# Patient Record
Sex: Male | Born: 1955 | Race: Black or African American | Hispanic: No | Smoking: Current every day smoker
Health system: Southern US, Community
[De-identification: ages and names within clinical notes are randomized; demographics above are authoritative.]

## PROBLEM LIST (undated history)

## (undated) DIAGNOSIS — L02413 Cutaneous abscess of right upper limb: Secondary | ICD-10-CM

## (undated) DIAGNOSIS — H3552 Pigmentary retinal dystrophy: Secondary | ICD-10-CM

## (undated) DIAGNOSIS — R0602 Shortness of breath: Secondary | ICD-10-CM

## (undated) DIAGNOSIS — C8598 Non-Hodgkin lymphoma, unspecified, lymph nodes of multiple sites: Secondary | ICD-10-CM

## (undated) HISTORY — DX: Cutaneous abscess of right upper limb: L02.413

## (undated) HISTORY — DX: Non-Hodgkin lymphoma, unspecified, lymph nodes of multiple sites: C85.98

## (undated) HISTORY — PX: OTHER SURGICAL HISTORY: SHX169

## (undated) HISTORY — DX: Shortness of breath: R06.02

## (undated) HISTORY — PX: TONSILLECTOMY: SUR1361

---

## 2015-10-24 ENCOUNTER — Encounter (HOSPITAL_COMMUNITY): Payer: Self-pay | Admitting: Family Medicine

## 2015-10-24 ENCOUNTER — Emergency Department (HOSPITAL_COMMUNITY): Payer: Medicare Other

## 2015-10-24 ENCOUNTER — Observation Stay (HOSPITAL_COMMUNITY)
Admission: EM | Admit: 2015-10-24 | Discharge: 2015-10-25 | Disposition: A | Discharge status: Home / Self Care | Payer: Medicare Other | Attending: Internal Medicine | Admitting: Internal Medicine

## 2015-10-24 DIAGNOSIS — R591 Generalized enlarged lymph nodes: Secondary | ICD-10-CM | POA: Diagnosis not present

## 2015-10-24 DIAGNOSIS — E8809 Other disorders of plasma-protein metabolism, not elsewhere classified: Secondary | ICD-10-CM | POA: Diagnosis not present

## 2015-10-24 DIAGNOSIS — R918 Other nonspecific abnormal finding of lung field: Secondary | ICD-10-CM | POA: Diagnosis not present

## 2015-10-24 DIAGNOSIS — F17200 Nicotine dependence, unspecified, uncomplicated: Secondary | ICD-10-CM | POA: Diagnosis not present

## 2015-10-24 DIAGNOSIS — H3552 Pigmentary retinal dystrophy: Secondary | ICD-10-CM | POA: Diagnosis not present

## 2015-10-24 DIAGNOSIS — Z7982 Long term (current) use of aspirin: Secondary | ICD-10-CM | POA: Insufficient documentation

## 2015-10-24 DIAGNOSIS — R748 Abnormal levels of other serum enzymes: Secondary | ICD-10-CM | POA: Diagnosis present

## 2015-10-24 DIAGNOSIS — N401 Enlarged prostate with lower urinary tract symptoms: Secondary | ICD-10-CM | POA: Diagnosis not present

## 2015-10-24 DIAGNOSIS — C8598 Non-Hodgkin lymphoma, unspecified, lymph nodes of multiple sites: Secondary | ICD-10-CM

## 2015-10-24 DIAGNOSIS — H548 Legal blindness, as defined in USA: Secondary | ICD-10-CM | POA: Diagnosis not present

## 2015-10-24 DIAGNOSIS — R109 Unspecified abdominal pain: Secondary | ICD-10-CM | POA: Diagnosis not present

## 2015-10-24 DIAGNOSIS — C801 Malignant (primary) neoplasm, unspecified: Secondary | ICD-10-CM

## 2015-10-24 DIAGNOSIS — R21 Rash and other nonspecific skin eruption: Secondary | ICD-10-CM | POA: Diagnosis not present

## 2015-10-24 DIAGNOSIS — R6881 Early satiety: Secondary | ICD-10-CM | POA: Insufficient documentation

## 2015-10-24 DIAGNOSIS — K4020 Bilateral inguinal hernia, without obstruction or gangrene, not specified as recurrent: Secondary | ICD-10-CM | POA: Diagnosis not present

## 2015-10-24 DIAGNOSIS — R339 Retention of urine, unspecified: Secondary | ICD-10-CM | POA: Diagnosis present

## 2015-10-24 DIAGNOSIS — R338 Other retention of urine: Secondary | ICD-10-CM | POA: Insufficient documentation

## 2015-10-24 DIAGNOSIS — R1010 Upper abdominal pain, unspecified: Secondary | ICD-10-CM | POA: Diagnosis not present

## 2015-10-24 DIAGNOSIS — R739 Hyperglycemia, unspecified: Secondary | ICD-10-CM | POA: Diagnosis not present

## 2015-10-24 DIAGNOSIS — K4090 Unilateral inguinal hernia, without obstruction or gangrene, not specified as recurrent: Secondary | ICD-10-CM

## 2015-10-24 HISTORY — DX: Pigmentary retinal dystrophy: H35.52

## 2015-10-24 LAB — COMPREHENSIVE METABOLIC PANEL
ALK PHOS: 84 U/L (ref 38–126)
ALT: 16 U/L — AB (ref 17–63)
AST: 24 U/L (ref 15–41)
Albumin: 2.9 g/dL — ABNORMAL LOW (ref 3.5–5.0)
Anion gap: 11 (ref 5–15)
BILIRUBIN TOTAL: 0.4 mg/dL (ref 0.3–1.2)
BUN: 11 mg/dL (ref 6–20)
CALCIUM: 9 mg/dL (ref 8.9–10.3)
CO2: 25 mmol/L (ref 22–32)
CREATININE: 1.1 mg/dL (ref 0.61–1.24)
Chloride: 101 mmol/L (ref 101–111)
GFR calc Af Amer: >60 mL/min (ref >60)
Glucose, Bld: 178 mg/dL — ABNORMAL HIGH (ref 65–99)
Potassium: 4.2 mmol/L (ref 3.5–5.1)
Sodium: 137 mmol/L (ref 135–145)
TOTAL PROTEIN: 6.2 g/dL — AB (ref 6.5–8.1)

## 2015-10-24 LAB — CBC
HCT: 40.9 % (ref 39.0–52.0)
Hemoglobin: 13.1 g/dL (ref 13.0–17.0)
MCH: 27.2 pg (ref 26.0–34.0)
MCHC: 32 g/dL (ref 30.0–36.0)
MCV: 84.9 fL (ref 78.0–100.0)
PLATELETS: 222 10*3/uL (ref 150–400)
RBC: 4.82 MIL/uL (ref 4.22–5.81)
RDW: 15.5 % (ref 11.5–15.5)
WBC: 9.1 10*3/uL (ref 4.0–10.5)

## 2015-10-24 LAB — URINALYSIS, ROUTINE W REFLEX MICROSCOPIC
BILIRUBIN URINE: NEGATIVE
Glucose, UA: NEGATIVE mg/dL
Hgb urine dipstick: NEGATIVE
KETONES UR: NEGATIVE mg/dL
LEUKOCYTES UA: NEGATIVE
NITRITE: NEGATIVE
PROTEIN: 30 mg/dL — AB
Specific Gravity, Urine: 1.024 (ref 1.005–1.030)
pH: 5 (ref 5.0–8.0)

## 2015-10-24 LAB — URINE MICROSCOPIC-ADD ON

## 2015-10-24 LAB — LIPASE, BLOOD: Lipase: 88 U/L — ABNORMAL HIGH (ref 11–51)

## 2015-10-24 MED ORDER — ONDANSETRON HCL 4 MG PO TABS: 4.0000 mg | ORAL_TABLET | Freq: Four times a day (QID) | ORAL | Status: DC | PRN

## 2015-10-24 MED ORDER — IOHEXOL 300 MG/ML  SOLN
100.0000 mL | Freq: Once | INTRAMUSCULAR | Status: AC | PRN
  Administered 2015-10-24: 100 mL via INTRAVENOUS

## 2015-10-24 MED ORDER — SODIUM CHLORIDE 0.9 % IV SOLN: INTRAVENOUS | Status: DC

## 2015-10-24 MED ORDER — MORPHINE SULFATE (PF) 2 MG/ML IV SOLN: 1.0000 mg | INTRAVENOUS | Status: DC | PRN

## 2015-10-24 MED ORDER — INSULIN ASPART 100 UNIT/ML ~~LOC~~ SOLN: 0.0000 [IU] | SUBCUTANEOUS | Status: DC

## 2015-10-24 MED ORDER — HEPARIN SODIUM (PORCINE) 5000 UNIT/ML IJ SOLN: 5000.0000 [IU] | Freq: Three times a day (TID) | INTRAMUSCULAR | Status: DC

## 2015-10-24 MED ORDER — IOHEXOL 300 MG/ML  SOLN
75.0000 mL | Freq: Once | INTRAMUSCULAR | Status: AC | PRN
  Administered 2015-10-24: 75 mL via INTRAVENOUS

## 2015-10-24 MED ORDER — ACETAMINOPHEN 650 MG RE SUPP
650.0000 mg | Freq: Four times a day (QID) | RECTAL | Status: DC | PRN
Start: 2015-10-24 — End: 2015-10-25

## 2015-10-24 MED ORDER — ONDANSETRON HCL 4 MG/2ML IJ SOLN: 4.0000 mg | Freq: Four times a day (QID) | INTRAMUSCULAR | Status: DC | PRN

## 2015-10-24 MED ORDER — ACETAMINOPHEN 325 MG PO TABS: 650.0000 mg | ORAL_TABLET | Freq: Four times a day (QID) | ORAL | Status: DC | PRN

## 2015-10-24 MED ORDER — SODIUM CHLORIDE 0.9 % IV BOLUS (SEPSIS)
500.0000 mL | Freq: Once | INTRAVENOUS | Status: AC
  Administered 2015-10-25: 500 mL via INTRAVENOUS

## 2015-10-24 NOTE — ED Notes (Signed)
Patient transported to CT 

## 2015-10-24 NOTE — Progress Notes (Addendum)
[facility] consulted to assist with pcp issue.  Patient listed as having Medicare insurance without a pcp.  [facility] faxed list of pcps to [facility] ED at [phone], who accept Medicare insurance within a 5 mile radius of patient's zip code with confirmation of receipt.  Also sent email to cancer center requesting appointment ASAP.  No further EDCM needs at this time.

## 2015-10-24 NOTE — H&P (Addendum)
[facility] History and Physical  AGNEW ZINCK [id number] DOB: [birth year] DOA: 10/24/2015  Referring physician: ED PCP: No PCP Per Patient   Chief Complaint: Abdominal pain  HPI:  Mr. Kristopher Johnson is a 60 year old male with no significant past medical history as he has not seen a doctor in years; presents with the complaints of abdominal pain, difficulty urinating, and rash. Symptoms started sometime around Thanksgiving. Notes having early satiety and discomfort in the upper abdomen. Along the same time that the symptoms occurred he noted having a rash showed up along his right arm and back. Notes trying to clean the area in efforts to get it to heal, but it has not done so. Associated symptoms include swelling in the right side of scrotum area and difficulty with urination. He complains that he has to strain in order to initiate urination. Also reports never feeling like he is completely emptying his bladder but declines having a urinary cath being placed. Intermittently has a sharp pain that runs down the right inguinal area. Complains the right inguinal intermittently gets hard and he has to push it back in. Denies any fevers, chills, chest pain, shortness of breath, or diarrhea. Over the last few months he has lost significant amount of weight. He reports weighing around 210 pounds but currently is sitting somewhere around 185. Patient denies any IV drug, alcohol, or tobacco use.   Upon admission into the emergency department, initial labwork was unremarkable. CT scan of the abdomen revealed multiple areas of enlarge lymph nodes suggestive of lymphoma. ED physician consulted oncology who recommended admission as the lymphoma could be aggressive in nature and would like to have biopsy done in the a.m. if possible.  Review of Systems  Constitutional: Positive for weight loss and malaise/fatigue.  HENT: Negative for hearing loss and tinnitus.   Eyes: Negative for double vision and  photophobia.  Respiratory: Negative for hemoptysis and shortness of breath.   Cardiovascular: Negative for chest pain and palpitations.  Gastrointestinal: Positive for abdominal pain.  Genitourinary: Negative for urgency and hematuria.       Difficulty starting a stream  Musculoskeletal: Negative for joint pain and falls.  Skin: Positive for rash.  Neurological: Negative for speech change and focal weakness.  Endo/Heme/Allergies: Negative for environmental allergies. Does not bruise/bleed easily.      History reviewed. No pertinent past medical history.   Past Surgical History  Procedure Laterality Date  . Tonsillectomy        Social History:  reports that he has been smoking.  He does not have any smokeless tobacco history on file. He reports that he does not drink alcohol. His drug history is not on file.   No Known Allergies  History reviewed. No pertinent family history.     Prior to Admission medications   Medication Sig Start Date End Date Taking? Authorizing Provider  aspirin EC 325 MG tablet Take 325 mg by mouth every 6 (six) hours as needed for mild pain.   Yes Historical Provider, MD     Physical Exam: Filed Vitals:   10/24/15 1655 10/24/15 2214 10/24/15 2215 10/24/15 2230  BP: 111/66 110/87 122/68 110/69  Pulse: 106 102 107 110  Temp:      TempSrc:      Resp: 18     Height:      Weight:      SpO2: 97% 95% 95% 97%     Constitutional: Vital signs reviewed. Patient is a male who is ill appearing, but  in no acute distress and cooperative with exam. Alert and oriented x3.  Head: Normocephalic and atraumatic  Ear: TM normal bilaterally  Mouth: no erythema or exudates, MMM  Eyes: PERRL, EOMI, conjunctivae normal, No scleral icterus.  Neck: Supple, Trachea midline normal ROM, No JVD, mass, thyromegaly, or carotid bruit present.  Cardiovascular: Tachycardic S1 normal, S2 normal, no MRG, pulses symmetric and intact bilaterally  Pulmonary/Chest: CTAB, no  wheezes, rales, or rhonchi  Abdominal: Soft. Positive tenderness of the epigastric and right upper quadrant region GU: Positive for right inguinal hernia larger than left inguinal hernia.  Musculoskeletal: No joint deformities, erythema, or stiffness, ROM full and no nontender Ext: no edema and no cyanosis, pulses palpable bilaterally (DP and PT)  Hematology: Positive inguinal and supraclavicular adenopathy  Neurological: A&O x3, Strenght is normal and symmetric bilaterally, cranial nerve II-XII are grossly intact, no focal motor deficit, sensory intact to light touch bilaterally.  Skin: Positive papular rash with crusted over lesions of the right arm and back . No cyanosis or clubbing.  Psychiatric: Normal mood and affect. speech and behavior is normal. Judgment and thought content normal. Cognition and memory are normal.      Data Review   Micro Results No results found for this or any previous visit (from the past 240 hour(s)).  Radiology Reports Ct Soft Tissue Neck W Contrast  10/24/2015  CLINICAL DATA:  Concerning for lymphoma. Abdominal pain, urinary retention. Imaging today demonstrates possible lymphoma. EXAM: CT NECK AND CHEST WITHOUT CONTRAST COMPARISON:  None. FINDINGS: CT NECK FINDINGS (limited assessment without contrast) Pharynx and larynx: Normal. Salivary glands: Normal. Thyroid: Probable lymphadenopathy versus thyromegaly, difficult to discern without contrast. Lymph nodes: Lymphadenopathy along the cervical changing, including 14 mm short access RIGHT level IIa lymph node, multiple presumed lymph nodes level 3 and 4. Probable LEFT supraclavicular lymphadenopathy though, difficult to differentiate from vascular structures. Vascular: Limited assessment. Limited intracranial: Normal. Visualized orbits: Abnormal soft tissue within the retrobulbar fat at the optic nerve insertion, LEFT greater than RIGHT. The ocular globes are intact, status post bilateral ocular lens implants.  Extraocular muscles are unremarkable. Preservation of orbital fat. Mastoids and visualized paranasal sinuses: Well aerated. Skeleton: No destructive bony lesions. Mild to moderate degenerative change of the cervical spine. CT CHEST FINDINGS Mediastinum/Lymph Nodes: Mediastinal lymphadenopathy, including LEFT peritracheal at least 22 mm nodal conglomeration. Bilateral presumed hilar lymphadenopathy. Internal mammary chain lymphadenopathy on the RIGHT measuring up to 14 mm. Bulky bilateral axillary lymphadenopathy measuring up to 28 mm short access. 19 mm short access cardio phrenic angle lymph node. Heart size normal. No pericardial effusions. Lungs/Pleura: Innumerable, diffuse subcentimeter centrilobular nodules and tree-in-bud infiltrates. Small bilateral pleural effusions with compressive atelectasis. Tracheobronchial tree is patent and midline. No pneumothorax. Musculoskeletal: No chest wall mass or suspicious bone lesions identified. IMPRESSION: CT NECK: Limited non contrast assessment. Extensive lymphadenopathy compatible with lymphoma, difficult to differentiate from underlying structures due to lack of contrast. Patent airway. Bilateral orbital retrobulbar masses most consistent with lymphoma. This would be better characterized on MRI of the orbits with contrast on a nonemergent basis. CT CHEST: Limited noncontrast assessment. Extensive lymphadenopathy consistent with lymphoma. Diffuse centrilobular nodules and tree-in-bud infiltrates most consistent with lymphangitic spread of lymphoma though, and infection could have a similar appearance. Small bilateral pleural effusions are likely malignant. Electronically Signed   By: Elon Alas M.D.   On: 10/24/2015 22:17   Ct Chest W Contrast  10/24/2015  CLINICAL DATA:  Concerning for lymphoma. Abdominal pain, urinary retention. Imaging  today demonstrates possible lymphoma. EXAM: CT NECK AND CHEST WITHOUT CONTRAST COMPARISON:  None. FINDINGS: CT NECK  FINDINGS (limited assessment without contrast) Pharynx and larynx: Normal. Salivary glands: Normal. Thyroid: Probable lymphadenopathy versus thyromegaly, difficult to discern without contrast. Lymph nodes: Lymphadenopathy along the cervical changing, including 14 mm short access RIGHT level IIa lymph node, multiple presumed lymph nodes level 3 and 4. Probable LEFT supraclavicular lymphadenopathy though, difficult to differentiate from vascular structures. Vascular: Limited assessment. Limited intracranial: Normal. Visualized orbits: Abnormal soft tissue within the retrobulbar fat at the optic nerve insertion, LEFT greater than RIGHT. The ocular globes are intact, status post bilateral ocular lens implants. Extraocular muscles are unremarkable. Preservation of orbital fat. Mastoids and visualized paranasal sinuses: Well aerated. Skeleton: No destructive bony lesions. Mild to moderate degenerative change of the cervical spine. CT CHEST FINDINGS Mediastinum/Lymph Nodes: Mediastinal lymphadenopathy, including LEFT peritracheal at least 22 mm nodal conglomeration. Bilateral presumed hilar lymphadenopathy. Internal mammary chain lymphadenopathy on the RIGHT measuring up to 14 mm. Bulky bilateral axillary lymphadenopathy measuring up to 28 mm short access. 19 mm short access cardio phrenic angle lymph node. Heart size normal. No pericardial effusions. Lungs/Pleura: Innumerable, diffuse subcentimeter centrilobular nodules and tree-in-bud infiltrates. Small bilateral pleural effusions with compressive atelectasis. Tracheobronchial tree is patent and midline. No pneumothorax. Musculoskeletal: No chest wall mass or suspicious bone lesions identified. IMPRESSION: CT NECK: Limited non contrast assessment. Extensive lymphadenopathy compatible with lymphoma, difficult to differentiate from underlying structures due to lack of contrast. Patent airway. Bilateral orbital retrobulbar masses most consistent with lymphoma. This would be  better characterized on MRI of the orbits with contrast on a nonemergent basis. CT CHEST: Limited noncontrast assessment. Extensive lymphadenopathy consistent with lymphoma. Diffuse centrilobular nodules and tree-in-bud infiltrates most consistent with lymphangitic spread of lymphoma though, and infection could have a similar appearance. Small bilateral pleural effusions are likely malignant. Electronically Signed   By: Elon Alas M.D.   On: 10/24/2015 22:17   Ct Abdomen Pelvis W Contrast  10/24/2015  CLINICAL DATA:  Upper abdominal pain and bloating for 2 months. EXAM: CT ABDOMEN AND PELVIS WITH CONTRAST TECHNIQUE: Multidetector CT imaging of the abdomen and pelvis was performed using the standard protocol following bolus administration of intravenous contrast. CONTRAST:  130mL OMNIPAQUE IOHEXOL 300 MG/ML  SOLN COMPARISON:  None. FINDINGS: Lower chest: Nodularity along the bronchovascular bundles in both lower lobes could be lymphoma in his involvement of the lungs. There are bilateral pleural effusions and overlying atelectasis. There is also a 17 mm epicardial lymph node on image 9. No pericardial effusion. Small pleural nodules are also noted. Retrocrural adenopathy is also noted. Hepatobiliary: Simple appearing hepatic cyst in the left lobe. No worrisome hepatic lesions or intrahepatic biliary dilatation. The gallbladder is unremarkable. No common bile duct dilatation. Pancreas: No mass, inflammation or ductal dilatation. Spleen: Normal size.  No focal lesions. Adrenals/Urinary Tract: The adrenal glands are grossly normal. There is a thick rim soft tissue thickening along the posterior aspect of the left kidney which could be lymphomatous involvement. A left renal calculus is also noted. There is also a a thick rim of soft tissue density along the left renal pelvis extending down along the inferior margin of the kidney which is likely lymphoma. This measures a maximum of 3.5 cm on image number 41.  Stomach/Bowel: The stomach is markedly thickened and somewhat nodular and irregular. Findings suspicious for gastric lymphoma. The duodenum, small bowel and colon are grossly normal. No obvious inflammatory changes or mass lesions. No  obstructive findings. The terminal ileum is normal. The appendix is normal. Vascular/Lymphatic: Bulky mesenteric and retroperitoneal lymphadenopathy. Large upper abdominal mesenteric mass measures 12 x 7.5 cm on image number 22. There is also bulky retroperitoneal adenopathy with an index node between the aorta and left kidney measuring 30 x 24 mm on image number 32. There is also bilateral pelvic and inguinal lymphadenopathy. An index right external iliac lymph node on image number 69 measures a maximum of 21 mm short axis. Right inguinal lymph node measures 27 x 16 mm on image number 87. The aorta is normal in caliber. The branch vessels are patent. The major venous structures are patent. Mild compression of the IVC by the bulky adenopathy. Other: The bladder is normal. The prostate gland is mildly enlarged with median lobe hypertrophy impressing on the base of the bladder. The seminal vesicles are normal. No free pelvic fluid collections. Bilateral inguinal hernias containing small bowel loops. Musculoskeletal: No obvious changes of osseous lymphoma. IMPRESSION: 1. Constellation of findings most consistent with lymphoma. 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 2. Suspect lymphomatous involvement of the lungs. There also bilateral pleural effusions and overlying atelectasis. No obvious involvement of the liver or spleen. Bilateral inguinal hernias containing small bowel loops. Electronically Signed   By: Marijo Sanes M.D.   On: 10/24/2015 19:15     CBC  Recent Labs Lab 10/24/15 1426  WBC 9.1  HGB 13.1  HCT 40.9  PLT  222  MCV 84.9  MCH 27.2  MCHC 32.0  RDW 15.5    Chemistries   Recent Labs Lab 10/24/15 1426  NA 137  K 4.2  CL 101  CO2 25  GLUCOSE 178*  BUN 11  CREATININE 1.10  CALCIUM 9.0  AST 24  ALT 16*  ALKPHOS 84  BILITOT 0.4   ------------------------------------------------------------------------------------------------------------------ estimated creatinine clearance is 70.5 mL/min (by C-G formula based on Cr of 1.1). ------------------------------------------------------------------------------------------------------------------ No results for input(s): HGBA1C in the last 72 hours. ------------------------------------------------------------------------------------------------------------------ No results for input(s): CHOL, HDL, LDLCALC, TRIG, CHOLHDL, LDLDIRECT in the last 72 hours. ------------------------------------------------------------------------------------------------------------------ No results for input(s): TSH, T4TOTAL, T3FREE, THYROIDAB in the last 72 hours.  Invalid input(s): FREET3 ------------------------------------------------------------------------------------------------------------------ No results for input(s): VITAMINB12, FOLATE, FERRITIN, TIBC, IRON, RETICCTPCT in the last 72 hours.  Coagulation profile No results for input(s): INR, PROTIME in the last 168 hours.  No results for input(s): DDIMER in the last 72 hours.  Cardiac Enzymes No results for input(s): CKMB, TROPONINI, MYOGLOBIN in the last 168 hours.  Invalid input(s): CK ------------------------------------------------------------------------------------------------------------------ Invalid input(s): POCBNP   CBG: No results for input(s): GLUCAP in the last 168 hours.     EKG:   Assessment/Plan Principal Problem:     Lymphadenopathy: Acute. Suspected lymphoma. Patient reports generalized feelings of early satiety with abdominal pain. Physical exam patient has  lymphadenopathy and  tenderness palpation of the epigastric region. CT scan was suggestive of lymphoma. Oncology consulted and recommended observation inpatient admission for need of biopsy. - Admit to a MedSurg bed - checking LDH and uric acid - NPO after midnight for possible biopsy in am - IV fluids normal saline at 125 ml/hr  - Follow-up oncology recommendations in a.m.  Hypoalbuminemia: Albumin noted to be decreased at 2.8 on admission. - prealbumin    Elevated lipase: Initial lipase 88 on admission.   - IVF fluids as seen above   Inguinal hernia: CT scan showed bilateral inguinal  hernias most notably on the right - consider surgery evaluation as outpatient   Urinary retention/BPH: Acute. - Check bladder scan if patient retaining more than 300 mL of urine okay to place foley - Would start Flomax at some point   Code Status:   full Family Communication: bedside Disposition Plan: admit   Total time spent 55 minutes.Greater than 50% of this time was spent in counseling, explanation of diagnosis, planning of further management, and coordination of care  [facility] Pager [phone]  If 7PM-7AM, please contact night-coverage [email or web address] Password [email or web address] 10/24/2015, 10:40 PM

## 2015-10-24 NOTE — ED Provider Notes (Signed)
CSN: [id number]     Arrival date & time 10/24/15  1249 History   First MD Initiated Contact with Patient 10/24/15 1555     Chief Complaint  Patient presents with  . Abdominal Pain  . Urinary Retention  . Rash     (Consider location/radiation/quality/duration/timing/severity/associated sxs/prior Treatment) HPI Comments: 60 year old male with no significant past medical history who has not seen a physician in extended period of time presents for abdominal pain, difficulty initiating urination, rash for the last 2 months. Patient reports that he has had no fullness and discomfort in his upper abdomen since after Thanksgiving. He says it feels like he would feel after you ate a big Thanksgiving meal but the symptoms have persisted and have not gotten any better. He denies any nausea or vomiting. He does report some decreased appetite. He denies any history of any abdominal surgeries. He said he decided to come in today because his symptoms just did not go away.  He also reports that when he urinates he feels like he has to force himself. He says that it is very difficult to initiate but he does feel like he can empty his bladder. Denies fever or chills. He also has noticed a rash on his right arm. He says he been trying to treat it with water and cleaning the arm but is not gone away. He said it's just these little scabs up and down his arm. Denies drug use, alcohol abuse, tobacco abuse.  Patient is a 60 y.o. male presenting with abdominal pain and rash.  Abdominal Pain Associated symptoms: no chest pain, no constipation, no cough, no diarrhea, no dysuria, no fatigue, no fever, no hematuria, no nausea, no shortness of breath and no vomiting   Rash Associated symptoms: abdominal pain   Associated symptoms: no diarrhea, no fatigue, no fever, no headaches, no myalgias, no nausea, no shortness of breath and not vomiting     History reviewed. No pertinent past medical history. Past Surgical History   Procedure Laterality Date  . Tonsillectomy     History reviewed. No pertinent family history. Social History  Substance Use Topics  . Smoking status: Current Every Day Smoker  . Smokeless tobacco: None  . Alcohol Use: No    Review of Systems  Constitutional: Positive for appetite change. Negative for fever and fatigue.  HENT: Negative for congestion, postnasal drip and rhinorrhea.   Eyes: Negative for pain and redness.  Respiratory: Negative for cough, chest tightness and shortness of breath.   Cardiovascular: Negative for chest pain and palpitations.  Gastrointestinal: Positive for abdominal pain. Negative for nausea, vomiting, diarrhea and constipation.  Genitourinary: Negative for dysuria, urgency, frequency and hematuria.  Musculoskeletal: Negative for myalgias and back pain.  Skin: Positive for rash.  Neurological: Negative for dizziness, seizures, weakness, numbness and headaches.  Hematological: Does not bruise/bleed easily.      Allergies  Review of patient's allergies indicates no known allergies.  Home Medications   Prior to Admission medications   Medication Sig Start Date End Date Taking? Authorizing Provider  aspirin EC 325 MG tablet Take 325 mg by mouth every 6 (six) hours as needed for mild pain.   Yes Historical Provider, MD   BP 120/61 mmHg  Pulse 106  Temp(Src) 98.2 F (36.8 C) (Oral)  Resp 12  Ht 5\' 9"  (1.753 m)  Wt 185 lb (83.915 kg)  BMI 27.31 kg/m2  SpO2 95% Physical Exam  Constitutional: He is oriented to person, place, and time. He appears  well-developed and well-nourished. No distress.  HENT:  Head: Normocephalic and atraumatic.  Right Ear: External ear normal.  Left Ear: External ear normal.  Mouth/Throat: Oropharynx is clear and moist. No oropharyngeal exudate.  Eyes: EOM are normal. Pupils are equal, round, and reactive to light.  Neck: Normal range of motion. Neck supple.  Cardiovascular: Regular rhythm, normal heart sounds and  intact distal pulses.  Tachycardia present.   No murmur heard. Pulmonary/Chest: Effort normal. No respiratory distress. He has no wheezes. He has no rales.  Abdominal: Soft. He exhibits no distension. There is tenderness (mild) in the right upper quadrant and epigastric area. There is no rigidity and no guarding.  Musculoskeletal: He exhibits no edema.  Neurological: He is alert and oriented to person, place, and time.  Skin: Skin is warm and dry. No rash noted. He is not diaphoretic.  Vitals reviewed.   ED Course  Procedures (including critical care time) Labs Review Labs Reviewed  LIPASE, BLOOD - Abnormal; Notable for the following:    Lipase 88 (*)    All other components within normal limits  COMPREHENSIVE METABOLIC PANEL - Abnormal; Notable for the following:    Glucose, Bld 178 (*)    Total Protein 6.2 (*)    Albumin 2.9 (*)    ALT 16 (*)    All other components within normal limits  URINALYSIS, ROUTINE W REFLEX MICROSCOPIC (NOT AT [facility]) - Abnormal; Notable for the following:    APPearance CLOUDY (*)    Protein, ur 30 (*)    All other components within normal limits  URINE MICROSCOPIC-ADD ON - Abnormal; Notable for the following:    Squamous Epithelial / LPF 0-5 (*)    Bacteria, UA RARE (*)    Casts HYALINE CASTS (*)    All other components within normal limits  CBC WITH DIFFERENTIAL/PLATELET - Abnormal; Notable for the following:    Hemoglobin 12.2 (*)    HCT 38.3 (*)    RDW 15.6 (*)    Monocytes Absolute 1.4 (*)    Basophils Absolute 0.5 (*)    All other components within normal limits  CBC  GLUCOSE, CAPILLARY  LACTATE DEHYDROGENASE  URIC ACID  HEMOGLOBIN A1C  APTT  PROTIME-INR  CBC    Imaging Review Ct Soft Tissue Neck W Contrast  10/24/2015  CLINICAL DATA:  Concerning for lymphoma. Abdominal pain, urinary retention. Imaging today demonstrates possible lymphoma. EXAM: CT NECK AND CHEST WITHOUT CONTRAST COMPARISON:  None. FINDINGS: CT NECK FINDINGS (limited  assessment without contrast) Pharynx and larynx: Normal. Salivary glands: Normal. Thyroid: Probable lymphadenopathy versus thyromegaly, difficult to discern without contrast. Lymph nodes: Lymphadenopathy along the cervical changing, including 14 mm short access RIGHT level IIa lymph node, multiple presumed lymph nodes level 3 and 4. Probable LEFT supraclavicular lymphadenopathy though, difficult to differentiate from vascular structures. Vascular: Limited assessment. Limited intracranial: Normal. Visualized orbits: Abnormal soft tissue within the retrobulbar fat at the optic nerve insertion, LEFT greater than RIGHT. The ocular globes are intact, status post bilateral ocular lens implants. Extraocular muscles are unremarkable. Preservation of orbital fat. Mastoids and visualized paranasal sinuses: Well aerated. Skeleton: No destructive bony lesions. Mild to moderate degenerative change of the cervical spine. CT CHEST FINDINGS Mediastinum/Lymph Nodes: Mediastinal lymphadenopathy, including LEFT peritracheal at least 22 mm nodal conglomeration. Bilateral presumed hilar lymphadenopathy. Internal mammary chain lymphadenopathy on the RIGHT measuring up to 14 mm. Bulky bilateral axillary lymphadenopathy measuring up to 28 mm short access. 19 mm short access cardio phrenic angle lymph node.  Heart size normal. No pericardial effusions. Lungs/Pleura: Innumerable, diffuse subcentimeter centrilobular nodules and tree-in-bud infiltrates. Small bilateral pleural effusions with compressive atelectasis. Tracheobronchial tree is patent and midline. No pneumothorax. Musculoskeletal: No chest wall mass or suspicious bone lesions identified. IMPRESSION: CT NECK: Limited non contrast assessment. Extensive lymphadenopathy compatible with lymphoma, difficult to differentiate from underlying structures due to lack of contrast. Patent airway. Bilateral orbital retrobulbar masses most consistent with lymphoma. This would be better  characterized on MRI of the orbits with contrast on a nonemergent basis. CT CHEST: Limited noncontrast assessment. Extensive lymphadenopathy consistent with lymphoma. Diffuse centrilobular nodules and tree-in-bud infiltrates most consistent with lymphangitic spread of lymphoma though, and infection could have a similar appearance. Small bilateral pleural effusions are likely malignant. Electronically Signed   By: Elon Alas M.D.   On: 10/24/2015 22:17   Ct Chest W Contrast  10/24/2015  CLINICAL DATA:  Concerning for lymphoma. Abdominal pain, urinary retention. Imaging today demonstrates possible lymphoma. EXAM: CT NECK AND CHEST WITHOUT CONTRAST COMPARISON:  None. FINDINGS: CT NECK FINDINGS (limited assessment without contrast) Pharynx and larynx: Normal. Salivary glands: Normal. Thyroid: Probable lymphadenopathy versus thyromegaly, difficult to discern without contrast. Lymph nodes: Lymphadenopathy along the cervical changing, including 14 mm short access RIGHT level IIa lymph node, multiple presumed lymph nodes level 3 and 4. Probable LEFT supraclavicular lymphadenopathy though, difficult to differentiate from vascular structures. Vascular: Limited assessment. Limited intracranial: Normal. Visualized orbits: Abnormal soft tissue within the retrobulbar fat at the optic nerve insertion, LEFT greater than RIGHT. The ocular globes are intact, status post bilateral ocular lens implants. Extraocular muscles are unremarkable. Preservation of orbital fat. Mastoids and visualized paranasal sinuses: Well aerated. Skeleton: No destructive bony lesions. Mild to moderate degenerative change of the cervical spine. CT CHEST FINDINGS Mediastinum/Lymph Nodes: Mediastinal lymphadenopathy, including LEFT peritracheal at least 22 mm nodal conglomeration. Bilateral presumed hilar lymphadenopathy. Internal mammary chain lymphadenopathy on the RIGHT measuring up to 14 mm. Bulky bilateral axillary lymphadenopathy measuring up  to 28 mm short access. 19 mm short access cardio phrenic angle lymph node. Heart size normal. No pericardial effusions. Lungs/Pleura: Innumerable, diffuse subcentimeter centrilobular nodules and tree-in-bud infiltrates. Small bilateral pleural effusions with compressive atelectasis. Tracheobronchial tree is patent and midline. No pneumothorax. Musculoskeletal: No chest wall mass or suspicious bone lesions identified. IMPRESSION: CT NECK: Limited non contrast assessment. Extensive lymphadenopathy compatible with lymphoma, difficult to differentiate from underlying structures due to lack of contrast. Patent airway. Bilateral orbital retrobulbar masses most consistent with lymphoma. This would be better characterized on MRI of the orbits with contrast on a nonemergent basis. CT CHEST: Limited noncontrast assessment. Extensive lymphadenopathy consistent with lymphoma. Diffuse centrilobular nodules and tree-in-bud infiltrates most consistent with lymphangitic spread of lymphoma though, and infection could have a similar appearance. Small bilateral pleural effusions are likely malignant. Electronically Signed   By: Elon Alas M.D.   On: 10/24/2015 22:17   Ct Abdomen Pelvis W Contrast  10/24/2015  CLINICAL DATA:  Upper abdominal pain and bloating for 2 months. EXAM: CT ABDOMEN AND PELVIS WITH CONTRAST TECHNIQUE: Multidetector CT imaging of the abdomen and pelvis was performed using the standard protocol following bolus administration of intravenous contrast. CONTRAST:  177mL OMNIPAQUE IOHEXOL 300 MG/ML  SOLN COMPARISON:  None. FINDINGS: Lower chest: Nodularity along the bronchovascular bundles in both lower lobes could be lymphoma in his involvement of the lungs. There are bilateral pleural effusions and overlying atelectasis. There is also a 17 mm epicardial lymph node on image 9. No pericardial effusion.  Small pleural nodules are also noted. Retrocrural adenopathy is also noted. Hepatobiliary: Simple appearing  hepatic cyst in the left lobe. No worrisome hepatic lesions or intrahepatic biliary dilatation. The gallbladder is unremarkable. No common bile duct dilatation. Pancreas: No mass, inflammation or ductal dilatation. Spleen: Normal size.  No focal lesions. Adrenals/Urinary Tract: The adrenal glands are grossly normal. There is a thick rim soft tissue thickening along the posterior aspect of the left kidney which could be lymphomatous involvement. A left renal calculus is also noted. There is also a a thick rim of soft tissue density along the left renal pelvis extending down along the inferior margin of the kidney which is likely lymphoma. This measures a maximum of 3.5 cm on image number 41. Stomach/Bowel: The stomach is markedly thickened and somewhat nodular and irregular. Findings suspicious for gastric lymphoma. The duodenum, small bowel and colon are grossly normal. No obvious inflammatory changes or mass lesions. No obstructive findings. The terminal ileum is normal. The appendix is normal. Vascular/Lymphatic: Bulky mesenteric and retroperitoneal lymphadenopathy. Large upper abdominal mesenteric mass measures 12 x 7.5 cm on image number 22. There is also bulky retroperitoneal adenopathy with an index node between the aorta and left kidney measuring 30 x 24 mm on image number 32. There is also bilateral pelvic and inguinal lymphadenopathy. An index right external iliac lymph node on image number 69 measures a maximum of 21 mm short axis. Right inguinal lymph node measures 27 x 16 mm on image number 87. The aorta is normal in caliber. The branch vessels are patent. The major venous structures are patent. Mild compression of the IVC by the bulky adenopathy. Other: The bladder is normal. The prostate gland is mildly enlarged with median lobe hypertrophy impressing on the base of the bladder. The seminal vesicles are normal. No free pelvic fluid collections. Bilateral inguinal hernias containing small bowel loops.  Musculoskeletal: No obvious changes of osseous lymphoma. IMPRESSION: 1. Constellation of findings most consistent with lymphoma. 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 2. Suspect lymphomatous involvement of the lungs. There also bilateral pleural effusions and overlying atelectasis. No obvious involvement of the liver or spleen. Bilateral inguinal hernias containing small bowel loops. Electronically Signed   By: Marijo Sanes M.D.   On: 10/24/2015 19:15   I have personally reviewed and evaluated these images and lab results as part of my medical decision-making.   EKG Interpretation None      MDM  Patient was seen and evaluated in stable condition. Labs with hyperglycemia but otherwise unremarkable. CT abdomen and pelvis consistent with likely lymphoma. Discussed case with oncologist on-call. He said with the length of time patient has had symptoms and how extensive the findings were on CT he recommended that the patient be brought inpatient for quicker workup and treatment. He also recommended checking a uric acid as well as an LDH. Case was discussed with Dr. Tamala Julian from [facility] who agreed with admission. Patient was admitted under his care with oncology to see the patient first thing in the morning. Possible plan for surgery consult inpatient for lymph node biopsy. Patient was updated on all results and plan of care. Final diagnoses:  Lymphadenopathy  Cancer ([place])    1. Lymphadenopathy, likely lymphoma       Harvel Quale, MD 10/25/15 [phone]

## 2015-10-24 NOTE — ED Notes (Signed)
MD at bedside at this time.

## 2015-10-24 NOTE — ED Notes (Signed)
Pt voids 100 cc dark yellow urine to urinal.

## 2015-10-24 NOTE — ED Notes (Signed)
Pt here for upper abd pain, rash and urinary retention since after thanksgiving.

## 2015-10-24 NOTE — ED Notes (Signed)
Patient undressed, in gown, continuous pulse oximetry and blood pressure cuff

## 2015-10-25 ENCOUNTER — Other Ambulatory Visit: Payer: Medicare Other | Admitting: Hematology

## 2015-10-25 ENCOUNTER — Observation Stay (HOSPITAL_COMMUNITY): Payer: Medicare Other

## 2015-10-25 ENCOUNTER — Encounter (HOSPITAL_COMMUNITY): Payer: Self-pay | Admitting: Hematology

## 2015-10-25 ENCOUNTER — Telehealth: Payer: Self-pay | Admitting: Hematology and Oncology

## 2015-10-25 DIAGNOSIS — R591 Generalized enlarged lymph nodes: Secondary | ICD-10-CM

## 2015-10-25 DIAGNOSIS — H3552 Pigmentary retinal dystrophy: Secondary | ICD-10-CM | POA: Diagnosis not present

## 2015-10-25 DIAGNOSIS — R140 Abdominal distension (gaseous): Secondary | ICD-10-CM

## 2015-10-25 DIAGNOSIS — R21 Rash and other nonspecific skin eruption: Secondary | ICD-10-CM

## 2015-10-25 DIAGNOSIS — R109 Unspecified abdominal pain: Secondary | ICD-10-CM | POA: Insufficient documentation

## 2015-10-25 DIAGNOSIS — R339 Retention of urine, unspecified: Secondary | ICD-10-CM | POA: Diagnosis present

## 2015-10-25 DIAGNOSIS — E8809 Other disorders of plasma-protein metabolism, not elsewhere classified: Secondary | ICD-10-CM | POA: Diagnosis present

## 2015-10-25 DIAGNOSIS — R748 Abnormal levels of other serum enzymes: Secondary | ICD-10-CM | POA: Diagnosis present

## 2015-10-25 DIAGNOSIS — R590 Localized enlarged lymph nodes: Secondary | ICD-10-CM | POA: Diagnosis not present

## 2015-10-25 DIAGNOSIS — L298 Other pruritus: Secondary | ICD-10-CM

## 2015-10-25 DIAGNOSIS — Z720 Tobacco use: Secondary | ICD-10-CM

## 2015-10-25 DIAGNOSIS — K4090 Unilateral inguinal hernia, without obstruction or gangrene, not specified as recurrent: Secondary | ICD-10-CM

## 2015-10-25 DIAGNOSIS — C8515 Unspecified B-cell lymphoma, lymph nodes of inguinal region and lower limb: Secondary | ICD-10-CM | POA: Diagnosis not present

## 2015-10-25 LAB — APTT: APTT: 33 s (ref 24–37)

## 2015-10-25 LAB — PREALBUMIN: PREALBUMIN: 11.9 mg/dL — AB (ref 18–38)

## 2015-10-25 LAB — GLUCOSE, CAPILLARY
GLUCOSE-CAPILLARY: 79 mg/dL (ref 65–99)
Glucose-Capillary: 81 mg/dL (ref 65–99)
Glucose-Capillary: 79 mg/dL (ref 65–99)
Glucose-Capillary: 70 mg/dL (ref 65–99)

## 2015-10-25 LAB — CBC WITH DIFFERENTIAL/PLATELET
BASOS PCT: 5 %
Basophils Absolute: 0.5 10*3/uL — ABNORMAL HIGH (ref 0.0–0.1)
Eosinophils Absolute: 0.5 10*3/uL (ref 0.0–0.7)
Eosinophils Relative: 5 %
HEMATOCRIT: 38.3 % — AB (ref 39.0–52.0)
Hemoglobin: 12.2 g/dL — ABNORMAL LOW (ref 13.0–17.0)
Lymphocytes Relative: 15 %
Lymphs Abs: 1.5 10*3/uL (ref 0.7–4.0)
MCH: 26.9 pg (ref 26.0–34.0)
MCHC: 31.9 g/dL (ref 30.0–36.0)
MCV: 84.5 fL (ref 78.0–100.0)
MONO ABS: 1.4 10*3/uL — AB (ref 0.1–1.0)
MONOS PCT: 13 %
NEUTROS ABS: 6.4 10*3/uL (ref 1.7–7.7)
Neutrophils Relative %: 63 %
Platelets: 232 10*3/uL (ref 150–400)
RBC: 4.53 MIL/uL (ref 4.22–5.81)
RDW: 15.6 % — AB (ref 11.5–15.5)
WBC: 10.2 10*3/uL (ref 4.0–10.5)

## 2015-10-25 LAB — CBC
HCT: 37.4 % — ABNORMAL LOW (ref 39.0–52.0)
Hemoglobin: 11.8 g/dL — ABNORMAL LOW (ref 13.0–17.0)
MCH: 26.7 pg (ref 26.0–34.0)
MCHC: 31.6 g/dL (ref 30.0–36.0)
MCV: 84.6 fL (ref 78.0–100.0)
PLATELETS: 219 10*3/uL (ref 150–400)
RBC: 4.42 MIL/uL (ref 4.22–5.81)
RDW: 15.7 % — AB (ref 11.5–15.5)
WBC: 9.1 10*3/uL (ref 4.0–10.5)

## 2015-10-25 LAB — LACTATE DEHYDROGENASE: LDH: 324 U/L — AB (ref 98–192)

## 2015-10-25 LAB — PROTIME-INR
INR: 1.15 (ref 0.00–1.49)
PROTHROMBIN TIME: 14.8 s (ref 11.6–15.2)

## 2015-10-25 LAB — URIC ACID: Uric Acid, Serum: 7.1 mg/dL (ref 4.4–7.6)

## 2015-10-25 MED ORDER — FENTANYL CITRATE (PF) 100 MCG/2ML IJ SOLN
INTRAMUSCULAR | Status: AC
  Filled 2015-10-25: qty 2

## 2015-10-25 MED ORDER — FENTANYL CITRATE (PF) 100 MCG/2ML IJ SOLN
INTRAMUSCULAR | Status: AC | PRN
  Administered 2015-10-25: 50 ug via INTRAVENOUS

## 2015-10-25 MED ORDER — ACETAMINOPHEN 325 MG PO TABS: 650.0000 mg | ORAL_TABLET | Freq: Four times a day (QID) | ORAL | Status: DC | PRN

## 2015-10-25 MED ORDER — HEPARIN SODIUM (PORCINE) 5000 UNIT/ML IJ SOLN: 5000.0000 [IU] | Freq: Three times a day (TID) | INTRAMUSCULAR | Status: DC

## 2015-10-25 MED ORDER — CEFUROXIME AXETIL 500 MG PO TABS: 500.0000 mg | ORAL_TABLET | Freq: Two times a day (BID) | ORAL | Status: DC

## 2015-10-25 MED ORDER — LIDOCAINE HCL (PF) 1 % IJ SOLN
INTRAMUSCULAR | Status: AC
  Filled 2015-10-25: qty 10

## 2015-10-25 NOTE — Discharge Summary (Addendum)
Physician Discharge Summary  Kristopher Johnson [id number] DOB: 1956/03/21 DOA: 10/24/2015  PCP: No PCP Per Patient  Admit date: 10/24/2015 Discharge date: 10/25/2015  Time spent: Less than 30 minutes  Recommendations for Outpatient Follow-up:  1. Dr. Sullivan Lone, Oncology: MDs office will arrange outpatient follow-up.  Discharge Diagnoses:  Principal Problem:   Lymphadenopathy Active Problems:   Hypoalbuminemia   Elevated lipase   Inguinal hernia   Urinary retention   Discharge Condition: Improved & Stable  Diet recommendation: Regular diet  Filed Weights   10/24/15 1413  Weight: 83.915 kg (185 lb)    History of present illness & Hospital course:  60 year old male patient with no known significant past medical history, has not seen a doctor in years, presented to [facility] ED on 10/24/15 with complaints of abdominal pain, difficulty urinating and rash. He stated that his symptoms started sometime around Thanksgiving of last year. He noted early satiety and upper abdominal discomfort. About the same time, he noted a rash that showed up along his right arm and back. He complained of swelling on right side of scrotum/inguinal region and difficulty with urination. The right inguinal swelling was intermittently painful. Significant weight loss ~25 pounds. No reported substance abuse. Evaluation in ED revealed glucose 178, albumin 2.9, hemoglobin 12.2, CT soft tissue of neck, chest, abdomen and pelvis with contrast showed generalized lymphadenopathy suspicious for lymphoma. Oncology was consulted and recommended inpatient observation for need for biopsy. Interventional radiology was consulted this morning for guided biopsy. When patient was seen this morning, he was threatening to leave AMA. Discussed at length with him and advised him that we were doing the best we can to complete his in-hospital workup so that he may be discharged home. Oncology saw him post right inguinal lymph node biopsy and  have cleared him for discharge and will arrange close outpatient follow-up with pathology results and PET/CT scan. Case management/social worker were consulted regarding outpatient assistance i.e. 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 Patient declines home health services. Physical exam reveals bilateral submandibular and inguinal lymphadenopathy. Patient has currently uncomplicated bilateral inguinal hernia, right >left. Patient has been advised to seek immediate medical attention if he has abdominal pain/groin pain suggesting complicating inguinal hernia. Otherwise he should seek surgical consultation for a elective herniorrhaphy when able. Patient states that he is able to void freely today. Bladder scan after voiding did not reveal urinary retention. He apparently declined placement of Foley catheter in the ED and do not see a need at this time. His urinary symptoms may be related to lymphadenopathy and/or inguinal hernia's. Patient has a crusted papular rash of his right arm and back of unclear etiology-? Related to lymphoma. Outpatient follow-up. Elevated lipase of unclear significance. Patient will need outpatient PCP to follow up regarding general medical needs, surgical referral etc. This can be arranged through the cancer center. CBGs in the hospital are well controlled. A1c is pending and can be followed as outpatient. Prealbumin is low at 11.9. Uric acid normal at 7.1. LDH elevated at 324. Mild anemia-outpatient follow-up with oncology.  Consultations:  Medical oncology  Interventional Radiology  Procedures:  Ultrasound guided core biopsy right superficial inguinal lymph nodes 1/17.    Discharge Exam:  Complaints:  Patient denied complaints. Anxious to be discharged or threatening to leave AMA. Denied pain or difficulty urinating at this  time.  Filed Vitals:   10/25/15 1324  10/25/15 1357 10/25/15 1430 10/25/15 1536  BP: 121/72 131/72 128/70 127/62  Pulse: 100 96 92 91  Temp: 98.2 F (36.8 C)     TempSrc:      Resp: 18 16  20   Height:      Weight:      SpO2: 93%   95%    General exam: Pleasant middle-aged male lying comfortably supine in bed. Bilateral submandibular and inguinal lymphadenopathy. Respiratory system: Clear. No increased work of breathing. Cardiovascular system: S1 & S2 heard, RRR. No JVD, murmurs, gallops, clicks or pedal edema. Gastrointestinal system: Abdomen is mildly distended, soft and nontender. Normal bowel sounds heard. Uncomplicated, moderate sized right inguinal hernia and small left inguinal hernia. Central nervous system: Alert and oriented. No focal neurological deficits. Extremities: Symmetric 5 x 5 power.  Discharge Instructions      Discharge Instructions    Activity as tolerated - No restrictions    Complete by:  As directed      Call MD for:  difficulty breathing, headache or visual disturbances    Complete by:  As directed      Call MD for:  extreme fatigue    Complete by:  As directed      Call MD for:  persistant dizziness or light-headedness    Complete by:  As directed      Call MD for:  persistant nausea and vomiting    Complete by:  As directed      Call MD for:  redness, tenderness, or signs of infection (pain, swelling, redness, odor or green/yellow discharge around incision site)    Complete by:  As directed      Call MD for:  severe uncontrolled pain    Complete by:  As directed      Call MD for:  temperature >100.4    Complete by:  As directed      Diet general    Complete by:  As directed             Medication List    STOP taking these medications        aspirin EC 325 MG tablet      TAKE these medications        acetaminophen 325 MG tablet  Commonly known as:  TYLENOL  Take 2 tablets (650 mg total) by mouth every 6 (six) hours as needed for  mild pain, moderate pain, fever or headache (or Fever >/= 101).       Follow-up Information    Follow up with Sullivan Lone, MD.   Specialties:  Hematology, Oncology   Why:  MDs office will call with appointment to follow up.   Contact information:   [place] [address] [id number]        The results of significant diagnostics from this hospitalization (including imaging, microbiology, ancillary and laboratory) are listed below for reference.    Significant Diagnostic Studies: Ct Soft Tissue Neck W Contrast  10/24/2015  CLINICAL DATA:  Concerning for lymphoma. Abdominal pain, urinary retention. Imaging today demonstrates possible lymphoma. EXAM: CT NECK AND CHEST WITHOUT CONTRAST COMPARISON:  None. FINDINGS: CT NECK FINDINGS (limited assessment without contrast) Pharynx and larynx: Normal. Salivary glands: Normal. Thyroid: Probable lymphadenopathy versus thyromegaly, difficult to discern without contrast. Lymph nodes: Lymphadenopathy along the cervical changing, including 14 mm short access RIGHT level IIa lymph node, multiple presumed lymph nodes level 3 and 4. Probable LEFT supraclavicular lymphadenopathy though, difficult to differentiate from vascular structures. Vascular:  Limited assessment. Limited intracranial: Normal. Visualized orbits: Abnormal soft tissue within the retrobulbar fat at the optic nerve insertion, LEFT greater than RIGHT. The ocular globes are intact, status post bilateral ocular lens implants. Extraocular muscles are unremarkable. Preservation of orbital fat. Mastoids and visualized paranasal sinuses: Well aerated. Skeleton: No destructive bony lesions. Mild to moderate degenerative change of the cervical spine. CT CHEST FINDINGS Mediastinum/Lymph Nodes: Mediastinal lymphadenopathy, including LEFT peritracheal at least 22 mm nodal conglomeration. Bilateral presumed hilar lymphadenopathy. Internal mammary chain lymphadenopathy on the RIGHT measuring up to 14  mm. Bulky bilateral axillary lymphadenopathy measuring up to 28 mm short access. 19 mm short access cardio phrenic angle lymph node. Heart size normal. No pericardial effusions. Lungs/Pleura: Innumerable, diffuse subcentimeter centrilobular nodules and tree-in-bud infiltrates. Small bilateral pleural effusions with compressive atelectasis. Tracheobronchial tree is patent and midline. No pneumothorax. Musculoskeletal: No chest wall mass or suspicious bone lesions identified. IMPRESSION: CT NECK: Limited non contrast assessment. Extensive lymphadenopathy compatible with lymphoma, difficult to differentiate from underlying structures due to lack of contrast. Patent airway. Bilateral orbital retrobulbar masses most consistent with lymphoma. This would be better characterized on MRI of the orbits with contrast on a nonemergent basis. CT CHEST: Limited noncontrast assessment. Extensive lymphadenopathy consistent with lymphoma. Diffuse centrilobular nodules and tree-in-bud infiltrates most consistent with lymphangitic spread of lymphoma though, and infection could have a similar appearance. Small bilateral pleural effusions are likely malignant. Electronically Signed   By: Elon Alas M.D.   On: 10/24/2015 22:17   Ct Chest W Contrast  10/24/2015  CLINICAL DATA:  Concerning for lymphoma. Abdominal pain, urinary retention. Imaging today demonstrates possible lymphoma. EXAM: CT NECK AND CHEST WITHOUT CONTRAST COMPARISON:  None. FINDINGS: CT NECK FINDINGS (limited assessment without contrast) Pharynx and larynx: Normal. Salivary glands: Normal. Thyroid: Probable lymphadenopathy versus thyromegaly, difficult to discern without contrast. Lymph nodes: Lymphadenopathy along the cervical changing, including 14 mm short access RIGHT level IIa lymph node, multiple presumed lymph nodes level 3 and 4. Probable LEFT supraclavicular lymphadenopathy though, difficult to differentiate from vascular structures. Vascular: Limited  assessment. Limited intracranial: Normal. Visualized orbits: Abnormal soft tissue within the retrobulbar fat at the optic nerve insertion, LEFT greater than RIGHT. The ocular globes are intact, status post bilateral ocular lens implants. Extraocular muscles are unremarkable. Preservation of orbital fat. Mastoids and visualized paranasal sinuses: Well aerated. Skeleton: No destructive bony lesions. Mild to moderate degenerative change of the cervical spine. CT CHEST FINDINGS Mediastinum/Lymph Nodes: Mediastinal lymphadenopathy, including LEFT peritracheal at least 22 mm nodal conglomeration. Bilateral presumed hilar lymphadenopathy. Internal mammary chain lymphadenopathy on the RIGHT measuring up to 14 mm. Bulky bilateral axillary lymphadenopathy measuring up to 28 mm short access. 19 mm short access cardio phrenic angle lymph node. Heart size normal. No pericardial effusions. Lungs/Pleura: Innumerable, diffuse subcentimeter centrilobular nodules and tree-in-bud infiltrates. Small bilateral pleural effusions with compressive atelectasis. Tracheobronchial tree is patent and midline. No pneumothorax. Musculoskeletal: No chest wall mass or suspicious bone lesions identified. IMPRESSION: CT NECK: Limited non contrast assessment. Extensive lymphadenopathy compatible with lymphoma, difficult to differentiate from underlying structures due to lack of contrast. Patent airway. Bilateral orbital retrobulbar masses most consistent with lymphoma. This would be better characterized on MRI of the orbits with contrast on a nonemergent basis. CT CHEST: Limited noncontrast assessment. Extensive lymphadenopathy consistent with lymphoma. Diffuse centrilobular nodules and tree-in-bud infiltrates most consistent with lymphangitic spread of lymphoma though, and infection could have a similar appearance. Small bilateral pleural effusions are likely malignant. Electronically Signed  By: Elon Alas M.D.   On: 10/24/2015 22:17   Ct  Abdomen Pelvis W Contrast  10/24/2015  CLINICAL DATA:  Upper abdominal pain and bloating for 2 months. EXAM: CT ABDOMEN AND PELVIS WITH CONTRAST TECHNIQUE: Multidetector CT imaging of the abdomen and pelvis was performed using the standard protocol following bolus administration of intravenous contrast. CONTRAST:  120mL OMNIPAQUE IOHEXOL 300 MG/ML  SOLN COMPARISON:  None. FINDINGS: Lower chest: Nodularity along the bronchovascular bundles in both lower lobes could be lymphoma in his involvement of the lungs. There are bilateral pleural effusions and overlying atelectasis. There is also a 17 mm epicardial lymph node on image 9. No pericardial effusion. Small pleural nodules are also noted. Retrocrural adenopathy is also noted. Hepatobiliary: Simple appearing hepatic cyst in the left lobe. No worrisome hepatic lesions or intrahepatic biliary dilatation. The gallbladder is unremarkable. No common bile duct dilatation. Pancreas: No mass, inflammation or ductal dilatation. Spleen: Normal size.  No focal lesions. Adrenals/Urinary Tract: The adrenal glands are grossly normal. There is a thick rim soft tissue thickening along the posterior aspect of the left kidney which could be lymphomatous involvement. A left renal calculus is also noted. There is also a a thick rim of soft tissue density along the left renal pelvis extending down along the inferior margin of the kidney which is likely lymphoma. This measures a maximum of 3.5 cm on image number 41. Stomach/Bowel: The stomach is markedly thickened and somewhat nodular and irregular. Findings suspicious for gastric lymphoma. The duodenum, small bowel and colon are grossly normal. No obvious inflammatory changes or mass lesions. No obstructive findings. The terminal ileum is normal. The appendix is normal. Vascular/Lymphatic: Bulky mesenteric and retroperitoneal lymphadenopathy. Large upper abdominal mesenteric mass measures 12 x 7.5 cm on image number 22. There is also  bulky retroperitoneal adenopathy with an index node between the aorta and left kidney measuring 30 x 24 mm on image number 32. There is also bilateral pelvic and inguinal lymphadenopathy. An index right external iliac lymph node on image number 69 measures a maximum of 21 mm short axis. Right inguinal lymph node measures 27 x 16 mm on image number 87. The aorta is normal in caliber. The branch vessels are patent. The major venous structures are patent. Mild compression of the IVC by the bulky adenopathy. Other: The bladder is normal. The prostate gland is mildly enlarged with median lobe hypertrophy impressing on the base of the bladder. The seminal vesicles are normal. No free pelvic fluid collections. Bilateral inguinal hernias containing small bowel loops. Musculoskeletal: No obvious changes of osseous lymphoma. IMPRESSION: 1. Constellation of findings most consistent with lymphoma. 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 2. Suspect lymphomatous involvement of the lungs. There also bilateral pleural effusions and overlying atelectasis. No obvious involvement of the liver or spleen. Bilateral inguinal hernias containing small bowel loops. Electronically Signed   By: Marijo Sanes M.D.   On: 10/24/2015 19:15   US Biopsy  10/25/2015  CLINICAL DATA:  60 year old male with new diagnosis of profound adenopathy concerning for advanced lymphoma. He presents for ultrasound-guided core biopsy to obtain tissue diagnosis. EXAM: ULTRASOUND BIOPSY LYMPH NODE Date: 10/25/2015 PROCEDURE: 1. Ultrasound-guided core biopsy of right superficial inguinal lymph nodes Interventional Radiologist:  Criselda Peaches, MD ANESTHESIA/SEDATION: 50 mcg fentanyl administered intravenously. MEDICATIONS: None additional TECHNIQUE: Informed consent was obtained from the patient following  explanation of the procedure,  risks, benefits and alternatives. The patient understands, agrees and consents for the procedure. All questions were addressed. A time out was performed. The right groin was interrogated with ultrasound. Multiple enlarged superficial inguinal lymph nodes are identified. The nodes are superficial to the common femoral artery and vein. An appropriate skin entry site was selected and marked. The region was sterilely prepped and draped in standard fashion using chlorhexidine skin prep. Under real-time sonographic guidance, multiple 16 gauge core biopsies were then coaxially obtained. Needle placement was confirmed on all biopsy passes with real-time sonography. Biopsy specimens were placed in saline and delivered to pathology for further analysis. Post biopsy ultrasound imaging demonstrates no active bleeding or hematoma. The patient tolerated the procedure well. COMPLICATIONS: None. Estimated blood loss: 0 IMPRESSION: Technically successful ultrasound-guided core biopsy of superficial right inguinal adenopathy. Signed, Criselda Peaches, MD [facility] Electronically Signed   By: Jacqulynn Cadet M.D.   On: 10/25/2015 14:25    Microbiology: No results found for this or any previous visit (from the past 240 hour(s)).   Labs: Basic Metabolic Panel:  Recent Labs Lab 10/24/15 1426  NA 137  K 4.2  CL 101  CO2 25  GLUCOSE 178*  BUN 11  CREATININE 1.10  CALCIUM 9.0   Liver Function Tests:  Recent Labs Lab 10/24/15 1426  AST 24  ALT 16*  ALKPHOS 84  BILITOT 0.4  PROT 6.2*  ALBUMIN 2.9*    Recent Labs Lab 10/24/15 1426  LIPASE 88*   No results for input(s): AMMONIA in the last 168 hours. CBC:  Recent Labs Lab 10/24/15 1426 10/25/15 0022 10/25/15 0609  WBC 9.1 10.2 9.1  NEUTROABS  --  6.4  --   HGB 13.1 12.2* 11.8*  HCT 40.9 38.3* 37.4*  MCV 84.9 84.5 84.6  PLT 222 232 219   Cardiac  Enzymes: No results for input(s): CKTOTAL, CKMB, CKMBINDEX, TROPONINI in the last 168 hours. BNP: BNP (last 3 results) No results for input(s): BNP in the last 8760 hours.  ProBNP (last 3 results) No results for input(s): PROBNP in the last 8760 hours.  CBG:  Recent Labs Lab 10/25/15 0039 10/25/15 0454 10/25/15 0857 10/25/15 1255  GLUCAP 79 81 79 70       Signed:  Vernell Leep, MD, FACP, FHM. [facility] Pager [phone]  If 7PM-7AM, please contact night-coverage [email or web address] Password [facility] 10/25/2015, 3:53 PM

## 2015-10-25 NOTE — Progress Notes (Signed)
Noted pt to be back in his room at this time, unaware he had returned to room. Pt sitting up on side of bed, pt refused to lay down in bed even after explaining orders for bedrest for 1 hour after biopsy to decrease risk of bleeding from biopsy site. Pt still refused, "I'm all right, I not gonna bleed". Site assessed, noted blood on bandaid only, none around site, area soft to touch. Again encouraged pt to lay down for the time ordered, again declined "I'll be all right".  VSS

## 2015-10-25 NOTE — Procedures (Signed)
Interventional Radiology Procedure Note  Procedure: US guided core biopsy right superficial inguinal LNs.  16G cores obtained.   Complications: None   Estimated Blood Loss: 0  Recommendations: - Bedrest x 1 hr - Path pending  Signed,  Criselda Peaches, MD

## 2015-10-25 NOTE — Progress Notes (Signed)
Patient bladder scanned per MD's order. IVF NS@125ml /hr. Patient has voided a total of 274ml of urine since arrival onto unit at 2300. Bladder scan done showed only ~22ml in bladder. Patient denies feeling full or uncomfortable. Will continue to monitor urine output. If bladder scan >323ml will insert foley catheter per MD's order.

## 2015-10-25 NOTE — Discharge Instructions (Signed)

## 2015-10-25 NOTE — Consult Note (Signed)
Chief Complaint: Patient was seen in consultation today for Right inguinal vs Right axillary lymph node biopsy Chief Complaint  Patient presents with  . Abdominal Pain  . Urinary Retention  . Rash   at the request of Dr Algis Liming  Referring Physician(s): [facility]  History of Present Illness: Kristopher Johnson is a 60 y.o. male   Pt developed abdominal pain and early satiety x 2 months Worsening New onset inability to urinate; difficult to start stream and feels cannot empty fully Noted wt loss Work up reveals: IMPRESSION: CT Abd/Pelvis 1. Constellation of findings most consistent with lymphoma. 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 2. Suspect lymphomatous involvement of the lungs. There also bilateral pleural effusions and overlying atelectasis. No obvious involvement of the liver or spleen. Bilateral inguinal hernias containing small bowel loops.  CT CHEST: Limited noncontrast assessment. Extensive lymphadenopathy consistent with lymphoma.  Diffuse centrilobular nodules and tree-in-bud infiltrates most consistent with lymphangitic spread of lymphoma though, and infection could have a similar appearance. Small bilateral pleural effusions are likely malignant.  Note reflects ED MD consulted with Oncology and requested biopsy while inpatient Dr Laurence Ferrari has seen and reviewed imaging and approves procedure Plan for Right inguinal vs Right axillary lymph node biopsy I have seen and examine pt  History reviewed. No pertinent past medical history.  Past Surgical History  Procedure Laterality Date  . Tonsillectomy      Allergies: Review of patient's allergies indicates no known allergies.  Medications: Prior to Admission medications   Medication Sig Start Date End Date Taking? Authorizing Provider  aspirin EC  325 MG tablet Take 325 mg by mouth every 6 (six) hours as needed for mild pain.   Yes Historical Provider, MD     History reviewed. No pertinent family history.  Social History   Social History  . Marital Status: Widowed    Spouse Name: N/A  . Number of Children: N/A  . Years of Education: N/A   Social History Main Topics  . Smoking status: Current Every Day Smoker  . Smokeless tobacco: None  . Alcohol Use: No  . Drug Use: None  . Sexual Activity: Not Asked   Other Topics Concern  . None   Social History Narrative  . None     Review of Systems: A 12 point ROS discussed and pertinent positives are indicated in the HPI above.  All other systems are negative.  Review of Systems  Constitutional: Positive for appetite change, fatigue and unexpected weight change. Negative for fever and activity change.  Eyes: Positive for visual disturbance.  Respiratory: Negative for cough, chest tightness and shortness of breath.   Gastrointestinal: Positive for abdominal pain, constipation and abdominal distention.  Genitourinary: Positive for dysuria, frequency and difficulty urinating.  Musculoskeletal: Negative for back pain.  Neurological: Negative for weakness.  Psychiatric/Behavioral: Negative for behavioral problems and confusion.    Vital Signs: BP 106/56 mmHg  Pulse 96  Temp(Src) 98 F (36.7 C) (Oral)  Resp 18  Ht 5\' 9"  (1.753 m)  Wt 185 lb (83.915 kg)  BMI 27.31 kg/m2  SpO2 94%  Physical Exam  Constitutional: He is oriented to person, place, and time. He appears well-nourished.  Cardiovascular: Normal rate, regular rhythm and normal heart sounds.   Pulmonary/Chest: Effort normal and breath sounds normal. He has no wheezes.  Abdominal: Soft. Bowel sounds are normal. He exhibits distension.  Musculoskeletal: Normal  range of motion.  Neurological: He is alert and oriented to person, place, and time.  Skin: Skin is warm and dry.  Psychiatric: He has a normal mood and  affect. His behavior is normal. Judgment and thought content normal.  Nursing note and vitals reviewed.   Mallampati Score:  MD Evaluation Airway: WNL Heart: WNL Abdomen: WNL Chest/ Lungs: WNL ASA  Classification: 3 Mallampati/Airway Score: One  Imaging: Ct Soft Tissue Neck W Contrast  10/24/2015  CLINICAL DATA:  Concerning for lymphoma. Abdominal pain, urinary retention. Imaging today demonstrates possible lymphoma. EXAM: CT NECK AND CHEST WITHOUT CONTRAST COMPARISON:  None. FINDINGS: CT NECK FINDINGS (limited assessment without contrast) Pharynx and larynx: Normal. Salivary glands: Normal. Thyroid: Probable lymphadenopathy versus thyromegaly, difficult to discern without contrast. Lymph nodes: Lymphadenopathy along the cervical changing, including 14 mm short access RIGHT level IIa lymph node, multiple presumed lymph nodes level 3 and 4. Probable LEFT supraclavicular lymphadenopathy though, difficult to differentiate from vascular structures. Vascular: Limited assessment. Limited intracranial: Normal. Visualized orbits: Abnormal soft tissue within the retrobulbar fat at the optic nerve insertion, LEFT greater than RIGHT. The ocular globes are intact, status post bilateral ocular lens implants. Extraocular muscles are unremarkable. Preservation of orbital fat. Mastoids and visualized paranasal sinuses: Well aerated. Skeleton: No destructive bony lesions. Mild to moderate degenerative change of the cervical spine. CT CHEST FINDINGS Mediastinum/Lymph Nodes: Mediastinal lymphadenopathy, including LEFT peritracheal at least 22 mm nodal conglomeration. Bilateral presumed hilar lymphadenopathy. Internal mammary chain lymphadenopathy on the RIGHT measuring up to 14 mm. Bulky bilateral axillary lymphadenopathy measuring up to 28 mm short access. 19 mm short access cardio phrenic angle lymph node. Heart size normal. No pericardial effusions. Lungs/Pleura: Innumerable, diffuse subcentimeter centrilobular  nodules and tree-in-bud infiltrates. Small bilateral pleural effusions with compressive atelectasis. Tracheobronchial tree is patent and midline. No pneumothorax. Musculoskeletal: No chest wall mass or suspicious bone lesions identified. IMPRESSION: CT NECK: Limited non contrast assessment. Extensive lymphadenopathy compatible with lymphoma, difficult to differentiate from underlying structures due to lack of contrast. Patent airway. Bilateral orbital retrobulbar masses most consistent with lymphoma. This would be better characterized on MRI of the orbits with contrast on a nonemergent basis. CT CHEST: Limited noncontrast assessment. Extensive lymphadenopathy consistent with lymphoma. Diffuse centrilobular nodules and tree-in-bud infiltrates most consistent with lymphangitic spread of lymphoma though, and infection could have a similar appearance. Small bilateral pleural effusions are likely malignant. Electronically Signed   By: Elon Alas M.D.   On: 10/24/2015 22:17   Ct Chest W Contrast  10/24/2015  CLINICAL DATA:  Concerning for lymphoma. Abdominal pain, urinary retention. Imaging today demonstrates possible lymphoma. EXAM: CT NECK AND CHEST WITHOUT CONTRAST COMPARISON:  None. FINDINGS: CT NECK FINDINGS (limited assessment without contrast) Pharynx and larynx: Normal. Salivary glands: Normal. Thyroid: Probable lymphadenopathy versus thyromegaly, difficult to discern without contrast. Lymph nodes: Lymphadenopathy along the cervical changing, including 14 mm short access RIGHT level IIa lymph node, multiple presumed lymph nodes level 3 and 4. Probable LEFT supraclavicular lymphadenopathy though, difficult to differentiate from vascular structures. Vascular: Limited assessment. Limited intracranial: Normal. Visualized orbits: Abnormal soft tissue within the retrobulbar fat at the optic nerve insertion, LEFT greater than RIGHT. The ocular globes are intact, status post bilateral ocular lens implants.  Extraocular muscles are unremarkable. Preservation of orbital fat. Mastoids and visualized paranasal sinuses: Well aerated. Skeleton: No destructive bony lesions. Mild to moderate degenerative change of the cervical spine. CT CHEST FINDINGS Mediastinum/Lymph Nodes: Mediastinal lymphadenopathy, including LEFT peritracheal at least 22 mm nodal  conglomeration. Bilateral presumed hilar lymphadenopathy. Internal mammary chain lymphadenopathy on the RIGHT measuring up to 14 mm. Bulky bilateral axillary lymphadenopathy measuring up to 28 mm short access. 19 mm short access cardio phrenic angle lymph node. Heart size normal. No pericardial effusions. Lungs/Pleura: Innumerable, diffuse subcentimeter centrilobular nodules and tree-in-bud infiltrates. Small bilateral pleural effusions with compressive atelectasis. Tracheobronchial tree is patent and midline. No pneumothorax. Musculoskeletal: No chest wall mass or suspicious bone lesions identified. IMPRESSION: CT NECK: Limited non contrast assessment. Extensive lymphadenopathy compatible with lymphoma, difficult to differentiate from underlying structures due to lack of contrast. Patent airway. Bilateral orbital retrobulbar masses most consistent with lymphoma. This would be better characterized on MRI of the orbits with contrast on a nonemergent basis. CT CHEST: Limited noncontrast assessment. Extensive lymphadenopathy consistent with lymphoma. Diffuse centrilobular nodules and tree-in-bud infiltrates most consistent with lymphangitic spread of lymphoma though, and infection could have a similar appearance. Small bilateral pleural effusions are likely malignant. Electronically Signed   By: Elon Alas M.D.   On: 10/24/2015 22:17   Ct Abdomen Pelvis W Contrast  10/24/2015  CLINICAL DATA:  Upper abdominal pain and bloating for 2 months. EXAM: CT ABDOMEN AND PELVIS WITH CONTRAST TECHNIQUE: Multidetector CT imaging of the abdomen and pelvis was performed using the  standard protocol following bolus administration of intravenous contrast. CONTRAST:  164mL OMNIPAQUE IOHEXOL 300 MG/ML  SOLN COMPARISON:  None. FINDINGS: Lower chest: Nodularity along the bronchovascular bundles in both lower lobes could be lymphoma in his involvement of the lungs. There are bilateral pleural effusions and overlying atelectasis. There is also a 17 mm epicardial lymph node on image 9. No pericardial effusion. Small pleural nodules are also noted. Retrocrural adenopathy is also noted. Hepatobiliary: Simple appearing hepatic cyst in the left lobe. No worrisome hepatic lesions or intrahepatic biliary dilatation. The gallbladder is unremarkable. No common bile duct dilatation. Pancreas: No mass, inflammation or ductal dilatation. Spleen: Normal size.  No focal lesions. Adrenals/Urinary Tract: The adrenal glands are grossly normal. There is a thick rim soft tissue thickening along the posterior aspect of the left kidney which could be lymphomatous involvement. A left renal calculus is also noted. There is also a a thick rim of soft tissue density along the left renal pelvis extending down along the inferior margin of the kidney which is likely lymphoma. This measures a maximum of 3.5 cm on image number 41. Stomach/Bowel: The stomach is markedly thickened and somewhat nodular and irregular. Findings suspicious for gastric lymphoma. The duodenum, small bowel and colon are grossly normal. No obvious inflammatory changes or mass lesions. No obstructive findings. The terminal ileum is normal. The appendix is normal. Vascular/Lymphatic: Bulky mesenteric and retroperitoneal lymphadenopathy. Large upper abdominal mesenteric mass measures 12 x 7.5 cm on image number 22. There is also bulky retroperitoneal adenopathy with an index node between the aorta and left kidney measuring 30 x 24 mm on image number 32. There is also bilateral pelvic and inguinal lymphadenopathy. An index right external iliac lymph node on  image number 69 measures a maximum of 21 mm short axis. Right inguinal lymph node measures 27 x 16 mm on image number 87. The aorta is normal in caliber. The branch vessels are patent. The major venous structures are patent. Mild compression of the IVC by the bulky adenopathy. Other: The bladder is normal. The prostate gland is mildly enlarged with median lobe hypertrophy impressing on the base of the bladder. The seminal vesicles are normal. No free pelvic fluid collections. Bilateral  inguinal hernias containing small bowel loops. Musculoskeletal: No obvious changes of osseous lymphoma. IMPRESSION: 1. Constellation of findings most consistent with lymphoma. 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 2. Suspect lymphomatous involvement of the lungs. There also bilateral pleural effusions and overlying atelectasis. No obvious involvement of the liver or spleen. Bilateral inguinal hernias containing small bowel loops. Electronically Signed   By: Marijo Sanes M.D.   On: 10/24/2015 19:15    Labs:  CBC:  Recent Labs  10/24/15 1426 10/25/15 0022 10/25/15 0609  WBC 9.1 10.2 9.1  HGB 13.1 12.2* 11.8*  HCT 40.9 38.3* 37.4*  PLT 222 232 219    COAGS:  Recent Labs  10/25/15 0609  INR 1.15  APTT 33    BMP:  Recent Labs  10/24/15 1426  NA 137  K 4.2  CL 101  CO2 25  GLUCOSE 178*  BUN 11  CALCIUM 9.0  CREATININE 1.10  GFRNONAA >60  GFRAA >60    LIVER FUNCTION TESTS:  Recent Labs  10/24/15 1426  BILITOT 0.4  AST 24  ALT 16*  ALKPHOS 84  PROT 6.2*  ALBUMIN 2.9*    TUMOR MARKERS: No results for input(s): AFPTM, CEA, CA199, CHROMGRNA in the last 8760 hours.  Assessment and Plan:  Abnormal CT Chest /abdomen/pelvis Diffuse Lymphadenopathy Request for biopsy Scheduled for Rt axillary vs Rt inguinal LAN bx Risks and  Benefits discussed with the patient including, but not limited to bleeding, infection, damage to adjacent structures or low yield requiring additional tests. All of the patient's questions were answered, patient is agreeable to proceed. Consent signed and in chart.   Thank you for this interesting consult.  I greatly enjoyed meeting LAMARIO MU and look forward to participating in their care.  A copy of this report was sent to the requesting provider on this date.  Electronically Signed: Lannie Yusuf A 10/25/2015, 11:03 AM   I spent a total of 40 Minutes    in face to face in clinical consultation, greater than 50% of which was counseling/coordinating care for LAN bx

## 2015-10-25 NOTE — Care Management Note (Signed)
Case Management Note  Patient Details  Name: TYMELL GLADSTONE MRN: [id number] Date of Birth: March 23, 1956  Subjective/Objective:      Admitted with lymphadenopathy              Action/Plan: Spoke with patient about transportation to clinic appointments. Patient does not drive, states he walks everywhere or has a friend drive him, has not used [facility] for several years. Consulted with CSW, [employer] helps with transport to MD visits. Contacted them, got information about scheduling transport ation and gave phone #, website and scheduling info to patient. They required 4 business days to schedule. Patient stated that he would contact them for appontments. Informed patient that clinic also has a Education officer, museum who can assist with transportation once he is a patient there. Gave patient number for [facility]. Patient stated that he thinks he can arrange ride for Thursday appointment or he will call to reschedule and schedule ride with [employer].         Expected Discharge Date:                  Expected Discharge Plan:  Home/Self Care  In-House Referral:  Clinical Social Work  Discharge planning Services  CM Consult  Post Acute Care Choice:    Choice offered to:     DME Arranged:    DME Agency:     HH Arranged:    [place] Agency:     Status of Service:  Completed, signed off  Medicare Important Message Given:    Date Medicare IM Given:    Medicare IM give by:    Date Additional Medicare IM Given:    Additional Medicare Important Message give by:     If discussed at [place] of Stay Meetings, dates discussed:    Additional Comments:  Nila Nephew, RN 10/25/2015, 5:38 PM

## 2015-10-25 NOTE — Consult Note (Addendum)
[name] Kitchen    HEMATOLOGY/ONCOLOGY CONSULTATION NOTE  Date of Service: 10/25/2015  Patient Care Team: No Pcp Per Patient as PCP - General (General Practice)  Attending: [name], MD   CHIEF COMPLAINTS/PURPOSE OF CONSULTATION:  New generalized lymphadenopathy, weight loss, night sweats -concern for lymphoma  HISTORY OF PRESENTING ILLNESS:   Kristopher Johnson is a wonderful 60 y.o. male who has been referred to [place] by Dr Algis Liming MD MS for evaluation of possible lymphoma.  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 He was following up with retinal specialist and was last evaluated for this in 2005 as per his report. Patient notes no other chronic medical issues. Has not been in the care of her primary care physician. He notes that his poor vision significantly limits his transportation options. He has still been living alone and has been able to maintain his independence.  Patient notes that since Thanksgiving he has developed progressive fatigue, drenching night sweats and weight loss of about 15-20 pounds. He has also noted some increasing shortness of breath, abdominal distention, early satiety, decreased appetite and difficulty passing urine. He notes that he has had a right-sided inguinal hernia for several years but has been increasing in size over the last 6 months to year.  He also notes that he has developed significant new  generalized pruritus and skin ulcers on his upper extremities and back. Notes some enlargement of lymph glands in his neck. No significant difficulty swallowing.  Patient decided to come to the emergency room after he had significant difficulty with passing urine and had to force it out. In the emergency room the patient had a CT of the abdomen that showed large upper abdominal mass, mesenteric lymphadenopathy, retroperitoneal adenopathy. His LDH level was  elevated to 324. He had a CT of the chest and neck which also showed significant lymphadenopathy. He was admitted for an expedited workup given his poor medical set up and visual issues as well as the urgency with diagnosis given extensive disease. Patient has had several core needle biopsies of his enlarged right inguinal lymph node today. He is keen to be discharged home.  We discussed the findings of his imaging studies and the potential differential diagnosis and concerns. He notes that he is agreeable following up with  me at the [facility].  MEDICAL HISTORY:  Past Medical History  Diagnosis Date  . Retinitis pigmentosa     Patient notes that he has been declared legally blind since 1983 and is on Social Security disability    SURGICAL HISTORY: Past Surgical History  Procedure Laterality Date  . Tonsillectomy    . Cataract surgery      Patient notes she has had bilateral Surgery in 1998 and 99    SOCIAL HISTORY: Social History   Social History  . Marital Status: Widowed    Spouse Name: N/A  . Number of Children: N/A  . Years of Education: N/A   Occupational History  . Not on file.   Social History Main Topics  . Smoking status: Current Every Day Smoker  . Smokeless tobacco: Not on file  . Alcohol Use: No  . Drug Use: Not on file  . Sexual Activity: Not on file   Other Topics Concern  . Not on file   Social History Narrative   Patient notes that he did experiment with drugs the 1980s and  1990s but nothing since then.   FAMILY HISTORY: History reviewed. No pertinent family history.  ALLERGIES:  has No Known Allergies.  MEDICATIONS:  Current Facility-Administered Medications  Medication Dose Route Frequency Provider Last Rate Last Dose  . 0.9 %  sodium chloride infusion   Intravenous Continuous [name], MD 125 mL/hr at 10/24/15 2330    . acetaminophen (TYLENOL) tablet 650 mg  650 mg Oral Q6H PRN [name], MD       Or  .  acetaminophen (TYLENOL) suppository 650 mg  650 mg Rectal Q6H PRN [name], MD      . fentaNYL (SUBLIMAZE) 100 MCG/2ML injection           . [START ON 10/26/2015] heparin injection 5,000 Units  5,000 Units Subcutaneous 3 times per day Monia Sabal, PA-C      . insulin aspart (novoLOG) injection 0-9 Units  0-9 Units Subcutaneous 6 times per day [name], MD   0 Units at 10/25/15 0000  . lidocaine (PF) (XYLOCAINE) 1 % injection           . morphine 2 MG/ML injection 1 mg  1 mg Intravenous Q2H PRN [name], MD      . ondansetron (ZOFRAN) tablet 4 mg  4 mg Oral Q6H PRN [name], MD       Or  . ondansetron (ZOFRAN) injection 4 mg  4 mg Intravenous Q6H PRN [name], MD        REVIEW OF SYSTEMS:    10 Point review of Systems was done is negative except as noted above.  PHYSICAL EXAMINATION: ECOG PERFORMANCE STATUS: 1 - Symptomatic but completely ambulatory  . Filed Vitals:   10/25/15 1324 10/25/15 1357  BP: 121/72 131/72  Pulse: 100 96  Temp: 98.2 F (36.8 C)   Resp: 18 16   Filed Weights   10/24/15 1413  Weight: 185 lb (83.915 kg)   .Body mass index is 27.31 kg/(m^2).  GENERAL:alert, in no acute distress and comfortable SKIN: skin color, texture, turgor are normal, no rashes or significant lesions EYES: normal, conjunctiva are pink and non-injected, sclera clear OROPHARYNX:no exudate, no erythema and lips, buccal mucosa, and tongue normal  NECK: supple, no JVD, thyroid normal size, non-tender, without nodularity LYMPH:  no palpable lymphadenopathy in the cervical, axillary or inguinal LUNGS: clear to auscultation with normal respiratory effort HEART: regular rate & rhythm,  no murmurs and no lower extremity edema ABDOMEN: abdomen soft, non-tender, normoactive bowel sounds  Musculoskeletal: no cyanosis of digits and no clubbing  PSYCH: alert & oriented x 3 with fluent speech NEURO: no focal motor/sensory deficits  LABORATORY DATA:  I have  reviewed the data as listed  . CBC Latest Ref Rng 10/25/2015 10/25/2015 10/24/2015  WBC 4.0 - 10.5 K/uL 9.1 10.2 9.1  Hemoglobin 13.0 - 17.0 g/dL 11.8(L) 12.2(L) 13.1  Hematocrit 39.0 - 52.0 % 37.4(L) 38.3(L) 40.9  Platelets 150 - 400 K/uL 219 232 222    . CMP Latest Ref Rng 10/24/2015  Glucose 65 - 99 mg/dL 178(H)  BUN 6 - 20 mg/dL 11  Creatinine 0.61 - 1.24 mg/dL 1.10  Sodium 135 - 145 mmol/L 137  Potassium 3.5 - 5.1 mmol/L 4.2  Chloride 101 - 111 mmol/L 101  CO2 22 - 32 mmol/L 25  Calcium 8.9 - 10.3 mg/dL 9.0  Total Protein 6.5 - 8.1 g/dL 6.2(L)  Total Bilirubin 0.3 - 1.2 mg/dL 0.4  Alkaline Phos 38 -  126 U/L 84  AST 15 - 41 U/L 24  ALT 17 - 63 U/L 16(L)     RADIOGRAPHIC STUDIES: I have personally reviewed the radiological images as listed and agreed with the findings in the report. Ct Soft Tissue Neck W Contrast  10/24/2015  CLINICAL DATA:  Concerning for lymphoma. Abdominal pain, urinary retention. Imaging today demonstrates possible lymphoma. EXAM: CT NECK AND CHEST WITHOUT CONTRAST COMPARISON:  None. FINDINGS: CT NECK FINDINGS (limited assessment without contrast) Pharynx and larynx: Normal. Salivary glands: Normal. Thyroid: Probable lymphadenopathy versus thyromegaly, difficult to discern without contrast. Lymph nodes: Lymphadenopathy along the cervical changing, including 14 mm short access RIGHT level IIa lymph node, multiple presumed lymph nodes level 3 and 4. Probable LEFT supraclavicular lymphadenopathy though, difficult to differentiate from vascular structures. Vascular: Limited assessment. Limited intracranial: Normal. Visualized orbits: Abnormal soft tissue within the retrobulbar fat at the optic nerve insertion, LEFT greater than RIGHT. The ocular globes are intact, status post bilateral ocular lens implants. Extraocular muscles are unremarkable. Preservation of orbital fat. Mastoids and visualized paranasal sinuses: Well aerated. Skeleton: No destructive bony lesions.  Mild to moderate degenerative change of the cervical spine. CT CHEST FINDINGS Mediastinum/Lymph Nodes: Mediastinal lymphadenopathy, including LEFT peritracheal at least 22 mm nodal conglomeration. Bilateral presumed hilar lymphadenopathy. Internal mammary chain lymphadenopathy on the RIGHT measuring up to 14 mm. Bulky bilateral axillary lymphadenopathy measuring up to 28 mm short access. 19 mm short access cardio phrenic angle lymph node. Heart size normal. No pericardial effusions. Lungs/Pleura: Innumerable, diffuse subcentimeter centrilobular nodules and tree-in-bud infiltrates. Small bilateral pleural effusions with compressive atelectasis. Tracheobronchial tree is patent and midline. No pneumothorax. Musculoskeletal: No chest wall mass or suspicious bone lesions identified. IMPRESSION: CT NECK: Limited non contrast assessment. Extensive lymphadenopathy compatible with lymphoma, difficult to differentiate from underlying structures due to lack of contrast. Patent airway. Bilateral orbital retrobulbar masses most consistent with lymphoma. This would be better characterized on MRI of the orbits with contrast on a nonemergent basis. CT CHEST: Limited noncontrast assessment. Extensive lymphadenopathy consistent with lymphoma. Diffuse centrilobular nodules and tree-in-bud infiltrates most consistent with lymphangitic spread of lymphoma though, and infection could have a similar appearance. Small bilateral pleural effusions are likely malignant. Electronically Signed   By: Elon Alas M.D.   On: 10/24/2015 22:17   Ct Chest W Contrast  10/24/2015  CLINICAL DATA:  Concerning for lymphoma. Abdominal pain, urinary retention. Imaging today demonstrates possible lymphoma. EXAM: CT NECK AND CHEST WITHOUT CONTRAST COMPARISON:  None. FINDINGS: CT NECK FINDINGS (limited assessment without contrast) Pharynx and larynx: Normal. Salivary glands: Normal. Thyroid: Probable lymphadenopathy versus thyromegaly, difficult to  discern without contrast. Lymph nodes: Lymphadenopathy along the cervical changing, including 14 mm short access RIGHT level IIa lymph node, multiple presumed lymph nodes level 3 and 4. Probable LEFT supraclavicular lymphadenopathy though, difficult to differentiate from vascular structures. Vascular: Limited assessment. Limited intracranial: Normal. Visualized orbits: Abnormal soft tissue within the retrobulbar fat at the optic nerve insertion, LEFT greater than RIGHT. The ocular globes are intact, status post bilateral ocular lens implants. Extraocular muscles are unremarkable. Preservation of orbital fat. Mastoids and visualized paranasal sinuses: Well aerated. Skeleton: No destructive bony lesions. Mild to moderate degenerative change of the cervical spine. CT CHEST FINDINGS Mediastinum/Lymph Nodes: Mediastinal lymphadenopathy, including LEFT peritracheal at least 22 mm nodal conglomeration. Bilateral presumed hilar lymphadenopathy. Internal mammary chain lymphadenopathy on the RIGHT measuring up to 14 mm. Bulky bilateral axillary lymphadenopathy measuring up to 28 mm short access. 19 mm short access  cardio phrenic angle lymph node. Heart size normal. No pericardial effusions. Lungs/Pleura: Innumerable, diffuse subcentimeter centrilobular nodules and tree-in-bud infiltrates. Small bilateral pleural effusions with compressive atelectasis. Tracheobronchial tree is patent and midline. No pneumothorax. Musculoskeletal: No chest wall mass or suspicious bone lesions identified. IMPRESSION: CT NECK: Limited non contrast assessment. Extensive lymphadenopathy compatible with lymphoma, difficult to differentiate from underlying structures due to lack of contrast. Patent airway. Bilateral orbital retrobulbar masses most consistent with lymphoma. This would be better characterized on MRI of the orbits with contrast on a nonemergent basis. CT CHEST: Limited noncontrast assessment. Extensive lymphadenopathy consistent with  lymphoma. Diffuse centrilobular nodules and tree-in-bud infiltrates most consistent with lymphangitic spread of lymphoma though, and infection could have a similar appearance. Small bilateral pleural effusions are likely malignant. Electronically Signed   By: Elon Alas M.D.   On: 10/24/2015 22:17   Ct Abdomen Pelvis W Contrast  10/24/2015  CLINICAL DATA:  Upper abdominal pain and bloating for 2 months. EXAM: CT ABDOMEN AND PELVIS WITH CONTRAST TECHNIQUE: Multidetector CT imaging of the abdomen and pelvis was performed using the standard protocol following bolus administration of intravenous contrast. CONTRAST:  148mL OMNIPAQUE IOHEXOL 300 MG/ML  SOLN COMPARISON:  None. FINDINGS: Lower chest: Nodularity along the bronchovascular bundles in both lower lobes could be lymphoma in his involvement of the lungs. There are bilateral pleural effusions and overlying atelectasis. There is also a 17 mm epicardial lymph node on image 9. No pericardial effusion. Small pleural nodules are also noted. Retrocrural adenopathy is also noted. Hepatobiliary: Simple appearing hepatic cyst in the left lobe. No worrisome hepatic lesions or intrahepatic biliary dilatation. The gallbladder is unremarkable. No common bile duct dilatation. Pancreas: No mass, inflammation or ductal dilatation. Spleen: Normal size.  No focal lesions. Adrenals/Urinary Tract: The adrenal glands are grossly normal. There is a thick rim soft tissue thickening along the posterior aspect of the left kidney which could be lymphomatous involvement. A left renal calculus is also noted. There is also a a thick rim of soft tissue density along the left renal pelvis extending down along the inferior margin of the kidney which is likely lymphoma. This measures a maximum of 3.5 cm on image number 41. Stomach/Bowel: The stomach is markedly thickened and somewhat nodular and irregular. Findings suspicious for gastric lymphoma. The duodenum, small bowel and colon are  grossly normal. No obvious inflammatory changes or mass lesions. No obstructive findings. The terminal ileum is normal. The appendix is normal. Vascular/Lymphatic: Bulky mesenteric and retroperitoneal lymphadenopathy. Large upper abdominal mesenteric mass measures 12 x 7.5 cm on image number 22. There is also bulky retroperitoneal adenopathy with an index node between the aorta and left kidney measuring 30 x 24 mm on image number 32. There is also bilateral pelvic and inguinal lymphadenopathy. An index right external iliac lymph node on image number 69 measures a maximum of 21 mm short axis. Right inguinal lymph node measures 27 x 16 mm on image number 87. The aorta is normal in caliber. The branch vessels are patent. The major venous structures are patent. Mild compression of the IVC by the bulky adenopathy. Other: The bladder is normal. The prostate gland is mildly enlarged with median lobe hypertrophy impressing on the base of the bladder. The seminal vesicles are normal. No free pelvic fluid collections. Bilateral inguinal hernias containing small bowel loops. Musculoskeletal: No obvious changes of osseous lymphoma. IMPRESSION: 1. Constellation of findings most consistent with lymphoma. 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 2. Suspect lymphomatous involvement of the lungs. There also bilateral pleural effusions and overlying atelectasis. No obvious involvement of the liver or spleen. Bilateral inguinal hernias containing small bowel loops. Electronically Signed   By: Marijo Sanes M.D.   On: 10/24/2015 19:15   US Biopsy  10/25/2015  CLINICAL DATA:  60 year old male with new diagnosis of profound adenopathy concerning for advanced lymphoma. He presents for ultrasound-guided core biopsy to obtain tissue diagnosis. EXAM: ULTRASOUND BIOPSY LYMPH NODE Date:  10/25/2015 PROCEDURE: 1. Ultrasound-guided core biopsy of right superficial inguinal lymph nodes Interventional Radiologist:  Criselda Peaches, MD ANESTHESIA/SEDATION: 50 mcg fentanyl administered intravenously. MEDICATIONS: None additional TECHNIQUE: Informed consent was obtained from the patient following explanation of the procedure, risks, benefits and alternatives. The patient understands, agrees and consents for the procedure. All questions were addressed. A time out was performed. The right groin was interrogated with ultrasound. Multiple enlarged superficial inguinal lymph nodes are identified. The nodes are superficial to the common femoral artery and vein. An appropriate skin entry site was selected and marked. The region was sterilely prepped and draped in standard fashion using chlorhexidine skin prep. Under real-time sonographic guidance, multiple 16 gauge core biopsies were then coaxially obtained. Needle placement was confirmed on all biopsy passes with real-time sonography. Biopsy specimens were placed in saline and delivered to pathology for further analysis. Post biopsy ultrasound imaging demonstrates no active bleeding or hematoma. The patient tolerated the procedure well. COMPLICATIONS: None. Estimated blood loss: 0 IMPRESSION: Technically successful ultrasound-guided core biopsy of superficial right inguinal adenopathy. Signed, Criselda Peaches, MD [facility] Electronically Signed   By: Jacqulynn Cadet M.D.   On: 10/25/2015 14:25    ASSESSMENT & PLAN:   60 year old male with history of retinitis pigmentosa causing legal blindness who is currently on Social Security disability as a result. Otherwise in good physical condition presents with   #1 Generalized lymphadenopathy in his neck, chest and abdomen concerning for likely lymphoma. Also with a thickened nodular-appearing stomach concerning for extensive involvement by  lymphoma. Given his GI involvement this could certainly be marginal zone lymphoma, mantle cell lymphoma or some other form of non-Hodgkin's lymphoma. LDH level is somewhat elevated at 324. Patient has clearly defined constitutional symptoms including chills, weight loss and drenching night sweats.  #2 generalized pruritus and skin rash likely related to his underlying disease causing generalized lymphadenopathy.  Plan -Patient has had several core biopsies of his right inguinal lymph node by interventional radiology today. Sample has also been sent for lymphoma workup. -We'll follow up on his biopsy results in clinic on 10/31/2015. 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 -Referral has been sent to our scheduler to schedule him in clinic on 10/31/2015 with labs and a PET/CT scan. -PET/CT scan ordered to be done as outpatient complete accurate staging of his lymphoma - The pathology result is nondiagnostic patient might need an excisional lymph node biopsy and possible EGD to evaluate his stomach. -Would recommend against using steroids pending definitive diagnosis. -Social worker evaluation to determine the need for home care services and home safety evaluation and to help with setting up medical transportation help to facilitate clinic follow-up .patient notes he was using [employer] [facility] services in the past . -Further management based on PET/CT and biopsy results. -Patient  might eventually need a port and ECHO depending on diagnosis and treatment plan.  Return to care with Dr. Irene Limbo in clinic on 10/31/2015 with labs and PET/CT scan.  All of the patients questions were answered  to his apparent satisfaction. The patient knows to call the clinic with any problems, questions or concerns.  I spent 70 minutes counseling the patient face to face. The total time spent in the appointment  was 80 minutes and more than 50% was on counseling and direct patient cares.    Kristopher Lone MD [place] AAHIVMS [facility] Hematology/Oncology Physician [facility]  (Office):       [phone] (Work cell):  [phone] (Fax):           [phone]  10/25/2015 2:39 PM

## 2015-10-25 NOTE — Sedation Documentation (Signed)
Patient denies pain and is resting comfortably.  

## 2015-10-25 NOTE — Progress Notes (Signed)
D/C instructions reviewed with pt and family/friend. Copy of instructions given to pt. Family/friend taking pt home. Pt d/c'd with belongings, via wheelchair, escorted by unit NT.

## 2015-10-25 NOTE — Telephone Encounter (Signed)
Contacted pt's nurse in ref to np appt on 10/27/15@11 :00

## 2015-10-25 NOTE — Progress Notes (Signed)
Oncology short note  Patient was seen and examined in detail. Has bulky generalized lymphadenopathy with constitutional symptoms highly concerning for lymphoma. He has had a right inguinal lymph node core needle biopsy for lymphoma workup. he has a history of retinitis pigmentosa and is legally blind and has significant limitations with regards to transportation.  Plan -I have sent a request to our schedulers so that the patient can follow up with me in clinic with a PET/CT scan. -Further management will be defined in clinic as per his biopsy results and PET/CT scan. -Social worker input as inpatient to help him set up clinic transportation services to allow for more reliable follow-up will be appreciated. -Patient might potentially benefit from home safety evaluation and home care services given his significant vision impairment and declined physical status and questionable ability to take care of himself and follow-up for clinical cares. -full consultation will be dictated shortly. -Appreciated excellent care by the hospitalist team. -The patient is stable with no issues with bleeding post inguinal lymph node biopsy might be discharged to home from oncology standpoint with home safety evaluation/home care services/clinic transportation input by SW. Patient notes that he has used [employer] parat[facility]it services in the past.  Sullivan Lone MD [place] AAHIVMS [facility] Hematology/Oncology Physician [place]  (Office):       [phone] (Work cell):  [phone] (Fax):           [phone]

## 2015-10-26 LAB — HIV ANTIBODY (ROUTINE TESTING W REFLEX): HIV SCREEN 4TH GENERATION: NONREACTIVE

## 2015-10-26 LAB — HEMOGLOBIN A1C
Hgb A1c MFr Bld: 6.2 % — ABNORMAL HIGH (ref 4.8–5.6)
Mean Plasma Glucose: 131 mg/dL

## 2015-10-26 LAB — HEPATITIS B SURFACE ANTIGEN: Hepatitis B Surface Ag: NEGATIVE

## 2015-10-26 LAB — HEPATITIS B SURFACE ANTIBODY,QUALITATIVE: Hep B S Ab: NONREACTIVE

## 2015-10-26 LAB — HEPATITIS C ANTIBODY (REFLEX): HCV Ab: 0.2 s/co ratio (ref 0.0–0.9)

## 2015-10-26 LAB — HCV COMMENT:

## 2015-10-27 ENCOUNTER — Telehealth: Payer: Self-pay | Admitting: Hematology and Oncology

## 2015-10-27 ENCOUNTER — Telehealth: Payer: Self-pay | Admitting: Hematology

## 2015-10-27 ENCOUNTER — Other Ambulatory Visit: Payer: Self-pay | Admitting: Hematology

## 2015-10-27 ENCOUNTER — Encounter: Payer: Self-pay | Admitting: Hematology and Oncology

## 2015-10-27 ENCOUNTER — Ambulatory Visit (HOSPITAL_BASED_OUTPATIENT_CLINIC_OR_DEPARTMENT_OTHER): Payer: Medicare Other | Admitting: Hematology and Oncology

## 2015-10-27 VITALS — BP 133/62 | HR 105 | Temp 97.4°F | Resp 18 | Ht 69.0 in | Wt 184.7 lb

## 2015-10-27 DIAGNOSIS — L989 Disorder of the skin and subcutaneous tissue, unspecified: Secondary | ICD-10-CM | POA: Diagnosis not present

## 2015-10-27 DIAGNOSIS — R599 Enlarged lymph nodes, unspecified: Secondary | ICD-10-CM

## 2015-10-27 DIAGNOSIS — Z720 Tobacco use: Secondary | ICD-10-CM | POA: Diagnosis not present

## 2015-10-27 DIAGNOSIS — R591 Generalized enlarged lymph nodes: Secondary | ICD-10-CM

## 2015-10-27 DIAGNOSIS — C8598 Non-Hodgkin lymphoma, unspecified, lymph nodes of multiple sites: Secondary | ICD-10-CM

## 2015-10-27 DIAGNOSIS — K4020 Bilateral inguinal hernia, without obstruction or gangrene, not specified as recurrent: Secondary | ICD-10-CM | POA: Diagnosis not present

## 2015-10-27 DIAGNOSIS — L02413 Cutaneous abscess of right upper limb: Secondary | ICD-10-CM | POA: Diagnosis not present

## 2015-10-27 DIAGNOSIS — IMO0001 Reserved for inherently not codable concepts without codable children: Secondary | ICD-10-CM | POA: Insufficient documentation

## 2015-10-27 DIAGNOSIS — R0602 Shortness of breath: Secondary | ICD-10-CM

## 2015-10-27 HISTORY — DX: Non-Hodgkin lymphoma, unspecified, lymph nodes of multiple sites: C85.98

## 2015-10-27 HISTORY — DX: Shortness of breath: R06.02

## 2015-10-27 HISTORY — DX: Cutaneous abscess of right upper limb: L02.413

## 2015-10-27 NOTE — Telephone Encounter (Signed)
perp of to sch pt appt-per pof to sch ECHO-sent to [facility] for pre-cert-will call pt after reply

## 2015-10-27 NOTE — Telephone Encounter (Signed)
Regarding 1/17 dr Irene Limbo order,he was going to be a new patient for him but then it was noticed that he had seen dr Alvy Bimler in the past and she is seeing him today,pof closed

## 2015-10-27 NOTE — Assessment & Plan Note (Signed)
I have discussed the pathology report with the hemato-pathologist who confirmed that the biopsy is suspicious for lymphoma. Additional test has been ordered, final result will not be available until at least tomorrow. I will proceed to order a PET/CT scan for staging. He would also benefit from echocardiogram. 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

## 2015-10-27 NOTE — Assessment & Plan Note (Signed)
He has large abscess on the right arm that would need incision and drainage. I will refer him to general surgery as soon as possible.

## 2015-10-27 NOTE — Progress Notes (Signed)
I spoke with Dr. Irene Limbo He will take over the care of this patient. I apologize that I was not aware that the patient has already seen Dr. Irene Limbo in the hospital

## 2015-10-27 NOTE — Progress Notes (Signed)
[facility] [place] CONSULT NOTE  Patient Care Team: No Pcp Per Patient as PCP - General (General Practice)  CHIEF COMPLAINTS/PURPOSE OF CONSULTATION:  Lymphoma  HISTORY OF PRESENTING ILLNESS:  Please see detailed consultation from Dr. Irene Limbo dated 10/25/2015 for further details. This patient presented with abdominal pain, diffuse lymphadenopathy, anorexia and 30 pound weight loss since November 2016. He was admitted to the hospital briefly for expedited workup and underwent core needle biopsy of the right inguinal region. Biopsies pending suspicious for lymphoma He also developed multiple sores and drainage from both arms. He denies significant pain requiring pain medicine He has large bilateral inguinal hernia and complained of urinary retention.  MEDICAL HISTORY:  Past Medical History  Diagnosis Date  . Retinitis pigmentosa     Patient notes that he has been declared legally blind since 1983 and is on Social Security disability  . Abscess of arm, right 10/27/2015  . 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 Shortness of breath 10/27/2015    SURGICAL HISTORY: Past Surgical History  Procedure Laterality Date  . Tonsillectomy    . Cataract surgery      Patient notes she has had bilateral Surgery in 1998 and 99    SOCIAL HISTORY: Social History   Social History  . Marital Status: Widowed    Spouse Name: N/A  . Number of Children: N/A  . Years of Education: N/A   Occupational History  . Not on file.   Social History Main Topics  . Smoking status: Current Every Day Smoker -- 1.00 packs/day for 33 years  . Smokeless tobacco: Never Used  . Alcohol Use: No  . Drug Use: No  . Sexual Activity: Not on file     Comment: Disabled, retinis pigmentosa legally blind, 4children MPOA Lynnn(cousin)   Other Topics Concern  . Not on file   Social History Narrative    FAMILY HISTORY: History reviewed. No pertinent family history.  ALLERGIES:  has No  Known Allergies.  MEDICATIONS:  No current outpatient prescriptions on file.   No current facility-administered medications for this visit.    REVIEW OF SYSTEMS:   Constitutional: Denies fevers, chills or abnormal night sweats Eyes: Denies blurriness of vision, double vision or watery eyes Ears, nose, mouth, throat, and face: Denies mucositis or sore throat Respiratory: Denies cough, dyspnea or wheezes Cardiovascular: Denies palpitation, chest discomfort or lower extremity swelling Gastrointestinal:  Denies nausea, heartburn or change in bowel habits Neurological:Denies numbness, tingling or new weaknesses Behavioral/Psych: Mood is stable, no new changes  All other systems were reviewed with the patient and are negative.  PHYSICAL EXAMINATION: ECOG PERFORMANCE STATUS: 1 - Symptomatic but completely ambulatory  Filed Vitals:   10/27/15 1114  BP: 133/62  Pulse: 105  Temp: 97.4 F (36.3 C)  Resp: 18   Filed Weights   10/27/15 1114  Weight: 184 lb 11.2 oz (83.779 kg)    GENERAL:alert, no distress and comfortable SKIN: he has multiple open sores on both arms, one of them is quite large measuring at least 6 cm in size and is draining serosanguineous fluid EYES: normal, conjunctiva are pink and non-injected, sclera clear OROPHARYNX:no exudate, no erythema and lips, buccal mucosa, and tongue normal . He has poor dental hygiene NECK: supple, thyroid normal size, non-tender, without nodularity LYMPH:  He has diffuse lymphadenopathy LUNGS: clear to auscultation and percussion with normal breathing effort HEART: regular rate & rhythm and no murmurs and no lower extremity edema ABDOMEN:he has palpable  epigastric mass Musculoskeletal:no cyanosis of digits and no clubbing  PSYCH: alert & oriented x 3 with fluent speech NEURO: no focal motor/sensory deficits  LABORATORY DATA:  I have reviewed the data as listed Lab Results  Component Value Date   WBC 9.1 10/25/2015   HGB 11.8*  10/25/2015   HCT 37.4* 10/25/2015   MCV 84.6 10/25/2015   PLT 219 10/25/2015    Recent Labs  10/24/15 1426  NA 137  K 4.2  CL 101  CO2 25  GLUCOSE 178*  BUN 11  CREATININE 1.10  CALCIUM 9.0  GFRNONAA >60  GFRAA >60  PROT 6.2*  ALBUMIN 2.9*  AST 24  ALT 16*  ALKPHOS 84  BILITOT 0.4    RADIOGRAPHIC STUDIES: I have personally reviewed the radiological images as listed and agreed with the findings in the report. Ct Soft Tissue Neck W Contrast  10/24/2015  CLINICAL DATA:  Concerning for lymphoma. Abdominal pain, urinary retention. Imaging today demonstrates possible lymphoma. EXAM: CT NECK AND CHEST WITHOUT CONTRAST COMPARISON:  None. FINDINGS: CT NECK FINDINGS (limited assessment without contrast) Pharynx and larynx: Normal. Salivary glands: Normal. Thyroid: Probable lymphadenopathy versus thyromegaly, difficult to discern without contrast. Lymph nodes: Lymphadenopathy along the cervical changing, including 14 mm short access RIGHT level IIa lymph node, multiple presumed lymph nodes level 3 and 4. Probable LEFT supraclavicular lymphadenopathy though, difficult to differentiate from vascular structures. Vascular: Limited assessment. Limited intracranial: Normal. Visualized orbits: Abnormal soft tissue within the retrobulbar fat at the optic nerve insertion, LEFT greater than RIGHT. The ocular globes are intact, status post bilateral ocular lens implants. Extraocular muscles are unremarkable. Preservation of orbital fat. Mastoids and visualized paranasal sinuses: Well aerated. Skeleton: No destructive bony lesions. Mild to moderate degenerative change of the cervical spine. CT CHEST FINDINGS Mediastinum/Lymph Nodes: Mediastinal lymphadenopathy, including LEFT peritracheal at least 22 mm nodal conglomeration. Bilateral presumed hilar lymphadenopathy. Internal mammary chain lymphadenopathy on the RIGHT measuring up to 14 mm. Bulky bilateral axillary lymphadenopathy measuring up to 28 mm short  access. 19 mm short access cardio phrenic angle lymph node. Heart size normal. No pericardial effusions. Lungs/Pleura: Innumerable, diffuse subcentimeter centrilobular nodules and tree-in-bud infiltrates. Small bilateral pleural effusions with compressive atelectasis. Tracheobronchial tree is patent and midline. No pneumothorax. Musculoskeletal: No chest wall mass or suspicious bone lesions identified. IMPRESSION: CT NECK: Limited non contrast assessment. Extensive lymphadenopathy compatible with lymphoma, difficult to differentiate from underlying structures due to lack of contrast. Patent airway. Bilateral orbital retrobulbar masses most consistent with lymphoma. This would be better characterized on MRI of the orbits with contrast on a nonemergent basis. CT CHEST: Limited noncontrast assessment. Extensive lymphadenopathy consistent with lymphoma. Diffuse centrilobular nodules and tree-in-bud infiltrates most consistent with lymphangitic spread of lymphoma though, and infection could have a similar appearance. Small bilateral pleural effusions are likely malignant. Electronically Signed   By: Elon Alas M.D.   On: 10/24/2015 22:17   Ct Chest W Contrast  10/24/2015  CLINICAL DATA:  Concerning for lymphoma. Abdominal pain, urinary retention. Imaging today demonstrates possible lymphoma. EXAM: CT NECK AND CHEST WITHOUT CONTRAST COMPARISON:  None. FINDINGS: CT NECK FINDINGS (limited assessment without contrast) Pharynx and larynx: Normal. Salivary glands: Normal. Thyroid: Probable lymphadenopathy versus thyromegaly, difficult to discern without contrast. Lymph nodes: Lymphadenopathy along the cervical changing, including 14 mm short access RIGHT level IIa lymph node, multiple presumed lymph nodes level 3 and 4. Probable LEFT supraclavicular lymphadenopathy though, difficult to differentiate from vascular structures. Vascular: Limited assessment. Limited intracranial: Normal. Visualized  orbits: Abnormal soft  tissue within the retrobulbar fat at the optic nerve insertion, LEFT greater than RIGHT. The ocular globes are intact, status post bilateral ocular lens implants. Extraocular muscles are unremarkable. Preservation of orbital fat. Mastoids and visualized paranasal sinuses: Well aerated. Skeleton: No destructive bony lesions. Mild to moderate degenerative change of the cervical spine. CT CHEST FINDINGS Mediastinum/Lymph Nodes: Mediastinal lymphadenopathy, including LEFT peritracheal at least 22 mm nodal conglomeration. Bilateral presumed hilar lymphadenopathy. Internal mammary chain lymphadenopathy on the RIGHT measuring up to 14 mm. Bulky bilateral axillary lymphadenopathy measuring up to 28 mm short access. 19 mm short access cardio phrenic angle lymph node. Heart size normal. No pericardial effusions. Lungs/Pleura: Innumerable, diffuse subcentimeter centrilobular nodules and tree-in-bud infiltrates. Small bilateral pleural effusions with compressive atelectasis. Tracheobronchial tree is patent and midline. No pneumothorax. Musculoskeletal: No chest wall mass or suspicious bone lesions identified. IMPRESSION: CT NECK: Limited non contrast assessment. Extensive lymphadenopathy compatible with lymphoma, difficult to differentiate from underlying structures due to lack of contrast. Patent airway. Bilateral orbital retrobulbar masses most consistent with lymphoma. This would be better characterized on MRI of the orbits with contrast on a nonemergent basis. CT CHEST: Limited noncontrast assessment. Extensive lymphadenopathy consistent with lymphoma. Diffuse centrilobular nodules and tree-in-bud infiltrates most consistent with lymphangitic spread of lymphoma though, and infection could have a similar appearance. Small bilateral pleural effusions are likely malignant. Electronically Signed   By: Elon Alas M.D.   On: 10/24/2015 22:17   Ct Abdomen Pelvis W Contrast  10/24/2015  CLINICAL DATA:  Upper abdominal  pain and bloating for 2 months. EXAM: CT ABDOMEN AND PELVIS WITH CONTRAST TECHNIQUE: Multidetector CT imaging of the abdomen and pelvis was performed using the standard protocol following bolus administration of intravenous contrast. CONTRAST:  154mL OMNIPAQUE IOHEXOL 300 MG/ML  SOLN COMPARISON:  None. FINDINGS: Lower chest: Nodularity along the bronchovascular bundles in both lower lobes could be lymphoma in his involvement of the lungs. There are bilateral pleural effusions and overlying atelectasis. There is also a 17 mm epicardial lymph node on image 9. No pericardial effusion. Small pleural nodules are also noted. Retrocrural adenopathy is also noted. Hepatobiliary: Simple appearing hepatic cyst in the left lobe. No worrisome hepatic lesions or intrahepatic biliary dilatation. The gallbladder is unremarkable. No common bile duct dilatation. Pancreas: No mass, inflammation or ductal dilatation. Spleen: Normal size.  No focal lesions. Adrenals/Urinary Tract: The adrenal glands are grossly normal. There is a thick rim soft tissue thickening along the posterior aspect of the left kidney which could be lymphomatous involvement. A left renal calculus is also noted. There is also a a thick rim of soft tissue density along the left renal pelvis extending down along the inferior margin of the kidney which is likely lymphoma. This measures a maximum of 3.5 cm on image number 41. Stomach/Bowel: The stomach is markedly thickened and somewhat nodular and irregular. Findings suspicious for gastric lymphoma. The duodenum, small bowel and colon are grossly normal. No obvious inflammatory changes or mass lesions. No obstructive findings. The terminal ileum is normal. The appendix is normal. Vascular/Lymphatic: Bulky mesenteric and retroperitoneal lymphadenopathy. Large upper abdominal mesenteric mass measures 12 x 7.5 cm on image number 22. There is also bulky retroperitoneal adenopathy with an index node between the aorta and  left kidney measuring 30 x 24 mm on image number 32. There is also bilateral pelvic and inguinal lymphadenopathy. An index right external iliac lymph node on image number 69 measures a maximum of 21 mm short  axis. Right inguinal lymph node measures 27 x 16 mm on image number 87. The aorta is normal in caliber. The branch vessels are patent. The major venous structures are patent. Mild compression of the IVC by the bulky adenopathy. Other: The bladder is normal. The prostate gland is mildly enlarged with median lobe hypertrophy impressing on the base of the bladder. The seminal vesicles are normal. No free pelvic fluid collections. Bilateral inguinal hernias containing small bowel loops. Musculoskeletal: No obvious changes of osseous lymphoma. IMPRESSION: 1. Constellation of findings most consistent with lymphoma. 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 2. Suspect lymphomatous involvement of the lungs. There also bilateral pleural effusions and overlying atelectasis. No obvious involvement of the liver or spleen. Bilateral inguinal hernias containing small bowel loops. Electronically Signed   By: Marijo Sanes M.D.   On: 10/24/2015 19:15   US Biopsy  10/25/2015  CLINICAL DATA:  60 year old male with new diagnosis of profound adenopathy concerning for advanced lymphoma. He presents for ultrasound-guided core biopsy to obtain tissue diagnosis. EXAM: ULTRASOUND BIOPSY LYMPH NODE Date: 10/25/2015 PROCEDURE: 1. Ultrasound-guided core biopsy of right superficial inguinal lymph nodes Interventional Radiologist:  Criselda Peaches, MD ANESTHESIA/SEDATION: 50 mcg fentanyl administered intravenously. MEDICATIONS: None additional TECHNIQUE: Informed consent was obtained from the patient following explanation of the procedure, risks, benefits and alternatives. The  patient understands, agrees and consents for the procedure. All questions were addressed. A time out was performed. The right groin was interrogated with ultrasound. Multiple enlarged superficial inguinal lymph nodes are identified. The nodes are superficial to the common femoral artery and vein. An appropriate skin entry site was selected and marked. The region was sterilely prepped and draped in standard fashion using chlorhexidine skin prep. Under real-time sonographic guidance, multiple 16 gauge core biopsies were then coaxially obtained. Needle placement was confirmed on all biopsy passes with real-time sonography. Biopsy specimens were placed in saline and delivered to pathology for further analysis. Post biopsy ultrasound imaging demonstrates no active bleeding or hematoma. The patient tolerated the procedure well. COMPLICATIONS: None. Estimated blood loss: 0 IMPRESSION: Technically successful ultrasound-guided core biopsy of superficial right inguinal adenopathy. Signed, Criselda Peaches, MD [facility] Electronically Signed   By: Jacqulynn Cadet M.D.   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 Additional test has been ordered, final result will not be available until at least tomorrow. I will proceed to order a PET/CT scan for staging. He would also benefit from echocardiogram. 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   Abscess of arm, right He has large abscess on the right arm that would need incision and drainage. I will refer him to general surgery as soon as possible.   Orders Placed This Encounter  Procedures  . NM PET Image Initial (PI) Skull Base To Thigh    Standing Status: Future      Number of Occurrences:      Standing Expiration Date: 12/26/2016    Order Specific Question:  Reason for Exam (SYMPTOM  OR DIAGNOSIS REQUIRED)    Answer:  staging lymphoma    Order Specific Question:  Preferred imaging location?    Answer:  [facility]  . Ambulatory referral to General Surgery    Referral Priority:  Urgent    Referral Type:  Surgical    Referral Reason:  Specialty Services Required    Requested Specialty:  General Surgery    Number of Visits Requested:  1  . ECHOCARDIOGRAM COMPLETE    Standing Status: Future     Number of Occurrences:      Standing Expiration Date: 01/25/2017    Order Specific Question:  Where should this test be performed    Answer:  [facility]    Order Specific Question:  Complete or Limited study?    Answer:  Complete    Order Specific Question:  With Image Enhancing Agent or without Image Enhancing Agent?    Answer:  Without Image Enhancing Agent    Order Specific Question:  Contraindication for Image Enhancing Agent?    Answer:  Per Appointment Conversion    Order Specific Question:  Reason for exam-Echo    Answer:  Chemo  V67.2 / Z09     All questions were answered. The patient knows to call the clinic with any problems, questions or concerns. I spent 40 minutes counseling the patient face to face. The total time spent in the appointment was 55 minutes and more than 50% was on counseling.     [place], [place], MD 10/27/2015 11:54 AM

## 2015-10-27 NOTE — Telephone Encounter (Signed)
cld pt and left a message of time/date /location of ECHO for 1/24 and appt on same day @ 10:15 @ [facility]

## 2015-10-28 ENCOUNTER — Telehealth: Payer: Self-pay | Admitting: *Deleted

## 2015-10-28 NOTE — Telephone Encounter (Signed)
Pt states he saw MD yesterday after Dr Alvy Bimler. They started him on antibiotics and told him he needs to go to a dermatologist. They did NOT refer him to anyone...  Also needs a letter faxed to [employer] for transportation assistance

## 2015-10-28 NOTE — Telephone Encounter (Signed)
PET scan is ordered I would hold off derm consult Dr. Irene Limbo is his primary from now on

## 2015-10-28 NOTE — Telephone Encounter (Signed)
Pt notified that Dr Irene Limbo will be his MD. Has appt on 11/01/15. Dr Alvy Bimler said to hold off on dermatologist until he sees Dr Irene Limbo

## 2015-10-31 ENCOUNTER — Other Ambulatory Visit: Payer: Self-pay | Admitting: Hematology

## 2015-11-01 ENCOUNTER — Other Ambulatory Visit (HOSPITAL_BASED_OUTPATIENT_CLINIC_OR_DEPARTMENT_OTHER): Payer: Medicare Other

## 2015-11-01 ENCOUNTER — Ambulatory Visit (HOSPITAL_BASED_OUTPATIENT_CLINIC_OR_DEPARTMENT_OTHER): Payer: Medicare Other | Admitting: Hematology

## 2015-11-01 ENCOUNTER — Ambulatory Visit (HOSPITAL_COMMUNITY)
Admission: RE | Admit: 2015-11-01 | Discharge: 2015-11-01 | Disposition: A | Discharge status: Home / Self Care | Payer: Medicare Other | Source: Ambulatory Visit | Attending: Hematology and Oncology | Admitting: Hematology and Oncology

## 2015-11-01 ENCOUNTER — Telehealth: Payer: Self-pay | Admitting: *Deleted

## 2015-11-01 ENCOUNTER — Telehealth: Payer: Self-pay | Admitting: Hematology

## 2015-11-01 VITALS — BP 145/68 | HR 102 | Temp 98.1°F | Resp 18 | Ht 69.0 in | Wt 181.6 lb

## 2015-11-01 DIAGNOSIS — Z720 Tobacco use: Secondary | ICD-10-CM | POA: Diagnosis not present

## 2015-11-01 DIAGNOSIS — C8318 Mantle cell lymphoma, lymph nodes of multiple sites: Secondary | ICD-10-CM

## 2015-11-01 DIAGNOSIS — C8598 Non-Hodgkin lymphoma, unspecified, lymph nodes of multiple sites: Secondary | ICD-10-CM

## 2015-11-01 DIAGNOSIS — R339 Retention of urine, unspecified: Secondary | ICD-10-CM

## 2015-11-01 DIAGNOSIS — E46 Unspecified protein-calorie malnutrition: Secondary | ICD-10-CM | POA: Diagnosis not present

## 2015-11-01 DIAGNOSIS — IMO0001 Reserved for inherently not codable concepts without codable children: Secondary | ICD-10-CM

## 2015-11-01 DIAGNOSIS — H3552 Pigmentary retinal dystrophy: Secondary | ICD-10-CM | POA: Diagnosis not present

## 2015-11-01 DIAGNOSIS — C8310 Mantle cell lymphoma, unspecified site: Secondary | ICD-10-CM

## 2015-11-01 DIAGNOSIS — L989 Disorder of the skin and subcutaneous tissue, unspecified: Secondary | ICD-10-CM | POA: Diagnosis not present

## 2015-11-01 DIAGNOSIS — R634 Abnormal weight loss: Secondary | ICD-10-CM | POA: Diagnosis not present

## 2015-11-01 DIAGNOSIS — R0602 Shortness of breath: Secondary | ICD-10-CM | POA: Diagnosis not present

## 2015-11-01 DIAGNOSIS — R591 Generalized enlarged lymph nodes: Secondary | ICD-10-CM | POA: Diagnosis present

## 2015-11-01 LAB — CBC & DIFF AND RETIC
BASO%: 4.4 % — ABNORMAL HIGH (ref 0.0–2.0)
BASOS ABS: 0.5 10*3/uL — AB (ref 0.0–0.1)
EOS ABS: 0.5 10*3/uL (ref 0.0–0.5)
EOS%: 4 % (ref 0.0–7.0)
HCT: 40.2 % (ref 38.4–49.9)
HEMOGLOBIN: 12.9 g/dL — AB (ref 13.0–17.1)
Immature Retic Fract: 7.4 % (ref 3.00–10.60)
LYMPH#: 1.1 10*3/uL (ref 0.9–3.3)
LYMPH%: 9.6 % — ABNORMAL LOW (ref 14.0–49.0)
MCH: 26.6 pg — AB (ref 27.2–33.4)
MCHC: 32.1 g/dL (ref 32.0–36.0)
MCV: 82.9 fL (ref 79.3–98.0)
MONO#: 1.5 10*3/uL — AB (ref 0.1–0.9)
MONO%: 13.3 % (ref 0.0–14.0)
NEUT%: 68.7 % (ref 39.0–75.0)
NEUTROS ABS: 7.7 10*3/uL — AB (ref 1.5–6.5)
Platelets: 240 10*3/uL (ref 140–400)
RBC: 4.85 10*6/uL (ref 4.20–5.82)
RDW: 15.4 % — AB (ref 11.0–14.6)
RETIC %: 1.47 % (ref 0.80–1.80)
RETIC CT ABS: 71.3 10*3/uL (ref 34.80–93.90)
WBC: 11.2 10*3/uL — AB (ref 4.0–10.3)

## 2015-11-01 LAB — COMPREHENSIVE METABOLIC PANEL
ALBUMIN: 2.9 g/dL — AB (ref 3.5–5.0)
ALT: 12 U/L (ref 0–55)
AST: 19 U/L (ref 5–34)
Alkaline Phosphatase: 91 U/L (ref 40–150)
Anion Gap: 8 mEq/L (ref 3–11)
BUN: 15.2 mg/dL (ref 7.0–26.0)
CHLORIDE: 101 meq/L (ref 98–109)
CO2: 25 mEq/L (ref 22–29)
CREATININE: 1.2 mg/dL (ref 0.7–1.3)
Calcium: 9.2 mg/dL (ref 8.4–10.4)
EGFR: 79 mL/min/{1.73_m2} — ABNORMAL LOW (ref >90)
GLUCOSE: 113 mg/dL (ref 70–140)
POTASSIUM: 4.9 meq/L (ref 3.5–5.1)
SODIUM: 134 meq/L — AB (ref 136–145)
Total Bilirubin: 0.35 mg/dL (ref 0.20–1.20)
Total Protein: 6.8 g/dL (ref 6.4–8.3)

## 2015-11-01 LAB — LACTATE DEHYDROGENASE: LDH: 441 U/L — ABNORMAL HIGH (ref 125–245)

## 2015-11-01 MED ORDER — PROCHLORPERAZINE MALEATE 10 MG PO TABS: 10.0000 mg | ORAL_TABLET | Freq: Four times a day (QID) | ORAL | Status: DC | PRN

## 2015-11-01 MED ORDER — DEXAMETHASONE 4 MG PO TABS: 4.0000 mg | ORAL_TABLET | Freq: Two times a day (BID) | ORAL | Status: DC

## 2015-11-01 MED ORDER — ONDANSETRON HCL 8 MG PO TABS: 8.0000 mg | ORAL_TABLET | Freq: Two times a day (BID) | ORAL | Status: DC | PRN

## 2015-11-01 MED ORDER — PREDNISONE 20 MG PO TABS: 40.0000 mg/m2 | ORAL_TABLET | Freq: Every day | ORAL | Status: DC

## 2015-11-01 MED ORDER — LORAZEPAM 0.5 MG PO TABS: 0.5000 mg | ORAL_TABLET | Freq: Four times a day (QID) | ORAL | Status: DC | PRN

## 2015-11-01 MED ORDER — ALLOPURINOL 300 MG PO TABS: 300.0000 mg | ORAL_TABLET | Freq: Every day | ORAL | Status: DC

## 2015-11-01 NOTE — Telephone Encounter (Signed)
per reply no care plan-sent Dr Irene Limbo email to enter trmt plan so pt can be sch [facility] Sharyn Lull

## 2015-11-01 NOTE — Telephone Encounter (Signed)
Per staff message and POF I have tried  scheduled appts, but no care plan available. Advised scheduler of appts. JMW

## 2015-11-01 NOTE — Telephone Encounter (Signed)
per pof to sch pt appt-sent MW email to sch pt trmt-pt to get updated copy of avs on 1/27

## 2015-11-01 NOTE — Progress Notes (Signed)
  Echocardiogram 2D Echocardiogram has been performed.  Darlina Sicilian M 11/01/2015, 10:02 AM

## 2015-11-02 ENCOUNTER — Telehealth: Payer: Self-pay | Admitting: *Deleted

## 2015-11-02 NOTE — Telephone Encounter (Signed)
Per staff message and POF I have scheduled appts. Advised scheduler of appts and that the MD visit on 1/30 is to alte to start treamtent. JMW

## 2015-11-02 NOTE — Telephone Encounter (Signed)
Per staff message and POF I have scheduled appts. Advised scheduler of appts. JMW  

## 2015-11-03 ENCOUNTER — Encounter: Payer: Self-pay | Admitting: Hematology

## 2015-11-03 NOTE — Progress Notes (Signed)
[name] Kitchen    HEMATOLOGY/ONCOLOGY CONSULTATION NOTE  Date of Service: .11/01/2015  Patient Care Team: No Pcp Per Patient as PCP - General (General Practice)  CHIEF COMPLAINTS/PURPOSE OF CONSULTATION:  Post hospitalization followup for New Diagnosed Mantle Cell lymphoma  HISTORY OF PRESENTING ILLNESS:   Kristopher Johnson is a wonderful 60 y.o. male who here for a post hospitalization followup for newly diagnosed mantle cell lymphoma.   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 He was following up with retinal specialist and was last evaluated for this in 2005 as per his report. Patient notes no other chronic medical issues. Has not been in the care of her primary care physician. He notes that his poor vision significantly limits his transportation options. He has still been living alone and has been able to maintain his independence.  Patient notes that since Thanksgiving he has developed progressive fatigue, drenching night sweats and weight loss of about 15-20 pounds. He has also noted some increasing shortness of breath, abdominal distention, early satiety, decreased appetite and difficulty passing urine. He notes that he has had a right-sided inguinal hernia for several years but has been increasing in size over the last 6 months to year.  He also notes that he has developed significant new generalized pruritus and skin ulcers on his upper extremities and back. Notes some enlargement of lymph glands in his neck. No significant difficulty swallowing.  Patient decided to come to the emergency room after he had significant difficulty with passing urine and had to force it out. In the emergency room the patient had a CT of the abdomen that showed large upper abdominal mass, mesenteric lymphadenopathy, retroperitoneal adenopathy. His LDH level was elevated to 324. He had a CT of the chest and neck  which also showed significant lymphadenopathy. He was admitted for an expedited workup given his poor medical set up and visual issues as well as the urgency with diagnosis given extensive disease. Patient had several core needle biopsies of his enlarged right inguinal lymph node which was consistent with mantle cell lymphoma.  Patient notes that he still has significant abdominal fullness and cannot each much and still losing weight.  No chest pain or overt shortness of breath.  No headaches.  No blood in the stools.  No intractable nausea vomiting.  Patient had an echocardiogram this morning that showed a normal ejection fraction.  He is here to discuss further treatment options.   MEDICAL HISTORY:  Past Medical History  Diagnosis Date  . Retinitis pigmentosa     Patient notes that he has been declared legally blind since 1983 and is on Social Security disability  . Abscess of arm, right 10/27/2015  . Lymphoma of lymph nodes of multiple sites ([place]) 10/27/2015  . Shortness of breath 10/27/2015    SURGICAL HISTORY: Past Surgical History  Procedure Laterality Date  . Tonsillectomy    . Cataract surgery      Patient notes she has had bilateral Surgery in 1998 and 99    SOCIAL HISTORY: Social History   Social History  . Marital Status: Widowed    Spouse Name: N/A  . Number of Children: N/A  . Years of Education: N/A   Occupational History  . Not on file.   Social History Main Topics  . Smoking status: Current Every Day Smoker -- 1.00 packs/day for 33 years  . Smokeless tobacco: Never Used  .  Alcohol Use: No  . Drug Use: No  . Sexual Activity: Not on file     Comment: Disabled, retinis pigmentosa legally blind, 4children MPOA Lynnn(cousin)   Other Topics Concern  . Not on file   Social History Narrative    FAMILY HISTORY: No family history on file.  ALLERGIES:  has No Known Allergies.  MEDICATIONS:  Current Outpatient Prescriptions  Medication Sig Dispense Refill    . allopurinol (ZYLOPRIM) 300 MG tablet Take 1 tablet (300 mg total) by mouth daily. 30 tablet 0  . dexamethasone (DECADRON) 4 MG tablet Take 1 tablet (4 mg total) by mouth 2 (two) times daily with breakfast and lunch. 60 tablet 0  . LORazepam (ATIVAN) 0.5 MG tablet Take 1 tablet (0.5 mg total) by mouth every 6 (six) hours as needed (Nausea or vomiting). 30 tablet 0  . ondansetron (ZOFRAN) 8 MG tablet Take 1 tablet (8 mg total) by mouth 2 (two) times daily as needed for refractory nausea / vomiting. 30 tablet 1  . predniSONE (DELTASONE) 20 MG tablet Take 4 tablets (80 mg total) by mouth daily. Take on days 1-5 of chemotherapy. 60 tablet 1  . prochlorperazine (COMPAZINE) 10 MG tablet Take 1 tablet (10 mg total) by mouth every 6 (six) hours as needed (Nausea or vomiting). 30 tablet 6   No current facility-administered medications for this visit.    REVIEW OF SYSTEMS:    10 Point review of Systems was done is negative except as noted above.  PHYSICAL EXAMINATION: ECOG PERFORMANCE STATUS: 2 - Symptomatic, <50% confined to bed  . Filed Vitals:   11/01/15 1029  BP: 145/68  Pulse: 102  Temp: 98.1 F (36.7 C)  Resp: 18   Filed Weights   11/01/15 1029  Weight: 181 lb 9.6 oz (82.373 kg)   .Body mass index is 26.81 kg/(m^2).  GENERAL:alert, in no acute distress and comfortable SKIN: skin color, texture, turgor are normal, no rashes or significant lesions EYES: normal, conjunctiva are pink and non-injected, sclera clear. Poor visual acuity - some directional light perception but difficulty with finger counting. OROPHARYNX:no exudate, no erythema and lips, buccal mucosa, and tongue normal  NECK: supple, no JVD, thyroid normal size, non-tender, without nodularity LYMPH:  no palpable lymphadenopathy in the cervical, axillary or inguinal LUNGS: clear to auscultation with normal respiratory effort HEART: regular rate & rhythm,  no murmurs and no lower extremity edema ABDOMEN: abdomen soft,  non-tender, normoactive bowel sounds  Musculoskeletal: no cyanosis of digits and no clubbing  PSYCH: alert & oriented x 3 with fluent speech NEURO: no focal motor/sensory deficits  LABORATORY DATA:  I have reviewed the data as listed  . CBC Latest Ref Rng 11/01/2015 10/25/2015 10/25/2015  WBC 4.0 - 10.3 10e3/uL 11.2(H) 9.1 10.2  Hemoglobin 13.0 - 17.1 g/dL 12.9(L) 11.8(L) 12.2(L)  Hematocrit 38.4 - 49.9 % 40.2 37.4(L) 38.3(L)  Platelets 140 - 400 10e3/uL 240 219 232    . CMP Latest Ref Rng 11/01/2015 10/24/2015  Glucose 70 - 140 mg/dl 113 178(H)  BUN 7.0 - 26.0 mg/dL 15.2 11  Creatinine 0.7 - 1.3 mg/dL 1.2 1.10  Sodium 136 - 145 mEq/L 134(L) 137  Potassium 3.5 - 5.1 mEq/L 4.9 4.2  Chloride 101 - 111 mmol/L - 101  CO2 22 - 29 mEq/L 25 25  Calcium 8.4 - 10.4 mg/dL 9.2 9.0  Total Protein 6.4 - 8.3 g/dL 6.8 6.2(L)  Total Bilirubin 0.20 - 1.20 mg/dL 0.35 0.4  Alkaline Phos 40 - 150 U/L  91 84  AST 5 - 34 U/L 19 24  ALT 0 - 55 U/L 12 16(L)   Component     Latest Ref Rng 10/25/2015 11/01/2015  HIV     Non Reactive Non Reactive   HCV Ab     0.0 - 0.9 s/co ratio 0.2   Hepatitis B Surface Ag     Negative Negative   Hep B S Ab      Non Reactive   Comment      Comment   LDH     125 - 245 U/L  441 (H)       RADIOGRAPHIC STUDIES: I have personally reviewed the radiological images as listed and agreed with the findings in the report. Ct Soft Tissue Neck W Contrast  10/24/2015  CLINICAL DATA:  Concerning for lymphoma. Abdominal pain, urinary retention. Imaging today demonstrates possible lymphoma. EXAM: CT NECK AND CHEST WITHOUT CONTRAST COMPARISON:  None. FINDINGS: CT NECK FINDINGS (limited assessment without contrast) Pharynx and larynx: Normal. Salivary glands: Normal. Thyroid: Probable lymphadenopathy versus thyromegaly, difficult to discern without contrast. Lymph nodes: Lymphadenopathy along the cervical changing, including 14 mm short access RIGHT level IIa lymph node, multiple  presumed lymph nodes level 3 and 4. Probable LEFT supraclavicular lymphadenopathy though, difficult to differentiate from vascular structures. Vascular: Limited assessment. Limited intracranial: Normal. Visualized orbits: Abnormal soft tissue within the retrobulbar fat at the optic nerve insertion, LEFT greater than RIGHT. The ocular globes are intact, status post bilateral ocular lens implants. Extraocular muscles are unremarkable. Preservation of orbital fat. Mastoids and visualized paranasal sinuses: Well aerated. Skeleton: No destructive bony lesions. Mild to moderate degenerative change of the cervical spine. CT CHEST FINDINGS Mediastinum/Lymph Nodes: Mediastinal lymphadenopathy, including LEFT peritracheal at least 22 mm nodal conglomeration. Bilateral presumed hilar lymphadenopathy. Internal mammary chain lymphadenopathy on the RIGHT measuring up to 14 mm. Bulky bilateral axillary lymphadenopathy measuring up to 28 mm short access. 19 mm short access cardio phrenic angle lymph node. Heart size normal. No pericardial effusions. Lungs/Pleura: Innumerable, diffuse subcentimeter centrilobular nodules and tree-in-bud infiltrates. Small bilateral pleural effusions with compressive atelectasis. Tracheobronchial tree is patent and midline. No pneumothorax. Musculoskeletal: No chest wall mass or suspicious bone lesions identified. IMPRESSION: CT NECK: Limited non contrast assessment. Extensive lymphadenopathy compatible with lymphoma, difficult to differentiate from underlying structures due to lack of contrast. Patent airway. Bilateral orbital retrobulbar masses most consistent with lymphoma. This would be better characterized on MRI of the orbits with contrast on a nonemergent basis. CT CHEST: Limited noncontrast assessment. Extensive lymphadenopathy consistent with lymphoma. Diffuse centrilobular nodules and tree-in-bud infiltrates most consistent with lymphangitic spread of lymphoma though, and infection could  have a similar appearance. Small bilateral pleural effusions are likely malignant. Electronically Signed   By: Elon Alas M.D.   On: 10/24/2015 22:17   Ct Chest W Contrast  10/24/2015  CLINICAL DATA:  Concerning for lymphoma. Abdominal pain, urinary retention. Imaging today demonstrates possible lymphoma. EXAM: CT NECK AND CHEST WITHOUT CONTRAST COMPARISON:  None. FINDINGS: CT NECK FINDINGS (limited assessment without contrast) Pharynx and larynx: Normal. Salivary glands: Normal. Thyroid: Probable lymphadenopathy versus thyromegaly, difficult to discern without contrast. Lymph nodes: Lymphadenopathy along the cervical changing, including 14 mm short access RIGHT level IIa lymph node, multiple presumed lymph nodes level 3 and 4. Probable LEFT supraclavicular lymphadenopathy though, difficult to differentiate from vascular structures. Vascular: Limited assessment. Limited intracranial: Normal. Visualized orbits: Abnormal soft tissue within the retrobulbar fat at the optic nerve insertion, LEFT greater than RIGHT.  The ocular globes are intact, status post bilateral ocular lens implants. Extraocular muscles are unremarkable. Preservation of orbital fat. Mastoids and visualized paranasal sinuses: Well aerated. Skeleton: No destructive bony lesions. Mild to moderate degenerative change of the cervical spine. CT CHEST FINDINGS Mediastinum/Lymph Nodes: Mediastinal lymphadenopathy, including LEFT peritracheal at least 22 mm nodal conglomeration. Bilateral presumed hilar lymphadenopathy. Internal mammary chain lymphadenopathy on the RIGHT measuring up to 14 mm. Bulky bilateral axillary lymphadenopathy measuring up to 28 mm short access. 19 mm short access cardio phrenic angle lymph node. Heart size normal. No pericardial effusions. Lungs/Pleura: Innumerable, diffuse subcentimeter centrilobular nodules and tree-in-bud infiltrates. Small bilateral pleural effusions with compressive atelectasis. Tracheobronchial tree  is patent and midline. No pneumothorax. Musculoskeletal: No chest wall mass or suspicious bone lesions identified. IMPRESSION: CT NECK: Limited non contrast assessment. Extensive lymphadenopathy compatible with lymphoma, difficult to differentiate from underlying structures due to lack of contrast. Patent airway. Bilateral orbital retrobulbar masses most consistent with lymphoma. This would be better characterized on MRI of the orbits with contrast on a nonemergent basis. CT CHEST: Limited noncontrast assessment. Extensive lymphadenopathy consistent with lymphoma. Diffuse centrilobular nodules and tree-in-bud infiltrates most consistent with lymphangitic spread of lymphoma though, and infection could have a similar appearance. Small bilateral pleural effusions are likely malignant. Electronically Signed   By: Elon Alas M.D.   On: 10/24/2015 22:17   Ct Abdomen Pelvis W Contrast  10/24/2015  CLINICAL DATA:  Upper abdominal pain and bloating for 2 months. EXAM: CT ABDOMEN AND PELVIS WITH CONTRAST TECHNIQUE: Multidetector CT imaging of the abdomen and pelvis was performed using the standard protocol following bolus administration of intravenous contrast. CONTRAST:  14mL OMNIPAQUE IOHEXOL 300 MG/ML  SOLN COMPARISON:  None. FINDINGS: Lower chest: Nodularity along the bronchovascular bundles in both lower lobes could be lymphoma in his involvement of the lungs. There are bilateral pleural effusions and overlying atelectasis. There is also a 17 mm epicardial lymph node on image 9. No pericardial effusion. Small pleural nodules are also noted. Retrocrural adenopathy is also noted. Hepatobiliary: Simple appearing hepatic cyst in the left lobe. No worrisome hepatic lesions or intrahepatic biliary dilatation. The gallbladder is unremarkable. No common bile duct dilatation. Pancreas: No mass, inflammation or ductal dilatation. Spleen: Normal size.  No focal lesions. Adrenals/Urinary Tract: The adrenal glands are  grossly normal. There is a thick rim soft tissue thickening along the posterior aspect of the left kidney which could be lymphomatous involvement. A left renal calculus is also noted. There is also a a thick rim of soft tissue density along the left renal pelvis extending down along the inferior margin of the kidney which is likely lymphoma. This measures a maximum of 3.5 cm on image number 41. Stomach/Bowel: The stomach is markedly thickened and somewhat nodular and irregular. Findings suspicious for gastric lymphoma. The duodenum, small bowel and colon are grossly normal. No obvious inflammatory changes or mass lesions. No obstructive findings. The terminal ileum is normal. The appendix is normal. Vascular/Lymphatic: Bulky mesenteric and retroperitoneal lymphadenopathy. Large upper abdominal mesenteric mass measures 12 x 7.5 cm on image number 22. There is also bulky retroperitoneal adenopathy with an index node between the aorta and left kidney measuring 30 x 24 mm on image number 32. There is also bilateral pelvic and inguinal lymphadenopathy. An index right external iliac lymph node on image number 69 measures a maximum of 21 mm short axis. Right inguinal lymph node measures 27 x 16 mm on image number 87. The aorta is normal  in caliber. The branch vessels are patent. The major venous structures are patent. Mild compression of the IVC by the bulky adenopathy. Other: The bladder is normal. The prostate gland is mildly enlarged with median lobe hypertrophy impressing on the base of the bladder. The seminal vesicles are normal. No free pelvic fluid collections. Bilateral inguinal hernias containing small bowel loops. Musculoskeletal: No obvious changes of osseous lymphoma. IMPRESSION: 1. Constellation of findings most consistent with lymphoma. 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 2. Suspect lymphomatous involvement of the lungs. There also bilateral pleural effusions and overlying atelectasis. No obvious involvement of the liver or spleen. Bilateral inguinal hernias containing small bowel loops. Electronically Signed   By: Marijo Sanes M.D.   On: 10/24/2015 19:15   US Biopsy  10/25/2015  CLINICAL DATA:  60 year old male with new diagnosis of profound adenopathy concerning for advanced lymphoma. He presents for ultrasound-guided core biopsy to obtain tissue diagnosis. EXAM: ULTRASOUND BIOPSY LYMPH NODE Date: 10/25/2015 PROCEDURE: 1. Ultrasound-guided core biopsy of right superficial inguinal lymph nodes Interventional Radiologist:  Criselda Peaches, MD ANESTHESIA/SEDATION: 50 mcg fentanyl administered intravenously. MEDICATIONS: None additional TECHNIQUE: Informed consent was obtained from the patient following explanation of the procedure, risks, benefits and alternatives. The patient understands, agrees and consents for the procedure. All questions were addressed. A time out was performed. The right groin was interrogated with ultrasound. Multiple enlarged superficial inguinal lymph nodes are identified. The nodes are superficial to the common femoral artery and vein. An appropriate skin entry site was selected and marked. The region was sterilely prepped and draped in standard fashion using chlorhexidine skin prep. Under real-time sonographic guidance, multiple 16 gauge core biopsies were then coaxially obtained. Needle placement was confirmed on all biopsy passes with real-time sonography. Biopsy specimens were placed in saline and delivered to pathology for further analysis. Post biopsy ultrasound imaging demonstrates no active bleeding or hematoma. The patient tolerated the procedure well. COMPLICATIONS: None. Estimated blood loss: 0 IMPRESSION: Technically successful ultrasound-guided core biopsy of superficial right inguinal adenopathy. Signed, Criselda Peaches, MD [facility] Electronically Signed   By: Jacqulynn Cadet M.D.   On: 10/25/2015 14:25    ECHO: 11/01/2015: Study Conclusions  - Left ventricle: The cavity size was normal. There was mild concentric hypertrophy. Systolic function was normal. The estimated ejection fraction was in the range of 50% to 55%. Wall motion was normal; there were no regional wall motion abnormalities. Doppler parameters are consistent with abnormal left ventricular relaxation (grade 1 diastolic dysfunction). - Aortic valve: Transvalvular velocity was within the normal range. There was no stenosis. There was no regurgitation. - Mitral valve: There was no regurgitation. - Right ventricle: The cavity size was normal. Wall thickness was normal. Systolic function was normal. - Tricuspid valve: There was no regurgitation. - Inferior vena cava: The vessel was normal in size. The respirophasic diameter changes were in the normal range (>= 50%), consistent with normal central venous pressure.Study Conclusions  - Left ventricle: The cavity size was normal. There was mild concentric hypertrophy. Systolic function was normal. The estimated ejection fraction was in the range of 50% to 55%. Wall motion was normal; there were no regional wall motion abnormalities. Doppler parameters are consistent with abnormal left ventricular relaxation (grade 1 diastolic dysfunction). - Aortic valve: Transvalvular velocity was within the normal range. There was no stenosis. There was no regurgitation. -  Mitral valve: There was no regurgitation. - Right ventricle: The cavity size was normal. Wall thickness was normal. Systolic function was normal. - Tricuspid valve: There was no regurgitation. - Inferior vena cava: The vessel was normal in size. The respirophasic diameter changes were in the normal range (>= 50%), consistent  with normal central venous pressure.    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 ECHO nl EF ECOG PS 1-2 but activities significantly limited due to being legally blind HIV/Hep C/Hep B neg Baseline LDH 441 Patient lives by himself and is using [employer] for transportation. Has some friend that help out and notes that his daughter shall be visiting but most of family out of state. 2) Retinitis pigmentosa causing legal blindness 3) Weight loss and protein calorie malnutrition. 4) multiple scab like skin lesions ? Due to pruritus and scratching vs skin involvement by lymphoma Plan -we discussed the diagnosis, natural history, prognosis, treatment options in significant details. -given patient preference, social circumstances and significantly limited mobility due to vision we will not pursue consideration for primary consolidation with Auto HSCT at this time. -PET/CT -port placement -R-CHOP x 6-8 cycles followed by Rituxan maintenance if CR achieved. (considered BR but shall leave this option for 2nd line setting, RV-CAP considered - higher risk of neutropenias and possibly neuropathy. -started on dexamethasone 4mg  po BID awaiting start of chemotherapy ASAP. -SW consultation -Home health services referral placed. -chemocounseling referral -allopurinol for TLS proprophylaxis  All of the patients questions were answered to his  apparent satisfaction. The patient knows to call the clinic with any problems, questions or concerns.  I spent 50 minutes counseling the patient face to face. The total time spent in the appointment was 60 minutes and more than 50% was on counseling and direct patient cares.    Sullivan Lone MD [place] AAHIVMS [facility] Hematology/Oncology Physician [facility]  (Office):       [phone] (Work cell):  [phone] (Fax):            [phone]

## 2015-11-04 ENCOUNTER — Ambulatory Visit: Payer: Medicare Other | Admitting: Nutrition

## 2015-11-04 ENCOUNTER — Encounter: Payer: Self-pay | Admitting: *Deleted

## 2015-11-04 ENCOUNTER — Other Ambulatory Visit: Payer: Medicare Other

## 2015-11-04 DIAGNOSIS — H548 Legal blindness, as defined in USA: Secondary | ICD-10-CM | POA: Insufficient documentation

## 2015-11-04 NOTE — Progress Notes (Signed)
Brief nutrition visit with patient who has been newly diagnosed with mantle cell lymphoma.  He is a 60 year old male in is a patient of Dr.Kale.  Past medical history includes retinitis pigmentosa causing legal blindness and shortness of breath.  Medications include Decadron, Ativan, Zofran, Compazine, and prednisone.  Height: 69 inches. Weight: 181.6 pounds. BMI: 26.1.  Patient has apparently lost 15-20 pounds. He has heard about oral nutrition supplements but he has never tried them. Patient was referred by social worker today for nutrition assistance.  Nutrition diagnosis: Inadequate oral intake related to 15-20 pound weight loss per patient as evidenced by patient tolerance of liquids only and stated weight loss.  Intervention: Brief discussion had with patient regarding importance of increasing calories and protein throughout the day. Recommended patient try to drink 3-6 oral nutrition supplements daily depending on ability to consume foods at mealtimes. Provided fact sheets on increasing calories and protein, samples of oral nutrition supplements, and coupons.  Patient stated appreciation.  Monitoring, evaluation, goals: Patient will tolerate full liquids, soft foods and oral nutrition supplements to minimize weight loss.  Next visit: Monday, February 20, during infusion.  **Disclaimer: This note was dictated with voice recognition software. Similar sounding words can inadvertently be transcribed and this note may contain transcription errors which may not have been corrected upon publication of note.**

## 2015-11-04 NOTE — Progress Notes (Signed)
[place] Work  Clinical Social Work was referred by patient for assessment of psychosocial needs due to new stage iv lymphoma diagnosis.  Clinical Social Worker had not received actual referral for pt to date other than need to submit [employer] application. Pt reports he was approved for [employer] and got ride here today with them. CSW provided [employer] pass today. Pt had many questions and concerns, largest being that he is legally blind and this is a huge barrier for him. Pt reports he is mostly homebound and has been for years due to his vision. He reports he has a friend that takes him shopping, but otherwise he has been barely managing. Pt concerned as he "has never been sick before" and knows his impaired vision complicates his care.  Pt very open to assistance and CSW discussed resources and options for assistance. Based on pt's income he will meet income criteria for [employer] and medicaid. Pt provided with [employer] application today and could benefit greatly from [facility] due to vision concerns. Pt reports family coming this weekend, daughter from [place] and son from [place]. He has not informed them of his diagnosis and wanted to do so in person. Pt alone today and needed assistance navigating his way to chemo class. Family will come with pt on Monday and will go to chemo class while pt in chemo. CSW phoned Dr Grier Mitts RN and requested chart be updated to state pt is legally blind to further address this barrier. This appears to be updated.   Pt open to additional supports and CSW referred to [place], dietician as well. CSW team to follow up in first chemo to meet family and further address needs.  CSW team to follow closely due to dx and complex needs.   Clinical Social Work interventions: Resource education and referral Supportive listening  Loren Racer, [place]  [place] Phone: [phone] Fax: [phone]

## 2015-11-07 ENCOUNTER — Encounter: Payer: Self-pay | Admitting: Hematology

## 2015-11-07 ENCOUNTER — Other Ambulatory Visit: Payer: Medicare Other

## 2015-11-07 ENCOUNTER — Encounter: Payer: Self-pay | Admitting: *Deleted

## 2015-11-07 ENCOUNTER — Ambulatory Visit: Payer: Medicare Other | Admitting: Hematology

## 2015-11-07 ENCOUNTER — Ambulatory Visit (HOSPITAL_BASED_OUTPATIENT_CLINIC_OR_DEPARTMENT_OTHER): Payer: Medicare Other | Admitting: Hematology

## 2015-11-07 ENCOUNTER — Other Ambulatory Visit: Payer: Self-pay | Admitting: *Deleted

## 2015-11-07 ENCOUNTER — Ambulatory Visit (HOSPITAL_BASED_OUTPATIENT_CLINIC_OR_DEPARTMENT_OTHER): Payer: Medicare Other

## 2015-11-07 ENCOUNTER — Other Ambulatory Visit (HOSPITAL_BASED_OUTPATIENT_CLINIC_OR_DEPARTMENT_OTHER): Payer: Medicare Other

## 2015-11-07 VITALS — BP 124/69 | HR 90 | Temp 97.6°F | Resp 18 | Ht 69.0 in | Wt 174.9 lb

## 2015-11-07 VITALS — BP 124/60 | HR 96 | Temp 98.1°F | Resp 18

## 2015-11-07 DIAGNOSIS — C8315 Mantle cell lymphoma, lymph nodes of inguinal region and lower limb: Secondary | ICD-10-CM

## 2015-11-07 DIAGNOSIS — L989 Disorder of the skin and subcutaneous tissue, unspecified: Secondary | ICD-10-CM

## 2015-11-07 DIAGNOSIS — C8318 Mantle cell lymphoma, lymph nodes of multiple sites: Secondary | ICD-10-CM

## 2015-11-07 DIAGNOSIS — Z720 Tobacco use: Secondary | ICD-10-CM

## 2015-11-07 DIAGNOSIS — Z5112 Encounter for antineoplastic immunotherapy: Secondary | ICD-10-CM | POA: Diagnosis present

## 2015-11-07 DIAGNOSIS — R634 Abnormal weight loss: Secondary | ICD-10-CM

## 2015-11-07 DIAGNOSIS — Z5111 Encounter for antineoplastic chemotherapy: Secondary | ICD-10-CM | POA: Diagnosis not present

## 2015-11-07 DIAGNOSIS — E46 Unspecified protein-calorie malnutrition: Secondary | ICD-10-CM

## 2015-11-07 DIAGNOSIS — H3552 Pigmentary retinal dystrophy: Secondary | ICD-10-CM

## 2015-11-07 LAB — CBC & DIFF AND RETIC
BASO%: 1 % (ref 0.0–2.0)
BASOS ABS: 0.8 10*3/uL — AB (ref 0.0–0.1)
EOS ABS: 0.2 10*3/uL (ref 0.0–0.5)
EOS%: 1.7 % (ref 0.0–7.0)
HEMATOCRIT: 40.1 % (ref 38.4–49.9)
HEMOGLOBIN: 12.8 g/dL — AB (ref 13.0–17.1)
IMMATURE RETIC FRACT: 5.5 % (ref 3.00–10.60)
LYMPH%: 18.1 % (ref 14.0–49.0)
MCH: 26.7 pg — AB (ref 27.2–33.4)
MCHC: 31.9 g/dL — ABNORMAL LOW (ref 32.0–36.0)
MCV: 83.7 fL (ref 79.3–98.0)
MONO#: 1.9 10*3/uL — AB (ref 0.1–0.9)
MONO%: 13.3 % (ref 0.0–14.0)
NEUT%: 65.9 % (ref 39.0–75.0)
NEUTROS ABS: 8.6 10*3/uL — AB (ref 1.5–6.5)
PLATELETS: 256 10*3/uL (ref 140–400)
RBC: 4.79 10*6/uL (ref 4.20–5.82)
RDW: 15.5 % — ABNORMAL HIGH (ref 11.0–14.6)
Retic %: 1.53 % (ref 0.80–1.80)
Retic Ct Abs: 73.29 10*3/uL (ref 34.80–93.90)
WBC: 14.1 10*3/uL — AB (ref 4.0–10.3)
lymph#: 2.6 10*3/uL (ref 0.9–3.3)

## 2015-11-07 LAB — COMPREHENSIVE METABOLIC PANEL
ALBUMIN: 3.1 g/dL — AB (ref 3.5–5.0)
ALK PHOS: 83 U/L (ref 40–150)
ALT: 17 U/L (ref 0–55)
AST: 16 U/L (ref 5–34)
Anion Gap: 11 mEq/L (ref 3–11)
BUN: 19.5 mg/dL (ref 7.0–26.0)
CALCIUM: 9.7 mg/dL (ref 8.4–10.4)
CO2: 26 mEq/L (ref 22–29)
Chloride: 102 mEq/L (ref 98–109)
Creatinine: 1 mg/dL (ref 0.7–1.3)
GLUCOSE: 109 mg/dL (ref 70–140)
POTASSIUM: 4.1 meq/L (ref 3.5–5.1)
SODIUM: 139 meq/L (ref 136–145)
TOTAL PROTEIN: 7.1 g/dL (ref 6.4–8.3)

## 2015-11-07 LAB — LACTATE DEHYDROGENASE: LDH: 450 U/L — ABNORMAL HIGH (ref 125–245)

## 2015-11-07 LAB — URIC ACID: URIC ACID, SERUM: 7.1 mg/dL (ref 2.6–7.4)

## 2015-11-07 MED ORDER — ACETAMINOPHEN 325 MG PO TABS
ORAL_TABLET | ORAL | Status: AC
  Filled 2015-11-07: qty 2

## 2015-11-07 MED ORDER — SODIUM CHLORIDE 0.9 % IV SOLN
Freq: Once | INTRAVENOUS | Status: AC
  Administered 2015-11-07: 10:00:00 via INTRAVENOUS

## 2015-11-07 MED ORDER — SODIUM CHLORIDE 0.9 % IV SOLN
375.0000 mg/m2 | Freq: Once | INTRAVENOUS | Status: AC
  Administered 2015-11-07: 800 mg via INTRAVENOUS
  Filled 2015-11-07: qty 80

## 2015-11-07 MED ORDER — ACETAMINOPHEN 325 MG PO TABS
650.0000 mg | ORAL_TABLET | Freq: Once | ORAL | Status: AC
Start: 2015-11-07 — End: 2015-11-07
  Administered 2015-11-07: 650 mg via ORAL

## 2015-11-07 MED ORDER — DIPHENHYDRAMINE HCL 25 MG PO CAPS
ORAL_CAPSULE | ORAL | Status: AC
  Filled 2015-11-07: qty 2

## 2015-11-07 MED ORDER — DEXAMETHASONE SODIUM PHOSPHATE 100 MG/10ML IJ SOLN
10.0000 mg | Freq: Once | INTRAMUSCULAR | Status: AC
  Administered 2015-11-07: 10 mg via INTRAVENOUS
  Filled 2015-11-07: qty 1

## 2015-11-07 MED ORDER — DOXORUBICIN HCL CHEMO IV INJECTION 2 MG/ML
50.0000 mg/m2 | Freq: Once | INTRAVENOUS | Status: AC
  Administered 2015-11-07: 100 mg via INTRAVENOUS
  Filled 2015-11-07: qty 50

## 2015-11-07 MED ORDER — ALLOPURINOL 300 MG PO TABS
300.0000 mg | ORAL_TABLET | Freq: Once | ORAL | Status: AC
  Administered 2015-11-07: 300 mg via ORAL
  Filled 2015-11-07: qty 1

## 2015-11-07 MED ORDER — SODIUM CHLORIDE 0.9 % IV SOLN
750.0000 mg/m2 | Freq: Once | INTRAVENOUS | Status: AC
  Administered 2015-11-07: 1500 mg via INTRAVENOUS
  Filled 2015-11-07: qty 75

## 2015-11-07 MED ORDER — ONDANSETRON HCL 8 MG PO TABS: 8.0000 mg | ORAL_TABLET | Freq: Two times a day (BID) | ORAL | Status: DC | PRN

## 2015-11-07 MED ORDER — SODIUM CHLORIDE 0.9 % IV SOLN
INTRAVENOUS | Status: AC
  Administered 2015-11-07: 11:00:00 via INTRAVENOUS

## 2015-11-07 MED ORDER — PALONOSETRON HCL INJECTION 0.25 MG/5ML
0.2500 mg | Freq: Once | INTRAVENOUS | Status: AC
  Administered 2015-11-07: 0.25 mg via INTRAVENOUS

## 2015-11-07 MED ORDER — PALONOSETRON HCL INJECTION 0.25 MG/5ML
INTRAVENOUS | Status: AC
  Filled 2015-11-07: qty 5

## 2015-11-07 MED ORDER — LORAZEPAM 0.5 MG PO TABS: 0.5000 mg | ORAL_TABLET | Freq: Four times a day (QID) | ORAL | Status: DC | PRN

## 2015-11-07 MED ORDER — SODIUM CHLORIDE 0.9 % IV SOLN
2.0000 mg | Freq: Once | INTRAVENOUS | Status: AC
  Administered 2015-11-07: 2 mg via INTRAVENOUS
  Filled 2015-11-07: qty 2

## 2015-11-07 MED ORDER — PREDNISONE 20 MG PO TABS: 40.0000 mg/m2 | ORAL_TABLET | Freq: Every day | ORAL | Status: DC

## 2015-11-07 MED ORDER — ALLOPURINOL 300 MG PO TABS: 300.0000 mg | ORAL_TABLET | Freq: Every day | ORAL | Status: DC

## 2015-11-07 MED ORDER — PROCHLORPERAZINE MALEATE 10 MG PO TABS: 10.0000 mg | ORAL_TABLET | Freq: Four times a day (QID) | ORAL | Status: DC | PRN

## 2015-11-07 MED ORDER — DIPHENHYDRAMINE HCL 25 MG PO CAPS
50.0000 mg | ORAL_CAPSULE | Freq: Once | ORAL | Status: AC
  Administered 2015-11-07: 50 mg via ORAL

## 2015-11-07 MED FILL — PROCHLORPERAZINE 10 MG TAB: 10 | 7 days supply | Qty: 30 | Fill #0

## 2015-11-07 MED FILL — ONDANSETRON HCL 8 MG TABLET: 8 | 15 days supply | Qty: 30 | Fill #0

## 2015-11-07 MED FILL — LORazepam 0.5 MG TABS: 0.5 | 7 days supply | Qty: 30 | Fill #0

## 2015-11-07 MED FILL — ALLOPURINOL 300 MG TABLET: 300 | 30 days supply | Qty: 30 | Fill #0

## 2015-11-07 MED FILL — predniSONE 20 MG TABS: 20 | 15 days supply | Qty: 60 | Fill #0

## 2015-11-07 NOTE — Progress Notes (Signed)
[place] Work  Holiday representative met with patient in the infusion room to follow up after meeting with CSW last week.  Patient informed CSW that his daughter would be relocating this week to be his primary caregiver during treatment. CSW also met with patients daughter to offer support.  Patients daughter plans to assist patient in completing his Medicaid application this week and has contact information for CSW team if she has any additional questions or concerns.  Johnnye Lana, MSW, LCSW, OSW-C Clinical Social Worker [facility] [phone]

## 2015-11-07 NOTE — Patient Instructions (Signed)
[facility] Discharge Instructions for Patients Receiving Chemotherapy  Today you received the following chemotherapy agents:  Adriamycin, Vincristine, Cytoxan and Rituxan  To help prevent nausea and vomiting after your treatment, we encourage you to take your nausea medication as ordered per MD.   If you develop nausea and vomiting that is not controlled by your nausea medication, call the clinic.   BELOW ARE SYMPTOMS THAT SHOULD BE REPORTED IMMEDIATELY:  *FEVER GREATER THAN 100.5 F  *CHILLS WITH OR WITHOUT FEVER  NAUSEA AND VOMITING THAT IS NOT CONTROLLED WITH YOUR NAUSEA MEDICATION  *UNUSUAL SHORTNESS OF BREATH  *UNUSUAL BRUISING OR BLEEDING  TENDERNESS IN MOUTH AND THROAT WITH OR WITHOUT PRESENCE OF ULCERS  *URINARY PROBLEMS  *BOWEL PROBLEMS  UNUSUAL RASH Items with * indicate a potential emergency and should be followed up as soon as possible.  Feel free to call the clinic you have any questions or concerns. The clinic phone number is [phone].  Please show the [facility] at check-in to the Emergency Department and triage nurse.  Doxorubicin injection What is this medicine? DOXORUBICIN (dox oh ROO bi sin) is a chemotherapy drug. It is used to treat many kinds of cancer like Hodgkin's disease, leukemia, non-Hodgkin's lymphoma, neuroblastoma, sarcoma, and Wilms' tumor. It is also used to treat bladder cancer, breast cancer, lung cancer, ovarian cancer, stomach cancer, and thyroid cancer. This medicine may be used for other purposes; ask your health care provider or pharmacist if you have questions. What should I tell my health care provider before I take this medicine? They need to know if you have any of these conditions: -blood disorders -heart disease, recent heart attack -infection (especially a virus infection such as chickenpox, cold sores, or herpes) -irregular heartbeat -liver disease -recent or ongoing radiation therapy -an unusual  or allergic reaction to doxorubicin, other chemotherapy agents, other medicines, foods, dyes, or preservatives -pregnant or trying to get pregnant -breast-feeding How should I use this medicine? This drug is given as an infusion into a vein. It is administered in a hospital or clinic by a specially trained health care professional. If you have pain, swelling, burning or any unusual feeling around the site of your injection, tell your health care professional right away. Talk to your pediatrician regarding the use of this medicine in children. Special care may be needed. Overdosage: If you think you have taken too much of this medicine contact a poison control center or emergency room at once. NOTE: This medicine is only for you. Do not share this medicine with others. What if I miss a dose? It is important not to miss your dose. Call your doctor or health care professional if you are unable to keep an appointment. What may interact with this medicine? Do not take this medicine with any of the following medications: -cisapride -droperidol -halofantrine -pimozide -zidovudine This medicine may also interact with the following medications: -chloroquine -chlorpromazine -clarithromycin -cyclophosphamide -cyclosporine -erythromycin -medicines for depression, anxiety, or psychotic disturbances -medicines for irregular heart beat like amiodarone, bepridil, dofetilide, encainide, flecainide, propafenone, quinidine -medicines for seizures like ethotoin, fosphenytoin, phenytoin -medicines for nausea, vomiting like dolasetron, ondansetron, palonosetron -medicines to increase blood counts like filgrastim, pegfilgrastim, sargramostim -methadone -methotrexate -pentamidine -progesterone -vaccines -verapamil Talk to your doctor or health care professional before taking any of these medicines: -acetaminophen -aspirin -ibuprofen -ketoprofen -naproxen This list may not describe all possible  interactions. Give your health care provider a list of all the medicines, herbs, non-prescription drugs, or dietary  supplements you use. Also tell them if you smoke, drink alcohol, or use illegal drugs. Some items may interact with your medicine. What should I watch for while using this medicine? Your condition will be monitored carefully while you are receiving this medicine. You will need important blood work done while you are taking this medicine. This drug may make you feel generally unwell. This is not uncommon, as chemotherapy can affect healthy cells as well as cancer cells. Report any side effects. Continue your course of treatment even though you feel ill unless your doctor tells you to stop. Your urine may turn red for a few days after your dose. This is not blood. If your urine is dark or brown, call your doctor. In some cases, you may be given additional medicines to help with side effects. Follow all directions for their use. Call your doctor or health care professional for advice if you get a fever, chills or sore throat, or other symptoms of a cold or flu. Do not treat yourself. This drug decreases your body's ability to fight infections. Try to avoid being around people who are sick. This medicine may increase your risk to bruise or bleed. Call your doctor or health care professional if you notice any unusual bleeding. Be careful brushing and flossing your teeth or using a toothpick because you may get an infection or bleed more easily. If you have any dental work done, tell your dentist you are receiving this medicine. Avoid taking products that contain aspirin, acetaminophen, ibuprofen, naproxen, or ketoprofen unless instructed by your doctor. These medicines may hide a fever. Men and women of childbearing age should use effective birth control methods while using taking this medicine. Do not become pregnant while taking this medicine. There is a potential for serious side effects to an  unborn child. Talk to your health care professional or pharmacist for more information. Do not breast-feed an infant while taking this medicine. Do not let others touch your urine or other body fluids for 5 days after each treatment with this medicine. Caregivers should wear latex gloves to avoid touching body fluids during this time. There is a maximum amount of this medicine you should receive throughout your life. The amount depends on the medical condition being treated and your overall health. Your doctor will watch how much of this medicine you receive in your lifetime. Tell your doctor if you have taken this medicine before. What side effects may I notice from receiving this medicine? Side effects that you should report to your doctor or health care professional as soon as possible: -allergic reactions like skin rash, itching or hives, swelling of the face, lips, or tongue -low blood counts - this medicine may decrease the number of white blood cells, red blood cells and platelets. You may be at increased risk for infections and bleeding. -signs of infection - fever or chills, cough, sore throat, pain or difficulty passing urine -signs of decreased platelets or bleeding - bruising, pinpoint red spots on the skin, black, tarry stools, blood in the urine -signs of decreased red blood cells - unusually weak or tired, fainting spells, lightheadedness -breathing problems -chest pain -fast, irregular heartbeat -mouth sores -nausea, vomiting -pain, swelling, redness at site where injected -pain, tingling, numbness in the hands or feet -swelling of ankles, feet, or hands -unusual bleeding or bruising Side effects that usually do not require medical attention (report to your doctor or health care professional if they continue or are bothersome): -diarrhea -facial flushing -  hair loss -loss of appetite -missed menstrual periods -nail discoloration or damage -red or watery eyes -red colored  urine -stomach upset This list may not describe all possible side effects. Call your doctor for medical advice about side effects. You may report side effects to FDA at 1-800-FDA-1088. Where should I keep my medicine? This drug is given in a hospital or clinic and will not be stored at home. NOTE: This sheet is a summary. It may not cover all possible information. If you have questions about this medicine, talk to your doctor, pharmacist, or health care provider.    2016, [employer] Standard. (2013-01-20 09:54:34) Vincristine injection What is this medicine? VINCRISTINE (vin KRIS teen) is a chemotherapy drug. It slows the growth of cancer cells. This medicine is used to treat many types of cancer like Hodgkin's disease, leukemia, non-Hodgkin's lymphoma, neuroblastoma (brain cancer), rhabdomyosarcoma, and Wilms' tumor. This medicine may be used for other purposes; ask your health care provider or pharmacist if you have questions. What should I tell my health care provider before I take this medicine? They need to know if you have any of these conditions: -blood disorders -gout -infection (especially chickenpox, cold sores, or herpes) -kidney disease -liver disease -lung disease -nervous system disease like Charcot-Marie-Tooth (CMT) -recent or ongoing radiation therapy -an unusual or allergic reaction to vincristine, other chemotherapy agents, other medicines, foods, dyes, or preservatives -pregnant or trying to get pregnant -breast-feeding How should I use this medicine? This drug is given as an infusion into a vein. It is administered in a hospital or clinic by a specially trained health care professional. If you have pain, swelling, burning, or any unusual feeling around the site of your injection, tell your health care professional right away. Talk to your pediatrician regarding the use of this medicine in children. While this drug may be prescribed for selected conditions,  precautions do apply. Overdosage: If you think you have taken too much of this medicine contact a poison control center or emergency room at once. NOTE: This medicine is only for you. Do not share this medicine with others. What if I miss a dose? It is important not to miss your dose. Call your doctor or health care professional if you are unable to keep an appointment. What may interact with this medicine? Do not take this medicine with any of the following medications: -itraconazole -mibefradil -voriconazole This medicine may also interact with the following medications: -cyclosporine -erythromycin -fluconazole -ketoconazole -medicines for HIV like delavirdine, efavirenz, nevirapine -medicines for seizures like ethotoin, fosphenotoin, phenytoin -medicines to increase blood counts like filgrastim, pegfilgrastim, sargramostim -other chemotherapy drugs like cisplatin, L-asparaginase, methotrexate, mitomycin, paclitaxel -pegaspargase -vaccines -zalcitabine, ddC Talk to your doctor or health care professional before taking any of these medicines: -acetaminophen -aspirin -ibuprofen -ketoprofen -naproxen This list may not describe all possible interactions. Give your health care provider a list of all the medicines, herbs, non-prescription drugs, or dietary supplements you use. Also tell them if you smoke, drink alcohol, or use illegal drugs. Some items may interact with your medicine. What should I watch for while using this medicine? Your condition will be monitored carefully while you are receiving this medicine. You will need important blood work done while you are taking this medicine. This drug may make you feel generally unwell. This is not uncommon, as chemotherapy can affect healthy cells as well as cancer cells. Report any side effects. Continue your course of treatment even though you feel ill unless your doctor tells you to  stop. In some cases, you may be given additional  medicines to help with side effects. Follow all directions for their use. Call your doctor or health care professional for advice if you get a fever, chills or sore throat, or other symptoms of a cold or flu. Do not treat yourself. Avoid taking products that contain aspirin, acetaminophen, ibuprofen, naproxen, or ketoprofen unless instructed by your doctor. These medicines may hide a fever. Do not become pregnant while taking this medicine. Women should inform their doctor if they wish to become pregnant or think they might be pregnant. There is a potential for serious side effects to an unborn child. Talk to your health care professional or pharmacist for more information. Do not breast-feed an infant while taking this medicine. Men may have a lower sperm count while taking this medicine. Talk to your doctor if you plan to father a child. What side effects may I notice from receiving this medicine? Side effects that you should report to your doctor or health care professional as soon as possible: -allergic reactions like skin rash, itching or hives, swelling of the face, lips, or tongue -breathing problems -confusion or changes in emotions or moods -constipation -cough -mouth sores -muscle weakness -nausea and vomiting -pain, swelling, redness or irritation at the injection site -pain, tingling, numbness in the hands or feet -problems with balance, talking, walking -seizures -stomach pain -trouble passing urine or change in the amount of urine Side effects that usually do not require medical attention (report to your doctor or health care professional if they continue or are bothersome): -diarrhea -hair loss -jaw pain -loss of appetite This list may not describe all possible side effects. Call your doctor for medical advice about side effects. You may report side effects to FDA at 1-800-FDA-1088. Where should I keep my medicine? This drug is given in a hospital or clinic and will not be  stored at home. NOTE: This sheet is a summary. It may not cover all possible information. If you have questions about this medicine, talk to your doctor, pharmacist, or health care provider.    2016, [employer] Standard. (2008-06-21 17:17:13) Cyclophosphamide injection What is this medicine? CYCLOPHOSPHAMIDE (sye kloe FOSS fa mide) is a chemotherapy drug. It slows the growth of cancer cells. This medicine is used to treat many types of cancer like lymphoma, myeloma, leukemia, breast cancer, and ovarian cancer, to name a few. This medicine may be used for other purposes; ask your health care provider or pharmacist if you have questions. What should I tell my health care provider before I take this medicine? They need to know if you have any of these conditions: -blood disorders -history of other chemotherapy -infection -kidney disease -liver disease -recent or ongoing radiation therapy -tumors in the bone marrow -an unusual or allergic reaction to cyclophosphamide, other chemotherapy, other medicines, foods, dyes, or preservatives -pregnant or trying to get pregnant -breast-feeding How should I use this medicine? This drug is usually given as an injection into a vein or muscle or by infusion into a vein. It is administered in a hospital or clinic by a specially trained health care professional. Talk to your pediatrician regarding the use of this medicine in children. Special care may be needed. Overdosage: If you think you have taken too much of this medicine contact a poison control center or emergency room at once. NOTE: This medicine is only for you. Do not share this medicine with others. What if I miss a dose? It is important  not to miss your dose. Call your doctor or health care professional if you are unable to keep an appointment. What may interact with this medicine? This medicine may interact with the following medications: -amiodarone -amphotericin  B -azathioprine -certain antiviral medicines for HIV or AIDS such as protease inhibitors (e.g., indinavir, ritonavir) and zidovudine -certain blood pressure medications such as benazepril, captopril, enalapril, fosinopril, lisinopril, moexipril, monopril, perindopril, quinapril, ramipril, trandolapril -certain cancer medications such as anthracyclines (e.g., daunorubicin, doxorubicin), busulfan, cytarabine, paclitaxel, pentostatin, tamoxifen, trastuzumab -certain diuretics such as chlorothiazide, chlorthalidone, hydrochlorothiazide, indapamide, metolazone -certain medicines that treat or prevent blood clots like warfarin -certain muscle relaxants such as succinylcholine -cyclosporine -etanercept -indomethacin -medicines to increase blood counts like filgrastim, pegfilgrastim, sargramostim -medicines used as general anesthesia -metronidazole -natalizumab This list may not describe all possible interactions. Give your health care provider a list of all the medicines, herbs, non-prescription drugs, or dietary supplements you use. Also tell them if you smoke, drink alcohol, or use illegal drugs. Some items may interact with your medicine. What should I watch for while using this medicine? Visit your doctor for checks on your progress. This drug may make you feel generally unwell. This is not uncommon, as chemotherapy can affect healthy cells as well as cancer cells. Report any side effects. Continue your course of treatment even though you feel ill unless your doctor tells you to stop. Drink water or other fluids as directed. Urinate often, even at night. In some cases, you may be given additional medicines to help with side effects. Follow all directions for their use. Call your doctor or health care professional for advice if you get a fever, chills or sore throat, or other symptoms of a cold or flu. Do not treat yourself. This drug decreases your body's ability to fight infections. Try to avoid  being around people who are sick. This medicine may increase your risk to bruise or bleed. Call your doctor or health care professional if you notice any unusual bleeding. Be careful brushing and flossing your teeth or using a toothpick because you may get an infection or bleed more easily. If you have any dental work done, tell your dentist you are receiving this medicine. You may get drowsy or dizzy. Do not drive, use machinery, or do anything that needs mental alertness until you know how this medicine affects you. Do not become pregnant while taking this medicine or for 1 year after stopping it. Women should inform their doctor if they wish to become pregnant or think they might be pregnant. Men should not father a child while taking this medicine and for 4 months after stopping it. There is a potential for serious side effects to an unborn child. Talk to your health care professional or pharmacist for more information. Do not breast-feed an infant while taking this medicine. This medicine may interfere with the ability to have a child. This medicine has caused ovarian failure in some women. This medicine has caused reduced sperm counts in some men. You should talk with your doctor or health care professional if you are concerned about your fertility. If you are going to have surgery, tell your doctor or health care professional that you have taken this medicine. What side effects may I notice from receiving this medicine? Side effects that you should report to your doctor or health care professional as soon as possible: -allergic reactions like skin rash, itching or hives, swelling of the face, lips, or tongue -low blood counts - this medicine  may decrease the number of white blood cells, red blood cells and platelets. You may be at increased risk for infections and bleeding. -signs of infection - fever or chills, cough, sore throat, pain or difficulty passing urine -signs of decreased platelets or  bleeding - bruising, pinpoint red spots on the skin, black, tarry stools, blood in the urine -signs of decreased red blood cells - unusually weak or tired, fainting spells, lightheadedness -breathing problems -dark urine -dizziness -palpitations -swelling of the ankles, feet, hands -trouble passing urine or change in the amount of urine -weight gain -yellowing of the eyes or skin Side effects that usually do not require medical attention (report to your doctor or health care professional if they continue or are bothersome): -changes in nail or skin color -hair loss -missed menstrual periods -mouth sores -nausea, vomiting This list may not describe all possible side effects. Call your doctor for medical advice about side effects. You may report side effects to FDA at 1-800-FDA-1088. Where should I keep my medicine? This drug is given in a hospital or clinic and will not be stored at home. NOTE: This sheet is a summary. It may not cover all possible information. If you have questions about this medicine, talk to your doctor, pharmacist, or health care provider.    2016, [employer] Standard. (2012-08-08 16:22:58) Rituximab injection What is this medicine? RITUXIMAB (ri TUX i mab) is a monoclonal antibody. It is used commonly to treat non-Hodgkin lymphoma and other conditions. It is also used to treat rheumatoid arthritis (RA). In RA, this medicine slows the inflammatory process and help reduce joint pain and swelling. This medicine is often used with other cancer or arthritis medications. This medicine may be used for other purposes; ask your health care provider or pharmacist if you have questions. What should I tell my health care provider before I take this medicine? They need to know if you have any of these conditions: -blood disorders -heart disease -history of hepatitis B -infection (especially a virus infection such as chickenpox, cold sores, or herpes) -irregular  heartbeat -kidney disease -lung or breathing disease, like asthma -lupus -an unusual or allergic reaction to rituximab, mouse proteins, other medicines, foods, dyes, or preservatives -pregnant or trying to get pregnant -breast-feeding How should I use this medicine? This medicine is for infusion into a vein. It is administered in a hospital or clinic by a specially trained health care professional. A special MedGuide will be given to you by the pharmacist with each prescription and refill. Be sure to read this information carefully each time. Talk to your pediatrician regarding the use of this medicine in children. This medicine is not approved for use in children. Overdosage: If you think you have taken too much of this medicine contact a poison control center or emergency room at once. NOTE: This medicine is only for you. Do not share this medicine with others. What if I miss a dose? It is important not to miss a dose. Call your doctor or health care professional if you are unable to keep an appointment. What may interact with this medicine? -cisplatin -medicines for blood pressure -some other medicines for arthritis -vaccines This list may not describe all possible interactions. Give your health care provider a list of all the medicines, herbs, non-prescription drugs, or dietary supplements you use. Also tell them if you smoke, drink alcohol, or use illegal drugs. Some items may interact with your medicine. What should I watch for while using this medicine? Report any  side effects that you notice during your treatment right away, such as changes in your breathing, fever, chills, dizziness or lightheadedness. These effects are more common with the first dose. Visit your prescriber or health care professional for checks on your progress. You will need to have regular blood work. Report any other side effects. The side effects of this medicine can continue after you finish your treatment.  Continue your course of treatment even though you feel ill unless your doctor tells you to stop. Call your doctor or health care professional for advice if you get a fever, chills or sore throat, or other symptoms of a cold or flu. Do not treat yourself. This drug decreases your body's ability to fight infections. Try to avoid being around people who are sick. This medicine may increase your risk to bruise or bleed. Call your doctor or health care professional if you notice any unusual bleeding. Be careful brushing and flossing your teeth or using a toothpick because you may get an infection or bleed more easily. If you have any dental work done, tell your dentist you are receiving this medicine. Avoid taking products that contain aspirin, acetaminophen, ibuprofen, naproxen, or ketoprofen unless instructed by your doctor. These medicines may hide a fever. Do not become pregnant while taking this medicine. Women should inform their doctor if they wish to become pregnant or think they might be pregnant. There is a potential for serious side effects to an unborn child. Talk to your health care professional or pharmacist for more information. Do not breast-feed an infant while taking this medicine. What side effects may I notice from receiving this medicine? Side effects that you should report to your doctor or health care professional as soon as possible: -allergic reactions like skin rash, itching or hives, swelling of the face, lips, or tongue -low blood counts - this medicine may decrease the number of white blood cells, red blood cells and platelets. You may be at increased risk for infections and bleeding. -signs of infection - fever or chills, cough, sore throat, pain or difficulty passing urine -signs of decreased platelets or bleeding - bruising, pinpoint red spots on the skin, black, tarry stools, blood in the urine -signs of decreased red blood cells - unusually weak or tired, fainting spells,  lightheadedness -breathing problems -confused, not responsive -chest pain -fast, irregular heartbeat -feeling faint or lightheaded, falls -mouth sores -redness, blistering, peeling or loosening of the skin, including inside the mouth -stomach pain -swelling of the ankles, feet, or hands -trouble passing urine or change in the amount of urine Side effects that usually do not require medical attention (report to your doctor or other health care professional if they continue or are bothersome): -anxiety -headache -loss of appetite -muscle aches -nausea -night sweats This list may not describe all possible side effects. Call your doctor for medical advice about side effects. You may report side effects to FDA at 1-800-FDA-1088. Where should I keep my medicine? This drug is given in a hospital or clinic and will not be stored at home. NOTE: This sheet is a summary. It may not cover all possible information. If you have questions about this medicine, talk to your doctor, pharmacist, or health care provider.    2016, Elsevier/Gold Standard. (2014-12-01 22:30:56)

## 2015-11-07 NOTE — Progress Notes (Signed)
Pt is approved for the $400 [facility] grant.  °

## 2015-11-09 ENCOUNTER — Telehealth: Payer: Self-pay | Admitting: Hematology

## 2015-11-09 ENCOUNTER — Other Ambulatory Visit: Payer: Self-pay | Admitting: General Surgery

## 2015-11-09 NOTE — Telephone Encounter (Signed)
Spoke with patient and he is aware of his 2/7 appointment

## 2015-11-10 ENCOUNTER — Ambulatory Visit (HOSPITAL_COMMUNITY)
Admission: RE | Admit: 2015-11-10 | Discharge: 2015-11-10 | Disposition: A | Discharge status: Home / Self Care | Payer: Medicare Other | Source: Ambulatory Visit | Attending: Hematology | Admitting: Hematology

## 2015-11-10 ENCOUNTER — Other Ambulatory Visit: Payer: Self-pay | Admitting: Hematology

## 2015-11-10 ENCOUNTER — Encounter (HOSPITAL_COMMUNITY): Payer: Self-pay

## 2015-11-10 DIAGNOSIS — Z5111 Encounter for antineoplastic chemotherapy: Secondary | ICD-10-CM | POA: Diagnosis not present

## 2015-11-10 DIAGNOSIS — C8318 Mantle cell lymphoma, lymph nodes of multiple sites: Secondary | ICD-10-CM

## 2015-11-10 DIAGNOSIS — C8315 Mantle cell lymphoma, lymph nodes of inguinal region and lower limb: Secondary | ICD-10-CM | POA: Diagnosis not present

## 2015-11-10 DIAGNOSIS — C8310 Mantle cell lymphoma, unspecified site: Secondary | ICD-10-CM | POA: Diagnosis not present

## 2015-11-10 LAB — CBC
HEMATOCRIT: 38.1 % — AB (ref 39.0–52.0)
HEMOGLOBIN: 12.5 g/dL — AB (ref 13.0–17.0)
MCH: 26.7 pg (ref 26.0–34.0)
MCHC: 32.8 g/dL (ref 30.0–36.0)
MCV: 81.4 fL (ref 78.0–100.0)
Platelets: 257 10*3/uL (ref 150–400)
RBC: 4.68 MIL/uL (ref 4.22–5.81)
RDW: 15.3 % (ref 11.5–15.5)
WBC: 10.8 10*3/uL — ABNORMAL HIGH (ref 4.0–10.5)

## 2015-11-10 LAB — PROTIME-INR
INR: 0.98 (ref 0.00–1.49)
Prothrombin Time: 13.2 seconds (ref 11.6–15.2)

## 2015-11-10 MED ORDER — FENTANYL CITRATE (PF) 100 MCG/2ML IJ SOLN
INTRAMUSCULAR | Status: AC | PRN
  Administered 2015-11-10: 25 ug via INTRAVENOUS
  Administered 2015-11-10: 50 ug via INTRAVENOUS

## 2015-11-10 MED ORDER — LIDOCAINE HCL 1 % IJ SOLN
INTRAMUSCULAR | Status: AC
  Filled 2015-11-10: qty 20

## 2015-11-10 MED ORDER — MIDAZOLAM HCL 2 MG/2ML IJ SOLN
INTRAMUSCULAR | Status: AC
  Filled 2015-11-10: qty 4

## 2015-11-10 MED ORDER — HEPARIN SOD (PORK) LOCK FLUSH 100 UNIT/ML IV SOLN
INTRAVENOUS | Status: AC
Start: 2015-11-10 — End: 2015-11-11
  Filled 2015-11-10: qty 5

## 2015-11-10 MED ORDER — HEPARIN SOD (PORK) LOCK FLUSH 100 UNIT/ML IV SOLN
INTRAVENOUS | Status: AC | PRN
  Administered 2015-11-10: 500 [IU]

## 2015-11-10 MED ORDER — SODIUM CHLORIDE 0.9 % IV SOLN
INTRAVENOUS | Status: DC
  Administered 2015-11-10: 10:00:00 via INTRAVENOUS

## 2015-11-10 MED ORDER — FENTANYL CITRATE (PF) 100 MCG/2ML IJ SOLN
INTRAMUSCULAR | Status: AC
  Filled 2015-11-10: qty 2

## 2015-11-10 MED ORDER — CEFAZOLIN SODIUM-DEXTROSE 2-3 GM-% IV SOLR
2.0000 g | INTRAVENOUS | Status: AC
  Administered 2015-11-10: 2 g via INTRAVENOUS

## 2015-11-10 MED ORDER — MIDAZOLAM HCL 2 MG/2ML IJ SOLN
INTRAMUSCULAR | Status: AC | PRN
  Administered 2015-11-10: 1 mg via INTRAVENOUS
  Administered 2015-11-10 (×2): 0.5 mg via INTRAVENOUS

## 2015-11-10 MED ORDER — LIDOCAINE-EPINEPHRINE 2 %-1:100000 IJ SOLN
INTRAMUSCULAR | Status: AC
Start: 2015-11-10 — End: 2015-11-11
  Filled 2015-11-10: qty 1

## 2015-11-10 MED ORDER — CEFAZOLIN SODIUM-DEXTROSE 2-3 GM-% IV SOLR
INTRAVENOUS | Status: AC
  Filled 2015-11-10: qty 50

## 2015-11-10 NOTE — Sedation Documentation (Signed)
Patient denies pain and is resting comfortably.  

## 2015-11-10 NOTE — Procedures (Signed)
Interventional Radiology Procedure Note  Procedure: Placement of a right IJ approach single lumen PowerPort.  Tip is positioned at the superior cavoatrial junction and catheter is ready for immediate use.  Complications: No immediate Recommendations:  - Ok to shower tomorrow - Do not submerge for 7 days - Routine line care   Signed,  Durelle Zepeda K. Shareena Nusz, MD   

## 2015-11-10 NOTE — Discharge Instructions (Signed)
Moderate Conscious Sedation, Adult °Sedation is the use of medicines to promote relaxation and relieve discomfort and anxiety. Moderate conscious sedation is a type of sedation. Under moderate conscious sedation you are less alert than normal but are still able to respond to instructions or stimulation. Moderate conscious sedation is used during short medical and dental procedures. It is milder than deep sedation or general anesthesia and allows you to return to your regular activities sooner. °LET YOUR HEALTH CARE PROVIDER KNOW ABOUT:  °· Any allergies you have. °· All medicines you are taking, including vitamins, herbs, eye drops, creams, and over-the-counter medicines. °· Use of steroids (by mouth or creams). °· Previous problems you or members of your family have had with the use of anesthetics. °· Any blood disorders you have. °· Previous surgeries you have had. °· Medical conditions you have. °· Possibility of pregnancy, if this applies. °· Use of cigarettes, alcohol, or illegal drugs. °RISKS AND COMPLICATIONS °Generally, this is a safe procedure. However, as with any procedure, problems can occur. Possible problems include: °· Oversedation. °· Trouble breathing on your own. You may need to have a breathing tube until you are awake and breathing on your own. °· Allergic reaction to any of the medicines used for the procedure. °BEFORE THE PROCEDURE °· You may have blood tests done. These tests can help show how well your kidneys and liver are working. They can also show how well your blood clots. °· A physical exam will be done.   °· Only take medicines as directed by your health care provider. You may need to stop taking medicines (such as blood thinners, aspirin, or nonsteroidal anti-inflammatory drugs) before the procedure.   °· Do not eat or drink at least 6 hours before the procedure or as directed by your health care provider. °· Arrange for a responsible adult, family member, or friend to take you home  after the procedure. He or she should stay with you for at least 24 hours after the procedure, until the medicine has worn off. °PROCEDURE  °· An intravenous (IV) catheter will be inserted into one of your veins. Medicine will be able to flow directly into your body through this catheter. You may be given medicine through this tube to help prevent pain and help you relax. °· The medical or dental procedure will be done. °AFTER THE PROCEDURE °· You will stay in a recovery area until the medicine has worn off. Your blood pressure and pulse will be checked.   °·  Depending on the procedure you had, you may be allowed to go home when you can tolerate liquids and your pain is under control. °  °This information is not intended to replace advice given to you by your health care provider. Make sure you discuss any questions you have with your health care provider. °  °Document Released: 06/19/2001 Document Revised: 10/15/2014 Document Reviewed: 06/01/2013 °Elsevier Interactive Patient Education ©2016 [employer] Inc. °Implanted Port Insertion, Care After °Refer to this sheet in the next few weeks. These instructions provide you with information on caring for yourself after your procedure. Your health care provider may also give you more specific instructions. Your treatment has been planned according to current medical practices, but problems sometimes occur. Call your health care provider if you have any problems or questions after your procedure. °WHAT TO EXPECT AFTER THE PROCEDURE °After your procedure, it is typical to have the following:  °· Discomfort at the port insertion site. Ice packs to the area will help. °· Bruising   on the skin over the port. This will subside in 3-4 days. °HOME CARE INSTRUCTIONS °· After your port is placed, you will get a manufacturer's information card. The card has information about your port. Keep this card with you at all times.   °· Know what kind of port you have. There are many types of  ports available.   °· Wear a medical alert bracelet in case of an emergency. This can help alert health care workers that you have a port.   °· The port can stay in for as long as your health care provider believes it is necessary.   °· A home health care nurse may give medicines and take care of the port.   °· You or a family member can get special training and directions for giving medicine and taking care of the port at home.   °SEEK MEDICAL CARE IF:  °· Your port does not flush or you are unable to get a blood return.   °· You have a fever or chills. °SEEK IMMEDIATE MEDICAL CARE IF: °· You have new fluid or pus coming from your incision.   °· You notice a bad smell coming from your incision site.   °· You have swelling, pain, or more redness at the incision or port site.   °· You have chest pain or shortness of breath. °  °This information is not intended to replace advice given to you by your health care provider. Make sure you discuss any questions you have with your health care provider. °  °Document Released: 07/15/2013 Document Revised: 09/29/2013 Document Reviewed: 07/15/2013 °Elsevier Interactive Patient Education ©2016 Elsevier Inc. °Implanted Port Home Guide °An implanted port is a type of central line that is placed under the skin. Central lines are used to provide IV access when treatment or nutrition needs to be given through a person's veins. Implanted ports are used for long-term IV access. An implanted port may be placed because:  °· You need IV medicine that would be irritating to the small veins in your hands or arms.   °· You need long-term IV medicines, such as antibiotics.   °· You need IV nutrition for a long period.   °· You need frequent blood draws for lab tests.   °· You need dialysis.   °Implanted ports are usually placed in the chest area, but they can also be placed in the upper arm, the abdomen, or the leg. An implanted port has two main parts:  °· Reservoir. The reservoir is round  and will appear as a small, raised area under your skin. The reservoir is the part where a needle is inserted to give medicines or draw blood.   °· Catheter. The catheter is a thin, flexible tube that extends from the reservoir. The catheter is placed into a large vein. Medicine that is inserted into the reservoir goes into the catheter and then into the vein.   °HOW WILL I CARE FOR MY INCISION SITE? °Do not get the incision site wet. Bathe or shower as directed by your health care provider.  °HOW IS MY PORT ACCESSED? °Special steps must be taken to access the port:  °· Before the port is accessed, a numbing cream can be placed on the skin. This helps numb the skin over the port site.   °· Your health care provider uses a sterile technique to access the port. °· Your health care provider must put on a mask and sterile gloves. °· The skin over your port is cleaned carefully with an antiseptic and allowed to dry. °· The port   is gently pinched between sterile gloves, and a needle is inserted into the port. °· Only "non-coring" port needles should be used to access the port. Once the port is accessed, a blood return should be checked. This helps ensure that the port is in the vein and is not clogged.   °· If your port needs to remain accessed for a constant infusion, a clear (transparent) bandage will be placed over the needle site. The bandage and needle will need to be changed every week, or as directed by your health care provider.   °· Keep the bandage covering the needle clean and dry. Do not get it wet. Follow your health care provider's instructions on how to take a shower or bath while the port is accessed.   °· If your port does not need to stay accessed, no bandage is needed over the port.   °WHAT IS FLUSHING? °Flushing helps keep the port from getting clogged. Follow your health care provider's instructions on how and when to flush the port. Ports are usually flushed with saline solution or a medicine called  heparin. The need for flushing will depend on how the port is used.  °· If the port is used for intermittent medicines or blood draws, the port will need to be flushed:   °· After medicines have been given.   °· After blood has been drawn.   °· As part of routine maintenance.   °· If a constant infusion is running, the port may not need to be flushed.   °HOW LONG WILL MY PORT STAY IMPLANTED? °The port can stay in for as long as your health care provider thinks it is needed. When it is time for the port to come out, surgery will be done to remove it. The procedure is similar to the one performed when the port was put in.  °WHEN SHOULD I SEEK IMMEDIATE MEDICAL CARE? °When you have an implanted port, you should seek immediate medical care if:  °· You notice a bad smell coming from the incision site.   °· You have swelling, redness, or drainage at the incision site.   °· You have more swelling or pain at the port site or the surrounding area.   °· You have a fever that is not controlled with medicine. °  °This information is not intended to replace advice given to you by your health care provider. Make sure you discuss any questions you have with your health care provider. °  °Document Released: 09/24/2005 Document Revised: 07/15/2013 Document Reviewed: 06/01/2013 °Elsevier Interactive Patient Education ©2016 Elsevier Inc. °Moderate Conscious Sedation, Adult, Care After °Refer to this sheet in the next few weeks. These instructions provide you with information on caring for yourself after your procedure. Your health care provider may also give you more specific instructions. Your treatment has been planned according to current medical practices, but problems sometimes occur. Call your health care provider if you have any problems or questions after your procedure. °WHAT TO EXPECT AFTER THE PROCEDURE  °After your procedure: °· You may feel sleepy, clumsy, and have poor balance for several hours. °· Vomiting may occur if  you eat too soon after the procedure. °HOME CARE INSTRUCTIONS °· Do not participate in any activities where you could become injured for at least 24 hours. Do not: °¨ Drive. °¨ Swim. °¨ Ride a bicycle. °¨ Operate heavy machinery. °¨ Cook. °¨ Use power tools. °¨ Climb ladders. °¨ Work from a high place. °· Do not make important decisions or sign legal documents until you are improved. °·   If you vomit, drink water, juice, or soup when you can drink without vomiting. Make sure you have little or no nausea before eating solid foods.  Only take over-the-counter or prescription medicines for pain, discomfort, or fever as directed by your health care provider.  Make sure you and your family fully understand everything about the medicines given to you, including what side effects may occur.  You should not drink alcohol, take sleeping pills, or take medicines that cause drowsiness for at least 24 hours.  If you smoke, do not smoke without supervision.  If you are feeling better, you may resume normal activities 24 hours after you were sedated.  Keep all appointments with your health care provider. SEEK MEDICAL CARE IF:  Your skin is pale or bluish in color.  You continue to feel nauseous or vomit.  Your pain is getting worse and is not helped by medicine.  You have bleeding or swelling.  You are still sleepy or feeling clumsy after 24 hours. SEEK IMMEDIATE MEDICAL CARE IF:  You develop a rash.  You have difficulty breathing.  You develop any type of allergic problem.  You have a fever. MAKE SURE YOU:  Understand these instructions.  Will watch your condition.  Will get help right away if you are not doing well or get worse.   This information is not intended to replace advice given to you by your health care provider. Make sure you discuss any questions you have with your health care provider.   Document Released: 07/15/2013 Document Revised: 10/15/2014 Document Reviewed:  07/15/2013 Elsevier Interactive Patient Education [employer].

## 2015-11-10 NOTE — Progress Notes (Signed)
Patient ID: Kristopher Johnson, male   DOB: Feb 12, 1956, 60 y.o.   MRN: [id number]    Referring Physician(s): Brunetta Genera  Chief Complaint:  "I'm here for a port a cath"  Subjective:  Pt familiar to IR service from recent inguinal LN biopsy on 10/25/15. He has history of newly diagnosed Mantle cell lymphoma and presents again today for port a cath placement for chemotherapy. He denies recent fever, chills, HA,CP, cough, back pain,N/V or bleeding. He does have some occ dyspnea as well as abd discomfort.   Allergies: Review of patient's allergies indicates no known allergies.  Medications: Prior to Admission medications   Medication Sig Start Date End Date Taking? Authorizing Provider  allopurinol (ZYLOPRIM) 300 MG tablet Take 1 tablet (300 mg total) by mouth daily. Patient not taking: Reported on 11/07/2015 11/07/15   Brunetta Genera, MD  dexamethasone (DECADRON) 4 MG tablet Take 1 tablet (4 mg total) by mouth 2 (two) times daily with breakfast and lunch. 11/01/15   Brunetta Genera, MD  LORazepam (ATIVAN) 0.5 MG tablet Take 1 tablet (0.5 mg total) by mouth every 6 (six) hours as needed (Nausea or vomiting). Patient not taking: Reported on 11/07/2015 11/07/15   Brunetta Genera, MD  ondansetron (ZOFRAN) 8 MG tablet Take 1 tablet (8 mg total) by mouth 2 (two) times daily as needed for refractory nausea / vomiting. Patient not taking: Reported on 11/07/2015 11/07/15   Brunetta Genera, MD  predniSONE (DELTASONE) 20 MG tablet Take 4 tablets (80 mg total) by mouth daily. Take on days 1-5 of chemotherapy. Patient not taking: Reported on 11/07/2015 11/07/15   Brunetta Genera, MD  prochlorperazine (COMPAZINE) 10 MG tablet Take 1 tablet (10 mg total) by mouth every 6 (six) hours as needed (Nausea or vomiting). Patient not taking: Reported on 11/07/2015 11/07/15   Brunetta Genera, MD  sulfamethoxazole-trimethoprim (BACTRIM DS,SEPTRA DS) 800-160 MG tablet Take 1 tablet by mouth 2  (two) times daily. 10/27/15   Historical Provider, MD     Vital Signs: BP 121/67 mmHg  Pulse 96  Temp(Src) 98.1 F (36.7 C) (Oral)  Resp 18  Ht 5\' 9"  (1.753 m)  Wt 174 lb 14 oz (79.323 kg)  BMI 25.81 kg/m2  SpO2 95%  Physical Exam legally blind; awake/alert; chest- sl dim BS bases; heart- RRR; abd- soft,+BS,NT; LE- no edema  Imaging: No results found.  Labs:  CBC:  Recent Labs  10/25/15 0609 11/01/15 1015 11/07/15 0836 11/10/15 0935  WBC 9.1 11.2* 14.1* 10.8*  HGB 11.8* 12.9* 12.8* 12.5*  HCT 37.4* 40.2 40.1 38.1*  PLT 219 240 256 257    COAGS:  Recent Labs  10/25/15 0609 11/10/15 1048  INR 1.15 0.98  APTT 33  --     BMP:  Recent Labs  10/24/15 1426 11/01/15 1015 11/07/15 0836  NA 137 134* 139  K 4.2 4.9 4.1  CL 101  --   --   CO2 25 25 26   GLUCOSE 178* 113 109  BUN 11 15.2 19.5  CALCIUM 9.0 9.2 9.7  CREATININE 1.10 1.2 1.0  GFRNONAA >60  --   --   GFRAA >60  --   --     LIVER FUNCTION TESTS:  Recent Labs  10/24/15 1426 11/01/15 1015 11/07/15 0836  BILITOT 0.4 0.35 <0.30  AST 24 19 16   ALT 16* 12 17  ALKPHOS 84 91 83  PROT 6.2* 6.8 7.1  ALBUMIN 2.9* 2.9* 3.1*    Assessment and Plan:  Pt with history of newly diagnosed Mantle cell lymphoma ; plan is  for port a cath placement today for chemotherapy.Risks and Benefits discussed with the patient including, but not limited to bleeding, infection, pneumothorax, or fibrin sheath development and need for additional procedures.All of the patient's questions were answered, patient is agreeable to proceed.Consent signed and in chart.     Electronically Signed: D. Rowe Shawnta 11/10/2015, 11:40 AM   I spent a total of 15 minutes at the the patient's bedside AND on the patient's hospital floor or unit, greater than 50% of which was counseling/coordinating care for port a cath placement

## 2015-11-11 ENCOUNTER — Encounter: Payer: Self-pay | Admitting: Pharmacist

## 2015-11-14 ENCOUNTER — Ambulatory Visit (HOSPITAL_COMMUNITY)
Admission: RE | Admit: 2015-11-14 | Discharge: 2015-11-14 | Disposition: A | Discharge status: Home / Self Care | Payer: Medicare Other | Source: Ambulatory Visit | Attending: Hematology and Oncology | Admitting: Hematology and Oncology

## 2015-11-14 DIAGNOSIS — K4020 Bilateral inguinal hernia, without obstruction or gangrene, not specified as recurrent: Secondary | ICD-10-CM | POA: Diagnosis not present

## 2015-11-14 DIAGNOSIS — C8590 Non-Hodgkin lymphoma, unspecified, unspecified site: Secondary | ICD-10-CM | POA: Diagnosis not present

## 2015-11-14 DIAGNOSIS — E049 Nontoxic goiter, unspecified: Secondary | ICD-10-CM | POA: Insufficient documentation

## 2015-11-14 DIAGNOSIS — J90 Pleural effusion, not elsewhere classified: Secondary | ICD-10-CM | POA: Insufficient documentation

## 2015-11-14 DIAGNOSIS — C8598 Non-Hodgkin lymphoma, unspecified, lymph nodes of multiple sites: Secondary | ICD-10-CM | POA: Diagnosis not present

## 2015-11-14 DIAGNOSIS — Z79899 Other long term (current) drug therapy: Secondary | ICD-10-CM | POA: Insufficient documentation

## 2015-11-14 LAB — GLUCOSE, CAPILLARY: GLUCOSE-CAPILLARY: 99 mg/dL (ref 65–99)

## 2015-11-14 MED ORDER — FLUDEOXYGLUCOSE F - 18 (FDG) INJECTION
8.5400 | Freq: Once | INTRAVENOUS | Status: AC | PRN
  Administered 2015-11-14: 8.54 via INTRAVENOUS

## 2015-11-14 NOTE — Progress Notes (Signed)
[name] Kitchen    HEMATOLOGY/ONCOLOGY CLINIC NOTE  Date of Service: .11/07/2015   Patient Care Team: No Pcp Per Patient as PCP - General (General Practice)  CHIEF COMPLAINTS/PURPOSE OF CONSULTATION:  Post hospitalization followup for New Diagnosed Mantle Cell lymphoma  HISTORY OF PRESENTING ILLNESS: plz see clinic note for details on initial presentation  Interval History  Kristopher Johnson is here for followup alongwith his daughter who has arrived from out of state. We discussed his diagnosis, prognosis, treatment options and made a final decision to proceed with R-CHOP regimen. Patient is not keen to even consider the option of a AutoHSCT at this time. Notes poor po intake , ongoing weight loss. Notes the dexamethasone has improved his appetite and addressed the night sweats. He was seen by surgery for his skin lesions and was started on bactrim for possible cellulitis. He feels ready to get started with treatment. Completed the chemotherapy counseling class.  MEDICAL HISTORY:  Past Medical History  Diagnosis Date  . Retinitis pigmentosa     Patient notes that he has been declared legally blind since 1983 and is on Social Security disability  . Abscess of arm, right 10/27/2015  . Lymphoma of lymph nodes of multiple sites ([place]) 10/27/2015  . Shortness of breath 10/27/2015    SURGICAL HISTORY: Past Surgical History  Procedure Laterality Date  . Cataract surgery      Patient notes she has had bilateral Surgery in 1998 and 99  . Tonsillectomy      about 60years old    SOCIAL HISTORY: Social History   Social History  . Marital Status: Widowed    Spouse Name: N/A  . Number of Children: N/A  . Years of Education: N/A   Occupational History  . Not on file.   Social History Main Topics  . Smoking status: Current Every Day Smoker -- 1.00 packs/day for 33 years  . Smokeless tobacco: Never Used  . Alcohol Use: No  . Drug Use: No  . Sexual Activity: Not on file     Comment: Disabled, retinis  pigmentosa legally blind, 4children MPOA Lynnn(cousin)   Other Topics Concern  . Not on file   Social History Narrative    FAMILY HISTORY: History reviewed. No pertinent family history.  ALLERGIES:  has No Known Allergies.  MEDICATIONS:  Current Outpatient Prescriptions  Medication Sig Dispense Refill  . dexamethasone (DECADRON) 4 MG tablet Take 1 tablet (4 mg total) by mouth 2 (two) times daily with breakfast and lunch. 60 tablet 0  . sulfamethoxazole-trimethoprim (BACTRIM DS,SEPTRA DS) 800-160 MG tablet Take 1 tablet by mouth 2 (two) times daily.  0  . allopurinol (ZYLOPRIM) 300 MG tablet Take 1 tablet (300 mg total) by mouth daily. (Patient not taking: Reported on 11/07/2015) 30 tablet 0  . LORazepam (ATIVAN) 0.5 MG tablet Take 1 tablet (0.5 mg total) by mouth every 6 (six) hours as needed (Nausea or vomiting). (Patient not taking: Reported on 11/07/2015) 30 tablet 0  . ondansetron (ZOFRAN) 8 MG tablet Take 1 tablet (8 mg total) by mouth 2 (two) times daily as needed for refractory nausea / vomiting. (Patient not taking: Reported on 11/07/2015) 30 tablet 1  . predniSONE (DELTASONE) 20 MG tablet Take 4 tablets (80 mg total) by mouth daily. Take on days 1-5 of chemotherapy. (Patient not taking: Reported on 11/07/2015) 60 tablet 1  . prochlorperazine (COMPAZINE) 10 MG tablet Take 1 tablet (10 mg total) by mouth every 6 (six) hours as needed (Nausea or vomiting). (Patient not  taking: Reported on 11/07/2015) 30 tablet 6   No current facility-administered medications for this visit.    REVIEW OF SYSTEMS:    10 Point review of Systems was done is negative except as noted above.  PHYSICAL EXAMINATION: ECOG PERFORMANCE STATUS: 2 - Symptomatic, <50% confined to bed  . Filed Vitals:   11/07/15 0850  BP: 124/69  Pulse: 90  Temp: 97.6 F (36.4 C)  Resp: 18   Filed Weights   11/07/15 0850  Weight: 174 lb 14.4 oz (79.334 kg)   .Body mass index is 25.82 kg/(m^2).  GENERAL:alert, in no  acute distress and comfortable SKIN: skin color, texture, turgor are normal, no rashes or significant lesions EYES: normal, conjunctiva are pink and non-injected, sclera clear. Poor visual acuity - some directional light perception but difficulty with finger counting. OROPHARYNX:no exudate, no erythema and lips, buccal mucosa, and tongue normal  NECK: supple, no JVD, thyroid normal size, non-tender, without nodularity LYMPH:  no palpable lymphadenopathy in the cervical, axillary or inguinal LUNGS: clear to auscultation with normal respiratory effort HEART: regular rate & rhythm,  no murmurs and no lower extremity edema ABDOMEN: abdomen soft, non-tender, normoactive bowel sounds  Musculoskeletal: no cyanosis of digits and no clubbing  PSYCH: alert & oriented x 3 with fluent speech NEURO: no focal motor/sensory deficits  LABORATORY DATA:  I have reviewed the data as listed  . CBC Latest Ref Rng 11/10/2015 11/07/2015 11/01/2015  WBC 4.0 - 10.5 K/uL 10.8(H) 14.1(H) 11.2(H)  Hemoglobin 13.0 - 17.0 g/dL 12.5(L) 12.8(L) 12.9(L)  Hematocrit 39.0 - 52.0 % 38.1(L) 40.1 40.2  Platelets 150 - 400 K/uL 257 256 240    . CMP Latest Ref Rng 11/07/2015 11/01/2015 10/24/2015  Glucose 70 - 140 mg/dl 109 113 178(H)  BUN 7.0 - 26.0 mg/dL 19.5 15.2 11  Creatinine 0.7 - 1.3 mg/dL 1.0 1.2 1.10  Sodium 136 - 145 mEq/L 139 134(L) 137  Potassium 3.5 - 5.1 mEq/L 4.1 4.9 4.2  Chloride 101 - 111 mmol/L - - 101  CO2 22 - 29 mEq/L 26 25 25   Calcium 8.4 - 10.4 mg/dL 9.7 9.2 9.0  Total Protein 6.4 - 8.3 g/dL 7.1 6.8 6.2(L)  Total Bilirubin 0.20 - 1.20 mg/dL <0.30 0.35 0.4  Alkaline Phos 40 - 150 U/L 83 91 84  AST 5 - 34 U/L 16 19 24   ALT 0 - 55 U/L 17 12 16(L)   Component     Latest Ref Rng 10/25/2015 11/01/2015  HIV     Non Reactive Non Reactive   HCV Ab     0.0 - 0.9 s/co ratio 0.2   Hepatitis B Surface Ag     Negative Negative   Hep B S Ab      Non Reactive   Comment      Comment   LDH     125 - 245 U/L   441 (H)   . Lab Results  Component Value Date   LDH 450* 11/07/2015    Uric acid 7.1      RADIOGRAPHIC STUDIES: I have personally reviewed the radiological images as listed and agreed with the findings in the report. Ct Soft Tissue Neck W Contrast  10/24/2015  CLINICAL DATA:  Concerning for lymphoma. Abdominal pain, urinary retention. Imaging today demonstrates possible lymphoma. EXAM: CT NECK AND CHEST WITHOUT CONTRAST COMPARISON:  None. FINDINGS: CT NECK FINDINGS (limited assessment without contrast) Pharynx and larynx: Normal. Salivary glands: Normal. Thyroid: Probable lymphadenopathy versus thyromegaly, difficult to discern without  contrast. Lymph nodes: Lymphadenopathy along the cervical changing, including 14 mm short access RIGHT level IIa lymph node, multiple presumed lymph nodes level 3 and 4. Probable LEFT supraclavicular lymphadenopathy though, difficult to differentiate from vascular structures. Vascular: Limited assessment. Limited intracranial: Normal. Visualized orbits: Abnormal soft tissue within the retrobulbar fat at the optic nerve insertion, LEFT greater than RIGHT. The ocular globes are intact, status post bilateral ocular lens implants. Extraocular muscles are unremarkable. Preservation of orbital fat. Mastoids and visualized paranasal sinuses: Well aerated. Skeleton: No destructive bony lesions. Mild to moderate degenerative change of the cervical spine. CT CHEST FINDINGS Mediastinum/Lymph Nodes: Mediastinal lymphadenopathy, including LEFT peritracheal at least 22 mm nodal conglomeration. Bilateral presumed hilar lymphadenopathy. Internal mammary chain lymphadenopathy on the RIGHT measuring up to 14 mm. Bulky bilateral axillary lymphadenopathy measuring up to 28 mm short access. 19 mm short access cardio phrenic angle lymph node. Heart size normal. No pericardial effusions. Lungs/Pleura: Innumerable, diffuse subcentimeter centrilobular nodules and tree-in-bud infiltrates.  Small bilateral pleural effusions with compressive atelectasis. Tracheobronchial tree is patent and midline. No pneumothorax. Musculoskeletal: No chest wall mass or suspicious bone lesions identified. IMPRESSION: CT NECK: Limited non contrast assessment. Extensive lymphadenopathy compatible with lymphoma, difficult to differentiate from underlying structures due to lack of contrast. Patent airway. Bilateral orbital retrobulbar masses most consistent with lymphoma. This would be better characterized on MRI of the orbits with contrast on a nonemergent basis. CT CHEST: Limited noncontrast assessment. Extensive lymphadenopathy consistent with lymphoma. Diffuse centrilobular nodules and tree-in-bud infiltrates most consistent with lymphangitic spread of lymphoma though, and infection could have a similar appearance. Small bilateral pleural effusions are likely malignant. Electronically Signed   By: Elon Alas M.D.   On: 10/24/2015 22:17   Ct Chest W Contrast  10/24/2015  CLINICAL DATA:  Concerning for lymphoma. Abdominal pain, urinary retention. Imaging today demonstrates possible lymphoma. EXAM: CT NECK AND CHEST WITHOUT CONTRAST COMPARISON:  None. FINDINGS: CT NECK FINDINGS (limited assessment without contrast) Pharynx and larynx: Normal. Salivary glands: Normal. Thyroid: Probable lymphadenopathy versus thyromegaly, difficult to discern without contrast. Lymph nodes: Lymphadenopathy along the cervical changing, including 14 mm short access RIGHT level IIa lymph node, multiple presumed lymph nodes level 3 and 4. Probable LEFT supraclavicular lymphadenopathy though, difficult to differentiate from vascular structures. Vascular: Limited assessment. Limited intracranial: Normal. Visualized orbits: Abnormal soft tissue within the retrobulbar fat at the optic nerve insertion, LEFT greater than RIGHT. The ocular globes are intact, status post bilateral ocular lens implants. Extraocular muscles are unremarkable.  Preservation of orbital fat. Mastoids and visualized paranasal sinuses: Well aerated. Skeleton: No destructive bony lesions. Mild to moderate degenerative change of the cervical spine. CT CHEST FINDINGS Mediastinum/Lymph Nodes: Mediastinal lymphadenopathy, including LEFT peritracheal at least 22 mm nodal conglomeration. Bilateral presumed hilar lymphadenopathy. Internal mammary chain lymphadenopathy on the RIGHT measuring up to 14 mm. Bulky bilateral axillary lymphadenopathy measuring up to 28 mm short access. 19 mm short access cardio phrenic angle lymph node. Heart size normal. No pericardial effusions. Lungs/Pleura: Innumerable, diffuse subcentimeter centrilobular nodules and tree-in-bud infiltrates. Small bilateral pleural effusions with compressive atelectasis. Tracheobronchial tree is patent and midline. No pneumothorax. Musculoskeletal: No chest wall mass or suspicious bone lesions identified. IMPRESSION: CT NECK: Limited non contrast assessment. Extensive lymphadenopathy compatible with lymphoma, difficult to differentiate from underlying structures due to lack of contrast. Patent airway. Bilateral orbital retrobulbar masses most consistent with lymphoma. This would be better characterized on MRI of the orbits with contrast on a nonemergent basis. CT CHEST: Limited noncontrast  assessment. Extensive lymphadenopathy consistent with lymphoma. Diffuse centrilobular nodules and tree-in-bud infiltrates most consistent with lymphangitic spread of lymphoma though, and infection could have a similar appearance. Small bilateral pleural effusions are likely malignant. Electronically Signed   By: Elon Alas M.D.   On: 10/24/2015 22:17   Ct Abdomen Pelvis W Contrast  10/24/2015  CLINICAL DATA:  Upper abdominal pain and bloating for 2 months. EXAM: CT ABDOMEN AND PELVIS WITH CONTRAST TECHNIQUE: Multidetector CT imaging of the abdomen and pelvis was performed using the standard protocol following bolus  administration of intravenous contrast. CONTRAST:  134mL OMNIPAQUE IOHEXOL 300 MG/ML  SOLN COMPARISON:  None. FINDINGS: Lower chest: Nodularity along the bronchovascular bundles in both lower lobes could be lymphoma in his involvement of the lungs. There are bilateral pleural effusions and overlying atelectasis. There is also a 17 mm epicardial lymph node on image 9. No pericardial effusion. Small pleural nodules are also noted. Retrocrural adenopathy is also noted. Hepatobiliary: Simple appearing hepatic cyst in the left lobe. No worrisome hepatic lesions or intrahepatic biliary dilatation. The gallbladder is unremarkable. No common bile duct dilatation. Pancreas: No mass, inflammation or ductal dilatation. Spleen: Normal size.  No focal lesions. Adrenals/Urinary Tract: The adrenal glands are grossly normal. There is a thick rim soft tissue thickening along the posterior aspect of the left kidney which could be lymphomatous involvement. A left renal calculus is also noted. There is also a a thick rim of soft tissue density along the left renal pelvis extending down along the inferior margin of the kidney which is likely lymphoma. This measures a maximum of 3.5 cm on image number 41. Stomach/Bowel: The stomach is markedly thickened and somewhat nodular and irregular. Findings suspicious for gastric lymphoma. The duodenum, small bowel and colon are grossly normal. No obvious inflammatory changes or mass lesions. No obstructive findings. The terminal ileum is normal. The appendix is normal. Vascular/Lymphatic: Bulky mesenteric and retroperitoneal lymphadenopathy. Large upper abdominal mesenteric mass measures 12 x 7.5 cm on image number 22. There is also bulky retroperitoneal adenopathy with an index node between the aorta and left kidney measuring 30 x 24 mm on image number 32. There is also bilateral pelvic and inguinal lymphadenopathy. An index right external iliac lymph node on image number 69 measures a maximum  of 21 mm short axis. Right inguinal lymph node measures 27 x 16 mm on image number 87. The aorta is normal in caliber. The branch vessels are patent. The major venous structures are patent. Mild compression of the IVC by the bulky adenopathy. Other: The bladder is normal. The prostate gland is mildly enlarged with median lobe hypertrophy impressing on the base of the bladder. The seminal vesicles are normal. No free pelvic fluid collections. Bilateral inguinal hernias containing small bowel loops. Musculoskeletal: No obvious changes of osseous lymphoma. IMPRESSION: 1. Constellation of findings most consistent with lymphoma. 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 2. Suspect lymphomatous involvement of the lungs. There also bilateral pleural effusions and overlying atelectasis. No obvious involvement of the liver or spleen. Bilateral inguinal hernias containing small bowel loops. Electronically Signed   By: Marijo Sanes M.D.   On: 10/24/2015 19:15   US Biopsy  10/25/2015  CLINICAL DATA:  60 year old male with new diagnosis of profound adenopathy concerning for advanced lymphoma. He presents for ultrasound-guided core biopsy to obtain tissue diagnosis. EXAM: ULTRASOUND BIOPSY LYMPH NODE Date: 10/25/2015 PROCEDURE: 1. Ultrasound-guided  core biopsy of right superficial inguinal lymph nodes Interventional Radiologist:  Criselda Peaches, MD ANESTHESIA/SEDATION: 50 mcg fentanyl administered intravenously. MEDICATIONS: None additional TECHNIQUE: Informed consent was obtained from the patient following explanation of the procedure, risks, benefits and alternatives. The patient understands, agrees and consents for the procedure. All questions were addressed. A time out was performed. The right groin was interrogated with ultrasound. Multiple enlarged superficial  inguinal lymph nodes are identified. The nodes are superficial to the common femoral artery and vein. An appropriate skin entry site was selected and marked. The region was sterilely prepped and draped in standard fashion using chlorhexidine skin prep. Under real-time sonographic guidance, multiple 16 gauge core biopsies were then coaxially obtained. Needle placement was confirmed on all biopsy passes with real-time sonography. Biopsy specimens were placed in saline and delivered to pathology for further analysis. Post biopsy ultrasound imaging demonstrates no active bleeding or hematoma. The patient tolerated the procedure well. COMPLICATIONS: None. Estimated blood loss: 0 IMPRESSION: Technically successful ultrasound-guided core biopsy of superficial right inguinal adenopathy. Signed, Criselda Peaches, MD [facility] Radiology Electronically Signed   By: Jacqulynn Cadet M.D.   On: 10/25/2015 14:25   Ir Fluoro Guide Cv Line Right  11/10/2015  INDICATION: 60 year old male with a history of mantle cell lymphoma in need of durable central venous access for chemotherapy. EXAM: IMPLANTED PORT A CATH PLACEMENT WITH ULTRASOUND AND FLUOROSCOPIC GUIDANCE MEDICATIONS: 2 g Ancef; The antibiotic was administered within an appropriate time interval prior to skin puncture. ANESTHESIA/SEDATION: Versed 2 mg IV; Fentanyl 75 mcg IV; Moderate Sedation Time:  19 The patient was continuously monitored during the procedure by the interventional radiology nurse under my direct supervision. FLUOROSCOPY TIME:  0 minutes, 18 seconds (2 mGy) COMPLICATIONS: None immediate. Estimated blood loss: None PROCEDURE: The right neck and chest was prepped with chlorhexidine, and draped in the usual sterile fashion using maximum barrier technique (cap and mask, sterile gown, sterile gloves, large sterile sheet, hand hygiene and cutaneous antiseptic). Antibiotic prophylaxis was provided with 2g  Ancef administered IV one hour prior to skin incision. Local anesthesia was attained by infiltration with 1% lidocaine with epinephrine. Ultrasound demonstrated patency of the right internal jugular vein, and this was documented with an image. Under real-time ultrasound guidance, this vein was accessed with a 21 gauge micropuncture needle and image documentation was performed. A small dermatotomy was made at the access site with an 11 scalpel. A 0.018" wire was advanced into the SVC and the access needle exchanged for a 51F micropuncture vascular sheath. The 0.018" wire was then removed and a 0.035" wire advanced into the IVC. An appropriate location for the subcutaneous reservoir was selected below the clavicle and an incision was made through the skin and underlying soft tissues. The subcutaneous tissues were then dissected using a combination of blunt and sharp surgical technique and a pocket was formed. A single lumen power injectable portacatheter was then tunneled through the subcutaneous tissues from the pocket to the dermatotomy and the port reservoir placed within the subcutaneous pocket. The venous access site was then serially dilated and a peel away vascular sheath placed over the wire. The wire was removed and the port catheter advanced into position under fluoroscopic guidance. The catheter tip is positioned in the upper right atrium. This was documented with a spot image. The portacatheter was then tested and found to flush and aspirate well. The port was flushed with saline followed by 100 units/mL heparinized saline. The  pocket was then closed in two layers using first subdermal inverted interrupted absorbable sutures followed by a running subcuticular suture. The epidermis was then sealed with Dermabond. The dermatotomy at the venous access site was also closed with a single inverted subdermal suture and the epidermis sealed with Dermabond. IMPRESSION: Successful placement of a right IJ approach  Power Port with ultrasound and fluoroscopic guidance. The catheter is ready for use. Signed, Criselda Peaches, MD [facility] Radiology Electronically Signed   By: Jacqulynn Cadet M.D.   On: 11/10/2015 13:10   Ir US Guide Vasc Access Right  11/10/2015  INDICATION: 60 year old male with a history of mantle cell lymphoma in need of durable central venous access for chemotherapy. EXAM: IMPLANTED PORT A CATH PLACEMENT WITH ULTRASOUND AND FLUOROSCOPIC GUIDANCE MEDICATIONS: 2 g Ancef; The antibiotic was administered within an appropriate time interval prior to skin puncture. ANESTHESIA/SEDATION: Versed 2 mg IV; Fentanyl 75 mcg IV; Moderate Sedation Time:  19 The patient was continuously monitored during the procedure by the interventional radiology nurse under my direct supervision. FLUOROSCOPY TIME:  0 minutes, 18 seconds (2 mGy) COMPLICATIONS: None immediate. Estimated blood loss: None PROCEDURE: The right neck and chest was prepped with chlorhexidine, and draped in the usual sterile fashion using maximum barrier technique (cap and mask, sterile gown, sterile gloves, large sterile sheet, hand hygiene and cutaneous antiseptic). Antibiotic prophylaxis was provided with 2g Ancef administered IV one hour prior to skin incision. Local anesthesia was attained by infiltration with 1% lidocaine with epinephrine. Ultrasound demonstrated patency of the right internal jugular vein, and this was documented with an image. Under real-time ultrasound guidance, this vein was accessed with a 21 gauge micropuncture needle and image documentation was performed. A small dermatotomy was made at the access site with an 11 scalpel. A 0.018" wire was advanced into the SVC and the access needle exchanged for a 62F micropuncture vascular sheath. The 0.018" wire was then removed and a 0.035" wire advanced into the IVC. An appropriate location for the subcutaneous reservoir was selected below  the clavicle and an incision was made through the skin and underlying soft tissues. The subcutaneous tissues were then dissected using a combination of blunt and sharp surgical technique and a pocket was formed. A single lumen power injectable portacatheter was then tunneled through the subcutaneous tissues from the pocket to the dermatotomy and the port reservoir placed within the subcutaneous pocket. The venous access site was then serially dilated and a peel away vascular sheath placed over the wire. The wire was removed and the port catheter advanced into position under fluoroscopic guidance. The catheter tip is positioned in the upper right atrium. This was documented with a spot image. The portacatheter was then tested and found to flush and aspirate well. The port was flushed with saline followed by 100 units/mL heparinized saline. The pocket was then closed in two layers using first subdermal inverted interrupted absorbable sutures followed by a running subcuticular suture. The epidermis was then sealed with Dermabond. The dermatotomy at the venous access site was also closed with a single inverted subdermal suture and the epidermis sealed with Dermabond. IMPRESSION: Successful placement of a right IJ approach Power Port with ultrasound and fluoroscopic guidance. The catheter is ready for use. Signed, Criselda Peaches, MD [facility] Radiology Electronically Signed   By: Jacqulynn Cadet M.D.   On: 11/10/2015 13:10    ECHO: 11/01/2015: Study Conclusions  - Left ventricle: The  cavity size was normal. There was mild concentric hypertrophy. Systolic function was normal. The estimated ejection fraction was in the range of 50% to 55%. Wall motion was normal; there were no regional wall motion abnormalities. Doppler parameters are consistent with abnormal left ventricular relaxation (grade 1 diastolic dysfunction). - Aortic valve:  Transvalvular velocity was within the normal range. There was no stenosis. There was no regurgitation. - Mitral valve: There was no regurgitation. - Right ventricle: The cavity size was normal. Wall thickness was normal. Systolic function was normal. - Tricuspid valve: There was no regurgitation. - Inferior vena cava: The vessel was normal in size. The respirophasic diameter changes were in the normal range (>= 50%), consistent with normal central venous pressure.Study Conclusions  - Left ventricle: The cavity size was normal. There was mild concentric hypertrophy. Systolic function was normal. The estimated ejection fraction was in the range of 50% to 55%. Wall motion was normal; there were no regional wall motion abnormalities. Doppler parameters are consistent with abnormal left ventricular relaxation (grade 1 diastolic dysfunction). - Aortic valve: Transvalvular velocity was within the normal range. There was no stenosis. There was no regurgitation. - Mitral valve: There was no regurgitation. - Right ventricle: The cavity size was normal. Wall thickness was normal. Systolic function was normal. - Tricuspid valve: There was no regurgitation. - Inferior vena cava: The vessel was normal in size. The respirophasic diameter changes were in the normal range (>= 50%), consistent with normal central venous pressure.    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 ECHO nl EF ECOG PS 1-2 but activities significantly limited due to being legally blind HIV/Hep C/Hep B neg Baseline LDH 450 Patient lives by himself and is using [employer] for transportation. Daughter has recently come to stay with him from out of [place] and has been helping him 2) Retinitis pigmentosa causing legal blindness 3) Weight loss and protein calorie malnutrition. 4) multiple scab like skin  lesions ? Due to pruritus and scratching vs skin involvement by lymphoma. On bactrim for treatment of cellulitis. Improved skin lesions with steroids. Plan -labs stable patient okay with proceed with R-CHOP chemotherapy -PET/CT ordered but conuld not be scheduled till 11/14/2015 -port placement -R-CHOP x 6-8 cycles followed by Rituxan maintenance if CR achieved. (considered BR but shall leave this option for 2nd line setting, RV-CAP considered - higher risk of neutropenias and possibly neuropathy. -will hold off on neulasta cycle 1 and shall add this if significant neutropenia noted with this  -started on dexamethasone 4mg  po BID awaiting start of chemotherapy ASAP. -SW consultation -Home health services referral placed. -allopurinol for TLS proprophylaxis -counseled on the need for aggressive hydration even at home   All of the patients and his daughters questions were answered to his  apparent satisfaction. The patient knows to call the clinic with any problems, questions or concerns.  I spent 20 minutes counseling the patient face to face. The total time spent in the appointment was 25 minutes and more than 50% was on counseling and direct patient cares.    Sullivan Lone MD [place] AAHIVMS [facility] Hematology/Oncology Physician [facility]  (Office):       [phone] (Work cell):  [phone] (Fax):           [phone]

## 2015-11-15 ENCOUNTER — Other Ambulatory Visit: Payer: Self-pay | Admitting: *Deleted

## 2015-11-15 ENCOUNTER — Ambulatory Visit (HOSPITAL_BASED_OUTPATIENT_CLINIC_OR_DEPARTMENT_OTHER): Payer: Medicare Other | Admitting: Hematology

## 2015-11-15 ENCOUNTER — Other Ambulatory Visit (HOSPITAL_BASED_OUTPATIENT_CLINIC_OR_DEPARTMENT_OTHER): Payer: Medicare Other

## 2015-11-15 ENCOUNTER — Encounter: Payer: Self-pay | Admitting: Hematology

## 2015-11-15 VITALS — BP 121/63 | HR 68 | Temp 97.4°F | Resp 18 | Ht 69.0 in | Wt 169.8 lb

## 2015-11-15 DIAGNOSIS — L989 Disorder of the skin and subcutaneous tissue, unspecified: Secondary | ICD-10-CM

## 2015-11-15 DIAGNOSIS — H3552 Pigmentary retinal dystrophy: Secondary | ICD-10-CM

## 2015-11-15 DIAGNOSIS — C8315 Mantle cell lymphoma, lymph nodes of inguinal region and lower limb: Secondary | ICD-10-CM | POA: Diagnosis not present

## 2015-11-15 DIAGNOSIS — C8590 Non-Hodgkin lymphoma, unspecified, unspecified site: Secondary | ICD-10-CM

## 2015-11-15 DIAGNOSIS — R634 Abnormal weight loss: Secondary | ICD-10-CM | POA: Diagnosis not present

## 2015-11-15 DIAGNOSIS — E46 Unspecified protein-calorie malnutrition: Secondary | ICD-10-CM | POA: Diagnosis not present

## 2015-11-15 DIAGNOSIS — Z720 Tobacco use: Secondary | ICD-10-CM

## 2015-11-15 DIAGNOSIS — C8318 Mantle cell lymphoma, lymph nodes of multiple sites: Secondary | ICD-10-CM

## 2015-11-15 LAB — COMPREHENSIVE METABOLIC PANEL
ALT: 10 U/L (ref 0–55)
ANION GAP: 10 meq/L (ref 3–11)
AST: 9 U/L (ref 5–34)
Albumin: 3.1 g/dL — ABNORMAL LOW (ref 3.5–5.0)
Alkaline Phosphatase: 87 U/L (ref 40–150)
BUN: 13.9 mg/dL (ref 7.0–26.0)
CHLORIDE: 103 meq/L (ref 98–109)
CO2: 25 meq/L (ref 22–29)
Calcium: 8.9 mg/dL (ref 8.4–10.4)
Creatinine: 0.9 mg/dL (ref 0.7–1.3)
GLUCOSE: 210 mg/dL — AB (ref 70–140)
Potassium: 3.8 mEq/L (ref 3.5–5.1)
SODIUM: 138 meq/L (ref 136–145)
Total Bilirubin: 0.32 mg/dL (ref 0.20–1.20)
Total Protein: 6.5 g/dL (ref 6.4–8.3)

## 2015-11-15 LAB — CBC & DIFF AND RETIC
BASO%: 0.3 % (ref 0.0–2.0)
Basophils Absolute: 0 10*3/uL (ref 0.0–0.1)
EOS%: 1.4 % (ref 0.0–7.0)
Eosinophils Absolute: 0.1 10*3/uL (ref 0.0–0.5)
HCT: 38 % — ABNORMAL LOW (ref 38.4–49.9)
HGB: 11.9 g/dL — ABNORMAL LOW (ref 13.0–17.1)
IMMATURE RETIC FRACT: 2 % — AB (ref 3.00–10.60)
LYMPH#: 0.4 10*3/uL — AB (ref 0.9–3.3)
LYMPH%: 12.1 % — ABNORMAL LOW (ref 14.0–49.0)
MCH: 26.2 pg — ABNORMAL LOW (ref 27.2–33.4)
MCHC: 31.3 g/dL — AB (ref 32.0–36.0)
MCV: 83.7 fL (ref 79.3–98.0)
MONO#: 0.2 10*3/uL (ref 0.1–0.9)
MONO%: 5.5 % (ref 0.0–14.0)
NEUT%: 80.7 % — AB (ref 39.0–75.0)
NEUTROS ABS: 2.8 10*3/uL (ref 1.5–6.5)
PLATELETS: 214 10*3/uL (ref 140–400)
RBC: 4.54 10*6/uL (ref 4.20–5.82)
RDW: 15 % — AB (ref 11.0–14.6)
RETIC CT ABS: 11.35 10*3/uL — AB (ref 34.80–93.90)
Retic %: <0.38 % — ABNORMAL LOW (ref 0.5–1.6)
WBC: 3.5 10*3/uL — AB (ref 4.0–10.3)

## 2015-11-15 MED ORDER — LIDOCAINE-PRILOCAINE 2.5-2.5 % EX CREA: 1.0000 "application " | TOPICAL_CREAM | CUTANEOUS | Status: DC | PRN

## 2015-11-18 ENCOUNTER — Encounter: Payer: Self-pay | Admitting: Hematology

## 2015-11-18 NOTE — Progress Notes (Signed)
Pt is approved with [employer] for $5,000 from 11/09/15 to 11/07/16 to use for ins premiums and/or eligible pharmaceutical copays prescribed in relation to his Dx. Emailed a copy of approval letter and POE to Raquel Sarna in the [employer].

## 2015-11-20 NOTE — Progress Notes (Signed)
Kristopher Johnson    HEMATOLOGY/ONCOLOGY CLINIC NOTE  Date of Service: .11/15/2015  Patient Care Team: No Pcp Per Patient as PCP - General (General Practice)  CHIEF COMPLAINTS/PURPOSE OF CONSULTATION:   followup for New Diagnosed Mantle Cell lymphoma  DIAGNOSIS  1) Stage IV Mantle cell lymphoma  TREATMENT  R-CHOP 6-8 cycles planned followed by Rituxan maintenance. Patient chooses not to pursue AutoHSCT based strategies which is quite reasonable considering his functional challenges with legal blindness and limited social support.   HISTORY OF PRESENTING ILLNESS: plz see clinic note for details on initial presentation  Interval History  Kristopher Johnson is here for followup alongwith his daughter for a toxicity check after first first cycle of R-CHOP. He notes no acute new symptoms. No fevers/chills. Notes that his abdominal pressure has reduced and he is eating better. His skin lesions are responding very well. No other acute new symptoms. He did finally get his PET/CT yesterday that showed high volume extensive lymphoma involvement. We discussed this in details.   MEDICAL HISTORY:  Past Medical History  Diagnosis Date  . Retinitis pigmentosa     Patient notes that he has been declared legally blind since 1983 and is on Social Security disability  . Abscess of arm, right 10/27/2015  . Lymphoma of lymph nodes of multiple sites ([place]) 10/27/2015  . Shortness of breath 10/27/2015    SURGICAL HISTORY: Past Surgical History  Procedure Laterality Date  . Cataract surgery      Patient notes she has had bilateral Surgery in 1998 and 99  . Tonsillectomy      about 60years old    SOCIAL HISTORY: Social History   Social History  . Marital Status: Widowed    Spouse Name: N/A  . Number of Children: N/A  . Years of Education: N/A   Occupational History  . Not on file.   Social History Main Topics  . Smoking status: Current Every Day Smoker -- 1.00 packs/day for 33 years  . Smokeless tobacco:  Never Used  . Alcohol Use: No  . Drug Use: No  . Sexual Activity: Not on file     Comment: Disabled, retinis pigmentosa legally blind, 4children MPOA Lynnn(cousin)   Other Topics Concern  . Not on file   Social History Narrative    FAMILY HISTORY: History reviewed. No pertinent family history.  ALLERGIES:  has No Known Allergies.  MEDICATIONS:  Current Outpatient Prescriptions  Medication Sig Dispense Refill  . allopurinol (ZYLOPRIM) 300 MG tablet Take 1 tablet (300 mg total) by mouth daily. (Patient not taking: Reported on 11/07/2015) 30 tablet 0  . dexamethasone (DECADRON) 4 MG tablet Take 1 tablet (4 mg total) by mouth 2 (two) times daily with breakfast and lunch. 60 tablet 0  . lidocaine-prilocaine (EMLA) cream Apply 1 application topically as needed. 30 g 6  . ondansetron (ZOFRAN) 8 MG tablet Take 1 tablet (8 mg total) by mouth 2 (two) times daily as needed for refractory nausea / vomiting. (Patient not taking: Reported on 11/07/2015) 30 tablet 1  . prochlorperazine (COMPAZINE) 10 MG tablet Take 1 tablet (10 mg total) by mouth every 6 (six) hours as needed (Nausea or vomiting). (Patient not taking: Reported on 11/07/2015) 30 tablet 6   No current facility-administered medications for this visit.    REVIEW OF SYSTEMS:    10 Point review of Systems was done is negative except as noted above.  PHYSICAL EXAMINATION: ECOG PERFORMANCE STATUS: 2 - Symptomatic, <50% confined to bed  . Filed Vitals:  11/15/15 1008  BP: 121/63  Pulse: 68  Temp: 97.4 F (36.3 C)  Resp: 18   Filed Weights   11/15/15 1008  Weight: 169 lb 12.8 oz (77.021 kg)   .Body mass index is 25.06 kg/(m^2).  GENERAL:alert, in no acute distress and comfortable SKIN: skin color, texture, turgor are normal, no rashes or significant lesions EYES: normal, conjunctiva are pink and non-injected, sclera clear. Poor visual acuity - some directional light perception but difficulty with finger  counting. OROPHARYNX:no exudate, no erythema and lips, buccal mucosa, and tongue normal  NECK: supple, no JVD, thyroid normal size, non-tender, without nodularity LYMPH:  no palpable lymphadenopathy in the cervical, axillary or inguinal LUNGS: clear to auscultation with normal respiratory effort HEART: regular rate & rhythm,  no murmurs and no lower extremity edema ABDOMEN: abdomen soft, non-tender, normoactive bowel sounds  Musculoskeletal: no cyanosis of digits and no clubbing  PSYCH: alert & oriented x 3 with fluent speech NEURO: no focal motor/sensory deficits  LABORATORY DATA:  I have reviewed the data as listed  . CBC Latest Ref Rng 11/15/2015 11/10/2015 11/07/2015  WBC 4.0 - 10.3 10e3/uL 3.5(L) 10.8(H) 14.1(H)  Hemoglobin 13.0 - 17.1 g/dL 11.9(L) 12.5(L) 12.8(L)  Hematocrit 38.4 - 49.9 % 38.0(L) 38.1(L) 40.1  Platelets 140 - 400 10e3/uL 214 257 256    . CMP Latest Ref Rng 11/15/2015 11/07/2015 11/01/2015  Glucose 70 - 140 mg/dl 210(H) 109 113  BUN 7.0 - 26.0 mg/dL 13.9 19.5 15.2  Creatinine 0.7 - 1.3 mg/dL 0.9 1.0 1.2  Sodium 136 - 145 mEq/L 138 139 134(L)  Potassium 3.5 - 5.1 mEq/L 3.8 4.1 4.9  Chloride 101 - 111 mmol/L - - -  CO2 22 - 29 mEq/L 25 26 25   Calcium 8.4 - 10.4 mg/dL 8.9 9.7 9.2  Total Protein 6.4 - 8.3 g/dL 6.5 7.1 6.8  Total Bilirubin 0.20 - 1.20 mg/dL 0.32 <0.30 0.35  Alkaline Phos 40 - 150 U/L 87 83 91  AST 5 - 34 U/L 9 16 19   ALT 0 - 55 U/L 10 17 12    Component     Latest Ref Rng 10/25/2015 11/01/2015  HIV     Non Reactive Non Reactive   HCV Ab     0.0 - 0.9 s/co ratio 0.2   Hepatitis B Surface Ag     Negative Negative   Hep B S Ab      Non Reactive   Comment      Comment   LDH     125 - 245 U/L  441 (H)   . Lab Results  Component Value Date   LDH 450* 11/07/2015    Uric acid 7.1      RADIOGRAPHIC STUDIES: I have personally reviewed the radiological images as listed and agreed with the findings in the report. Ct Soft Tissue Neck W  Contrast  10/24/2015  CLINICAL DATA:  Concerning for lymphoma. Abdominal pain, urinary retention. Imaging today demonstrates possible lymphoma. EXAM: CT NECK AND CHEST WITHOUT CONTRAST COMPARISON:  None. FINDINGS: CT NECK FINDINGS (limited assessment without contrast) Pharynx and larynx: Normal. Salivary glands: Normal. Thyroid: Probable lymphadenopathy versus thyromegaly, difficult to discern without contrast. Lymph nodes: Lymphadenopathy along the cervical changing, including 14 mm short access RIGHT level IIa lymph node, multiple presumed lymph nodes level 3 and 4. Probable LEFT supraclavicular lymphadenopathy though, difficult to differentiate from vascular structures. Vascular: Limited assessment. Limited intracranial: Normal. Visualized orbits: Abnormal soft tissue within the retrobulbar fat at the optic nerve insertion,  LEFT greater than RIGHT. The ocular globes are intact, status post bilateral ocular lens implants. Extraocular muscles are unremarkable. Preservation of orbital fat. Mastoids and visualized paranasal sinuses: Well aerated. Skeleton: No destructive bony lesions. Mild to moderate degenerative change of the cervical spine. CT CHEST FINDINGS Mediastinum/Lymph Nodes: Mediastinal lymphadenopathy, including LEFT peritracheal at least 22 mm nodal conglomeration. Bilateral presumed hilar lymphadenopathy. Internal mammary chain lymphadenopathy on the RIGHT measuring up to 14 mm. Bulky bilateral axillary lymphadenopathy measuring up to 28 mm short access. 19 mm short access cardio phrenic angle lymph node. Heart size normal. No pericardial effusions. Lungs/Pleura: Innumerable, diffuse subcentimeter centrilobular nodules and tree-in-bud infiltrates. Small bilateral pleural effusions with compressive atelectasis. Tracheobronchial tree is patent and midline. No pneumothorax. Musculoskeletal: No chest wall mass or suspicious bone lesions identified. IMPRESSION: CT NECK: Limited non contrast assessment.  Extensive lymphadenopathy compatible with lymphoma, difficult to differentiate from underlying structures due to lack of contrast. Patent airway. Bilateral orbital retrobulbar masses most consistent with lymphoma. This would be better characterized on MRI of the orbits with contrast on a nonemergent basis. CT CHEST: Limited noncontrast assessment. Extensive lymphadenopathy consistent with lymphoma. Diffuse centrilobular nodules and tree-in-bud infiltrates most consistent with lymphangitic spread of lymphoma though, and infection could have a similar appearance. Small bilateral pleural effusions are likely malignant. Electronically Signed   By: Elon Alas M.D.   On: 10/24/2015 22:17   Ct Chest W Contrast  10/24/2015  CLINICAL DATA:  Concerning for lymphoma. Abdominal pain, urinary retention. Imaging today demonstrates possible lymphoma. EXAM: CT NECK AND CHEST WITHOUT CONTRAST COMPARISON:  None. FINDINGS: CT NECK FINDINGS (limited assessment without contrast) Pharynx and larynx: Normal. Salivary glands: Normal. Thyroid: Probable lymphadenopathy versus thyromegaly, difficult to discern without contrast. Lymph nodes: Lymphadenopathy along the cervical changing, including 14 mm short access RIGHT level IIa lymph node, multiple presumed lymph nodes level 3 and 4. Probable LEFT supraclavicular lymphadenopathy though, difficult to differentiate from vascular structures. Vascular: Limited assessment. Limited intracranial: Normal. Visualized orbits: Abnormal soft tissue within the retrobulbar fat at the optic nerve insertion, LEFT greater than RIGHT. The ocular globes are intact, status post bilateral ocular lens implants. Extraocular muscles are unremarkable. Preservation of orbital fat. Mastoids and visualized paranasal sinuses: Well aerated. Skeleton: No destructive bony lesions. Mild to moderate degenerative change of the cervical spine. CT CHEST FINDINGS Mediastinum/Lymph Nodes: Mediastinal lymphadenopathy,  including LEFT peritracheal at least 22 mm nodal conglomeration. Bilateral presumed hilar lymphadenopathy. Internal mammary chain lymphadenopathy on the RIGHT measuring up to 14 mm. Bulky bilateral axillary lymphadenopathy measuring up to 28 mm short access. 19 mm short access cardio phrenic angle lymph node. Heart size normal. No pericardial effusions. Lungs/Pleura: Innumerable, diffuse subcentimeter centrilobular nodules and tree-in-bud infiltrates. Small bilateral pleural effusions with compressive atelectasis. Tracheobronchial tree is patent and midline. No pneumothorax. Musculoskeletal: No chest wall mass or suspicious bone lesions identified. IMPRESSION: CT NECK: Limited non contrast assessment. Extensive lymphadenopathy compatible with lymphoma, difficult to differentiate from underlying structures due to lack of contrast. Patent airway. Bilateral orbital retrobulbar masses most consistent with lymphoma. This would be better characterized on MRI of the orbits with contrast on a nonemergent basis. CT CHEST: Limited noncontrast assessment. Extensive lymphadenopathy consistent with lymphoma. Diffuse centrilobular nodules and tree-in-bud infiltrates most consistent with lymphangitic spread of lymphoma though, and infection could have a similar appearance. Small bilateral pleural effusions are likely malignant. Electronically Signed   By: Elon Alas M.D.   On: 10/24/2015 22:17   Ct Abdomen Pelvis W Contrast  10/24/2015  CLINICAL DATA:  Upper abdominal pain and bloating for 2 months. EXAM: CT ABDOMEN AND PELVIS WITH CONTRAST TECHNIQUE: Multidetector CT imaging of the abdomen and pelvis was performed using the standard protocol following bolus administration of intravenous contrast. CONTRAST:  178mL OMNIPAQUE IOHEXOL 300 MG/ML  SOLN COMPARISON:  None. FINDINGS: Lower chest: Nodularity along the bronchovascular bundles in both lower lobes could be lymphoma in his involvement of the lungs. There are  bilateral pleural effusions and overlying atelectasis. There is also a 17 mm epicardial lymph node on image 9. No pericardial effusion. Small pleural nodules are also noted. Retrocrural adenopathy is also noted. Hepatobiliary: Simple appearing hepatic cyst in the left lobe. No worrisome hepatic lesions or intrahepatic biliary dilatation. The gallbladder is unremarkable. No common bile duct dilatation. Pancreas: No mass, inflammation or ductal dilatation. Spleen: Normal size.  No focal lesions. Adrenals/Urinary Tract: The adrenal glands are grossly normal. There is a thick rim soft tissue thickening along the posterior aspect of the left kidney which could be lymphomatous involvement. A left renal calculus is also noted. There is also a a thick rim of soft tissue density along the left renal pelvis extending down along the inferior margin of the kidney which is likely lymphoma. This measures a maximum of 3.5 cm on image number 41. Stomach/Bowel: The stomach is markedly thickened and somewhat nodular and irregular. Findings suspicious for gastric lymphoma. The duodenum, small bowel and colon are grossly normal. No obvious inflammatory changes or mass lesions. No obstructive findings. The terminal ileum is normal. The appendix is normal. Vascular/Lymphatic: Bulky mesenteric and retroperitoneal lymphadenopathy. Large upper abdominal mesenteric mass measures 12 x 7.5 cm on image number 22. There is also bulky retroperitoneal adenopathy with an index node between the aorta and left kidney measuring 30 x 24 mm on image number 32. There is also bilateral pelvic and inguinal lymphadenopathy. An index right external iliac lymph node on image number 69 measures a maximum of 21 mm short axis. Right inguinal lymph node measures 27 x 16 mm on image number 87. The aorta is normal in caliber. The branch vessels are patent. The major venous structures are patent. Mild compression of the IVC by the bulky adenopathy. Other: The  bladder is normal. The prostate gland is mildly enlarged with median lobe hypertrophy impressing on the base of the bladder. The seminal vesicles are normal. No free pelvic fluid collections. Bilateral inguinal hernias containing small bowel loops. Musculoskeletal: No obvious changes of osseous lymphoma. IMPRESSION: 1. Constellation of findings most consistent with lymphoma. 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 2. Suspect lymphomatous involvement of the lungs. There also bilateral pleural effusions and overlying atelectasis. No obvious involvement of the liver or spleen. Bilateral inguinal hernias containing small bowel loops. Electronically Signed   By: Marijo Sanes M.D.   On: 10/24/2015 19:15   Nm Pet Image Initial (pi) Skull Base To Thigh  11/14/2015  CLINICAL DATA:  Initial treatment strategy for recently diagnosed non-Hodgkin's lymphoma (favor mantle cell lymphoma). EXAM: NUCLEAR MEDICINE PET SKULL BASE TO THIGH TECHNIQUE: 8.5 mCi F-18 FDG was injected intravenously. Full-ring PET imaging was performed from the skull base to thigh after the radiotracer. CT data was obtained and used for attenuation correction and anatomic localization. FASTING BLOOD GLUCOSE:  Value: 99 mg/dl COMPARISON:  10/24/2015 CT neck, chest, abdomen and pelvis. FINDINGS: NECK There is hypermetabolic right level 2 cervical lymphadenopathy, with a representative  1.0 cm mildly enlarged right level 2 node with max SUV 6.5 (series 4/ image 31). There is bilateral hypermetabolic level 4 cervical lymphadenopathy, with a representative 1.2 cm right level 4 hypermetabolic mildly enlarged node (series 4/ image 49) with max SUV 12.1. There is diffuse mild enlargement and uniform hypermetabolism of the thyroid gland, max SUV 8.2, without discrete thyroid nodule. CHEST Right internal  jugular MediPort terminates in the lower third of the superior vena cava. Small layering bilateral pleural effusions, left greater than right. There is hypermetabolic bilateral axillary lymphadenopathy, with a representative 1.1 cm mildly enlarged hypermetabolic left axillary node (series 4/ image 57) with max SUV 13.4. There is hypermetabolic prevascular, anterior mediastinal, bilateral paratracheal and subcarinal mediastinal lymphadenopathy, with a representative mildly enlarged 1.0 cm hypermetabolic prevascular node (series 4/ image 63) with max SUV 10.2. There is hypermetabolic bilateral internal mammary lymphadenopathy, with a representative mildly enlarged 1.1 cm right internal mammary hypermetabolic node (series 4/ image 76) with max SUV 7.2. There is diffuse peribronchovascular nodularity throughout both lungs, decreased since 10/24/2015, below PET resolution, favor lymphangitic spread of lymphoma. There are hypermetabolic right paraspinal masses in the lower thoracic spine, with a representative 2.9 x 2.2 cm right paraspinal hypermetabolic mass at [id number] (series 4/image 117) with max SUV 19.9. ABDOMEN/PELVIS There is extensive hypermetabolic confluent bulky upper retroperitoneal lymphadenopathy in the gastrohepatic ligament, para celiac, peripancreatic, porta hepatis and portacaval regions measuring up to 4.0 cm short axis (series 4/image 120) with max SUV 20.1. There is hypermetabolic aortocaval and left para-aortic lymphadenopathy, with a representative hypermetabolic 2.3 cm left para-aortic node (series 4/image 130) with max SUV 13.5. There is hypermetabolic central mesenteric lymphadenopathy, with a representative mildly enlarged hypermetabolic left mesenteric 1.2 cm node (series 4/image 150) with max SUV 20.1. There is irregular hypermetabolic gastric wall thickening throughout the proximal stomach, most prominent along the greater curvature, with max SUV 9.3. There is bilateral iliac and bilateral  inguinal lymphadenopathy, with a representative mildly enlarged hypermetabolic 1.0 cm right external iliac node (series 4/image 179) with max SUV 9.9 and mildly enlarged hypermetabolic 1.2 cm left inguinal node (series 4/image 204) with max SUV 6.2. No abnormal hypermetabolic activity within the liver, pancreas, adrenal glands, or spleen. Simple 2.9 cm lateral segment left liver lobe cyst. Bilateral moderate inguinal hernias, both containing small bowel loops, with no small bowel dilatation, wall thickening or pneumatosis. SKELETON Hypermetabolic right rib osseous foci, with a representative right lateral rib hypermetabolic focus with max SUV 6.2. IMPRESSION: 1. Extensive hypermetabolic lymphadenopathy in the bilateral neck, bilateral axilla, mediastinum, retroperitoneum, mesentery and bilateral pelvis, in keeping with widespread lymphoma. 2. Hypermetabolic proximal gastric lymphoma. 3. Hypermetabolic right paraspinal lymphoma in the lower thorax. Hypermetabolic lymphoma in the right ribs. 4. Diffuse peribronchovascular nodularity throughout both lungs, below PET resolution, favor lymphangitic spread of lymphoma. Small bilateral pleural effusions. 5. Diffuse mild enlargement and uniform hypermetabolism in the thyroid gland, which is nonspecific and could represent lymphomatous involvement of the thyroid gland versus hyperthyroidism due to other causes. Recommend correlation with serum thyroid function tests. 6. Moderate bilateral inguinal hernias containing small bowel loops with no evidence of small-bowel obstruction or strangulation. Electronically Signed   By: Ilona Sorrel M.D.   On: 11/14/2015 14:27   US Biopsy  10/25/2015  CLINICAL DATA:  60 year old male with new diagnosis of profound adenopathy concerning for advanced lymphoma. He presents for ultrasound-guided core biopsy to obtain tissue diagnosis. EXAM: ULTRASOUND BIOPSY LYMPH NODE Date: 10/25/2015 PROCEDURE: 1. Ultrasound-guided core biopsy of right  superficial inguinal lymph nodes Interventional Radiologist:  Criselda Peaches, MD ANESTHESIA/SEDATION: 50 mcg fentanyl administered intravenously. MEDICATIONS: None additional TECHNIQUE: Informed consent was obtained from the patient following explanation of the procedure, risks, benefits and alternatives. The patient understands, agrees and consents for the procedure. All questions were addressed. A time out was performed. The right groin was interrogated with ultrasound. Multiple enlarged superficial inguinal lymph nodes are identified. The nodes are superficial to the common femoral artery and vein. An appropriate skin entry site was selected and marked. The region was sterilely prepped and draped in standard fashion using chlorhexidine skin prep. Under real-time sonographic guidance, multiple 16 gauge core biopsies were then coaxially obtained. Needle placement was confirmed on all biopsy passes with real-time sonography. Biopsy specimens were placed in saline and delivered to pathology for further analysis. Post biopsy ultrasound imaging demonstrates no active bleeding or hematoma. The patient tolerated the procedure well. COMPLICATIONS: None. Estimated blood loss: 0 IMPRESSION: Technically successful ultrasound-guided core biopsy of superficial right inguinal adenopathy. Signed, Criselda Peaches, MD [facility] Radiology Electronically Signed   By: Jacqulynn Cadet M.D.   On: 10/25/2015 14:25   Ir Fluoro Guide Cv Line Right  11/10/2015  INDICATION: 60 year old male with a history of mantle cell lymphoma in need of durable central venous access for chemotherapy. EXAM: IMPLANTED PORT A CATH PLACEMENT WITH ULTRASOUND AND FLUOROSCOPIC GUIDANCE MEDICATIONS: 2 g Ancef; The antibiotic was administered within an appropriate time interval prior to skin puncture. ANESTHESIA/SEDATION: Versed 2 mg IV; Fentanyl 75 mcg IV; Moderate Sedation Time:  19 The patient was  continuously monitored during the procedure by the interventional radiology nurse under my direct supervision. FLUOROSCOPY TIME:  0 minutes, 18 seconds (2 mGy) COMPLICATIONS: None immediate. Estimated blood loss: None PROCEDURE: The right neck and chest was prepped with chlorhexidine, and draped in the usual sterile fashion using maximum barrier technique (cap and mask, sterile gown, sterile gloves, large sterile sheet, hand hygiene and cutaneous antiseptic). Antibiotic prophylaxis was provided with 2g Ancef administered IV one hour prior to skin incision. Local anesthesia was attained by infiltration with 1% lidocaine with epinephrine. Ultrasound demonstrated patency of the right internal jugular vein, and this was documented with an image. Under real-time ultrasound guidance, this vein was accessed with a 21 gauge micropuncture needle and image documentation was performed. A small dermatotomy was made at the access site with an 11 scalpel. A 0.018" wire was advanced into the SVC and the access needle exchanged for a 26F micropuncture vascular sheath. The 0.018" wire was then removed and a 0.035" wire advanced into the IVC. An appropriate location for the subcutaneous reservoir was selected below the clavicle and an incision was made through the skin and underlying soft tissues. The subcutaneous tissues were then dissected using a combination of blunt and sharp surgical technique and a pocket was formed. A single lumen power injectable portacatheter was then tunneled through the subcutaneous tissues from the pocket to the dermatotomy and the port reservoir placed within the subcutaneous pocket. The venous access site was then serially dilated and a peel away vascular sheath placed over the wire. The wire was removed and the port catheter advanced into position under fluoroscopic guidance. The catheter tip is positioned in the upper right atrium. This was documented with a spot image. The portacatheter was then  tested and found to flush and aspirate well. The port was flushed with saline followed by 100 units/mL heparinized saline. The pocket was then closed  in two layers using first subdermal inverted interrupted absorbable sutures followed by a running subcuticular suture. The epidermis was then sealed with Dermabond. The dermatotomy at the venous access site was also closed with a single inverted subdermal suture and the epidermis sealed with Dermabond. IMPRESSION: Successful placement of a right IJ approach Power Port with ultrasound and fluoroscopic guidance. The catheter is ready for use. Signed, Criselda Peaches, MD [facility] Radiology Electronically Signed   By: Jacqulynn Cadet M.D.   On: 11/10/2015 13:10   Ir US Guide Vasc Access Right  11/10/2015  INDICATION: 60 year old male with a history of mantle cell lymphoma in need of durable central venous access for chemotherapy. EXAM: IMPLANTED PORT A CATH PLACEMENT WITH ULTRASOUND AND FLUOROSCOPIC GUIDANCE MEDICATIONS: 2 g Ancef; The antibiotic was administered within an appropriate time interval prior to skin puncture. ANESTHESIA/SEDATION: Versed 2 mg IV; Fentanyl 75 mcg IV; Moderate Sedation Time:  19 The patient was continuously monitored during the procedure by the interventional radiology nurse under my direct supervision. FLUOROSCOPY TIME:  0 minutes, 18 seconds (2 mGy) COMPLICATIONS: None immediate. Estimated blood loss: None PROCEDURE: The right neck and chest was prepped with chlorhexidine, and draped in the usual sterile fashion using maximum barrier technique (cap and mask, sterile gown, sterile gloves, large sterile sheet, hand hygiene and cutaneous antiseptic). Antibiotic prophylaxis was provided with 2g Ancef administered IV one hour prior to skin incision. Local anesthesia was attained by infiltration with 1% lidocaine with epinephrine. Ultrasound demonstrated patency of the right internal jugular  vein, and this was documented with an image. Under real-time ultrasound guidance, this vein was accessed with a 21 gauge micropuncture needle and image documentation was performed. A small dermatotomy was made at the access site with an 11 scalpel. A 0.018" wire was advanced into the SVC and the access needle exchanged for a 56F micropuncture vascular sheath. The 0.018" wire was then removed and a 0.035" wire advanced into the IVC. An appropriate location for the subcutaneous reservoir was selected below the clavicle and an incision was made through the skin and underlying soft tissues. The subcutaneous tissues were then dissected using a combination of blunt and sharp surgical technique and a pocket was formed. A single lumen power injectable portacatheter was then tunneled through the subcutaneous tissues from the pocket to the dermatotomy and the port reservoir placed within the subcutaneous pocket. The venous access site was then serially dilated and a peel away vascular sheath placed over the wire. The wire was removed and the port catheter advanced into position under fluoroscopic guidance. The catheter tip is positioned in the upper right atrium. This was documented with a spot image. The portacatheter was then tested and found to flush and aspirate well. The port was flushed with saline followed by 100 units/mL heparinized saline. The pocket was then closed in two layers using first subdermal inverted interrupted absorbable sutures followed by a running subcuticular suture. The epidermis was then sealed with Dermabond. The dermatotomy at the venous access site was also closed with a single inverted subdermal suture and the epidermis sealed with Dermabond. IMPRESSION: Successful placement of a right IJ approach Power Port with ultrasound and fluoroscopic guidance. The catheter is ready for use. Signed, Criselda Peaches, MD [facility] Electronically  Signed   By: Jacqulynn Cadet M.D.   On: 11/10/2015 13:10    ECHO: 11/01/2015: Study Conclusions  - Left ventricle: The cavity size was normal.  There was mild concentric hypertrophy. Systolic function was normal. The estimated ejection fraction was in the range of 50% to 55%. Wall motion was normal; there were no regional wall motion abnormalities. Doppler parameters are consistent with abnormal left ventricular relaxation (grade 1 diastolic dysfunction). - Aortic valve: Transvalvular velocity was within the normal range. There was no stenosis. There was no regurgitation. - Mitral valve: There was no regurgitation. - Right ventricle: The cavity size was normal. Wall thickness was normal. Systolic function was normal. - Tricuspid valve: There was no regurgitation. - Inferior vena cava: The vessel was normal in size. The respirophasic diameter changes were in the normal range (>= 50%), consistent with normal central venous pressure.Study Conclusions  - Left ventricle: The cavity size was normal. There was mild concentric hypertrophy. Systolic function was normal. The estimated ejection fraction was in the range of 50% to 55%. Wall motion was normal; there were no regional wall motion abnormalities. Doppler parameters are consistent with abnormal left ventricular relaxation (grade 1 diastolic dysfunction). - Aortic valve: Transvalvular velocity was within the normal range. There was no stenosis. There was no regurgitation. - Mitral valve: There was no regurgitation. - Right ventricle: The cavity size was normal. Wall thickness was normal. Systolic function was normal. - Tricuspid valve: There was no regurgitation. - Inferior vena cava: The vessel was normal in size. The respirophasic diameter changes were in the normal range (>= 50%), consistent with normal central venous pressure.    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 Patient lives by himself and is using [employer] for transportation. Daughter has recently come to stay with him from out of state and has been helping him 2) Retinitis pigmentosa causing legal blindness 3) Weight loss and protein calorie malnutrition. 4) multiple scab like skin lesions ? Due to pruritus and scratching with likely skin involvement by lymphoma. Skin lesions are resolving rapidly with just his first cycle of R-CHOP Plan -no prohibitive toxicities. Patient appears to have tolerated the first cycle of R-CHOP well thus far. -blood counts stable. Not neutropenic at this time. We are watching for this to determine if he might need G-CSF with future cycles. -planning for R-CHOP x 6-8 cycles followed by Rituxan maintenance if CR achieved.  -will hold off on neulasta cycle 1 and shall add this if significant neutropenia noted with this or as bone marrow reserves wane with future cycles of treatment. -patient notes that he could not tolerate the prednisone and wants to use the dexamethasone instead - which is okl -SW consultation -Home health services referral placed. -continue allopurinol for TLS proprophylaxis atleast for cycle 1 -recounselled on need for good po food intake and aggressive hydration. -monitor for concerns of UGU bleeding with gastric involvement. -Patient needs to establish a PCP  All of the patients and his daughters questions were answered to his  apparent satisfaction. The patient knows to call the clinic with any problems, questions or concerns.  I spent 20 minutes counseling the patient face to face. The total time spent in the appointment was 25 minutes and more than 50% was on counseling  and direct patient cares.    Sullivan Lone MD MS AAHIVMS [facility] Hematology/Oncology Physician [facility]  (Office):       [phone] (Work cell):  [phone] (Fax):           [phone]

## 2015-11-28 ENCOUNTER — Other Ambulatory Visit: Payer: Self-pay | Admitting: Hematology

## 2015-11-28 ENCOUNTER — Other Ambulatory Visit: Payer: Self-pay | Admitting: *Deleted

## 2015-11-28 ENCOUNTER — Encounter: Payer: Self-pay | Admitting: Nurse Practitioner

## 2015-11-28 ENCOUNTER — Other Ambulatory Visit (HOSPITAL_BASED_OUTPATIENT_CLINIC_OR_DEPARTMENT_OTHER): Payer: Medicare Other

## 2015-11-28 ENCOUNTER — Ambulatory Visit (HOSPITAL_BASED_OUTPATIENT_CLINIC_OR_DEPARTMENT_OTHER): Payer: Medicare Other

## 2015-11-28 ENCOUNTER — Ambulatory Visit: Payer: Medicare Other | Admitting: Nutrition

## 2015-11-28 ENCOUNTER — Telehealth: Payer: Self-pay | Admitting: *Deleted

## 2015-11-28 ENCOUNTER — Ambulatory Visit (HOSPITAL_BASED_OUTPATIENT_CLINIC_OR_DEPARTMENT_OTHER): Payer: Medicare Other | Admitting: Nurse Practitioner

## 2015-11-28 VITALS — BP 103/59 | HR 89 | Temp 98.5°F | Resp 18

## 2015-11-28 DIAGNOSIS — Z5111 Encounter for antineoplastic chemotherapy: Secondary | ICD-10-CM | POA: Diagnosis not present

## 2015-11-28 DIAGNOSIS — Z5112 Encounter for antineoplastic immunotherapy: Secondary | ICD-10-CM | POA: Diagnosis present

## 2015-11-28 DIAGNOSIS — C8318 Mantle cell lymphoma, lymph nodes of multiple sites: Secondary | ICD-10-CM

## 2015-11-28 DIAGNOSIS — R21 Rash and other nonspecific skin eruption: Secondary | ICD-10-CM

## 2015-11-28 DIAGNOSIS — C8598 Non-Hodgkin lymphoma, unspecified, lymph nodes of multiple sites: Secondary | ICD-10-CM

## 2015-11-28 LAB — COMPREHENSIVE METABOLIC PANEL
ALBUMIN: 3.1 g/dL — AB (ref 3.5–5.0)
ALK PHOS: 77 U/L (ref 40–150)
ALT: 15 U/L (ref 0–55)
AST: 12 U/L (ref 5–34)
Anion Gap: 7 mEq/L (ref 3–11)
BUN: 12.1 mg/dL (ref 7.0–26.0)
CO2: 27 meq/L (ref 22–29)
CREATININE: 1 mg/dL (ref 0.7–1.3)
Calcium: 9.3 mg/dL (ref 8.4–10.4)
Chloride: 103 mEq/L (ref 98–109)
EGFR: >90 mL/min/{1.73_m2} (ref >90)
GLUCOSE: 180 mg/dL — AB (ref 70–140)
Potassium: 4.6 mEq/L (ref 3.5–5.1)
SODIUM: 138 meq/L (ref 136–145)
TOTAL PROTEIN: 6.6 g/dL (ref 6.4–8.3)

## 2015-11-28 LAB — CBC & DIFF AND RETIC
BASO%: 0.3 % (ref 0.0–2.0)
Basophils Absolute: 0 10*3/uL (ref 0.0–0.1)
EOS ABS: 0.2 10*3/uL (ref 0.0–0.5)
EOS%: 2.1 % (ref 0.0–7.0)
HCT: 39.6 % (ref 38.4–49.9)
HEMOGLOBIN: 12.2 g/dL — AB (ref 13.0–17.1)
IMMATURE RETIC FRACT: 9.7 % (ref 3.00–10.60)
LYMPH#: 1.6 10*3/uL (ref 0.9–3.3)
LYMPH%: 13.7 % — AB (ref 14.0–49.0)
MCH: 26.4 pg — ABNORMAL LOW (ref 27.2–33.4)
MCHC: 30.8 g/dL — ABNORMAL LOW (ref 32.0–36.0)
MCV: 85.7 fL (ref 79.3–98.0)
MONO#: 0.4 10*3/uL (ref 0.1–0.9)
MONO%: 3.2 % (ref 0.0–14.0)
NEUT%: 80.7 % — ABNORMAL HIGH (ref 39.0–75.0)
NEUTROS ABS: 9.2 10*3/uL — AB (ref 1.5–6.5)
Platelets: 195 10*3/uL (ref 140–400)
RBC: 4.62 10*6/uL (ref 4.20–5.82)
RDW: 16.7 % — ABNORMAL HIGH (ref 11.0–14.6)
RETIC CT ABS: 36.96 10*3/uL (ref 34.80–93.90)
Retic %: 0.8 % (ref 0.80–1.80)
WBC: 11.4 10*3/uL — AB (ref 4.0–10.3)

## 2015-11-28 MED ORDER — SODIUM CHLORIDE 0.9% FLUSH
10.0000 mL | INTRAVENOUS | Status: DC | PRN
  Administered 2015-11-28: 10 mL
  Filled 2015-11-28: qty 10

## 2015-11-28 MED ORDER — SODIUM CHLORIDE 0.9 % IV SOLN: Freq: Once | INTRAVENOUS | Status: DC

## 2015-11-28 MED ORDER — CYCLOPHOSPHAMIDE CHEMO INJECTION 1 GM
750.0000 mg/m2 | Freq: Once | INTRAMUSCULAR | Status: AC
  Administered 2015-11-28: 1500 mg via INTRAVENOUS
  Filled 2015-11-28: qty 75

## 2015-11-28 MED ORDER — SODIUM CHLORIDE 0.9 % IV SOLN
Freq: Once | INTRAVENOUS | Status: AC
  Administered 2015-11-28: 11:00:00 via INTRAVENOUS

## 2015-11-28 MED ORDER — HEPARIN SOD (PORK) LOCK FLUSH 100 UNIT/ML IV SOLN
500.0000 [IU] | Freq: Once | INTRAVENOUS | Status: AC | PRN
  Administered 2015-11-28: 500 [IU]
  Filled 2015-11-28: qty 5

## 2015-11-28 MED ORDER — VINCRISTINE SULFATE CHEMO INJECTION 1 MG/ML
2.0000 mg | Freq: Once | INTRAVENOUS | Status: AC
  Administered 2015-11-28: 2 mg via INTRAVENOUS
  Filled 2015-11-28: qty 2

## 2015-11-28 MED ORDER — ACETAMINOPHEN 325 MG PO TABS
ORAL_TABLET | ORAL | Status: AC
  Filled 2015-11-28: qty 2

## 2015-11-28 MED ORDER — DEXAMETHASONE SODIUM PHOSPHATE 100 MG/10ML IJ SOLN
10.0000 mg | Freq: Once | INTRAMUSCULAR | Status: AC
  Administered 2015-11-28: 10 mg via INTRAVENOUS
  Filled 2015-11-28: qty 1

## 2015-11-28 MED ORDER — PALONOSETRON HCL INJECTION 0.25 MG/5ML
0.2500 mg | Freq: Once | INTRAVENOUS | Status: AC
  Administered 2015-11-28: 0.25 mg via INTRAVENOUS

## 2015-11-28 MED ORDER — DOXORUBICIN HCL CHEMO IV INJECTION 2 MG/ML
50.0000 mg/m2 | Freq: Once | INTRAVENOUS | Status: AC
  Administered 2015-11-28: 100 mg via INTRAVENOUS
  Filled 2015-11-28: qty 50

## 2015-11-28 MED ORDER — DIPHENHYDRAMINE HCL 25 MG PO CAPS
ORAL_CAPSULE | ORAL | Status: AC
  Filled 2015-11-28: qty 2

## 2015-11-28 MED ORDER — PALONOSETRON HCL INJECTION 0.25 MG/5ML
INTRAVENOUS | Status: AC
  Filled 2015-11-28: qty 5

## 2015-11-28 MED ORDER — SODIUM CHLORIDE 0.9 % IV SOLN
INTRAVENOUS | Status: AC
  Administered 2015-11-28: 12:00:00 via INTRAVENOUS

## 2015-11-28 MED ORDER — RITUXIMAB CHEMO INJECTION 500 MG/50ML
375.0000 mg/m2 | Freq: Once | INTRAVENOUS | Status: AC
  Administered 2015-11-28: 800 mg via INTRAVENOUS
  Filled 2015-11-28: qty 70

## 2015-11-28 MED ORDER — ACETAMINOPHEN 325 MG PO TABS
650.0000 mg | ORAL_TABLET | Freq: Once | ORAL | Status: AC
  Administered 2015-11-28: 650 mg via ORAL

## 2015-11-28 MED ORDER — DIPHENHYDRAMINE HCL 25 MG PO CAPS
50.0000 mg | ORAL_CAPSULE | Freq: Once | ORAL | Status: AC
  Administered 2015-11-28: 50 mg via ORAL

## 2015-11-28 NOTE — Progress Notes (Signed)
Nutrition follow-up completed with patient diagnosed with mantle cell lymphoma. Patient reports he has not been weighed since February 7 at which time he weighed 169.8 pounds. Patient states he has been eating well and states he is hungry all of the time. Patient reports no need for oral nutrition supplements. Requesting to be weighed today.  Nutrition diagnosis: Inadequate oral intake resolved.  Educated patient to continue strategies for adequate calories and protein, and use oral nutrition supplements if oral intake decreases or weight loss occurs. Teach back method was used. Patient will contact me with questions or concerns.  **Disclaimer: This note was dictated with voice recognition software. Similar sounding words can inadvertently be transcribed and this note may contain transcription errors which may not have been corrected upon publication of note.**

## 2015-11-28 NOTE — Assessment & Plan Note (Signed)
Patient has had a generalized scabbing rash 2.  Various areas of his body since prior to his recent lymphoma diagnosis. He states that these lesions are pruritic; and he admits to scratching at them.  Patient did have one of these lesions to his right upper arm that had become abscessed; but patient completed Bactrim antibiotics and this lesion has completely resolved.  All of the lesions appear to have slightly improved since his first cycle of chemotherapy.   Patient presented to the [facility] today to receive his second cycle of chemotherapy.  He continues to be concerned regarding his skin rash or eruptions.    Exam today reveals multiple, scattered lesions -with some of these lesions newly formed and oozing clear serous fluid , and some of these lesions scabbed. Patient has lesions to his bilateral upper extremities , his anterior chest, his back and neck , and to his face.    Dr.Kale in to examine patient is well; and continues to feel that these lesions are most likely secondary to patient's lymphoma.   Patient was advised that he may try Zyrtec during the day or Benadryl at night to help prevent itching.  He may also try calamine lotion or Sarna lotion over-the-counter.   Hopefully, patient's rash, lesions.  Will continue to improve with each cycle of chemotherapy.

## 2015-11-28 NOTE — Assessment & Plan Note (Signed)
Patient presents to the [facility] today to receive cycle 2 of his R-CHOP chemotherapy regimen. Patient states he is feeling much better now; and his chronic abdominal fullness has greatly improved.  He has regained his appetite; and is eating well. He denies any recent fevers or chills.  Blood counts obtained today were essentially normal.   patient has no follow-up scheduled at this time.  Will review with Dr.Kale regarding patient's next follow-up appointment.

## 2015-11-28 NOTE — Progress Notes (Signed)
SYMPTOM MANAGEMENT CLINIC   HPI: Kristopher Johnson 60 y.o. male diagnosed with lymphoma.  Currently undergoing R-CHOP chemotherapy regimen.    Patient has had a generalized scabbing rash 2.  Various areas of his body since prior to his recent lymphoma diagnosis. He states that these lesions are pruritic; and he admits to scratching at them.  Patient did have one of these lesions to his right upper arm that had become abscessed; but patient completed Bactrim antibiotics and this lesion has completely resolved.  All of the lesions appear to have slightly improved since his first cycle of chemotherapy.   Patient presented to the [facility] today to receive his second cycle of chemotherapy.  He continues to be concerned regarding his skin rash or eruptions.    Exam today reveals multiple, scattered lesions -with some of these lesions newly formed and oozing clear serous fluid , and some of these lesions scabbed. Patient has lesions to his bilateral upper extremities , his anterior chest, his back and neck , and to his face.    Dr.Kale in to examine patient is well; and continues to feel that these lesions are most likely secondary to patient's lymphoma.   Patient was advised that he may try Zyrtec during the day or Benadryl at night to help prevent itching.  He may also try calamine lotion or Sarna lotion over-the-counter.   Hopefully, patient's rash, lesions.  Will continue to improve with each cycle of chemotherapy.  HPI  ROS  Past Medical History  Diagnosis Date  . Retinitis pigmentosa     Patient notes that he has been declared legally blind since 1983 and is on Social Security disability  . Abscess of arm, right 10/27/2015  . Lymphoma of lymph nodes of multiple sites ([place]) 10/27/2015  . Shortness of breath 10/27/2015    Past Surgical History  Procedure Laterality Date  . Cataract surgery      Patient notes she has had bilateral Surgery in 1998 and 99  . Tonsillectomy      about  60years old    has Lymphadenopathy; Hypoalbuminemia; Elevated lipase; Inguinal hernia; Urinary retention; Abdominal pain; Abscess of arm, right; Lymphoma of lymph nodes of multiple sites ([place]); Shortness of breath; Shortness of breath dyspnea; Mantle cell lymphoma of lymph nodes of multiple regions ([place]); Protein-calorie malnutrition ([place]); Legally blind; and Rash on his problem list.    has No Known Allergies.    Medication List       This list is accurate as of: 11/28/15  3:15 PM.  Always use your most recent med list.               allopurinol 300 MG tablet  Commonly known as:  ZYLOPRIM  Take 1 tablet (300 mg total) by mouth daily.     dexamethasone 4 MG tablet  Commonly known as:  DECADRON  Take 1 tablet (4 mg total) by mouth 2 (two) times daily with breakfast and lunch.     lidocaine-prilocaine cream  Commonly known as:  EMLA  Apply 1 application topically as needed.     ondansetron 8 MG tablet  Commonly known as:  ZOFRAN  Take 1 tablet (8 mg total) by mouth 2 (two) times daily as needed for refractory nausea / vomiting.     prochlorperazine 10 MG tablet  Commonly known as:  COMPAZINE  Take 1 tablet (10 mg total) by mouth every 6 (six) hours as needed (Nausea or vomiting).  PHYSICAL EXAMINATION  Oncology Vitals 11/28/2015 11/28/2015  Height - -  Weight - -  Weight (lbs) - -  BMI (kg/m2) - -  Temp 98.5 98  Pulse 89 87  Resp 18 18  SpO2 97 97  BSA (m2) - -   BP Readings from Last 2 Encounters:  11/28/15 103/59  11/15/15 121/63    Physical Exam  Constitutional: He is oriented to person, place, and time. Vital signs are normal. He appears malnourished. He appears unhealthy. He appears cachectic.  HENT:  Head: Normocephalic and atraumatic.  Eyes: Conjunctivae and EOM are normal. Pupils are equal, round, and reactive to light. Right eye exhibits no discharge. Left eye exhibits no discharge. No scleral icterus.  Neck: Normal range of motion.    Pulmonary/Chest: Effort normal. No respiratory distress.  Musculoskeletal: Normal range of motion.  Neurological: He is alert and oriented to person, place, and time.  Skin: Skin is warm and dry. There is pallor.   Exam today reveals multiple, scattered lesions -with some of these lesions newly formed and oozing clear serous fluid , and some of these lesions scabbed. Patient has lesions to his bilateral upper extremities , his anterior chest, his back and neck , and to his face.     Psychiatric: Affect normal.  Nursing note and vitals reviewed.   LABORATORY DATA:. Appointment on 11/28/2015  Component Date Value Ref Range Status  . WBC 11/28/2015 11.4* 4.0 - 10.3 10e3/uL Final  . NEUT# 11/28/2015 9.2* 1.5 - 6.5 10e3/uL Final  . HGB 11/28/2015 12.2* 13.0 - 17.1 g/dL Final  . HCT 11/28/2015 39.6  38.4 - 49.9 % Final  . Platelets 11/28/2015 195  140 - 400 10e3/uL Final  . MCV 11/28/2015 85.7  79.3 - 98.0 fL Final  . MCH 11/28/2015 26.4* 27.2 - 33.4 pg Final  . MCHC 11/28/2015 30.8* 32.0 - 36.0 g/dL Final  . RBC 11/28/2015 4.62  4.20 - 5.82 10e6/uL Final  . RDW 11/28/2015 16.7* 11.0 - 14.6 % Final  . lymph# 11/28/2015 1.6  0.9 - 3.3 10e3/uL Final  . MONO# 11/28/2015 0.4  0.1 - 0.9 10e3/uL Final  . Eosinophils Absolute 11/28/2015 0.2  0.0 - 0.5 10e3/uL Final  . Basophils Absolute 11/28/2015 0.0  0.0 - 0.1 10e3/uL Final  . NEUT% 11/28/2015 80.7* 39.0 - 75.0 % Final  . LYMPH% 11/28/2015 13.7* 14.0 - 49.0 % Final  . MONO% 11/28/2015 3.2  0.0 - 14.0 % Final  . EOS% 11/28/2015 2.1  0.0 - 7.0 % Final  . BASO% 11/28/2015 0.3  0.0 - 2.0 % Final  . Retic % 11/28/2015 0.80  0.80 - 1.80 % Final  . Retic Ct Abs 11/28/2015 36.96  34.80 - 93.90 10e3/uL Final  . Immature Retic Fract 11/28/2015 9.70  3.00 - 10.60 % Final  . Sodium 11/28/2015 138  136 - 145 mEq/L Final  . Potassium 11/28/2015 4.6  3.5 - 5.1 mEq/L Final  . Chloride 11/28/2015 103  98 - 109 mEq/L Final  . CO2 11/28/2015 27  22 - 29  mEq/L Final  . Glucose 11/28/2015 180* 70 - 140 mg/dl Final   Glucose reference range is for nonfasting patients. Fasting glucose reference range is 70- 100.  [name] Kitchen BUN 11/28/2015 12.1  7.0 - 26.0 mg/dL Final  . Creatinine 11/28/2015 1.0  0.7 - 1.3 mg/dL Final  . Total Bilirubin 11/28/2015 <0.30  0.20 - 1.20 mg/dL Final  . Alkaline Phosphatase 11/28/2015 77  40 - 150 U/L Final  .  AST 11/28/2015 12  5 - 34 U/L Final  . ALT 11/28/2015 15  0 - 55 U/L Final  . Total Protein 11/28/2015 6.6  6.4 - 8.3 g/dL Final  . Albumin 11/28/2015 3.1* 3.5 - 5.0 g/dL Final  . Calcium 11/28/2015 9.3  8.4 - 10.4 mg/dL Final  . Anion Gap 11/28/2015 7  3 - 11 mEq/L Final  . EGFR 11/28/2015 >90  >90 ml/min/1.73 m2 Final   eGFR is calculated using the CKD-EPI Creatinine Equation (2009)         RADIOGRAPHIC STUDIES: No results found.  ASSESSMENT/PLAN:    Rash  Patient has had a generalized scabbing rash 2.  Various areas of his body since prior to his recent lymphoma diagnosis. He states that these lesions are pruritic; and he admits to scratching at them.  Patient did have one of these lesions to his right upper arm that had become abscessed; but patient completed Bactrim antibiotics and this lesion has completely resolved.  All of the lesions appear to have slightly improved since his first cycle of chemotherapy.   Patient presented to the [facility] today to receive his second cycle of chemotherapy.  He continues to be concerned regarding his skin rash or eruptions.    Exam today reveals multiple, scattered lesions -with some of these lesions newly formed and oozing clear serous fluid , and some of these lesions scabbed. Patient has lesions to his bilateral upper extremities , his anterior chest, his back and neck , and to his face.    Dr.Kale in to examine patient is well; and continues to feel that these lesions are most likely secondary to patient's lymphoma.   Patient was advised that he may try Zyrtec  during the day or Benadryl at night to help prevent itching.  He may also try calamine lotion or Sarna lotion over-the-counter.   Hopefully, patient's rash, lesions.  Will continue to improve with each cycle of chemotherapy.    Mantle cell lymphoma of lymph nodes of multiple regions [facility])  Patient presents to the [facility] today to receive cycle 2 of his R-CHOP chemotherapy regimen. Patient states he is feeling much better now; and his chronic abdominal fullness has greatly improved.  He has regained his appetite; and is eating well. He denies any recent fevers or chills.  Blood counts obtained today were essentially normal.   patient has no follow-up scheduled at this time.  Will review with Dr.Kale regarding patient's next follow-up appointment.  Patient stated understanding of all instructions; and was in agreement with this plan of care. The patient knows to call the clinic with any problems, questions or concerns.   This was a shared visit with Dr. Irene Limbo today.   Total time spent with patient was 25 minutes;  with greater than 75 percent of that time spent in face to face counseling regarding patient's symptoms,  and coordination of care and follow up.  Disclaimer:This dictation was prepared with Dragon/digital dictation along with [employer]. Any transcriptional errors that result from this process are unintentional.  Drue Second, NP 11/28/2015   Addendum Patient was seen with Drue Second NP. About documentation reflects our combined assessment and plan. Patient was presently interviewed and examined, relevant results were reviewed and appropriate recommendations were provided.  Sullivan Lone MD MS Hematology/Oncology Physician [facility]

## 2015-11-28 NOTE — Telephone Encounter (Signed)
Per staff message and POF I have scheduled appts. Advised scheduler of appts. JMW  

## 2015-11-28 NOTE — Progress Notes (Signed)
Patient complains of opened, reddened itching areas on his upper right back, right arm, and upper left chest. Patient states the areas are also noticed in his hair. Patient request to see Selena Lesser, NP. Selena Lesser, NP notified. Selena Lesser, NP chairside. Dr. Irene Limbo chairside. Patient instructed to use calamine lotion, reduce shower time and take lukewarm showers and to avoid scratching. Patient verbalized understanding to the above mentioned plan.   Adriamycin administered through a free dripping normal saline line, positive blood return noted before, every 5 mL's during and after adriamycin administration. Patient tolerated well.

## 2015-11-28 NOTE — Patient Instructions (Signed)
[place] Discharge Instructions for Patients Receiving Chemotherapy  Today you received the following chemotherapy agents Rituxan/Adriamycin/Vincristine/Cytoxan To help prevent nausea and vomiting after your treatment, we encourage you to take your nausea medication as prescribed.   If you develop nausea and vomiting that is not controlled by your nausea medication, call the clinic.   BELOW ARE SYMPTOMS THAT SHOULD BE REPORTED IMMEDIATELY:  *FEVER GREATER THAN 100.5 F  *CHILLS WITH OR WITHOUT FEVER  NAUSEA AND VOMITING THAT IS NOT CONTROLLED WITH YOUR NAUSEA MEDICATION  *UNUSUAL SHORTNESS OF BREATH  *UNUSUAL BRUISING OR BLEEDING  TENDERNESS IN MOUTH AND THROAT WITH OR WITHOUT PRESENCE OF ULCERS  *URINARY PROBLEMS  *BOWEL PROBLEMS  UNUSUAL RASH Items with * indicate a potential emergency and should be followed up as soon as possible.  Feel free to call the clinic you have any questions or concerns. The clinic phone number is [phone].  Please show the [place] at check-in to the Emergency Department and triage nurse.  Use calamine lotion or Sarna for open skin areas Avoid long hot showers, shower with lukewarm water Avoid Scratching

## 2015-12-19 ENCOUNTER — Telehealth: Payer: Self-pay | Admitting: *Deleted

## 2015-12-19 ENCOUNTER — Ambulatory Visit (HOSPITAL_BASED_OUTPATIENT_CLINIC_OR_DEPARTMENT_OTHER): Payer: Medicare Other

## 2015-12-19 ENCOUNTER — Encounter: Payer: Self-pay | Admitting: Hematology

## 2015-12-19 ENCOUNTER — Other Ambulatory Visit: Payer: Self-pay | Admitting: *Deleted

## 2015-12-19 ENCOUNTER — Ambulatory Visit (HOSPITAL_BASED_OUTPATIENT_CLINIC_OR_DEPARTMENT_OTHER): Payer: Medicare Other | Admitting: Hematology

## 2015-12-19 VITALS — BP 114/59 | HR 80 | Temp 98.0°F | Resp 18 | Ht 69.0 in | Wt 179.8 lb

## 2015-12-19 VITALS — BP 116/77 | HR 79 | Temp 98.1°F | Resp 16

## 2015-12-19 DIAGNOSIS — C8318 Mantle cell lymphoma, lymph nodes of multiple sites: Secondary | ICD-10-CM

## 2015-12-19 DIAGNOSIS — C8598 Non-Hodgkin lymphoma, unspecified, lymph nodes of multiple sites: Secondary | ICD-10-CM

## 2015-12-19 DIAGNOSIS — L989 Disorder of the skin and subcutaneous tissue, unspecified: Secondary | ICD-10-CM | POA: Diagnosis not present

## 2015-12-19 DIAGNOSIS — H3552 Pigmentary retinal dystrophy: Secondary | ICD-10-CM

## 2015-12-19 DIAGNOSIS — Z720 Tobacco use: Secondary | ICD-10-CM | POA: Diagnosis not present

## 2015-12-19 DIAGNOSIS — Z5111 Encounter for antineoplastic chemotherapy: Secondary | ICD-10-CM

## 2015-12-19 DIAGNOSIS — Z5112 Encounter for antineoplastic immunotherapy: Secondary | ICD-10-CM

## 2015-12-19 DIAGNOSIS — B029 Zoster without complications: Secondary | ICD-10-CM | POA: Insufficient documentation

## 2015-12-19 LAB — COMPREHENSIVE METABOLIC PANEL
ALT: 11 U/L (ref 0–55)
ANION GAP: 7 meq/L (ref 3–11)
AST: 12 U/L (ref 5–34)
Albumin: 3.3 g/dL — ABNORMAL LOW (ref 3.5–5.0)
Alkaline Phosphatase: 78 U/L (ref 40–150)
BUN: 9.9 mg/dL (ref 7.0–26.0)
CHLORIDE: 104 meq/L (ref 98–109)
CO2: 27 meq/L (ref 22–29)
Calcium: 9.2 mg/dL (ref 8.4–10.4)
Creatinine: 0.9 mg/dL (ref 0.7–1.3)
GLUCOSE: 105 mg/dL (ref 70–140)
Potassium: 4 mEq/L (ref 3.5–5.1)
SODIUM: 139 meq/L (ref 136–145)
TOTAL PROTEIN: 6.5 g/dL (ref 6.4–8.3)

## 2015-12-19 LAB — CBC & DIFF AND RETIC
BASO%: 0.6 % (ref 0.0–2.0)
BASOS ABS: 0.1 10*3/uL (ref 0.0–0.1)
EOS ABS: 0.2 10*3/uL (ref 0.0–0.5)
EOS%: 1.8 % (ref 0.0–7.0)
HCT: 40 % (ref 38.4–49.9)
HGB: 12.4 g/dL — ABNORMAL LOW (ref 13.0–17.1)
IMMATURE RETIC FRACT: 11.1 % — AB (ref 3.00–10.60)
LYMPH%: 20.4 % (ref 14.0–49.0)
MCH: 26.9 pg — ABNORMAL LOW (ref 27.2–33.4)
MCHC: 31 g/dL — AB (ref 32.0–36.0)
MCV: 86.8 fL (ref 79.3–98.0)
MONO#: 0.5 10*3/uL (ref 0.1–0.9)
MONO%: 6.2 % (ref 0.0–14.0)
NEUT%: 71 % (ref 39.0–75.0)
NEUTROS ABS: 6.2 10*3/uL (ref 1.5–6.5)
Platelets: 237 10*3/uL (ref 140–400)
RBC: 4.61 10*6/uL (ref 4.20–5.82)
RDW: 17.9 % — AB (ref 11.0–14.6)
RETIC CT ABS: 79.75 10*3/uL (ref 34.80–93.90)
Retic %: 1.73 % (ref 0.80–1.80)
WBC: 8.7 10*3/uL (ref 4.0–10.3)
lymph#: 1.8 10*3/uL (ref 0.9–3.3)

## 2015-12-19 LAB — LACTATE DEHYDROGENASE: LDH: 159 U/L (ref 125–245)

## 2015-12-19 LAB — TECHNOLOGIST REVIEW

## 2015-12-19 MED ORDER — SODIUM CHLORIDE 0.9 % IV SOLN
750.0000 mg/m2 | Freq: Once | INTRAVENOUS | Status: AC
  Administered 2015-12-19: 1500 mg via INTRAVENOUS
  Filled 2015-12-19: qty 75

## 2015-12-19 MED ORDER — ACETAMINOPHEN 325 MG PO TABS
650.0000 mg | ORAL_TABLET | Freq: Once | ORAL | Status: AC
  Administered 2015-12-19: 650 mg via ORAL

## 2015-12-19 MED ORDER — DOXORUBICIN HCL CHEMO IV INJECTION 2 MG/ML
50.0000 mg/m2 | Freq: Once | INTRAVENOUS | Status: AC
  Administered 2015-12-19: 100 mg via INTRAVENOUS
  Filled 2015-12-19: qty 50

## 2015-12-19 MED ORDER — DIPHENHYDRAMINE HCL 25 MG PO CAPS
50.0000 mg | ORAL_CAPSULE | Freq: Once | ORAL | Status: AC
  Administered 2015-12-19: 50 mg via ORAL

## 2015-12-19 MED ORDER — SODIUM CHLORIDE 0.9% FLUSH
10.0000 mL | INTRAVENOUS | Status: DC | PRN
  Administered 2015-12-19: 10 mL
  Filled 2015-12-19: qty 10

## 2015-12-19 MED ORDER — HEPARIN SOD (PORK) LOCK FLUSH 100 UNIT/ML IV SOLN
500.0000 [IU] | Freq: Once | INTRAVENOUS | Status: AC | PRN
  Administered 2015-12-19: 500 [IU]
  Filled 2015-12-19: qty 5

## 2015-12-19 MED ORDER — SODIUM CHLORIDE 0.9 % IV SOLN
Freq: Once | INTRAVENOUS | Status: AC
  Administered 2015-12-19: 10:00:00 via INTRAVENOUS

## 2015-12-19 MED ORDER — DIPHENHYDRAMINE HCL 25 MG PO CAPS
ORAL_CAPSULE | ORAL | Status: AC
  Filled 2015-12-19: qty 2

## 2015-12-19 MED ORDER — ACETAMINOPHEN 325 MG PO TABS
ORAL_TABLET | ORAL | Status: AC
  Filled 2015-12-19: qty 2

## 2015-12-19 MED ORDER — CHLORHEXIDINE GLUCONATE 0.12 % MT SOLN
15.0000 mL | Freq: Four times a day (QID) | OROMUCOSAL | Status: DC
Start: 2015-12-19 — End: 2016-11-06

## 2015-12-19 MED ORDER — DOXYCYCLINE HYCLATE 100 MG PO TABS: 100.0000 mg | ORAL_TABLET | Freq: Two times a day (BID) | ORAL | Status: DC

## 2015-12-19 MED ORDER — ACYCLOVIR 400 MG PO TABS: 400.0000 mg | ORAL_TABLET | Freq: Two times a day (BID) | ORAL | Status: DC

## 2015-12-19 MED ORDER — SODIUM CHLORIDE 0.9 % IV SOLN
375.0000 mg/m2 | Freq: Once | INTRAVENOUS | Status: AC
  Administered 2015-12-19: 800 mg via INTRAVENOUS
  Filled 2015-12-19: qty 70

## 2015-12-19 MED ORDER — SODIUM CHLORIDE 0.9 % IV SOLN
10.0000 mg | Freq: Once | INTRAVENOUS | Status: AC
  Administered 2015-12-19: 10 mg via INTRAVENOUS
  Filled 2015-12-19: qty 1

## 2015-12-19 MED ORDER — SODIUM CHLORIDE 0.9 % IV SOLN
2.0000 mg | Freq: Once | INTRAVENOUS | Status: AC
  Administered 2015-12-19: 2 mg via INTRAVENOUS
  Filled 2015-12-19: qty 2

## 2015-12-19 MED ORDER — PALONOSETRON HCL INJECTION 0.25 MG/5ML
INTRAVENOUS | Status: AC
  Filled 2015-12-19: qty 5

## 2015-12-19 MED ORDER — PALONOSETRON HCL INJECTION 0.25 MG/5ML
0.2500 mg | Freq: Once | INTRAVENOUS | Status: AC
  Administered 2015-12-19: 0.25 mg via INTRAVENOUS

## 2015-12-19 MED ORDER — SODIUM CHLORIDE 0.9 % IV SOLN
Freq: Once | INTRAVENOUS | Status: AC
  Administered 2015-12-19: 12:00:00 via INTRAVENOUS

## 2015-12-19 NOTE — Telephone Encounter (Signed)
Per staff message and POF I have scheduled appts. Advised scheduler of appts. JMW  

## 2015-12-19 NOTE — Patient Instructions (Addendum)
[facility] Discharge Instructions for Patients Receiving Chemotherapy  Today you received the following chemotherapy agents: Adriamycin, Vincristine, Rituxan, and Cytoxan.  To help prevent nausea and vomiting after your treatment, we encourage you to take your nausea medication   If you develop nausea and vomiting that is not controlled by your nausea medication, call the clinic.   BELOW ARE SYMPTOMS THAT SHOULD BE REPORTED IMMEDIATELY:  *FEVER GREATER THAN 100.5 F  *CHILLS WITH OR WITHOUT FEVER  NAUSEA AND VOMITING THAT IS NOT CONTROLLED WITH YOUR NAUSEA MEDICATION  *UNUSUAL SHORTNESS OF BREATH  *UNUSUAL BRUISING OR BLEEDING  TENDERNESS IN MOUTH AND THROAT WITH OR WITHOUT PRESENCE OF ULCERS  *URINARY PROBLEMS  *BOWEL PROBLEMS  UNUSUAL RASH Items with * indicate a potential emergency and should be followed up as soon as possible.  Feel free to call the clinic you have any questions or concerns. The clinic phone number is [phone].  Please show the [place] at check-in to the Emergency Department and triage nurse.

## 2015-12-20 ENCOUNTER — Encounter: Payer: Self-pay | Admitting: Hematology

## 2015-12-20 NOTE — Progress Notes (Signed)
Pt presented me w/ an approval notice from Medicaid however, after speaking with his case worker Rojelio Brenner [phone], pt was not approved for full Medicaid because he exceeded the income requirement and has to meet a $5,370 deductible.  His Medicaid only pays for his Medicare B premium.  She said she relayed this info to him already so I will call and reiterate this info again.

## 2015-12-22 LAB — MRSA CULTURE: MRSA SCREEN: NEGATIVE

## 2015-12-22 NOTE — Progress Notes (Signed)
Kristopher Johnson    HEMATOLOGY/ONCOLOGY CLINIC NOTE  Date of Service: .12/19/2015  Patient Care Team: No Pcp Per Patient as PCP - General (General Practice)  CHIEF COMPLAINTS/PURPOSE OF CONSULTATION:   followup for New Diagnosed Mantle Cell lymphoma  DIAGNOSIS 1) Stage IV Mantle cell lymphoma  TREATMENT  R-CHOP 6 cycles planned followed by Rituxan maintenance. Patient chooses not to pursue AutoHSCT based strategies which is quite reasonable considering his functional challenges with legal blindness and limited social support.   HISTORY OF PRESENTING ILLNESS: plz see clinic note for details on initial presentation  Interval History  Kristopher Johnson is here for followup alongwith his daughter for his scheduled followup prior to his surgery cycle of R CHOP chemotherapy.  He notes some mild fatigue but has been doing well overall.  Notes no fevers or chills.  Notes that his abdomen has become much softer and that he has developed a significant appetite and feels like eating all the time. He notes some pruritus and continues to scratch his skin and notes recurrent nodular lesions.  Most of the lesions have improved.  He has noted developed some pruritic somewhat painful crusted lesions on his right scapular area which are concerning for healing shingles rash.  There is also concern that he might be an MRSA carrier given that he is developing recurrent skin infections.  He was given a prescription for doxycycline and acyclovir. He is okay with seeing a dermatologist and was given a referral for this to consider biopsy of one of the skin lesions and determine if this represents lymphoma or an alternative etiology. Patient also notes some gum discomfort.  Noted to have very poor dentition with significant periodontal disease.  He was given a dental referral and Peridex mouthwash.   He notes that he did get a referral for the retina specialist and has an upcoming appointment.  MEDICAL HISTORY:  Past Medical  History  Diagnosis Date  . Retinitis pigmentosa     Patient notes that he has been declared legally blind since 1983 and is on Social Security disability  . Abscess of arm, right 10/27/2015  . Lymphoma of lymph nodes of multiple sites ([place]) 10/27/2015  . Shortness of breath 10/27/2015    SURGICAL HISTORY: Past Surgical History  Procedure Laterality Date  . Cataract surgery      Patient notes she has had bilateral Surgery in 1998 and 99  . Tonsillectomy      about 60years old    SOCIAL HISTORY: Social History   Social History  . Marital Status: Widowed    Spouse Name: N/A  . Number of Children: N/A  . Years of Education: N/A   Occupational History  . Not on file.   Social History Main Topics  . Smoking status: Current Every Day Smoker -- 1.00 packs/day for 33 years  . Smokeless tobacco: Never Used  . Alcohol Use: No  . Drug Use: No  . Sexual Activity: Not on file     Comment: Disabled, retinis pigmentosa legally blind, 4children MPOA Lynnn(cousin)   Other Topics Concern  . Not on file   Social History Narrative    FAMILY HISTORY: History reviewed. No pertinent family history.  ALLERGIES:  has No Known Allergies.  MEDICATIONS:  Current Outpatient Prescriptions  Medication Sig Dispense Refill  . acyclovir (ZOVIRAX) 400 MG tablet Take 1 tablet (400 mg total) by mouth 2 (two) times daily. 60 tablet 3  . allopurinol (ZYLOPRIM) 300 MG tablet Take 1 tablet (300 mg total)  by mouth daily. (Patient not taking: Reported on 11/07/2015) 30 tablet 0  . chlorhexidine (PERIDEX) 0.12 % solution Use as directed 15 mLs in the mouth or throat 4 (four) times daily. 240 mL 2  . dexamethasone (DECADRON) 4 MG tablet Take 1 tablet (4 mg total) by mouth 2 (two) times daily with breakfast and lunch. 60 tablet 0  . doxycycline (VIBRA-TABS) 100 MG tablet Take 1 tablet (100 mg total) by mouth 2 (two) times daily. 20 tablet 0  . lidocaine-prilocaine (EMLA) cream Apply 1 application topically as  needed. 30 g 6  . ondansetron (ZOFRAN) 8 MG tablet Take 1 tablet (8 mg total) by mouth 2 (two) times daily as needed for refractory nausea / vomiting. (Patient not taking: Reported on 11/07/2015) 30 tablet 1  . prochlorperazine (COMPAZINE) 10 MG tablet Take 1 tablet (10 mg total) by mouth every 6 (six) hours as needed (Nausea or vomiting). (Patient not taking: Reported on 11/07/2015) 30 tablet 6   No current facility-administered medications for this visit.    REVIEW OF SYSTEMS:    10 Point review of Systems was done is negative except as noted above.  PHYSICAL EXAMINATION: ECOG PERFORMANCE STATUS: 2 - Symptomatic, <50% confined to bed  . Filed Vitals:   12/19/15 0905  BP: 114/59  Pulse: 80  Temp: 98 F (36.7 C)  Resp: 18   Filed Weights   12/19/15 0905  Weight: 179 lb 12.8 oz (81.557 kg)   .Body mass index is 26.54 kg/(m^2).  GENERAL:alert, in no acute distress and comfortable SKIN: some nodular skin lesions over extremities and back, beginning crusted lesions over his right scapular area. EYES: normal, conjunctiva are pink and non-injected, sclera clear. Poor visual acuity - some directional light perception but difficulty with finger counting. OROPHARYNX:no exudate, no erythema and lips, multiple carious teeth with significant periodontal disease. NECK: supple, no JVD, thyroid normal size, non-tender, without nodularity LYMPH:  no palpable lymphadenopathy in the cervical, axillary or inguinal LUNGS: clear to auscultation with normal respiratory effort HEART: regular rate & rhythm,  no murmurs and no lower extremity edema ABDOMEN: abdomen soft, non-tender, normoactive bowel sounds  Musculoskeletal: no cyanosis of digits and no clubbing  PSYCH: alert & oriented x 3 with fluent speech NEURO: no focal motor/sensory deficits  LABORATORY DATA:  I have reviewed the data as listed  . CBC Latest Ref Rng 12/19/2015 11/28/2015 11/15/2015  WBC 4.0 - 10.3 10e3/uL 8.7 11.4(H) 3.5(L)    Hemoglobin 13.0 - 17.1 g/dL 12.4(L) 12.2(L) 11.9(L)  Hematocrit 38.4 - 49.9 % 40.0 39.6 38.0(L)  Platelets 140 - 400 10e3/uL 237 195 214    . CMP Latest Ref Rng 12/19/2015 11/28/2015 11/15/2015  Glucose 70 - 140 mg/dl 105 180(H) 210(H)  BUN 7.0 - 26.0 mg/dL 9.9 12.1 13.9  Creatinine 0.7 - 1.3 mg/dL 0.9 1.0 0.9  Sodium 136 - 145 mEq/L 139 138 138  Potassium 3.5 - 5.1 mEq/L 4.0 4.6 3.8  CO2 22 - 29 mEq/L 27 27 25   Calcium 8.4 - 10.4 mg/dL 9.2 9.3 8.9  Total Protein 6.4 - 8.3 g/dL 6.5 6.6 6.5  Total Bilirubin 0.20 - 1.20 mg/dL <0.30 <0.30 0.32  Alkaline Phos 40 - 150 U/L 78 77 87  AST 5 - 34 U/L 12 12 9   ALT 0 - 55 U/L 11 15 10    Component     Latest Ref Rng 10/25/2015 11/01/2015  HIV     Non Reactive Non Reactive   HCV Ab  0.0 - 0.9 s/co ratio 0.2   Hepatitis B Surface Ag     Negative Negative   Hep B S Ab      Non Reactive   Comment      Comment   LDH     125 - 245 U/L  441 (H)   . Lab Results  Component Value Date   LDH 159 12/19/2015    Uric acid 7.1   RADIOGRAPHIC STUDIES: I have personally reviewed the radiological images as listed and agreed with the findings in the report. No results found.  ECHO: 11/01/2015: Study Conclusions  - Left ventricle: The cavity size was normal. There was mild concentric hypertrophy. Systolic function was normal. The estimated ejection fraction was in the range of 50% to 55%. Wall motion was normal; there were no regional wall motion abnormalities. Doppler parameters are consistent with abnormal left ventricular relaxation (grade 1 diastolic dysfunction). - Aortic valve: Transvalvular velocity was within the normal range. There was no stenosis. There was no regurgitation. - Mitral valve: There was no regurgitation. - Right ventricle: The cavity size was normal. Wall thickness was normal. Systolic function was normal. - Tricuspid valve: There was no regurgitation. - Inferior vena cava: The vessel was normal in  size. The respirophasic diameter changes were in the normal range (>= 50%), consistent with normal central venous pressure.Study Conclusions  - Left ventricle: The cavity size was normal. There was mild concentric hypertrophy. Systolic function was normal. The estimated ejection fraction was in the range of 50% to 55%. Wall motion was normal; there were no regional wall motion abnormalities. Doppler parameters are consistent with abnormal left ventricular relaxation (grade 1 diastolic dysfunction). - Aortic valve: Transvalvular velocity was within the normal range. There was no stenosis. There was no regurgitation. - Mitral valve: There was no regurgitation. - Right ventricle: The cavity size was normal. Wall thickness was normal. Systolic function was normal. - Tricuspid valve: There was no regurgitation. - Inferior vena cava: The vessel was normal in size. The respirophasic diameter changes were in the normal range (>= 50%), consistent with normal central venous pressure.    ASSESSMENT & PLAN:    60 yo man with   1) Stage IVB E Mantle Cell lymphoma with likely gastric involvement, extensive LNadenopathy. ECHO nl EF ECOG PS 1-2 but activities significantly limited due to being legally blind HIV/Hep C/Hep B neg Baseline LDH 450 PET/CT as noted above shows extensive lymphoma involvement  Patient is status post 2 cycles of R CHOP which he has tolerated well without any acute concerns. Abdominal distention has significantly improved and he is eating much better. LDH levels have completely normalized.  Kristopher Kitchen2) Retinitis pigmentosa causing legal blindness - Has been given a referral to a retina specialist and has an upcoming appointment.  3) Weight loss and protein calorie malnutrition. He is eating well and has gained 10 pounds since his last appointment . Wt Readings from Last 3 Encounters:  12/19/15 179 lb 12.8 oz (81.557 kg)  11/15/15 169 lb 12.8 oz  (77.021 kg)  11/10/15 174 lb 14 oz (79.323 kg)    4) multiple scab like/nodular skin lesions ? Due to pruritus and scratching with likely skin involvement by lymphoma. Skin lesions were resolving rapidly with just his first cycle of R-CHOP. He appears to keep getting them and there is concern that he is causing infections due to scratching. Plan -MRSA nasal swab ordered. -We'll treat with doxycycline for skin/soft tissue infection. -Acyclovir prophylaxis. -no prohibitive toxicities  labs stable. Patient appropriate to proceed with third cycle of R CHOP. -blood counts stable. Not neutropenic at this time. We are watching for this to determine if he might need G-CSF with future cycles. -planning for R-CHOP x 6 followed by Rituxan maintenance if CR achieved.  -we'll repeat a restaging PET/CT scan prior to his next cycle of  chemotherapy -recounselled on need for good po food intake and aggressive hydration. -monitor for concerns of UGI bleeding with gastric involvement. -Patient needs to establish a PCP - reinforced this again. -Given a referral to dermatology for evaluation for skin lesions and consideration of skin biopsy. -Peridex mouthwash  for periodontal disease.dental referral given. -He has an upcoming appointment with the retina specialist for continued management of his retinitis pigmentosa.  All of the patients and his daughters questions were answered to his  apparent satisfaction. The patient knows to call the clinic with any problems, questions or concerns.  I spent 25 minutes counseling the patient face to face. The total time spent in the appointment was 25 minutes and more than 50% was on counseling and direct patient cares.    Sullivan Lone MD [place] AAHIVMS [facility] Hematology/Oncology Physician [facility]  (Office):       [phone] (Work cell):  [phone] (Fax):           [phone]

## 2015-12-30 ENCOUNTER — Ambulatory Visit (HOSPITAL_COMMUNITY)
Admission: RE | Admit: 2015-12-30 | Discharge: 2015-12-30 | Disposition: A | Discharge status: Home / Self Care | Payer: Medicare Other | Source: Ambulatory Visit | Attending: Hematology | Admitting: Hematology

## 2015-12-30 DIAGNOSIS — R918 Other nonspecific abnormal finding of lung field: Secondary | ICD-10-CM | POA: Diagnosis not present

## 2015-12-30 DIAGNOSIS — M899 Disorder of bone, unspecified: Secondary | ICD-10-CM | POA: Diagnosis not present

## 2015-12-30 DIAGNOSIS — R590 Localized enlarged lymph nodes: Secondary | ICD-10-CM | POA: Insufficient documentation

## 2015-12-30 DIAGNOSIS — C8313 Mantle cell lymphoma, intra-abdominal lymph nodes: Secondary | ICD-10-CM | POA: Diagnosis not present

## 2015-12-30 DIAGNOSIS — C8318 Mantle cell lymphoma, lymph nodes of multiple sites: Secondary | ICD-10-CM | POA: Diagnosis not present

## 2015-12-30 LAB — GLUCOSE, CAPILLARY: Glucose-Capillary: 114 mg/dL — ABNORMAL HIGH (ref 65–99)

## 2015-12-30 MED ORDER — FLUDEOXYGLUCOSE F - 18 (FDG) INJECTION
8.8000 | Freq: Once | INTRAVENOUS | Status: AC | PRN
  Administered 2015-12-30: 8.8 via INTRAVENOUS

## 2016-01-09 ENCOUNTER — Ambulatory Visit: Payer: Medicare Other

## 2016-01-09 ENCOUNTER — Other Ambulatory Visit: Payer: Self-pay | Admitting: Hematology

## 2016-01-09 ENCOUNTER — Other Ambulatory Visit (HOSPITAL_BASED_OUTPATIENT_CLINIC_OR_DEPARTMENT_OTHER): Payer: Medicare Other

## 2016-01-09 ENCOUNTER — Encounter: Payer: Self-pay | Admitting: Hematology

## 2016-01-09 ENCOUNTER — Ambulatory Visit (HOSPITAL_BASED_OUTPATIENT_CLINIC_OR_DEPARTMENT_OTHER): Payer: Medicare Other

## 2016-01-09 ENCOUNTER — Ambulatory Visit (HOSPITAL_BASED_OUTPATIENT_CLINIC_OR_DEPARTMENT_OTHER): Payer: Medicare Other | Admitting: Hematology

## 2016-01-09 VITALS — BP 110/55 | HR 74 | Temp 98.3°F | Resp 16

## 2016-01-09 VITALS — BP 119/55 | HR 75 | Temp 97.7°F | Resp 18 | Ht 69.0 in | Wt 183.3 lb

## 2016-01-09 DIAGNOSIS — R634 Abnormal weight loss: Secondary | ICD-10-CM

## 2016-01-09 DIAGNOSIS — H3552 Pigmentary retinal dystrophy: Secondary | ICD-10-CM | POA: Diagnosis not present

## 2016-01-09 DIAGNOSIS — C8318 Mantle cell lymphoma, lymph nodes of multiple sites: Secondary | ICD-10-CM

## 2016-01-09 DIAGNOSIS — Z5111 Encounter for antineoplastic chemotherapy: Secondary | ICD-10-CM | POA: Diagnosis not present

## 2016-01-09 DIAGNOSIS — E46 Unspecified protein-calorie malnutrition: Secondary | ICD-10-CM | POA: Diagnosis not present

## 2016-01-09 DIAGNOSIS — Z95828 Presence of other vascular implants and grafts: Secondary | ICD-10-CM

## 2016-01-09 DIAGNOSIS — L989 Disorder of the skin and subcutaneous tissue, unspecified: Secondary | ICD-10-CM | POA: Diagnosis not present

## 2016-01-09 DIAGNOSIS — Z5112 Encounter for antineoplastic immunotherapy: Secondary | ICD-10-CM | POA: Diagnosis present

## 2016-01-09 LAB — CBC & DIFF AND RETIC
BASO%: 1 % (ref 0.0–2.0)
BASOS ABS: 0.1 10*3/uL (ref 0.0–0.1)
EOS%: 1.1 % (ref 0.0–7.0)
Eosinophils Absolute: 0.1 10*3/uL (ref 0.0–0.5)
HEMATOCRIT: 39.8 % (ref 38.4–49.9)
HGB: 12.3 g/dL — ABNORMAL LOW (ref 13.0–17.1)
Immature Retic Fract: 16.5 % — ABNORMAL HIGH (ref 3.00–10.60)
LYMPH%: 11 % — ABNORMAL LOW (ref 14.0–49.0)
MCH: 27.1 pg — AB (ref 27.2–33.4)
MCHC: 30.9 g/dL — AB (ref 32.0–36.0)
MCV: 87.7 fL (ref 79.3–98.0)
MONO#: 1.6 10*3/uL — ABNORMAL HIGH (ref 0.1–0.9)
MONO%: 18.1 % — AB (ref 0.0–14.0)
NEUT#: 6.2 10*3/uL (ref 1.5–6.5)
NEUT%: 68.8 % (ref 39.0–75.0)
PLATELETS: 243 10*3/uL (ref 140–400)
RBC: 4.54 10*6/uL (ref 4.20–5.82)
RDW: 18.3 % — AB (ref 11.0–14.6)
RETIC %: 1.92 % — AB (ref 0.80–1.80)
Retic Ct Abs: 87.17 10*3/uL (ref 34.80–93.90)
WBC: 9 10*3/uL (ref 4.0–10.3)
lymph#: 1 10*3/uL (ref 0.9–3.3)

## 2016-01-09 LAB — COMPREHENSIVE METABOLIC PANEL
ALT: 14 U/L (ref 0–55)
ANION GAP: 8 meq/L (ref 3–11)
AST: 14 U/L (ref 5–34)
Albumin: 3.4 g/dL — ABNORMAL LOW (ref 3.5–5.0)
Alkaline Phosphatase: 61 U/L (ref 40–150)
BUN: 11 mg/dL (ref 7.0–26.0)
CALCIUM: 9.4 mg/dL (ref 8.4–10.4)
CHLORIDE: 106 meq/L (ref 98–109)
CO2: 28 mEq/L (ref 22–29)
CREATININE: 0.9 mg/dL (ref 0.7–1.3)
Glucose: 118 mg/dl (ref 70–140)
POTASSIUM: 3.8 meq/L (ref 3.5–5.1)
Sodium: 142 mEq/L (ref 136–145)
Total Bilirubin: <0.3 mg/dL (ref 0.20–1.20)
Total Protein: 6.6 g/dL (ref 6.4–8.3)

## 2016-01-09 LAB — LACTATE DEHYDROGENASE: LDH: 150 U/L (ref 125–245)

## 2016-01-09 MED ORDER — SODIUM CHLORIDE 0.9% FLUSH
10.0000 mL | INTRAVENOUS | Status: DC | PRN
  Administered 2016-01-09: 10 mL
  Filled 2016-01-09: qty 10

## 2016-01-09 MED ORDER — HEPARIN SOD (PORK) LOCK FLUSH 100 UNIT/ML IV SOLN
500.0000 [IU] | Freq: Once | INTRAVENOUS | Status: AC | PRN
  Administered 2016-01-09: 500 [IU]
  Filled 2016-01-09: qty 5

## 2016-01-09 MED ORDER — DOXORUBICIN HCL CHEMO IV INJECTION 2 MG/ML
50.0000 mg/m2 | Freq: Once | INTRAVENOUS | Status: AC
  Administered 2016-01-09: 100 mg via INTRAVENOUS
  Filled 2016-01-09: qty 50

## 2016-01-09 MED ORDER — SODIUM CHLORIDE 0.9% FLUSH
10.0000 mL | INTRAVENOUS | Status: DC | PRN
  Administered 2016-01-09: 10 mL via INTRAVENOUS
  Filled 2016-01-09: qty 10

## 2016-01-09 MED ORDER — SODIUM CHLORIDE 0.9 % IV SOLN
10.0000 mg | Freq: Once | INTRAVENOUS | Status: AC
  Administered 2016-01-09: 10 mg via INTRAVENOUS
  Filled 2016-01-09: qty 1

## 2016-01-09 MED ORDER — DIPHENHYDRAMINE HCL 25 MG PO CAPS
50.0000 mg | ORAL_CAPSULE | Freq: Once | ORAL | Status: AC
  Administered 2016-01-09: 50 mg via ORAL

## 2016-01-09 MED ORDER — SODIUM CHLORIDE 0.9 % IV SOLN
750.0000 mg/m2 | Freq: Once | INTRAVENOUS | Status: AC
  Administered 2016-01-09: 1500 mg via INTRAVENOUS
  Filled 2016-01-09: qty 75

## 2016-01-09 MED ORDER — SODIUM CHLORIDE 0.9 % IV SOLN
2.0000 mg | Freq: Once | INTRAVENOUS | Status: AC
  Administered 2016-01-09: 2 mg via INTRAVENOUS
  Filled 2016-01-09: qty 2

## 2016-01-09 MED ORDER — SODIUM CHLORIDE 0.9 % IV SOLN
Freq: Once | INTRAVENOUS | Status: AC
  Administered 2016-01-09: 10:00:00 via INTRAVENOUS

## 2016-01-09 MED ORDER — ACETAMINOPHEN 325 MG PO TABS
650.0000 mg | ORAL_TABLET | Freq: Once | ORAL | Status: AC
Start: 2016-01-09 — End: 2016-01-09
  Administered 2016-01-09: 650 mg via ORAL

## 2016-01-09 MED ORDER — PALONOSETRON HCL INJECTION 0.25 MG/5ML
INTRAVENOUS | Status: AC
  Filled 2016-01-09: qty 5

## 2016-01-09 MED ORDER — SODIUM CHLORIDE 0.9 % IV SOLN
375.0000 mg/m2 | Freq: Once | INTRAVENOUS | Status: AC
  Administered 2016-01-09: 800 mg via INTRAVENOUS
  Filled 2016-01-09: qty 70

## 2016-01-09 MED ORDER — PALONOSETRON HCL INJECTION 0.25 MG/5ML
0.2500 mg | Freq: Once | INTRAVENOUS | Status: AC
  Administered 2016-01-09: 0.25 mg via INTRAVENOUS

## 2016-01-09 MED ORDER — SODIUM CHLORIDE 0.9 % IV SOLN
INTRAVENOUS | Status: AC
  Administered 2016-01-09: 10:00:00 via INTRAVENOUS

## 2016-01-09 MED ORDER — DIPHENHYDRAMINE HCL 25 MG PO CAPS
ORAL_CAPSULE | ORAL | Status: AC
  Filled 2016-01-09: qty 1

## 2016-01-09 MED ORDER — ACETAMINOPHEN 325 MG PO TABS
ORAL_TABLET | ORAL | Status: AC
  Filled 2016-01-09: qty 2

## 2016-01-09 NOTE — Patient Instructions (Signed)
[facility] Discharge Instructions for Patients Receiving Chemotherapy  Today you received the following chemotherapy agents RCHOP  To help prevent nausea and vomiting after your treatment, we encourage you to take your nausea medication as needed   If you develop nausea and vomiting that is not controlled by your nausea medication, call the clinic.   BELOW ARE SYMPTOMS THAT SHOULD BE REPORTED IMMEDIATELY:  *FEVER GREATER THAN 100.5 F  *CHILLS WITH OR WITHOUT FEVER  NAUSEA AND VOMITING THAT IS NOT CONTROLLED WITH YOUR NAUSEA MEDICATION  *UNUSUAL SHORTNESS OF BREATH  *UNUSUAL BRUISING OR BLEEDING  TENDERNESS IN MOUTH AND THROAT WITH OR WITHOUT PRESENCE OF ULCERS  *URINARY PROBLEMS  *BOWEL PROBLEMS  UNUSUAL RASH Items with * indicate a potential emergency and should be followed up as soon as possible.  Feel free to call the clinic you have any questions or concerns. The clinic phone number is [phone].  Please show the [place] at check-in to the Emergency Department and triage nurse.

## 2016-01-09 NOTE — Patient Instructions (Signed)

## 2016-01-09 NOTE — Progress Notes (Signed)
Kristopher Johnson    HEMATOLOGY/ONCOLOGY CLINIC NOTE  Date of Service: .01/09/2016   Patient Care Team: No Pcp Per Patient as PCP - General (General Practice)  CHIEF COMPLAINTS/PURPOSE OF CONSULTATION:   followup for New Diagnosed Mantle Cell lymphoma  DIAGNOSIS 1) Stage IV Mantle cell lymphoma  Current TREATMENT  Status post R CHOP 3 cycles  PLANNED RX  R-CHOP 6 cycles planned followed by Rituxan maintenance. Patient chooses not to pursue AutoHSCT based strategies which is quite reasonable considering his functional challenges with legal blindness and limited social support.   HISTORY OF PRESENTING ILLNESS: plz see clinic note for details on initial presentation  Interval History  [name] is here for followup alongwith his daughter for his scheduled followup prior to his fourth cycle of R CHOP area he had a PET/CT scan that shows good overall response. Some persistence of high SUV uptake gastric lesion and some questionable new bone lesions. He notes that he feels great and his skin has been clearing up. No fevers. No chills. Minimal night sweats. Notes that he is "eating like a horse". He has gained 4 pounds since his last clinic visit. Has a  Retina specialist appointment coming up at the end of the month.    MEDICAL HISTORY:  Past Medical History  Diagnosis Date  . Retinitis pigmentosa     Patient notes that he has been declared legally blind since 1983 and is on Social Security disability  . Abscess of arm, right 10/27/2015  . Lymphoma of lymph nodes of multiple sites ([place]) 10/27/2015  . Shortness of breath 10/27/2015    SURGICAL HISTORY: Past Surgical History  Procedure Laterality Date  . Cataract surgery      Patient notes she has had bilateral Surgery in 1998 and 99  . Tonsillectomy      about 60years old    SOCIAL HISTORY: Social History   Social History  . Marital Status: Widowed    Spouse Name: N/A  . Number of Children: N/A  . Years of Education: N/A    Occupational History  . Not on file.   Social History Main Topics  . Smoking status: Current Every Day Smoker -- 1.00 packs/day for 33 years  . Smokeless tobacco: Never Used  . Alcohol Use: No  . Drug Use: No  . Sexual Activity: Not on file     Comment: Disabled, retinis pigmentosa legally blind, 4children MPOA Lynnn(cousin)   Other Topics Concern  . Not on file   Social History Narrative    FAMILY HISTORY: History reviewed. No pertinent family history.  ALLERGIES:  has No Known Allergies.  MEDICATIONS:  Current Outpatient Prescriptions  Medication Sig Dispense Refill  . acyclovir (ZOVIRAX) 400 MG tablet Take 1 tablet (400 mg total) by mouth 2 (two) times daily. 60 tablet 3  . allopurinol (ZYLOPRIM) 300 MG tablet Take 1 tablet (300 mg total) by mouth daily. (Patient not taking: Reported on 11/07/2015) 30 tablet 0  . chlorhexidine (PERIDEX) 0.12 % solution Use as directed 15 mLs in the mouth or throat 4 (four) times daily. 240 mL 2  . dexamethasone (DECADRON) 4 MG tablet Take 1 tablet (4 mg total) by mouth 2 (two) times daily with breakfast and lunch. 60 tablet 0  . doxycycline (VIBRA-TABS) 100 MG tablet Take 1 tablet (100 mg total) by mouth 2 (two) times daily. 20 tablet 0  . lidocaine-prilocaine (EMLA) cream Apply 1 application topically as needed. 30 g 6  . ondansetron (ZOFRAN) 8 MG tablet Take  1 tablet (8 mg total) by mouth 2 (two) times daily as needed for refractory nausea / vomiting. (Patient not taking: Reported on 11/07/2015) 30 tablet 1  . prochlorperazine (COMPAZINE) 10 MG tablet Take 1 tablet (10 mg total) by mouth every 6 (six) hours as needed (Nausea or vomiting). (Patient not taking: Reported on 11/07/2015) 30 tablet 6   No current facility-administered medications for this visit.    REVIEW OF SYSTEMS:    10 Point review of Systems was done is negative except as noted above.  PHYSICAL EXAMINATION: ECOG PERFORMANCE STATUS: 2 - Symptomatic, <50% confined to  bed  . Filed Vitals:   01/09/16 0847  BP: 119/55  Pulse: 75  Temp: 97.7 F (36.5 C)  Resp: 18   Filed Weights   01/09/16 0847  Weight: 183 lb 4.8 oz (83.144 kg)   .Body mass index is 27.06 kg/(m^2).  GENERAL:alert, in no acute distress and comfortable SKIN: some nodular skin lesions over extremities and back, beginning crusted lesions over his right scapular area. EYES: normal, conjunctiva are pink and non-injected, sclera clear. Poor visual acuity - some directional light perception but difficulty with finger counting. OROPHARYNX:no exudate, no erythema and lips, multiple carious teeth with significant periodontal disease. NECK: supple, no JVD, thyroid normal size, non-tender, without nodularity LYMPH:  no palpable lymphadenopathy in the cervical, axillary or inguinal LUNGS: clear to auscultation with normal respiratory effort HEART: regular rate & rhythm,  no murmurs and no lower extremity edema ABDOMEN: abdomen soft, non-tender, normoactive bowel sounds  Musculoskeletal: no cyanosis of digits and no clubbing  PSYCH: alert & oriented x 3 with fluent speech NEURO: no focal motor/sensory deficits  LABORATORY DATA:  I have reviewed the data as listed  . CBC Latest Ref Rng 01/09/2016 12/19/2015 11/28/2015  WBC 4.0 - 10.3 10e3/uL 9.0 8.7 11.4(H)  Hemoglobin 13.0 - 17.1 g/dL 12.3(L) 12.4(L) 12.2(L)  Hematocrit 38.4 - 49.9 % 39.8 40.0 39.6  Platelets 140 - 400 10e3/uL 243 237 195    . CMP Latest Ref Rng 01/09/2016 12/19/2015 11/28/2015  Glucose 70 - 140 mg/dl 118 105 180(H)  BUN 7.0 - 26.0 mg/dL 11.0 9.9 12.1  Creatinine 0.7 - 1.3 mg/dL 0.9 0.9 1.0  Sodium 136 - 145 mEq/L 142 139 138  Potassium 3.5 - 5.1 mEq/L 3.8 4.0 4.6  CO2 22 - 29 mEq/L 28 27 27   Calcium 8.4 - 10.4 mg/dL 9.4 9.2 9.3  Total Protein 6.4 - 8.3 g/dL 6.6 6.5 6.6  Total Bilirubin 0.20 - 1.20 mg/dL <0.30 <0.30 <0.30  Alkaline Phos 40 - 150 U/L 61 78 77  AST 5 - 34 U/L 14 12 12   ALT 0 - 55 U/L 14 11 15     Component     Latest Ref Rng 10/25/2015 11/01/2015  HIV     Non Reactive Non Reactive   HCV Ab     0.0 - 0.9 s/co ratio 0.2   Hepatitis B Surface Ag     Negative Negative   Hep B S Ab      Non Reactive   Comment      Comment   LDH     125 - 245 U/L  441 (H)   . Lab Results  Component Value Date   LDH 159 12/19/2015    Uric acid 7.1   RADIOGRAPHIC STUDIES: I have personally reviewed the radiological images as listed and agreed with the findings in the report. Nm Pet Image Restag (ps) Skull Base To Thigh  12/30/2015  CLINICAL DATA:  Subsequent treatment strategy for mantle cell lymphoma. EXAM: NUCLEAR MEDICINE PET SKULL BASE TO THIGH TECHNIQUE: 8.8 mCi F-18 FDG was injected intravenously. Full-ring PET imaging was performed from the skull base to thigh after the radiotracer. CT data was obtained and used for attenuation correction and anatomic localization. FASTING BLOOD GLUCOSE:  Value: 114 mg/dl COMPARISON:  PET-CT 11/14/2015 FINDINGS: NECK Persistent mildly and diffusely hypermetabolic glandular tissue including the parotid and submandibular glands and lacrimal glands. The thyroid gland is diffusely hypermetabolic and unchanged. Interval decrease in size of the bilateral parathyroid nodules. The left-sided lymph node had an SUV max of 9.8. This is now 3.5. The right-sided node had an SUV max 12.2 and is now 2.8. CHEST Interval decrease in size and decreased metabolic activity and bilateral axillary adenopathy. The largest left axillary node has an SUV max of 4.1. This was previously 13.4. Internal mammary lymphadenopathy is also improved. The right internal mammary lymph node which was hypermetabolic on the prior study is no longer identified. Interval improved mediastinal lymphadenopathy. Decreased size and metabolic activity when compared to the prior study. SUV max with decreased from 10.2 to 4.3 Extrapleural/ paraspinal disease on the right side has decreased in size since the prior  study. It previously measured 28.5 x 21.5 mm and now measures 20 x 17 mm. SUV max was 19.9 and is now 3.4. The bilateral pleural effusions have resolved. The peribronchovascular nodularity is still present but appears slightly improved. This is likely lymphoma involvement of the lungs. ABDOMEN/PELVIS Persistent marked wall thickening and hypermetabolism involving the stomach. SUV max is 20.7 and was previously 9.3. The adenopathy in the region of the lesser sac has decreased in size since the prior study with maximum diameter of 19 mm versus 40 mm. Metabolic activity is again demonstrated with SUV max of 10.9. This was previously 20. Extensive adenopathy in the mesenteric a is again demonstrated but is much improved with decrease in size and metabolic activity. The bilateral inguinal lymph nodes are unchanged. SKELETON New hypermetabolic focus in the sternum has an SUV max of 5.8 and likely reflects osseous involvement. The right rib lesion is no longer hypermetabolic. A new left ninth rib lesion is noted. SUV max is 5.0. IMPRESSION: 1. Persistent diffuse disease but improved since the prior examination as specifically described above. The adenopathy is smaller and mostly less hypermetabolic in the neck, chest, abdomen and pelvis. 2. New sternal lesion and left ninth rib lesion. 3. Resolution of pleural effusions. Persistent but improved lung involvement. Electronically Signed   By: Marijo Sanes M.D.   On: 12/30/2015 12:12    ECHO: 11/01/2015: Study Conclusions  - Left ventricle: The cavity size was normal. There was mild concentric hypertrophy. Systolic function was normal. The estimated ejection fraction was in the range of 50% to 55%. Wall motion was normal; there were no regional wall motion abnormalities. Doppler parameters are consistent with abnormal left ventricular relaxation (grade 1 diastolic dysfunction). - Aortic valve: Transvalvular velocity was within the normal range. There  was no stenosis. There was no regurgitation. - Mitral valve: There was no regurgitation. - Right ventricle: The cavity size was normal. Wall thickness was normal. Systolic function was normal. - Tricuspid valve: There was no regurgitation. - Inferior vena cava: The vessel was normal in size. The respirophasic diameter changes were in the normal range (>= 50%), consistent with normal central venous pressure.Study Conclusions  - Left ventricle: The cavity size was normal. There was mild concentric hypertrophy. Systolic function  was normal. The estimated ejection fraction was in the range of 50% to 55%. Wall motion was normal; there were no regional wall motion abnormalities. Doppler parameters are consistent with abnormal left ventricular relaxation (grade 1 diastolic dysfunction). - Aortic valve: Transvalvular velocity was within the normal range. There was no stenosis. There was no regurgitation. - Mitral valve: There was no regurgitation. - Right ventricle: The cavity size was normal. Wall thickness was normal. Systolic function was normal. - Tricuspid valve: There was no regurgitation. - Inferior vena cava: The vessel was normal in size. The respirophasic diameter changes were in the normal range (>= 50%), consistent with normal central venous pressure.    ASSESSMENT & PLAN:    60 yo man with   1) Stage IVB E Mantle Cell lymphoma with likely gastric involvement, extensive LNadenopathy. 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  Patient is status post 3 cycles of R CHOP which he has tolerated well without any acute concerns. Abdominal distention has significantly improved and he is eating much better. He has gained 4 pounds since his last clinic visit . LDH levels have completely normalized on last check.pending  levels today   PET/CT scan after 3 cycles of R CHOP show good overall response. Stomach lesion still with very FDG uptake ? Residual lymphoma versus inflammation.   Kristopher Kitchen2) Retinitis pigmentosa causing legal blindness - Has been given a referral to a retina specialist and has an upcoming appointment.  3) Weight loss and protein calorie malnutrition. He is eating well and has gained 4 pounds since his last appointment . Wt Readings from Last 3 Encounters:  01/09/16 183 lb 4.8 oz (83.144 kg)  12/19/15 179 lb 12.8 oz (81.557 kg)  11/15/15 169 lb 12.8 oz (77.021 kg)    4) multiple scab like/nodular skin lesions ? Due to pruritus and scratching with likely skin involvement by lymphoma. He appears to keep getting them and there is concern that he is causing infections due to scratching. They seem to have cleared up significantly with his last cycle of treatment and doxycycline .he still hasn't gotten appointment with the dermatologist .MRSA swab was negative.  Plan  -Acyclovir prophylaxis. -no prohibitive toxicities labs stable. Patient appropriate to proceed with 4th cycle of R CHOP. -blood counts stable. Not neutropenic at this time. We are watching for this to determine if he might need G-CSF with future cycles. -planning for R-CHOP x 6 followed by Rituxan maintenance if CR achieved.  -If evidence of further progression might need to switch to be BR. -recounselled on need for good po food intake and aggressive hydration. -monitor for concerns of UGI bleeding with gastric involvement. -Patient needs to establish a PCP - reinforced this again. -Given a referral to dermatology for evaluation for skin lesions and consideration of skin biopsy - still awaiting appointment  -Peridex mouthwash  for periodontal disease.dental referral given. -He has an upcoming appointment with the retina specialist for continued management of his retinitis pigmentosa at the end of April.  Return to care with Dr. Irene Limbo  in 3 weeks on the day of his next planned chemotherapy.  All of the patients and his daughters questions were answered to his  apparent satisfaction. The patient knows to call the clinic with any problems, questions or concerns.  I spent 25 minutes counseling the patient face to face. The total time spent  in the appointment was 25 minutes and more than 50% was on counseling and direct patient cares.    Sullivan Lone MD [place] AAHIVMS [facility] Hematology/Oncology Physician [facility]  (Office):       [phone] (Work cell):  [phone] (Fax):           [phone]

## 2016-01-10 ENCOUNTER — Other Ambulatory Visit: Payer: Self-pay | Admitting: *Deleted

## 2016-01-10 ENCOUNTER — Telehealth: Payer: Self-pay | Admitting: Hematology

## 2016-01-10 ENCOUNTER — Telehealth: Payer: Self-pay | Admitting: *Deleted

## 2016-01-10 NOTE — Telephone Encounter (Signed)
spokw with patient to confirm 4/24 appt date/time

## 2016-01-10 NOTE — Telephone Encounter (Signed)
POF sent for lab/MD visit/infusion

## 2016-01-10 NOTE — Telephone Encounter (Signed)
Patient and male called reporting he left yesterday without receiving his next treatment appointment.  Called [facility] scheduler.  Received voicemail.  Message left on ext [phone] with this request.

## 2016-01-10 NOTE — Telephone Encounter (Signed)
Received message from patient/family and the triage RN regarding this patient's appts. No POF in computer to schedule from. Left message with desk RN.  JMW

## 2016-01-11 ENCOUNTER — Other Ambulatory Visit: Payer: Self-pay | Admitting: Hematology

## 2016-01-30 ENCOUNTER — Other Ambulatory Visit (HOSPITAL_BASED_OUTPATIENT_CLINIC_OR_DEPARTMENT_OTHER): Payer: Medicare Other

## 2016-01-30 ENCOUNTER — Ambulatory Visit (HOSPITAL_BASED_OUTPATIENT_CLINIC_OR_DEPARTMENT_OTHER): Payer: Medicare Other

## 2016-01-30 ENCOUNTER — Encounter: Payer: Self-pay | Admitting: Hematology

## 2016-01-30 ENCOUNTER — Ambulatory Visit (HOSPITAL_BASED_OUTPATIENT_CLINIC_OR_DEPARTMENT_OTHER): Payer: Medicare Other | Admitting: Hematology

## 2016-01-30 ENCOUNTER — Ambulatory Visit: Payer: Medicare Other

## 2016-01-30 VITALS — BP 109/68 | HR 74 | Temp 97.9°F | Resp 18 | Ht 69.0 in | Wt 183.9 lb

## 2016-01-30 VITALS — BP 113/60 | HR 81 | Temp 98.1°F | Resp 18

## 2016-01-30 DIAGNOSIS — R21 Rash and other nonspecific skin eruption: Secondary | ICD-10-CM

## 2016-01-30 DIAGNOSIS — C8318 Mantle cell lymphoma, lymph nodes of multiple sites: Secondary | ICD-10-CM

## 2016-01-30 DIAGNOSIS — H3552 Pigmentary retinal dystrophy: Secondary | ICD-10-CM | POA: Diagnosis not present

## 2016-01-30 DIAGNOSIS — L989 Disorder of the skin and subcutaneous tissue, unspecified: Secondary | ICD-10-CM

## 2016-01-30 DIAGNOSIS — Z5112 Encounter for antineoplastic immunotherapy: Secondary | ICD-10-CM | POA: Diagnosis present

## 2016-01-30 DIAGNOSIS — Z95828 Presence of other vascular implants and grafts: Secondary | ICD-10-CM

## 2016-01-30 DIAGNOSIS — Z5111 Encounter for antineoplastic chemotherapy: Secondary | ICD-10-CM | POA: Diagnosis not present

## 2016-01-30 LAB — COMPREHENSIVE METABOLIC PANEL
ALBUMIN: 3.6 g/dL (ref 3.5–5.0)
ALK PHOS: 56 U/L (ref 40–150)
ALT: 12 U/L (ref 0–55)
ANION GAP: 7 meq/L (ref 3–11)
AST: 15 U/L (ref 5–34)
BUN: 14 mg/dL (ref 7.0–26.0)
CO2: 28 mEq/L (ref 22–29)
Calcium: 9.6 mg/dL (ref 8.4–10.4)
Chloride: 106 mEq/L (ref 98–109)
Creatinine: 0.9 mg/dL (ref 0.7–1.3)
Glucose: 143 mg/dl — ABNORMAL HIGH (ref 70–140)
POTASSIUM: 3.8 meq/L (ref 3.5–5.1)
SODIUM: 141 meq/L (ref 136–145)
TOTAL PROTEIN: 6.5 g/dL (ref 6.4–8.3)

## 2016-01-30 LAB — CBC & DIFF AND RETIC
BASO%: 1.5 % (ref 0.0–2.0)
Basophils Absolute: 0.1 10*3/uL (ref 0.0–0.1)
EOS%: 3.6 % (ref 0.0–7.0)
Eosinophils Absolute: 0.2 10*3/uL (ref 0.0–0.5)
HCT: 39.7 % (ref 38.4–49.9)
HEMOGLOBIN: 12.5 g/dL — AB (ref 13.0–17.1)
IMMATURE RETIC FRACT: 11 % — AB (ref 3.00–10.60)
LYMPH%: 23.7 % (ref 14.0–49.0)
MCH: 27.9 pg (ref 27.2–33.4)
MCHC: 31.5 g/dL — ABNORMAL LOW (ref 32.0–36.0)
MCV: 88.6 fL (ref 79.3–98.0)
MONO#: 0.6 10*3/uL (ref 0.1–0.9)
MONO%: 9.5 % (ref 0.0–14.0)
NEUT%: 61.7 % (ref 39.0–75.0)
NEUTROS ABS: 3.7 10*3/uL (ref 1.5–6.5)
PLATELETS: 195 10*3/uL (ref 140–400)
RBC: 4.48 10*6/uL (ref 4.20–5.82)
RDW: 19 % — ABNORMAL HIGH (ref 11.0–14.6)
Retic %: 1.47 % (ref 0.80–1.80)
Retic Ct Abs: 65.86 10*3/uL (ref 34.80–93.90)
WBC: 5.9 10*3/uL (ref 4.0–10.3)
lymph#: 1.4 10*3/uL (ref 0.9–3.3)

## 2016-01-30 LAB — LACTATE DEHYDROGENASE: LDH: 133 U/L (ref 125–245)

## 2016-01-30 MED ORDER — SODIUM CHLORIDE 0.9% FLUSH
10.0000 mL | INTRAVENOUS | Status: DC | PRN
  Administered 2016-01-30: 10 mL
  Filled 2016-01-30: qty 10

## 2016-01-30 MED ORDER — DOXORUBICIN HCL CHEMO IV INJECTION 2 MG/ML
50.0000 mg/m2 | Freq: Once | INTRAVENOUS | Status: AC
  Administered 2016-01-30: 100 mg via INTRAVENOUS
  Filled 2016-01-30: qty 50

## 2016-01-30 MED ORDER — HEPARIN SOD (PORK) LOCK FLUSH 100 UNIT/ML IV SOLN
500.0000 [IU] | Freq: Once | INTRAVENOUS | Status: AC | PRN
  Administered 2016-01-30: 500 [IU]
  Filled 2016-01-30: qty 5

## 2016-01-30 MED ORDER — ACETAMINOPHEN 325 MG PO TABS
650.0000 mg | ORAL_TABLET | Freq: Once | ORAL | Status: AC
  Administered 2016-01-30: 650 mg via ORAL

## 2016-01-30 MED ORDER — VINCRISTINE SULFATE CHEMO INJECTION 1 MG/ML
2.0000 mg | Freq: Once | INTRAVENOUS | Status: AC
  Administered 2016-01-30: 2 mg via INTRAVENOUS
  Filled 2016-01-30: qty 2

## 2016-01-30 MED ORDER — SODIUM CHLORIDE 0.9 % IJ SOLN
10.0000 mL | INTRAMUSCULAR | Status: DC | PRN
  Administered 2016-01-30: 10 mL via INTRAVENOUS
  Filled 2016-01-30: qty 10

## 2016-01-30 MED ORDER — DIPHENHYDRAMINE HCL 25 MG PO CAPS
50.0000 mg | ORAL_CAPSULE | Freq: Once | ORAL | Status: AC
  Administered 2016-01-30: 50 mg via ORAL

## 2016-01-30 MED ORDER — SODIUM CHLORIDE 0.9 % IV SOLN: INTRAVENOUS | Status: AC

## 2016-01-30 MED ORDER — CYCLOPHOSPHAMIDE CHEMO INJECTION 1 GM
750.0000 mg/m2 | Freq: Once | INTRAMUSCULAR | Status: AC
  Administered 2016-01-30: 1500 mg via INTRAVENOUS
  Filled 2016-01-30: qty 75

## 2016-01-30 MED ORDER — SODIUM CHLORIDE 0.9 % IV SOLN: Freq: Once | INTRAVENOUS | Status: DC

## 2016-01-30 MED ORDER — SODIUM CHLORIDE 0.9 % IV SOLN
375.0000 mg/m2 | Freq: Once | INTRAVENOUS | Status: AC
  Administered 2016-01-30: 800 mg via INTRAVENOUS
  Filled 2016-01-30: qty 70

## 2016-01-30 MED ORDER — PALONOSETRON HCL INJECTION 0.25 MG/5ML
0.2500 mg | Freq: Once | INTRAVENOUS | Status: AC
Start: 2016-01-30 — End: 2016-01-30
  Administered 2016-01-30: 0.25 mg via INTRAVENOUS

## 2016-01-30 MED ORDER — SODIUM CHLORIDE 0.9 % IV SOLN
10.0000 mg | Freq: Once | INTRAVENOUS | Status: AC
  Administered 2016-01-30: 10 mg via INTRAVENOUS
  Filled 2016-01-30: qty 1

## 2016-01-30 MED ORDER — DIPHENHYDRAMINE HCL 25 MG PO CAPS
ORAL_CAPSULE | ORAL | Status: AC
  Filled 2016-01-30: qty 2

## 2016-01-30 MED ORDER — PALONOSETRON HCL INJECTION 0.25 MG/5ML
INTRAVENOUS | Status: AC
  Filled 2016-01-30: qty 5

## 2016-01-30 MED ORDER — SODIUM CHLORIDE 0.9 % IV SOLN
Freq: Once | INTRAVENOUS | Status: AC
  Administered 2016-01-30: 10:00:00 via INTRAVENOUS

## 2016-01-30 MED ORDER — ACETAMINOPHEN 325 MG PO TABS
ORAL_TABLET | ORAL | Status: AC
  Filled 2016-01-30: qty 2

## 2016-01-30 NOTE — Progress Notes (Signed)
[name] Kitchen    HEMATOLOGY/ONCOLOGY CLINIC NOTE  Date of Service: .01/30/2016   Patient Care Team: No Pcp Per Patient as PCP - General (General Practice)  CHIEF COMPLAINTS/PURPOSE OF CONSULTATION:   followup for New Diagnosed Mantle Cell lymphoma  DIAGNOSIS 1) Stage IV Mantle cell lymphoma  Current TREATMENT  Status post R CHOP 4 cycles  PLANNED RX  R-CHOP 6 cycles planned followed by Rituxan maintenance. Patient chooses not to pursue AutoHSCT based strategies which is quite reasonable considering his functional challenges with legal blindness and limited social support.   HISTORY OF PRESENTING ILLNESS: plz see clinic note for details on initial presentation  Interval History  Kristopher Johnson is here for follow-up prior to his fifth cycle of R CHOP chemotherapy. He notes no acute new concerns other than some mild grade 1 fatigue. Has been quite physically active. Has been eating well. His weight has improved from 169 to 183.9 pounds since starting treatment. No fevers chills night sweats -these have all resolved. His skin lesions have nearly resolved as well    MEDICAL HISTORY:  Past Medical History  Diagnosis Date  . Retinitis pigmentosa     Patient notes that he has been declared legally blind since 1983 and is on Social Security disability  . Abscess of arm, right 10/27/2015  . Lymphoma of lymph nodes of multiple sites ([place]) 10/27/2015  . Shortness of breath 10/27/2015    SURGICAL HISTORY: Past Surgical History  Procedure Laterality Date  . Cataract surgery      Patient notes she has had bilateral Surgery in 1998 and 99  . Tonsillectomy      about 60years old    SOCIAL HISTORY: Social History   Social History  . Marital Status: Widowed    Spouse Name: N/A  . Number of Children: N/A  . Years of Education: N/A   Occupational History  . Not on file.   Social History Main Topics  . Smoking status: Current Every Day Smoker -- 1.00 packs/day for 33 years  . Smokeless  tobacco: Never Used  . Alcohol Use: No  . Drug Use: No  . Sexual Activity: Not on file     Comment: Disabled, retinis pigmentosa legally blind, 4children MPOA Lynnn(cousin)   Other Topics Concern  . Not on file   Social History Narrative    FAMILY HISTORY: History reviewed. No pertinent family history.  ALLERGIES:  has No Known Allergies.  MEDICATIONS:  Current Outpatient Prescriptions  Medication Sig Dispense Refill  . acyclovir (ZOVIRAX) 400 MG tablet Take 1 tablet (400 mg total) by mouth 2 (two) times daily. 60 tablet 3  . chlorhexidine (PERIDEX) 0.12 % solution Use as directed 15 mLs in the mouth or throat 4 (four) times daily. 240 mL 2  . dexamethasone (DECADRON) 4 MG tablet Take 1 tablet (4 mg total) by mouth 2 (two) times daily with breakfast and lunch. 60 tablet 0  . lidocaine-prilocaine (EMLA) cream Apply 1 application topically as needed. 30 g 6  . ondansetron (ZOFRAN) 8 MG tablet Take 1 tablet (8 mg total) by mouth 2 (two) times daily as needed for refractory nausea / vomiting. (Patient not taking: Reported on 11/07/2015) 30 tablet 1  . prochlorperazine (COMPAZINE) 10 MG tablet Take 1 tablet (10 mg total) by mouth every 6 (six) hours as needed (Nausea or vomiting). (Patient not taking: Reported on 11/07/2015) 30 tablet 6   No current facility-administered medications for this visit.    REVIEW OF SYSTEMS:    10  Point review of Systems was done is negative except as noted above.  PHYSICAL EXAMINATION: ECOG PERFORMANCE STATUS: 2 - Symptomatic, <50% confined to bed  . There were no vitals filed for this visit. There were no vitals filed for this visit. .There is no weight on file to calculate BMI.  GENERAL:alert, in no acute distress and comfortable SKIN: some nodular skin lesions over extremities and back, beginning crusted lesions over his right scapular area. EYES: normal, conjunctiva are pink and non-injected, sclera clear. Poor visual acuity - some directional  light perception but difficulty with finger counting. OROPHARYNX:no exudate, no erythema and lips, multiple carious teeth with significant periodontal disease. NECK: supple, no JVD, thyroid normal size, non-tender, without nodularity LYMPH:  no palpable lymphadenopathy in the cervical, axillary or inguinal LUNGS: clear to auscultation with normal respiratory effort HEART: regular rate & rhythm,  no murmurs and no lower extremity edema ABDOMEN: abdomen soft, non-tender, normoactive bowel sounds  Musculoskeletal: no cyanosis of digits and no clubbing  PSYCH: alert & oriented x 3 with fluent speech NEURO: no focal motor/sensory deficits  LABORATORY DATA:  I have reviewed the data as listed  . CBC Latest Ref Rng 01/30/2016 01/09/2016 12/19/2015  WBC 4.0 - 10.3 10e3/uL 5.9 9.0 8.7  Hemoglobin 13.0 - 17.1 g/dL 12.5(L) 12.3(L) 12.4(L)  Hematocrit 38.4 - 49.9 % 39.7 39.8 40.0  Platelets 140 - 400 10e3/uL 195 243 237    . CMP Latest Ref Rng 01/09/2016 12/19/2015 11/28/2015  Glucose 70 - 140 mg/dl 118 105 180(H)  BUN 7.0 - 26.0 mg/dL 11.0 9.9 12.1  Creatinine 0.7 - 1.3 mg/dL 0.9 0.9 1.0  Sodium 136 - 145 mEq/L 142 139 138  Potassium 3.5 - 5.1 mEq/L 3.8 4.0 4.6  CO2 22 - 29 mEq/L 28 27 27   Calcium 8.4 - 10.4 mg/dL 9.4 9.2 9.3  Total Protein 6.4 - 8.3 g/dL 6.6 6.5 6.6  Total Bilirubin 0.20 - 1.20 mg/dL <0.30 <0.30 <0.30  Alkaline Phos 40 - 150 U/L 61 78 77  AST 5 - 34 U/L 14 12 12   ALT 0 - 55 U/L 14 11 15    Component     Latest Ref Rng 10/25/2015 11/01/2015  HIV     Non Reactive Non Reactive   HCV Ab     0.0 - 0.9 s/co ratio 0.2   Hepatitis B Surface Ag     Negative Negative   Hep B S Ab      Non Reactive   Comment      Comment   LDH     125 - 245 U/L  441 (H)   . Lab Results  Component Value Date   LDH 150 01/09/2016    Uric acid 7.1   RADIOGRAPHIC STUDIES: I have personally reviewed the radiological images as listed and agreed with the findings in the report. No results  found.  ECHO: 11/01/2015: Study Conclusions  - Left ventricle: The cavity size was normal. There was mild concentric hypertrophy. Systolic function was normal. The estimated ejection fraction was in the range of 50% to 55%. Wall motion was normal; there were no regional wall motion abnormalities. Doppler parameters are consistent with abnormal left ventricular relaxation (grade 1 diastolic dysfunction). - Aortic valve: Transvalvular velocity was within the normal range. There was no stenosis. There was no regurgitation. - Mitral valve: There was no regurgitation. - Right ventricle: The cavity size was normal. Wall thickness was normal. Systolic function was normal. - Tricuspid valve: There was no regurgitation. -  Inferior vena cava: The vessel was normal in size. The respirophasic diameter changes were in the normal range (>= 50%), consistent with normal central venous pressure.Study Conclusions  - Left ventricle: The cavity size was normal. There was mild concentric hypertrophy. Systolic function was normal. The estimated ejection fraction was in the range of 50% to 55%. Wall motion was normal; there were no regional wall motion abnormalities. Doppler parameters are consistent with abnormal left ventricular relaxation (grade 1 diastolic dysfunction). - Aortic valve: Transvalvular velocity was within the normal range. There was no stenosis. There was no regurgitation. - Mitral valve: There was no regurgitation. - Right ventricle: The cavity size was normal. Wall thickness was normal. Systolic function was normal. - Tricuspid valve: There was no regurgitation. - Inferior vena cava: The vessel was normal in size. The respirophasic diameter changes were in the normal range (>= 50%), consistent with normal central venous pressure.    ASSESSMENT & PLAN:    60 yo man with   1) Stage IVB E Mantle Cell lymphoma with likely gastric involvement,  extensive LNadenopathy. 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  Patient is status post 4 cycles of R CHOP which he has tolerated well without any acute concerns. Abdominal distention has significantly improved and he is eating much better. He has gained 4 pounds since his last clinic visit . LDH levels have completely normalized on last check.pending levels today   PET/CT scan after 3 cycles of R CHOP show good overall response. Stomach lesion still with very FDG uptake ? Residual lymphoma versus inflammation.   [name] Kitchen2) Retinitis pigmentosa causing legal blindness - Has been given a referral to a retina specialist and has an upcoming appointment this Friday.  3) Weight loss and protein calorie malnutrition. He is eating well and has gained 4 pounds since his last appointment . Wt Readings from Last 3 Encounters:  01/30/16 183 lb 14.4 oz (83.416 kg)  01/09/16 183 lb 4.8 oz (83.144 kg)  12/19/15 179 lb 12.8 oz (81.557 kg)    4) multiple scab like/nodular skin lesions ? Due to pruritus and scratching with likely skin involvement by lymphoma. He appears to keep getting them and there is concern that he is causing infections due to scratching. They seem to have cleared up significantly with his last cycle of treatment and doxycycline .he still hasn't gotten appointment with the dermatologist .MRSA swab was negative.  Skin lesions on visit today have nearly resolved. Plan  -Acyclovir prophylaxis. -no prohibitive toxicities labs stable. No clinical evidence of disease progression at this time. Patient appropriate to proceed with 5th cycle of R CHOP. -blood counts stable. Not neutropenic at this time. We are watching for this to determine if he might need G-CSF with future cycles. -Would plan to repeat PET/CT scan after his sixth cycle. -planning for  R-CHOP x 6 followed by Rituxan maintenance if CR achieved.  -If evidence of Residual disease or further progression would consider BR. -Continue optimal oral intake and aggressive hydration. -monitor for concerns of UGI bleeding with gastric involvement. -Patient needs to establish a PCP  -Peridex mouthwash  for periodontal disease.dental referral given. -He has an upcoming appointment with the retina specialist for continued management of his retinitis pigmentosa at the end of April.  Return to care with Dr. Irene Limbo in 3 weeks on the day of his next planned sixth  cycle of chemotherapy.  All of the patients and his daughters questions were answered to his  apparent satisfaction. The patient knows to call the clinic with any problems, questions or concerns.  I spent 25 minutes counseling the patient face to face. The total time spent in the appointment was 25 minutes and more than 50% was on counseling and direct patient cares.    Sullivan Lone MD [place] AAHIVMS [facility] Hematology/Oncology Physician Kristopher Johnson  (Office):       [phone] (Work cell):  [phone] (Fax):           [phone]

## 2016-01-30 NOTE — Patient Instructions (Signed)

## 2016-01-30 NOTE — Patient Instructions (Signed)
[facility] Discharge Instructions for Patients Receiving Chemotherapy  Today you received the following chemotherapy agents: Adriamycin, Vincristine, Rituxan, and Cytoxan.  To help prevent nausea and vomiting after your treatment, we encourage you to take your nausea medication   If you develop nausea and vomiting that is not controlled by your nausea medication, call the clinic.   BELOW ARE SYMPTOMS THAT SHOULD BE REPORTED IMMEDIATELY:  *FEVER GREATER THAN 100.5 F  *CHILLS WITH OR WITHOUT FEVER  NAUSEA AND VOMITING THAT IS NOT CONTROLLED WITH YOUR NAUSEA MEDICATION  *UNUSUAL SHORTNESS OF BREATH  *UNUSUAL BRUISING OR BLEEDING  TENDERNESS IN MOUTH AND THROAT WITH OR WITHOUT PRESENCE OF ULCERS  *URINARY PROBLEMS  *BOWEL PROBLEMS  UNUSUAL RASH Items with * indicate a potential emergency and should be followed up as soon as possible.  Feel free to call the clinic you have any questions or concerns. The clinic phone number is [phone].  Please show the [place] at check-in to the Emergency Department and triage nurse.

## 2016-02-03 DIAGNOSIS — H35342 Macular cyst, hole, or pseudohole, left eye: Secondary | ICD-10-CM | POA: Diagnosis not present

## 2016-02-03 DIAGNOSIS — H43813 Vitreous degeneration, bilateral: Secondary | ICD-10-CM | POA: Diagnosis not present

## 2016-02-03 DIAGNOSIS — H3552 Pigmentary retinal dystrophy: Secondary | ICD-10-CM | POA: Diagnosis not present

## 2016-02-20 ENCOUNTER — Telehealth: Payer: Self-pay | Admitting: Hematology

## 2016-02-20 ENCOUNTER — Other Ambulatory Visit (HOSPITAL_BASED_OUTPATIENT_CLINIC_OR_DEPARTMENT_OTHER): Payer: Medicare Other

## 2016-02-20 ENCOUNTER — Other Ambulatory Visit: Payer: Self-pay | Admitting: *Deleted

## 2016-02-20 ENCOUNTER — Ambulatory Visit (HOSPITAL_BASED_OUTPATIENT_CLINIC_OR_DEPARTMENT_OTHER): Payer: Medicare Other | Admitting: Hematology

## 2016-02-20 ENCOUNTER — Encounter: Payer: Self-pay | Admitting: Hematology

## 2016-02-20 ENCOUNTER — Ambulatory Visit: Payer: Medicare Other

## 2016-02-20 ENCOUNTER — Ambulatory Visit (HOSPITAL_BASED_OUTPATIENT_CLINIC_OR_DEPARTMENT_OTHER): Payer: Medicare Other

## 2016-02-20 VITALS — BP 116/73 | HR 69 | Temp 98.2°F | Resp 16

## 2016-02-20 VITALS — BP 133/77 | HR 66 | Temp 97.8°F | Resp 16

## 2016-02-20 DIAGNOSIS — C8318 Mantle cell lymphoma, lymph nodes of multiple sites: Secondary | ICD-10-CM | POA: Diagnosis not present

## 2016-02-20 DIAGNOSIS — H3552 Pigmentary retinal dystrophy: Secondary | ICD-10-CM

## 2016-02-20 DIAGNOSIS — L989 Disorder of the skin and subcutaneous tissue, unspecified: Secondary | ICD-10-CM | POA: Diagnosis not present

## 2016-02-20 DIAGNOSIS — Z5112 Encounter for antineoplastic immunotherapy: Secondary | ICD-10-CM

## 2016-02-20 DIAGNOSIS — Z95828 Presence of other vascular implants and grafts: Secondary | ICD-10-CM

## 2016-02-20 DIAGNOSIS — Z5111 Encounter for antineoplastic chemotherapy: Secondary | ICD-10-CM

## 2016-02-20 LAB — CBC & DIFF AND RETIC
BASO%: 0.8 % (ref 0.0–2.0)
Basophils Absolute: 0.1 10*3/uL (ref 0.0–0.1)
EOS%: 2.8 % (ref 0.0–7.0)
Eosinophils Absolute: 0.2 10*3/uL (ref 0.0–0.5)
HCT: 39.7 % (ref 38.4–49.9)
HGB: 12.6 g/dL — ABNORMAL LOW (ref 13.0–17.1)
IMMATURE RETIC FRACT: 11.7 % — AB (ref 3.00–10.60)
LYMPH%: 23.2 % (ref 14.0–49.0)
MCH: 28.4 pg (ref 27.2–33.4)
MCHC: 31.7 g/dL — ABNORMAL LOW (ref 32.0–36.0)
MCV: 89.4 fL (ref 79.3–98.0)
MONO#: 0.6 10*3/uL (ref 0.1–0.9)
MONO%: 9.4 % (ref 0.0–14.0)
NEUT%: 63.8 % (ref 39.0–75.0)
NEUTROS ABS: 4.1 10*3/uL (ref 1.5–6.5)
Platelets: 214 10*3/uL (ref 140–400)
RBC: 4.44 10*6/uL (ref 4.20–5.82)
RDW: 18.1 % — AB (ref 11.0–14.6)
RETIC %: 1.47 % (ref 0.80–1.80)
RETIC CT ABS: 65.27 10*3/uL (ref 34.80–93.90)
WBC: 6.4 10*3/uL (ref 4.0–10.3)
lymph#: 1.5 10*3/uL (ref 0.9–3.3)

## 2016-02-20 LAB — COMPREHENSIVE METABOLIC PANEL
ALT: 14 U/L (ref 0–55)
AST: 13 U/L (ref 5–34)
Albumin: 3.6 g/dL (ref 3.5–5.0)
Alkaline Phosphatase: 55 U/L (ref 40–150)
Anion Gap: 7 mEq/L (ref 3–11)
BUN: 15.1 mg/dL (ref 7.0–26.0)
CO2: 29 meq/L (ref 22–29)
CREATININE: 0.9 mg/dL (ref 0.7–1.3)
Calcium: 9.3 mg/dL (ref 8.4–10.4)
Chloride: 107 mEq/L (ref 98–109)
GLUCOSE: 103 mg/dL (ref 70–140)
Potassium: 3.9 mEq/L (ref 3.5–5.1)
SODIUM: 142 meq/L (ref 136–145)
TOTAL PROTEIN: 6.4 g/dL (ref 6.4–8.3)

## 2016-02-20 LAB — LACTATE DEHYDROGENASE: LDH: 135 U/L (ref 125–245)

## 2016-02-20 MED ORDER — SODIUM CHLORIDE 0.9 % IV SOLN
10.0000 mg | Freq: Once | INTRAVENOUS | Status: AC
  Administered 2016-02-20: 10 mg via INTRAVENOUS
  Filled 2016-02-20: qty 1

## 2016-02-20 MED ORDER — HEPARIN SOD (PORK) LOCK FLUSH 100 UNIT/ML IV SOLN
500.0000 [IU] | Freq: Once | INTRAVENOUS | Status: AC | PRN
  Administered 2016-02-20: 500 [IU]
  Filled 2016-02-20: qty 5

## 2016-02-20 MED ORDER — PALONOSETRON HCL INJECTION 0.25 MG/5ML
INTRAVENOUS | Status: AC
  Filled 2016-02-20: qty 5

## 2016-02-20 MED ORDER — SODIUM CHLORIDE 0.9 % IJ SOLN
10.0000 mL | INTRAMUSCULAR | Status: DC | PRN
  Administered 2016-02-20: 10 mL via INTRAVENOUS
  Filled 2016-02-20: qty 10

## 2016-02-20 MED ORDER — DOXORUBICIN HCL CHEMO IV INJECTION 2 MG/ML
50.0000 mg/m2 | Freq: Once | INTRAVENOUS | Status: AC
  Administered 2016-02-20: 100 mg via INTRAVENOUS
  Filled 2016-02-20: qty 50

## 2016-02-20 MED ORDER — PALONOSETRON HCL INJECTION 0.25 MG/5ML
0.2500 mg | Freq: Once | INTRAVENOUS | Status: AC
  Administered 2016-02-20: 0.25 mg via INTRAVENOUS

## 2016-02-20 MED ORDER — SODIUM CHLORIDE 0.9 % IV SOLN: Freq: Once | INTRAVENOUS | Status: DC

## 2016-02-20 MED ORDER — ACETAMINOPHEN 325 MG PO TABS
ORAL_TABLET | ORAL | Status: AC
  Filled 2016-02-20: qty 2

## 2016-02-20 MED ORDER — SODIUM CHLORIDE 0.9 % IV SOLN
375.0000 mg/m2 | Freq: Once | INTRAVENOUS | Status: AC
  Administered 2016-02-20: 800 mg via INTRAVENOUS
  Filled 2016-02-20: qty 70

## 2016-02-20 MED ORDER — SODIUM CHLORIDE 0.9% FLUSH
10.0000 mL | INTRAVENOUS | Status: DC | PRN
  Administered 2016-02-20: 10 mL
  Filled 2016-02-20: qty 10

## 2016-02-20 MED ORDER — DIPHENHYDRAMINE HCL 25 MG PO CAPS
ORAL_CAPSULE | ORAL | Status: AC
  Filled 2016-02-20: qty 2

## 2016-02-20 MED ORDER — DIPHENHYDRAMINE HCL 25 MG PO CAPS
50.0000 mg | ORAL_CAPSULE | Freq: Once | ORAL | Status: AC
  Administered 2016-02-20: 50 mg via ORAL

## 2016-02-20 MED ORDER — ACETAMINOPHEN 325 MG PO TABS
650.0000 mg | ORAL_TABLET | Freq: Once | ORAL | Status: AC
  Administered 2016-02-20: 650 mg via ORAL

## 2016-02-20 MED ORDER — SODIUM CHLORIDE 0.9 % IV SOLN
INTRAVENOUS | Status: AC
  Administered 2016-02-20: 11:00:00 via INTRAVENOUS

## 2016-02-20 MED ORDER — SODIUM CHLORIDE 0.9 % IV SOLN
750.0000 mg/m2 | Freq: Once | INTRAVENOUS | Status: AC
  Administered 2016-02-20: 1500 mg via INTRAVENOUS
  Filled 2016-02-20: qty 75

## 2016-02-20 MED ORDER — VINCRISTINE SULFATE CHEMO INJECTION 1 MG/ML
2.0000 mg | Freq: Once | INTRAVENOUS | Status: AC
  Administered 2016-02-20: 2 mg via INTRAVENOUS
  Filled 2016-02-20: qty 2

## 2016-02-20 NOTE — Progress Notes (Signed)
Kristopher Kitchen    HEMATOLOGY/ONCOLOGY CLINIC NOTE  Date of Service: 02/20/2016    Patient Care Team: No Pcp Per Patient as PCP - General (General Practice)  CHIEF COMPLAINTS/PURPOSE OF CONSULTATION:   followup for New Diagnosed Mantle Cell lymphoma  DIAGNOSIS 1) Stage IV Mantle cell lymphoma  Current TREATMENT  Status post R CHOP 5 cycles  PLANNED RX  R-CHOP 6 cycles planned followed by Rituxan maintenance. Patient chooses not to pursue AutoHSCT based strategies which is quite reasonable considering his functional challenges with legal blindness and limited social support.   HISTORY OF PRESENTING ILLNESS: plz see clinic note for details on initial presentation  Interval History  Kristopher Johnson is here for follow-up prior to his sixth cycle of R CHOP chemotherapy. He notes no acute new concerns other than some mild grade 1 fatigue. He notes that his weight remains stable. Eating well. Has been more physically active. No abdominal pain or distention. No lymphadenopathy. Skin rashes resolved. No pedal edema. No tingling or numbness in his hands or feet. No shortness of breath.   MEDICAL HISTORY:  Past Medical History  Diagnosis Date  . Retinitis pigmentosa     Patient notes that he has been declared legally blind since 1983 and is on Social Security disability  . Abscess of arm, right 10/27/2015  . Lymphoma of lymph nodes of multiple sites ([place]) 10/27/2015  . Shortness of breath 10/27/2015    SURGICAL HISTORY: Past Surgical History  Procedure Laterality Date  . Cataract surgery      Patient notes she has had bilateral Surgery in 1998 and 99  . Tonsillectomy      about 60years old    SOCIAL HISTORY: Social History   Social History  . Marital Status: Widowed    Spouse Name: N/A  . Number of Children: N/A  . Years of Education: N/A   Occupational History  . Not on file.   Social History Main Topics  . Smoking status: Current Every Day Smoker -- 1.00 packs/day for 33 years    . Smokeless tobacco: Never Used  . Alcohol Use: No  . Drug Use: No  . Sexual Activity: Not on file     Comment: Disabled, retinis pigmentosa legally blind, 4children MPOA Lynnn(cousin)   Other Topics Concern  . Not on file   Social History Narrative    FAMILY HISTORY: History reviewed. No pertinent family history.  ALLERGIES:  has No Known Allergies.  MEDICATIONS:  Current Outpatient Prescriptions  Medication Sig Dispense Refill  . acyclovir (ZOVIRAX) 400 MG tablet Take 1 tablet (400 mg total) by mouth 2 (two) times daily. 60 tablet 3  . chlorhexidine (PERIDEX) 0.12 % solution Use as directed 15 mLs in the mouth or throat 4 (four) times daily. 240 mL 2  . dexamethasone (DECADRON) 4 MG tablet Take 1 tablet (4 mg total) by mouth 2 (two) times daily with breakfast and lunch. 60 tablet 0  . lidocaine-prilocaine (EMLA) cream Apply 1 application topically as needed. 30 g 6  . ondansetron (ZOFRAN) 8 MG tablet Take 1 tablet (8 mg total) by mouth 2 (two) times daily as needed for refractory nausea / vomiting. 30 tablet 1  . prochlorperazine (COMPAZINE) 10 MG tablet Take 1 tablet (10 mg total) by mouth every 6 (six) hours as needed (Nausea or vomiting). 30 tablet 6   No current facility-administered medications for this visit.   Facility-Administered Medications Ordered in Other Visits  Medication Dose Route Frequency Provider Last Rate Last Dose  .  sodium chloride 0.9 % injection 10 mL  10 mL Intravenous PRN Brunetta Genera, MD   10 mL at 02/20/16 0955    REVIEW OF SYSTEMS:    10 Point review of Systems was done is negative except as noted above.  PHYSICAL EXAMINATION: ECOG PERFORMANCE STATUS: 2 - Symptomatic, <50% confined to bed  Vital signs reviewed in Epic- stable GENERAL:alert, in no acute distress and comfortable SKIN: some nodular skin lesions over extremities and back, beginning crusted lesions over his right scapular area. EYES: normal, conjunctiva are pink and  non-injected, sclera clear. Poor visual acuity - some directional light perception but difficulty with finger counting. OROPHARYNX:no exudate, no erythema and lips, multiple carious teeth with significant periodontal disease. NECK: supple, no JVD, thyroid normal size, non-tender, without nodularity LYMPH:  no palpable lymphadenopathy in the cervical, axillary or inguinal LUNGS: clear to auscultation with normal respiratory effort HEART: regular rate & rhythm,  no murmurs and no lower extremity edema ABDOMEN: abdomen soft, non-tender, normoactive bowel sounds  Musculoskeletal: no cyanosis of digits and no clubbing  PSYCH: alert & oriented x 3 with fluent speech NEURO: no focal motor/sensory deficits  LABORATORY DATA:  I have reviewed the data as listed   CBC Latest Ref Rng 02/20/2016 01/30/2016 01/09/2016  WBC 4.0 - 10.3 10e3/uL 6.4 5.9 9.0  Hemoglobin 13.0 - 17.1 g/dL 12.6(L) 12.5(L) 12.3(L)  Hematocrit 38.4 - 49.9 % 39.7 39.7 39.8  Platelets 140 - 400 10e3/uL 214 195 243    . CMP Latest Ref Rng 01/30/2016 01/09/2016 12/19/2015  Glucose 70 - 140 mg/dl 143(H) 118 105  BUN 7.0 - 26.0 mg/dL 14.0 11.0 9.9  Creatinine 0.7 - 1.3 mg/dL 0.9 0.9 0.9  Sodium 136 - 145 mEq/L 141 142 139  Potassium 3.5 - 5.1 mEq/L 3.8 3.8 4.0  CO2 22 - 29 mEq/L 28 28 27   Calcium 8.4 - 10.4 mg/dL 9.6 9.4 9.2  Total Protein 6.4 - 8.3 g/dL 6.5 6.6 6.5  Total Bilirubin 0.20 - 1.20 mg/dL <0.30 <0.30 <0.30  Alkaline Phos 40 - 150 U/L 56 61 78  AST 5 - 34 U/L 15 14 12   ALT 0 - 55 U/L 12 14 11    Component     Latest Ref Rng 10/25/2015 11/01/2015  HIV     Non Reactive Non Reactive   HCV Ab     0.0 - 0.9 s/co ratio 0.2   Hepatitis B Surface Ag     Negative Negative   Hep B S Ab      Non Reactive   Comment      Comment   LDH     125 - 245 U/L  441 (H)   . Lab Results  Component Value Date   LDH 133 01/30/2016    Uric acid 7.1   RADIOGRAPHIC STUDIES: I have personally reviewed the radiological images as  listed and agreed with the findings in the report. No results found.  ECHO: 11/01/2015: Study Conclusions  - Left ventricle: The cavity size was normal. There was mild concentric hypertrophy. Systolic function was normal. The estimated ejection fraction was in the range of 50% to 55%. Wall motion was normal; there were no regional wall motion abnormalities. Doppler parameters are consistent with abnormal left ventricular relaxation (grade 1 diastolic dysfunction). - Aortic valve: Transvalvular velocity was within the normal range. There was no stenosis. There was no regurgitation. - Mitral valve: There was no regurgitation. - Right ventricle: The cavity size was normal. Wall thickness was  normal. Systolic function was normal. - Tricuspid valve: There was no regurgitation. - Inferior vena cava: The vessel was normal in size. The respirophasic diameter changes were in the normal range (>= 50%), consistent with normal central venous pressure.Study Conclusions  - Left ventricle: The cavity size was normal. There was mild concentric hypertrophy. Systolic function was normal. The estimated ejection fraction was in the range of 50% to 55%. Wall motion was normal; there were no regional wall motion abnormalities. Doppler parameters are consistent with abnormal left ventricular relaxation (grade 1 diastolic dysfunction). - Aortic valve: Transvalvular velocity was within the normal range. There was no stenosis. There was no regurgitation. - Mitral valve: There was no regurgitation. - Right ventricle: The cavity size was normal. Wall thickness was normal. Systolic function was normal. - Tricuspid valve: There was no regurgitation. - Inferior vena cava: The vessel was normal in size. The respirophasic diameter changes were in the normal range (>= 50%), consistent with normal central venous pressure.    ASSESSMENT & PLAN:    60 yo man with   1) Stage  IVB E Mantle Cell lymphoma with likely gastric involvement, extensive LNadenopathy. ECHO nl EF ECOG PS 1-2 but activities significantly limited due to being legally blind HIV/Hep C/Hep B neg Baseline LDH 450--->133   PET/CT scan after 3 cycles of R CHOP show good overall response. Stomach lesion still with very FDG uptake ? Residual lymphoma versus inflammation.   Patient is status post 5 cycles of R CHOP which he has tolerated well without any acute concerns. His lymphadenopathy and abdominal distention have resolved. Skin rashes resolved. No constitutional symptoms at this time. No other acute new concerns at this time. Is here for follow-up prior to his sixth cycle of chemotherapy.  Kristopher Kitchen2) Retinitis pigmentosa causing legal blindness - has been set up and is seeing a retina specialist in town. No acute interventions at this time.  3) Weight loss and protein calorie malnutrition. Resolving. Weight has improved significantly since his diagnosis. He is eating well. . Wt Readings from Last 3 Encounters:  01/30/16 183 lb 14.4 oz (83.416 kg)  01/09/16 183 lb 4.8 oz (83.144 kg)  12/19/15 179 lb 12.8 oz (81.557 kg)    4) multiple scab like/nodular skin lesions ? Due to pruritus and scratching with likely skin involvement by lymphoma. He appears to keep getting them and there is concern that he is causing infections due to scratching. They seem to have cleared up significantly with his last cycle of treatment and doxycycline .he still hasn't gotten appointment with the dermatologist .MRSA swab was negative.  Skin lesions on visit today have nearly resolved. Plan  -Acyclovir prophylaxis. -no prohibitive toxicities labs stable. No clinical evidence of disease progression at this time. Patient appropriate to proceed with 6th cycle of R CHOP. -blood counts stable. Not neutropenic at this time.  -Repeat labs and PET/CT scan in 4 weeks to evaluate status of remission/response. -If patient is in CR  will proceed to maintenance Rituxan -Continue optimal oral intake and aggressive hydration. -Patient needs to establish a PCP -this was reinforced again. -Continue Peridex mouth washes and dental follow-up.   Return to care with Dr. Irene Limbo in 4 weeks with labs and PET/CT scan  I spent 20 minutes counseling the patient face to face. The total time spent in the appointment was 25 minutes and more than 50% was on counseling and direct patient cares.    Kristopher Lone MD [place] AAHIVMS [facility] Hematology/Oncology Physician [employer]  [place]  (Office):       [phone] (Work cell):  [phone] (Fax):           [phone]

## 2016-02-20 NOTE — Patient Instructions (Signed)
[facility] Discharge Instructions for Patients Receiving Chemotherapy  Today you received the following chemotherapy agents: Adriamycin, Vincristine, Rituxan, and Cytoxan.  To help prevent nausea and vomiting after your treatment, we encourage you to take your nausea medication   If you develop nausea and vomiting that is not controlled by your nausea medication, call the clinic.   BELOW ARE SYMPTOMS THAT SHOULD BE REPORTED IMMEDIATELY:  *FEVER GREATER THAN 100.5 F  *CHILLS WITH OR WITHOUT FEVER  NAUSEA AND VOMITING THAT IS NOT CONTROLLED WITH YOUR NAUSEA MEDICATION  *UNUSUAL SHORTNESS OF BREATH  *UNUSUAL BRUISING OR BLEEDING  TENDERNESS IN MOUTH AND THROAT WITH OR WITHOUT PRESENCE OF ULCERS  *URINARY PROBLEMS  *BOWEL PROBLEMS  UNUSUAL RASH Items with * indicate a potential emergency and should be followed up as soon as possible.  Feel free to call the clinic you have any questions or concerns. The clinic phone number is [phone].  Please show the [place] at check-in to the Emergency Department and triage nurse.

## 2016-02-20 NOTE — Telephone Encounter (Signed)
Pt will p/u apt in tx room

## 2016-02-20 NOTE — Patient Instructions (Signed)

## 2016-03-08 DIAGNOSIS — H3552 Pigmentary retinal dystrophy: Secondary | ICD-10-CM | POA: Diagnosis not present

## 2016-03-12 ENCOUNTER — Ambulatory Visit: Payer: Medicare Other

## 2016-03-12 ENCOUNTER — Other Ambulatory Visit: Payer: Medicare Other

## 2016-03-12 DIAGNOSIS — L308 Other specified dermatitis: Secondary | ICD-10-CM | POA: Diagnosis not present

## 2016-03-14 IMAGING — US US BIOPSY
1 series · 13 of 16 positions shown · non-contrast
Comparison: none

CLINICAL DATA: 59-year-old male with new diagnosis of profound
adenopathy concerning for advanced lymphoma. He presents for
ultrasound-guided core biopsy to obtain tissue diagnosis.

EXAM:
ULTRASOUND BIOPSY LYMPH NODE
Date: 10/25/2015
PROCEDURE:
1. Ultrasound-guided core biopsy of right superficial inguinal lymph
nodes
ANESTHESIA/SEDATION:
50 mcg fentanyl administered intravenously.
MEDICATIONS:
None additional
TECHNIQUE: Informed consent was obtained from the patient following explanation
of the procedure, risks, benefits and alternatives. The patient
understands, agrees and consents for the procedure. All questions
were addressed. A time out was performed.

[Series 1: us biopsy · 0.07mm/px · 16 acquisitions, 13 frames shown]
[im 1/16]
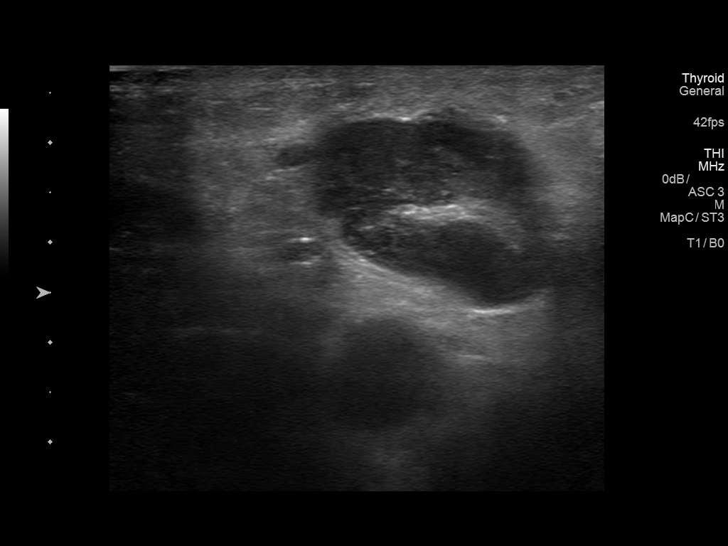
[im 2/16]
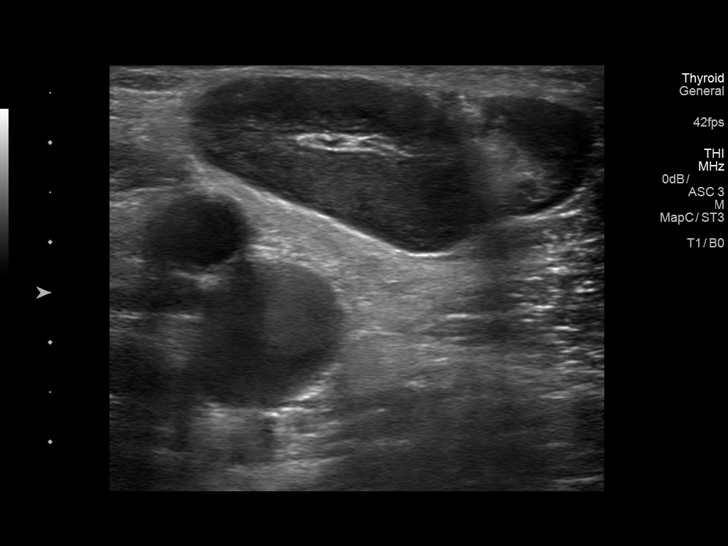
[im 4/16]
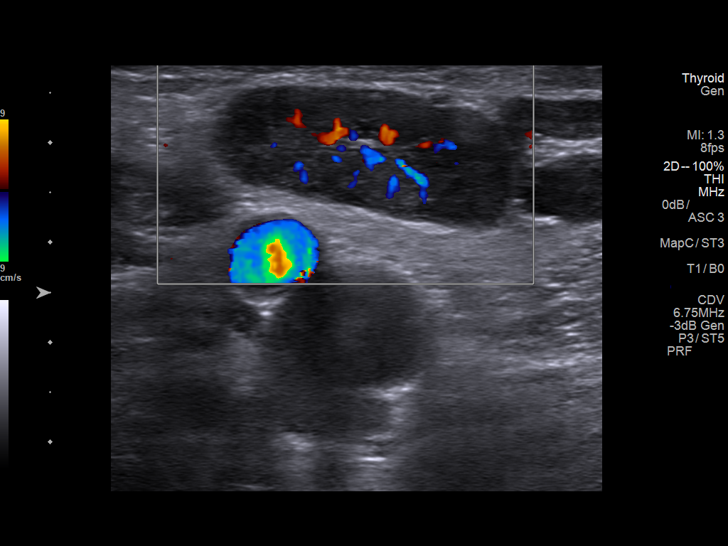
[im 5/16]
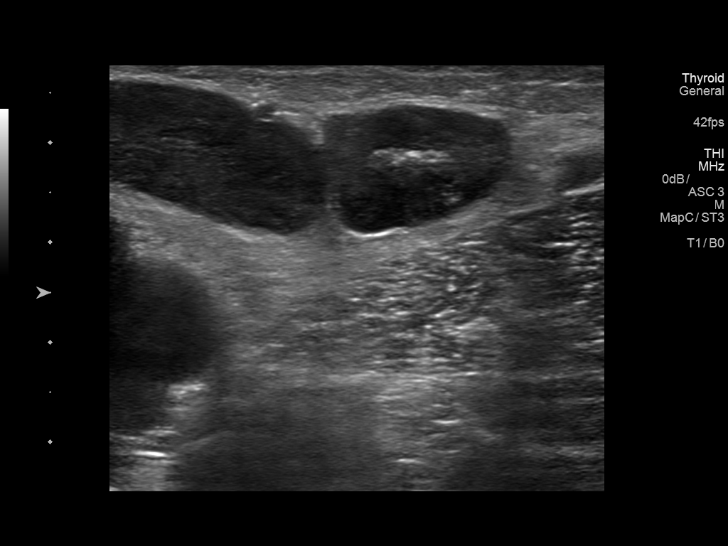
[im 6/16]
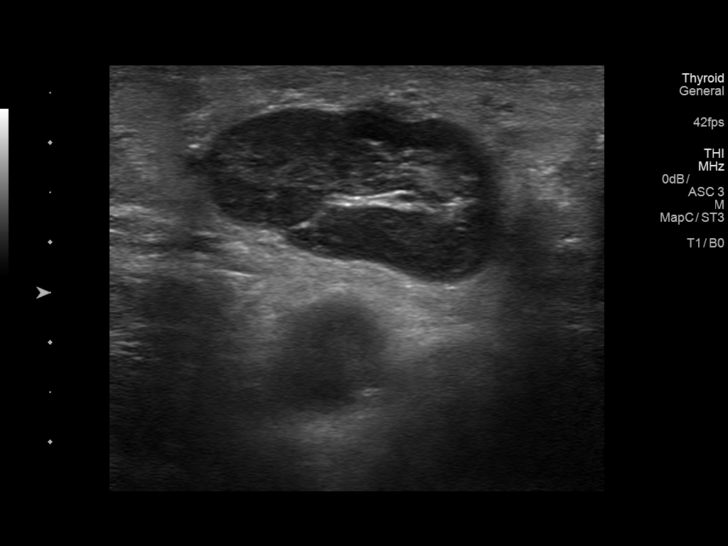
[im 7/16]
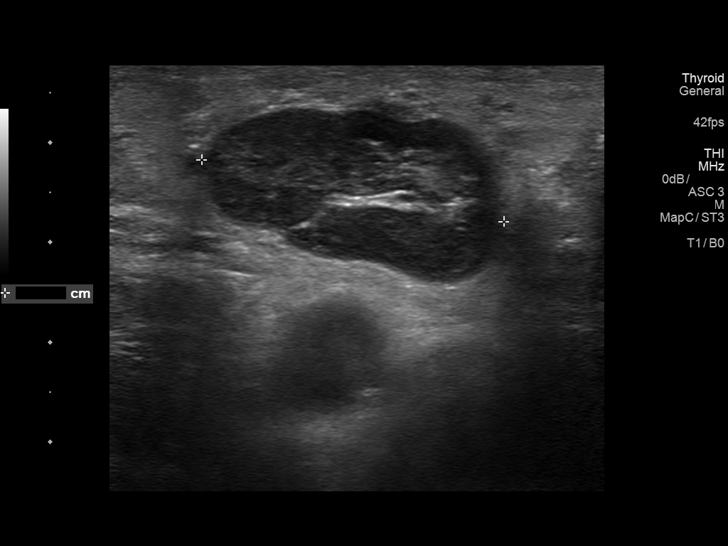
[im 9/16]
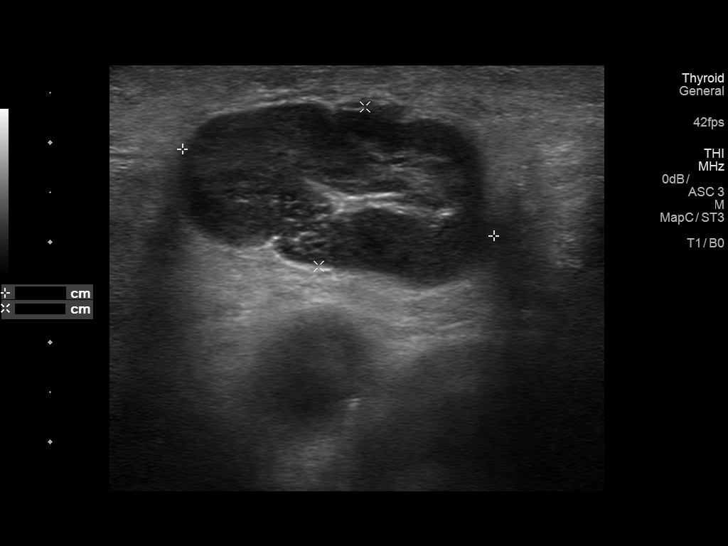
[im 10/16]
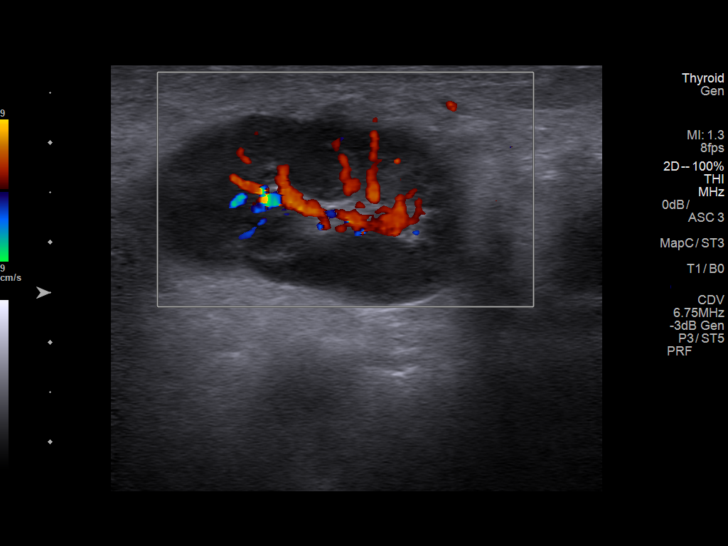
[im 11/16]
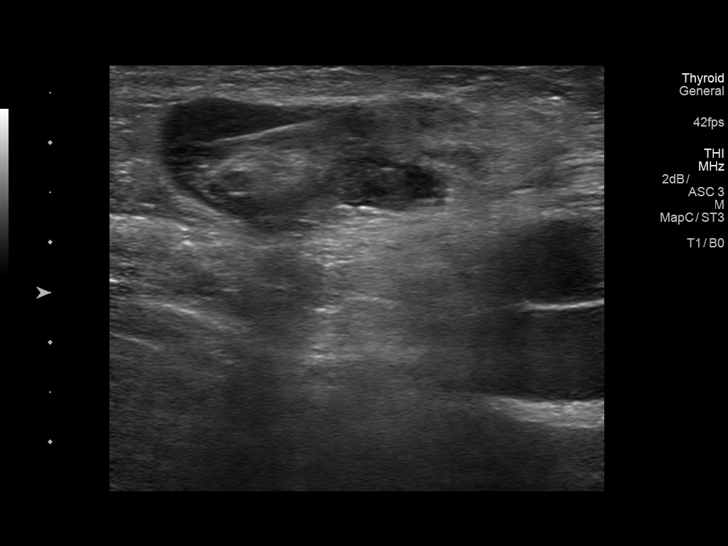
[im 12/16]
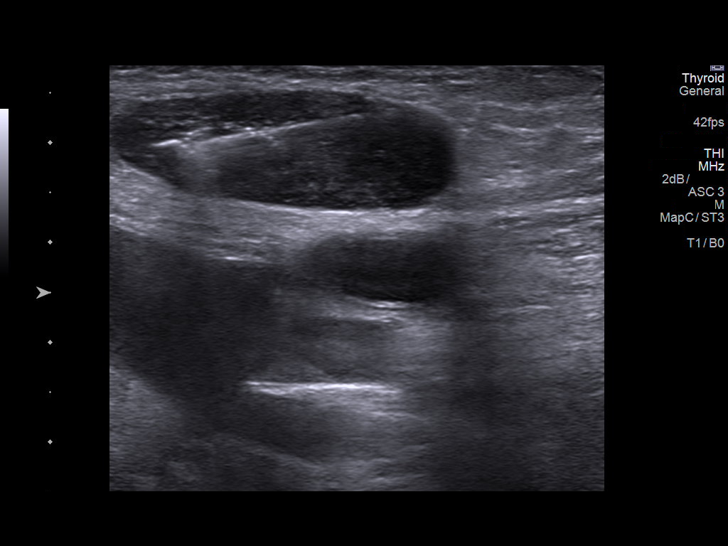
[im 13/16]
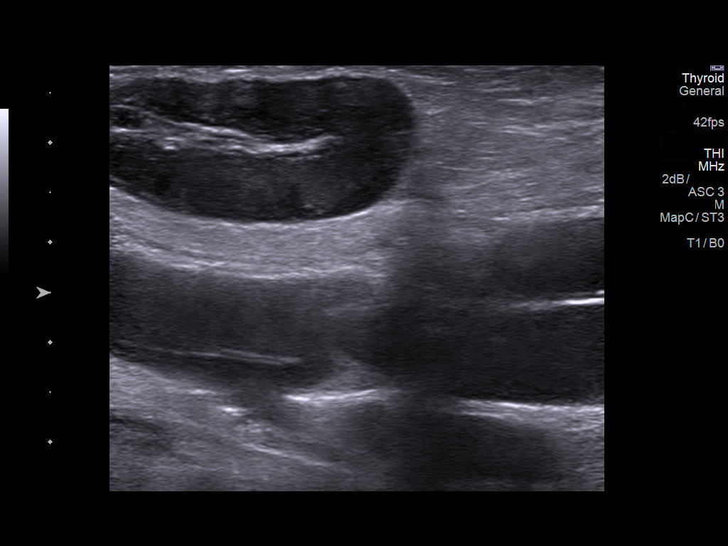
[im 15/16]
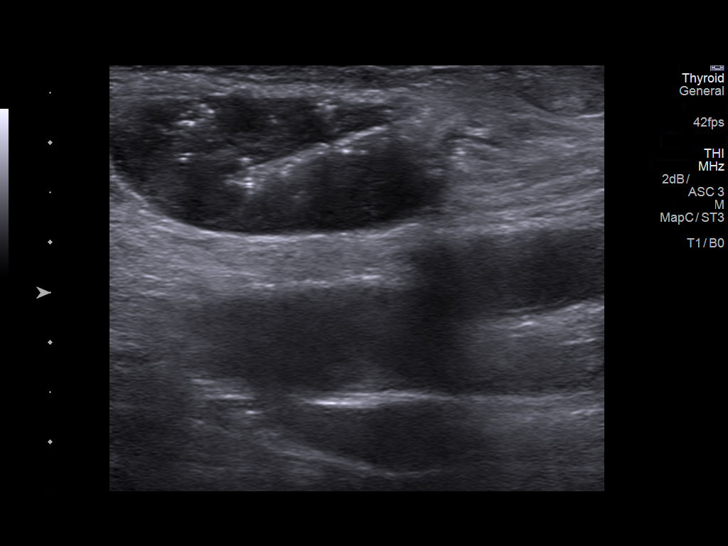
[im 16/16]
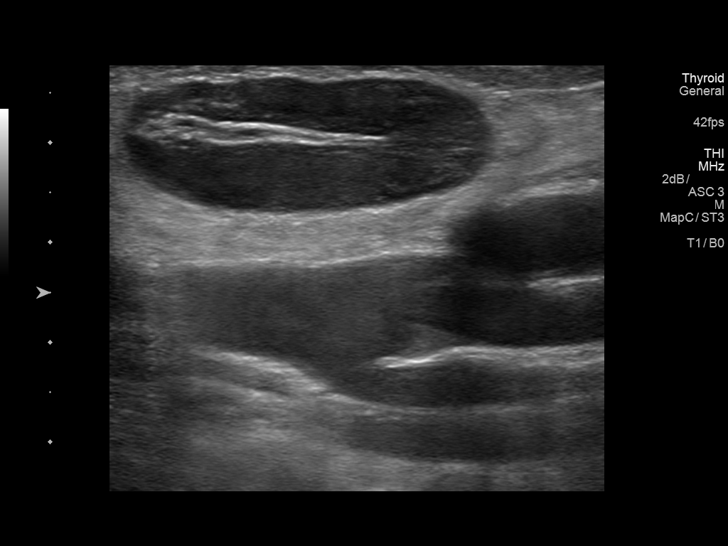

[13 of 16 positions shown; findings below may reference images not displayed]

The right groin was interrogated with ultrasound. Multiple enlarged
superficial inguinal lymph nodes are identified. The nodes are
superficial to the common femoral artery and vein. An appropriate
skin entry site was selected and marked. The region was sterilely
prepped and draped in standard fashion using chlorhexidine skin
prep.

Under real-time sonographic guidance, multiple 16 gauge core
biopsies were then coaxially obtained. Needle placement was
confirmed on all biopsy passes with real-time sonography. Biopsy
specimens were placed in saline and delivered to pathology for
further analysis.

Post biopsy ultrasound imaging demonstrates no active bleeding or
hematoma. The patient tolerated the procedure well.

COMPLICATIONS:
None.

Estimated blood loss: 0
IMPRESSION: Technically successful ultrasound-guided core biopsy of superficial
right inguinal adenopathy.

## 2016-03-16 ENCOUNTER — Ambulatory Visit (HOSPITAL_COMMUNITY)
Admission: RE | Admit: 2016-03-16 | Discharge: 2016-03-16 | Disposition: A | Discharge status: Home / Self Care | Payer: Medicare Other | Source: Ambulatory Visit | Attending: Hematology | Admitting: Hematology

## 2016-03-16 DIAGNOSIS — C8310 Mantle cell lymphoma, unspecified site: Secondary | ICD-10-CM | POA: Diagnosis not present

## 2016-03-16 DIAGNOSIS — R935 Abnormal findings on diagnostic imaging of other abdominal regions, including retroperitoneum: Secondary | ICD-10-CM | POA: Insufficient documentation

## 2016-03-16 DIAGNOSIS — C8318 Mantle cell lymphoma, lymph nodes of multiple sites: Secondary | ICD-10-CM | POA: Insufficient documentation

## 2016-03-16 LAB — GLUCOSE, CAPILLARY: GLUCOSE-CAPILLARY: 102 mg/dL — AB (ref 65–99)

## 2016-03-16 MED ORDER — FLUDEOXYGLUCOSE F - 18 (FDG) INJECTION
9.0000 | Freq: Once | INTRAVENOUS | Status: AC | PRN
  Administered 2016-03-16: 9 via INTRAVENOUS

## 2016-03-19 ENCOUNTER — Other Ambulatory Visit: Payer: Medicare Other

## 2016-03-19 ENCOUNTER — Telehealth: Payer: Self-pay | Admitting: Hematology

## 2016-03-19 ENCOUNTER — Ambulatory Visit (HOSPITAL_BASED_OUTPATIENT_CLINIC_OR_DEPARTMENT_OTHER): Payer: Medicare Other | Admitting: Hematology

## 2016-03-19 VITALS — BP 114/65 | HR 72 | Temp 97.7°F | Resp 18 | Ht 69.0 in | Wt 183.6 lb

## 2016-03-19 DIAGNOSIS — H109 Unspecified conjunctivitis: Secondary | ICD-10-CM | POA: Diagnosis not present

## 2016-03-19 DIAGNOSIS — C8318 Mantle cell lymphoma, lymph nodes of multiple sites: Secondary | ICD-10-CM

## 2016-03-19 DIAGNOSIS — H3552 Pigmentary retinal dystrophy: Secondary | ICD-10-CM

## 2016-03-19 NOTE — Telephone Encounter (Signed)
per pof tos ch pt appt-sent MW email to sch trmt-will call pt after reply °

## 2016-03-19 NOTE — Progress Notes (Signed)
Kristopher Johnson    HEMATOLOGY/ONCOLOGY CLINIC NOTE  Date of Service: 03/19/2016   Patient Care Team: No Pcp Per Patient as PCP - General (General Practice)  CHIEF COMPLAINTS/PURPOSE OF CONSULTATION:   followup for New Diagnosed Mantle Cell lymphoma  DIAGNOSIS 1) Stage IV Mantle cell lymphoma  Current TREATMENT   planned Rituxan maintenance  PREVIOUS TREATMENT  Status post R CHOP 6 cycles . Patient chooses not to pursue AutoHSCT based strategies which is quite reasonable considering his functional challenges with legal blindness and limited social support.   HISTORY OF PRESENTING ILLNESS: plz see clinic note for details on initial presentation  Interval History  Mr. Kristopher Johnson is here for follow-up of his mantle cell lymphoma after completing 6 cycles of R CHOP chemotherapy. He has had a therapy PET/CT scan on 03/16/2016 that shows resolution of his prior hypermetabolic lymphadenopathy. Gastric wall thickening has improved and gastric activity appears to be reactive physiologic levels. Patient reports that about 2-4 days after having received chemotherapy the last time he had a peri-orbital rash and conjunctival injection. He was seen by dermatologist and given a topical steroid ointment which has led to the resolution of his rash. He was told that they couldn't be sure think that it might be related to Rituxan. We discussed that it could possibly be the Rituxan or other chemotherapy meds but other factors might also be possible.  He has not had issues with the Rituxan previously. We discussed the findings of the PET/CT scan in details and further treatment plans. We discussed the pros and cons of using maintenance Rituxan every 2 months for 2 years and he wants to take this approach. We specifically discussed the ongoing use of Rituxan in the setting of his periorbital and conjunctival symptoms and he notes that he would be okay with taking the risk with the possibility of a rpt reaction given the  uncertainty that it was the rituxan. We discussed the patient's abdomen adequate premedications.    MEDICAL HISTORY:  Past Medical History  Diagnosis Date  . Retinitis pigmentosa     Patient notes that he has been declared legally blind since 1983 and is on Social Security disability  . Abscess of arm, right 10/27/2015  . Lymphoma of lymph nodes of multiple sites ([place]) 10/27/2015  . Shortness of breath 10/27/2015    SURGICAL HISTORY: Past Surgical History  Procedure Laterality Date  . Cataract surgery      Patient notes she has had bilateral Surgery in 1998 and 99  . Tonsillectomy      about 60years old    SOCIAL HISTORY: Social History   Social History  . Marital Status: Widowed    Spouse Name: N/A  . Number of Children: N/A  . Years of Education: N/A   Occupational History  . Not on file.   Social History Main Topics  . Smoking status: Current Every Day Smoker -- 1.00 packs/day for 33 years  . Smokeless tobacco: Never Used  . Alcohol Use: No  . Drug Use: No  . Sexual Activity: Not on file     Comment: Disabled, retinis pigmentosa legally blind, 4children MPOA Lynnn(cousin)   Other Topics Concern  . Not on file   Social History Narrative    FAMILY HISTORY: No family history on file.  ALLERGIES:  has No Known Allergies.  MEDICATIONS:  Current Outpatient Prescriptions  Medication Sig Dispense Refill  . acyclovir (ZOVIRAX) 400 MG tablet Take 1 tablet (400 mg total) by mouth 2 (two) times  daily. 60 tablet 3  . chlorhexidine (PERIDEX) 0.12 % solution Use as directed 15 mLs in the mouth or throat 4 (four) times daily. 240 mL 2  . desonide (DESOWEN) 0.05 % cream APPLY TO THE AFFECTED AREA(S) ON FACE UP TO TWICE DAILY  0  . dexamethasone (DECADRON) 4 MG tablet Take 1 tablet (4 mg total) by mouth 2 (two) times daily with breakfast and lunch. 60 tablet 0  . lidocaine-prilocaine (EMLA) cream Apply 1 application topically as needed. 30 g 6  . ondansetron (ZOFRAN) 8 MG  tablet Take 1 tablet (8 mg total) by mouth 2 (two) times daily as needed for refractory nausea / vomiting. 30 tablet 1  . prochlorperazine (COMPAZINE) 10 MG tablet Take 1 tablet (10 mg total) by mouth every 6 (six) hours as needed (Nausea or vomiting). 30 tablet 6   No current facility-administered medications for this visit.    REVIEW OF SYSTEMS:    10 Point review of Systems was done is negative except as noted above.  PHYSICAL EXAMINATION: ECOG PERFORMANCE STATUS: 2 - Symptomatic, <50% confined to bed  Vital signs reviewed in Epic- stable GENERAL:alert, in no acute distress and comfortable SKIN: some nodular skin lesions over extremities and back, beginning crusted lesions over his right scapular area. EYES: normal, conjunctiva are pink and non-injected, sclera clear. Poor visual acuity - some directional light perception but difficulty with finger counting. OROPHARYNX:no exudate, no erythema and lips, multiple carious teeth with significant periodontal disease. NECK: supple, no JVD, thyroid normal size, non-tender, without nodularity LYMPH:  no palpable lymphadenopathy in the cervical, axillary or inguinal LUNGS: clear to auscultation with normal respiratory effort HEART: regular rate & rhythm,  no murmurs and no lower extremity edema ABDOMEN: abdomen soft, non-tender, normoactive bowel sounds  Musculoskeletal: no cyanosis of digits and no clubbing  PSYCH: alert & oriented x 3 with fluent speech NEURO: no focal motor/sensory deficits  LABORATORY DATA:  I have reviewed the data as listed   CBC Latest Ref Rng 02/20/2016 01/30/2016 01/09/2016  WBC 4.0 - 10.3 10e3/uL 6.4 5.9 9.0  Hemoglobin 13.0 - 17.1 g/dL 12.6(L) 12.5(L) 12.3(L)  Hematocrit 38.4 - 49.9 % 39.7 39.7 39.8  Platelets 140 - 400 10e3/uL 214 195 243    . CMP Latest Ref Rng 02/20/2016 01/30/2016 01/09/2016  Glucose 70 - 140 mg/dl 103 143(H) 118  BUN 7.0 - 26.0 mg/dL 15.1 14.0 11.0  Creatinine 0.7 - 1.3 mg/dL 0.9 0.9 0.9    Sodium 136 - 145 mEq/L 142 141 142  Potassium 3.5 - 5.1 mEq/L 3.9 3.8 3.8  CO2 22 - 29 mEq/L 29 28 28   Calcium 8.4 - 10.4 mg/dL 9.3 9.6 9.4  Total Protein 6.4 - 8.3 g/dL 6.4 6.5 6.6  Total Bilirubin 0.20 - 1.20 mg/dL <0.30 <0.30 <0.30  Alkaline Phos 40 - 150 U/L 55 56 61  AST 5 - 34 U/L 13 15 14   ALT 0 - 55 U/L 14 12 14    Component     Latest Ref Rng 10/25/2015 11/01/2015  HIV     Non Reactive Non Reactive   HCV Ab     0.0 - 0.9 s/co ratio 0.2   Hepatitis B Surface Ag     Negative Negative   Hep B S Ab      Non Reactive   Comment      Comment   LDH     125 - 245 U/L  441 (H)   . Lab Results  Component Value  Date   LDH 135 02/20/2016    Uric acid 7.1   RADIOGRAPHIC STUDIES: I have personally reviewed the radiological images as listed and agreed with the findings in the report. Nm Pet Image Restag (ps) Skull Base To Thigh  03/16/2016  CLINICAL DATA:  Subsequent treatment strategy for mantle cell lymphoma. EXAM: NUCLEAR MEDICINE PET SKULL BASE TO THIGH TECHNIQUE: 9.0 mCi F-18 FDG was injected intravenously. Full-ring PET imaging was performed from the skull base to thigh after the radiotracer. CT data was obtained and used for attenuation correction and anatomic localization. FASTING BLOOD GLUCOSE:  Value: 102 mg/dl COMPARISON:  Multiple exams, including 12/30/2015 FINDINGS: NECK No hypermetabolic lymph nodes in the neck. CHEST No hypermetabolic mediastinal or hilar nodes. No suspicious pulmonary nodules on the CT scan. Background mediastinal SUV 3.2. ABDOMEN/PELVIS Indistinct residual gastrohepatic ligament adenopathy, individual indistinct node 0.8 cm. This conglomerate apparent adenopathy has a maximum SUV of about 3.3, similar to background vascular activity, previously about 18.0. Faintly high left adrenal activity, maximum standard uptake value 4.6, difficult to characterize previously due to the adjacent bulky adenopathy. Prior hypermetabolic retroperitoneal, mesenteric, and  pelvic adenopathy is no longer hypermetabolic. An index left periaortic node measuring 1.1 cm in short axis on image 124 series 4 (previously 2.0 cm) currently has maximum standard uptake value of 2.5, previously 6.3. Background hepatic maximum SUV 4.4. Bilateral groin hernias contain loops of bowel without evidence of strangulation or obstruction. Fluid density lesion in segment 2 of the liver. Gastric activity markedly improved, maximum SUV currently 8.6, previously 20.7. Much if not all of this gastric activity could possibly simply be physiologic at this point. The gastric wall thickening has improved. SKELETON No focal hypermetabolic activity to suggest skeletal metastasis. Arthropathy of both hips, left greater than right. Bridging spurring of both sacroiliac joints. IMPRESSION: 1. The prior hypermetabolic adenopathy has resolved, and most of the previous enlarged lymph nodes are resolved or of normal size, with the exception of a left periaortic lymph node which remains mildly enlarged at 1.1 cm but which is not overtly hypermetabolic. The overall appearance is compatible with dramatic improvement. 2. The gastric wall thickening has improved, and gastric activity is markedly reduced. The remaining gastric activity could possibly simply be physiologic at this point, but may merit surveillance. Electronically Signed   By: Van Clines M.D.   On: 03/16/2016 12:34    ECHO: 11/01/2015: Study Conclusions  - Left ventricle: The cavity size was normal. There was mild concentric hypertrophy. Systolic function was normal. The estimated ejection fraction was in the range of 50% to 55%. Wall motion was normal; there were no regional wall motion abnormalities. Doppler parameters are consistent with abnormal left ventricular relaxation (grade 1 diastolic dysfunction). - Aortic valve: Transvalvular velocity was within the normal range. There was no stenosis. There was no regurgitation. - Mitral  valve: There was no regurgitation. - Right ventricle: The cavity size was normal. Wall thickness was normal. Systolic function was normal. - Tricuspid valve: There was no regurgitation. - Inferior vena cava: The vessel was normal in size. The respirophasic diameter changes were in the normal range (>= 50%), consistent with normal central venous pressure.Study Conclusions  - Left ventricle: The cavity size was normal. There was mild concentric hypertrophy. Systolic function was normal. The estimated ejection fraction was in the range of 50% to 55%. Wall motion was normal; there were no regional wall motion abnormalities. Doppler parameters are consistent with abnormal left ventricular relaxation (grade 1 diastolic dysfunction). - Aortic  valve: Transvalvular velocity was within the normal range. There was no stenosis. There was no regurgitation. - Mitral valve: There was no regurgitation. - Right ventricle: The cavity size was normal. Wall thickness was normal. Systolic function was normal. - Tricuspid valve: There was no regurgitation. - Inferior vena cava: The vessel was normal in size. The respirophasic diameter changes were in the normal range (>= 50%), consistent with normal central venous pressure.    ASSESSMENT & PLAN:    60 yo man with   1) Stage IVB E Mantle Cell lymphoma with likely gastric involvement, extensive LNadenopathy. ECHO nl EF ECOG PS 1-2 but activities significantly limited due to being legally blind HIV/Hep C/Hep B neg Baseline LDH 450--->135   PET/CT scan after 3 cycles of R CHOP show good overall response. Stomach lesion still with very FDG uptake ? Residual lymphoma versus inflammation.   Patient is status post 6 cycles of R CHOP. PET/CT after 6 cycles shows resolution of hypermetabolic lymphadenopathy and gastric wall activity . Patient appears to be in CR at this time. LDH levels are normalized . Patient has no clinical  symptoms of disease progression and feels very well .  Kristopher Kitchen2) Retinitis pigmentosa causing legal blindness - has been set up and is seeing a retina specialist in town. No acute interventions at this time.  3) Weight loss and protein calorie malnutrition. Resolving. Weight has improved significantly since his diagnosis. He is eating well. . Wt Readings from Last 3 Encounters:  03/19/16 183 lb 9.6 oz (83.28 kg)  01/30/16 183 lb 14.4 oz (83.416 kg)  01/09/16 183 lb 4.8 oz (83.144 kg)    4) multiple scab like/nodular skin lesions ? Due to pruritus and scratching with likely skin involvement by lymphoma. These have resolved .  5) Peri-orbital dermatitis /Conjuncitivitis - patient was seen by the dermatologist and given topical desonide ointment which led to the resolution of his dermatitis. There were not sure about the etiology but noted then to could not rule out Rituxan is a possibility. Currently his dermatitis has resolved and his conjunctivitis has resolved. He did not have rash anywhere else at the time. No oral sores or other toxicities. This happened about 4 days after Rituxan another chemotherapy. PLAN  -Patient currently appears to be in complete remission from his mantle cell lymphoma status post 6 cycles of R CHOP chemotherapy. -After discussing the pros and cons including his potential delay his peri-orbital skin reaction the patient provides informed consent to proceed with maintenance Rituxan therapy. -We will do standard protocol Rituxan and not the rapid protocol given uncertainty about reaction. We shall also premedicate him with dexamethasone in addition to H1 and H2 blockade. -I will probably have him take oral prednisone for about 5-7 days post dose. -The patient has any repeat reaction to the Rituxan would need to discontinue this. -Patient needs a primary care physician for other ongoing follow-ups. -Continue follow-up with dermatology as per the recommendations   Return to  care with Dr. Irene Limbo in 8 weeks with labs and appointment for maintenance Rituxan after clinic visit.  I spent 25 minutes counseling the patient face to face. The total time spent in the appointment was 25 minutes and more than 50% was on counseling and direct patient cares. Patient was accompanied by his son and daughter for this visit .    Sullivan Lone MD [place] AAHIVMS [facility] Hematology/Oncology Physician [place]  (Office):       [phone] (Work cell):  [phone] (Fax):           [phone]

## 2016-03-20 ENCOUNTER — Encounter: Payer: Self-pay | Admitting: Hematology

## 2016-03-20 NOTE — Progress Notes (Signed)
Pt bought in letter from [facility] stating that at this time, the $5,000 award amount has been reached & his file has been closed.  He cannot reapply until 11/08/16.  I sent a copy of letter to [facility] in billing.

## 2016-03-22 ENCOUNTER — Telehealth: Payer: Self-pay | Admitting: *Deleted

## 2016-03-22 NOTE — Telephone Encounter (Signed)
Per staff message and POF I have scheduled appts. Advised scheduler of appts. JMW  

## 2016-03-23 ENCOUNTER — Telehealth: Payer: Self-pay | Admitting: Hematology

## 2016-03-23 NOTE — Telephone Encounter (Signed)
cld pt and left message of appt time & date for 8/14@9 :30

## 2016-03-28 ENCOUNTER — Encounter: Payer: Self-pay | Admitting: *Deleted

## 2016-03-29 ENCOUNTER — Encounter: Payer: Self-pay | Admitting: *Deleted

## 2016-03-29 NOTE — Progress Notes (Signed)
Son called and left a message about whether or not patient could eat fish.  Called x 3 to try and reach.  Did call patient's phone and left a message for them to call office back with questions.

## 2016-04-02 ENCOUNTER — Ambulatory Visit: Payer: Medicare Other

## 2016-04-02 ENCOUNTER — Other Ambulatory Visit: Payer: Medicare Other

## 2016-04-03 ENCOUNTER — Encounter: Payer: Self-pay | Admitting: *Deleted

## 2016-05-02 ENCOUNTER — Telehealth: Payer: Self-pay | Admitting: Hematology

## 2016-05-02 NOTE — Telephone Encounter (Signed)
Appointment confirmed with patient. Kristopher Johnson °

## 2016-05-21 ENCOUNTER — Ambulatory Visit: Payer: Medicare Other | Admitting: Hematology

## 2016-05-21 ENCOUNTER — Ambulatory Visit: Payer: Medicare Other

## 2016-05-21 ENCOUNTER — Other Ambulatory Visit: Payer: Medicare Other

## 2016-05-30 ENCOUNTER — Ambulatory Visit: Payer: Medicare Other

## 2016-05-30 ENCOUNTER — Encounter: Payer: Self-pay | Admitting: Nurse Practitioner

## 2016-05-30 ENCOUNTER — Other Ambulatory Visit (HOSPITAL_BASED_OUTPATIENT_CLINIC_OR_DEPARTMENT_OTHER): Payer: Medicare Other

## 2016-05-30 ENCOUNTER — Ambulatory Visit (HOSPITAL_BASED_OUTPATIENT_CLINIC_OR_DEPARTMENT_OTHER): Payer: Medicare Other | Admitting: Nurse Practitioner

## 2016-05-30 ENCOUNTER — Other Ambulatory Visit: Payer: Self-pay | Admitting: Hematology

## 2016-05-30 VITALS — BP 113/66 | HR 64 | Temp 97.9°F | Resp 16 | Ht 69.0 in | Wt 193.3 lb

## 2016-05-30 DIAGNOSIS — C8318 Mantle cell lymphoma, lymph nodes of multiple sites: Secondary | ICD-10-CM

## 2016-05-30 DIAGNOSIS — C8598 Non-Hodgkin lymphoma, unspecified, lymph nodes of multiple sites: Secondary | ICD-10-CM

## 2016-05-30 DIAGNOSIS — Z95828 Presence of other vascular implants and grafts: Secondary | ICD-10-CM

## 2016-05-30 LAB — COMPREHENSIVE METABOLIC PANEL
ALT: 16 U/L (ref 0–55)
ANION GAP: 7 meq/L (ref 3–11)
AST: 14 U/L (ref 5–34)
Albumin: 3.6 g/dL (ref 3.5–5.0)
Alkaline Phosphatase: 76 U/L (ref 40–150)
BILIRUBIN TOTAL: 0.4 mg/dL (ref 0.20–1.20)
BUN: 13.6 mg/dL (ref 7.0–26.0)
CHLORIDE: 105 meq/L (ref 98–109)
CO2: 29 meq/L (ref 22–29)
CREATININE: 0.9 mg/dL (ref 0.7–1.3)
Calcium: 9.4 mg/dL (ref 8.4–10.4)
EGFR: >90 mL/min/{1.73_m2} (ref >90)
Glucose: 126 mg/dl (ref 70–140)
Potassium: 3.9 mEq/L (ref 3.5–5.1)
Sodium: 141 mEq/L (ref 136–145)
TOTAL PROTEIN: 6.7 g/dL (ref 6.4–8.3)

## 2016-05-30 LAB — CBC & DIFF AND RETIC
BASO%: 0.7 % (ref 0.0–2.0)
BASOS ABS: 0.1 10*3/uL (ref 0.0–0.1)
EOS%: 5.8 % (ref 0.0–7.0)
Eosinophils Absolute: 0.4 10*3/uL (ref 0.0–0.5)
HEMATOCRIT: 44.2 % (ref 38.4–49.9)
HGB: 14.1 g/dL (ref 13.0–17.1)
IMMATURE RETIC FRACT: 9.2 % (ref 3.00–10.60)
LYMPH#: 0.7 10*3/uL — AB (ref 0.9–3.3)
LYMPH%: 10.3 % — ABNORMAL LOW (ref 14.0–49.0)
MCH: 29.4 pg (ref 27.2–33.4)
MCHC: 31.9 g/dL — AB (ref 32.0–36.0)
MCV: 92.1 fL (ref 79.3–98.0)
MONO#: 0.9 10*3/uL (ref 0.1–0.9)
MONO%: 12.8 % (ref 0.0–14.0)
NEUT#: 4.8 10*3/uL (ref 1.5–6.5)
NEUT%: 70.4 % (ref 39.0–75.0)
PLATELETS: 156 10*3/uL (ref 140–400)
RBC: 4.8 10*6/uL (ref 4.20–5.82)
RDW: 14.8 % — AB (ref 11.0–14.6)
RETIC %: 1.16 % (ref 0.80–1.80)
RETIC CT ABS: 55.68 10*3/uL (ref 34.80–93.90)
WBC: 6.8 10*3/uL (ref 4.0–10.3)

## 2016-05-30 LAB — LACTATE DEHYDROGENASE: LDH: 163 U/L (ref 125–245)

## 2016-05-30 MED ORDER — SODIUM CHLORIDE 0.9% FLUSH
10.0000 mL | Freq: Once | INTRAVENOUS | Status: AC
  Administered 2016-05-30: 10 mL via INTRAVENOUS
  Filled 2016-05-30: qty 10

## 2016-05-30 MED ORDER — DEXAMETHASONE 4 MG PO TABS: 4.0000 mg | ORAL_TABLET | Freq: Two times a day (BID) | ORAL | 0 refills | Status: AC

## 2016-05-30 MED ORDER — DIPHENHYDRAMINE HCL 25 MG PO CAPS
ORAL_CAPSULE | ORAL | Status: AC
  Filled 2016-05-30: qty 2

## 2016-05-30 MED ORDER — SODIUM CHLORIDE 0.9 % IJ SOLN
10.0000 mL | INTRAMUSCULAR | Status: DC | PRN
  Administered 2016-05-30: 10 mL via INTRAVENOUS
  Filled 2016-05-30: qty 10

## 2016-05-30 MED ORDER — HEPARIN SOD (PORK) LOCK FLUSH 100 UNIT/ML IV SOLN
500.0000 [IU] | Freq: Once | INTRAVENOUS | Status: AC
  Administered 2016-05-30: 500 [IU] via INTRAVENOUS
  Filled 2016-05-30: qty 5

## 2016-05-30 MED ORDER — ACETAMINOPHEN 325 MG PO TABS
ORAL_TABLET | ORAL | Status: AC
  Filled 2016-05-30: qty 2

## 2016-05-30 MED ORDER — FAMOTIDINE IN NACL 20-0.9 MG/50ML-% IV SOLN
INTRAVENOUS | Status: AC
  Filled 2016-05-30: qty 50

## 2016-05-30 NOTE — Progress Notes (Signed)
Established patient visit  HPI:  Kristopher Johnson 60 y.o. male diagnosed with mantle cell lymphoma.  Currently undergoing maintenance Rituxan infusions.  Patient presented to the [facility] today to receive his next maintenance Rituxan infusion.  He has no new complaints whatsoever.    No history exists.    Review of Systems  All other systems reviewed and are negative.   Past Medical History:  Diagnosis Date  . Abscess of arm, right 10/27/2015  . Lymphoma of lymph nodes of multiple sites ([place]) 10/27/2015  . 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 Shortness of breath 10/27/2015    Past Surgical History:  Procedure Laterality Date  . Cataract surgery     Patient notes she has had bilateral Surgery in 1998 and 99  . TONSILLECTOMY     about 60years old    has Lymphadenopathy; Hypoalbuminemia; Elevated lipase; Inguinal hernia; Urinary retention; Abdominal pain; Abscess of arm, right; Lymphoma of lymph nodes of multiple sites ([place]); Shortness of breath; Shortness of breath dyspnea; Mantle cell lymphoma of lymph nodes of multiple regions ([place]); Protein-calorie malnutrition ([place]); Legally blind; Rash; Skin lesion of chest wall; Shingles; and Portacath in place on his problem list.    has No Known Allergies.    Medication List       Accurate as of 05/30/16 12:43 PM. Always use your most recent med list.          acyclovir 400 MG tablet Commonly known as:  ZOVIRAX Take 1 tablet (400 mg total) by mouth 2 (two) times daily.   chlorhexidine 0.12 % solution Commonly known as:  PERIDEX Use as directed 15 mLs in the mouth or throat 4 (four) times daily.   desonide 0.05 % cream Commonly known as:  DESOWEN APPLY TO THE AFFECTED AREA(S) ON FACE UP TO TWICE DAILY   dexamethasone 4 MG tablet Commonly known as:  DECADRON Take 1 tablet (4 mg total) by mouth 2 (two) times daily with breakfast  and lunch. For 7 days after every dose of Rituxan   lidocaine-prilocaine cream Commonly known as:  EMLA Apply 1 application topically as needed.   ondansetron 8 MG tablet Commonly known as:  ZOFRAN Take 1 tablet (8 mg total) by mouth 2 (two) times daily as needed for refractory nausea / vomiting.   prochlorperazine 10 MG tablet Commonly known as:  COMPAZINE Take 1 tablet (10 mg total) by mouth every 6 (six) hours as needed (Nausea or vomiting).        PHYSICAL EXAMINATION  Oncology Vitals 05/30/2016 03/19/2016  Height 175 cm 175 cm  Weight 87.68 kg 83.28 kg  Weight (lbs) 193 lbs 5 oz 183 lbs 10 oz  BMI (kg/m2) 28.55 kg/m2 27.11 kg/m2  Temp 97.9 97.7  Pulse 64 72  Resp 16 18  SpO2 100 98  BSA (m2) 2.07 m2 2.01 m2   BP Readings from Last 2 Encounters:  05/30/16 113/66  03/19/16 114/65    Physical Exam  Constitutional: He is oriented to person, place, and time and well-developed, well-nourished, and in no distress.  HENT:  Head: Normocephalic and atraumatic.  Eyes:  Patient is considered legally blind.  Neck: Normal range of motion.  Pulmonary/Chest: Effort normal. No respiratory distress.  Musculoskeletal: Normal range of motion.  Neurological: He is alert and oriented to person, place, and time. Gait normal.  Psychiatric: Affect normal.  Nursing note and  vitals reviewed.   LABORATORY DATA:. Appointment on 05/30/2016  Component Date Value Ref Range Status  . WBC 05/30/2016 6.8  4.0 - 10.3 10e3/uL Final  . NEUT# 05/30/2016 4.8  1.5 - 6.5 10e3/uL Final  . HGB 05/30/2016 14.1  13.0 - 17.1 g/dL Final  . HCT 05/30/2016 44.2  38.4 - 49.9 % Final  . Platelets 05/30/2016 156  140 - 400 10e3/uL Final  . MCV 05/30/2016 92.1  79.3 - 98.0 fL Final  . MCH 05/30/2016 29.4  27.2 - 33.4 pg Final  . MCHC 05/30/2016 31.9* 32.0 - 36.0 g/dL Final  . RBC 05/30/2016 4.80  4.20 - 5.82 10e6/uL Final  . RDW 05/30/2016 14.8* 11.0 - 14.6 % Final  . lymph# 05/30/2016 0.7* 0.9 - 3.3  10e3/uL Final  . MONO# 05/30/2016 0.9  0.1 - 0.9 10e3/uL Final  . Eosinophils Absolute 05/30/2016 0.4  0.0 - 0.5 10e3/uL Final  . Basophils Absolute 05/30/2016 0.1  0.0 - 0.1 10e3/uL Final  . NEUT% 05/30/2016 70.4  39.0 - 75.0 % Final  . LYMPH% 05/30/2016 10.3* 14.0 - 49.0 % Final  . MONO% 05/30/2016 12.8  0.0 - 14.0 % Final  . EOS% 05/30/2016 5.8  0.0 - 7.0 % Final  . BASO% 05/30/2016 0.7  0.0 - 2.0 % Final  . Retic % 05/30/2016 1.16  0.80 - 1.80 % Final  . Retic Ct Abs 05/30/2016 55.68  34.80 - 93.90 10e3/uL Final  . Immature Retic Fract 05/30/2016 9.20  3.00 - 10.60 % Final  . Sodium 05/30/2016 141  136 - 145 mEq/L Final  . Potassium 05/30/2016 3.9  3.5 - 5.1 mEq/L Final  . Chloride 05/30/2016 105  98 - 109 mEq/L Final  . CO2 05/30/2016 29  22 - 29 mEq/L Final  . Glucose 05/30/2016 126  70 - 140 mg/dl Final  . BUN 05/30/2016 13.6  7.0 - 26.0 mg/dL Final  . Creatinine 05/30/2016 0.9  0.7 - 1.3 mg/dL Final  . Total Bilirubin 05/30/2016 0.40  0.20 - 1.20 mg/dL Final  . Alkaline Phosphatase 05/30/2016 76  40 - 150 U/L Final  . AST 05/30/2016 14  5 - 34 U/L Final  . ALT 05/30/2016 16  0 - 55 U/L Final  . Total Protein 05/30/2016 6.7  6.4 - 8.3 g/dL Final  . Albumin 05/30/2016 3.6  3.5 - 5.0 g/dL Final  . Calcium 05/30/2016 9.4  8.4 - 10.4 mg/dL Final  . Anion Gap 05/30/2016 7  3 - 11 mEq/L Final  . EGFR 05/30/2016 >90  >90 ml/min/1.73 m2 Final  . LDH 05/30/2016 163  125 - 245 U/L Final    RADIOGRAPHIC STUDIES: No results found.  ASSESSMENT/PLAN:    Mantle cell lymphoma of lymph nodes of multiple regions [facility]) Patient received his last chemotherapy on 02/20/2016.  He presented to the [facility] today to receive his maintenance Rituxan infusion.  Patient states he's been doing fairly well.  He has no new complaints.  He states that all of his rash to his periorbital region and his eyes have completely resolved after he visited the dermatologist and use some steroid cream to the  site.  Vital signs obtained today were all within normal limits.  Labs obtained today were essentially normal; with the LDH slightly increased from 135-163.  Both patient and his family nurse  had multiple questions regarding patient's planned maintenance Rituxan infusion today.  Patient stated that he was completely unaware that he was scheduled for an infusion today;  since he has been told that he is in remission.  Also, patient's family member stated that he needed no further infusions-since "his DNA is strong: and can fight to keep patient in remission".  This provider called Dr. Irene Limbo; and advised him on both patient and his family's concerns.  This provider then relayed conversation to the patient and his family.  Advised patient that the Rituxan maintenance infusion was to hopefully keep the patient in remission longer.  Advised patient that he may not stay in remission as long if he does not receive the Rituxan maintenance infusions.  However, both patient and his family prefer to hold on receiving the Rituxan infusion today.  They will be rescheduled to meet with Dr. Irene Limbo in one to 2 weeks to further discuss treatment options and future plan of care.      Patient stated understanding of all instructions; and was in agreement with this plan of care. The patient knows to call the clinic with any problems, questions or concerns.   Total time spent with patient was 25 minutes;  with greater than 75 percent of that time spent in face to face counseling regarding patient's symptoms,  and coordination of care and follow up.  Disclaimer:This dictation was prepared with Dragon/digital dictation along with [employer]. Any transcriptional errors that result from this process are unintentional.  Drue Second, NP 05/30/2016

## 2016-05-30 NOTE — Assessment & Plan Note (Signed)
Patient received his last chemotherapy on 02/20/2016.  He presented to the [facility] today to receive his maintenance Rituxan infusion.  Patient states he's been doing fairly well.  He has no new complaints.  He states that all of his rash to his periorbital region and his eyes have completely resolved after he visited the dermatologist and use some steroid cream to the site.  Vital signs obtained today were all within normal limits.  Labs obtained today were essentially normal; with the LDH slightly increased from 135-163.  Both patient and his family nurse  had multiple questions regarding patient's planned maintenance Rituxan infusion today.  Patient stated that he was completely unaware that he was scheduled for an infusion today; since he has been told that he is in remission.  Also, patient's family member stated that he needed no further infusions-since "his DNA is strong: and can fight to keep patient in remission".  This provider called Dr. Irene Limbo; and advised him on both patient and his family's concerns.  This provider then relayed conversation to the patient and his family.  Advised patient that the Rituxan maintenance infusion was to hopefully keep the patient in remission longer.  Advised patient that he may not stay in remission as long if he does not receive the Rituxan maintenance infusions.  However, both patient and his family prefer to hold on receiving the Rituxan infusion today.  They will be rescheduled to meet with Dr. Irene Limbo in one to 2 weeks to further discuss treatment options and future plan of care.

## 2016-06-01 ENCOUNTER — Telehealth: Payer: Self-pay | Admitting: Hematology

## 2016-06-01 NOTE — Telephone Encounter (Signed)
lvm to inform pt of 8/29 appt per LOS

## 2016-06-04 ENCOUNTER — Other Ambulatory Visit: Payer: Self-pay | Admitting: *Deleted

## 2016-06-04 DIAGNOSIS — C8598 Non-Hodgkin lymphoma, unspecified, lymph nodes of multiple sites: Secondary | ICD-10-CM

## 2016-06-04 DIAGNOSIS — C8318 Mantle cell lymphoma, lymph nodes of multiple sites: Secondary | ICD-10-CM

## 2016-06-05 ENCOUNTER — Telehealth: Payer: Self-pay | Admitting: *Deleted

## 2016-06-05 ENCOUNTER — Ambulatory Visit: Payer: Medicare Other

## 2016-06-05 ENCOUNTER — Ambulatory Visit: Payer: Medicare Other | Admitting: Hematology

## 2016-06-05 ENCOUNTER — Other Ambulatory Visit: Payer: Medicare Other

## 2016-06-05 NOTE — Telephone Encounter (Signed)
Pt missed lab/MD visit today.  LVM with patient and also with daughter Altha Harm to reschedule apt.

## 2016-06-06 ENCOUNTER — Telehealth (HOSPITAL_COMMUNITY): Payer: Self-pay | Admitting: *Deleted

## 2016-06-06 NOTE — Telephone Encounter (Signed)
No call back from patient regarding message left with him yesterday.  Called pt and LVM again today to follow up/inform him that he needs to reschedule apt with Dr. Irene Limbo to discuss maintenance treatment.

## 2016-06-07 ENCOUNTER — Telehealth: Payer: Self-pay | Admitting: *Deleted

## 2016-06-07 ENCOUNTER — Encounter (HOSPITAL_COMMUNITY): Payer: Self-pay | Admitting: *Deleted

## 2016-06-07 ENCOUNTER — Encounter: Payer: Self-pay | Admitting: *Deleted

## 2016-06-07 NOTE — Telephone Encounter (Signed)
Per Dr. Irene Limbo letter mailed to patient to reschedule apt for discussion of maintenance rituxan therapy.  See letters for details.

## 2016-06-10 ENCOUNTER — Telehealth: Payer: Self-pay | Admitting: Hematology

## 2016-06-10 NOTE — Telephone Encounter (Signed)
[facility] COVERING AP- MOVED 9/6 APPOINTMENTS TO 9/11. LEFT MESSAGE FOR PATIENT AND MAILED SCHEDULE.

## 2016-06-13 ENCOUNTER — Ambulatory Visit: Payer: Medicare Other | Admitting: Hematology

## 2016-06-18 ENCOUNTER — Encounter: Payer: Self-pay | Admitting: Hematology

## 2016-06-18 ENCOUNTER — Ambulatory Visit (HOSPITAL_BASED_OUTPATIENT_CLINIC_OR_DEPARTMENT_OTHER): Payer: Medicare Other | Admitting: Hematology

## 2016-06-18 VITALS — BP 130/77 | HR 66 | Temp 98.0°F | Resp 18 | Wt 199.4 lb

## 2016-06-18 DIAGNOSIS — C8318 Mantle cell lymphoma, lymph nodes of multiple sites: Secondary | ICD-10-CM

## 2016-06-18 DIAGNOSIS — Z720 Tobacco use: Secondary | ICD-10-CM | POA: Diagnosis not present

## 2016-06-18 DIAGNOSIS — H3552 Pigmentary retinal dystrophy: Secondary | ICD-10-CM | POA: Diagnosis not present

## 2016-06-18 MED ORDER — DEXAMETHASONE 4 MG PO TABS: 4.0000 mg | ORAL_TABLET | Freq: Two times a day (BID) | ORAL | 3 refills | Status: DC

## 2016-06-18 NOTE — Patient Instructions (Signed)
Dexamethasone 4mg  po twice daily for 5 days after Rituxan infusion (Prescription sent to [facility])

## 2016-06-21 MED ORDER — ONDANSETRON HCL 4 MG/2ML IJ SOLN
INTRAMUSCULAR | Status: AC
  Filled 2016-06-21: qty 2

## 2016-06-21 MED ORDER — PROPOFOL 10 MG/ML IV BOLUS
INTRAVENOUS | Status: AC
  Filled 2016-06-21: qty 20

## 2016-06-21 MED ORDER — ALBUTEROL SULFATE HFA 108 (90 BASE) MCG/ACT IN AERS
INHALATION_SPRAY | RESPIRATORY_TRACT | Status: AC
Start: 2016-06-21 — End: 2016-06-21
  Filled 2016-06-21: qty 6.7

## 2016-06-21 MED ORDER — FENTANYL CITRATE (PF) 100 MCG/2ML IJ SOLN
INTRAMUSCULAR | Status: AC
  Filled 2016-06-21: qty 2

## 2016-06-21 MED ORDER — LIDOCAINE 2% (20 MG/ML) 5 ML SYRINGE
INTRAMUSCULAR | Status: AC
  Filled 2016-06-21: qty 5

## 2016-06-21 MED ORDER — MIDAZOLAM HCL 2 MG/2ML IJ SOLN
INTRAMUSCULAR | Status: AC
  Filled 2016-06-21: qty 2

## 2016-06-23 ENCOUNTER — Telehealth: Payer: Self-pay | Admitting: Hematology

## 2016-06-23 NOTE — Telephone Encounter (Signed)
Left message for patient re next appointment for 9/19. Patient to get new schedule 9/19.

## 2016-06-26 ENCOUNTER — Ambulatory Visit (HOSPITAL_BASED_OUTPATIENT_CLINIC_OR_DEPARTMENT_OTHER): Payer: Medicare Other

## 2016-06-26 ENCOUNTER — Telehealth: Payer: Self-pay

## 2016-06-26 VITALS — BP 120/63 | HR 68 | Temp 97.7°F | Resp 16

## 2016-06-26 DIAGNOSIS — Z5112 Encounter for antineoplastic immunotherapy: Secondary | ICD-10-CM

## 2016-06-26 DIAGNOSIS — Z5111 Encounter for antineoplastic chemotherapy: Secondary | ICD-10-CM | POA: Diagnosis not present

## 2016-06-26 DIAGNOSIS — C8318 Mantle cell lymphoma, lymph nodes of multiple sites: Secondary | ICD-10-CM

## 2016-06-26 MED ORDER — ACETAMINOPHEN 325 MG PO TABS
ORAL_TABLET | ORAL | Status: AC
  Filled 2016-06-26: qty 2

## 2016-06-26 MED ORDER — SODIUM CHLORIDE 0.9 % IV SOLN
Freq: Once | INTRAVENOUS | Status: AC
  Administered 2016-06-26: 09:00:00 via INTRAVENOUS

## 2016-06-26 MED ORDER — DIPHENHYDRAMINE HCL 25 MG PO CAPS
ORAL_CAPSULE | ORAL | Status: AC
  Filled 2016-06-26: qty 2

## 2016-06-26 MED ORDER — FAMOTIDINE IN NACL 20-0.9 MG/50ML-% IV SOLN
INTRAVENOUS | Status: AC
  Filled 2016-06-26: qty 50

## 2016-06-26 MED ORDER — SODIUM CHLORIDE 0.9 % IV SOLN
10.0000 mg | Freq: Once | INTRAVENOUS | Status: AC
  Administered 2016-06-26: 10 mg via INTRAVENOUS
  Filled 2016-06-26: qty 1

## 2016-06-26 MED ORDER — SODIUM CHLORIDE 0.9 % IV SOLN
375.0000 mg/m2 | Freq: Once | INTRAVENOUS | Status: AC
  Administered 2016-06-26: 800 mg via INTRAVENOUS
  Filled 2016-06-26: qty 50

## 2016-06-26 MED ORDER — ACETAMINOPHEN 325 MG PO TABS
650.0000 mg | ORAL_TABLET | Freq: Once | ORAL | Status: AC
Start: 2016-06-26 — End: 2016-06-26
  Administered 2016-06-26: 650 mg via ORAL

## 2016-06-26 MED ORDER — DIPHENHYDRAMINE HCL 25 MG PO CAPS
50.0000 mg | ORAL_CAPSULE | Freq: Once | ORAL | Status: AC
  Administered 2016-06-26: 50 mg via ORAL

## 2016-06-26 MED ORDER — FAMOTIDINE IN NACL 20-0.9 MG/50ML-% IV SOLN
20.0000 mg | Freq: Once | INTRAVENOUS | Status: AC
  Administered 2016-06-26: 20 mg via INTRAVENOUS

## 2016-06-26 MED ORDER — SODIUM CHLORIDE 0.9% FLUSH
10.0000 mL | INTRAVENOUS | Status: DC | PRN
  Administered 2016-06-26: 10 mL
  Filled 2016-06-26: qty 10

## 2016-06-26 MED ORDER — HEPARIN SOD (PORK) LOCK FLUSH 100 UNIT/ML IV SOLN
500.0000 [IU] | Freq: Once | INTRAVENOUS | Status: AC | PRN
  Administered 2016-06-26: 500 [IU]
  Filled 2016-06-26: qty 5

## 2016-06-26 NOTE — Progress Notes (Signed)
Kristopher Johnson    HEMATOLOGY/ONCOLOGY CLINIC NOTE  Date of Service: .06/18/2016   Patient Care Team: No Pcp Per Patient as PCP - General (General Practice)  CHIEF COMPLAINTS/PURPOSE OF CONSULTATION:   followup for New Diagnosed Mantle Cell lymphoma  DIAGNOSIS Stage IV Mantle cell lymphoma diagnosed in 10/25/2015  Current TREATMENT   planned Rituxan maintenance  PREVIOUS TREATMENT  Status post R CHOP 6 cycles . Patient chooses not to pursue AutoHSCT based strategies which is quite reasonable considering his functional challenges with legal blindness and limited social support.   HISTORY OF PRESENTING ILLNESS: plz see clinic note for details on initial presentation  Interval History  Kristopher Johnson is here for follow-up of his mantle cell lymphoma after completing 6 cycles of R CHOP chemotherapy. He was seen by our nurse practitioner Retta Mac on follow-up prior to starting maintenance Rituxan but wanted to discuss this further prior to agreeing even though he had been counseled about this previously. He is here for follow-up. Notes no new enlarged lymph nodes. No night sweats sedation is reduced at seeing the patient is noted quick needed for something that   MEDICAL HISTORY:  Past Medical History:  Diagnosis Date  . Abscess of arm, right 10/27/2015  . Lymphoma of lymph nodes of multiple sites ([place]) 10/27/2015  . 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 Shortness of breath 10/27/2015    SURGICAL HISTORY: Past Surgical History:  Procedure Laterality Date  . Cataract surgery     Patient notes she has had bilateral Surgery in 1998 and 99  . TONSILLECTOMY     about 60years old    SOCIAL HISTORY: Social History   Social History  . Marital status: Widowed    Spouse name: N/A  . Number of children: N/A  . Years of education: N/A   Occupational History  . Not on file.   Social History Main  Topics  . Smoking status: Current Every Day Smoker    Packs/day: 1.00    Years: 33.00  . Smokeless tobacco: Never Used  . Alcohol use No  . Drug use: No  . Sexual activity: Not on file     Comment: Disabled, retinis pigmentosa legally blind, 4children MPOA Lynnn(cousin)   Other Topics Concern  . Not on file   Social History Narrative  . No narrative on file    FAMILY HISTORY: History reviewed. No pertinent family history.  ALLERGIES:  has No Known Allergies.  MEDICATIONS:  Current Outpatient Prescriptions  Medication Sig Dispense Refill  . chlorhexidine (PERIDEX) 0.12 % solution Use as directed 15 mLs in the mouth or throat 4 (four) times daily. (Patient not taking: Reported on 05/30/2016) 240 mL 2  . desonide (DESOWEN) 0.05 % cream APPLY TO THE AFFECTED AREA(S) ON FACE UP TO TWICE DAILY  0  . dexamethasone (DECADRON) 4 MG tablet Take 1 tablet (4 mg total) by mouth 2 (two) times daily. For 5 days after Rituxan dose 10 tablet 3  . lidocaine-prilocaine (EMLA) cream Apply 1 application topically as needed. (Patient not taking: Reported on 06/18/2016) 30 g 6  . ondansetron (ZOFRAN) 8 MG tablet Take 1 tablet (8 mg total) by mouth 2 (two) times daily as needed for refractory nausea / vomiting. (Patient not taking: Reported on 05/30/2016) 30 tablet 1  . prochlorperazine (COMPAZINE) 10 MG tablet Take 1 tablet (10 mg total) by mouth every 6 (six) hours as  needed (Nausea or vomiting). (Patient not taking: Reported on 05/30/2016) 30 tablet 6   No current facility-administered medications for this visit.     REVIEW OF SYSTEMS:    10 Point review of Systems was done is negative except as noted above.  PHYSICAL EXAMINATION: ECOG PERFORMANCE STATUS: 2 - Symptomatic, <50% confined to bed  Vital signs reviewed in Epic- stable GENERAL:alert, in no acute distress and comfortable SKIN: some nodular skin lesions over extremities and back, beginning crusted lesions over his right scapular  area. EYES: normal, conjunctiva are pink and non-injected, sclera clear. Poor visual acuity - some directional light perception but difficulty with finger counting. OROPHARYNX:no exudate, no erythema and lips, multiple carious teeth with significant periodontal disease. NECK: supple, no JVD, thyroid normal size, non-tender, without nodularity LYMPH:  no palpable lymphadenopathy in the cervical, axillary or inguinal LUNGS: clear to auscultation with normal respiratory effort HEART: regular rate & rhythm,  no murmurs and no lower extremity edema ABDOMEN: abdomen soft, non-tender, normoactive bowel sounds  Musculoskeletal: no cyanosis of digits and no clubbing  PSYCH: alert & oriented x 3 with fluent speech NEURO: no focal motor/sensory deficits  LABORATORY DATA:  I have reviewed the data as listed   CBC Latest Ref Rng & Units 05/30/2016 02/20/2016 01/30/2016  WBC 4.0 - 10.3 10e3/uL 6.8 6.4 5.9  Hemoglobin 13.0 - 17.1 g/dL 14.1 12.6(L) 12.5(L)  Hematocrit 38.4 - 49.9 % 44.2 39.7 39.7  Platelets 140 - 400 10e3/uL 156 214 195    . CMP Latest Ref Rng & Units 05/30/2016 02/20/2016 01/30/2016  Glucose 70 - 140 mg/dl 126 103 143(H)  BUN 7.0 - 26.0 mg/dL 13.6 15.1 14.0  Creatinine 0.7 - 1.3 mg/dL 0.9 0.9 0.9  Sodium 136 - 145 mEq/L 141 142 141  Potassium 3.5 - 5.1 mEq/L 3.9 3.9 3.8  Chloride 101 - 111 mmol/L - - -  CO2 22 - 29 mEq/L 29 29 28   Calcium 8.4 - 10.4 mg/dL 9.4 9.3 9.6  Total Protein 6.4 - 8.3 g/dL 6.7 6.4 6.5  Total Bilirubin 0.20 - 1.20 mg/dL 0.40 <0.30 <0.30  Alkaline Phos 40 - 150 U/L 76 55 56  AST 5 - 34 U/L 14 13 15   ALT 0 - 55 U/L 16 14 12     Lab Results  Component Value Date   LDH 163 05/30/2016    Uric acid 7.1   RADIOGRAPHIC STUDIES: I have personally reviewed the radiological images as listed and agreed with the findings in the report. No results found.  ECHO: 11/01/2015: Study Conclusions  - Left ventricle: The cavity size was normal. There was  mild concentric hypertrophy. Systolic function was normal. The estimated ejection fraction was in the range of 50% to 55%. Wall motion was normal; there were no regional wall motion abnormalities. Doppler parameters are consistent with abnormal left ventricular relaxation (grade 1 diastolic dysfunction). - Aortic valve: Transvalvular velocity was within the normal range. There was no stenosis. There was no regurgitation. - Mitral valve: There was no regurgitation. - Right ventricle: The cavity size was normal. Wall thickness was normal. Systolic function was normal. - Tricuspid valve: There was no regurgitation. - Inferior vena cava: The vessel was normal in size. The respirophasic diameter changes were in the normal range (>= 50%), consistent with normal central venous pressure.Study Conclusions  - Left ventricle: The cavity size was normal. There was mild concentric hypertrophy. Systolic function was normal. The estimated ejection fraction was in the range of 50% to 55%. Wall  motion was normal; there were no regional wall motion abnormalities. Doppler parameters are consistent with abnormal left ventricular relaxation (grade 1 diastolic dysfunction). - Aortic valve: Transvalvular velocity was within the normal range. There was no stenosis. There was no regurgitation. - Mitral valve: There was no regurgitation. - Right ventricle: The cavity size was normal. Wall thickness was normal. Systolic function was normal. - Tricuspid valve: There was no regurgitation. - Inferior vena cava: The vessel was normal in size. The respirophasic diameter changes were in the normal range (>= 50%), consistent with normal central venous pressure.    ASSESSMENT & PLAN:    60 yo man with   1) Stage IVB E Mantle Cell lymphoma with likely gastric involvement, extensive LNadenopathy. ECHO nl EF ECOG PS 1-2 but activities significantly limited due to being legally  blind HIV/Hep C/Hep B neg Baseline LDH 450--->135-->163  PET/CT scan after 3 cycles of R CHOP show good overall response. Stomach lesion still with very FDG uptake ? Residual lymphoma versus inflammation.   Patient is status post 6 cycles of R CHOP. PET/CT after 6 cycles shows resolution of hypermetabolic lymphadenopathy and gastric wall activity . Patient appears to be in CR at this time. LDH levels are normalized . Patient has no clinical symptoms of disease progression and feels very well .  2) Retinitis pigmentosa causing legal blindness - has been set up and is seeing a retina specialist in town. No acute interventions at this time.  3) Weight loss and protein calorie malnutrition. Resolving. Weight has improved significantly since his diagnosis and back to his baseline weight. He is eating well. . Wt Readings from Last 3 Encounters:  06/18/16 199 lb 6.4 oz (90.4 kg)  05/30/16 193 lb 4.8 oz (87.7 kg)  03/19/16 183 lb 9.6 oz (83.3 kg)    4) multiple scab like/nodular skin lesions ? Due to pruritus and scratching with likely skin involvement by lymphoma. These have resolved .  5) Peri-orbital dermatitis /Conjuncitivitis - patient was seen by the dermatologist and given topical desonide ointment which led to the resolution of his dermatitis. There were not sure about the etiology but noted then to could not rule out Rituxan is a possibility. Currently his dermatitis has resolved and his conjunctivitis has resolved. He did not have rash anywhere else at the time. No oral sores or other toxicities. This happened about 4 days after Rituxan another chemotherapy. This has completely resolved and patient has not had an further issues with this.  PLAN -patient has no clinical evidence of lymphoma recurrence at this time. He feels well. LDH remains WNL. -After discussing the pros and cons including his potential delay his peri-orbital skin reaction the patient provides informed consent to  proceed with maintenance Rituxan therapy. -we shall do optimal premedications prior to Rituxan and she'll have been on dexamethasone 4 mg twice a day for 5 days after his dose of Rituxan to try to mitigate any allergic reactions. -He was also counseled to take over-the-counter cetirizine 10mg  daily along with famotidine 40 mg daily -We will do standard protocol Rituxan and not the rapid protocol given uncertainty about reaction.  -The patient has any repeat reaction to the Rituxan would need to discontinue this. -continue f/u with PCP for other continued medical issues.  Return to care with Dr. Irene Limbo in 8 weeks with labs and appointment for next cycle of maintenance Rituxan. Earlier if any new concerns.  I spent 25 minutes counseling the patient face to face. The total time  spent in the appointment was 25 minutes and more than 50% was on counseling and direct patient cares. Patient was accompanied by his son and daughter for this visit .    Sullivan Lone MD [place] AAHIVMS [facility] Hematology/Oncology Physician [facility]  (Office):       [phone] (Work cell):  [phone] (Fax):           [phone]

## 2016-06-26 NOTE — Patient Instructions (Signed)
[facility] Discharge Instructions for Patients Receiving Chemotherapy  Today you received the following chemotherapy agents: Rituxan   To help prevent nausea and vomiting after your treatment, we encourage you to take your nausea medication as directed.    If you develop nausea and vomiting that is not controlled by your nausea medication, call the clinic.   BELOW ARE SYMPTOMS THAT SHOULD BE REPORTED IMMEDIATELY:  *FEVER GREATER THAN 100.5 F  *CHILLS WITH OR WITHOUT FEVER  NAUSEA AND VOMITING THAT IS NOT CONTROLLED WITH YOUR NAUSEA MEDICATION  *UNUSUAL SHORTNESS OF BREATH  *UNUSUAL BRUISING OR BLEEDING  TENDERNESS IN MOUTH AND THROAT WITH OR WITHOUT PRESENCE OF ULCERS  *URINARY PROBLEMS  *BOWEL PROBLEMS  UNUSUAL RASH Items with * indicate a potential emergency and should be followed up as soon as possible.  Feel free to call the clinic you have any questions or concerns. The clinic phone number is [phone].  Please show the CHEMO ALERT CARD at check-in to the Emergency Department and triage nurse.   

## 2016-06-26 NOTE — Telephone Encounter (Signed)
Spoke with Mr. Kristopher Johnson via telephone regarding prescription for dexamethasone.  Informed Mr. Madeja he would need to pick-up script and take as prescribed.  Patient states he didn't pick-up the prescription.  States, "I've never taken it, and I'm not going to take it now."  [employer] notified.  Same will notify Dr. Irene Limbo.

## 2016-06-26 NOTE — Progress Notes (Signed)
Per Ann Lions, RN, Dr. Irene Limbo ok'd using labs from 05/30/2016.

## 2016-09-06 ENCOUNTER — Ambulatory Visit (HOSPITAL_BASED_OUTPATIENT_CLINIC_OR_DEPARTMENT_OTHER): Payer: Medicare Other

## 2016-09-06 ENCOUNTER — Ambulatory Visit (HOSPITAL_BASED_OUTPATIENT_CLINIC_OR_DEPARTMENT_OTHER): Payer: Medicare Other | Admitting: Hematology

## 2016-09-06 ENCOUNTER — Other Ambulatory Visit (HOSPITAL_BASED_OUTPATIENT_CLINIC_OR_DEPARTMENT_OTHER): Payer: Medicare Other

## 2016-09-06 ENCOUNTER — Encounter: Payer: Self-pay | Admitting: Hematology

## 2016-09-06 VITALS — BP 151/81 | HR 72 | Temp 98.2°F | Resp 16 | Wt 207.0 lb

## 2016-09-06 VITALS — BP 129/70 | HR 79 | Temp 98.8°F | Resp 18

## 2016-09-06 DIAGNOSIS — H3552 Pigmentary retinal dystrophy: Secondary | ICD-10-CM

## 2016-09-06 DIAGNOSIS — C8318 Mantle cell lymphoma, lymph nodes of multiple sites: Secondary | ICD-10-CM

## 2016-09-06 DIAGNOSIS — Z5112 Encounter for antineoplastic immunotherapy: Secondary | ICD-10-CM | POA: Diagnosis present

## 2016-09-06 DIAGNOSIS — Z720 Tobacco use: Secondary | ICD-10-CM

## 2016-09-06 LAB — COMPREHENSIVE METABOLIC PANEL
ALBUMIN: 3.6 g/dL (ref 3.5–5.0)
ALK PHOS: 85 U/L (ref 40–150)
ALT: 26 U/L (ref 0–55)
AST: 15 U/L (ref 5–34)
Anion Gap: 9 mEq/L (ref 3–11)
BILIRUBIN TOTAL: 0.44 mg/dL (ref 0.20–1.20)
BUN: 12.1 mg/dL (ref 7.0–26.0)
CO2: 26 mEq/L (ref 22–29)
CREATININE: 0.9 mg/dL (ref 0.7–1.3)
Calcium: 9.3 mg/dL (ref 8.4–10.4)
Chloride: 106 mEq/L (ref 98–109)
EGFR: >90 mL/min/{1.73_m2} (ref >90)
GLUCOSE: 149 mg/dL — AB (ref 70–140)
Potassium: 3.9 mEq/L (ref 3.5–5.1)
SODIUM: 141 meq/L (ref 136–145)
TOTAL PROTEIN: 6.7 g/dL (ref 6.4–8.3)

## 2016-09-06 LAB — CBC & DIFF AND RETIC
BASO%: 0.6 % (ref 0.0–2.0)
BASOS ABS: 0.1 10*3/uL (ref 0.0–0.1)
EOS ABS: 0.3 10*3/uL (ref 0.0–0.5)
EOS%: 3.6 % (ref 0.0–7.0)
HEMATOCRIT: 45.3 % (ref 38.4–49.9)
HEMOGLOBIN: 14.5 g/dL (ref 13.0–17.1)
IMMATURE RETIC FRACT: 4.2 % (ref 3.00–10.60)
LYMPH%: 9.9 % — ABNORMAL LOW (ref 14.0–49.0)
MCH: 29.4 pg (ref 27.2–33.4)
MCHC: 32 g/dL (ref 32.0–36.0)
MCV: 91.7 fL (ref 79.3–98.0)
MONO#: 0.8 10*3/uL (ref 0.1–0.9)
MONO%: 10.3 % (ref 0.0–14.0)
NEUT%: 75.6 % — ABNORMAL HIGH (ref 39.0–75.0)
NEUTROS ABS: 5.8 10*3/uL (ref 1.5–6.5)
Platelets: 151 10*3/uL (ref 140–400)
RBC: 4.94 10*6/uL (ref 4.20–5.82)
RDW: 14.9 % — AB (ref 11.0–14.6)
RETIC %: 1.06 % (ref 0.80–1.80)
Retic Ct Abs: 52.36 10*3/uL (ref 34.80–93.90)
WBC: 7.7 10*3/uL (ref 4.0–10.3)
lymph#: 0.8 10*3/uL — ABNORMAL LOW (ref 0.9–3.3)

## 2016-09-06 LAB — LACTATE DEHYDROGENASE: LDH: 127 U/L (ref 125–245)

## 2016-09-06 MED ORDER — SODIUM CHLORIDE 0.9 % IV SOLN
375.0000 mg/m2 | Freq: Once | INTRAVENOUS | Status: AC
  Administered 2016-09-06: 800 mg via INTRAVENOUS
  Filled 2016-09-06: qty 50

## 2016-09-06 MED ORDER — SODIUM CHLORIDE 0.9% FLUSH
10.0000 mL | INTRAVENOUS | Status: DC | PRN
  Administered 2016-09-06: 10 mL
  Filled 2016-09-06: qty 10

## 2016-09-06 MED ORDER — FAMOTIDINE IN NACL 20-0.9 MG/50ML-% IV SOLN
INTRAVENOUS | Status: AC
  Filled 2016-09-06: qty 50

## 2016-09-06 MED ORDER — DEXAMETHASONE SODIUM PHOSPHATE 10 MG/ML IJ SOLN
INTRAMUSCULAR | Status: AC
  Filled 2016-09-06: qty 1

## 2016-09-06 MED ORDER — HEPARIN SOD (PORK) LOCK FLUSH 100 UNIT/ML IV SOLN
500.0000 [IU] | Freq: Once | INTRAVENOUS | Status: AC | PRN
  Administered 2016-09-06: 500 [IU]
  Filled 2016-09-06: qty 5

## 2016-09-06 MED ORDER — SODIUM CHLORIDE 0.9 % IV SOLN
Freq: Once | INTRAVENOUS | Status: AC
  Administered 2016-09-06: 10:00:00 via INTRAVENOUS

## 2016-09-06 MED ORDER — ACETAMINOPHEN 325 MG PO TABS
ORAL_TABLET | ORAL | Status: AC
  Filled 2016-09-06: qty 2

## 2016-09-06 MED ORDER — ACETAMINOPHEN 325 MG PO TABS
650.0000 mg | ORAL_TABLET | Freq: Once | ORAL | Status: AC
  Administered 2016-09-06: 650 mg via ORAL

## 2016-09-06 MED ORDER — DEXAMETHASONE SODIUM PHOSPHATE 10 MG/ML IJ SOLN
10.0000 mg | Freq: Once | INTRAMUSCULAR | Status: AC
  Administered 2016-09-06: 10 mg via INTRAVENOUS

## 2016-09-06 MED ORDER — DIPHENHYDRAMINE HCL 25 MG PO CAPS
50.0000 mg | ORAL_CAPSULE | Freq: Once | ORAL | Status: AC
  Administered 2016-09-06: 50 mg via ORAL

## 2016-09-06 MED ORDER — FAMOTIDINE IN NACL 20-0.9 MG/50ML-% IV SOLN
20.0000 mg | Freq: Two times a day (BID) | INTRAVENOUS | Status: DC
  Administered 2016-09-06: 20 mg via INTRAVENOUS

## 2016-09-06 MED ORDER — DIPHENHYDRAMINE HCL 25 MG PO CAPS
ORAL_CAPSULE | ORAL | Status: AC
  Filled 2016-09-06: qty 2

## 2016-09-06 MED ORDER — SODIUM CHLORIDE 0.9 % IV SOLN
10.0000 mg | Freq: Once | INTRAVENOUS | Status: DC
  Filled 2016-09-06: qty 1

## 2016-09-06 NOTE — Progress Notes (Signed)
Kristopher Johnson Kitchen    HEMATOLOGY/ONCOLOGY CLINIC NOTE  Date of Service: .09/06/2016   Patient Care Team: No Pcp Per Patient as PCP - General (General Practice)  CHIEF COMPLAINTS/PURPOSE OF CONSULTATION:   followup for New Diagnosed Mantle Cell lymphoma  DIAGNOSIS Stage IV Mantle cell lymphoma diagnosed in 10/25/2015  Current TREATMENT   Planned Rituxan maintenance q 71months  PREVIOUS TREATMENT  Status post R CHOP 6 cycles . Patient chooses not to pursue AutoHSCT based strategies which is quite reasonable considering his functional challenges with legal blindness and limited social support.   HISTORY OF PRESENTING ILLNESS: plz see clinic note for details on initial presentation  Interval History  Kristopher Johnson is here for follow-up of his mantle cell lymphoma after completing 6 cycles of R CHOP chemotherapy.  Patient notes that he is feeling good and has gained another 7-8 pounds of weight. Eating well. No fevers or chills. No peripheral lymphadenopathy. Tolerated his first cycle of maintenance Rituxan without any other reactions. He was prescribed dexamethasone for 5 days after his Rituxan since he had some Peri-orbital dermatitis /Conjuncitivitis with his last Rituxan dose with chemotherapy which was uncertain if it was related to Rituxan. Patient chose not to take the dexamethasone and did not have any reaction to the Rituxan maintenance. He notes that he would not take it even if we ordered that today. No other acute new symptoms at this time. He is okay with proceeding with the second dose of maintenance Rituxan. He notes progressively worsening vision with his retinitis pigmentosa that he reports is significantly limiting his activities. He feels somewhat down about this and we spent some time talking about his feelings. He does not note overt depression or hopelessness and has been eating well and sleeping well but wonders how he can keep himself busy.   MEDICAL HISTORY:  Past Medical  History:  Diagnosis Date  . Abscess of arm, right 10/27/2015  . Lymphoma of lymph nodes of multiple sites ([place]) 10/27/2015  . 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 Shortness of breath 10/27/2015    SURGICAL HISTORY: Past Surgical History:  Procedure Laterality Date  . Cataract surgery     Patient notes she has had bilateral Surgery in 1998 and 99  . TONSILLECTOMY     about 60years old    SOCIAL HISTORY: Social History   Social History  . Marital status: Widowed    Spouse name: N/A  . Number of children: N/A  . Years of education: N/A   Occupational History  . Not on file.   Social History Main Topics  . Smoking status: Current Every Day Smoker    Packs/day: 1.00    Years: 33.00  . Smokeless tobacco: Never Used  . Alcohol use No  . Drug use: No  . Sexual activity: Not on file     Comment: Disabled, retinis pigmentosa legally blind, 4children MPOA Lynnn(cousin)   Other Topics Concern  . Not on file   Social History Narrative  . No narrative on file    FAMILY HISTORY: History reviewed. No pertinent family history.  ALLERGIES:  has No Known Allergies.  MEDICATIONS:  Current Outpatient Prescriptions  Medication Sig Dispense Refill  . chlorhexidine (PERIDEX) 0.12 % solution Use as directed 15 mLs in the mouth or throat 4 (four) times daily. 240 mL 2  . desonide (DESOWEN) 0.05 % cream APPLY TO THE AFFECTED AREA(S) ON  FACE UP TO TWICE DAILY  0  . lidocaine-prilocaine (EMLA) cream Apply 1 application topically as needed. 30 g 6  . ondansetron (ZOFRAN) 8 MG tablet Take 1 tablet (8 mg total) by mouth 2 (two) times daily as needed for refractory nausea / vomiting. 30 tablet 1  . prochlorperazine (COMPAZINE) 10 MG tablet Take 1 tablet (10 mg total) by mouth every 6 (six) hours as needed (Nausea or vomiting). 30 tablet 6   No current facility-administered medications for this visit.      REVIEW OF SYSTEMS:    10 Point review of Systems was done is negative except as noted above.  PHYSICAL EXAMINATION: ECOG PERFORMANCE STATUS: 2 - Symptomatic, <50% confined to bed .BP (!) 151/81 (BP Location: Right Arm, Patient Position: Sitting)   Pulse 72   Temp 98.2 F (36.8 C) (Oral)   Resp 16   Wt 207 lb (93.9 kg)   SpO2 98%   BMI 30.57 kg/m   GENERAL:alert, in no acute distress and comfortable SKIN: some nodular skin lesions over extremities and back, beginning crusted lesions over his right scapular area. EYES: normal, conjunctiva are pink and non-injected, sclera clear. Poor visual acuity - some directional light perception but difficulty with finger counting. OROPHARYNX:no exudate, no erythema and lips, multiple carious teeth with significant periodontal disease. NECK: supple, no JVD, thyroid normal size, non-tender, without nodularity LYMPH:  no palpable lymphadenopathy in the cervical, axillary or inguinal LUNGS: clear to auscultation with normal respiratory effort HEART: regular rate & rhythm,  no murmurs and no lower extremity edema ABDOMEN: abdomen soft, non-tender, normoactive bowel sounds  Musculoskeletal: no cyanosis of digits and no clubbing  PSYCH: alert & oriented x 3 with fluent speech NEURO: no focal motor/sensory deficits  LABORATORY DATA:  I have reviewed the data as listed   CBC Latest Ref Rng & Units 09/06/2016 05/30/2016 02/20/2016  WBC 4.0 - 10.3 10e3/uL 7.7 6.8 6.4  Hemoglobin 13.0 - 17.1 g/dL 14.5 14.1 12.6(L)  Hematocrit 38.4 - 49.9 % 45.3 44.2 39.7  Platelets 140 - 400 10e3/uL 151 156 214    . CMP Latest Ref Rng & Units 09/06/2016 05/30/2016 02/20/2016  Glucose 70 - 140 mg/dl 149(H) 126 103  BUN 7.0 - 26.0 mg/dL 12.1 13.6 15.1  Creatinine 0.7 - 1.3 mg/dL 0.9 0.9 0.9  Sodium 136 - 145 mEq/L 141 141 142  Potassium 3.5 - 5.1 mEq/L 3.9 3.9 3.9  Chloride 101 - 111 mmol/L - - -  CO2 22 - 29 mEq/L 26 29 29   Calcium 8.4 - 10.4 mg/dL 9.3 9.4  9.3  Total Protein 6.4 - 8.3 g/dL 6.7 6.7 6.4  Total Bilirubin 0.20 - 1.20 mg/dL 0.44 0.40 <0.30  Alkaline Phos 40 - 150 U/L 85 76 55  AST 5 - 34 U/L 15 14 13   ALT 0 - 55 U/L 26 16 14     Lab Results  Component Value Date   LDH 163 05/30/2016    Uric acid 7.1   RADIOGRAPHIC STUDIES: I have personally reviewed the radiological images as listed and agreed with the findings in the report. No results found.  ECHO: 11/01/2015: Study Conclusions  - Left ventricle: The cavity size was normal. There was mild concentric hypertrophy. Systolic function was normal. The estimated ejection fraction was in the range of 50% to 55%. Wall motion was normal; there were no regional wall motion abnormalities. Doppler parameters are consistent with abnormal left ventricular relaxation (grade 1 diastolic dysfunction). - Aortic valve: Transvalvular velocity was  within the normal range. There was no stenosis. There was no regurgitation. - Mitral valve: There was no regurgitation. - Right ventricle: The cavity size was normal. Wall thickness was normal. Systolic function was normal. - Tricuspid valve: There was no regurgitation. - Inferior vena cava: The vessel was normal in size. The respirophasic diameter changes were in the normal range (>= 50%), consistent with normal central venous pressure.Study Conclusions  - Left ventricle: The cavity size was normal. There was mild concentric hypertrophy. Systolic function was normal. The estimated ejection fraction was in the range of 50% to 55%. Wall motion was normal; there were no regional wall motion abnormalities. Doppler parameters are consistent with abnormal left ventricular relaxation (grade 1 diastolic dysfunction). - Aortic valve: Transvalvular velocity was within the normal range. There was no stenosis. There was no regurgitation. - Mitral valve: There was no regurgitation. - Right ventricle: The cavity size was  normal. Wall thickness was normal. Systolic function was normal. - Tricuspid valve: There was no regurgitation. - Inferior vena cava: The vessel was normal in size. The respirophasic diameter changes were in the normal range (>= 50%), consistent with normal central venous pressure.    ASSESSMENT & PLAN:    60 yo man with   1) Stage IVB E Mantle Cell lymphoma with likely gastric involvement, extensive LNadenopathy. ECHO nl EF ECOG PS 1-2 but activities significantly limited due to being legally blind HIV/Hep C/Hep B neg Baseline LDH 450--->135-->163  PET/CT scan after 3 cycles of R CHOP show good overall response. Stomach lesion still with very FDG uptake ? Residual lymphoma versus inflammation.   Patient is status post 6 cycles of R CHOP. PET/CT after 6 cycles shows resolution of hypermetabolic lymphadenopathy and gastric wall activity . Patient appears to be in CR at this time. LDH levels are normalized . Patient has no clinical symptoms of disease progression and feels very well .  2) Retinitis pigmentosa causing legal blindness - has been set up and is seeing a retina specialist in town. No acute interventions at this time.  3) Weight loss and protein calorie malnutrition. Resolving. Weight has improved significantly since his diagnosis and back to his baseline weight. He is eating well. . Wt Readings from Last 3 Encounters:  09/06/16 207 lb (93.9 kg)  06/18/16 199 lb 6.4 oz (90.4 kg)  05/30/16 193 lb 4.8 oz (87.7 kg)    4) multiple scab like/nodular skin lesions ? Due to pruritus and scratching with likely skin involvement by lymphoma. These have resolved .  5) Peri-orbital dermatitis /Conjuncitivitis - patient was seen by the dermatologist and given topical desonide ointment which led to the resolution of his dermatitis. There were not sure about the etiology but noted then to could not rule out Rituxan is a possibility. Currently his dermatitis has resolved and  his conjunctivitis has resolved. He did not have rash anywhere else at the time. No oral sores or other toxicities. This happened about 4 days after Rituxan another chemotherapy. This has completely resolved and patient has not had an further issues with this.  Patient tolerated his first dose of maintenance Rituxan after this. We had planned to give him dexamethasone for 5 days after his Rituxan with the patient refused to take this and still did not have a reaction. It seems to be less likely related to Rituxan at this time.   PLAN -patient has no clinical evidence of lymphoma recurrence at this time. He feels well.  -We'll proceed with his second  cycle of maintenance Rituxan today. -Since he did not have any periorbital skin reaction despite refusing to take his dexamethasone with his previous cycle we'll continue without any posttreatment dexamethasone with this cycle.  -Patient notes that he would not take posttreatment dexamethasone even if ordered. -He was counseled to take over-the-counter cetirizine 10mg  daily along with famotidine 40 mg daily -We will do standard protocol Rituxan and not the rapid protocol  -continue f/u with PCP for other continued medical issues.  6) retinitis pigmentosa with progressive vision loss. Counseled to continue follow-up with his ophthalmologist Fall precautions Counseled to try to maintain physical activity level within his limitations to try to help with his sense of purpose.  Return to care with Dr. Irene Limbo in 8 weeks with labs and appointment for next cycle of maintenance Rituxan. Earlier if any new concerns.  I spent 25 minutes counseling the patient face to face. The total time spent in the appointment was 25 minutes and more than 50% was on counseling and direct patient cares. Patient was accompanied by his son and daughter for this visit .    Sullivan Lone MD [place] AAHIVMS [facility] Hematology/Oncology Physician [facility]  (Office):        [phone] (Work cell):  [phone] (Fax):           [phone]

## 2016-09-06 NOTE — Patient Instructions (Signed)
[facility] Discharge Instructions for Patients Receiving Chemotherapy  Today you received the following chemotherapy agents Rituxan  To help prevent nausea and vomiting after your treatment, we encourage you to take your nausea medication as needed   If you develop nausea and vomiting that is not controlled by your nausea medication, call the clinic.   BELOW ARE SYMPTOMS THAT SHOULD BE REPORTED IMMEDIATELY:  *FEVER GREATER THAN 100.5 F  *CHILLS WITH OR WITHOUT FEVER  NAUSEA AND VOMITING THAT IS NOT CONTROLLED WITH YOUR NAUSEA MEDICATION  *UNUSUAL SHORTNESS OF BREATH  *UNUSUAL BRUISING OR BLEEDING  TENDERNESS IN MOUTH AND THROAT WITH OR WITHOUT PRESENCE OF ULCERS  *URINARY PROBLEMS  *BOWEL PROBLEMS  UNUSUAL RASH Items with * indicate a potential emergency and should be followed up as soon as possible.  Feel free to call the clinic you have any questions or concerns. The clinic phone number is [phone].  Please show the CHEMO ALERT CARD at check-in to the Emergency Department and triage nurse.   

## 2016-09-17 ENCOUNTER — Telehealth: Payer: Self-pay | Admitting: General Practice

## 2016-09-17 NOTE — Telephone Encounter (Signed)
Left msg regarding Oct 27, 2016 appts.

## 2016-11-06 ENCOUNTER — Other Ambulatory Visit (HOSPITAL_BASED_OUTPATIENT_CLINIC_OR_DEPARTMENT_OTHER): Payer: Medicare Other

## 2016-11-06 ENCOUNTER — Ambulatory Visit (HOSPITAL_BASED_OUTPATIENT_CLINIC_OR_DEPARTMENT_OTHER): Payer: Medicare Other | Admitting: Hematology

## 2016-11-06 ENCOUNTER — Ambulatory Visit (HOSPITAL_BASED_OUTPATIENT_CLINIC_OR_DEPARTMENT_OTHER): Payer: Medicare Other

## 2016-11-06 ENCOUNTER — Encounter: Payer: Self-pay | Admitting: Hematology

## 2016-11-06 VITALS — BP 122/67 | HR 71 | Temp 98.1°F | Resp 16

## 2016-11-06 VITALS — BP 143/78 | HR 73 | Temp 97.8°F | Resp 17 | Ht 69.0 in | Wt 209.0 lb

## 2016-11-06 DIAGNOSIS — Z5112 Encounter for antineoplastic immunotherapy: Secondary | ICD-10-CM | POA: Diagnosis not present

## 2016-11-06 DIAGNOSIS — H3552 Pigmentary retinal dystrophy: Secondary | ICD-10-CM

## 2016-11-06 DIAGNOSIS — C8318 Mantle cell lymphoma, lymph nodes of multiple sites: Secondary | ICD-10-CM

## 2016-11-06 DIAGNOSIS — Z95828 Presence of other vascular implants and grafts: Secondary | ICD-10-CM

## 2016-11-06 LAB — COMPREHENSIVE METABOLIC PANEL
ALT: 22 U/L (ref 0–55)
ANION GAP: 8 meq/L (ref 3–11)
AST: 13 U/L (ref 5–34)
Albumin: 3.8 g/dL (ref 3.5–5.0)
Alkaline Phosphatase: 93 U/L (ref 40–150)
BUN: 16.3 mg/dL (ref 7.0–26.0)
CALCIUM: 9.5 mg/dL (ref 8.4–10.4)
CHLORIDE: 103 meq/L (ref 98–109)
CO2: 29 mEq/L (ref 22–29)
Creatinine: 1.1 mg/dL (ref 0.7–1.3)
EGFR: 82 mL/min/{1.73_m2} — AB (ref >90)
Glucose: 197 mg/dl — ABNORMAL HIGH (ref 70–140)
POTASSIUM: 4.3 meq/L (ref 3.5–5.1)
Sodium: 140 mEq/L (ref 136–145)
Total Bilirubin: 0.42 mg/dL (ref 0.20–1.20)
Total Protein: 6.9 g/dL (ref 6.4–8.3)

## 2016-11-06 LAB — CBC & DIFF AND RETIC
BASO%: 0.7 % (ref 0.0–2.0)
Basophils Absolute: 0.1 10*3/uL (ref 0.0–0.1)
EOS%: 3.8 % (ref 0.0–7.0)
Eosinophils Absolute: 0.3 10*3/uL (ref 0.0–0.5)
HEMATOCRIT: 46.5 % (ref 38.4–49.9)
HGB: 14.9 g/dL (ref 13.0–17.1)
Immature Retic Fract: 7.3 % (ref 3.00–10.60)
LYMPH#: 0.7 10*3/uL — AB (ref 0.9–3.3)
LYMPH%: 8.3 % — ABNORMAL LOW (ref 14.0–49.0)
MCH: 29.7 pg (ref 27.2–33.4)
MCHC: 32 g/dL (ref 32.0–36.0)
MCV: 92.8 fL (ref 79.3–98.0)
MONO#: 1.1 10*3/uL — ABNORMAL HIGH (ref 0.1–0.9)
MONO%: 12.8 % (ref 0.0–14.0)
NEUT#: 6.1 10*3/uL (ref 1.5–6.5)
NEUT%: 74.4 % (ref 39.0–75.0)
Platelets: 192 10*3/uL (ref 140–400)
RBC: 5.01 10*6/uL (ref 4.20–5.82)
RDW: 14 % (ref 11.0–14.6)
RETIC %: 1.22 % (ref 0.80–1.80)
Retic Ct Abs: 61.12 10*3/uL (ref 34.80–93.90)
WBC: 8.2 10*3/uL (ref 4.0–10.3)
nRBC: 0 % (ref 0–0)

## 2016-11-06 LAB — LACTATE DEHYDROGENASE: LDH: 164 U/L (ref 125–245)

## 2016-11-06 MED ORDER — ACETAMINOPHEN 325 MG PO TABS
ORAL_TABLET | ORAL | Status: AC
  Filled 2016-11-06: qty 2

## 2016-11-06 MED ORDER — DEXAMETHASONE SODIUM PHOSPHATE 10 MG/ML IJ SOLN
10.0000 mg | Freq: Once | INTRAMUSCULAR | Status: AC
  Administered 2016-11-06: 10 mg via INTRAVENOUS

## 2016-11-06 MED ORDER — FAMOTIDINE IN NACL 20-0.9 MG/50ML-% IV SOLN
20.0000 mg | Freq: Two times a day (BID) | INTRAVENOUS | Status: DC
  Administered 2016-11-06: 20 mg via INTRAVENOUS

## 2016-11-06 MED ORDER — SODIUM CHLORIDE 0.9 % IV SOLN: 10.0000 mg | Freq: Once | INTRAVENOUS | Status: DC

## 2016-11-06 MED ORDER — SODIUM CHLORIDE 0.9 % IV SOLN
Freq: Once | INTRAVENOUS | Status: AC
  Administered 2016-11-06: 10:00:00 via INTRAVENOUS

## 2016-11-06 MED ORDER — DIPHENHYDRAMINE HCL 25 MG PO CAPS
ORAL_CAPSULE | ORAL | Status: AC
  Filled 2016-11-06: qty 2

## 2016-11-06 MED ORDER — FAMOTIDINE IN NACL 20-0.9 MG/50ML-% IV SOLN
INTRAVENOUS | Status: AC
  Filled 2016-11-06: qty 50

## 2016-11-06 MED ORDER — DIPHENHYDRAMINE HCL 25 MG PO CAPS
50.0000 mg | ORAL_CAPSULE | Freq: Once | ORAL | Status: AC
  Administered 2016-11-06: 50 mg via ORAL

## 2016-11-06 MED ORDER — RITUXIMAB CHEMO INJECTION 500 MG/50ML
375.0000 mg/m2 | Freq: Once | INTRAVENOUS | Status: AC
  Administered 2016-11-06: 800 mg via INTRAVENOUS
  Filled 2016-11-06: qty 50

## 2016-11-06 MED ORDER — SODIUM CHLORIDE 0.9% FLUSH
10.0000 mL | INTRAVENOUS | Status: DC | PRN
  Administered 2016-11-06: 10 mL
  Filled 2016-11-06: qty 10

## 2016-11-06 MED ORDER — ACETAMINOPHEN 325 MG PO TABS
650.0000 mg | ORAL_TABLET | Freq: Once | ORAL | Status: AC
  Administered 2016-11-06: 650 mg via ORAL

## 2016-11-06 MED ORDER — DEXAMETHASONE SODIUM PHOSPHATE 10 MG/ML IJ SOLN
INTRAMUSCULAR | Status: AC
  Filled 2016-11-06: qty 1

## 2016-11-06 MED ORDER — HEPARIN SOD (PORK) LOCK FLUSH 100 UNIT/ML IV SOLN
500.0000 [IU] | Freq: Once | INTRAVENOUS | Status: AC | PRN
  Administered 2016-11-06: 500 [IU]
  Filled 2016-11-06: qty 5

## 2016-11-06 NOTE — Patient Instructions (Signed)
[facility] Discharge Instructions for Patients Receiving Chemotherapy  Today you received the following chemotherapy agents Rituxan  To help prevent nausea and vomiting after your treatment, we encourage you to take your nausea medication as needed   If you develop nausea and vomiting that is not controlled by your nausea medication, call the clinic.   BELOW ARE SYMPTOMS THAT SHOULD BE REPORTED IMMEDIATELY:  *FEVER GREATER THAN 100.5 F  *CHILLS WITH OR WITHOUT FEVER  NAUSEA AND VOMITING THAT IS NOT CONTROLLED WITH YOUR NAUSEA MEDICATION  *UNUSUAL SHORTNESS OF BREATH  *UNUSUAL BRUISING OR BLEEDING  TENDERNESS IN MOUTH AND THROAT WITH OR WITHOUT PRESENCE OF ULCERS  *URINARY PROBLEMS  *BOWEL PROBLEMS  UNUSUAL RASH Items with * indicate a potential emergency and should be followed up as soon as possible.  Feel free to call the clinic you have any questions or concerns. The clinic phone number is [phone].  Please show the CHEMO ALERT CARD at check-in to the Emergency Department and triage nurse.   

## 2016-11-11 NOTE — Progress Notes (Signed)
Kristopher Johnson Kitchen    HEMATOLOGY/ONCOLOGY CLINIC NOTE  Date of Service: .11/06/2016   Patient Care Team: No Pcp Per Patient as PCP - General (General Practice)  CHIEF COMPLAINTS/PURPOSE OF CONSULTATION:   followup for New Diagnosed Mantle Cell lymphoma  DIAGNOSIS Stage IV Mantle cell lymphoma diagnosed in 10/25/2015  Current TREATMENT   Planned Rituxan maintenance q 75months  PREVIOUS TREATMENT  Status post R CHOP 6 cycles . Patient chooses not to pursue AutoHSCT based strategies which is quite reasonable considering his functional challenges with legal blindness and limited social support.   HISTORY OF PRESENTING ILLNESS: plz see clinic note for details on initial presentation  Interval History  Kristopher Johnson is here for follow-up of his mantle cell lymphoma and his next cycle of maintenance Rituxan. He notes no concerning new symptoms. He had a good holiday season. Has been eating well and notes that his weight has been stable. No new LNadenopathy. No fevers/chills/ nightsweats. No other concerning focal symptoms.  MEDICAL HISTORY:  Past Medical History:  Diagnosis Date  . Abscess of arm, right 10/27/2015  . Lymphoma of lymph nodes of multiple sites ([place]) 10/27/2015  . 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 Shortness of breath 10/27/2015    SURGICAL HISTORY: Past Surgical History:  Procedure Laterality Date  . Cataract surgery     Patient notes she has had bilateral Surgery in 1998 and 99  . TONSILLECTOMY     about 61years old    SOCIAL HISTORY: Social History   Social History  . Marital status: Widowed    Spouse name: N/A  . Number of children: N/A  . Years of education: N/A   Occupational History  . Not on file.   Social History Main Topics  . Smoking status: Current Every Day Smoker    Packs/day: 1.00    Years: 33.00  . Smokeless tobacco: Never Used  . Alcohol use No  . Drug  use: No  . Sexual activity: Not on file     Comment: Disabled, retinis pigmentosa legally blind, 4children MPOA Lynnn(cousin)   Other Topics Concern  . Not on file   Social History Narrative  . No narrative on file    FAMILY HISTORY: History reviewed. No pertinent family history.  ALLERGIES:  has No Known Allergies.  MEDICATIONS:  Current Outpatient Prescriptions  Medication Sig Dispense Refill  . lidocaine-prilocaine (EMLA) cream Apply 1 application topically as needed. (Patient not taking: Reported on 11/06/2016) 30 g 6  . ondansetron (ZOFRAN) 8 MG tablet Take 1 tablet (8 mg total) by mouth 2 (two) times daily as needed for refractory nausea / vomiting. (Patient not taking: Reported on 11/06/2016) 30 tablet 1  . prochlorperazine (COMPAZINE) 10 MG tablet Take 1 tablet (10 mg total) by mouth every 6 (six) hours as needed (Nausea or vomiting). (Patient not taking: Reported on 11/06/2016) 30 tablet 6   No current facility-administered medications for this visit.     REVIEW OF SYSTEMS:    10 Point review of Systems was done is negative except as noted above.  PHYSICAL EXAMINATION: ECOG PERFORMANCE STATUS: 2 - Symptomatic, <50% confined to bed .BP (!) 143/78 (BP Location: Left Arm, Patient Position: Sitting)   Pulse 73   Temp 97.8 F (36.6 C) (Oral)   Resp 17   Ht 5\' 9"  (1.753 m)   Wt 209 lb (94.8 kg)   SpO2 98%   BMI  30.86 kg/m   GENERAL:alert, in no acute distress and comfortable SKIN: some nodular skin lesions over extremities and back, beginning crusted lesions over his right scapular area. EYES: normal, conjunctiva are pink and non-injected, sclera clear. Poor visual acuity - some directional light perception but difficulty with finger counting. OROPHARYNX:no exudate, no erythema and lips, multiple carious teeth with significant periodontal disease. NECK: supple, no JVD, thyroid normal size, non-tender, without nodularity LYMPH:  no palpable lymphadenopathy in the  cervical, axillary or inguinal LUNGS: clear to auscultation with normal respiratory effort HEART: regular rate & rhythm,  no murmurs and no lower extremity edema ABDOMEN: abdomen soft, non-tender, normoactive bowel sounds  Musculoskeletal: no cyanosis of digits and no clubbing  PSYCH: alert & oriented x 3 with fluent speech NEURO: no focal motor/sensory deficits  LABORATORY DATA:  I have reviewed the data as listed   CBC Latest Ref Rng & Units 11/06/2016 09/06/2016 05/30/2016  WBC 4.0 - 10.3 10e3/uL 8.2 7.7 6.8  Hemoglobin 13.0 - 17.1 g/dL 14.9 14.5 14.1  Hematocrit 38.4 - 49.9 % 46.5 45.3 44.2  Platelets 140 - 400 10e3/uL 192 151 156    . CMP Latest Ref Rng & Units 11/06/2016 09/06/2016 05/30/2016  Glucose 70 - 140 mg/dl 197(H) 149(H) 126  BUN 7.0 - 26.0 mg/dL 16.3 12.1 13.6  Creatinine 0.7 - 1.3 mg/dL 1.1 0.9 0.9  Sodium 136 - 145 mEq/L 140 141 141  Potassium 3.5 - 5.1 mEq/L 4.3 3.9 3.9  Chloride 101 - 111 mmol/L - - -  CO2 22 - 29 mEq/L 29 26 29   Calcium 8.4 - 10.4 mg/dL 9.5 9.3 9.4  Total Protein 6.4 - 8.3 g/dL 6.9 6.7 6.7  Total Bilirubin 0.20 - 1.20 mg/dL 0.42 0.44 0.40  Alkaline Phos 40 - 150 U/L 93 85 76  AST 5 - 34 U/L 13 15 14   ALT 0 - 55 U/L 22 26 16     Lab Results  Component Value Date   LDH 164 11/06/2016    Uric acid 7.1   RADIOGRAPHIC STUDIES: I have personally reviewed the radiological images as listed and agreed with the findings in the report. No results found.  ECHO: 11/01/2015: Study Conclusions  - Left ventricle: The cavity size was normal. There was mild concentric hypertrophy. Systolic function was normal. The estimated ejection fraction was in the range of 50% to 55%. Wall motion was normal; there were no regional wall motion abnormalities. Doppler parameters are consistent with abnormal left ventricular relaxation (grade 1 diastolic dysfunction). - Aortic valve: Transvalvular velocity was within the normal range. There was no  stenosis. There was no regurgitation. - Mitral valve: There was no regurgitation. - Right ventricle: The cavity size was normal. Wall thickness was normal. Systolic function was normal. - Tricuspid valve: There was no regurgitation. - Inferior vena cava: The vessel was normal in size. The respirophasic diameter changes were in the normal range (>= 50%), consistent with normal central venous pressure.Study Conclusions  - Left ventricle: The cavity size was normal. There was mild concentric hypertrophy. Systolic function was normal. The estimated ejection fraction was in the range of 50% to 55%. Wall motion was normal; there were no regional wall motion abnormalities. Doppler parameters are consistent with abnormal left ventricular relaxation (grade 1 diastolic dysfunction). - Aortic valve: Transvalvular velocity was within the normal range. There was no stenosis. There was no regurgitation. - Mitral valve: There was no regurgitation. - Right ventricle: The cavity size was normal. Wall thickness was normal. Systolic  function was normal. - Tricuspid valve: There was no regurgitation. - Inferior vena cava: The vessel was normal in size. The respirophasic diameter changes were in the normal range (>= 50%), consistent with normal central venous pressure.    ASSESSMENT & PLAN:    61 yo man with   1) Stage IVB E Mantle Cell lymphoma with likely gastric involvement, extensive LNadenopathy. ECHO nl EF ECOG PS 1-2 but activities significantly limited due to being legally blind HIV/Hep C/Hep B neg Baseline LDH 450--->135-->163  PET/CT scan after 3 cycles of R CHOP show good overall response. Stomach lesion still with very FDG uptake ? Residual lymphoma versus inflammation.  Patient is status post 6 cycles of R CHOP. PET/CT after 6 cycles shows resolution of hypermetabolic lymphadenopathy and gastric wall activity . Patient appears to be in CR at this time. LDH  levels are normal . Patient has no clinical symptoms of disease progression and feels very well .  2) Retinitis pigmentosa causing legal blindness -  is seeing a retina specialist in town. No acute interventions at this time.   3) Weight loss and protein calorie malnutrition. Resolved. Weight has improved significantly since his diagnosis and back to his baseline weight. He is eating well. . Wt Readings from Last 3 Encounters:  11/06/16 209 lb (94.8 kg)  09/06/16 207 lb (93.9 kg)  06/18/16 199 lb 6.4 oz (90.4 kg)   4) Peri-orbital dermatitis /Conjuncitivitis - patient was seen by the dermatologist and given topical desonide ointment which led to the resolution of his dermatitis. There were not sure about the etiology but noted then to could not rule out Rituxan is a possibility. Currently his dermatitis has resolved and his conjunctivitis has resolved. He did not have rash anywhere else at the time. No oral sores or other toxicities. This happened about 4 days after Rituxan another chemotherapy. This has completely resolved and patient has not had an further issues with this with several additional doses of Rituxan.  PLAN -patient has no clinical evidence of lymphoma recurrence at this time. He feels well.  -We'll proceed with his 3rd cycle of maintenance Rituxan today. --He was counseled to take over-the-counter cetirizine 10mg  daily along with famotidine 40 mg daily -We will do standard protocol Rituxan and not the rapid protocol  -continue f/u with PCP for other continued medical issues.  Return to care with Dr. Irene Limbo in 8 weeks with labs and appointment for next cycle of maintenance Rituxan. Earlier if any new concerns.  I spent 20 minutes counseling the patient face to face. The total time spent in the appointment was 25 minutes and more than 50% was on counseling and direct patient cares. Patient was accompanied by his son and daughter for this visit .    Sullivan Lone MD [place] AAHIVMS  [facility] Hematology/Oncology Physician [facility]  (Office):       [phone] (Work cell):  [phone] (Fax):           [phone]

## 2016-11-14 ENCOUNTER — Telehealth: Payer: Self-pay | Admitting: Hematology

## 2016-11-14 NOTE — Telephone Encounter (Signed)
I called the patient and left a VM about the appointments on 3/27

## 2017-01-01 ENCOUNTER — Ambulatory Visit (HOSPITAL_BASED_OUTPATIENT_CLINIC_OR_DEPARTMENT_OTHER): Payer: Medicare Other | Admitting: Hematology

## 2017-01-01 ENCOUNTER — Other Ambulatory Visit (HOSPITAL_BASED_OUTPATIENT_CLINIC_OR_DEPARTMENT_OTHER): Payer: Medicare Other

## 2017-01-01 ENCOUNTER — Encounter: Payer: Self-pay | Admitting: Hematology

## 2017-01-01 ENCOUNTER — Ambulatory Visit (HOSPITAL_BASED_OUTPATIENT_CLINIC_OR_DEPARTMENT_OTHER): Payer: Medicare Other

## 2017-01-01 ENCOUNTER — Ambulatory Visit: Payer: Medicare Other

## 2017-01-01 VITALS — BP 114/66 | HR 81 | Temp 98.2°F | Resp 18

## 2017-01-01 VITALS — BP 126/68 | HR 76 | Temp 97.8°F | Resp 18 | Ht 69.0 in | Wt 209.9 lb

## 2017-01-01 DIAGNOSIS — Z5112 Encounter for antineoplastic immunotherapy: Secondary | ICD-10-CM

## 2017-01-01 DIAGNOSIS — C8318 Mantle cell lymphoma, lymph nodes of multiple sites: Secondary | ICD-10-CM

## 2017-01-01 DIAGNOSIS — H5352 Acquired color vision deficiency: Secondary | ICD-10-CM

## 2017-01-01 DIAGNOSIS — Z95828 Presence of other vascular implants and grafts: Secondary | ICD-10-CM

## 2017-01-01 LAB — COMPREHENSIVE METABOLIC PANEL
ALT: 16 U/L (ref 0–55)
AST: 10 U/L (ref 5–34)
Albumin: 3.6 g/dL (ref 3.5–5.0)
Alkaline Phosphatase: 98 U/L (ref 40–150)
Anion Gap: 9 mEq/L (ref 3–11)
BUN: 14.5 mg/dL (ref 7.0–26.0)
CO2: 26 meq/L (ref 22–29)
CREATININE: 1 mg/dL (ref 0.7–1.3)
Calcium: 9.4 mg/dL (ref 8.4–10.4)
Chloride: 105 mEq/L (ref 98–109)
EGFR: 89 mL/min/{1.73_m2} — ABNORMAL LOW (ref >90)
Glucose: 190 mg/dl — ABNORMAL HIGH (ref 70–140)
Potassium: 4.1 mEq/L (ref 3.5–5.1)
SODIUM: 140 meq/L (ref 136–145)
Total Bilirubin: 0.35 mg/dL (ref 0.20–1.20)
Total Protein: 6.4 g/dL (ref 6.4–8.3)

## 2017-01-01 LAB — CBC & DIFF AND RETIC
BASO%: 0.6 % (ref 0.0–2.0)
BASOS ABS: 0.1 10*3/uL (ref 0.0–0.1)
EOS%: 5.7 % (ref 0.0–7.0)
Eosinophils Absolute: 0.5 10*3/uL (ref 0.0–0.5)
HEMATOCRIT: 44.1 % (ref 38.4–49.9)
HGB: 13.8 g/dL (ref 13.0–17.1)
Immature Retic Fract: 8.2 % (ref 3.00–10.60)
LYMPH#: 1 10*3/uL (ref 0.9–3.3)
LYMPH%: 10.9 % — ABNORMAL LOW (ref 14.0–49.0)
MCH: 28.8 pg (ref 27.2–33.4)
MCHC: 31.3 g/dL — AB (ref 32.0–36.0)
MCV: 91.9 fL (ref 79.3–98.0)
MONO#: 0.9 10*3/uL (ref 0.1–0.9)
MONO%: 10.2 % (ref 0.0–14.0)
NEUT%: 72.6 % (ref 39.0–75.0)
NEUTROS ABS: 6.4 10*3/uL (ref 1.5–6.5)
Platelets: 193 10*3/uL (ref 140–400)
RBC: 4.8 10*6/uL (ref 4.20–5.82)
RDW: 13.6 % (ref 11.0–14.6)
RETIC %: 1.42 % (ref 0.80–1.80)
RETIC CT ABS: 68.16 10*3/uL (ref 34.80–93.90)
WBC: 8.8 10*3/uL (ref 4.0–10.3)

## 2017-01-01 LAB — LACTATE DEHYDROGENASE: LDH: 165 U/L (ref 125–245)

## 2017-01-01 MED ORDER — SODIUM CHLORIDE 0.9 % IV SOLN
375.0000 mg/m2 | Freq: Once | INTRAVENOUS | Status: AC
  Administered 2017-01-01: 800 mg via INTRAVENOUS
  Filled 2017-01-01: qty 30

## 2017-01-01 MED ORDER — SODIUM CHLORIDE 0.9 % IJ SOLN
10.0000 mL | INTRAMUSCULAR | Status: DC | PRN
  Administered 2017-01-01: 10 mL via INTRAVENOUS
  Filled 2017-01-01: qty 10

## 2017-01-01 MED ORDER — ACETAMINOPHEN 325 MG PO TABS
650.0000 mg | ORAL_TABLET | Freq: Once | ORAL | Status: AC
  Administered 2017-01-01: 650 mg via ORAL

## 2017-01-01 MED ORDER — SODIUM CHLORIDE 0.9% FLUSH
10.0000 mL | INTRAVENOUS | Status: DC | PRN
  Administered 2017-01-01: 10 mL
  Filled 2017-01-01: qty 10

## 2017-01-01 MED ORDER — SODIUM CHLORIDE 0.9 % IV SOLN: 10.0000 mg | Freq: Once | INTRAVENOUS | Status: DC

## 2017-01-01 MED ORDER — DEXAMETHASONE SODIUM PHOSPHATE 10 MG/ML IJ SOLN
10.0000 mg | Freq: Once | INTRAMUSCULAR | Status: AC
  Administered 2017-01-01: 10 mg via INTRAVENOUS

## 2017-01-01 MED ORDER — DIPHENHYDRAMINE HCL 25 MG PO CAPS
50.0000 mg | ORAL_CAPSULE | Freq: Once | ORAL | Status: AC
  Administered 2017-01-01: 50 mg via ORAL

## 2017-01-01 MED ORDER — HEPARIN SOD (PORK) LOCK FLUSH 100 UNIT/ML IV SOLN
500.0000 [IU] | Freq: Once | INTRAVENOUS | Status: AC | PRN
Start: 2017-01-01 — End: 2017-01-01
  Administered 2017-01-01: 500 [IU]
  Filled 2017-01-01: qty 5

## 2017-01-01 MED ORDER — ACETAMINOPHEN 325 MG PO TABS
ORAL_TABLET | ORAL | Status: AC
  Filled 2017-01-01: qty 2

## 2017-01-01 MED ORDER — FAMOTIDINE IN NACL 20-0.9 MG/50ML-% IV SOLN
INTRAVENOUS | Status: AC
  Filled 2017-01-01: qty 50

## 2017-01-01 MED ORDER — FAMOTIDINE IN NACL 20-0.9 MG/50ML-% IV SOLN
20.0000 mg | Freq: Two times a day (BID) | INTRAVENOUS | Status: DC
  Administered 2017-01-01: 20 mg via INTRAVENOUS

## 2017-01-01 MED ORDER — DIPHENHYDRAMINE HCL 25 MG PO CAPS
ORAL_CAPSULE | ORAL | Status: AC
  Filled 2017-01-01: qty 2

## 2017-01-01 MED ORDER — DEXAMETHASONE SODIUM PHOSPHATE 10 MG/ML IJ SOLN
INTRAMUSCULAR | Status: AC
  Filled 2017-01-01: qty 1

## 2017-01-01 MED ORDER — SODIUM CHLORIDE 0.9 % IV SOLN
Freq: Once | INTRAVENOUS | Status: AC
  Administered 2017-01-01: 11:00:00 via INTRAVENOUS

## 2017-01-01 NOTE — Progress Notes (Signed)
[name] Kitchen    HEMATOLOGY/ONCOLOGY CLINIC NOTE  Date of Service: .01/01/2017   Patient Care Team: No Pcp Per Patient as PCP - General (General Practice)  CHIEF COMPLAINTS/PURPOSE OF CONSULTATION:   followup for New Diagnosed Mantle Cell lymphoma  DIAGNOSIS Stage IV Mantle cell lymphoma diagnosed in 10/25/2015  Current TREATMENT   Planned Rituxan maintenance q 41months  PREVIOUS TREATMENT  Status post R CHOP 6 cycles . Patient chooses not to pursue AutoHSCT based strategies which is quite reasonable considering his functional challenges with legal blindness and limited social support.   HISTORY OF PRESENTING ILLNESS: plz see clinic note for details on initial presentation  Interval History  Kristopher Johnson is here for follow-up of his mantle cell lymphoma and his next cycle of maintenance Rituxan. He notes no concerning new symptoms. He has been eating well and notes that his weight has been stable. No new LNadenopathy. No fevers/chills/ nightsweats.No other concerning focal symptoms. His granddaughter and grandchild are living with him and helping take care of him. He has 3 dogs at home. He appears to be having some difficulties coming to grips with the limitations from his worsening vision from his retinitis pigmentosa.  MEDICAL HISTORY:  Past Medical History:  Diagnosis Date  . Abscess of arm, right 10/27/2015  . Lymphoma of lymph nodes of multiple sites ([place]) 10/27/2015  . 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 Shortness of breath 10/27/2015    SURGICAL HISTORY: Past Surgical History:  Procedure Laterality Date  . Cataract surgery     Patient notes she has had bilateral Surgery in 1998 and 99  . TONSILLECTOMY     about 61years old    SOCIAL HISTORY: Social History   Social History  . Marital status: Widowed    Spouse name: N/A  . Number of children: N/A  . Years of education: N/A    Occupational History  . Not on file.   Social History Main Topics  . Smoking status: Current Every Day Smoker    Packs/day: 1.00    Years: 33.00  . Smokeless tobacco: Never Used  . Alcohol use No  . Drug use: No  . Sexual activity: Not on file     Comment: Disabled, retinis pigmentosa legally blind, 4children MPOA Lynnn(cousin)   Other Topics Concern  . Not on file   Social History Narrative  . No narrative on file    FAMILY HISTORY: History reviewed. No pertinent family history.  ALLERGIES:  has No Known Allergies.  MEDICATIONS:  Current Outpatient Prescriptions  Medication Sig Dispense Refill  . lidocaine-prilocaine (EMLA) cream Apply 1 application topically as needed. (Patient not taking: Reported on 11/06/2016) 30 g 6  . ondansetron (ZOFRAN) 8 MG tablet Take 1 tablet (8 mg total) by mouth 2 (two) times daily as needed for refractory nausea / vomiting. (Patient not taking: Reported on 11/06/2016) 30 tablet 1  . prochlorperazine (COMPAZINE) 10 MG tablet Take 1 tablet (10 mg total) by mouth every 6 (six) hours as needed (Nausea or vomiting). (Patient not taking: Reported on 11/06/2016) 30 tablet 6   No current facility-administered medications for this visit.     REVIEW OF SYSTEMS:    10 Point review of Systems was done is negative except as noted above.  PHYSICAL EXAMINATION: ECOG PERFORMANCE STATUS: 2 - Symptomatic, <50% confined to bed .BP 126/68 (BP Location: Left Arm, Patient Position: Sitting)   Pulse  76   Temp 97.8 F (36.6 C) (Oral)   Resp 18   Ht 5\' 9"  (1.753 m)   Wt 209 lb 14.4 oz (95.2 kg)   SpO2 97%   BMI 31.00 kg/m   GENERAL:alert, in no acute distress and comfortable SKIN: some nodular skin lesions over extremities and back, beginning crusted lesions over his right scapular area. EYES: normal, conjunctiva are pink and non-injected, sclera clear. Poor visual acuity - some directional light perception but difficulty with finger  counting. OROPHARYNX:no exudate, no erythema and lips, multiple carious teeth with significant periodontal disease. NECK: supple, no JVD, thyroid normal size, non-tender, without nodularity LYMPH:  no palpable lymphadenopathy in the cervical, axillary or inguinal LUNGS: clear to auscultation with normal respiratory effort HEART: regular rate & rhythm,  no murmurs and no lower extremity edema ABDOMEN: abdomen soft, non-tender, normoactive bowel sounds  Musculoskeletal: no cyanosis of digits and no clubbing  PSYCH: alert & oriented x 3 with fluent speech NEURO: no focal motor/sensory deficits  LABORATORY DATA:  I have reviewed the data as listed   CBC Latest Ref Rng & Units 01/01/2017 11/06/2016 09/06/2016  WBC 4.0 - 10.3 10e3/uL 8.8 8.2 7.7  Hemoglobin 13.0 - 17.1 g/dL 13.8 14.9 14.5  Hematocrit 38.4 - 49.9 % 44.1 46.5 45.3  Platelets 140 - 400 10e3/uL 193 192 151    . CMP Latest Ref Rng & Units 01/01/2017 11/06/2016 09/06/2016  Glucose 70 - 140 mg/dl 190(H) 197(H) 149(H)  BUN 7.0 - 26.0 mg/dL 14.5 16.3 12.1  Creatinine 0.7 - 1.3 mg/dL 1.0 1.1 0.9  Sodium 136 - 145 mEq/L 140 140 141  Potassium 3.5 - 5.1 mEq/L 4.1 4.3 3.9  Chloride 101 - 111 mmol/L - - -  CO2 22 - 29 mEq/L 26 29 26   Calcium 8.4 - 10.4 mg/dL 9.4 9.5 9.3  Total Protein 6.4 - 8.3 g/dL 6.4 6.9 6.7  Total Bilirubin 0.20 - 1.20 mg/dL 0.35 0.42 0.44  Alkaline Phos 40 - 150 U/L 98 93 85  AST 5 - 34 U/L 10 13 15   ALT 0 - 55 U/L 16 22 26     Lab Results  Component Value Date   LDH 164 11/06/2016    RADIOGRAPHIC STUDIES: I have personally reviewed the radiological images as listed and agreed with the findings in the report. No results found.  ECHO: 11/01/2015: Study Conclusions  - Left ventricle: The cavity size was normal. There was mild concentric hypertrophy. Systolic function was normal. The estimated ejection fraction was in the range of 50% to 55%. Wall motion was normal; there were no regional wall  motion abnormalities. Doppler parameters are consistent with abnormal left ventricular relaxation (grade 1 diastolic dysfunction). - Aortic valve: Transvalvular velocity was within the normal range. There was no stenosis. There was no regurgitation. - Mitral valve: There was no regurgitation. - Right ventricle: The cavity size was normal. Wall thickness was normal. Systolic function was normal. - Tricuspid valve: There was no regurgitation. - Inferior vena cava: The vessel was normal in size. The respirophasic diameter changes were in the normal range (>= 50%), consistent with normal central venous pressure.Study Conclusions  - Left ventricle: The cavity size was normal. There was mild concentric hypertrophy. Systolic function was normal. The estimated ejection fraction was in the range of 50% to 55%. Wall motion was normal; there were no regional wall motion abnormalities. Doppler parameters are consistent with abnormal left ventricular relaxation (grade 1 diastolic dysfunction). - Aortic valve: Transvalvular velocity was within  the normal range. There was no stenosis. There was no regurgitation. - Mitral valve: There was no regurgitation. - Right ventricle: The cavity size was normal. Wall thickness was normal. Systolic function was normal. - Tricuspid valve: There was no regurgitation. - Inferior vena cava: The vessel was normal in size. The respirophasic diameter changes were in the normal range (>= 50%), consistent with normal central venous pressure.    ASSESSMENT & PLAN:    61 yo man with   1) Stage IVB E Mantle Cell lymphoma with likely gastric involvement, extensive LNadenopathy. ECHO nl EF ECOG PS 1-2 but activities significantly limited due to being legally blind HIV/Hep C/Hep B neg Baseline LDH 450--->135-->163  PET/CT scan after 3 cycles of R CHOP show good overall response. Stomach lesion still with very FDG uptake ? Residual  lymphoma versus inflammation.  Patient is status post 6 cycles of R CHOP. PET/CT after 6 cycles shows resolution of hypermetabolic lymphadenopathy and gastric wall activity . Patient appears to be in CR at this time. LDH levels are normal . Patient has no lab evidence or clinical symptoms of disease progression and feels well .  2) Retinitis pigmentosa causing legal blindness -  is seeing a retina specialist in town. No acute interventions at this time. Notes that his vision is progressively getting worse. His daughter and granddaughter are helping him out at home. He was encouraged to use some of the resources for the blind or through the retinitis pigmentosa [employer] to try   3) Weight loss and protein calorie malnutrition. Resolved. Weight has improved significantly since his diagnosis and back to his baseline weight. No significant wt change since last visit He is eating well. . Wt Readings from Last 3 Encounters:  01/01/17 209 lb 14.4 oz (95.2 kg)  11/06/16 209 lb (94.8 kg)  09/06/16 207 lb (93.9 kg)   4) Peri-orbital dermatitis /Conjuncitivitis - patient was seen by the dermatologist and given topical desonide ointment which led to the resolution of his dermatitis. There were not sure about the etiology but noted then to could not rule out Rituxan is a possibility. Currently his dermatitis has resolved and his conjunctivitis has resolved. He did not have rash anywhere else at the time. No oral sores or other toxicities. This happened about 4 days after Rituxan another chemotherapy. This has completely resolved and patient has not had an further issues with this with several additional doses of Rituxan.  PLAN -patient has no clinical evidence of lymphoma recurrence at this time. He feels well.  -We'll proceed with his 4th cycle of maintenance Rituxan today. -f/u LDH level drawn today- result pending -We will do standard protocol Rituxan and not the rapid protocol   -continue f/u with  PCP for other continued medical issues.  Return to care with Dr. Irene Limbo in 8 weeks with labs and appointment for next cycle of maintenance Rituxan. Earlier if any new concerns.  I spent 20 minutes counseling the patient face to face. The total time spent in the appointment was 20 minutes and more than 50% was on counseling and direct patient cares. Patient was accompanied by his son and daughter for this visit .    Sullivan Lone MD [place] [facility] Hematology/Oncology Physician [facility]  (Office):       [phone] (Work cell):  [phone] (Fax):           [phone]

## 2017-01-01 NOTE — Patient Instructions (Signed)
[facility] Discharge Instructions for Patients Receiving Chemotherapy  Today you received the following chemotherapy agents: Rituxan   To help prevent nausea and vomiting after your treatment, we encourage you to take your nausea medication as directed.    If you develop nausea and vomiting that is not controlled by your nausea medication, call the clinic.   BELOW ARE SYMPTOMS THAT SHOULD BE REPORTED IMMEDIATELY:  *FEVER GREATER THAN 100.5 F  *CHILLS WITH OR WITHOUT FEVER  NAUSEA AND VOMITING THAT IS NOT CONTROLLED WITH YOUR NAUSEA MEDICATION  *UNUSUAL SHORTNESS OF BREATH  *UNUSUAL BRUISING OR BLEEDING  TENDERNESS IN MOUTH AND THROAT WITH OR WITHOUT PRESENCE OF ULCERS  *URINARY PROBLEMS  *BOWEL PROBLEMS  UNUSUAL RASH Items with * indicate a potential emergency and should be followed up as soon as possible.  Feel free to call the clinic you have any questions or concerns. The clinic phone number is [phone].  Please show the CHEMO ALERT CARD at check-in to the Emergency Department and triage nurse.   

## 2017-01-01 NOTE — Patient Instructions (Signed)
Implanted Port Home Guide An implanted port is a type of central line that is placed under the skin. Central lines are used to provide IV access when treatment or nutrition needs to be given through a person's veins. Implanted ports are used for long-term IV access. An implanted port may be placed because:  You need IV medicine that would be irritating to the small veins in your hands or arms.  You need long-term IV medicines, such as antibiotics.  You need IV nutrition for a long period.  You need frequent blood draws for lab tests.  You need dialysis.  Implanted ports are usually placed in the chest area, but they can also be placed in the upper arm, the abdomen, or the leg. An implanted port has two main parts:  Reservoir. The reservoir is round and will appear as a small, raised area under your skin. The reservoir is the part where a needle is inserted to give medicines or draw blood.  Catheter. The catheter is a thin, flexible tube that extends from the reservoir. The catheter is placed into a large vein. Medicine that is inserted into the reservoir goes into the catheter and then into the vein.  How will I care for my incision site? Do not get the incision site wet. Bathe or shower as directed by your health care provider. How is my port accessed? Special steps must be taken to access the port:  Before the port is accessed, a numbing cream can be placed on the skin. This helps numb the skin over the port site.  Your health care provider uses a sterile technique to access the port. ? Your health care provider must put on a mask and sterile gloves. ? The skin over your port is cleaned carefully with an antiseptic and allowed to dry. ? The port is gently pinched between sterile gloves, and a needle is inserted into the port.  Only "non-coring" port needles should be used to access the port. Once the port is accessed, a blood return should be checked. This helps ensure that the port  is in the vein and is not clogged.  If your port needs to remain accessed for a constant infusion, a clear (transparent) bandage will be placed over the needle site. The bandage and needle will need to be changed every week, or as directed by your health care provider.  Keep the bandage covering the needle clean and dry. Do not get it wet. Follow your health care provider's instructions on how to take a shower or bath while the port is accessed.  If your port does not need to stay accessed, no bandage is needed over the port.  What is flushing? Flushing helps keep the port from getting clogged. Follow your health care provider's instructions on how and when to flush the port. Ports are usually flushed with saline solution or a medicine called heparin. The need for flushing will depend on how the port is used.  If the port is used for intermittent medicines or blood draws, the port will need to be flushed: ? After medicines have been given. ? After blood has been drawn. ? As part of routine maintenance.  If a constant infusion is running, the port may not need to be flushed.  How long will my port stay implanted? The port can stay in for as long as your health care provider thinks it is needed. When it is time for the port to come out, surgery will be   done to remove it. The procedure is similar to the one performed when the port was put in. When should I seek immediate medical care? When you have an implanted port, you should seek immediate medical care if:  You notice a bad smell coming from the incision site.  You have swelling, redness, or drainage at the incision site.  You have more swelling or pain at the port site or the surrounding area.  You have a fever that is not controlled with medicine.  This information is not intended to replace advice given to you by your health care provider. Make sure you discuss any questions you have with your health care provider. Document  Released: 09/24/2005 Document Revised: 03/01/2016 Document Reviewed: 06/01/2013 Elsevier Interactive Patient Education  2017 [employer] Inc.  

## 2017-01-04 ENCOUNTER — Telehealth: Payer: Self-pay | Admitting: Hematology

## 2017-01-04 IMAGING — CT CT ABD-PELV W/ CM
2 of 5 series · 15 of 46 positions shown, 17 images · IV contrast (omnipaque)
Comparison: None.

CLINICAL DATA: Upper abdominal pain and bloating for 2 months.

EXAM:
CT ABDOMEN AND PELVIS WITH CONTRAST
TECHNIQUE: Multidetector CT imaging of the abdomen and pelvis was performed
using the standard protocol following bolus administration of
intravenous contrast.
CONTRAST:  100mL OMNIPAQUE IOHEXOL 300 MG/ML  SOLN

[Series 2: abd/ pelvis 5.0 i30f 1 · axial · 0.85mm/px · z∈[+3,+388]mm · 12 of 87 slices shown, 14 images]
[im 5/87  soft-tissue]
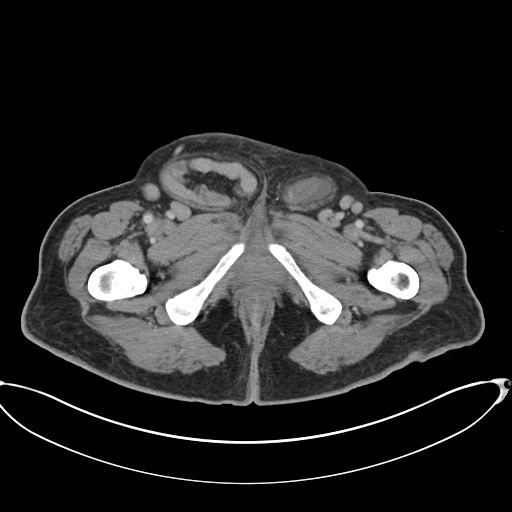
[im 5/87  bone]
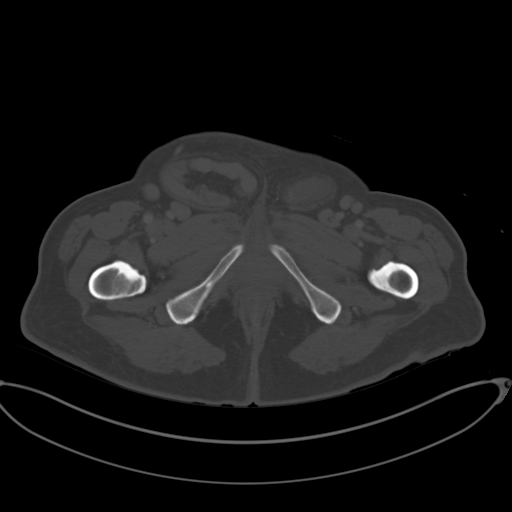
[im 14/87  soft-tissue]
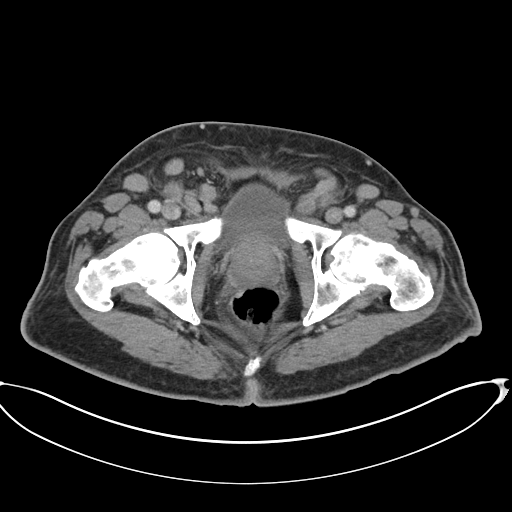
[im 19/87  soft-tissue]
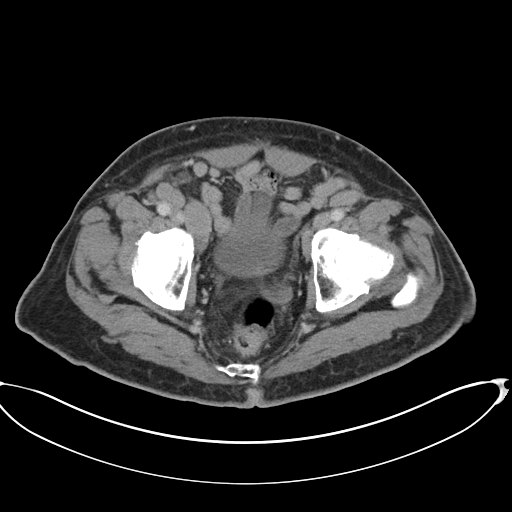
[im 28/87  soft-tissue]
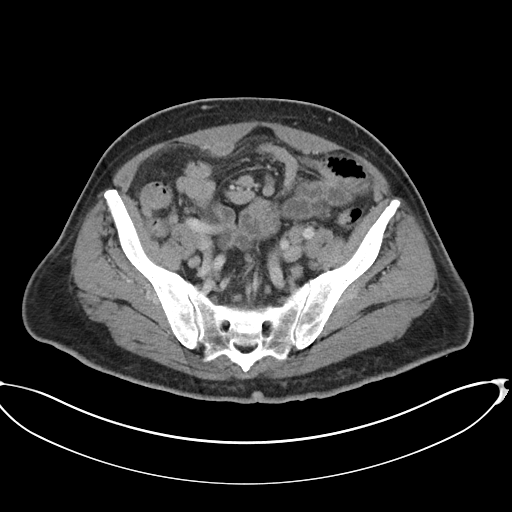
[im 32/87  soft-tissue]
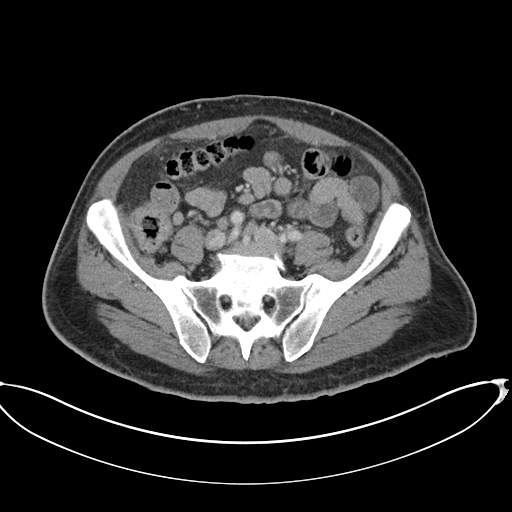
[im 41/87  soft-tissue]
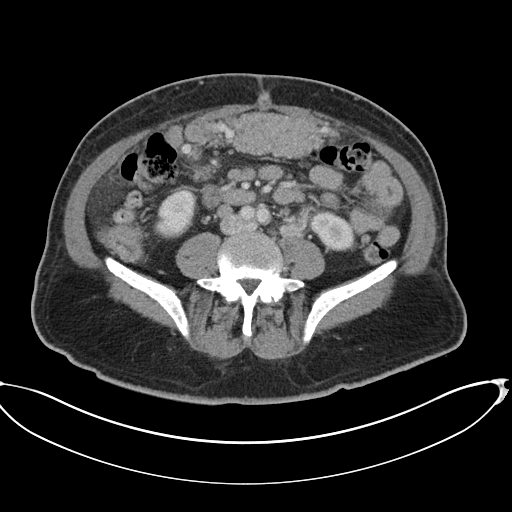
[im 46/87  soft-tissue]
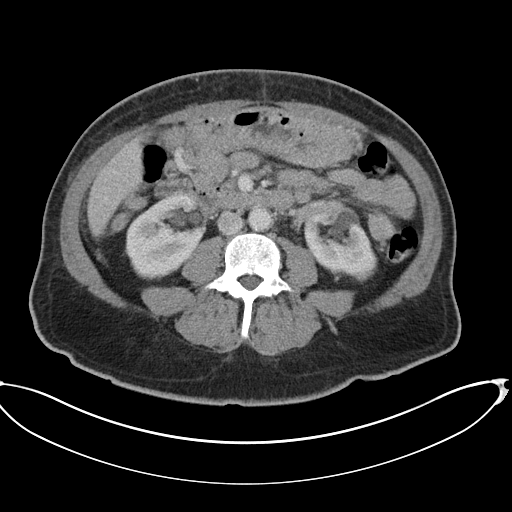
[im 55/87  soft-tissue]
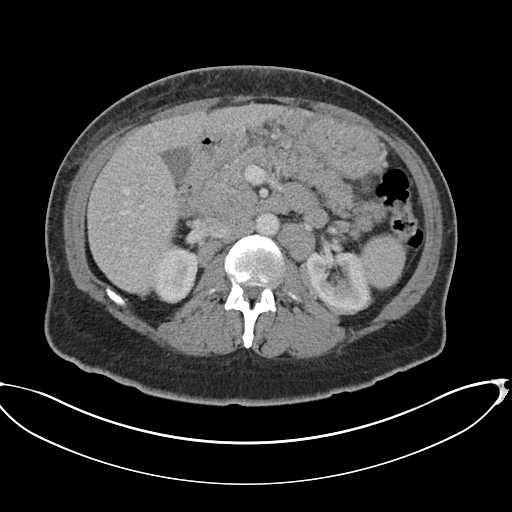
[im 59/87  soft-tissue]
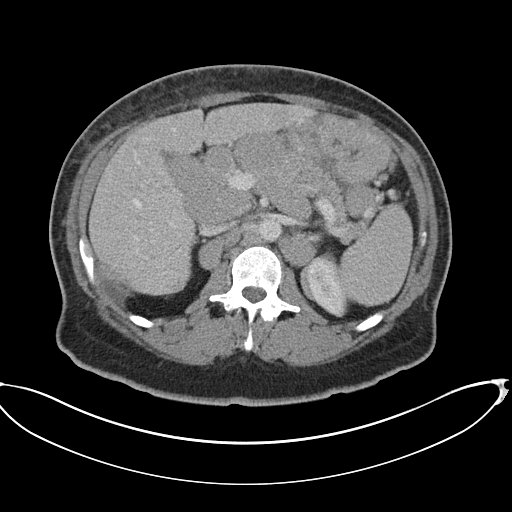
[im 59/87  bone]
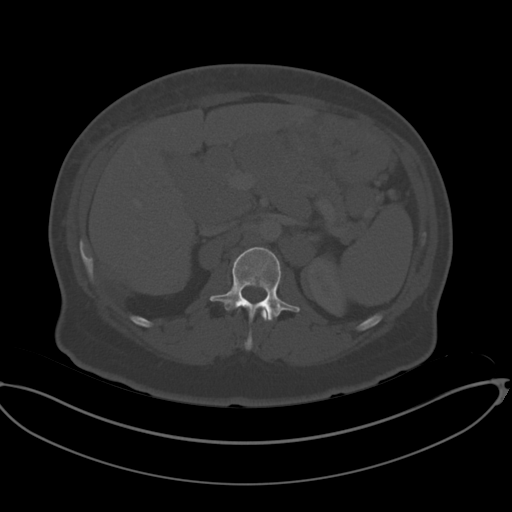
[im 68/87  soft-tissue]
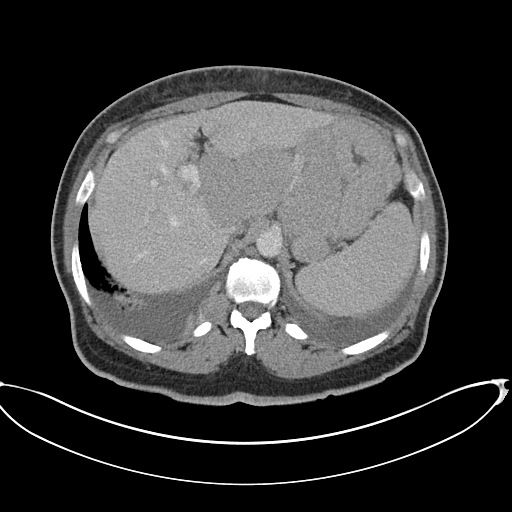
[im 73/87  soft-tissue]
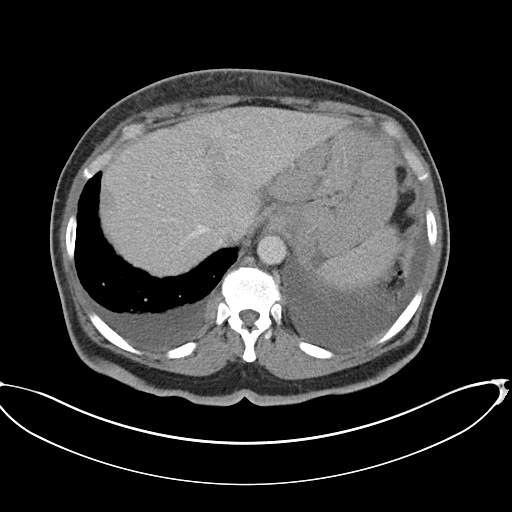
[im 82/87  soft-tissue]
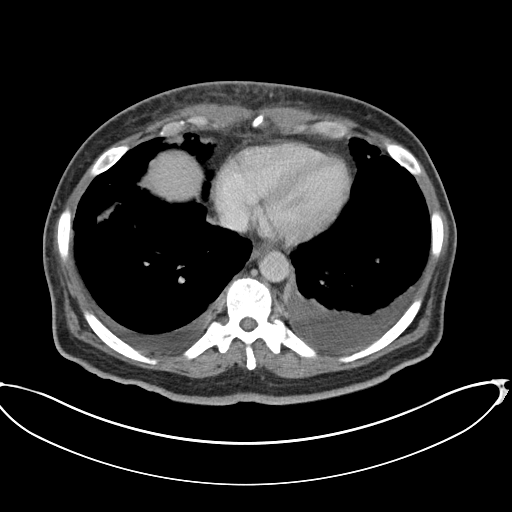

[Series 5: coronal soft tissue · coronal · 0.70mm/px · 3 of 87 slices shown]
[im 29/87  soft-tissue]
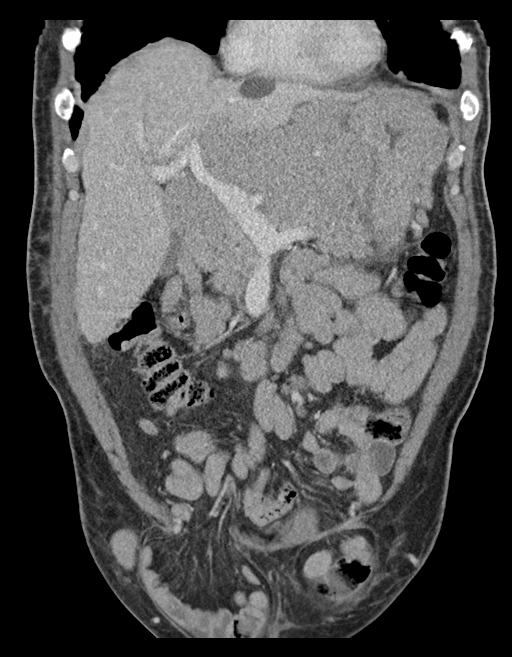
[im 39/87  soft-tissue]
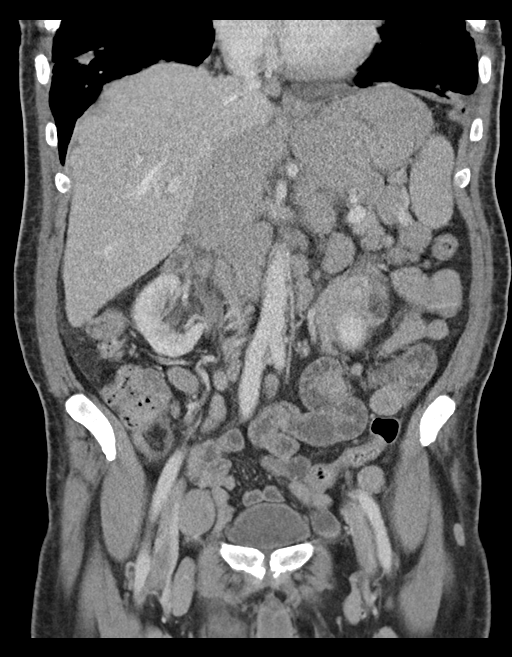
[im 48/87  soft-tissue]
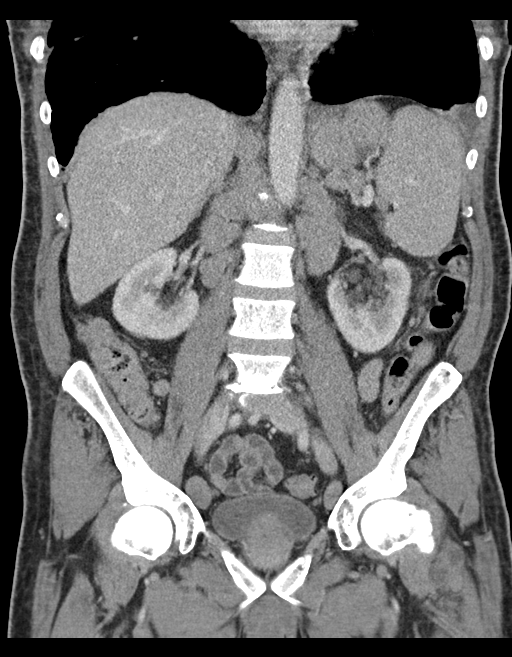

[15 of 46 positions shown; findings below may reference images not displayed]

FINDINGS: Lower chest: Nodularity along the bronchovascular bundles in both
lower lobes could be lymphoma in his involvement of the lungs. There
are bilateral pleural effusions and overlying atelectasis. There is
also a 17 mm epicardial lymph node on image 9. No pericardial
effusion. Small pleural nodules are also noted. Retrocrural
adenopathy is also noted.

Hepatobiliary: Simple appearing hepatic cyst in the left lobe. No
worrisome hepatic lesions or intrahepatic biliary dilatation. The
gallbladder is unremarkable. No common bile duct dilatation.

Pancreas: No mass, inflammation or ductal dilatation.

Spleen: Normal size.  No focal lesions.

Adrenals/Urinary Tract: The adrenal glands are grossly normal. There
is a thick rim soft tissue thickening along the posterior aspect of
the left kidney which could be lymphomatous involvement. A left
renal calculus is also noted. There is also a a thick rim of soft
tissue density along the left renal pelvis extending down along the
inferior margin of the kidney which is likely lymphoma. This
measures a maximum of 3.5 cm on image number 41.

Stomach/Bowel: The stomach is markedly thickened and somewhat
nodular and irregular. Findings suspicious for gastric lymphoma. The
duodenum, small bowel and colon are grossly normal. No obvious
inflammatory changes or mass lesions. No obstructive findings. The
terminal ileum is normal. The appendix is normal.

Vascular/Lymphatic: Bulky mesenteric and retroperitoneal
lymphadenopathy. Large upper abdominal mesenteric mass measures 12 x
7.5 cm on image number 22. There is also bulky retroperitoneal
adenopathy with an index node between the aorta and left kidney
measuring 30 x 24 mm on image number 32. There is also bilateral
pelvic and inguinal lymphadenopathy. An index right external iliac
lymph node on image number 69 measures a maximum of 21 mm short
axis. Right inguinal lymph node measures 27 x 16 mm on image number
87.

The aorta is normal in caliber. The branch vessels are patent. The
major venous structures are patent. Mild compression of the IVC by
the bulky adenopathy.

Other: The bladder is normal. The prostate gland is mildly enlarged
with median lobe hypertrophy impressing on the base of the bladder.
The seminal vesicles are normal. No free pelvic fluid collections.

Bilateral inguinal hernias containing small bowel loops.

Musculoskeletal: No obvious changes of osseous lymphoma.
IMPRESSION: 1. Constellation of findings most consistent with lymphoma. There is
bulky upper abdominal mesenteric lymphadenopathy, retroperitoneal
lymphadenopathy, pelvic adenopathy, inguinal adenopathy, probable
gastric lymphoma and lymphoma involving the left kidney.
2. Suspect lymphomatous involvement of the lungs. There also
bilateral pleural effusions and overlying atelectasis. No obvious
involvement of the liver or spleen.
Bilateral inguinal hernias containing small bowel loops.

## 2017-01-04 NOTE — Telephone Encounter (Signed)
Unable to reach patient. Scheduled appt per 01/01/2017 los. Sent appt letter out.

## 2017-02-26 ENCOUNTER — Telehealth: Payer: Self-pay | Admitting: Hematology

## 2017-02-26 NOTE — Telephone Encounter (Signed)
Left a vm for patient about the appointment change.  Dr appointment ONLY on 5/29 has been moved to 5/31. Due to Dr Irene Limbo being out of the office. Will still keep the lab and infusion on 5/29

## 2017-02-27 ENCOUNTER — Telehealth: Payer: Self-pay | Admitting: Hematology

## 2017-02-27 NOTE — Telephone Encounter (Signed)
[place] [facility] - moved 5/29 appointments to 5/31. Per desk nurse patient cannot be treated without seeing [place] - all appointments lab/fu/GK are to be moved to 5/31. Spoke with patient he is aware.

## 2017-02-28 ENCOUNTER — Encounter (HOSPITAL_COMMUNITY): Payer: Self-pay | Admitting: *Deleted

## 2017-02-28 ENCOUNTER — Emergency Department (HOSPITAL_COMMUNITY): Payer: Medicare Other

## 2017-02-28 ENCOUNTER — Inpatient Hospital Stay (HOSPITAL_COMMUNITY)
Admission: EM | Admit: 2017-02-28 | Discharge: 2017-03-02 | DRG: 375 | Disposition: A | Discharge status: Home / Self Care | Payer: Medicare Other | Attending: Internal Medicine | Admitting: Internal Medicine

## 2017-02-28 DIAGNOSIS — H548 Legal blindness, as defined in USA: Secondary | ICD-10-CM | POA: Diagnosis not present

## 2017-02-28 DIAGNOSIS — Z79899 Other long term (current) drug therapy: Secondary | ICD-10-CM

## 2017-02-28 DIAGNOSIS — K6389 Other specified diseases of intestine: Secondary | ICD-10-CM

## 2017-02-28 DIAGNOSIS — C189 Malignant neoplasm of colon, unspecified: Principal | ICD-10-CM

## 2017-02-28 DIAGNOSIS — C787 Secondary malignant neoplasm of liver and intrahepatic bile duct: Secondary | ICD-10-CM | POA: Diagnosis present

## 2017-02-28 DIAGNOSIS — K4090 Unilateral inguinal hernia, without obstruction or gangrene, not specified as recurrent: Secondary | ICD-10-CM | POA: Diagnosis not present

## 2017-02-28 DIAGNOSIS — K6289 Other specified diseases of anus and rectum: Secondary | ICD-10-CM

## 2017-02-28 DIAGNOSIS — H3552 Pigmentary retinal dystrophy: Secondary | ICD-10-CM | POA: Diagnosis present

## 2017-02-28 DIAGNOSIS — C8310 Mantle cell lymphoma, unspecified site: Secondary | ICD-10-CM | POA: Diagnosis present

## 2017-02-28 DIAGNOSIS — Z7982 Long term (current) use of aspirin: Secondary | ICD-10-CM

## 2017-02-28 DIAGNOSIS — Z87891 Personal history of nicotine dependence: Secondary | ICD-10-CM | POA: Diagnosis not present

## 2017-02-28 DIAGNOSIS — K625 Hemorrhage of anus and rectum: Secondary | ICD-10-CM | POA: Diagnosis present

## 2017-02-28 DIAGNOSIS — C8318 Mantle cell lymphoma, lymph nodes of multiple sites: Secondary | ICD-10-CM | POA: Diagnosis present

## 2017-02-28 LAB — COMPREHENSIVE METABOLIC PANEL
ALBUMIN: 3.7 g/dL (ref 3.5–5.0)
ALT: 15 U/L — AB (ref 17–63)
AST: 13 U/L — AB (ref 15–41)
Alkaline Phosphatase: 77 U/L (ref 38–126)
Anion gap: 8 (ref 5–15)
BILIRUBIN TOTAL: 0.4 mg/dL (ref 0.3–1.2)
BUN: 11 mg/dL (ref 6–20)
CHLORIDE: 105 mmol/L (ref 101–111)
CO2: 27 mmol/L (ref 22–32)
Calcium: 8.9 mg/dL (ref 8.9–10.3)
Creatinine, Ser: 0.85 mg/dL (ref 0.61–1.24)
GFR calc Af Amer: >60 mL/min (ref >60)
GFR calc non Af Amer: >60 mL/min (ref >60)
GLUCOSE: 119 mg/dL — AB (ref 65–99)
POTASSIUM: 3.5 mmol/L (ref 3.5–5.1)
Sodium: 140 mmol/L (ref 135–145)
TOTAL PROTEIN: 6.8 g/dL (ref 6.5–8.1)

## 2017-02-28 LAB — CBC WITH DIFFERENTIAL/PLATELET
Basophils Absolute: 0.1 10*3/uL (ref 0.0–0.1)
Basophils Relative: 0 %
EOS PCT: 4 %
Eosinophils Absolute: 0.6 10*3/uL (ref 0.0–0.7)
HEMATOCRIT: 42.6 % (ref 39.0–52.0)
Hemoglobin: 13.2 g/dL (ref 13.0–17.0)
LYMPHS ABS: 1.5 10*3/uL (ref 0.7–4.0)
LYMPHS PCT: 11 %
MCH: 27.8 pg (ref 26.0–34.0)
MCHC: 31 g/dL (ref 30.0–36.0)
MCV: 89.9 fL (ref 78.0–100.0)
MONO ABS: 0.3 10*3/uL (ref 0.1–1.0)
Monocytes Relative: 2 %
Neutro Abs: 11.4 10*3/uL — ABNORMAL HIGH (ref 1.7–7.7)
Neutrophils Relative %: 83 %
Platelets: 238 10*3/uL (ref 150–400)
RBC: 4.74 MIL/uL (ref 4.22–5.81)
RDW: 14.1 % (ref 11.5–15.5)
WBC: 13.8 10*3/uL — ABNORMAL HIGH (ref 4.0–10.5)

## 2017-02-28 LAB — ABO/RH: ABO/RH(D): O POS

## 2017-02-28 LAB — POC OCCULT BLOOD, ED: Fecal Occult Bld: POSITIVE — AB

## 2017-02-28 LAB — TYPE AND SCREEN
ABO/RH(D): O POS
Antibody Screen: NEGATIVE

## 2017-02-28 MED ORDER — IOPAMIDOL (ISOVUE-300) INJECTION 61%
100.0000 mL | Freq: Once | INTRAVENOUS | Status: AC | PRN
  Administered 2017-02-28: 100 mL via INTRAVENOUS

## 2017-02-28 MED ORDER — IOPAMIDOL (ISOVUE-300) INJECTION 61%
INTRAVENOUS | Status: AC
  Filled 2017-02-28: qty 100

## 2017-02-28 NOTE — ED Triage Notes (Signed)
For the past 7 days, family has noticed that pt has bright red blood in the bowel movements.  Pt is legally blind so he did not notice it.  They have noticed blood on pt's toilet paper.  Pt reports bumps around his anal area.  Pt reports he is having difficulty having a BM.  He is straining. Pt a/o x 4 and ambulatory.

## 2017-02-28 NOTE — ED Provider Notes (Signed)
[facility] DEPT Provider Note   CSN: [id number] Arrival date & time: 02/28/17  1914     History   Chief Complaint No chief complaint on file.   HPI Kristopher Johnson is a 61 y.o. male who presents with hematochezia. Past medical history significant for lymphoma currently on chemotherapy. Patient is legally blind. He states that he's had constipation and decreased appetite for several months. He has not tried any stool softeners. Today he was told that his family members saw bright red blood on toilet paper after he had a bowel movement. This been going on for about a week every just was told by his daughter today. He has been having to strain. He has never had a colonoscopy. No fever, lightheadedness, chest pain, shortness of breath, abdominal pain, nausea, vomiting, rectal pain. He also noticed several hard bumps in the area couple days ago.  HPI  Past Medical History:  Diagnosis Date  . Abscess of arm, right 10/27/2015  . Lymphoma of lymph nodes of multiple sites ([place]) 10/27/2015  . 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 Shortness of breath 10/27/2015    Patient Active Problem List   Diagnosis Date Noted  . Portacath in place 01/30/2016  . Skin lesion of chest wall 12/19/2015  . Shingles 12/19/2015  . Rash 11/28/2015  . 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 Protein-calorie malnutrition ([place]) 11/01/2015  . Abscess of arm, right 10/27/2015  . Lymphoma of lymph nodes of multiple sites ([place]) 10/27/2015  . Shortness of breath 10/27/2015  . Shortness of breath dyspnea 10/27/2015  . Hypoalbuminemia 10/25/2015  . Elevated lipase 10/25/2015  . Inguinal hernia 10/25/2015  . Urinary retention 10/25/2015  . Abdominal pain   . Lymphadenopathy 10/24/2015    Past Surgical History:  Procedure Laterality Date  . Cataract surgery      Patient notes she has had bilateral Surgery in 1998 and 99  . TONSILLECTOMY     about 61years old       Home Medications    Prior to Admission medications   Medication Sig Start Date End Date Taking? Authorizing Provider  aspirin EC 325 MG tablet Take 325 mg by mouth every 6 (six) hours as needed (swelling).   Yes [provider]  lidocaine-prilocaine (EMLA) cream Apply 1 application topically as needed. Patient not taking: Reported on 11/06/2016 11/15/15   Brunetta Genera, MD  ondansetron (ZOFRAN) 8 MG tablet Take 1 tablet (8 mg total) by mouth 2 (two) times daily as needed for refractory nausea / vomiting. Patient not taking: Reported on 11/06/2016 11/07/15   Brunetta Genera, MD  prochlorperazine (COMPAZINE) 10 MG tablet Take 1 tablet (10 mg total) by mouth every 6 (six) hours as needed (Nausea or vomiting). Patient not taking: Reported on 11/06/2016 11/07/15   Brunetta Genera, MD    Family History No family history on file.  Social History Social History  Substance Use Topics  . Smoking status: Current Every Day Smoker    Packs/day: 1.00    Years: 33.00  . Smokeless tobacco: Never Used  . Alcohol use No     Allergies   Patient has no known allergies.   Review of Systems Review of Systems  Constitutional: Negative for chills and fever.  Respiratory: Negative for shortness of breath.   Cardiovascular: Negative for chest  pain.  Gastrointestinal: Positive for blood in stool and constipation. Negative for abdominal pain, anal bleeding, diarrhea, nausea, rectal pain and vomiting.  Allergic/Immunologic: Positive for immunocompromised state (on chemo).  Neurological: Negative for syncope and light-headedness.  All other systems reviewed and are negative.    Physical Exam Updated Vital Signs BP 130/85   Pulse 92   Temp 99.1 F (37.3 C) (Oral)   Resp 20   Wt 93.5 kg (206 lb 3.2 oz)   SpO2 96%   BMI 30.45 kg/m   Physical Exam    Constitutional: He is oriented to person, place, and time. He appears well-developed and well-nourished. No distress.  HENT:  Head: Normocephalic and atraumatic.  Eyes: Conjunctivae are normal. Pupils are equal, round, and reactive to light. Right eye exhibits no discharge. Left eye exhibits no discharge. No scleral icterus.  Neck: Normal range of motion.  Cardiovascular: Normal rate and regular rhythm.  Exam reveals no gallop and no friction rub.   No murmur heard. Pulmonary/Chest: Effort normal and breath sounds normal. No respiratory distress. He has no wheezes. He has no rales. He exhibits no tenderness.  Abdominal: Soft. Bowel sounds are normal. He exhibits no distension and no mass. There is no tenderness. There is no rebound and no guarding. A hernia (Right sided inguinal hernia) is present.  Genitourinary:  Genitourinary Comments: Rectal: There is a hard nodule in the perineum which is non-tender. No inguinal lymph nodes are palpable. No gross blood, hemorrhoids, fissures, redness, area of fluctuance, lesions, or tenderness. Chaperone present during exam.   Neurological: He is alert and oriented to person, place, and time.  Skin: Skin is warm and dry.  Psychiatric: He has a normal mood and affect. His behavior is normal.  Nursing note and vitals reviewed.    ED Treatments / Results  Labs (all labs ordered are listed, but only abnormal results are displayed) Labs Reviewed  COMPREHENSIVE METABOLIC PANEL - Abnormal; Notable for the following:       Result Value   Glucose, Bld 119 (*)    AST 13 (*)    ALT 15 (*)    All other components within normal limits  CBC WITH DIFFERENTIAL/PLATELET - Abnormal; Notable for the following:    WBC 13.8 (*)    Neutro Abs 11.4 (*)    All other components within normal limits  CBC - Abnormal; Notable for the following:    WBC 14.2 (*)    Hemoglobin 12.7 (*)    All other components within normal limits  CBC - Abnormal; Notable for the  following:    WBC 14.1 (*)    Hemoglobin 12.8 (*)    All other components within normal limits  CBC - Abnormal; Notable for the following:    WBC 13.9 (*)    All other components within normal limits  BASIC METABOLIC PANEL - Abnormal; Notable for the following:    Potassium 3.4 (*)    Glucose, Bld 162 (*)    Calcium 8.7 (*)    All other components within normal limits  GLUCOSE, CAPILLARY - Abnormal; Notable for the following:    Glucose-Capillary 175 (*)    All other components within normal limits  GLUCOSE, CAPILLARY - Abnormal; Notable for the following:    Glucose-Capillary 221 (*)    All other components within normal limits  GLUCOSE, CAPILLARY - Abnormal; Notable for the following:    Glucose-Capillary 131 (*)    All other components within normal limits  POC OCCULT BLOOD,  ED - Abnormal; Notable for the following:    Fecal Occult Bld POSITIVE (*)    All other components within normal limits  HIV ANTIBODY (ROUTINE TESTING)  TYPE AND SCREEN  ABO/RH  SURGICAL PATHOLOGY    EKG  EKG Interpretation None       Radiology Ct Abdomen Pelvis W Contrast  Result Date: 02/28/2017 CLINICAL DATA:  Blood in bowel movements EXAM: CT ABDOMEN AND PELVIS WITH CONTRAST TECHNIQUE: Multidetector CT imaging of the abdomen and pelvis was performed using the standard protocol following bolus administration of intravenous contrast. CONTRAST:  188mL ISOVUE-300 IOPAMIDOL (ISOVUE-300) INJECTION 61% COMPARISON:  10/24/2015, PET CT 6 9,017 FINDINGS: Lower chest: Lung bases demonstrate no pleural effusion. Interim development of multiple pulmonary nodules, for example right posterior lower lobe pulmonary nodule measuring 9 mm, series 4, image number 53. Right lateral subpleural pulmonary nodule measuring 13 mm, series 4, image number 52. Multiple pleural-based masses along the lingula and left lower lobe. Normal heart size. Hepatobiliary: Stable left hepatic lobe cyst. No calcified gallstones or biliary  dilatation. Multiple nodules studding the diaphragmatic surface, for example series 2, image number 21 Pancreas: Unremarkable. No pancreatic ductal dilatation or surrounding inflammatory changes. Spleen: Normal in size without focal abnormality. Adrenals/Urinary Tract: Diffuse nodularity of the left adrenal gland which is unchanged. Right adrenal gland normal. No hydronephrosis. Small hypodensities at the left renal pelvis do not appear changed compared to prior PET-CT and may reflect small parapelvic cysts. Bladder normal. Stomach/Bowel: The stomach is nonenlarged. There is no evidence for a bowel obstruction. Interim finding of large intraluminal mass within the rectosigmoid colon, measuring approximately 9.1 x 4.4 cm. Appendix is normal. Vascular/Lymphatic: Non aneurysmal aorta. Diffuse adenopathy has developed. 2.8 x 1.9 cm mass with adjacent nodules in the right posterior cardiophrenic space. Mesenteric nodule, anterior to the duodenum measuring 15 mm, series 2, image number 44. Soft tissue mass posterior to the distal descending thoracic aorta measuring 2.7 cm. Multiple pelvic nodules. A left pelvic sidewall lymph node measures 1.6 cm, series 2, image number 69. 2.7 cm perirectal mass, series 2, image number 63. Reproductive: Prostate is unremarkable. Other: Bilateral bowel containing inguinal hernias. Development of multiple soft tissue nodules within the subcutaneous fat of the abdominopelvic wall, for example right parasagittal anterior nodule measuring 9 mm, series 2, image number 31, right lower 9 mm soft tissue nodule, series 2, image number 38, and left posterior gluteal region nodule measuring 13 mm, series 2, image number 58. Musculoskeletal: No acute or suspicious bone lesion is visualized. IMPRESSION: 1. Large intraluminal mass within the rectosigmoid colon, new since prior CT. Further evaluation with colonoscopy is recommended. Development of multiple pulmonary pulmonary nodules and pleural based  masses suspicious for metastatic disease. Development of diaphragmatic, cardio phrenic, retroperitoneal, mesenteric, and pelvic masses also suspicious for metastatic disease. Development of multiple subcutaneous soft tissue nodules within the abdomen and pelvis. Uncertain if the constellation of findings represents recurrence of patient's lymphoma or new primary in the colon with metastatic disease. 2. Large bowel containing inguinal hernias with no evidence for obstruction. Electronically Signed   By: Donavan Foil M.D.   On: 02/28/2017 22:50    Procedures Procedures (including critical care time)  Medications Ordered in ED Medications  iopamidol (ISOVUE-300) 61 % injection (not administered)  acetaminophen (TYLENOL) tablet 650 mg ( Oral MAR Unhold 03/01/17 1344)    Or  acetaminophen (TYLENOL) suppository 650 mg ( Rectal MAR Unhold 03/01/17 1344)  ondansetron (ZOFRAN) tablet 4 mg ( Oral MAR Unhold  03/01/17 1344)    Or  ondansetron (ZOFRAN) injection 4 mg ( Intravenous MAR Unhold 03/01/17 1344)  dextrose 5 %-0.9 % sodium chloride infusion ( Intravenous New Bag/Given 03/01/17 1549)  sodium chloride flush (NS) 0.9 % injection 10-40 mL (10 mLs Intracatheter Given by Other 03/01/17 2148)  sodium chloride flush (NS) 0.9 % injection 10-40 mL (10 mLs Intracatheter Given 03/02/17 0940)  polyethylene glycol (MIRALAX / GLYCOLAX) packet 17 g (17 g Oral Not Given 03/02/17 0845)  iopamidol (ISOVUE-300) 61 % injection 100 mL (100 mLs Intravenous Contrast Given 02/28/17 2207)  heparin lock flush 100 unit/mL (500 Units Intracatheter Given 03/02/17 0940)     Initial Impression / Assessment and Plan / ED Course  I have reviewed the triage vital signs and the nursing notes.  Pertinent labs & imaging results that were available during my care of the patient were reviewed by me and considered in my medical decision making (see chart for details).  61 year old male presents with painless rectal bleeding. Vitals are  normal. CBC remarkable for leukocytosis of 13.8. CMP unremarkable. Hemoccult was positive. Shared visit with Dr. Lita Mains. Will order CT scan due to history.  CT remarkable for colon mass with evidence of widespread mets which can be better evaluated with colonoscopy. Will admit for further evaluation.  Final Clinical Impressions(s) / ED Diagnoses   Final diagnoses:  Rectal bleeding  Colonic mass    New Prescriptions New Prescriptions   No medications on file     Iris Pert 03/02/17 1058    Julianne Rice, MD 03/08/17 [phone]

## 2017-03-01 ENCOUNTER — Encounter (HOSPITAL_COMMUNITY)
Admission: EM | Disposition: A | Discharge status: Home / Self Care | Payer: Self-pay | Source: Home / Self Care | Attending: Internal Medicine

## 2017-03-01 ENCOUNTER — Encounter (HOSPITAL_COMMUNITY): Payer: Self-pay | Admitting: Internal Medicine

## 2017-03-01 DIAGNOSIS — K921 Melena: Secondary | ICD-10-CM | POA: Diagnosis not present

## 2017-03-01 DIAGNOSIS — Z7982 Long term (current) use of aspirin: Secondary | ICD-10-CM | POA: Diagnosis not present

## 2017-03-01 DIAGNOSIS — D72829 Elevated white blood cell count, unspecified: Secondary | ICD-10-CM

## 2017-03-01 DIAGNOSIS — K629 Disease of anus and rectum, unspecified: Secondary | ICD-10-CM | POA: Diagnosis not present

## 2017-03-01 DIAGNOSIS — R911 Solitary pulmonary nodule: Secondary | ICD-10-CM

## 2017-03-01 DIAGNOSIS — C8318 Mantle cell lymphoma, lymph nodes of multiple sites: Secondary | ICD-10-CM | POA: Diagnosis not present

## 2017-03-01 DIAGNOSIS — C8310 Mantle cell lymphoma, unspecified site: Secondary | ICD-10-CM | POA: Diagnosis present

## 2017-03-01 DIAGNOSIS — H548 Legal blindness, as defined in USA: Secondary | ICD-10-CM | POA: Diagnosis present

## 2017-03-01 DIAGNOSIS — R599 Enlarged lymph nodes, unspecified: Secondary | ICD-10-CM | POA: Diagnosis not present

## 2017-03-01 DIAGNOSIS — Z87891 Personal history of nicotine dependence: Secondary | ICD-10-CM | POA: Diagnosis not present

## 2017-03-01 DIAGNOSIS — K625 Hemorrhage of anus and rectum: Secondary | ICD-10-CM | POA: Diagnosis not present

## 2017-03-01 DIAGNOSIS — C189 Malignant neoplasm of colon, unspecified: Secondary | ICD-10-CM | POA: Diagnosis not present

## 2017-03-01 DIAGNOSIS — C787 Secondary malignant neoplasm of liver and intrahepatic bile duct: Secondary | ICD-10-CM | POA: Diagnosis not present

## 2017-03-01 DIAGNOSIS — C8319 Mantle cell lymphoma, extranodal and solid organ sites: Secondary | ICD-10-CM | POA: Diagnosis not present

## 2017-03-01 DIAGNOSIS — Z79899 Other long term (current) drug therapy: Secondary | ICD-10-CM | POA: Diagnosis not present

## 2017-03-01 DIAGNOSIS — H3552 Pigmentary retinal dystrophy: Secondary | ICD-10-CM | POA: Diagnosis present

## 2017-03-01 HISTORY — PX: FLEXIBLE SIGMOIDOSCOPY: SHX5431

## 2017-03-01 LAB — CBC
HCT: 39.9 % (ref 39.0–52.0)
HCT: 40.5 % (ref 39.0–52.0)
HCT: 41.6 % (ref 39.0–52.0)
HEMOGLOBIN: 13.1 g/dL (ref 13.0–17.0)
Hemoglobin: 12.7 g/dL — ABNORMAL LOW (ref 13.0–17.0)
Hemoglobin: 12.8 g/dL — ABNORMAL LOW (ref 13.0–17.0)
MCH: 28.5 pg (ref 26.0–34.0)
MCH: 28.5 pg (ref 26.0–34.0)
MCH: 28.3 pg (ref 26.0–34.0)
MCHC: 31.6 g/dL (ref 30.0–36.0)
MCHC: 31.8 g/dL (ref 30.0–36.0)
MCHC: 31.5 g/dL (ref 30.0–36.0)
MCV: 89.7 fL (ref 78.0–100.0)
MCV: 90.2 fL (ref 78.0–100.0)
MCV: 89.8 fL (ref 78.0–100.0)
Platelets: 220 10*3/uL (ref 150–400)
Platelets: 220 10*3/uL (ref 150–400)
Platelets: 222 10*3/uL (ref 150–400)
RBC: 4.45 MIL/uL (ref 4.22–5.81)
RBC: 4.49 MIL/uL (ref 4.22–5.81)
RBC: 4.63 MIL/uL (ref 4.22–5.81)
RDW: 14.4 % (ref 11.5–15.5)
RDW: 14.4 % (ref 11.5–15.5)
RDW: 14.1 % (ref 11.5–15.5)
WBC: 14.1 10*3/uL — AB (ref 4.0–10.5)
WBC: 14.2 10*3/uL — ABNORMAL HIGH (ref 4.0–10.5)
WBC: 13.9 10*3/uL — ABNORMAL HIGH (ref 4.0–10.5)

## 2017-03-01 LAB — BASIC METABOLIC PANEL
Anion gap: 7 (ref 5–15)
BUN: 10 mg/dL (ref 6–20)
CALCIUM: 8.7 mg/dL — AB (ref 8.9–10.3)
CHLORIDE: 103 mmol/L (ref 101–111)
CO2: 29 mmol/L (ref 22–32)
CREATININE: 0.83 mg/dL (ref 0.61–1.24)
GFR calc Af Amer: >60 mL/min (ref >60)
GFR calc non Af Amer: >60 mL/min (ref >60)
Glucose, Bld: 162 mg/dL — ABNORMAL HIGH (ref 65–99)
Potassium: 3.4 mmol/L — ABNORMAL LOW (ref 3.5–5.1)
Sodium: 139 mmol/L (ref 135–145)

## 2017-03-01 LAB — HIV ANTIBODY (ROUTINE TESTING W REFLEX): HIV SCREEN 4TH GENERATION: NONREACTIVE

## 2017-03-01 LAB — GLUCOSE, CAPILLARY: Glucose-Capillary: 175 mg/dL — ABNORMAL HIGH (ref 65–99)

## 2017-03-01 SURGERY — SIGMOIDOSCOPY, FLEXIBLE
Anesthesia: Moderate Sedation

## 2017-03-01 MED ORDER — ONDANSETRON HCL 4 MG/2ML IJ SOLN: 4.0000 mg | Freq: Four times a day (QID) | INTRAMUSCULAR | Status: DC | PRN

## 2017-03-01 MED ORDER — SODIUM CHLORIDE 0.9% FLUSH
10.0000 mL | INTRAVENOUS | Status: DC | PRN
  Administered 2017-03-02: 10 mL
  Filled 2017-03-01: qty 40

## 2017-03-01 MED ORDER — ONDANSETRON HCL 4 MG PO TABS: 4.0000 mg | ORAL_TABLET | Freq: Four times a day (QID) | ORAL | Status: DC | PRN

## 2017-03-01 MED ORDER — DEXTROSE-NACL 5-0.9 % IV SOLN
INTRAVENOUS | Status: AC
  Administered 2017-03-01 (×2): via INTRAVENOUS

## 2017-03-01 MED ORDER — ACETAMINOPHEN 650 MG RE SUPP: 650.0000 mg | Freq: Four times a day (QID) | RECTAL | Status: DC | PRN

## 2017-03-01 MED ORDER — ACETAMINOPHEN 325 MG PO TABS: 650.0000 mg | ORAL_TABLET | Freq: Four times a day (QID) | ORAL | Status: DC | PRN

## 2017-03-01 MED ORDER — POLYETHYLENE GLYCOL 3350 17 G PO PACK
17.0000 g | PACK | Freq: Two times a day (BID) | ORAL | Status: DC
  Administered 2017-03-01 (×2): 17 g via ORAL
  Filled 2017-03-01 (×2): qty 1

## 2017-03-01 MED ORDER — SODIUM CHLORIDE 0.9% FLUSH
10.0000 mL | Freq: Two times a day (BID) | INTRAVENOUS | Status: DC
  Administered 2017-03-01: 10 mL

## 2017-03-01 NOTE — Progress Notes (Signed)
Kristopher Johnson   DOB:1955-12-14   KD#:[id number]   LZJ#:[id number]  Oncology follow up note   Subjective: The patient is seen in the hospital for persistent hematochezia over the last couple months that happens when he moves his bowels.   He last saw Dr. Irene Limbo in March who he says he told about his BMs. He denies fever, but has night sweats and has lost about 8 pounds in 2 months. He has a loss of appetite and experiences fatigue.  He says he feels fine and is ready to go home.    Objective:  Vitals:   03/01/17 1335 03/01/17 1340  BP: (!) 146/76 138/74  Pulse: 80 80  Resp: 20 18  Temp:      Body mass index is 28.91 kg/m.  Intake/Output Summary (Last 24 hours) at 03/01/17 1704 Last data filed at 03/01/17 0600  Gross per 24 hour  Intake           276.25 ml  Output                0 ml  Net           276.25 ml     Sclerae unicteric  Oropharynx clear  No peripheral adenopathy  Lungs clear -- no rales or rhonchi  Heart regular rate and rhythm  Abdomen benign  MSK no focal spinal tenderness, no peripheral edema  Neuro nonfocal   CBG (last 3)   Recent Labs  03/01/17 0759  GLUCAP 175*     Labs:  Lab Results  Component Value Date   WBC 13.9 (H) 03/01/2017   HGB 13.1 03/01/2017   HCT 41.6 03/01/2017   MCV 89.8 03/01/2017   PLT 222 03/01/2017   NEUTROABS 11.4 (H) 02/28/2017   CMP Latest Ref Rng & Units 03/01/2017 02/28/2017 01/01/2017  Glucose 65 - 99 mg/dL 162(H) 119(H) 190(H)  BUN 6 - 20 mg/dL 10 11 14.5  Creatinine 0.61 - 1.24 mg/dL 0.83 0.85 1.0  Sodium 135 - 145 mmol/L 139 140 140  Potassium 3.5 - 5.1 mmol/L 3.4(L) 3.5 4.1  Chloride 101 - 111 mmol/L 103 105 -  CO2 22 - 32 mmol/L 29 27 26   Calcium 8.9 - 10.3 mg/dL 8.7(L) 8.9 9.4  Total Protein 6.5 - 8.1 g/dL - 6.8 6.4  Total Bilirubin 0.3 - 1.2 mg/dL - 0.4 0.35  Alkaline Phos 38 - 126 U/L - 77 98  AST 15 - 41 U/L - 13(L) 10  ALT 17 - 63 U/L - 15(L) 16     Urine Studies No results for input(s): UHGB, CRYS in  the last 72 hours.  Invalid input(s): UACOL, UAPR, USPG, UPH, UTP, UGL, UKET, UBIL, UNIT, UROB, ULEU, UEPI, UWBC, URBC, UBAC, CAST, UCOM, BILUA  Basic Metabolic Panel:  Recent Labs Lab 02/28/17 2011 03/01/17 0505  NA 140 139  K 3.5 3.4*  CL 105 103  CO2 27 29  GLUCOSE 119* 162*  BUN 11 10  CREATININE 0.85 0.83  CALCIUM 8.9 8.7*   GFR Estimated Creatinine Clearance: 103.6 mL/min (by C-G formula based on SCr of 0.83 mg/dL). Liver Function Tests:  Recent Labs Lab 02/28/17 2011  AST 13*  ALT 15*  ALKPHOS 77  BILITOT 0.4  PROT 6.8  ALBUMIN 3.7   No results for input(s): LIPASE, AMYLASE in the last 168 hours. No results for input(s): AMMONIA in the last 168 hours. Coagulation profile No results for input(s): INR, PROTIME in the last 168 hours.  CBC:  Recent  Labs Lab 02/28/17 2011 03/01/17 0505 03/01/17 0911  WBC 13.8* 14.1*  14.2* 13.9*  NEUTROABS 11.4*  --   --   HGB 13.2 12.8*  12.7* 13.1  HCT 42.6 40.5  39.9 41.6  MCV 89.9 90.2  89.7 89.8  PLT 238 220  220 222   Cardiac Enzymes: No results for input(s): CKTOTAL, CKMB, CKMBINDEX, TROPONINI in the last 168 hours. BNP: Invalid input(s): POCBNP CBG:  Recent Labs Lab 03/01/17 0759  GLUCAP 175*   D-Dimer No results for input(s): DDIMER in the last 72 hours. Hgb A1c No results for input(s): HGBA1C in the last 72 hours. Lipid Profile No results for input(s): CHOL, HDL, LDLCALC, TRIG, CHOLHDL, LDLDIRECT in the last 72 hours. Thyroid function studies No results for input(s): TSH, T4TOTAL, T3FREE, THYROIDAB in the last 72 hours.  Invalid input(s): FREET3 Anemia work up No results for input(s): VITAMINB12, FOLATE, FERRITIN, TIBC, IRON, RETICCTPCT in the last 72 hours. Microbiology No results found for this or any previous visit (from the past 240 hour(s)).    Studies:  Ct Abdomen Pelvis W Contrast  Result Date: 02/28/2017 CLINICAL DATA:  Blood in bowel movements EXAM: CT ABDOMEN AND PELVIS WITH  CONTRAST TECHNIQUE: Multidetector CT imaging of the abdomen and pelvis was performed using the standard protocol following bolus administration of intravenous contrast. CONTRAST:  158mL ISOVUE-300 IOPAMIDOL (ISOVUE-300) INJECTION 61% COMPARISON:  10/24/2015, PET CT 6 9,017 FINDINGS: Lower chest: Lung bases demonstrate no pleural effusion. Interim development of multiple pulmonary nodules, for example right posterior lower lobe pulmonary nodule measuring 9 mm, series 4, image number 53. Right lateral subpleural pulmonary nodule measuring 13 mm, series 4, image number 52. Multiple pleural-based masses along the lingula and left lower lobe. Normal heart size. Hepatobiliary: Stable left hepatic lobe cyst. No calcified gallstones or biliary dilatation. Multiple nodules studding the diaphragmatic surface, for example series 2, image number 21 Pancreas: Unremarkable. No pancreatic ductal dilatation or surrounding inflammatory changes. Spleen: Normal in size without focal abnormality. Adrenals/Urinary Tract: Diffuse nodularity of the left adrenal gland which is unchanged. Right adrenal gland normal. No hydronephrosis. Small hypodensities at the left renal pelvis do not appear changed compared to prior PET-CT and may reflect small parapelvic cysts. Bladder normal. Stomach/Bowel: The stomach is nonenlarged. There is no evidence for a bowel obstruction. Interim finding of large intraluminal mass within the rectosigmoid colon, measuring approximately 9.1 x 4.4 cm. Appendix is normal. Vascular/Lymphatic: Non aneurysmal aorta. Diffuse adenopathy has developed. 2.8 x 1.9 cm mass with adjacent nodules in the right posterior cardiophrenic space. Mesenteric nodule, anterior to the duodenum measuring 15 mm, series 2, image number 44. Soft tissue mass posterior to the distal descending thoracic aorta measuring 2.7 cm. Multiple pelvic nodules. A left pelvic sidewall lymph node measures 1.6 cm, series 2, image number 69. 2.7 cm  perirectal mass, series 2, image number 63. Reproductive: Prostate is unremarkable. Other: Bilateral bowel containing inguinal hernias. Development of multiple soft tissue nodules within the subcutaneous fat of the abdominopelvic wall, for example right parasagittal anterior nodule measuring 9 mm, series 2, image number 31, right lower 9 mm soft tissue nodule, series 2, image number 38, and left posterior gluteal region nodule measuring 13 mm, series 2, image number 58. Musculoskeletal: No acute or suspicious bone lesion is visualized. IMPRESSION: 1. Large intraluminal mass within the rectosigmoid colon, new since prior CT. Further evaluation with colonoscopy is recommended. Development of multiple pulmonary pulmonary nodules and pleural based masses suspicious for metastatic disease. Development of diaphragmatic,  cardio phrenic, retroperitoneal, mesenteric, and pelvic masses also suspicious for metastatic disease. Development of multiple subcutaneous soft tissue nodules within the abdomen and pelvis. Uncertain if the constellation of findings represents recurrence of patient's lymphoma or new primary in the colon with metastatic disease. 2. Large bowel containing inguinal hernias with no evidence for obstruction. Electronically Signed   By: Donavan Foil M.D.   On: 02/28/2017 22:50    Assessment: 61 y.o.   1. Hematochezia secondary to rectal mass 2. Rectal mass, likely malignant, primary rectal cancer versus mantle cell lymphoma 3. 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 Leukocytosis  Recommendations:  -Dr. Havery Moros did flexible sigmoidoscopy today, which revealed 2 masses in the proximal rectum and rectosigmoid colon, biopsied, results still pending. These are highly suspicious for malignancy. -I reviewed his CT scan findings, which showed multiple pulmonary nodules, new abdominal soft tissue metastasis versus adenopathy, highly suspicious for  metastatic malignancy (from rectal cancer) vs recurrent mantle cell lymphoma -I recommend outpatient PET scan for further evaluation -If rectal mass biopsy showed adenocarcinoma, he may require biopsy of the abdominal soft tissue mass/node, to distinguish mantle cell lymphoma recurrence versus metastatic rectal cancer. -He has appointment with Dr. Irene Limbo next Thursday, he will make it -OK to discharge home from oncology standpoint    This document serves as a record of services personally performed by Truitt Merle, MD. It was created on her behalf by Brandt Loosen, a trained medical scribe. The creation of this record is based on the scribe's personal observations and the provider's statements to them. This document has been checked and approved by the attending provider.  Truitt Merle, MD 03/01/2017  5:04 PM

## 2017-03-01 NOTE — Progress Notes (Signed)
[facility] PROGRESS NOTE    Progress Note  Kristopher Johnson  RCB:[id number] DOB: 29-Dec-1955 DOA: 02/28/2017 PCP: Patient, No Pcp Per     Brief Narrative:   Kristopher Johnson is an 61 y.o. 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 CT scan of the abdomen and pelvis is concerning for colorectal mass with multiple metastatic lesions.  Assessment/Plan:   Rectal bleeding: Continue the patient nothing by mouth we have consulted  GI for possible colonoscopy and biopsy. His hemoglobin on admission was 13.2 repeated hemoglobin this morning was 12.7, continue to current hemoglobin.   Leukocytosis: Has remained afebrile CT scan of the abdomen pelvis showed no mass with diffuse metastases and lung and liver likely cause of his leukocytosis.  Mantle cell lymphoma of lymph nodes of multiple regions [facility]) [facility] Dr. Irene Limbo to consultants.  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:   DVT prophylaxis: SCD's Family Communication:none Disposition Plan/Barrier to D/C: unable to detemrine Code Status:     Code Status Orders        Start     Ordered   03/01/17 0123  Full code  Continuous     03/01/17 0123    Code Status History    Date Active Date Inactive Code Status Order ID Comments User Context   10/24/2015 11:17 PM 10/25/2015  8:51 PM Full Code [id number]  [name], MD Inpatient        IV Access:    Peripheral IV   Procedures and diagnostic studies:   Ct Abdomen Pelvis W Contrast  Result Date: 02/28/2017 CLINICAL DATA:  Blood in bowel movements EXAM: CT ABDOMEN AND PELVIS WITH CONTRAST TECHNIQUE: Multidetector CT imaging of the abdomen and pelvis was performed using the standard protocol following bolus administration of intravenous contrast. CONTRAST:  183mL ISOVUE-300 IOPAMIDOL (ISOVUE-300) INJECTION 61% COMPARISON:  10/24/2015, PET  CT 6 9,017 FINDINGS: Lower chest: Lung bases demonstrate no pleural effusion. Interim development of multiple pulmonary nodules, for example right posterior lower lobe pulmonary nodule measuring 9 mm, series 4, image number 53. Right lateral subpleural pulmonary nodule measuring 13 mm, series 4, image number 52. Multiple pleural-based masses along the lingula and left lower lobe. Normal heart size. Hepatobiliary: Stable left hepatic lobe cyst. No calcified gallstones or biliary dilatation. Multiple nodules studding the diaphragmatic surface, for example series 2, image number 21 Pancreas: Unremarkable. No pancreatic ductal dilatation or surrounding inflammatory changes. Spleen: Normal in size without focal abnormality. Adrenals/Urinary Tract: Diffuse nodularity of the left adrenal gland which is unchanged. Right adrenal gland normal. No hydronephrosis. Small hypodensities at the left renal pelvis do not appear changed compared to prior PET-CT and may reflect small parapelvic cysts. Bladder normal. Stomach/Bowel: The stomach is nonenlarged. There is no evidence for a bowel obstruction. Interim finding of large intraluminal mass within the rectosigmoid colon, measuring approximately 9.1 x 4.4 cm. Appendix is normal. Vascular/Lymphatic: Non aneurysmal aorta. Diffuse adenopathy has developed. 2.8 x 1.9 cm mass with adjacent nodules in the right posterior cardiophrenic space. Mesenteric nodule, anterior to the duodenum measuring 15 mm, series 2, image number 44. Soft tissue mass posterior to the distal descending thoracic aorta measuring 2.7 cm. Multiple pelvic nodules. A left pelvic sidewall lymph node measures 1.6 cm, series 2, image number 69. 2.7 cm perirectal mass, series 2, image number 63. Reproductive: Prostate is unremarkable. Other: Bilateral bowel containing inguinal hernias. Development  of multiple soft tissue nodules within the subcutaneous fat of the abdominopelvic wall, for example right parasagittal  anterior nodule measuring 9 mm, series 2, image number 31, right lower 9 mm soft tissue nodule, series 2, image number 38, and left posterior gluteal region nodule measuring 13 mm, series 2, image number 58. Musculoskeletal: No acute or suspicious bone lesion is visualized. IMPRESSION: 1. Large intraluminal mass within the rectosigmoid colon, new since prior CT. Further evaluation with colonoscopy is recommended. Development of multiple pulmonary pulmonary nodules and pleural based masses suspicious for metastatic disease. Development of diaphragmatic, cardio phrenic, retroperitoneal, mesenteric, and pelvic masses also suspicious for metastatic disease. Development of multiple subcutaneous soft tissue nodules within the abdomen and pelvis. Uncertain if the constellation of findings represents recurrence of patient's lymphoma or new primary in the colon with metastatic disease. 2. Large bowel containing inguinal hernias with no evidence for obstruction. Electronically Signed   By: Donavan Foil M.D.   On: 02/28/2017 22:50     Medical Consultants:    None.  Anti-Infectives:   None  Subjective:    [name] no new complains.  Objective:    Vitals:   02/28/17 1921 02/28/17 2318 03/01/17 0130  BP: 130/85 (!) 151/79 (!) 141/78  Pulse: 92 81 81  Resp: 20 20 18   Temp: 99.1 F (37.3 C) 98 F (36.7 C) 98.4 F (36.9 C)  TempSrc: Oral Oral Oral  SpO2: 96% 95% 99%  Weight: 93.5 kg (206 lb 3.2 oz)  91.4 kg (201 lb 8 oz)  Height:   5\' 10"  (1.778 m)    Intake/Output Summary (Last 24 hours) at 03/01/17 1052 Last data filed at 03/01/17 0600  Gross per 24 hour  Intake           276.25 ml  Output                0 ml  Net           276.25 ml   Filed Weights   02/28/17 1921 03/01/17 0130  Weight: 93.5 kg (206 lb 3.2 oz) 91.4 kg (201 lb 8 oz)    Exam: General exam: In no acute distress Respiratory system: Good air movement and Clear to auscultation. Cardiovascular system: Regular  rate and rhythm with positive S1 and S2 Gastrointestinal system: Positive bowel sounds soft and nontender.  Central nervous system: Awake alert and oriented 3 nonfocal Extremities: No lower extremity edema Skin: No rashes or ulcerations Psychiatry: Judgment and insight appear normal.   Data Reviewed:    Labs: Basic Metabolic Panel:  Recent Labs Lab 02/28/17 2011 03/01/17 0505  NA 140 139  K 3.5 3.4*  CL 105 103  CO2 27 29  GLUCOSE 119* 162*  BUN 11 10  CREATININE 0.85 0.83  CALCIUM 8.9 8.7*   GFR Estimated Creatinine Clearance: 103.6 mL/min (by C-G formula based on SCr of 0.83 mg/dL). Liver Function Tests:  Recent Labs Lab 02/28/17 2011  AST 13*  ALT 15*  ALKPHOS 77  BILITOT 0.4  PROT 6.8  ALBUMIN 3.7   No results for input(s): LIPASE, AMYLASE in the last 168 hours. No results for input(s): AMMONIA in the last 168 hours. Coagulation profile No results for input(s): INR, PROTIME in the last 168 hours.  CBC:  Recent Labs Lab 02/28/17 2011 03/01/17 0505 03/01/17 0911  WBC 13.8* 14.1*  14.2* 13.9*  NEUTROABS 11.4*  --   --   HGB 13.2 12.8*  12.7* 13.1  HCT 42.6 40.5  39.9 41.6  MCV 89.9 90.2  89.7 89.8  PLT 238 220  220 222   Cardiac Enzymes: No results for input(s): CKTOTAL, CKMB, CKMBINDEX, TROPONINI in the last 168 hours. BNP (last 3 results) No results for input(s): PROBNP in the last 8760 hours. CBG:  Recent Labs Lab 03/01/17 0759  GLUCAP 175*   D-Dimer: No results for input(s): DDIMER in the last 72 hours. Hgb A1c: No results for input(s): HGBA1C in the last 72 hours. Lipid Profile: No results for input(s): CHOL, HDL, LDLCALC, TRIG, CHOLHDL, LDLDIRECT in the last 72 hours. Thyroid function studies: No results for input(s): TSH, T4TOTAL, T3FREE, THYROIDAB in the last 72 hours.  Invalid input(s): FREET3 Anemia work up: No results for input(s): VITAMINB12, FOLATE, FERRITIN, TIBC, IRON, RETICCTPCT in the last 72 hours. Sepsis  Labs:  Recent Labs Lab 02/28/17 2011 03/01/17 0505 03/01/17 0911  WBC 13.8* 14.1*  14.2* 13.9*   Microbiology No results found for this or any previous visit (from the past 240 hour(s)).   Medications:   . sodium chloride flush  10-40 mL Intracatheter Q12H   Continuous Infusions: . dextrose 5 % and 0.9% NaCl 75 mL/hr at 03/01/17 0219    Time spent: 25 min   LOS: 0 days   Charlynne Cousins  [facility] Pager [phone]  *Please refer to [email or web address], password [email or web address] to get updated schedule on who will round on this patient, as hospitalists switch teams weekly. If 7PM-7AM, please contact night-coverage at [email or web address], password [email or web address] for any overnight needs.  03/01/2017, 10:52 AM

## 2017-03-01 NOTE — Interval H&P Note (Signed)
History and Physical Interval Note:  03/01/2017 1:21 PM  Kristopher Johnson  has presented today for surgery, with the diagnosis of rectal bleeding, colon mass  The various methods of treatment have been discussed with the patient and family. After consideration of risks, benefits and other options for treatment, the patient has consented to  Procedure(s): FLEXIBLE SIGMOIDOSCOPY (N/A) as a surgical intervention .  The patient's history has been reviewed, patient examined, no change in status, stable for surgery.  I have reviewed the patient's chart and labs.  Questions were answered to the patient's satisfaction.     [name]

## 2017-03-01 NOTE — H&P (View-Only) (Signed)
Consultation  Referring Provider: [facility]/ Primary Care Physician:  Patient, No Pcp Per Primary Gastroenterologist:  None- unassigned  Reason for Consultation:  Colon mass on CT  HPI: Kristopher Johnson is a 61 y.o. 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 Patient is being managed by Dr. Irene Limbo. He completed 6 cycles of CHOP and has been on Rituxan for maintenance therapy every 2 months. Patient says he is due next week. Patient presented to the emergency room early this morning with complaints of rectal bleeding. He says he has been noticing blood intermittently in his stool over the past couple of months. This has increased in frequency, to the point where he is seeing blood with almost every bowel movement. When questioned further he has been noticing some rectal discomfort with bowel movements. He has not had any abdominal pain until today and says he now has had some crampy discomfort in his lower abdomen. Appetite has been decreased over the past few months and his weight is down about 10 pounds. [name] Patient has not had any previous GI evaluation, no previous colonoscopy. 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  Labs done this a.m. WBC 14.2, hemoglobin 12.7 on arrival and on repeat hemoglobin stable at 13.1. LFTs within normal limits. CT of the abdomen and pelvis was done which shows multiple pulmonary nodules with interval development, multiple pleural-based masses bilaterally, he also has multiple hepatic nodules studding the surface of the liver and a large mass in the rectosigmoid colon measuring 9.1 x 4.4 cm and a 2.9 x 1.9 cm mass with adjacent nodules in the right ureter cardia fundic . Also has multiple soft tissue nodules within the subcutaneous fat of the abdominal wall. Last PET scan was done in June 2017-showing dramatic improvement of adenopathy, no evidence for hepatic metastases and no abnormality of colon  noted.   Past Medical History:  Diagnosis Date  . Abscess of arm, right 10/27/2015  . Lymphoma of lymph nodes of multiple sites ([place]) 10/27/2015  . 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 Shortness of breath 10/27/2015    Past Surgical History:  Procedure Laterality Date  . Cataract surgery     Patient notes she has had bilateral Surgery in 1998 and 99  . TONSILLECTOMY     about 61years old    Prior to Admission medications   Medication Sig Start Date End Date Taking? Authorizing Provider  aspirin EC 325 MG tablet Take 325 mg by mouth every 6 (six) hours as needed (swelling).   Yes [provider]  lidocaine-prilocaine (EMLA) cream Apply 1 application topically as needed. Patient not taking: Reported on 11/06/2016 11/15/15   Brunetta Genera, MD  ondansetron (ZOFRAN) 8 MG tablet Take 1 tablet (8 mg total) by mouth 2 (two) times daily as needed for refractory nausea / vomiting. Patient not taking: Reported on 11/06/2016 11/07/15   Brunetta Genera, MD  prochlorperazine (COMPAZINE) 10 MG tablet Take 1 tablet (10 mg total) by mouth every 6 (six) hours as needed (Nausea or vomiting). Patient not taking: Reported on 11/06/2016 11/07/15   Brunetta Genera, MD    Current Facility-Administered Medications  Medication Dose Route Frequency Provider Last Rate Last Dose  . acetaminophen (TYLENOL) tablet 650 mg  650 mg Oral Q6H PRN [name], MD  Or  . acetaminophen (TYLENOL) suppository 650 mg  650 mg Rectal Q6H PRN [name], MD      . dextrose 5 %-0.9 % sodium chloride infusion   Intravenous Continuous [name], MD 75 mL/hr at 03/01/17 0219    . ondansetron (ZOFRAN) tablet 4 mg  4 mg Oral Q6H PRN [name], MD       Or  . ondansetron [facility]) injection 4 mg  4 mg Intravenous Q6H PRN [name], MD      . sodium chloride flush  (NS) 0.9 % injection 10-40 mL  10-40 mL Intracatheter Q12H [name], MD      . sodium chloride flush (NS) 0.9 % injection 10-40 mL  10-40 mL Intracatheter PRN [name], MD        Allergies as of 02/28/2017  . (No Known Allergies)    Family History  Problem Relation Age of Onset  . Colon cancer Neg Hx     Social History   Social History  . Marital status: Widowed    Spouse name: N/A  . Number of children: N/A  . Years of education: N/A   Occupational History  . Not on file.   Social History Main Topics  . Smoking status: Current Every Day Smoker    Packs/day: 1.00    Years: 33.00  . Smokeless tobacco: Never Used  . Alcohol use No  . Drug use: No  . Sexual activity: Not on file     Comment: Disabled, retinis pigmentosa legally blind, 4children MPOA Lynnn(cousin)   Other Topics Concern  . Not on file   Social History Narrative  . No narrative on file    Review of Systems: Pertinent positive and negative review of systems were noted in the above HPI section.  All other review of systems was otherwise negative. Physical Exam: Vital signs in last 24 hours: Temp:  [98 F (36.7 C)-99.1 F (37.3 C)] 98.4 F (36.9 C) (05/25 0130) Pulse Rate:  [81-92] 81 (05/25 0130) Resp:  [18-20] 18 (05/25 0130) BP: (130-151)/(78-85) 141/78 (05/25 0130) SpO2:  [95 %-99 %] 99 % (05/25 0130) Weight:  [201 lb 8 oz (91.4 kg)-206 lb 3.2 oz (93.5 kg)] 201 lb 8 oz (91.4 kg) (05/25 0130) Last BM Date: 03/01/17 General:   Alert,  Well-developed, well-nourished,AA male , pleasant and cooperative in NAD Head:  Normocephalic and atraumatic. Eyes:  Sclera clear, no icterus.   Conjunctiva pink. Ears:  Normal auditory acuity. Nose:  No deformity, discharge,  or lesions. Mouth:  No deformity or lesions.   Neck:  Supple; no masses or thyromegaly. Lungs:  Clear throughout to auscultation.   No wheezes, crackles, or rhonchi. Port-A-Cath in left chest wall  Heart:  Regular  rate and rhythm; no murmurs, clicks, rubs,  or gallops. Abdomen:  Soft, mildly tender across the lower abdomen there is no guarding or rebound no definite palpable mass or hepatosplenomegaly bowel sounds are present, Rectal exam not done-patient just had a bowel movement which is in the commode no obvious blood very small amount of stool  Msk:  Symmetrical without gross deformities. . Pulses:  Normal pulses noted. Extremities:  Without clubbing or edema. Neurologic:  Alert and  oriented x4;  grossly normal neurologically. Skin:  Intact without significant lesions or rashes.. Psych:  Alert and cooperative. Normal mood and affect.  Intake/Output from previous day: 05/24 0701 - 05/25 0700 In: 276.3 [I.V.:276.3] Out: -  Intake/Output this shift: No  intake/output data recorded.  Lab Results:  Recent Labs  02/28/17 2011 03/01/17 0505 03/01/17 0911  WBC 13.8* 14.1*  14.2* 13.9*  HGB 13.2 12.8*  12.7* 13.1  HCT 42.6 40.5  39.9 41.6  PLT 238 220  220 222   BMET  Recent Labs  02/28/17 2011 03/01/17 0505  NA 140 139  K 3.5 3.4*  CL 105 103  CO2 27 29  GLUCOSE 119* 162*  BUN 11 10  CREATININE 0.85 0.83  CALCIUM 8.9 8.7*   LFT  Recent Labs  02/28/17 2011  PROT 6.8  ALBUMIN 3.7  AST 13*  ALT 15*  ALKPHOS 77  BILITOT 0.4   PT/INR No results for input(s): LABPROT, INR in the last 72 hours. Hepatitis Panel No results for input(s): HEPBSAG, HCVAB, HEPAIGM, HEPBIGM in the last 72 hours.     IMPRESSION:  #20 61 year old African-American male with stage IV mantle cell lymphoma diagnosed January 2017, status post 6 cycles of CHOP and now on maintenance therapy with Rituxan to 2 months who presents with intermittent bright red blood with his bowel movements over the past couple of months and increasing in frequency. Appetite has been poor and weight is down about 10 pounds. CT scan of the abdomen and pelvis this morning shows evidence consistent with widely metastatic  disease with multiple pulmonary nodules, multiple pleural-based masses, multiple hepatic nodules studding the surface of the liver, tissue nodules in the abdominal wall and a mass in the posterior cardiophrenic space in addition to a large 9.1 x 4.4 cm mass involving the rectosigmoid colon. It is unclear whether he has a primary colorectal malignancy which has metastasized versus widely metastatic disease secondary to known stage IV mantle cell lymphoma and lymphomatous mass of  Bowel Fortunately hemoglobin is normal and he has not had any significant active GI bleeding to date. #2  retinitis pigmentosa-legally blind  PLAN: Discussed CT findings with the patient. We will proceed with flexible sigmoidoscopy this afternoon with goal to biopsy the large colorectal mass. If this is colon cancer, now widely metastatic a  complete colonoscopy is not going to change management.  Please call Dr.Kale , and ask oncology to see and follow. Thank you for the consult. We will follow with you.    Jeaneane Adamec  03/01/2017, 10:40 AM

## 2017-03-01 NOTE — Consult Note (Signed)
Consultation  Referring Provider: [facility]/ Primary Care Physician:  Patient, No Pcp Per Primary Gastroenterologist:  None- unassigned  Reason for Consultation:  Colon mass on CT  HPI: Kristopher Johnson is a 61 y.o. 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 Patient is being managed by Dr. Irene Limbo. He completed 6 cycles of CHOP and has been on Rituxan for maintenance therapy every 2 months. Patient says he is due next week. Patient presented to the emergency room early this morning with complaints of rectal bleeding. He says he has been noticing blood intermittently in his stool over the past couple of months. This has increased in frequency, to the point where he is seeing blood with almost every bowel movement. When questioned further he has been noticing some rectal discomfort with bowel movements. He has not had any abdominal pain until today and says he now has had some crampy discomfort in his lower abdomen. Appetite has been decreased over the past few months and his weight is down about 10 pounds. [name] Patient has not had any previous GI evaluation, no previous colonoscopy. 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  Labs done this a.m. WBC 14.2, hemoglobin 12.7 on arrival and on repeat hemoglobin stable at 13.1. LFTs within normal limits. CT of the abdomen and pelvis was done which shows multiple pulmonary nodules with interval development, multiple pleural-based masses bilaterally, he also has multiple hepatic nodules studding the surface of the liver and a large mass in the rectosigmoid colon measuring 9.1 x 4.4 cm and a 2.9 x 1.9 cm mass with adjacent nodules in the right ureter cardia fundic . Also has multiple soft tissue nodules within the subcutaneous fat of the abdominal wall. Last PET scan was done in June 2017-showing dramatic improvement of adenopathy, no evidence for hepatic metastases and no abnormality of colon  noted.   Past Medical History:  Diagnosis Date  . Abscess of arm, right 10/27/2015  . Lymphoma of lymph nodes of multiple sites ([place]) 10/27/2015  . 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 Shortness of breath 10/27/2015    Past Surgical History:  Procedure Laterality Date  . Cataract surgery     Patient notes she has had bilateral Surgery in 1998 and 99  . TONSILLECTOMY     about 61years old    Prior to Admission medications   Medication Sig Start Date End Date Taking? Authorizing Provider  aspirin EC 325 MG tablet Take 325 mg by mouth every 6 (six) hours as needed (swelling).   Yes [provider]  lidocaine-prilocaine (EMLA) cream Apply 1 application topically as needed. Patient not taking: Reported on 11/06/2016 11/15/15   Brunetta Genera, MD  ondansetron (ZOFRAN) 8 MG tablet Take 1 tablet (8 mg total) by mouth 2 (two) times daily as needed for refractory nausea / vomiting. Patient not taking: Reported on 11/06/2016 11/07/15   Brunetta Genera, MD  prochlorperazine (COMPAZINE) 10 MG tablet Take 1 tablet (10 mg total) by mouth every 6 (six) hours as needed (Nausea or vomiting). Patient not taking: Reported on 11/06/2016 11/07/15   Brunetta Genera, MD    Current Facility-Administered Medications  Medication Dose Route Frequency Provider Last Rate Last Dose  . acetaminophen (TYLENOL) tablet 650 mg  650 mg Oral Q6H PRN [name], MD  Or  . acetaminophen (TYLENOL) suppository 650 mg  650 mg Rectal Q6H PRN [name], MD      . dextrose 5 %-0.9 % sodium chloride infusion   Intravenous Continuous [name], MD 75 mL/hr at 03/01/17 0219    . ondansetron (ZOFRAN) tablet 4 mg  4 mg Oral Q6H PRN [name], MD       Or  . ondansetron [facility]) injection 4 mg  4 mg Intravenous Q6H PRN [name], MD      . sodium chloride flush  (NS) 0.9 % injection 10-40 mL  10-40 mL Intracatheter Q12H [name], MD      . sodium chloride flush (NS) 0.9 % injection 10-40 mL  10-40 mL Intracatheter PRN [name], MD        Allergies as of 02/28/2017  . (No Known Allergies)    Family History  Problem Relation Age of Onset  . Colon cancer Neg Hx     Social History   Social History  . Marital status: Widowed    Spouse name: N/A  . Number of children: N/A  . Years of education: N/A   Occupational History  . Not on file.   Social History Main Topics  . Smoking status: Current Every Day Smoker    Packs/day: 1.00    Years: 33.00  . Smokeless tobacco: Never Used  . Alcohol use No  . Drug use: No  . Sexual activity: Not on file     Comment: Disabled, retinis pigmentosa legally blind, 4children MPOA Lynnn(cousin)   Other Topics Concern  . Not on file   Social History Narrative  . No narrative on file    Review of Systems: Pertinent positive and negative review of systems were noted in the above HPI section.  All other review of systems was otherwise negative. Physical Exam: Vital signs in last 24 hours: Temp:  [98 F (36.7 C)-99.1 F (37.3 C)] 98.4 F (36.9 C) (05/25 0130) Pulse Rate:  [81-92] 81 (05/25 0130) Resp:  [18-20] 18 (05/25 0130) BP: (130-151)/(78-85) 141/78 (05/25 0130) SpO2:  [95 %-99 %] 99 % (05/25 0130) Weight:  [201 lb 8 oz (91.4 kg)-206 lb 3.2 oz (93.5 kg)] 201 lb 8 oz (91.4 kg) (05/25 0130) Last BM Date: 03/01/17 General:   Alert,  Well-developed, well-nourished,AA male , pleasant and cooperative in NAD Head:  Normocephalic and atraumatic. Eyes:  Sclera clear, no icterus.   Conjunctiva pink. Ears:  Normal auditory acuity. Nose:  No deformity, discharge,  or lesions. Mouth:  No deformity or lesions.   Neck:  Supple; no masses or thyromegaly. Lungs:  Clear throughout to auscultation.   No wheezes, crackles, or rhonchi. Port-A-Cath in left chest wall  Heart:  Regular  rate and rhythm; no murmurs, clicks, rubs,  or gallops. Abdomen:  Soft, mildly tender across the lower abdomen there is no guarding or rebound no definite palpable mass or hepatosplenomegaly bowel sounds are present, Rectal exam not done-patient just had a bowel movement which is in the commode no obvious blood very small amount of stool  Msk:  Symmetrical without gross deformities. . Pulses:  Normal pulses noted. Extremities:  Without clubbing or edema. Neurologic:  Alert and  oriented x4;  grossly normal neurologically. Skin:  Intact without significant lesions or rashes.. Psych:  Alert and cooperative. Normal mood and affect.  Intake/Output from previous day: 05/24 0701 - 05/25 0700 In: 276.3 [I.V.:276.3] Out: -  Intake/Output this shift: No  intake/output data recorded.  Lab Results:  Recent Labs  02/28/17 2011 03/01/17 0505 03/01/17 0911  WBC 13.8* 14.1*  14.2* 13.9*  HGB 13.2 12.8*  12.7* 13.1  HCT 42.6 40.5  39.9 41.6  PLT 238 220  220 222   BMET  Recent Labs  02/28/17 2011 03/01/17 0505  NA 140 139  K 3.5 3.4*  CL 105 103  CO2 27 29  GLUCOSE 119* 162*  BUN 11 10  CREATININE 0.85 0.83  CALCIUM 8.9 8.7*   LFT  Recent Labs  02/28/17 2011  PROT 6.8  ALBUMIN 3.7  AST 13*  ALT 15*  ALKPHOS 77  BILITOT 0.4   PT/INR No results for input(s): LABPROT, INR in the last 72 hours. Hepatitis Panel No results for input(s): HEPBSAG, HCVAB, HEPAIGM, HEPBIGM in the last 72 hours.     IMPRESSION:  #29 61 year old African-American male with stage IV mantle cell lymphoma diagnosed January 2017, status post 6 cycles of CHOP and now on maintenance therapy with Rituxan to 2 months who presents with intermittent bright red blood with his bowel movements over the past couple of months and increasing in frequency. Appetite has been poor and weight is down about 10 pounds. CT scan of the abdomen and pelvis this morning shows evidence consistent with widely metastatic  disease with multiple pulmonary nodules, multiple pleural-based masses, multiple hepatic nodules studding the surface of the liver, tissue nodules in the abdominal wall and a mass in the posterior cardiophrenic space in addition to a large 9.1 x 4.4 cm mass involving the rectosigmoid colon. It is unclear whether he has a primary colorectal malignancy which has metastasized versus widely metastatic disease secondary to known stage IV mantle cell lymphoma and lymphomatous mass of  Bowel Fortunately hemoglobin is normal and he has not had any significant active GI bleeding to date. #2  retinitis pigmentosa-legally blind  PLAN: Discussed CT findings with the patient. We will proceed with flexible sigmoidoscopy this afternoon with goal to biopsy the large colorectal mass. If this is colon cancer, now widely metastatic a  complete colonoscopy is not going to change management.  Please call Dr.Kale , and ask oncology to see and follow. Thank you for the consult. We will follow with you.    Amy Esterwood  03/01/2017, 10:40 AM

## 2017-03-01 NOTE — H&P (Signed)
History and Physical    Kristopher Johnson WNU:[id number] DOB: December 16, 1955 DOA: 02/28/2017  PCP: Patient, No Pcp Per  Patient coming from: Home.  Chief Complaint: Rectal bleeding.  HPI: Kristopher Johnson is a 61 y.o. male with history of mantle cell lymphoma on maintenance therapy presents to the ER because of persistent rectal bleeding over the last couple of months which has been progressively worsening. Patient states he has been having rectal bleeding every time he moves bowels and happens at least 2-3 times a day. Denies any nausea or vomiting.   ED Course: In the ER patient started developing some lower abdominal pain. CT of the abdomen and pelvis is concerning for colorectal mass. CT also shows metastatic lesions. Patient is being admitted for further management. Abdomen appears benign on exam. Patient is hemodynamically stable.  Review of Systems: As per HPI, rest all negative.   Past Medical History:  Diagnosis Date  . Abscess of arm, right 10/27/2015  . Lymphoma of lymph nodes of multiple sites ([place]) 10/27/2015  . 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 Shortness of breath 10/27/2015    Past Surgical History:  Procedure Laterality Date  . Cataract surgery     Patient notes she has had bilateral Surgery in 1998 and 99  . TONSILLECTOMY     about 61years old     reports that he has been smoking.  He has a 33.00 pack-year smoking history. He has never used smokeless tobacco. He reports that he does not drink alcohol or use drugs.  No Known Allergies  Family History  Problem Relation Age of Onset  . Colon cancer Neg Hx     Prior to Admission medications   Medication Sig Start Date End Date Taking? Authorizing Provider  aspirin EC 325 MG tablet Take 325 mg by mouth every 6 (six) hours as needed (swelling).   Yes [provider]  lidocaine-prilocaine (EMLA) cream Apply 1  application topically as needed. Patient not taking: Reported on 11/06/2016 11/15/15   Brunetta Genera, MD  ondansetron (ZOFRAN) 8 MG tablet Take 1 tablet (8 mg total) by mouth 2 (two) times daily as needed for refractory nausea / vomiting. Patient not taking: Reported on 11/06/2016 11/07/15   Brunetta Genera, MD  prochlorperazine (COMPAZINE) 10 MG tablet Take 1 tablet (10 mg total) by mouth every 6 (six) hours as needed (Nausea or vomiting). Patient not taking: Reported on 11/06/2016 11/07/15   Brunetta Genera, MD    Physical Exam: Vitals:   02/28/17 1921 02/28/17 2318  BP: 130/85 (!) 151/79  Pulse: 92 81  Resp: 20 20  Temp: 99.1 F (37.3 C) 98 F (36.7 C)  TempSrc: Oral Oral  SpO2: 96% 95%  Weight: 93.5 kg (206 lb 3.2 oz)       Constitutional: Moderately built and nourished. Vitals:   02/28/17 1921 02/28/17 2318  BP: 130/85 (!) 151/79  Pulse: 92 81  Resp: 20 20  Temp: 99.1 F (37.3 C) 98 F (36.7 C)  TempSrc: Oral Oral  SpO2: 96% 95%  Weight: 93.5 kg (206 lb 3.2 oz)    Eyes: Anicteric no pallor. ENMT: No discharge from the ears eyes nose and mouth. Neck: No mass felt. No neck rigidity. Respiratory: No rhonchi or crepitations. Cardiovascular: S1-S2 heard no murmurs appreciated. Abdomen: Soft nontender bowel sounds present. Musculoskeletal: No edema. No joint effusion. Skin: No rash.  Neurologic: Alert awake oriented to time place and person. Moves all extremities. Psychiatric: Appears normal. Normal affect.   Labs on Admission: I have personally reviewed following labs and imaging studies  CBC:  Recent Labs Lab 02/28/17 2011  WBC 13.8*  NEUTROABS 11.4*  HGB 13.2  HCT 42.6  MCV 89.9  PLT 431   Basic Metabolic Panel:  Recent Labs Lab 02/28/17 2011  NA 140  K 3.5  CL 105  CO2 27  GLUCOSE 119*  BUN 11  CREATININE 0.85  CALCIUM 8.9   GFR: Estimated Creatinine Clearance: 100.4 mL/min (by C-G formula based on SCr of 0.85 mg/dL). Liver  Function Tests:  Recent Labs Lab 02/28/17 2011  AST 13*  ALT 15*  ALKPHOS 77  BILITOT 0.4  PROT 6.8  ALBUMIN 3.7   No results for input(s): LIPASE, AMYLASE in the last 168 hours. No results for input(s): AMMONIA in the last 168 hours. Coagulation Profile: No results for input(s): INR, PROTIME in the last 168 hours. Cardiac Enzymes: No results for input(s): CKTOTAL, CKMB, CKMBINDEX, TROPONINI in the last 168 hours. BNP (last 3 results) No results for input(s): PROBNP in the last 8760 hours. HbA1C: No results for input(s): HGBA1C in the last 72 hours. CBG: No results for input(s): GLUCAP in the last 168 hours. Lipid Profile: No results for input(s): CHOL, HDL, LDLCALC, TRIG, CHOLHDL, LDLDIRECT in the last 72 hours. Thyroid Function Tests: No results for input(s): TSH, T4TOTAL, FREET4, T3FREE, THYROIDAB in the last 72 hours. Anemia Panel: No results for input(s): VITAMINB12, FOLATE, FERRITIN, TIBC, IRON, RETICCTPCT in the last 72 hours. Urine analysis:    Component Value Date/Time   COLORURINE YELLOW 10/24/2015 1705   APPEARANCEUR CLOUDY (A) 10/24/2015 1705   LABSPEC 1.024 10/24/2015 1705   PHURINE 5.0 10/24/2015 1705   GLUCOSEU NEGATIVE 10/24/2015 1705   HGBUR NEGATIVE 10/24/2015 1705   BILIRUBINUR NEGATIVE 10/24/2015 1705   KETONESUR NEGATIVE 10/24/2015 1705   PROTEINUR 30 (A) 10/24/2015 1705   NITRITE NEGATIVE 10/24/2015 1705   LEUKOCYTESUR NEGATIVE 10/24/2015 1705   Sepsis Labs: @LABRCNTIP (procalcitonin:4,lacticidven:4) )No results found for this or any previous visit (from the past 240 hour(s)).   Radiological Exams on Admission: Ct Abdomen Pelvis W Contrast  Result Date: 02/28/2017 CLINICAL DATA:  Blood in bowel movements EXAM: CT ABDOMEN AND PELVIS WITH CONTRAST TECHNIQUE: Multidetector CT imaging of the abdomen and pelvis was performed using the standard protocol following bolus administration of intravenous contrast. CONTRAST:  149mL ISOVUE-300 IOPAMIDOL  (ISOVUE-300) INJECTION 61% COMPARISON:  10/24/2015, PET CT 6 9,017 FINDINGS: Lower chest: Lung bases demonstrate no pleural effusion. Interim development of multiple pulmonary nodules, for example right posterior lower lobe pulmonary nodule measuring 9 mm, series 4, image number 53. Right lateral subpleural pulmonary nodule measuring 13 mm, series 4, image number 52. Multiple pleural-based masses along the lingula and left lower lobe. Normal heart size. Hepatobiliary: Stable left hepatic lobe cyst. No calcified gallstones or biliary dilatation. Multiple nodules studding the diaphragmatic surface, for example series 2, image number 21 Pancreas: Unremarkable. No pancreatic ductal dilatation or surrounding inflammatory changes. Spleen: Normal in size without focal abnormality. Adrenals/Urinary Tract: Diffuse nodularity of the left adrenal gland which is unchanged. Right adrenal gland normal. No hydronephrosis. Small hypodensities at the left renal pelvis do not appear changed compared to prior PET-CT and may reflect small parapelvic cysts. Bladder normal. Stomach/Bowel: The stomach is nonenlarged. There is no evidence for a bowel obstruction. Interim finding of large intraluminal mass within the rectosigmoid colon, measuring  approximately 9.1 x 4.4 cm. Appendix is normal. Vascular/Lymphatic: Non aneurysmal aorta. Diffuse adenopathy has developed. 2.8 x 1.9 cm mass with adjacent nodules in the right posterior cardiophrenic space. Mesenteric nodule, anterior to the duodenum measuring 15 mm, series 2, image number 44. Soft tissue mass posterior to the distal descending thoracic aorta measuring 2.7 cm. Multiple pelvic nodules. A left pelvic sidewall lymph node measures 1.6 cm, series 2, image number 69. 2.7 cm perirectal mass, series 2, image number 63. Reproductive: Prostate is unremarkable. Other: Bilateral bowel containing inguinal hernias. Development of multiple soft tissue nodules within the subcutaneous fat of the  abdominopelvic wall, for example right parasagittal anterior nodule measuring 9 mm, series 2, image number 31, right lower 9 mm soft tissue nodule, series 2, image number 38, and left posterior gluteal region nodule measuring 13 mm, series 2, image number 58. Musculoskeletal: No acute or suspicious bone lesion is visualized. IMPRESSION: 1. Large intraluminal mass within the rectosigmoid colon, new since prior CT. Further evaluation with colonoscopy is recommended. Development of multiple pulmonary pulmonary nodules and pleural based masses suspicious for metastatic disease. Development of diaphragmatic, cardio phrenic, retroperitoneal, mesenteric, and pelvic masses also suspicious for metastatic disease. Development of multiple subcutaneous soft tissue nodules within the abdomen and pelvis. Uncertain if the constellation of findings represents recurrence of patient's lymphoma or new primary in the colon with metastatic disease. 2. Large bowel containing inguinal hernias with no evidence for obstruction. Electronically Signed   By: Donavan Foil M.D.   On: 02/28/2017 22:50     Assessment/Plan Principal Problem:   Rectal bleeding Active Problems:   Mantle cell lymphoma of lymph nodes of multiple regions ([place])   Legally blind    1. Rectal bleeding with rectosigmoid mass with possible metastasis - will keep patient nothing by mouth and follow CBC. Consult gastroenterologist in a.m. for colonoscopy. 2. Mantle cell lymphoma being followed by oncologist Dr. Irene Limbo. 3. Retinitis pigmentosa legally blind.   DVT prophylaxis: SCDs. Code Status: Full code.  Family Communication: Discussed with patient.  Disposition Plan: Home.  Consults called: None.  Admission status: Observation.    [name] MD [facility] Pager [phone].  If 7PM-7AM, please contact night-coverage [email or web address] Password [email or web address]  03/01/2017, 1:24 AM

## 2017-03-01 NOTE — Op Note (Signed)
[facility] Patient Name: Kristopher Johnson Procedure Date: 03/01/2017 MRN: [id number] Attending MD: Carlota Raspberry. Armbruster MD, MD Date of Birth: 1956-05-04 CSN: [id number] Age: 61 Admit Type: Inpatient Procedure:                Flexible Sigmoidoscopy Indications:              Hematochezia, Abnormal CT of the GI tract with                            concern for rectal mass Providers:                Remo Lipps P. Armbruster MD, MD, Carolynn Comment, RN,                            William Dalton, Technician Referring MD:              Medicines:                None Complications:            No immediate complications. Estimated blood loss:                            Minimal. Estimated Blood Loss:     Estimated blood loss was minimal. Procedure:                Pre-Anesthesia Assessment:                           - Prior to the procedure, a History and Physical                            was performed, and patient medications and                            allergies were reviewed. The patient's tolerance of                            previous anesthesia was also reviewed. The risks                            and benefits of the procedure and the sedation                            options and risks were discussed with the patient.                            All questions were answered, and informed consent                            was obtained. Prior Anticoagulants: The patient has                            taken no previous anticoagulant or antiplatelet                            agents. ASA  Grade Assessment: III - A patient with                            severe systemic disease. After reviewing the risks                            and benefits, the patient was deemed in                            satisfactory condition to undergo the procedure.                           After obtaining informed consent, the scope was                            passed under direct vision. The  [id number] ([id number])                            scope was introduced through the anus and advanced                            to the the rectosigmoid junction. The flexible                            sigmoidoscopy was accomplished without difficulty.                            The patient tolerated the procedure well. The                            quality of the bowel preparation was adequate. Scope In: Scope Out: Findings:      The perianal exam findings include nodular area along dentate line.      2 separate large mass lesions were found, in the proximal rectum and in       the recto-sigmoid colon. There was a lobulated proximal rectal mass       lesion, distal edge roughly 10cm from the dentate line, which was       ulcerated and inflamed. There was another larger, partially obstructing       mass lesion in the rectosigmoid colon, which was able to be traversed       and lumen was open. The proximal aspect of this larger mass was not able       to be completely visualized due to retained stool. Multiple biopsies       were taken with a cold forceps for histology.      A localized small area of nodular mucosa was found in the distal rectum,       just proximal to the dentate line. Biopsies were taken with a cold       forceps for histology. Impression:               - Nodular area along dentate line found on perianal                            exam.                           -  Likely malignant mass lesions in the proximal                            rectum and in the recto-sigmoid colon. Biopsied.                           - Nodular mucosa in the distal rectum. Biopsied. Moderate Sedation:      None Recommendation:           - Return patient to hospital ward for ongoing care.                           - Resume previous diet.                           - Continue present medications.                           - Miralax double dose twice daily to keep stools                             loose, prevent obstruction                           - Await pathology results                           - Oncology and Radiation Oncology consultation Procedure Code(s):        --- Professional ---                           [phone], 81, Sigmoidoscopy, flexible; with biopsy,                            single or multiple Diagnosis Code(s):        --- Professional ---                           D49.0, Neoplasm of unspecified behavior of                            digestive system                           K62.89, Other specified diseases of anus and rectum                           K92.1, Melena (includes Hematochezia)                           R93.3, Abnormal findings on diagnostic imaging of                            other parts of digestive tract CPT copyright 2016 [employer]. All rights reserved. The codes documented in this report are preliminary and upon coder review may  be revised to meet current compliance requirements. Remo Lipps P. Armbruster  MD, MD 03/01/2017 1:47:27 PM This report has been signed electronically. Number of Addenda: 0

## 2017-03-02 ENCOUNTER — Encounter (HOSPITAL_COMMUNITY): Payer: Self-pay | Admitting: Gastroenterology

## 2017-03-02 DIAGNOSIS — K6289 Other specified diseases of anus and rectum: Secondary | ICD-10-CM

## 2017-03-02 DIAGNOSIS — C189 Malignant neoplasm of colon, unspecified: Secondary | ICD-10-CM

## 2017-03-02 DIAGNOSIS — C8318 Mantle cell lymphoma, lymph nodes of multiple sites: Secondary | ICD-10-CM

## 2017-03-02 DIAGNOSIS — K629 Disease of anus and rectum, unspecified: Secondary | ICD-10-CM

## 2017-03-02 DIAGNOSIS — C787 Secondary malignant neoplasm of liver and intrahepatic bile duct: Secondary | ICD-10-CM

## 2017-03-02 LAB — GLUCOSE, CAPILLARY
Glucose-Capillary: 131 mg/dL — ABNORMAL HIGH (ref 65–99)
Glucose-Capillary: 221 mg/dL — ABNORMAL HIGH (ref 65–99)

## 2017-03-02 MED ORDER — HEPARIN SOD (PORK) LOCK FLUSH 100 UNIT/ML IV SOLN
500.0000 [IU] | INTRAVENOUS | Status: AC | PRN
  Administered 2017-03-02: 500 [IU]

## 2017-03-02 NOTE — Progress Notes (Signed)
Patient without acute events overnight. Feels well, wants to go home.  He can be discharged from my perspective. He has no obstructive symptoms and the endoscope was able to traverse the mass lesions without difficulty yesterday, but I counseled him it is very important to take miralax twice daily (double dose) to keep stools loose to help prevent obstruction. I should have pathology results back early next week. He is going to have PET scan done as outpatient and has follow up with Dr. Irene Limbo next week to discuss treatment options.   Kristopher Cellar, MD [facility] Pager [phone]

## 2017-03-02 NOTE — Discharge Summary (Addendum)
Physician Discharge Summary  Kristopher Johnson UMP:[id number] DOB: 1956/10/07 DOA: 02/28/2017  PCP: Patient, No Pcp Per  Admit date: 02/28/2017 Discharge date: 03/02/2017  Admitted From: home Disposition:  Home  Recommendations for Outpatient Follow-up:  1. Follow up with Oncology in 1 weeks, follow up on Biopsy results 2. Please obtain CBC in one week, to monitor hbg. 3. He needs to be referred to hospice and palliative care as an outpatient.  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 Oncology was consulted who will follow-up with them as an outpatient.  Leukocytosis: Has remained afebrile likely due to metastatic disease.  Mantle cell lymphoma of lymph nodes: Follow-up with oncology as an outpatient.  Discharge Instructions  Discharge Instructions    Diet - low sodium heart healthy    Complete by:  As directed    Increase activity slowly    Complete by:  As directed      Allergies as of 03/02/2017   No Known Allergies     Medication List    STOP taking these medications   aspirin EC 325 MG tablet     TAKE these medications   lidocaine-prilocaine cream Commonly known as:  EMLA Apply 1 application topically as needed.    ondansetron 8 MG tablet Commonly known as:  ZOFRAN Take 1 tablet (8 mg total) by mouth 2 (two) times daily as needed for refractory nausea / vomiting.   prochlorperazine 10 MG tablet Commonly known as:  COMPAZINE Take 1 tablet (10 mg total) by mouth every 6 (six) hours as needed (Nausea or vomiting).       No Known Allergies  Consultations:  GI   Procedures/Studies: Ct Abdomen Pelvis W Contrast  Result Date: 02/28/2017 CLINICAL DATA:  Blood in bowel movements EXAM: CT ABDOMEN AND PELVIS WITH CONTRAST TECHNIQUE: Multidetector CT imaging of the abdomen and pelvis was performed using the standard protocol following bolus administration of intravenous contrast. CONTRAST:  118mL ISOVUE-300 IOPAMIDOL (ISOVUE-300) INJECTION 61% COMPARISON:  10/24/2015, PET CT 6 9,017 FINDINGS: Lower chest: Lung bases demonstrate no pleural effusion. Interim development of multiple pulmonary nodules, for example right posterior lower lobe pulmonary nodule measuring 9 mm, series 4, image number 53. Right lateral subpleural pulmonary nodule measuring 13 mm, series 4, image number 52. Multiple pleural-based masses along the lingula and left lower lobe. Normal heart size. Hepatobiliary: Stable left hepatic lobe cyst. No calcified gallstones or biliary dilatation. Multiple nodules studding the diaphragmatic surface, for example series 2, image number 21 Pancreas: Unremarkable. No pancreatic ductal dilatation or surrounding inflammatory changes. Spleen: Normal in size without focal abnormality. Adrenals/Urinary Tract: Diffuse nodularity of the left adrenal gland which is unchanged. Right adrenal gland normal. No hydronephrosis. Small hypodensities at the left renal pelvis do not appear changed compared to prior PET-CT and may reflect small parapelvic cysts.  Bladder normal. Stomach/Bowel: The stomach is nonenlarged. There is no evidence for a bowel obstruction. Interim finding of large intraluminal mass within the  rectosigmoid colon, measuring approximately 9.1 x 4.4 cm. Appendix is normal. Vascular/Lymphatic: Non aneurysmal aorta. Diffuse adenopathy has developed. 2.8 x 1.9 cm mass with adjacent nodules in the right posterior cardiophrenic space. Mesenteric nodule, anterior to the duodenum measuring 15 mm, series 2, image number 44. Soft tissue mass posterior to the distal descending thoracic aorta measuring 2.7 cm. Multiple pelvic nodules. A left pelvic sidewall lymph node measures 1.6 cm, series 2, image number 69. 2.7 cm perirectal mass, series 2, image number 63. Reproductive: Prostate is unremarkable. Other: Bilateral bowel containing inguinal hernias. Development of multiple soft tissue nodules within the subcutaneous fat of the abdominopelvic wall, for example right parasagittal anterior nodule measuring 9 mm, series 2, image number 31, right lower 9 mm soft tissue nodule, series 2, image number 38, and left posterior gluteal region nodule measuring 13 mm, series 2, image number 58. Musculoskeletal: No acute or suspicious bone lesion is visualized. IMPRESSION: 1. Large intraluminal mass within the rectosigmoid colon, new since prior CT. Further evaluation with colonoscopy is recommended. Development of multiple pulmonary pulmonary nodules and pleural based masses suspicious for metastatic disease. Development of diaphragmatic, cardio phrenic, retroperitoneal, mesenteric, and pelvic masses also suspicious for metastatic disease. Development of multiple subcutaneous soft tissue nodules within the abdomen and pelvis. Uncertain if the constellation of findings represents recurrence of patient's lymphoma or new primary in the colon with metastatic disease. 2. Large bowel containing inguinal hernias with no evidence for obstruction. Electronically Signed   By: Donavan Foil M.D.   On: 02/28/2017 22:50   Subjective: He relates he feels great no new complains.  Discharge Exam: Vitals:   03/01/17 1340 03/01/17 2011   BP: 138/74 127/72  Pulse: 80 74  Resp: 18 18  Temp:  98.1 F (36.7 C)   Vitals:   03/01/17 1330 03/01/17 1335 03/01/17 1340 03/01/17 2011  BP: 138/67 (!) 146/76 138/74 127/72  Pulse: 80 80 80 74  Resp: 19 20 18 18   Temp:    98.1 F (36.7 C)  TempSrc:    Oral  SpO2: 96% 96% 95% 98%  Weight:      Height:        General: Patient is awake alert and oriented 3 no acute distress Cardiovascular: Regular rate and rhythm with positive S1 and S2 Respiratory: Good air movement clear to auscultation. Abdominal: Positive bowel sounds soft nontender nondistended Extremities: No edema or cyanosis.    The results of significant diagnostics from this hospitalization (including imaging, microbiology, ancillary and laboratory) are listed below for reference.     Microbiology: No results found for this or any previous visit (from the past 240 hour(s)).   Labs: BNP (last 3 results) No results for input(s): BNP in the last 8760 hours. Basic Metabolic Panel:  Recent Labs Lab 02/28/17 2011 03/01/17 0505  NA 140 139  K 3.5 3.4*  CL 105 103  CO2 27 29  GLUCOSE 119* 162*  BUN 11 10  CREATININE 0.85 0.83  CALCIUM 8.9 8.7*   Liver Function Tests:  Recent Labs Lab 02/28/17 2011  AST 13*  ALT 15*  ALKPHOS 77  BILITOT 0.4  PROT 6.8  ALBUMIN 3.7   No results for input(s): LIPASE, AMYLASE in the last 168 hours. No results for input(s): AMMONIA in the last 168 hours. CBC:  Recent Labs Lab 02/28/17 2011 03/01/17 0505 03/01/17  0911  WBC 13.8* 14.1*  14.2* 13.9*  NEUTROABS 11.4*  --   --   HGB 13.2 12.8*  12.7* 13.1  HCT 42.6 40.5  39.9 41.6  MCV 89.9 90.2  89.7 89.8  PLT 238 220  220 222   Cardiac Enzymes: No results for input(s): CKTOTAL, CKMB, CKMBINDEX, TROPONINI in the last 168 hours. BNP: Invalid input(s): POCBNP CBG:  Recent Labs Lab 03/01/17 0759 03/02/17 0037 03/02/17 0751  GLUCAP 175* 221* 131*   D-Dimer No results for input(s): DDIMER in the  last 72 hours. Hgb A1c No results for input(s): HGBA1C in the last 72 hours. Lipid Profile No results for input(s): CHOL, HDL, LDLCALC, TRIG, CHOLHDL, LDLDIRECT in the last 72 hours. Thyroid function studies No results for input(s): TSH, T4TOTAL, T3FREE, THYROIDAB in the last 72 hours.  Invalid input(s): FREET3 Anemia work up No results for input(s): VITAMINB12, FOLATE, FERRITIN, TIBC, IRON, RETICCTPCT in the last 72 hours. Urinalysis    Component Value Date/Time   COLORURINE YELLOW 10/24/2015 1705   APPEARANCEUR CLOUDY (A) 10/24/2015 1705   LABSPEC 1.024 10/24/2015 1705   PHURINE 5.0 10/24/2015 1705   GLUCOSEU NEGATIVE 10/24/2015 1705   HGBUR NEGATIVE 10/24/2015 1705   BILIRUBINUR NEGATIVE 10/24/2015 1705   KETONESUR NEGATIVE 10/24/2015 1705   PROTEINUR 30 (A) 10/24/2015 1705   NITRITE NEGATIVE 10/24/2015 1705   LEUKOCYTESUR NEGATIVE 10/24/2015 1705   Sepsis Labs Invalid input(s): PROCALCITONIN,  WBC,  LACTICIDVEN Microbiology No results found for this or any previous visit (from the past 240 hour(s)).   Time coordinating discharge: Over 30 minutes  SIGNED:   Charlynne Cousins, MD  [facility] 03/02/2017, 8:33 AM Pager   If 7PM-7AM, please contact night-coverage [email or web address] Password [email or web address]

## 2017-03-02 NOTE — Progress Notes (Signed)
Went over discharge paperwork with patient.  All questions answered.  Pt wheeled out in wheelchair.

## 2017-03-05 ENCOUNTER — Ambulatory Visit: Payer: Medicare Other

## 2017-03-05 ENCOUNTER — Other Ambulatory Visit: Payer: Medicare Other

## 2017-03-05 ENCOUNTER — Ambulatory Visit: Payer: Medicare Other | Admitting: Hematology

## 2017-03-07 ENCOUNTER — Ambulatory Visit: Payer: Medicare Other

## 2017-03-07 ENCOUNTER — Ambulatory Visit (HOSPITAL_BASED_OUTPATIENT_CLINIC_OR_DEPARTMENT_OTHER): Payer: Medicare Other | Admitting: Hematology

## 2017-03-07 ENCOUNTER — Encounter: Payer: Self-pay | Admitting: Hematology

## 2017-03-07 ENCOUNTER — Other Ambulatory Visit (HOSPITAL_BASED_OUTPATIENT_CLINIC_OR_DEPARTMENT_OTHER): Payer: Medicare Other

## 2017-03-07 VITALS — BP 124/67 | HR 85 | Temp 97.9°F | Resp 18 | Ht 70.0 in | Wt 203.0 lb

## 2017-03-07 DIAGNOSIS — H3552 Pigmentary retinal dystrophy: Secondary | ICD-10-CM | POA: Diagnosis not present

## 2017-03-07 DIAGNOSIS — C8318 Mantle cell lymphoma, lymph nodes of multiple sites: Secondary | ICD-10-CM

## 2017-03-07 DIAGNOSIS — C8598 Non-Hodgkin lymphoma, unspecified, lymph nodes of multiple sites: Secondary | ICD-10-CM

## 2017-03-07 DIAGNOSIS — K6389 Other specified diseases of intestine: Secondary | ICD-10-CM

## 2017-03-07 DIAGNOSIS — Z95828 Presence of other vascular implants and grafts: Secondary | ICD-10-CM

## 2017-03-07 DIAGNOSIS — Z7189 Other specified counseling: Secondary | ICD-10-CM

## 2017-03-07 DIAGNOSIS — R0602 Shortness of breath: Secondary | ICD-10-CM

## 2017-03-07 DIAGNOSIS — R5383 Other fatigue: Secondary | ICD-10-CM

## 2017-03-07 LAB — LACTATE DEHYDROGENASE: LDH: 200 U/L (ref 125–245)

## 2017-03-07 LAB — CBC WITH DIFFERENTIAL/PLATELET
BASO%: 0.6 % (ref 0.0–2.0)
BASOS ABS: 0.1 10*3/uL (ref 0.0–0.1)
EOS%: 8 % — ABNORMAL HIGH (ref 0.0–7.0)
Eosinophils Absolute: 0.8 10*3/uL — ABNORMAL HIGH (ref 0.0–0.5)
HEMATOCRIT: 43 % (ref 38.4–49.9)
HGB: 13.6 g/dL (ref 13.0–17.1)
LYMPH%: 7.7 % — ABNORMAL LOW (ref 14.0–49.0)
MCH: 28.6 pg (ref 27.2–33.4)
MCHC: 31.6 g/dL — ABNORMAL LOW (ref 32.0–36.0)
MCV: 90.3 fL (ref 79.3–98.0)
MONO#: 0.9 10*3/uL (ref 0.1–0.9)
MONO%: 9 % (ref 0.0–14.0)
NEUT#: 7.8 10*3/uL — ABNORMAL HIGH (ref 1.5–6.5)
NEUT%: 74.7 % (ref 39.0–75.0)
Platelets: 236 10*3/uL (ref 140–400)
RBC: 4.76 10*6/uL (ref 4.20–5.82)
RDW: 14.1 % (ref 11.0–14.6)
WBC: 10.5 10*3/uL — ABNORMAL HIGH (ref 4.0–10.3)
lymph#: 0.8 10*3/uL — ABNORMAL LOW (ref 0.9–3.3)

## 2017-03-07 LAB — COMPREHENSIVE METABOLIC PANEL
ALK PHOS: 86 U/L (ref 40–150)
ALT: 11 U/L (ref 0–55)
AST: 11 U/L (ref 5–34)
Albumin: 3.4 g/dL — ABNORMAL LOW (ref 3.5–5.0)
Anion Gap: 9 mEq/L (ref 3–11)
BUN: 9.1 mg/dL (ref 7.0–26.0)
CALCIUM: 9.5 mg/dL (ref 8.4–10.4)
CHLORIDE: 104 meq/L (ref 98–109)
CO2: 28 mEq/L (ref 22–29)
Creatinine: 1 mg/dL (ref 0.7–1.3)
Glucose: 183 mg/dl — ABNORMAL HIGH (ref 70–140)
POTASSIUM: 3.8 meq/L (ref 3.5–5.1)
SODIUM: 141 meq/L (ref 136–145)
Total Bilirubin: 0.34 mg/dL (ref 0.20–1.20)
Total Protein: 6.5 g/dL (ref 6.4–8.3)

## 2017-03-07 NOTE — Progress Notes (Signed)
Kristopher Johnson    HEMATOLOGY/ONCOLOGY CLINIC NOTE  Date of Service: .03/07/2017   Patient Care Team: Patient, No Pcp Per as PCP - General (General Practice)  CHIEF COMPLAINTS/PURPOSE OF CONSULTATION:   followup for New Diagnosed Mantle Cell lymphoma  DIAGNOSIS Stage IV Mantle cell lymphoma diagnosed in 10/25/2015  Current TREATMENT Starting on Bendamustine/ Rituxan  PREVIOUS TREATMENT  Status post R CHOP 6 cycles followed by maintenance Rituxan . Patient chooses not to pursue AutoHSCT based strategies which is quite reasonable considering his functional challenges with legal blindness and limited social support.   HISTORY OF PRESENTING ILLNESS: plz see clinic note for details on initial presentation  Interval History  Kristopher Johnson is here for follow-up of his mantle cell lymphoma. He recently had worsening constipation and some rectal bleeding for which she went to the emergency room. He had a CT scan of the abdomen and pelvis on 02/28/2017 that showed a large intraluminal mass within the rectosigmoid which is new since his prior CT scan. He was also noted to have multiple pulmonary nodules and pleural-based masses and intra-abdominal lymphadenopathy suspicious for recurrent lymphoma versus metastatic disease. Patient had a colonoscopy and biopsy which showed the rectosigmoid mass was consistent with recurrent mantle cell lymphoma. Patient notes some mild rectal bleeding which has now minimal with stable hemoglobin of 13.6. LDH level remains normal. Patient notes some constipation which is better with laxatives.  MEDICAL HISTORY:  Past Medical History:  Diagnosis Date  . Abscess of arm, right 10/27/2015  . Lymphoma of lymph nodes of multiple sites ([place]) 10/27/2015  . 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 Shortness of breath 10/27/2015    SURGICAL HISTORY: Past Surgical History:  Procedure  Laterality Date  . Cataract surgery     Patient notes she has had bilateral Surgery in 1998 and 99  . FLEXIBLE SIGMOIDOSCOPY N/A 03/01/2017   Procedure: FLEXIBLE SIGMOIDOSCOPY;  Surgeon: Manus Gunning, MD;  Location: [facility];  Service: Gastroenterology;  Laterality: N/A;  . TONSILLECTOMY     about 61years old    SOCIAL HISTORY: Social History   Social History  . Marital status: Widowed    Spouse name: N/A  . Number of children: N/A  . Years of education: N/A   Occupational History  . Not on file.   Social History Main Topics  . Smoking status: Current Every Day Smoker    Packs/day: 1.00    Years: 33.00  . Smokeless tobacco: Never Used  . Alcohol use No  . Drug use: No  . Sexual activity: Not on file     Comment: Disabled, retinis pigmentosa legally blind, 4children MPOA Lynnn(cousin)   Other Topics Concern  . Not on file   Social History Narrative  . No narrative on file    FAMILY HISTORY: Family History  Problem Relation Age of Onset  . Colon cancer Neg Hx     ALLERGIES:  has No Known Allergies.  MEDICATIONS:  Current Outpatient Prescriptions  Medication Sig Dispense Refill  . lidocaine-prilocaine (EMLA) cream Apply 1 application topically as needed. (Patient not taking: Reported on 11/06/2016) 30 g 6  . ondansetron (ZOFRAN) 8 MG tablet Take 1 tablet (8 mg total) by mouth 2 (two) times daily as needed for refractory nausea / vomiting. (Patient not taking: Reported on 11/06/2016) 30 tablet 1  . prochlorperazine (COMPAZINE) 10 MG tablet Take 1 tablet (10 mg total) by  mouth every 6 (six) hours as needed (Nausea or vomiting). (Patient not taking: Reported on 11/06/2016) 30 tablet 6   No current facility-administered medications for this visit.     REVIEW OF SYSTEMS:    10 Point review of Systems was done is negative except as noted above.  PHYSICAL EXAMINATION: ECOG PERFORMANCE STATUS: 2 - Symptomatic, <50% confined to bed .BP 124/67 (BP Location:  Left Arm, Patient Position: Sitting)   Pulse 85   Temp 97.9 F (36.6 C) (Oral)   Resp 18   Ht 5\' 10"  (1.778 m)   Wt 203 lb (92.1 kg)   SpO2 98%   BMI 29.13 kg/m   GENERAL:alert, in no acute distress and comfortable SKIN: some nodular skin lesions over extremities and back, beginning crusted lesions over his right scapular area. EYES: normal, conjunctiva are pink and non-injected, sclera clear. Poor visual acuity - some directional light perception but difficulty with finger counting. OROPHARYNX:no exudate, no erythema and lips, multiple carious teeth with significant periodontal disease. NECK: supple, no JVD, thyroid normal size, non-tender, without nodularity LYMPH:  no palpable lymphadenopathy in the cervical, axillary or inguinal LUNGS: clear to auscultation with normal respiratory effort HEART: regular rate & rhythm,  no murmurs and no lower extremity edema ABDOMEN: abdomen soft, non-tender, normoactive bowel sounds  Musculoskeletal: no cyanosis of digits and no clubbing  PSYCH: alert & oriented x 3 with fluent speech NEURO: no focal motor/sensory deficits  LABORATORY DATA:  I have reviewed the data as listed   CBC Latest Ref Rng & Units 03/07/2017 03/01/2017 03/01/2017  WBC 4.0 - 10.3 10e3/uL 10.5(H) 13.9(H) 14.1(H)  Hemoglobin 13.0 - 17.1 g/dL 13.6 13.1 12.8(L)  Hematocrit 38.4 - 49.9 % 43.0 41.6 40.5  Platelets 140 - 400 10e3/uL 236 222 220    . CMP Latest Ref Rng & Units 03/07/2017 03/01/2017 02/28/2017  Glucose 70 - 140 mg/dl 183(H) 162(H) 119(H)  BUN 7.0 - 26.0 mg/dL 9.1 10 11   Creatinine 0.7 - 1.3 mg/dL 1.0 0.83 0.85  Sodium 136 - 145 mEq/L 141 139 140  Potassium 3.5 - 5.1 mEq/L 3.8 3.4(L) 3.5  Chloride 101 - 111 mmol/L - 103 105  CO2 22 - 29 mEq/L 28 29 27   Calcium 8.4 - 10.4 mg/dL 9.5 8.7(L) 8.9  Total Protein 6.4 - 8.3 g/dL 6.5 - 6.8  Total Bilirubin 0.20 - 1.20 mg/dL 0.34 - 0.4  Alkaline Phos 40 - 150 U/L 86 - 77  AST 5 - 34 U/L 11 - 13(L)  ALT 0 - 55 U/L 11  - 15(L)    Lab Results  Component Value Date   LDH 200 03/07/2017     RADIOGRAPHIC STUDIES: I have personally reviewed the radiological images as listed and agreed with the findings in the report. Ct Abdomen Pelvis W Contrast  Result Date: 02/28/2017 CLINICAL DATA:  Blood in bowel movements EXAM: CT ABDOMEN AND PELVIS WITH CONTRAST TECHNIQUE: Multidetector CT imaging of the abdomen and pelvis was performed using the standard protocol following bolus administration of intravenous contrast. CONTRAST:  171mL ISOVUE-300 IOPAMIDOL (ISOVUE-300) INJECTION 61% COMPARISON:  10/24/2015, PET CT 6 9,017 FINDINGS: Lower chest: Lung bases demonstrate no pleural effusion. Interim development of multiple pulmonary nodules, for example right posterior lower lobe pulmonary nodule measuring 9 mm, series 4, image number 53. Right lateral subpleural pulmonary nodule measuring 13 mm, series 4, image number 52. Multiple pleural-based masses along the lingula and left lower lobe. Normal heart size. Hepatobiliary: Stable left hepatic lobe cyst. No calcified  gallstones or biliary dilatation. Multiple nodules studding the diaphragmatic surface, for example series 2, image number 21 Pancreas: Unremarkable. No pancreatic ductal dilatation or surrounding inflammatory changes. Spleen: Normal in size without focal abnormality. Adrenals/Urinary Tract: Diffuse nodularity of the left adrenal gland which is unchanged. Right adrenal gland normal. No hydronephrosis. Small hypodensities at the left renal pelvis do not appear changed compared to prior PET-CT and may reflect small parapelvic cysts. Bladder normal. Stomach/Bowel: The stomach is nonenlarged. There is no evidence for a bowel obstruction. Interim finding of large intraluminal mass within the rectosigmoid colon, measuring approximately 9.1 x 4.4 cm. Appendix is normal. Vascular/Lymphatic: Non aneurysmal aorta. Diffuse adenopathy has developed. 2.8 x 1.9 cm mass with adjacent  nodules in the right posterior cardiophrenic space. Mesenteric nodule, anterior to the duodenum measuring 15 mm, series 2, image number 44. Soft tissue mass posterior to the distal descending thoracic aorta measuring 2.7 cm. Multiple pelvic nodules. A left pelvic sidewall lymph node measures 1.6 cm, series 2, image number 69. 2.7 cm perirectal mass, series 2, image number 63. Reproductive: Prostate is unremarkable. Other: Bilateral bowel containing inguinal hernias. Development of multiple soft tissue nodules within the subcutaneous fat of the abdominopelvic wall, for example right parasagittal anterior nodule measuring 9 mm, series 2, image number 31, right lower 9 mm soft tissue nodule, series 2, image number 38, and left posterior gluteal region nodule measuring 13 mm, series 2, image number 58. Musculoskeletal: No acute or suspicious bone lesion is visualized. IMPRESSION: 1. Large intraluminal mass within the rectosigmoid colon, new since prior CT. Further evaluation with colonoscopy is recommended. Development of multiple pulmonary pulmonary nodules and pleural based masses suspicious for metastatic disease. Development of diaphragmatic, cardio phrenic, retroperitoneal, mesenteric, and pelvic masses also suspicious for metastatic disease. Development of multiple subcutaneous soft tissue nodules within the abdomen and pelvis. Uncertain if the constellation of findings represents recurrence of patient's lymphoma or new primary in the colon with metastatic disease. 2. Large bowel containing inguinal hernias with no evidence for obstruction. Electronically Signed   By: Donavan Foil M.D.   On: 02/28/2017 22:50    ECHO: 11/01/2015: Study Conclusions  - Left ventricle: The cavity size was normal. There was mild concentric hypertrophy. Systolic function was normal. The estimated ejection fraction was in the range of 50% to 55%. Wall motion was normal; there were no regional wall motion abnormalities.  Doppler parameters are consistent with abnormal left ventricular relaxation (grade 1 diastolic dysfunction). - Aortic valve: Transvalvular velocity was within the normal range. There was no stenosis. There was no regurgitation. - Mitral valve: There was no regurgitation. - Right ventricle: The cavity size was normal. Wall thickness was normal. Systolic function was normal. - Tricuspid valve: There was no regurgitation. - Inferior vena cava: The vessel was normal in size. The respirophasic diameter changes were in the normal range (>= 50%), consistent with normal central venous pressure.Study Conclusions  - Left ventricle: The cavity size was normal. There was mild concentric hypertrophy. Systolic function was normal. The estimated ejection fraction was in the range of 50% to 55%. Wall motion was normal; there were no regional wall motion abnormalities. Doppler parameters are consistent with abnormal left ventricular relaxation (grade 1 diastolic dysfunction). - Aortic valve: Transvalvular velocity was within the normal range. There was no stenosis. There was no regurgitation. - Mitral valve: There was no regurgitation. - Right ventricle: The cavity size was normal. Wall thickness was normal. Systolic function was normal. - Tricuspid valve: There was no  regurgitation. - Inferior vena cava: The vessel was normal in size. The respirophasic diameter changes were in the normal range (>= 50%), consistent with normal central venous pressure.    ASSESSMENT & PLAN:    61 yo man with   1) h/o previous Stage IVB E Mantle Cell lymphoma with likely gastric involvement, extensive LNadenopathy. ECHO nl EF ECOG PS 1-2 but activities significantly limited due to being legally blind HIV/Hep C/Hep B neg Baseline LDH 450--->135-->163  PET/CT scan after 3 cycles of R CHOP show good overall response. Stomach lesion still with very FDG uptake ? Residual lymphoma versus  inflammation.  Patient is status post 6 cycles of R CHOP. PET/CT after 6 cycles shows resolution of hypermetabolic lymphadenopathy and gastric wall activity . Patient was on maintenance Rituxan for 1 year now prior to relapse.  2) Relapsed mantle cell lymphoma Presented with rectal bleeding and constipation with a large rectosigmoid mass and other evidence of colonic involvement. CT chest abdomen pelvis showed multiple areas of lymphadenopathy in the abdomen as well as in the chest suggesting significant recurrence.  PLAN -Patient's case was discussed in the hematology report. Review of pathology is consistent with pleomorphic mantle cell lymphoma which tends to have an aggressive course. This explains why he recurred within about a year of finishing R CHOP. -Given the requirement to get a more rapid response to take care of his abdominal symptoms constipation and rectal bleeding we shall plan to treat him with second line bendamustine plus Rituxan as opposed to Ibrutinib at this time. -We discussed the pros and cons of treatment and the status and rationale of treatment as well as treatment goals. -The patient and his daughter are aware that this treatment would not be curative and that this is an aggressive form of mantle cell lymphoma. -She needs a repeat PET/CT scan for baseline assessment -We'll set him up for bendamustine and Rituxan chemotherapy.  If he  tolerates 90 mg/m of bendamustine might increase to 120 mg meter square possibly with Neulasta support and will consider increasing Rituxan to 500 mg/m2 since he did progress on Rituxan. -After treatment might need to consider maintenance Ibrutinib. - We'll see him back in one week for toxicity check.  2) Retinitis pigmentosa causing legal blindness -  is seeing a retina specialist in town. No acute interventions at this time. Notes that his vision is progressively getting worse. His daughter and granddaughter are helping him out at  home. He was encouraged to use some of the resources for the blind or through the retinitis pigmentosa Foundation to try   3) Weight loss and protein calorie malnutrition. Resolved. Weight has improved significantly since his diagnosis and back to his baseline weight. Lost about 5-6 lbs in the last 2 months. Wt Readings from Last 3 Encounters:  03/07/17 203 lb (92.1 kg)  03/01/17 201 lb 8 oz (91.4 kg)  01/01/17 209 lb 14.4 oz (95.2 kg)   4) Peri-orbital dermatitis /Conjuncitivitis - patient was seen by the dermatologist and given topical desonide ointment which led to the resolution of his dermatitis. There were not sure about the etiology but noted then to could not rule out Rituxan is a possibility. Currently his dermatitis has resolved and his conjunctivitis has resolved. He did not have rash anywhere else at the time. No oral sores or other toxicities. This happened about 4 days after Rituxan another chemotherapy. This has completely resolved and patient has not had an further issues with this with several additional doses  of Rituxan.  -continue f/u with PCP for other continued medical issues.  Holding Rituxan today Will need to scheduled for BR in 1 week PET/CT in 3-5 days Chemo-counseling in 3-5 days for BR RTC with Dr Irene Limbo with Labs 1 week post C1D1 BR for toxicity check   I spent 30 minutes counseling the patient face to face. The total time spent in the appointment was 40 minutes and more than 50% was on counseling and direct patient cares. Patient was accompanied by his son and daughter for this visit .    Sullivan Lone MD [place] AAHIVMS [facility] Hematology/Oncology Physician [facility]  (Office):       [phone] (Work cell):  [phone] (Fax):           [phone]

## 2017-03-07 NOTE — Patient Instructions (Signed)
Thank you for choosing [facility] to provide your oncology and hematology care.  To afford each patient quality time with our providers, please arrive 30 minutes before your scheduled appointment time.  If you arrive late for your appointment, you may be asked to reschedule.  We strive to give you quality time with our providers, and arriving late affects you and other patients whose appointments are after yours.  If you are a no show for multiple scheduled visits, you may be dismissed from the clinic at the providers discretion.   Again, thank you for choosing [facility], our hope is that these requests will decrease the amount of time that you wait before being seen by our physicians.  ______________________________________________________________________ Should you have questions after your visit to the  Cancer Center, please contact our office at [phone] between the hours of 8:30 and 4:30 p.m.    Voicemails left after 4:30p.m will not be returned until the following business day.   For prescription refill requests, please have your pharmacy contact us directly.  Please also try to allow 48 hours for prescription requests.   Please contact the scheduling department for questions regarding scheduling.  For scheduling of procedures such as PET scans, CT scans, MRI, Ultrasound, etc please contact central scheduling at [phone].   Resources For Cancer Patients and Caregivers:  American Cancer Society:  800-227-2345  Can help patients locate various types of support and financial assistance Cancer Care: 1-800-813-HOPE (4673) Provides financial assistance, online support groups, medication/co-pay assistance.   [place] DSS:  336-641-3447 Where to apply for food stamps, Medicaid, and utility assistance Medicare Rights Center: 800-333-4114 Helps people with Medicare understand their rights and benefits, navigate the Medicare system, and secure the  quality healthcare they deserve SCAT: 336-333-6589 [place] [employer]'s shared-ride transportation service for eligible riders who have a disability that prevents them from riding the fixed route bus.   For additional information on assistance programs please contact our social worker:   Grier Hock/Abigail Elmore:  [phone] 

## 2017-03-12 ENCOUNTER — Other Ambulatory Visit: Payer: Self-pay | Admitting: *Deleted

## 2017-03-13 ENCOUNTER — Encounter: Payer: Self-pay | Admitting: Hematology

## 2017-03-13 ENCOUNTER — Telehealth: Payer: Self-pay | Admitting: *Deleted

## 2017-03-13 ENCOUNTER — Encounter: Payer: Self-pay | Admitting: *Deleted

## 2017-03-13 ENCOUNTER — Telehealth: Payer: Self-pay | Admitting: Hematology

## 2017-03-13 DIAGNOSIS — Z7189 Other specified counseling: Secondary | ICD-10-CM | POA: Insufficient documentation

## 2017-03-13 MED ORDER — ALLOPURINOL 100 MG PO TABS
100.0000 mg | ORAL_TABLET | Freq: Two times a day (BID) | ORAL | 0 refills | Status: DC
Start: 2017-03-13 — End: 2017-03-13

## 2017-03-13 MED ORDER — ACYCLOVIR 400 MG PO TABS: 400.0000 mg | ORAL_TABLET | Freq: Every day | ORAL | 3 refills | Status: DC

## 2017-03-13 MED ORDER — ALLOPURINOL 100 MG PO TABS: 100.0000 mg | ORAL_TABLET | Freq: Two times a day (BID) | ORAL | 0 refills | Status: DC

## 2017-03-13 MED ORDER — DEXAMETHASONE 4 MG PO TABS: 8.0000 mg | ORAL_TABLET | Freq: Every day | ORAL | 1 refills | Status: DC

## 2017-03-13 MED ORDER — PREDNISONE 20 MG PO TABS: 60.0000 mg | ORAL_TABLET | Freq: Every day | ORAL | 5 refills | Status: DC

## 2017-03-13 MED ORDER — PROCHLORPERAZINE MALEATE 10 MG PO TABS
10.0000 mg | ORAL_TABLET | Freq: Four times a day (QID) | ORAL | 6 refills | Status: DC | PRN
Start: 2017-03-13 — End: 2017-03-13

## 2017-03-13 MED ORDER — ONDANSETRON HCL 8 MG PO TABS
8.0000 mg | ORAL_TABLET | Freq: Two times a day (BID) | ORAL | 1 refills | Status: DC | PRN
Start: 2017-03-13 — End: 2017-03-13

## 2017-03-13 NOTE — Progress Notes (Signed)
DISCONTINUE OFF PATHWAY REGIMEN - Lymphoma and CLL   OFF02101:R-CHOP q21 days:   A cycle is every 21 days:     Rituximab      Cyclophosphamide      Doxorubicin      Vincristine      Prednisone   **Always confirm dose/schedule in your pharmacy ordering system**    REASON: Other Reason PRIOR TREATMENT: Off Pathway: R-CHOP q21 days TREATMENT RESPONSE: Unable to Evaluate  START OFF PATHWAY REGIMEN - Lymphoma and CLL   [id number]:Bendamustine 120 mg/m2 IV Days 1 and 2, q28 Days:   A cycle is every 28 days:     Bendamustine      Pegfilgrastim   **Always confirm dose/schedule in your pharmacy ordering system**    Patient Characteristics: Mantle Cell Lymphoma, Second Line, Not a Transplant Candidate Disease Type: Not Applicable Disease Type: Mantle Cell Line of therapy: Second Line [place] Stage: IV Patient Characteristics: Not a Transplant Candidate  Intent of Therapy: Non-Curative / Palliative Intent, Discussed with Patient

## 2017-03-13 NOTE — Progress Notes (Signed)
START OFF PATHWAY REGIMEN - Lymphoma and CLL   OFF02101:R-CHOP q21 days:   A cycle is every 21 days:     Rituximab      Cyclophosphamide      Doxorubicin      Vincristine      Prednisone   **Always confirm dose/schedule in your pharmacy ordering system**    Patient Characteristics: Mantle Cell Lymphoma, Second Line, Not a Transplant Candidate Disease Type: Not Applicable Disease Type: Mantle Cell Line of therapy: Second Line [place] Stage: IV Patient Characteristics: Not a Transplant Candidate  Intent of Therapy: Non-Curative / Palliative Intent, Discussed with Patient

## 2017-03-13 NOTE — Telephone Encounter (Signed)
Scheduled appt per 5/31 los - unable to reach patient - left message with appt date and time.

## 2017-03-13 NOTE — Telephone Encounter (Signed)
Spoke with patient.  He has been to a previous chemo class last year for r-chop. After discussing with patient, he is ok not coming to another class.   Will cancel app't for Friday 6/8.

## 2017-03-13 NOTE — Progress Notes (Signed)
Patient on plan of care prior to pathways. 

## 2017-03-15 ENCOUNTER — Other Ambulatory Visit: Payer: Medicare Other

## 2017-03-19 ENCOUNTER — Ambulatory Visit (HOSPITAL_BASED_OUTPATIENT_CLINIC_OR_DEPARTMENT_OTHER): Payer: Medicare Other

## 2017-03-19 ENCOUNTER — Telehealth: Payer: Self-pay | Admitting: Hematology

## 2017-03-19 VITALS — BP 115/64 | HR 85 | Temp 98.0°F | Resp 18

## 2017-03-19 DIAGNOSIS — C8318 Mantle cell lymphoma, lymph nodes of multiple sites: Secondary | ICD-10-CM | POA: Diagnosis not present

## 2017-03-19 DIAGNOSIS — C189 Malignant neoplasm of colon, unspecified: Secondary | ICD-10-CM

## 2017-03-19 DIAGNOSIS — C787 Secondary malignant neoplasm of liver and intrahepatic bile duct: Principal | ICD-10-CM

## 2017-03-19 DIAGNOSIS — Z7189 Other specified counseling: Secondary | ICD-10-CM

## 2017-03-19 DIAGNOSIS — Z5112 Encounter for antineoplastic immunotherapy: Secondary | ICD-10-CM | POA: Diagnosis not present

## 2017-03-19 DIAGNOSIS — Z5111 Encounter for antineoplastic chemotherapy: Secondary | ICD-10-CM | POA: Diagnosis not present

## 2017-03-19 LAB — CBC & DIFF AND RETIC
BASO%: 0.6 % (ref 0.0–2.0)
BASOS ABS: 0.1 10*3/uL (ref 0.0–0.1)
EOS ABS: 0.8 10*3/uL — AB (ref 0.0–0.5)
EOS%: 6.6 % (ref 0.0–7.0)
HEMATOCRIT: 42.2 % (ref 38.4–49.9)
HEMOGLOBIN: 13.2 g/dL (ref 13.0–17.1)
IMMATURE RETIC FRACT: 6.4 % (ref 3.00–10.60)
LYMPH#: 0.7 10*3/uL — AB (ref 0.9–3.3)
LYMPH%: 6.1 % — ABNORMAL LOW (ref 14.0–49.0)
MCH: 28 pg (ref 27.2–33.4)
MCHC: 31.3 g/dL — AB (ref 32.0–36.0)
MCV: 89.6 fL (ref 79.3–98.0)
MONO#: 1.2 10*3/uL — AB (ref 0.1–0.9)
MONO%: 10.4 % (ref 0.0–14.0)
NEUT#: 9.1 10*3/uL — ABNORMAL HIGH (ref 1.5–6.5)
NEUT%: 76.3 % — AB (ref 39.0–75.0)
PLATELETS: 249 10*3/uL (ref 140–400)
RBC: 4.71 10*6/uL (ref 4.20–5.82)
RDW: 14.3 % (ref 11.0–14.6)
RETIC %: 1.03 % (ref 0.80–1.80)
RETIC CT ABS: 48.51 10*3/uL (ref 34.80–93.90)
WBC: 11.9 10*3/uL — ABNORMAL HIGH (ref 4.0–10.3)

## 2017-03-19 LAB — COMPREHENSIVE METABOLIC PANEL
ALBUMIN: 3.4 g/dL — AB (ref 3.5–5.0)
ALK PHOS: 84 U/L (ref 40–150)
ALT: 17 U/L (ref 0–55)
ANION GAP: 11 meq/L (ref 3–11)
AST: 14 U/L (ref 5–34)
BILIRUBIN TOTAL: 0.35 mg/dL (ref 0.20–1.20)
BUN: 11.5 mg/dL (ref 7.0–26.0)
CO2: 27 mEq/L (ref 22–29)
CREATININE: 0.9 mg/dL (ref 0.7–1.3)
Calcium: 9.6 mg/dL (ref 8.4–10.4)
Chloride: 104 mEq/L (ref 98–109)
EGFR: >90 mL/min/{1.73_m2} (ref >90)
Glucose: 132 mg/dl (ref 70–140)
Potassium: 3.9 mEq/L (ref 3.5–5.1)
Sodium: 143 mEq/L (ref 136–145)
TOTAL PROTEIN: 6.6 g/dL (ref 6.4–8.3)

## 2017-03-19 MED ORDER — SODIUM CHLORIDE 0.9 % IV SOLN: 10.0000 mg | Freq: Once | INTRAVENOUS | Status: DC

## 2017-03-19 MED ORDER — ACETAMINOPHEN 325 MG PO TABS
ORAL_TABLET | ORAL | Status: AC
  Filled 2017-03-19: qty 2

## 2017-03-19 MED ORDER — SODIUM CHLORIDE 0.9 % IV SOLN
375.0000 mg/m2 | Freq: Once | INTRAVENOUS | Status: AC
  Administered 2017-03-19: 800 mg via INTRAVENOUS
  Filled 2017-03-19: qty 50

## 2017-03-19 MED ORDER — DEXAMETHASONE SODIUM PHOSPHATE 10 MG/ML IJ SOLN
10.0000 mg | Freq: Once | INTRAMUSCULAR | Status: AC
  Administered 2017-03-19: 10 mg via INTRAVENOUS

## 2017-03-19 MED ORDER — BENDAMUSTINE HCL CHEMO INJECTION 100 MG/4ML
90.0000 mg/m2 | Freq: Once | INTRAVENOUS | Status: AC
  Administered 2017-03-19: 200 mg via INTRAVENOUS
  Filled 2017-03-19: qty 8

## 2017-03-19 MED ORDER — DEXAMETHASONE SODIUM PHOSPHATE 10 MG/ML IJ SOLN
INTRAMUSCULAR | Status: AC
  Filled 2017-03-19: qty 1

## 2017-03-19 MED ORDER — PALONOSETRON HCL INJECTION 0.25 MG/5ML
0.2500 mg | Freq: Once | INTRAVENOUS | Status: AC
  Administered 2017-03-19: 0.25 mg via INTRAVENOUS

## 2017-03-19 MED ORDER — SODIUM CHLORIDE 0.9 % IV SOLN
Freq: Once | INTRAVENOUS | Status: AC
  Administered 2017-03-19: 10:00:00 via INTRAVENOUS

## 2017-03-19 MED ORDER — DIPHENHYDRAMINE HCL 25 MG PO CAPS
ORAL_CAPSULE | ORAL | Status: AC
  Filled 2017-03-19: qty 2

## 2017-03-19 MED ORDER — HEPARIN SOD (PORK) LOCK FLUSH 100 UNIT/ML IV SOLN
500.0000 [IU] | Freq: Once | INTRAVENOUS | Status: AC | PRN
  Administered 2017-03-19: 500 [IU]
  Filled 2017-03-19: qty 5

## 2017-03-19 MED ORDER — DIPHENHYDRAMINE HCL 25 MG PO CAPS
50.0000 mg | ORAL_CAPSULE | Freq: Once | ORAL | Status: AC
  Administered 2017-03-19: 50 mg via ORAL

## 2017-03-19 MED ORDER — PALONOSETRON HCL INJECTION 0.25 MG/5ML
INTRAVENOUS | Status: AC
  Filled 2017-03-19: qty 5

## 2017-03-19 MED ORDER — SODIUM CHLORIDE 0.9% FLUSH
10.0000 mL | INTRAVENOUS | Status: DC | PRN
  Administered 2017-03-19: 10 mL
  Filled 2017-03-19: qty 10

## 2017-03-19 MED ORDER — ACETAMINOPHEN 325 MG PO TABS
650.0000 mg | ORAL_TABLET | Freq: Once | ORAL | Status: AC
  Administered 2017-03-19: 650 mg via ORAL

## 2017-03-19 NOTE — Patient Instructions (Signed)
[facility] Discharge Instructions for Patients Receiving Chemotherapy  Today you received the following chemotherapy agents:  Rituxan and Bendamustine  To help prevent nausea and vomiting after your treatment, we encourage you to take your nausea medication as ordered per MD.   If you develop nausea and vomiting that is not controlled by your nausea medication, call the clinic.   BELOW ARE SYMPTOMS THAT SHOULD BE REPORTED IMMEDIATELY:  *FEVER GREATER THAN 100.5 F  *CHILLS WITH OR WITHOUT FEVER  NAUSEA AND VOMITING THAT IS NOT CONTROLLED WITH YOUR NAUSEA MEDICATION  *UNUSUAL SHORTNESS OF BREATH  *UNUSUAL BRUISING OR BLEEDING  TENDERNESS IN MOUTH AND THROAT WITH OR WITHOUT PRESENCE OF ULCERS  *URINARY PROBLEMS  *BOWEL PROBLEMS  UNUSUAL RASH Items with * indicate a potential emergency and should be followed up as soon as possible.  Feel free to call the clinic you have any questions or concerns. The clinic phone number is [phone].  Please show the [place] at check-in to the Emergency Department and triage nurse.

## 2017-03-19 NOTE — Telephone Encounter (Signed)
Added infusion 6/13 per 6/11 schedule message. Patient will get new printout in infusion today.

## 2017-03-19 NOTE — Progress Notes (Signed)
OK to treat today despite PET scan not complete at this time per L. O'Flaherty RN per Dr. Irene Limbo.

## 2017-03-20 ENCOUNTER — Ambulatory Visit (HOSPITAL_BASED_OUTPATIENT_CLINIC_OR_DEPARTMENT_OTHER): Payer: Medicare Other

## 2017-03-20 VITALS — BP 119/70 | HR 84 | Temp 98.4°F | Resp 18

## 2017-03-20 DIAGNOSIS — C8318 Mantle cell lymphoma, lymph nodes of multiple sites: Secondary | ICD-10-CM

## 2017-03-20 DIAGNOSIS — Z5111 Encounter for antineoplastic chemotherapy: Secondary | ICD-10-CM

## 2017-03-20 DIAGNOSIS — Z7189 Other specified counseling: Secondary | ICD-10-CM

## 2017-03-20 MED ORDER — SODIUM CHLORIDE 0.9 % IV SOLN
Freq: Once | INTRAVENOUS | Status: AC
  Administered 2017-03-20: 14:00:00 via INTRAVENOUS

## 2017-03-20 MED ORDER — SODIUM CHLORIDE 0.9 % IV SOLN
90.0000 mg/m2 | Freq: Once | INTRAVENOUS | Status: AC
  Administered 2017-03-20: 200 mg via INTRAVENOUS
  Filled 2017-03-20: qty 8

## 2017-03-20 MED ORDER — DEXAMETHASONE SODIUM PHOSPHATE 10 MG/ML IJ SOLN
INTRAMUSCULAR | Status: AC
  Filled 2017-03-20: qty 1

## 2017-03-20 MED ORDER — HEPARIN SOD (PORK) LOCK FLUSH 100 UNIT/ML IV SOLN
500.0000 [IU] | Freq: Once | INTRAVENOUS | Status: AC | PRN
  Administered 2017-03-20: 500 [IU]
  Filled 2017-03-20: qty 5

## 2017-03-20 MED ORDER — SODIUM CHLORIDE 0.9% FLUSH
10.0000 mL | INTRAVENOUS | Status: DC | PRN
  Administered 2017-03-20: 10 mL
  Filled 2017-03-20: qty 10

## 2017-03-20 MED ORDER — DEXAMETHASONE SODIUM PHOSPHATE 10 MG/ML IJ SOLN
10.0000 mg | Freq: Once | INTRAMUSCULAR | Status: AC
  Administered 2017-03-20: 10 mg via INTRAVENOUS

## 2017-03-20 NOTE — Patient Instructions (Signed)
[facility] Discharge Instructions for Patients Receiving Chemotherapy  Today you received the following chemotherapy agents bendeka  To help prevent nausea and vomiting after your treatment, we encourage you to take your nausea medication as needed   If you develop nausea and vomiting that is not controlled by your nausea medication, call the clinic.   BELOW ARE SYMPTOMS THAT SHOULD BE REPORTED IMMEDIATELY:  *FEVER GREATER THAN 100.5 F  *CHILLS WITH OR WITHOUT FEVER  NAUSEA AND VOMITING THAT IS NOT CONTROLLED WITH YOUR NAUSEA MEDICATION  *UNUSUAL SHORTNESS OF BREATH  *UNUSUAL BRUISING OR BLEEDING  TENDERNESS IN MOUTH AND THROAT WITH OR WITHOUT PRESENCE OF ULCERS  *URINARY PROBLEMS  *BOWEL PROBLEMS  UNUSUAL RASH Items with * indicate a potential emergency and should be followed up as soon as possible.  Feel free to call the clinic you have any questions or concerns. The clinic phone number is [phone].  Please show the CHEMO ALERT CARD at check-in to the Emergency Department and triage nurse.   

## 2017-03-21 ENCOUNTER — Telehealth: Payer: Self-pay

## 2017-03-21 NOTE — Telephone Encounter (Signed)
Chemo f/u phone call. No pt response.

## 2017-03-25 ENCOUNTER — Encounter (HOSPITAL_COMMUNITY)
Admission: RE | Admit: 2017-03-25 | Discharge: 2017-03-25 | Disposition: A | Discharge status: Home / Self Care | Payer: Medicare Other | Source: Ambulatory Visit | Attending: Hematology | Admitting: Hematology

## 2017-03-25 DIAGNOSIS — C8318 Mantle cell lymphoma, lymph nodes of multiple sites: Secondary | ICD-10-CM | POA: Insufficient documentation

## 2017-03-25 DIAGNOSIS — C7989 Secondary malignant neoplasm of other specified sites: Secondary | ICD-10-CM | POA: Diagnosis not present

## 2017-03-25 DIAGNOSIS — C801 Malignant (primary) neoplasm, unspecified: Secondary | ICD-10-CM | POA: Diagnosis not present

## 2017-03-25 LAB — GLUCOSE, CAPILLARY: GLUCOSE-CAPILLARY: 134 mg/dL — AB (ref 65–99)

## 2017-03-25 MED ORDER — FLUDEOXYGLUCOSE F - 18 (FDG) INJECTION
11.3400 | Freq: Once | INTRAVENOUS | Status: AC | PRN
  Administered 2017-03-25: 11.34 via INTRAVENOUS

## 2017-03-26 ENCOUNTER — Ambulatory Visit (HOSPITAL_BASED_OUTPATIENT_CLINIC_OR_DEPARTMENT_OTHER): Payer: Medicare Other | Admitting: Hematology

## 2017-03-26 ENCOUNTER — Telehealth: Payer: Self-pay | Admitting: Hematology

## 2017-03-26 ENCOUNTER — Other Ambulatory Visit (HOSPITAL_BASED_OUTPATIENT_CLINIC_OR_DEPARTMENT_OTHER): Payer: Medicare Other

## 2017-03-26 ENCOUNTER — Encounter: Payer: Self-pay | Admitting: Hematology

## 2017-03-26 VITALS — BP 128/73 | HR 82 | Temp 97.8°F | Resp 18 | Ht 70.0 in | Wt 198.2 lb

## 2017-03-26 DIAGNOSIS — C8318 Mantle cell lymphoma, lymph nodes of multiple sites: Secondary | ICD-10-CM | POA: Diagnosis not present

## 2017-03-26 DIAGNOSIS — H5352 Acquired color vision deficiency: Secondary | ICD-10-CM | POA: Diagnosis not present

## 2017-03-26 DIAGNOSIS — R197 Diarrhea, unspecified: Secondary | ICD-10-CM

## 2017-03-26 DIAGNOSIS — Z7189 Other specified counseling: Secondary | ICD-10-CM

## 2017-03-26 DIAGNOSIS — K639 Disease of intestine, unspecified: Secondary | ICD-10-CM

## 2017-03-26 DIAGNOSIS — K6389 Other specified diseases of intestine: Secondary | ICD-10-CM

## 2017-03-26 DIAGNOSIS — Z720 Tobacco use: Secondary | ICD-10-CM

## 2017-03-26 LAB — COMPREHENSIVE METABOLIC PANEL
ALBUMIN: 3.2 g/dL — AB (ref 3.5–5.0)
ALT: 19 U/L (ref 0–55)
AST: 10 U/L (ref 5–34)
Alkaline Phosphatase: 84 U/L (ref 40–150)
Anion Gap: 9 mEq/L (ref 3–11)
BUN: 13.2 mg/dL (ref 7.0–26.0)
CALCIUM: 9.3 mg/dL (ref 8.4–10.4)
CHLORIDE: 104 meq/L (ref 98–109)
CO2: 28 mEq/L (ref 22–29)
Creatinine: 0.9 mg/dL (ref 0.7–1.3)
EGFR: >90 mL/min/{1.73_m2} (ref >90)
GLUCOSE: 183 mg/dL — AB (ref 70–140)
Potassium: 3.5 mEq/L (ref 3.5–5.1)
SODIUM: 141 meq/L (ref 136–145)
Total Bilirubin: 0.46 mg/dL (ref 0.20–1.20)
Total Protein: 6.2 g/dL — ABNORMAL LOW (ref 6.4–8.3)

## 2017-03-26 LAB — CBC WITH DIFFERENTIAL/PLATELET
BASO%: 0.5 % (ref 0.0–2.0)
Basophils Absolute: 0 10*3/uL (ref 0.0–0.1)
EOS%: 9.6 % — AB (ref 0.0–7.0)
Eosinophils Absolute: 0.9 10*3/uL — ABNORMAL HIGH (ref 0.0–0.5)
HEMATOCRIT: 42 % (ref 38.4–49.9)
HGB: 13.4 g/dL (ref 13.0–17.1)
LYMPH#: 0.2 10*3/uL — AB (ref 0.9–3.3)
LYMPH%: 1.8 % — ABNORMAL LOW (ref 14.0–49.0)
MCH: 28 pg (ref 27.2–33.4)
MCHC: 32 g/dL (ref 32.0–36.0)
MCV: 87.6 fL (ref 79.3–98.0)
MONO#: 0.9 10*3/uL (ref 0.1–0.9)
MONO%: 9.9 % (ref 0.0–14.0)
NEUT#: 7.4 10*3/uL — ABNORMAL HIGH (ref 1.5–6.5)
NEUT%: 78.2 % — ABNORMAL HIGH (ref 39.0–75.0)
Platelets: 232 10*3/uL (ref 140–400)
RBC: 4.79 10*6/uL (ref 4.20–5.82)
RDW: 14.6 % (ref 11.0–14.6)
WBC: 9.5 10*3/uL (ref 4.0–10.3)

## 2017-03-26 NOTE — Telephone Encounter (Signed)
Scheduled appt per 6/19 los - sent reminder letter in the mail/ left message with appt date and time.

## 2017-03-26 NOTE — Patient Instructions (Signed)
Thank you for choosing [facility] to provide your oncology and hematology care.  To afford each patient quality time with our providers, please arrive 30 minutes before your scheduled appointment time.  If you arrive late for your appointment, you may be asked to reschedule.  We strive to give you quality time with our providers, and arriving late affects you and other patients whose appointments are after yours.  If you are a no show for multiple scheduled visits, you may be dismissed from the clinic at the providers discretion.   Again, thank you for choosing [facility], our hope is that these requests will decrease the amount of time that you wait before being seen by our physicians.  ______________________________________________________________________ Should you have questions after your visit to the [facility], please contact our office at [phone] between the hours of 8:30 and 4:30 p.m.    Voicemails left after 4:30p.m will not be returned until the following business day.   For prescription refill requests, please have your pharmacy contact us directly.  Please also try to allow 48 hours for prescription requests.   Please contact the scheduling department for questions regarding scheduling.  For scheduling of procedures such as PET scans, CT scans, MRI, Ultrasound, etc please contact central scheduling at [phone].   Resources For Cancer Patients and Caregivers:  American Cancer Society:  800-227-2345  Can help patients locate various types of support and financial assistance Cancer Care: 1-800-813-HOPE (4673) Provides financial assistance, online support groups, medication/co-pay assistance.   [place] DSS:  336-641-3447 Where to apply for food stamps, Medicaid, and utility assistance Medicare Rights Center: 800-333-4114 Helps people with Medicare understand their rights and benefits, navigate the Medicare system, and secure the  quality healthcare they deserve SCAT: 336-333-6589 [place] [employer]'s shared-ride transportation service for eligible riders who have a disability that prevents them from riding the fixed route bus.   For additional information on assistance programs please contact our social worker:   Grier Hock/Abigail Elmore:  [phone] 

## 2017-03-26 NOTE — Progress Notes (Signed)
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 Patient chooses not to pursue AutoHSCT based strategies which is quite reasonable considering his functional challenges with legal blindness and limited social support.   HISTORY OF PRESENTING ILLNESS: plz see clinic note for details on initial presentation  Interval History  Kristopher Johnson is here for follow-up of his mantle cell lymphoma. He tolerated his first cycle of bendamustine and Rituxan without any overt prohibitive toxicities. He had a recent PET/CT scan on 03/25/2017 to serve as a new pretreatment baseline.  No fevers no chills no night sweats. Has been having loose stools and was recommended to hold his MiraLAX. Daughter notes that his GI bleeding has resolved and that his rectal discomfort is better.     PAST MEDICAL HISTORY:  Past Medical History:  Diagnosis Date  . Abscess of arm, right 10/27/2015  . Lymphoma of lymph nodes of multiple sites ([place]) 10/27/2015  . 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 Shortness of breath 10/27/2015    SURGICAL HISTORY: Past Surgical History:  Procedure Laterality Date  . Cataract surgery     Patient notes she has had bilateral Surgery in 1998 and 99  . FLEXIBLE SIGMOIDOSCOPY N/A 03/01/2017   Procedure: FLEXIBLE SIGMOIDOSCOPY;  Surgeon: Manus Gunning, MD;  Location: [facility];  Service: Gastroenterology;  Laterality: N/A;  . TONSILLECTOMY      about 61years old    SOCIAL HISTORY: Social History   Social History  . Marital status: Widowed    Spouse name: N/A  . Number of children: N/A  . Years of education: N/A   Occupational History  . Not on file.   Social History Main Topics  . Smoking status: Current Every Day Smoker    Packs/day: 1.00    Years: 33.00  . Smokeless tobacco: Never Used  . Alcohol use No  . Drug use: No  . Sexual activity: Not on file     Comment: Disabled, retinis pigmentosa legally blind, 4children MPOA Lynnn(cousin)   Other Topics Concern  . Not on file   Social History Narrative  . No narrative on file    FAMILY HISTORY: Family History  Problem Relation Age of Onset  . Colon cancer Neg Hx     ALLERGIES:  has No Known Allergies.  MEDICATIONS:  Current Outpatient Prescriptions  Medication Sig Dispense Refill  . acyclovir (ZOVIRAX) 400 MG tablet Take 1 tablet (400 mg total) by mouth daily. 30 tablet 3  . allopurinol (ZYLOPRIM) 100 MG tablet Take 1 tablet (100 mg total) by mouth 2 (two) times daily. 60 tablet 0  . dexamethasone (DECADRON) 4 MG tablet Take 2 tablets (8 mg total) by mouth daily. Start the day after bendamustine chemotherapy for 2 days. Take with food. 30 tablet 1  . lidocaine-prilocaine (EMLA) cream Apply 1 application topically as needed. (Patient not taking: Reported on 11/06/2016) 30 g 6   No current facility-administered medications for this visit.  REVIEW OF SYSTEMS:    10 Point review of Systems was done is negative except as noted above.  PHYSICAL EXAMINATION: ECOG PERFORMANCE STATUS: 2 - Symptomatic, <50% confined to bed .BP 128/73 (BP Location: Left Arm, Patient Position: Sitting)   Pulse 82   Temp 97.8 F (36.6 C) (Oral)   Resp 18   Ht 5\' 10"  (1.778 m)   Wt 198 lb 3.2 oz (89.9 kg)   SpO2 95%   BMI 28.44 kg/m   GENERAL:alert, in no acute distress and comfortable SKIN: some nodular skin lesions over extremities and back, beginning crusted  lesions over his right scapular area. EYES: normal, conjunctiva are pink and non-injected, sclera clear. Poor visual acuity - some directional light perception but difficulty with finger counting. OROPHARYNX:no exudate, no erythema and lips, multiple carious teeth with significant periodontal disease. NECK: supple, no JVD, thyroid normal size, non-tender, without nodularity LYMPH:  no palpable lymphadenopathy in the cervical, axillary or inguinal LUNGS: clear to auscultation with normal respiratory effort HEART: regular rate & rhythm,  no murmurs and no lower extremity edema ABDOMEN: abdomen soft, non-tender, normoactive bowel sounds  Musculoskeletal: no cyanosis of digits and no clubbing  PSYCH: alert & oriented x 3 with fluent speech NEURO: no focal motor/sensory deficits  LABORATORY DATA:  I have reviewed the data as listed   CBC Latest Ref Rng & Units 03/26/2017 03/19/2017 03/07/2017  WBC 4.0 - 10.3 10e3/uL 9.5 11.9(H) 10.5(H)  Hemoglobin 13.0 - 17.1 g/dL 13.4 13.2 13.6  Hematocrit 38.4 - 49.9 % 42.0 42.2 43.0  Platelets 140 - 400 10e3/uL 232 249 236    . CMP Latest Ref Rng & Units 03/26/2017 03/19/2017 03/07/2017  Glucose 70 - 140 mg/dl 183(H) 132 183(H)  BUN 7.0 - 26.0 mg/dL 13.2 11.5 9.1  Creatinine 0.7 - 1.3 mg/dL 0.9 0.9 1.0  Sodium 136 - 145 mEq/L 141 143 141  Potassium 3.5 - 5.1 mEq/L 3.5 3.9 3.8  Chloride 101 - 111 mmol/L - - -  CO2 22 - 29 mEq/L 28 27 28   Calcium 8.4 - 10.4 mg/dL 9.3 9.6 9.5  Total Protein 6.4 - 8.3 g/dL 6.2(L) 6.6 6.5  Total Bilirubin 0.20 - 1.20 mg/dL 0.46 0.35 0.34  Alkaline Phos 40 - 150 U/L 84 84 86  AST 5 - 34 U/L 10 14 11   ALT 0 - 55 U/L 19 17 11     Lab Results  Component Value Date   LDH 200 03/07/2017     RADIOGRAPHIC STUDIES: I have personally reviewed the radiological images as listed and agreed with the findings in the report. Ct Abdomen Pelvis W Contrast  Result Date: 02/28/2017 CLINICAL DATA:  Blood in bowel movements EXAM: CT  ABDOMEN AND PELVIS WITH CONTRAST TECHNIQUE: Multidetector CT imaging of the abdomen and pelvis was performed using the standard protocol following bolus administration of intravenous contrast. CONTRAST:  135mL ISOVUE-300 IOPAMIDOL (ISOVUE-300) INJECTION 61% COMPARISON:  10/24/2015, PET CT 6 9,017 FINDINGS: Lower chest: Lung bases demonstrate no pleural effusion. Interim development of multiple pulmonary nodules, for example right posterior lower lobe pulmonary nodule measuring 9 mm, series 4, image number 53. Right lateral subpleural pulmonary nodule measuring 13 mm, series 4, image number 52. Multiple pleural-based masses along the lingula and left lower lobe. Normal heart size. Hepatobiliary: Stable left hepatic lobe cyst. No calcified gallstones or biliary dilatation. Multiple nodules studding the diaphragmatic surface, for example series 2, image number 21 Pancreas: Unremarkable. No pancreatic ductal dilatation or surrounding inflammatory changes. Spleen: Normal in  size without focal abnormality. Adrenals/Urinary Tract: Diffuse nodularity of the left adrenal gland which is unchanged. Right adrenal gland normal. No hydronephrosis. Small hypodensities at the left renal pelvis do not appear changed compared to prior PET-CT and may reflect small parapelvic cysts. Bladder normal. Stomach/Bowel: The stomach is nonenlarged. There is no evidence for a bowel obstruction. Interim finding of large intraluminal mass within the rectosigmoid colon, measuring approximately 9.1 x 4.4 cm. Appendix is normal. Vascular/Lymphatic: Non aneurysmal aorta. Diffuse adenopathy has developed. 2.8 x 1.9 cm mass with adjacent nodules in the right posterior cardiophrenic space. Mesenteric nodule, anterior to the duodenum measuring 15 mm, series 2, image number 44. Soft tissue mass posterior to the distal descending thoracic aorta measuring 2.7 cm. Multiple pelvic nodules. A left pelvic sidewall lymph node measures 1.6 cm, series 2, image  number 69. 2.7 cm perirectal mass, series 2, image number 63. Reproductive: Prostate is unremarkable. Other: Bilateral bowel containing inguinal hernias. Development of multiple soft tissue nodules within the subcutaneous fat of the abdominopelvic wall, for example right parasagittal anterior nodule measuring 9 mm, series 2, image number 31, right lower 9 mm soft tissue nodule, series 2, image number 38, and left posterior gluteal region nodule measuring 13 mm, series 2, image number 58. Musculoskeletal: No acute or suspicious bone lesion is visualized. IMPRESSION: 1. Large intraluminal mass within the rectosigmoid colon, new since prior CT. Further evaluation with colonoscopy is recommended. Development of multiple pulmonary pulmonary nodules and pleural based masses suspicious for metastatic disease. Development of diaphragmatic, cardio phrenic, retroperitoneal, mesenteric, and pelvic masses also suspicious for metastatic disease. Development of multiple subcutaneous soft tissue nodules within the abdomen and pelvis. Uncertain if the constellation of findings represents recurrence of patient's lymphoma or new primary in the colon with metastatic disease. 2. Large bowel containing inguinal hernias with no evidence for obstruction. Electronically Signed   By: Donavan Foil M.D.   On: 02/28/2017 22:50   Nm Pet Image Restag (ps) Skull Base To Thigh  Result Date: 03/25/2017 CLINICAL DATA:  Subsequent treatment strategy for mantle cell lymphoma. EXAM: NUCLEAR MEDICINE PET SKULL BASE TO THIGH TECHNIQUE: 11.3 mCi F-18 FDG was injected intravenously. Full-ring PET imaging was performed from the skull base to thigh after the radiotracer. CT data was obtained and used for attenuation correction and anatomic localization. FASTING BLOOD GLUCOSE:  Value: 134 mg/dl COMPARISON:  PET-CT 03/16/2016, 11/14/2015 FINDINGS: NECK No hypermetabolic nodes in the neck. CHEST New small hypermetabolic RIGHT axial lymph nodes and sub  pectoralis lymph node. Large LEFT internal mammary lymph node with SUV max equal 10.8. This node measures 20 mm image 57, series 4. enlarged intensely hypermetabolic subpleural nodule in the LEFT upper lobe with SUV max equal 15.8. This nodule measures 3.2 cm (image 69, series 4). Multiple additional nodules in the pleural space on the LEFT. Hypermetabolic pleural thickening within the RIGHT hemithorax to lesser degree. Intensely hypermetabolic pretracheal lymph nodes are new from prior with SUV max equal 16.5. ABDOMEN/PELVIS New focal activity along the RIGHT margin of the liver is favored peritoneal metastasis. Intense metabolic activity associated with nodules in the deep pelvis adjacent the sigmoid colon measuring 3 cm (image 165, series 4) with SUV max equal 19. Intense hypermetabolic thickening within the rectum with SUV max equal 26. Hypermetabolic LEFT external iliac lymph nodes and LEFT inguinal lymph nodes are intense. LEFT inguinal node with SUV max equal 17. Large fat filled inguinal hernias again demonstrated. SKELETON No focal hypermetabolic activity to suggest skeletal metastasis. Soft  tissue metastasis in the muscle adjacent to the proximal LEFT femur with SUV max equal 11.6 IMPRESSION: 1. Unfortunately there is widespread recurrence of high-grade lymphoma. Lesions are new from PET-CT scan of 03/16/2016 and a similar to metastatic pattern of PET-CT 11/14/2015. 2. Nodular thickening within LEFT pleural space with intense metabolic activity. New mediastinal nodal metastasis. Large hypermetabolic LEFT internal mammary node. 3. Several foci of peritoneal metastasis in the abdomen. Large nodule adjacent to the rectosigmoid colon. 4. Intense hypermetabolic wall thickening of the sigmoid colon consistent metastasis. 5. Hypermetabolic LEFT external and inguinal metastatic nodes. 6. Soft tissue metastasis to the musculature anterior the LEFT proximal femur. Electronically Signed   By: Suzy Bouchard M.D.    On: 03/25/2017 17:06    ECHO: 11/01/2015: Study Conclusions  - Left ventricle: The cavity size was normal. There was mild concentric hypertrophy. Systolic function was normal. The estimated ejection fraction was in the range of 50% to 55%. Wall motion was normal; there were no regional wall motion abnormalities. Doppler parameters are consistent with abnormal left ventricular relaxation (grade 1 diastolic dysfunction). - Aortic valve: Transvalvular velocity was within the normal range. There was no stenosis. There was no regurgitation. - Mitral valve: There was no regurgitation. - Right ventricle: The cavity size was normal. Wall thickness was normal. Systolic function was normal. - Tricuspid valve: There was no regurgitation. - Inferior vena cava: The vessel was normal in size. The respirophasic diameter changes were in the normal range (>= 50%), consistent with normal central venous pressure.Study Conclusions  - Left ventricle: The cavity size was normal. There was mild concentric hypertrophy. Systolic function was normal. The estimated ejection fraction was in the range of 50% to 55%. Wall motion was normal; there were no regional wall motion abnormalities. Doppler parameters are consistent with abnormal left ventricular relaxation (grade 1 diastolic dysfunction). - Aortic valve: Transvalvular velocity was within the normal range. There was no stenosis. There was no regurgitation. - Mitral valve: There was no regurgitation. - Right ventricle: The cavity size was normal. Wall thickness was normal. Systolic function was normal. - Tricuspid valve: There was no regurgitation. - Inferior vena cava: The vessel was normal in size. The respirophasic diameter changes were in the normal range (>= 50%), consistent with normal central venous pressure.    ASSESSMENT & PLAN:    61 yo man with   1) h/o previous Stage IVB E Mantle Cell lymphoma with  likely gastric involvement, extensive LNadenopathy. ECHO nl EF ECOG PS 2 but activities significantly limited due to being legally blind HIV/Hep C/Hep B neg Baseline LDH 450--->135-->163  PET/CT scan after 3 cycles of R CHOP show good overall response. Stomach lesion still with very FDG uptake ? Residual lymphoma versus inflammation.  Patient is status post 6 cycles of R CHOP. PET/CT after 6 cycles shows resolution of hypermetabolic lymphadenopathy and gastric wall activity . Patient was on maintenance Rituxan for 1 year now prior to relapse.  2) Relapsed Pleomorphic mantle cell lymphoma Presented with rectal bleeding and constipation with a large rectosigmoid mass and other evidence of colonic involvement. CT chest abdomen pelvis showed multiple areas of lymphadenopathy in the abdomen as well as in the chest suggesting significant recurrence. PET/CT scan shows diffuse involvement with mantle cell lymphoma involving the chest abdomen pelvis and bowels.  PLAN -Patient's case was discussed in the hematology report. Review of pathology is consistent with pleomorphic mantle cell lymphoma which tends to have an aggressive course. This explains why he recurred within  about a year of finishing R CHOP. -He has been started on second line bendamustine and Rituxan chemotherapy and received his first cycle on 03/19/2017 with no prohibitive toxicities at this time. -PET/CT scan results were reviewed in detail with the patient and his daughter and will serve as a baseline for following treatment response. -Patient will follow up for second cycle of chemotherapy as per plan. We will need to be scheduled with NP/Dr. Lebron Conners. For his second cycle of treatment since I will be on PAL. - If he  tolerates 90 mg/m of Bendamustine might increase to 120 mg meter square possibly with Neulasta support and will consider increasing Rituxan to 500 mg/m2 since he did progress on Rituxan. (with 3rd cycle of treatment) -After  treatment might need to consider maintenance Ibrutinib. - I shall see him back prior to his 3rd cycle of treatment -will plan to rpt PET/CT prior to 4th cycle of treatment.  2) Retinitis pigmentosa causing legal blindness -  is seeing a retina specialist in town. No acute interventions at this time. Notes that his vision is progressively getting worse. His daughter and granddaughter are helping him out at home. He was encouraged to use some of the resources for the blind or through the retinitis pigmentosa Foundation to try   3) Weight loss and protein calorie malnutrition. Resolved. Weight has improved significantly since his diagnosis and back to his baseline weight. Lost about 5-6 lbs in the last 2 months. Wt Readings from Last 3 Encounters:  03/26/17 198 lb 3.2 oz (89.9 kg)  03/07/17 203 lb (92.1 kg)  03/01/17 201 lb 8 oz (91.4 kg)   4) h/o Peri-orbital dermatitis /Conjuncitivitis - patient was seen by the dermatologist and given topical desonide ointment which led to the resolution of his dermatitis. There were not sure about the etiology but noted then to could not rule out Rituxan is a possibility. Currently his dermatitis has resolved and his conjunctivitis has resolved. He did not have rash anywhere else at the time. No oral sores or other toxicities. This happened about 4 days after Rituxan another chemotherapy. This has completely resolved and patient has not had an further issues with this with several additional doses of Rituxan.  5) Diarrhea - he was constipated due to his rectosigmoid mass and how has been having some loose stools. This could be from bowel involvement from his mantle cell lymphoma. Plan -Hold laxatives -If diarrhea persists would sent for C. difficile and GI stool panel  6) Patient Active Problem List   Diagnosis Date Noted  . Counseling regarding advanced care planning and goals of care 03/13/2017  . Rectal mass 03/02/2017  . Colon cancer metastasized to  liver and lung([facility]) 03/02/2017  . Rectal bleeding 02/28/2017  . Portacath in place 01/30/2016  . Skin lesion of chest wall 12/19/2015  . Shingles 12/19/2015  . Rash 11/28/2015  . 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 Mantle cell lymphoma of lymph nodes of multiple regions ([place]) 11/01/2015  . Protein-calorie malnutrition ([place]) 11/01/2015  . Abscess of arm, right 10/27/2015  . Lymphoma of lymph nodes of multiple sites ([place]) 10/27/2015  . Shortness of breath 10/27/2015  . Shortness of breath dyspnea 10/27/2015  . Hypoalbuminemia 10/25/2015  . Elevated lipase 10/25/2015  . Inguinal hernia 10/25/2015  . Urinary retention 10/25/2015  . Abdominal pain   . Lymphadenopathy 10/24/2015   -continue f/u with PCP for other continued medical issues.  Lab today fr stool studies Continue chemotherapy as per schedule - plz  schedule out 2 additional cycles RTC with Dr Perlov/NP with next cycle (C2D1) if I am on PAL with labs RTC with Dr Irene Limbo with C3D1 with labs  I spent 30 minutes counseling the patient face to face. The total time spent in the appointment was 40 minutes and more than 50% was on counseling and direct patient cares. Patient was accompanied by his son and daughter for this visit .    Sullivan Lone MD [place] AAHIVMS [facility] Hematology/Oncology Physician [facility]  (Office):       [phone] (Work cell):  [phone] (Fax):           [phone]

## 2017-04-16 ENCOUNTER — Telehealth: Payer: Self-pay | Admitting: Hematology and Oncology

## 2017-04-16 ENCOUNTER — Ambulatory Visit (HOSPITAL_BASED_OUTPATIENT_CLINIC_OR_DEPARTMENT_OTHER): Payer: Medicare Other | Admitting: Hematology and Oncology

## 2017-04-16 ENCOUNTER — Ambulatory Visit (HOSPITAL_BASED_OUTPATIENT_CLINIC_OR_DEPARTMENT_OTHER): Payer: Medicare Other

## 2017-04-16 ENCOUNTER — Other Ambulatory Visit (HOSPITAL_BASED_OUTPATIENT_CLINIC_OR_DEPARTMENT_OTHER): Payer: Medicare Other

## 2017-04-16 ENCOUNTER — Encounter: Payer: Self-pay | Admitting: Hematology and Oncology

## 2017-04-16 VITALS — BP 111/76 | HR 71 | Temp 97.9°F | Resp 18 | Ht 70.0 in | Wt 198.0 lb

## 2017-04-16 VITALS — BP 120/62 | HR 74 | Temp 98.8°F | Resp 17

## 2017-04-16 DIAGNOSIS — Z7189 Other specified counseling: Secondary | ICD-10-CM

## 2017-04-16 DIAGNOSIS — C8318 Mantle cell lymphoma, lymph nodes of multiple sites: Secondary | ICD-10-CM

## 2017-04-16 DIAGNOSIS — Z720 Tobacco use: Secondary | ICD-10-CM

## 2017-04-16 DIAGNOSIS — Z5112 Encounter for antineoplastic immunotherapy: Secondary | ICD-10-CM | POA: Diagnosis present

## 2017-04-16 DIAGNOSIS — Z5111 Encounter for antineoplastic chemotherapy: Secondary | ICD-10-CM

## 2017-04-16 LAB — COMPREHENSIVE METABOLIC PANEL
ALT: 17 U/L (ref 0–55)
AST: 11 U/L (ref 5–34)
Albumin: 3.6 g/dL (ref 3.5–5.0)
Alkaline Phosphatase: 92 U/L (ref 40–150)
Anion Gap: 7 mEq/L (ref 3–11)
BILIRUBIN TOTAL: 0.34 mg/dL (ref 0.20–1.20)
BUN: 11.4 mg/dL (ref 7.0–26.0)
CALCIUM: 9.7 mg/dL (ref 8.4–10.4)
CO2: 30 mEq/L — ABNORMAL HIGH (ref 22–29)
CREATININE: 0.9 mg/dL (ref 0.7–1.3)
Chloride: 104 mEq/L (ref 98–109)
Glucose: 166 mg/dl — ABNORMAL HIGH (ref 70–140)
Potassium: 3.9 mEq/L (ref 3.5–5.1)
Sodium: 140 mEq/L (ref 136–145)
TOTAL PROTEIN: 6.4 g/dL (ref 6.4–8.3)

## 2017-04-16 LAB — CBC WITH DIFFERENTIAL/PLATELET
BASO%: 1 % (ref 0.0–2.0)
Basophils Absolute: 0.1 10*3/uL (ref 0.0–0.1)
EOS ABS: 0.9 10*3/uL — AB (ref 0.0–0.5)
EOS%: 11.2 % — ABNORMAL HIGH (ref 0.0–7.0)
HEMATOCRIT: 44.1 % (ref 38.4–49.9)
HGB: 14.6 g/dL (ref 13.0–17.1)
LYMPH#: 1.1 10*3/uL (ref 0.9–3.3)
LYMPH%: 13.1 % — AB (ref 14.0–49.0)
MCH: 30 pg (ref 27.2–33.4)
MCHC: 33.1 g/dL (ref 32.0–36.0)
MCV: 90.7 fL (ref 79.3–98.0)
MONO#: 0.5 10*3/uL (ref 0.1–0.9)
MONO%: 5.6 % (ref 0.0–14.0)
NEUT%: 69.1 % (ref 39.0–75.0)
NEUTROS ABS: 5.8 10*3/uL (ref 1.5–6.5)
PLATELETS: 142 10*3/uL (ref 140–400)
RBC: 4.86 10*6/uL (ref 4.20–5.82)
RDW: 15.7 % — ABNORMAL HIGH (ref 11.0–14.6)
WBC: 8.4 10*3/uL (ref 4.0–10.3)

## 2017-04-16 MED ORDER — ACETAMINOPHEN 325 MG PO TABS
650.0000 mg | ORAL_TABLET | Freq: Once | ORAL | Status: AC
Start: 2017-04-16 — End: 2017-04-16
  Administered 2017-04-16: 650 mg via ORAL

## 2017-04-16 MED ORDER — DEXAMETHASONE SODIUM PHOSPHATE 10 MG/ML IJ SOLN
10.0000 mg | Freq: Once | INTRAMUSCULAR | Status: AC
  Administered 2017-04-16: 10 mg via INTRAVENOUS

## 2017-04-16 MED ORDER — DIPHENHYDRAMINE HCL 25 MG PO CAPS
50.0000 mg | ORAL_CAPSULE | Freq: Once | ORAL | Status: AC
  Administered 2017-04-16: 50 mg via ORAL

## 2017-04-16 MED ORDER — ACETAMINOPHEN 325 MG PO TABS
ORAL_TABLET | ORAL | Status: AC
  Filled 2017-04-16: qty 2

## 2017-04-16 MED ORDER — DIPHENHYDRAMINE HCL 25 MG PO CAPS
ORAL_CAPSULE | ORAL | Status: AC
  Filled 2017-04-16: qty 1

## 2017-04-16 MED ORDER — DIPHENHYDRAMINE HCL 25 MG PO CAPS
ORAL_CAPSULE | ORAL | Status: AC
  Filled 2017-04-16: qty 2

## 2017-04-16 MED ORDER — PALONOSETRON HCL INJECTION 0.25 MG/5ML
0.2500 mg | Freq: Once | INTRAVENOUS | Status: AC
  Administered 2017-04-16: 0.25 mg via INTRAVENOUS

## 2017-04-16 MED ORDER — SODIUM CHLORIDE 0.9 % IV SOLN
375.0000 mg/m2 | Freq: Once | INTRAVENOUS | Status: AC
  Administered 2017-04-16: 800 mg via INTRAVENOUS
  Filled 2017-04-16: qty 50

## 2017-04-16 MED ORDER — SODIUM CHLORIDE 0.9% FLUSH
10.0000 mL | INTRAVENOUS | Status: DC | PRN
  Administered 2017-04-16: 10 mL
  Filled 2017-04-16: qty 10

## 2017-04-16 MED ORDER — HEPARIN SOD (PORK) LOCK FLUSH 100 UNIT/ML IV SOLN
500.0000 [IU] | Freq: Once | INTRAVENOUS | Status: AC | PRN
  Administered 2017-04-16: 500 [IU]
  Filled 2017-04-16: qty 5

## 2017-04-16 MED ORDER — SODIUM CHLORIDE 0.9 % IV SOLN
Freq: Once | INTRAVENOUS | Status: AC
  Administered 2017-04-16: 12:00:00 via INTRAVENOUS

## 2017-04-16 MED ORDER — PALONOSETRON HCL INJECTION 0.25 MG/5ML
INTRAVENOUS | Status: AC
  Filled 2017-04-16: qty 5

## 2017-04-16 MED ORDER — SODIUM CHLORIDE 0.9 % IV SOLN
90.0000 mg/m2 | Freq: Once | INTRAVENOUS | Status: AC
  Administered 2017-04-16: 200 mg via INTRAVENOUS
  Filled 2017-04-16: qty 8

## 2017-04-16 MED ORDER — DEXAMETHASONE SODIUM PHOSPHATE 10 MG/ML IJ SOLN
INTRAMUSCULAR | Status: AC
  Filled 2017-04-16: qty 1

## 2017-04-16 NOTE — Assessment & Plan Note (Signed)
61 year old male with recurrent stage IV mantle cell lymphoma with recurrence complicated by lower GI bleeding due to lymphoma involvement. Patient was started on [place] to systemic chemoimmunotherapy with rituximab and bendamustine on 03/19/17. Following initial cycle of treatment, patient notices dramatic clinical improvement and resolution of the GI bleeding and abdominal discomfort.  Patient has noncompliant with the cycle year and allopurinol prescriptions. I have reviewed the purposes of each of the medications and management of potential side effects of the lymphoma and his therapy. Patient voiced understanding of my explanation. I'm not sure that he will change his mind about the medications though.  Clinical evaluation and lab work up or Mrs. to proceed with a second cycle of therapy. Based on the patient's tolerance and benefits, I'm not changing the doses and patient will continue bendamustine at 90 mg/m on days 1 and 2 with rituximab 375 mg/m on day 1.  Plan: --Proceed with R-Benda Cycle #2 today --RTC with Dr Irene Limbo in 4 weeks for possible 3rd cycle of chemoimmunotherapy --Restaging imaging with PET/CT or CT of the chest/abdomen/pelvis and discretion of the primary treating oncologist.  Voice recognition software was used and creation of this note. Despite my best effort at editing the text, some misspelling/errors may have occurred.

## 2017-04-16 NOTE — Progress Notes (Signed)
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 Patient was started on [place] to systemic chemoimmunotherapy with rituximab and bendamustine on 03/19/17. Following initial cycle of treatment, patient notices dramatic clinical improvement and resolution of the GI bleeding and abdominal discomfort.  Patient has noncompliant with the cycle year and allopurinol prescriptions. I have reviewed the purposes of each of the medications and management of potential side effects of the lymphoma and his therapy. Patient voiced understanding of my explanation. I'm not sure that he will change his mind about the medications though.  Clinical evaluation and lab work up or Mrs. to proceed with a second cycle of therapy. Based on the patient's tolerance and benefits, I'm not changing the doses and patient will continue bendamustine at 90 mg/m on days 1 and 2 with rituximab 375 mg/m on day 1.  Plan: --Proceed with R-Benda Cycle #2 today --RTC with Dr Irene Limbo in 4 weeks for possible 3rd cycle of chemoimmunotherapy --Restaging imaging with PET/CT or CT of the chest/abdomen/pelvis and discretion of the primary treating oncologist.  Voice recognition software was used and creation of this note. Despite my best effort at editing the text, some misspelling/errors may have occurred.   Orders Placed This Encounter  Procedures  . CBC with Differential    Standing Status:   Future    Standing Expiration Date:   04/16/2018  . Uric acid    Standing Status:   Future    Standing Expiration Date:   04/16/2018  . Magnesium    Standing Status:   Future    Standing Expiration Date:   04/16/2018  . Phosphorus    Standing Status:   Future    Standing Expiration Date:   04/16/2018  . Comprehensive metabolic panel     Standing Status:   Future    Standing Expiration Date:   04/16/2018  . Lactate dehydrogenase (LDH)    Standing Status:   Future    Standing Expiration Date:   04/16/2018    Cancer Staging No matching staging information was found for the patient.  All questions were answered.  . The patient knows to call the clinic with any problems, questions or concerns.  This note was electronically signed.    History of Presenting Illness Kristopher Johnson 61 y.o. presenting to the [facility] for Monitoring while receiving palliative systemic chemoimmunotherapy with rituximab and bendamustine for relapsed mantle cell lymphoma, stage IV. Patient is followed by Dr Irene Limbo. He presents today for possible second cycle of his therapy seen by myself in absence of his primary oncologist.  Patient reports feeling remarkably better since initiation of therapy. He notes interval resolution of rectal bleeding and abdominal discomfort. He denies any interval fevers, chills or night sweats. Denies chest pain, shortness of breath or cough. His appetite has improved back to baseline. Patient denies nausea, early satiety, abdominal pain, diarrhea, or constipation. No dysuria or hematuria. No weakness or sensory deficits.  Of note, patient not been taking his acyclovir or his allopurinol that were prescribed to him for management of potential side effects of his current therapy.  Oncological/hematological History:  No history exists.    Medical History: Past Medical History:  Diagnosis Date  . Abscess of arm, right 10/27/2015  . Lymphoma of lymph nodes of multiple sites ([place]) 10/27/2015  . 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 Shortness of breath 10/27/2015    Surgical History: Past Surgical History:  Procedure Laterality Date  . Cataract surgery     Patient notes she has had bilateral Surgery in 1998 and 99  . FLEXIBLE  SIGMOIDOSCOPY N/A 03/01/2017   Procedure: FLEXIBLE SIGMOIDOSCOPY;  Surgeon: Manus Gunning, MD;  Location: [facility];  Service: Gastroenterology;  Laterality: N/A;  . TONSILLECTOMY     about 61years old    Family History: Family History  Problem Relation Age of Onset  . Colon cancer Neg Hx     Social History: Social History   Social History  . Marital status: Widowed    Spouse name: N/A  . Number of children: N/A  . Years of education: N/A   Occupational History  . Not on file.   Social History Main Topics  . Smoking status: Current Every Day Smoker    Packs/day: 1.00    Years: 33.00  . Smokeless tobacco: Never Used  . Alcohol use No  . Drug use: No  . Sexual activity: Not on file     Comment: Disabled, retinis pigmentosa legally blind, 4children MPOA Lynnn(cousin)   Other Topics Concern  . Not on file   Social History Narrative  . No narrative on file    Allergies: No Known Allergies  Medications:  Current Outpatient Prescriptions  Medication Sig Dispense Refill  . acyclovir (ZOVIRAX) 400 MG tablet Take 1 tablet (400 mg total) by mouth daily. (Patient not taking: Reported on 04/16/2017) 30 tablet 3  . allopurinol (ZYLOPRIM) 100 MG tablet Take 1 tablet (100 mg total) by mouth 2 (two) times daily. (Patient not taking: Reported on 04/16/2017) 60 tablet 0  . dexamethasone (DECADRON) 4 MG tablet Take 2 tablets (8 mg total) by mouth daily. Start the day after bendamustine chemotherapy for 2 days. Take with food. (Patient not taking: Reported on 04/16/2017) 30 tablet 1  . lidocaine-prilocaine (EMLA) cream Apply 1 application topically as needed. (Patient not taking: Reported on 11/06/2016) 30 g 6   No current facility-administered medications for this visit.    Facility-Administered Medications Ordered in Other Visits  Medication Dose Route Frequency Provider Last Rate Last Dose  . bendamustine (BENDEKA) 200 mg in sodium chloride 0.9 % 50 mL (3.4483 mg/mL)  chemo infusion  90 mg/m2 (Treatment Plan Recorded) Intravenous Once Brunetta Genera, MD      . heparin lock flush 100 unit/mL  500 Units Intracatheter Once PRN Brunetta Genera, MD      . sodium chloride flush (NS) 0.9 % injection 10 mL  10 mL Intracatheter PRN Brunetta Genera, MD        Review of Systems: Review of Systems  All other systems reviewed and are negative.    PHYSICAL EXAMINATION Blood pressure 111/76, pulse 71, temperature 97.9 F (36.6 C), temperature source Oral, resp. rate 18, height '5\' 10"'$  (1.778 m), weight 198 lb (89.8 kg), SpO2 98 %.  ECOG PERFORMANCE STATUS: 0 - Asymptomatic  Physical Exam  Constitutional: He is oriented to person, place, and time and well-developed, well-nourished, and in no distress.  HENT:  Head: Normocephalic and atraumatic.  Mouth/Throat: Oropharynx is clear and moist. No oropharyngeal exudate.  Eyes: EOM are normal. Pupils are equal, round, and reactive to light. No scleral icterus.  Cardiovascular: Normal rate, regular rhythm and normal heart sounds.   No murmur heard. Pulmonary/Chest: Effort normal  and breath sounds normal. No respiratory distress. He has no wheezes.  Abdominal: Soft. Bowel sounds are normal. He exhibits no distension and no mass. There is no tenderness.  Genitourinary:  Genitourinary Comments: Patient declined rectal exam  Musculoskeletal: Normal range of motion.  Lymphadenopathy:       Head (right side): No submandibular and no occipital adenopathy present.       Head (left side): No submandibular and no occipital adenopathy present.    He has no cervical adenopathy.    He has no axillary adenopathy.       Right: No inguinal and no supraclavicular adenopathy present.       Left: No inguinal and no supraclavicular adenopathy present.  Neurological: He is alert and oriented to person, place, and time. He has normal reflexes. No cranial nerve deficit.  Skin: Skin is warm and dry. No rash noted. He is not  diaphoretic. No pallor.     LABORATORY DATA: I have personally reviewed the data as listed: Appointment on 04/16/2017  Component Date Value Ref Range Status  . WBC 04/16/2017 8.4  4.0 - 10.3 10e3/uL Final  . NEUT# 04/16/2017 5.8  1.5 - 6.5 10e3/uL Final  . HGB 04/16/2017 14.6  13.0 - 17.1 g/dL Final  . HCT 04/16/2017 44.1  38.4 - 49.9 % Final  . Platelets 04/16/2017 142  140 - 400 10e3/uL Final  . MCV 04/16/2017 90.7  79.3 - 98.0 fL Final  . MCH 04/16/2017 30.0  27.2 - 33.4 pg Final  . MCHC 04/16/2017 33.1  32.0 - 36.0 g/dL Final  . RBC 04/16/2017 4.86  4.20 - 5.82 10e6/uL Final  . RDW 04/16/2017 15.7* 11.0 - 14.6 % Final  . lymph# 04/16/2017 1.1  0.9 - 3.3 10e3/uL Final  . MONO# 04/16/2017 0.5  0.1 - 0.9 10e3/uL Final  . Eosinophils Absolute 04/16/2017 0.9* 0.0 - 0.5 10e3/uL Final  . Basophils Absolute 04/16/2017 0.1  0.0 - 0.1 10e3/uL Final  . NEUT% 04/16/2017 69.1  39.0 - 75.0 % Final  . LYMPH% 04/16/2017 13.1* 14.0 - 49.0 % Final  . MONO% 04/16/2017 5.6  0.0 - 14.0 % Final  . EOS% 04/16/2017 11.2* 0.0 - 7.0 % Final  . BASO% 04/16/2017 1.0  0.0 - 2.0 % Final  . Sodium 04/16/2017 140  136 - 145 mEq/L Final  . Potassium 04/16/2017 3.9  3.5 - 5.1 mEq/L Final  . Chloride 04/16/2017 104  98 - 109 mEq/L Final  . CO2 04/16/2017 30* 22 - 29 mEq/L Final  . Glucose 04/16/2017 166* 70 - 140 mg/dl Final   Glucose reference range is for nonfasting patients. Fasting glucose reference range is 70- 100.  [name] Kitchen BUN 04/16/2017 11.4  7.0 - 26.0 mg/dL Final  . Creatinine 04/16/2017 0.9  0.7 - 1.3 mg/dL Final  . Total Bilirubin 04/16/2017 0.34  0.20 - 1.20 mg/dL Final  . Alkaline Phosphatase 04/16/2017 92  40 - 150 U/L Final  . AST 04/16/2017 11  5 - 34 U/L Final  . ALT 04/16/2017 17  0 - 55 U/L Final  . Total Protein 04/16/2017 6.4  6.4 - 8.3 g/dL Final  . Albumin 04/16/2017 3.6  3.5 - 5.0 g/dL Final  . Calcium 04/16/2017 9.7  8.4 - 10.4 mg/dL Final  . Anion Gap 04/16/2017 7  3 - 11 mEq/L Final   . EGFR 04/16/2017 >90  >90 ml/min/1.73 m2 Final   eGFR is calculated using the CKD-EPI Creatinine Equation (2009)  Ardath Sax, MD

## 2017-04-16 NOTE — Telephone Encounter (Signed)
No additional appts scheduled per 7/10 - appts already scheduled - patient aware of appts.

## 2017-04-16 NOTE — Patient Instructions (Signed)
[facility] Discharge Instructions for Patients Receiving Chemotherapy  Today you received the following chemotherapy agents Rituxan, Bendeka.   To help prevent nausea and vomiting after your treatment, we encourage you to take your nausea medication as prescribed.    If you develop nausea and vomiting that is not controlled by your nausea medication, call the clinic.   BELOW ARE SYMPTOMS THAT SHOULD BE REPORTED IMMEDIATELY:  *FEVER GREATER THAN 100.5 F  *CHILLS WITH OR WITHOUT FEVER  NAUSEA AND VOMITING THAT IS NOT CONTROLLED WITH YOUR NAUSEA MEDICATION  *UNUSUAL SHORTNESS OF BREATH  *UNUSUAL BRUISING OR BLEEDING  TENDERNESS IN MOUTH AND THROAT WITH OR WITHOUT PRESENCE OF ULCERS  *URINARY PROBLEMS  *BOWEL PROBLEMS  UNUSUAL RASH Items with * indicate a potential emergency and should be followed up as soon as possible.  Feel free to call the clinic you have any questions or concerns. The clinic phone number is [phone].  Please show the [facility] at check-in to the Emergency Department and triage nurse.   Rituximab injection What is this medicine? RITUXIMAB (ri TUX i mab) is a monoclonal antibody. It is used to treat certain types of cancer like non-Hodgkin lymphoma and chronic lymphocytic leukemia. It is also used to treat rheumatoid arthritis, granulomatosis with polyangiitis (or Wegener's granulomatosis), and microscopic polyangiitis. This medicine may be used for other purposes; ask your health care provider or pharmacist if you have questions. COMMON BRAND NAME(S): Rituxan What should I tell my health care provider before I take this medicine? They need to know if you have any of these conditions: -heart disease -infection (especially a virus infection such as hepatitis B, chickenpox, cold sores, or herpes) -immune system problems -irregular heartbeat -kidney disease -lung or breathing disease, like asthma -recently received or scheduled  to receive a vaccine -an unusual or allergic reaction to rituximab, mouse proteins, other medicines, foods, dyes, or preservatives -pregnant or trying to get pregnant -breast-feeding How should I use this medicine? This medicine is for infusion into a vein. It is administered in a hospital or clinic by a specially trained health care professional. A special MedGuide will be given to you by the pharmacist with each prescription and refill. Be sure to read this information carefully each time. Talk to your pediatrician regarding the use of this medicine in children. This medicine is not approved for use in children. Overdosage: If you think you have taken too much of this medicine contact a poison control center or emergency room at once. NOTE: This medicine is only for you. Do not share this medicine with others. What if I miss a dose? It is important not to miss a dose. Call your doctor or health care professional if you are unable to keep an appointment. What may interact with this medicine? -cisplatin -other medicines for arthritis like disease modifying antirheumatic drugs or tumor necrosis factor inhibitors -live virus vaccines This list may not describe all possible interactions. Give your health care provider a list of all the medicines, herbs, non-prescription drugs, or dietary supplements you use. Also tell them if you smoke, drink alcohol, or use illegal drugs. Some items may interact with your medicine. What should I watch for while using this medicine? Your condition will be monitored carefully while you are receiving this medicine. You may need blood work done while you are taking this medicine. This medicine can cause serious allergic reactions. To reduce your risk you may need to take medicine before treatment with this medicine.  Take your medicine as directed. In some patients, this medicine may cause a serious brain infection that may cause death. If you have any problems seeing,  thinking, speaking, walking, or standing, tell your doctor right away. If you cannot reach your doctor, urgently seek other source of medical care. Call your doctor or health care professional for advice if you get a fever, chills or sore throat, or other symptoms of a cold or flu. Do not treat yourself. This drug decreases your body's ability to fight infections. Try to avoid being around people who are sick. Do not become pregnant while taking this medicine or for 12 months after stopping it. Women should inform their doctor if they wish to become pregnant or think they might be pregnant. There is a potential for serious side effects to an unborn child. Talk to your health care professional or pharmacist for more information. What side effects may I notice from receiving this medicine? Side effects that you should report to your doctor or health care professional as soon as possible: -breathing problems -chest pain -dizziness or feeling faint -fast, irregular heartbeat -low blood counts - this medicine may decrease the number of white blood cells, red blood cells and platelets. You may be at increased risk for infections and bleeding. -mouth sores -redness, blistering, peeling or loosening of the skin, including inside the mouth (this can be added for any serious or exfoliative rash that could lead to hospitalization) -signs of infection - fever or chills, cough, sore throat, pain or difficulty passing urine -signs and symptoms of kidney injury like trouble passing urine or change in the amount of urine -signs and symptoms of liver injury like dark yellow or brown urine; general ill feeling or flu-like symptoms; light-colored stools; loss of appetite; nausea; right upper belly pain; unusually weak or tired; yellowing of the eyes or skin -stomach pain -vomiting Side effects that usually do not require medical attention (report to your doctor or health care professional if they continue or are  bothersome): -headache -joint pain -muscle cramps or muscle pain This list may not describe all possible side effects. Call your doctor for medical advice about side effects. You may report side effects to FDA at 1-800-FDA-1088. Where should I keep my medicine? This drug is given in a hospital or clinic and will not be stored at home. NOTE: This sheet is a summary. It may not cover all possible information. If you have questions about this medicine, talk to your doctor, pharmacist, or health care provider.  2018 Elsevier/Gold Standard (2016-05-02 15:28:09)  Bendamustine Injection What is this medicine? BENDAMUSTINE (BEN da MUS teen) is a chemotherapy drug. It is used to treat chronic lymphocytic leukemia and non-Hodgkin lymphoma. This medicine may be used for other purposes; ask your health care provider or pharmacist if you have questions. COMMON BRAND NAME(S): BENDEKA, Treanda What should I tell my health care provider before I take this medicine? They need to know if you have any of these conditions: -infection (especially a virus infection such as chickenpox, cold sores, or herpes) -kidney disease -liver disease -an unusual or allergic reaction to bendamustine, mannitol, other medicines, foods, dyes, or preservatives -pregnant or trying to get pregnant -breast-feeding How should I use this medicine? This medicine is for infusion into a vein. It is given by a health care professional in a hospital or clinic setting. Talk to your pediatrician regarding the use of this medicine in children. Special care may be needed. Overdosage: If you think you  have taken too much of this medicine contact a poison control center or emergency room at once. NOTE: This medicine is only for you. Do not share this medicine with others. What if I miss a dose? It is important not to miss your dose. Call your doctor or health care professional if you are unable to keep an appointment. What may interact with  this medicine? Do not take this medicine with any of the following medications: -clozapine This medicine may also interact with the following medications: -atazanavir -cimetidine -ciprofloxacin -enoxacin -fluvoxamine -medicines for seizures like carbamazepine and phenobarbital -mexiletine -rifampin -tacrine -thiabendazole -zileuton This list may not describe all possible interactions. Give your health care provider a list of all the medicines, herbs, non-prescription drugs, or dietary supplements you use. Also tell them if you smoke, drink alcohol, or use illegal drugs. Some items may interact with your medicine. What should I watch for while using this medicine? This drug may make you feel generally unwell. This is not uncommon, as chemotherapy can affect healthy cells as well as cancer cells. Report any side effects. Continue your course of treatment even though you feel ill unless your doctor tells you to stop. You may need blood work done while you are taking this medicine. Call your doctor or health care professional for advice if you get a fever, chills or sore throat, or other symptoms of a cold or flu. Do not treat yourself. This drug decreases your body's ability to fight infections. Try to avoid being around people who are sick. This medicine may increase your risk to bruise or bleed. Call your doctor or health care professional if you notice any unusual bleeding. Talk to your doctor about your risk of cancer. You may be more at risk for certain types of cancers if you take this medicine. Do not become pregnant while taking this medicine or for 3 months after stopping it. Women should inform their doctor if they wish to become pregnant or think they might be pregnant. Men should not father a child while taking this medicine and for 3 months after stopping it.There is a potential for serious side effects to an unborn child. Talk to your health care professional or pharmacist for more  information. Do not breast-feed an infant while taking this medicine. This medicine may interfere with the ability to have a child. You should talk with your doctor or health care professional if you are concerned about your fertility. What side effects may I notice from receiving this medicine? Side effects that you should report to your doctor or health care professional as soon as possible: -allergic reactions like skin rash, itching or hives, swelling of the face, lips, or tongue -low blood counts - this medicine may decrease the number of white blood cells, red blood cells and platelets. You may be at increased risk for infections and bleeding. -redness, blistering, peeling or loosening of the skin, including inside the mouth -signs of infection - fever or chills, cough, sore throat, pain or difficulty passing urine -signs of decreased platelets or bleeding - bruising, pinpoint red spots on the skin, black, tarry stools, blood in the urine -signs of decreased red blood cells - unusually weak or tired, fainting spells, lightheadedness -signs and symptoms of kidney injury like trouble passing urine or change in the amount of urine -signs and symptoms of liver injury like dark yellow or brown urine; general ill feeling or flu-like symptoms; light-colored stools; loss of appetite; nausea; right upper belly pain; unusually  weak or tired; yellowing of the eyes or skin Side effects that usually do not require medical attention (report to your doctor or health care professional if they continue or are bothersome): -constipation -decreased appetite -diarrhea -headache -mouth sores -nausea/vomiting -tiredness This list may not describe all possible side effects. Call your doctor for medical advice about side effects. You may report side effects to FDA at 1-800-FDA-1088. Where should I keep my medicine? This drug is given in a hospital or clinic and will not be stored at home. NOTE: This sheet is a  summary. It may not cover all possible information. If you have questions about this medicine, talk to your doctor, pharmacist, or health care provider.  2018 Elsevier/Gold Standard (2015-07-28 08:45:41)

## 2017-04-17 ENCOUNTER — Ambulatory Visit (HOSPITAL_BASED_OUTPATIENT_CLINIC_OR_DEPARTMENT_OTHER): Payer: Medicare Other

## 2017-04-17 VITALS — BP 142/69 | HR 84 | Temp 98.1°F | Resp 17

## 2017-04-17 DIAGNOSIS — C8318 Mantle cell lymphoma, lymph nodes of multiple sites: Secondary | ICD-10-CM | POA: Diagnosis not present

## 2017-04-17 DIAGNOSIS — Z5111 Encounter for antineoplastic chemotherapy: Secondary | ICD-10-CM | POA: Diagnosis not present

## 2017-04-17 DIAGNOSIS — Z7189 Other specified counseling: Secondary | ICD-10-CM

## 2017-04-17 MED ORDER — DEXAMETHASONE SODIUM PHOSPHATE 10 MG/ML IJ SOLN
INTRAMUSCULAR | Status: AC
  Filled 2017-04-17: qty 1

## 2017-04-17 MED ORDER — HEPARIN SOD (PORK) LOCK FLUSH 100 UNIT/ML IV SOLN
500.0000 [IU] | Freq: Once | INTRAVENOUS | Status: AC | PRN
  Administered 2017-04-17: 500 [IU]
  Filled 2017-04-17: qty 5

## 2017-04-17 MED ORDER — SODIUM CHLORIDE 0.9 % IV SOLN
Freq: Once | INTRAVENOUS | Status: AC
  Administered 2017-04-17: 09:00:00 via INTRAVENOUS

## 2017-04-17 MED ORDER — BENDAMUSTINE HCL CHEMO INJECTION 100 MG/4ML
90.0000 mg/m2 | Freq: Once | INTRAVENOUS | Status: AC
  Administered 2017-04-17: 200 mg via INTRAVENOUS
  Filled 2017-04-17: qty 8

## 2017-04-17 MED ORDER — SODIUM CHLORIDE 0.9% FLUSH
10.0000 mL | INTRAVENOUS | Status: DC | PRN
  Administered 2017-04-17: 10 mL
  Filled 2017-04-17: qty 10

## 2017-04-17 MED ORDER — DEXAMETHASONE SODIUM PHOSPHATE 10 MG/ML IJ SOLN
10.0000 mg | Freq: Once | INTRAMUSCULAR | Status: AC
  Administered 2017-04-17: 10 mg via INTRAVENOUS

## 2017-04-17 NOTE — Patient Instructions (Signed)
[facility] Discharge Instructions for Patients Receiving Chemotherapy  Today you received the following chemotherapy agents Bendeka.   To help prevent nausea and vomiting after your treatment, we encourage you to take your nausea medication as directed.    If you develop nausea and vomiting that is not controlled by your nausea medication, call the clinic.   BELOW ARE SYMPTOMS THAT SHOULD BE REPORTED IMMEDIATELY:  *FEVER GREATER THAN 100.5 F  *CHILLS WITH OR WITHOUT FEVER  NAUSEA AND VOMITING THAT IS NOT CONTROLLED WITH YOUR NAUSEA MEDICATION  *UNUSUAL SHORTNESS OF BREATH  *UNUSUAL BRUISING OR BLEEDING  TENDERNESS IN MOUTH AND THROAT WITH OR WITHOUT PRESENCE OF ULCERS  *URINARY PROBLEMS  *BOWEL PROBLEMS  UNUSUAL RASH Items with * indicate a potential emergency and should be followed up as soon as possible.  Feel free to call the clinic you have any questions or concerns. The clinic phone number is [phone].  Please show the CHEMO ALERT CARD at check-in to the Emergency Department and triage nurse.   

## 2017-05-14 ENCOUNTER — Ambulatory Visit (HOSPITAL_BASED_OUTPATIENT_CLINIC_OR_DEPARTMENT_OTHER): Payer: Medicare Other | Admitting: Hematology

## 2017-05-14 ENCOUNTER — Ambulatory Visit: Payer: Medicare Other

## 2017-05-14 ENCOUNTER — Ambulatory Visit (HOSPITAL_BASED_OUTPATIENT_CLINIC_OR_DEPARTMENT_OTHER): Payer: Medicare Other

## 2017-05-14 ENCOUNTER — Other Ambulatory Visit (HOSPITAL_BASED_OUTPATIENT_CLINIC_OR_DEPARTMENT_OTHER): Payer: Medicare Other

## 2017-05-14 VITALS — BP 122/72 | HR 76 | Temp 97.7°F | Resp 18 | Ht 70.0 in | Wt 200.6 lb

## 2017-05-14 VITALS — BP 133/75 | HR 72 | Temp 98.3°F | Resp 16

## 2017-05-14 DIAGNOSIS — L281 Prurigo nodularis: Secondary | ICD-10-CM | POA: Diagnosis not present

## 2017-05-14 DIAGNOSIS — C8318 Mantle cell lymphoma, lymph nodes of multiple sites: Secondary | ICD-10-CM

## 2017-05-14 DIAGNOSIS — Z5112 Encounter for antineoplastic immunotherapy: Secondary | ICD-10-CM

## 2017-05-14 DIAGNOSIS — Z7189 Other specified counseling: Secondary | ICD-10-CM

## 2017-05-14 DIAGNOSIS — Z5111 Encounter for antineoplastic chemotherapy: Secondary | ICD-10-CM

## 2017-05-14 DIAGNOSIS — Z720 Tobacco use: Secondary | ICD-10-CM

## 2017-05-14 DIAGNOSIS — Z95828 Presence of other vascular implants and grafts: Secondary | ICD-10-CM

## 2017-05-14 LAB — COMPREHENSIVE METABOLIC PANEL
ALT: 14 U/L (ref 0–55)
ANION GAP: 7 meq/L (ref 3–11)
AST: 13 U/L (ref 5–34)
Albumin: 3.6 g/dL (ref 3.5–5.0)
Alkaline Phosphatase: 82 U/L (ref 40–150)
BILIRUBIN TOTAL: 0.36 mg/dL (ref 0.20–1.20)
BUN: 11.1 mg/dL (ref 7.0–26.0)
CALCIUM: 9.4 mg/dL (ref 8.4–10.4)
CHLORIDE: 104 meq/L (ref 98–109)
CO2: 29 meq/L (ref 22–29)
CREATININE: 0.9 mg/dL (ref 0.7–1.3)
Glucose: 163 mg/dl — ABNORMAL HIGH (ref 70–140)
Potassium: 3.6 mEq/L (ref 3.5–5.1)
SODIUM: 141 meq/L (ref 136–145)
TOTAL PROTEIN: 6.1 g/dL — AB (ref 6.4–8.3)

## 2017-05-14 LAB — CBC WITH DIFFERENTIAL/PLATELET
BASO%: 1 % (ref 0.0–2.0)
BASOS ABS: 0.1 10*3/uL (ref 0.0–0.1)
EOS ABS: 0.5 10*3/uL (ref 0.0–0.5)
EOS%: 7.1 % — ABNORMAL HIGH (ref 0.0–7.0)
HCT: 41.1 % (ref 38.4–49.9)
HGB: 13.2 g/dL (ref 13.0–17.1)
LYMPH%: 5.9 % — AB (ref 14.0–49.0)
MCH: 28.8 pg (ref 27.2–33.4)
MCHC: 32.2 g/dL (ref 32.0–36.0)
MCV: 89.5 fL (ref 79.3–98.0)
MONO#: 0.8 10*3/uL (ref 0.1–0.9)
MONO%: 11.9 % (ref 0.0–14.0)
NEUT#: 5.1 10*3/uL (ref 1.5–6.5)
NEUT%: 74.1 % (ref 39.0–75.0)
Platelets: 151 10*3/uL (ref 140–400)
RBC: 4.59 10*6/uL (ref 4.20–5.82)
RDW: 17.6 % — ABNORMAL HIGH (ref 11.0–14.6)
WBC: 6.9 10*3/uL (ref 4.0–10.3)
lymph#: 0.4 10*3/uL — ABNORMAL LOW (ref 0.9–3.3)

## 2017-05-14 LAB — LACTATE DEHYDROGENASE: LDH: 162 U/L (ref 125–245)

## 2017-05-14 LAB — MAGNESIUM: Magnesium: 2.2 mg/dl (ref 1.5–2.5)

## 2017-05-14 LAB — URIC ACID: URIC ACID, SERUM: 3.8 mg/dL (ref 2.6–7.4)

## 2017-05-14 MED ORDER — DIPHENHYDRAMINE HCL 25 MG PO CAPS
50.0000 mg | ORAL_CAPSULE | Freq: Once | ORAL | Status: AC
  Administered 2017-05-14: 50 mg via ORAL

## 2017-05-14 MED ORDER — DEXAMETHASONE SODIUM PHOSPHATE 10 MG/ML IJ SOLN
10.0000 mg | Freq: Once | INTRAMUSCULAR | Status: AC
  Administered 2017-05-14: 10 mg via INTRAVENOUS

## 2017-05-14 MED ORDER — SODIUM CHLORIDE 0.9 % IV SOLN
375.0000 mg/m2 | Freq: Once | INTRAVENOUS | Status: AC
  Administered 2017-05-14: 800 mg via INTRAVENOUS
  Filled 2017-05-14: qty 50

## 2017-05-14 MED ORDER — SODIUM CHLORIDE 0.9 % IV SOLN
90.0000 mg/m2 | Freq: Once | INTRAVENOUS | Status: AC
  Administered 2017-05-14: 200 mg via INTRAVENOUS
  Filled 2017-05-14: qty 8

## 2017-05-14 MED ORDER — SODIUM CHLORIDE 0.9% FLUSH
10.0000 mL | INTRAVENOUS | Status: DC | PRN
  Administered 2017-05-14: 10 mL
  Filled 2017-05-14: qty 10

## 2017-05-14 MED ORDER — SODIUM CHLORIDE 0.9 % IV SOLN
Freq: Once | INTRAVENOUS | Status: AC
  Administered 2017-05-14: 11:00:00 via INTRAVENOUS

## 2017-05-14 MED ORDER — PALONOSETRON HCL INJECTION 0.25 MG/5ML
0.2500 mg | Freq: Once | INTRAVENOUS | Status: AC
  Administered 2017-05-14: 0.25 mg via INTRAVENOUS

## 2017-05-14 MED ORDER — ACETAMINOPHEN 325 MG PO TABS
ORAL_TABLET | ORAL | Status: AC
  Filled 2017-05-14: qty 2

## 2017-05-14 MED ORDER — ACETAMINOPHEN 325 MG PO TABS
650.0000 mg | ORAL_TABLET | Freq: Once | ORAL | Status: AC
  Administered 2017-05-14: 650 mg via ORAL

## 2017-05-14 MED ORDER — HEPARIN SOD (PORK) LOCK FLUSH 100 UNIT/ML IV SOLN
500.0000 [IU] | Freq: Once | INTRAVENOUS | Status: AC | PRN
  Administered 2017-05-14: 500 [IU]
  Filled 2017-05-14: qty 5

## 2017-05-14 MED ORDER — DIPHENHYDRAMINE HCL 25 MG PO CAPS
ORAL_CAPSULE | ORAL | Status: AC
  Filled 2017-05-14: qty 2

## 2017-05-14 MED ORDER — PALONOSETRON HCL INJECTION 0.25 MG/5ML
INTRAVENOUS | Status: AC
  Filled 2017-05-14: qty 5

## 2017-05-14 MED ORDER — SODIUM CHLORIDE 0.9 % IJ SOLN
10.0000 mL | INTRAMUSCULAR | Status: DC | PRN
  Administered 2017-05-14: 10 mL via INTRAVENOUS
  Filled 2017-05-14: qty 10

## 2017-05-14 MED ORDER — DEXAMETHASONE SODIUM PHOSPHATE 10 MG/ML IJ SOLN
INTRAMUSCULAR | Status: AC
  Filled 2017-05-14: qty 1

## 2017-05-14 MED ORDER — HYDROXYZINE HCL 25 MG PO TABS: 25.0000 mg | ORAL_TABLET | Freq: Four times a day (QID) | ORAL | 0 refills | Status: DC | PRN

## 2017-05-14 NOTE — Patient Instructions (Signed)
[facility] Discharge Instructions for Patients Receiving Chemotherapy  Today you received the following chemotherapy agents: Rituxan and Bendeka.   To help prevent nausea and vomiting after your treatment, we encourage you to take your nausea medication as prescribed.    If you develop nausea and vomiting that is not controlled by your nausea medication, call the clinic.   BELOW ARE SYMPTOMS THAT SHOULD BE REPORTED IMMEDIATELY:  *FEVER GREATER THAN 100.5 F  *CHILLS WITH OR WITHOUT FEVER  NAUSEA AND VOMITING THAT IS NOT CONTROLLED WITH YOUR NAUSEA MEDICATION  *UNUSUAL SHORTNESS OF BREATH  *UNUSUAL BRUISING OR BLEEDING  TENDERNESS IN MOUTH AND THROAT WITH OR WITHOUT PRESENCE OF ULCERS  *URINARY PROBLEMS  *BOWEL PROBLEMS  UNUSUAL RASH Items with * indicate a potential emergency and should be followed up as soon as possible.  Feel free to call the clinic you have any questions or concerns. The clinic phone number is [phone].  Please show the CHEMO ALERT CARD at check-in to the Emergency Department and triage nurse.   

## 2017-05-14 NOTE — Progress Notes (Signed)
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 Patient chooses not to pursue AutoHSCT based strategies which is quite reasonable considering his functional challenges with legal blindness and limited social support.   HISTORY OF PRESENTING ILLNESS: plz see clinic note for details on initial presentation  Interval History  [name] is here for follow-up of his mantle cell lymphoma. He has tolerated his first 2 cycles of Bendamustine and Rituxan without any overt prohibitive toxicities.  Notes that his bowel issues have resolved and he has no further diarrhea or rectal bleeding. No rectal discomfort or urgency. No fevers or chills. Some night sweats. Has gained 2-1/2 pounds since his last visit. Notes significant itching with some red bumps likely related to scratching. Was prescribed hydroxyzine and recommended to use over-the-counter Sarna lotion.    PAST MEDICAL HISTORY:  Past Medical History:  Diagnosis Date  . Abscess of arm, right 10/27/2015  . Lymphoma of lymph nodes of multiple sites ([place]) 10/27/2015  . 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 Shortness of breath 10/27/2015    SURGICAL HISTORY: Past Surgical History:  Procedure Laterality Date  . Cataract surgery     Patient notes she has had bilateral Surgery in 1998 and 99  . FLEXIBLE SIGMOIDOSCOPY N/A 03/01/2017   Procedure: FLEXIBLE SIGMOIDOSCOPY;  Surgeon: Manus Gunning, MD;   Location: [facility];  Service: Gastroenterology;  Laterality: N/A;  . TONSILLECTOMY     about 61years old    SOCIAL HISTORY: Social History   Social History  . Marital status: Widowed    Spouse name: N/A  . Number of children: N/A  . Years of education: N/A   Occupational History  . Not on file.   Social History Main Topics  . Smoking status: Current Every Day Smoker    Packs/day: 1.00    Years: 33.00  . Smokeless tobacco: Never Used  . Alcohol use No  . Drug use: No  . Sexual activity: Not on file     Comment: Disabled, retinis pigmentosa legally blind, 4children MPOA Lynnn(cousin)   Other Topics Concern  . Not on file   Social History Narrative  . No narrative on file    FAMILY HISTORY: Family History  Problem Relation Age of Onset  . Colon cancer Neg Hx     ALLERGIES:  has No Known Allergies.  MEDICATIONS:  Current Outpatient Prescriptions  Medication Sig Dispense Refill  . acyclovir (ZOVIRAX) 400 MG tablet Take 1 tablet (400 mg total) by mouth daily. (Patient not taking: Reported on 04/16/2017) 30 tablet 3  . allopurinol (ZYLOPRIM) 100 MG tablet Take 1 tablet (100 mg total) by mouth 2 (two) times daily. (Patient not taking: Reported on 04/16/2017) 60 tablet 0  . dexamethasone (DECADRON) 4 MG tablet Take 2 tablets (8 mg total) by mouth daily. Start the day after bendamustine chemotherapy for 2 days. Take with food. (Patient not taking: Reported on 04/16/2017) 30 tablet 1  . hydrOXYzine (ATARAX/VISTARIL) 25 MG  tablet Take 1 tablet (25 mg total) by mouth every 6 (six) hours as needed for itching. 50 tablet 0  . lidocaine-prilocaine (EMLA) cream Apply 1 application topically as needed. (Patient not taking: Reported on 11/06/2016) 30 g 6   No current facility-administered medications for this visit.     REVIEW OF SYSTEMS:    10 Point review of Systems was done is negative except as noted above.  PHYSICAL EXAMINATION: ECOG PERFORMANCE STATUS: 2 - Symptomatic,  <50% confined to bed .BP 122/72 (BP Location: Left Arm, Patient Position: Sitting)   Pulse 76   Temp 97.7 F (36.5 C) (Oral)   Resp 18   Ht 5\' 10"  (1.778 m)   Wt 200 lb 9.6 oz (91 kg)   SpO2 98%   BMI 28.78 kg/m   GENERAL:alert, in no acute distress and comfortable SKIN: some nodular skin lesions over extremities and back, beginning crusted lesions over his right scapular area. EYES: normal, conjunctiva are pink and non-injected, sclera clear. Poor visual acuity - some directional light perception but difficulty with finger counting. OROPHARYNX:no exudate, no erythema and lips, multiple carious teeth with significant periodontal disease. NECK: supple, no JVD, thyroid normal size, non-tender, without nodularity LYMPH:  no palpable lymphadenopathy in the cervical, axillary or inguinal LUNGS: clear to auscultation with normal respiratory effort HEART: regular rate & rhythm,  no murmurs and no lower extremity edema ABDOMEN: abdomen soft, non-tender, normoactive bowel sounds  Musculoskeletal: no cyanosis of digits and no clubbing  PSYCH: alert & oriented x 3 with fluent speech NEURO: no focal motor/sensory deficits  LABORATORY DATA:  I have reviewed the data as listed   CBC Latest Ref Rng & Units 05/14/2017 04/16/2017 03/26/2017  WBC 4.0 - 10.3 10e3/uL 6.9 8.4 9.5  Hemoglobin 13.0 - 17.1 g/dL 13.2 14.6 13.4  Hematocrit 38.4 - 49.9 % 41.1 44.1 42.0  Platelets 140 - 400 10e3/uL 151 142 232    . CMP Latest Ref Rng & Units 05/14/2017 04/16/2017 03/26/2017  Glucose 70 - 140 mg/dl 163(H) 166(H) 183(H)  BUN 7.0 - 26.0 mg/dL 11.1 11.4 13.2  Creatinine 0.7 - 1.3 mg/dL 0.9 0.9 0.9  Sodium 136 - 145 mEq/L 141 140 141  Potassium 3.5 - 5.1 mEq/L 3.6 3.9 3.5  Chloride 101 - 111 mmol/L - - -  CO2 22 - 29 mEq/L 29 30(H) 28  Calcium 8.4 - 10.4 mg/dL 9.4 9.7 9.3  Total Protein 6.4 - 8.3 g/dL 6.1(L) 6.4 6.2(L)  Total Bilirubin 0.20 - 1.20 mg/dL 0.36 0.34 0.46  Alkaline Phos 40 - 150 U/L 82 92 84    AST 5 - 34 U/L 13 11 10   ALT 0 - 55 U/L 14 17 19     Lab Results  Component Value Date   LDH 200 03/07/2017     RADIOGRAPHIC STUDIES: I have personally reviewed the radiological images as listed and agreed with the findings in the report. No results found.  ECHO: 11/01/2015: Study Conclusions  - Left ventricle: The cavity size was normal. There was mild concentric hypertrophy. Systolic function was normal. The estimated ejection fraction was in the range of 50% to 55%. Wall motion was normal; there were no regional wall motion abnormalities. Doppler parameters are consistent with abnormal left ventricular relaxation (grade 1 diastolic dysfunction). - Aortic valve: Transvalvular velocity was within the normal range. There was no stenosis. There was no regurgitation. - Mitral valve: There was no regurgitation. - Right ventricle: The cavity size was normal. Wall thickness was normal. Systolic  function was normal. - Tricuspid valve: There was no regurgitation. - Inferior vena cava: The vessel was normal in size. The respirophasic diameter changes were in the normal range (>= 50%), consistent with normal central venous pressure.Study Conclusions  - Left ventricle: The cavity size was normal. There was mild concentric hypertrophy. Systolic function was normal. The estimated ejection fraction was in the range of 50% to 55%. Wall motion was normal; there were no regional wall motion abnormalities. Doppler parameters are consistent with abnormal left ventricular relaxation (grade 1 diastolic dysfunction). - Aortic valve: Transvalvular velocity was within the normal range. There was no stenosis. There was no regurgitation. - Mitral valve: There was no regurgitation. - Right ventricle: The cavity size was normal. Wall thickness was normal. Systolic function was normal. - Tricuspid valve: There was no regurgitation. - Inferior vena cava: The vessel was  normal in size. The respirophasic diameter changes were in the normal range (>= 50%), consistent with normal central venous pressure.    ASSESSMENT & PLAN:    61 yo man with   1) h/o previous Stage IVB E Mantle Cell lymphoma with likely gastric involvement, extensive LNadenopathy. ECHO nl EF ECOG PS 2 but activities significantly limited due to being legally blind HIV/Hep C/Hep B neg Baseline LDH 450--->135-->163  PET/CT scan after 3 cycles of R CHOP show good overall response. Stomach lesion still with very FDG uptake ? Residual lymphoma versus inflammation.  Patient is status post 6 cycles of R CHOP. PET/CT after 6 cycles shows resolution of hypermetabolic lymphadenopathy and gastric wall activity . Patient was on maintenance Rituxan for 1 year now prior to relapse.  2) Relapsed Pleomorphic mantle cell lymphoma Presented with rectal bleeding and constipation with a large rectosigmoid mass and other evidence of colonic involvement. CT chest abdomen pelvis showed multiple areas of lymphadenopathy in the abdomen as well as in the chest suggesting significant recurrence. PET/CT scan shows diffuse involvement with mantle cell lymphoma involving the chest abdomen pelvis and bowels.  PLAN -Labs stable. Patient is appropriate to proceed with his third cycle of bendamustine and Rituxan chemotherapy. -We'll get a PET CT scan in 2-3 weeks to reevaluate response to second line bendamustine and Rituxan chemotherapy. -If he is responding well would plan to finish 6 cycles of bendamustine and Rituxan.  2) Retinitis pigmentosa causing legal blindness -  is seeing a retina specialist in town. No acute interventions at this time. Notes that his vision is progressively getting worse. His daughter and granddaughter are helping him out at home. He was encouraged to use some of the resources for the blind or through the retinitis pigment[employer] to try   3) Weight loss and protein calorie  malnutrition. Resolved. Weight has improved significantly since his diagnosis and back to his baseline weight.  Wt Readings from Last 3 Encounters:  05/14/17 200 lb 9.6 oz (91 kg)  04/16/17 198 lb (89.8 kg)  03/26/17 198 lb 3.2 oz (89.9 kg)   4) h/o Peri-orbital dermatitis /Conjuncitivitis - patient was seen by the dermatologist and given topical desonide ointment which led to the resolution of his dermatitis. There were not sure about the etiology but noted then to could not rule out Rituxan is a possibility. Currently his dermatitis has resolved and his conjunctivitis has resolved. He did not have rash anywhere else at the time. No oral sores or other toxicities. This happened about 4 days after Rituxan another chemotherapy. This has completely resolved and patient has not had an further issues  with this with several additional doses of Rituxan.  5) Diarrhea - he was constipated due to his rectosigmoid mass and how has been having some loose stools. This could be from bowel involvement from his mantle cell lymphoma. -His bowel issues have resolved with second line treatment .  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 Cannot r/o cutaneous involvement by mantle cell lymphoma though this is less likely .  Plan - patient knows that he is had this previously and steroids didn't help . -Recommended over-the-counter Sarna ointment -Hydroxyzine 25 mg every 6 hours when necessary prescribed for use as necessary for pruritus . -Recommended using daily moisturizer and avoiding excessive hot showers .  6) Patient Active Problem List   Diagnosis Date Noted  . Counseling regarding advanced care planning and goals of care 03/13/2017  . Rectal mass 03/02/2017  . Colon cancer metastasized to liver and lung([facility]) 03/02/2017  . Rectal bleeding 02/28/2017  . Portacath in place 01/30/2016  . Skin lesion of chest wall 12/19/2015  . Shingles 12/19/2015  . Rash 11/28/2015  .  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 Mantle cell lymphoma of lymph nodes of multiple regions ([place]) 11/01/2015  . Protein-calorie malnutrition ([place]) 11/01/2015  . Abscess of arm, right 10/27/2015  . Lymphoma of lymph nodes of multiple sites ([place]) 10/27/2015  . Shortness of breath 10/27/2015  . Shortness of breath dyspnea 10/27/2015  . Hypoalbuminemia 10/25/2015  . Elevated lipase 10/25/2015  . Inguinal hernia 10/25/2015  . Urinary retention 10/25/2015  . Abdominal pain   . Lymphadenopathy 10/24/2015   -continue f/u with PCP for other continued medical issues.  PET/CT in 2-3 weeks Please schedule next 2 cycles of bendamustine and Rituxan Return to clinic with Dr. Irene Limbo in 4 weeks with cycle 4 day 1 of treatment with labs and PET/CT scan results  I spent 30 minutes counseling the patient face to face. The total time spent in the appointment was 40 minutes and more than 50% was on counseling and direct patient cares. Patient was accompanied by his grand-daughter for this visit .    Sullivan Lone MD [place] AAHIVMS [facility] Hematology/Oncology Physician [facility]  (Office):       [phone] (Work cell):  [phone] (Fax):           [phone]

## 2017-05-14 NOTE — Patient Instructions (Signed)

## 2017-05-15 ENCOUNTER — Ambulatory Visit (HOSPITAL_BASED_OUTPATIENT_CLINIC_OR_DEPARTMENT_OTHER): Payer: Medicare Other

## 2017-05-15 DIAGNOSIS — Z5111 Encounter for antineoplastic chemotherapy: Secondary | ICD-10-CM | POA: Diagnosis not present

## 2017-05-15 DIAGNOSIS — C8318 Mantle cell lymphoma, lymph nodes of multiple sites: Secondary | ICD-10-CM

## 2017-05-15 DIAGNOSIS — Z7189 Other specified counseling: Secondary | ICD-10-CM

## 2017-05-15 LAB — PHOSPHORUS: Phosphorus, Ser: 3.3 mg/dL (ref 2.5–4.5)

## 2017-05-15 MED ORDER — DEXAMETHASONE SODIUM PHOSPHATE 10 MG/ML IJ SOLN
INTRAMUSCULAR | Status: AC
  Filled 2017-05-15: qty 1

## 2017-05-15 MED ORDER — SODIUM CHLORIDE 0.9% FLUSH
10.0000 mL | INTRAVENOUS | Status: DC | PRN
  Administered 2017-05-15: 10 mL
  Filled 2017-05-15: qty 10

## 2017-05-15 MED ORDER — HEPARIN SOD (PORK) LOCK FLUSH 100 UNIT/ML IV SOLN
500.0000 [IU] | Freq: Once | INTRAVENOUS | Status: AC | PRN
  Administered 2017-05-15: 500 [IU]
  Filled 2017-05-15: qty 5

## 2017-05-15 MED ORDER — SODIUM CHLORIDE 0.9 % IV SOLN
Freq: Once | INTRAVENOUS | Status: AC
  Administered 2017-05-15: 10:00:00 via INTRAVENOUS

## 2017-05-15 MED ORDER — SODIUM CHLORIDE 0.9 % IV SOLN
90.0000 mg/m2 | Freq: Once | INTRAVENOUS | Status: AC
  Administered 2017-05-15: 200 mg via INTRAVENOUS
  Filled 2017-05-15: qty 8

## 2017-05-15 MED ORDER — DEXAMETHASONE SODIUM PHOSPHATE 10 MG/ML IJ SOLN
10.0000 mg | Freq: Once | INTRAMUSCULAR | Status: AC
  Administered 2017-05-15: 10 mg via INTRAVENOUS

## 2017-05-15 NOTE — Patient Instructions (Addendum)
[facility] Discharge Instructions for Patients Receiving Chemotherapy  Today you received the following chemotherapy agents :  Bend[place]a ( Bendamustine ).  To help prevent nausea and vomiting after your treatment, we encourage you to take your nausea medication as prescribed.   If you develop nausea and vomiting that is not controlled by your nausea medication, call the clinic.   BELOW ARE SYMPTOMS THAT SHOULD BE REPORTED IMMEDIATELY:  *FEVER GREATER THAN 100.5 F  *CHILLS WITH OR WITHOUT FEVER  NAUSEA AND VOMITING THAT IS NOT CONTROLLED WITH YOUR NAUSEA MEDICATION  *UNUSUAL SHORTNESS OF BREATH  *UNUSUAL BRUISING OR BLEEDING  TENDERNESS IN MOUTH AND THROAT WITH OR WITHOUT PRESENCE OF ULCERS  *URINARY PROBLEMS  *BOWEL PROBLEMS  UNUSUAL RASH Items with * indicate a potential emergency and should be followed up as soon as possible.  Feel free to call the clinic you have any questions or concerns. The clinic phone number is [phone].  Please show the Major at check-in to the Emergency Department and triage nurse.

## 2017-05-23 ENCOUNTER — Telehealth: Payer: Self-pay

## 2017-05-23 NOTE — Telephone Encounter (Signed)
Left voice message with upcoming appointment for 9/4.

## 2017-06-11 ENCOUNTER — Encounter: Payer: Self-pay | Admitting: Hematology

## 2017-06-11 ENCOUNTER — Ambulatory Visit (HOSPITAL_BASED_OUTPATIENT_CLINIC_OR_DEPARTMENT_OTHER): Payer: Medicare Other

## 2017-06-11 ENCOUNTER — Other Ambulatory Visit (HOSPITAL_BASED_OUTPATIENT_CLINIC_OR_DEPARTMENT_OTHER): Payer: Medicare Other

## 2017-06-11 ENCOUNTER — Ambulatory Visit (HOSPITAL_BASED_OUTPATIENT_CLINIC_OR_DEPARTMENT_OTHER): Payer: Medicare Other | Admitting: Hematology

## 2017-06-11 VITALS — BP 130/73 | HR 76 | Temp 98.6°F | Resp 18

## 2017-06-11 VITALS — BP 118/69 | HR 73 | Temp 98.5°F | Resp 18 | Ht 70.0 in | Wt 200.7 lb

## 2017-06-11 DIAGNOSIS — L281 Prurigo nodularis: Secondary | ICD-10-CM | POA: Diagnosis not present

## 2017-06-11 DIAGNOSIS — Z720 Tobacco use: Secondary | ICD-10-CM

## 2017-06-11 DIAGNOSIS — C8318 Mantle cell lymphoma, lymph nodes of multiple sites: Secondary | ICD-10-CM | POA: Diagnosis not present

## 2017-06-11 DIAGNOSIS — Z5112 Encounter for antineoplastic immunotherapy: Secondary | ICD-10-CM

## 2017-06-11 DIAGNOSIS — Z7189 Other specified counseling: Secondary | ICD-10-CM

## 2017-06-11 DIAGNOSIS — Z5111 Encounter for antineoplastic chemotherapy: Secondary | ICD-10-CM | POA: Diagnosis not present

## 2017-06-11 DIAGNOSIS — Z95828 Presence of other vascular implants and grafts: Secondary | ICD-10-CM

## 2017-06-11 LAB — COMPREHENSIVE METABOLIC PANEL
ALT: 18 U/L (ref 0–55)
AST: 13 U/L (ref 5–34)
Albumin: 3.6 g/dL (ref 3.5–5.0)
Alkaline Phosphatase: 79 U/L (ref 40–150)
Anion Gap: 8 mEq/L (ref 3–11)
BILIRUBIN TOTAL: 0.36 mg/dL (ref 0.20–1.20)
BUN: 13.3 mg/dL (ref 7.0–26.0)
CO2: 30 meq/L — AB (ref 22–29)
Calcium: 9.9 mg/dL (ref 8.4–10.4)
Chloride: 102 mEq/L (ref 98–109)
Creatinine: 1 mg/dL (ref 0.7–1.3)
GLUCOSE: 198 mg/dL — AB (ref 70–140)
Potassium: 3.9 mEq/L (ref 3.5–5.1)
SODIUM: 140 meq/L (ref 136–145)
TOTAL PROTEIN: 6.7 g/dL (ref 6.4–8.3)

## 2017-06-11 LAB — CBC & DIFF AND RETIC
BASO%: 0.5 % (ref 0.0–2.0)
BASOS ABS: 0 10*3/uL (ref 0.0–0.1)
EOS%: 6.2 % (ref 0.0–7.0)
Eosinophils Absolute: 0.5 10*3/uL (ref 0.0–0.5)
HEMATOCRIT: 44.7 % (ref 38.4–49.9)
HEMOGLOBIN: 14.3 g/dL (ref 13.0–17.1)
Immature Retic Fract: 5.9 % (ref 3.00–10.60)
LYMPH%: 12.4 % — ABNORMAL LOW (ref 14.0–49.0)
MCH: 29.5 pg (ref 27.2–33.4)
MCHC: 32 g/dL (ref 32.0–36.0)
MCV: 92.4 fL (ref 79.3–98.0)
MONO#: 0.4 10*3/uL (ref 0.1–0.9)
MONO%: 5.6 % (ref 0.0–14.0)
NEUT#: 5.9 10*3/uL (ref 1.5–6.5)
NEUT%: 75.3 % — ABNORMAL HIGH (ref 39.0–75.0)
NRBC: 0 % (ref 0–0)
Platelets: 142 10*3/uL (ref 140–400)
RBC: 4.84 10*6/uL (ref 4.20–5.82)
RDW: 16.2 % — AB (ref 11.0–14.6)
Retic %: 1.24 % (ref 0.80–1.80)
Retic Ct Abs: 60.02 10*3/uL (ref 34.80–93.90)
WBC: 7.9 10*3/uL (ref 4.0–10.3)
lymph#: 1 10*3/uL (ref 0.9–3.3)

## 2017-06-11 LAB — LACTATE DEHYDROGENASE: LDH: 144 U/L (ref 125–245)

## 2017-06-11 MED ORDER — DIPHENHYDRAMINE HCL 25 MG PO CAPS
ORAL_CAPSULE | ORAL | Status: AC
  Filled 2017-06-11: qty 2

## 2017-06-11 MED ORDER — PALONOSETRON HCL INJECTION 0.25 MG/5ML
0.2500 mg | Freq: Once | INTRAVENOUS | Status: AC
  Administered 2017-06-11: 0.25 mg via INTRAVENOUS

## 2017-06-11 MED ORDER — SODIUM CHLORIDE 0.9% FLUSH
10.0000 mL | INTRAVENOUS | Status: DC | PRN
  Administered 2017-06-11: 10 mL
  Filled 2017-06-11: qty 10

## 2017-06-11 MED ORDER — HEPARIN SOD (PORK) LOCK FLUSH 100 UNIT/ML IV SOLN
500.0000 [IU] | Freq: Once | INTRAVENOUS | Status: AC | PRN
  Administered 2017-06-11: 500 [IU]
  Filled 2017-06-11: qty 5

## 2017-06-11 MED ORDER — RITUXIMAB CHEMO INJECTION 500 MG/50ML
375.0000 mg/m2 | Freq: Once | INTRAVENOUS | Status: AC
  Administered 2017-06-11: 800 mg via INTRAVENOUS
  Filled 2017-06-11: qty 50

## 2017-06-11 MED ORDER — SODIUM CHLORIDE 0.9 % IV SOLN
Freq: Once | INTRAVENOUS | Status: AC
  Administered 2017-06-11: 14:00:00 via INTRAVENOUS

## 2017-06-11 MED ORDER — DEXAMETHASONE SODIUM PHOSPHATE 10 MG/ML IJ SOLN
INTRAMUSCULAR | Status: AC
  Filled 2017-06-11: qty 1

## 2017-06-11 MED ORDER — ACETAMINOPHEN 325 MG PO TABS
650.0000 mg | ORAL_TABLET | Freq: Once | ORAL | Status: AC
  Administered 2017-06-11: 650 mg via ORAL

## 2017-06-11 MED ORDER — ACETAMINOPHEN 325 MG PO TABS
ORAL_TABLET | ORAL | Status: AC
  Filled 2017-06-11: qty 2

## 2017-06-11 MED ORDER — SODIUM CHLORIDE 0.9 % IV SOLN
90.0000 mg/m2 | Freq: Once | INTRAVENOUS | Status: AC
  Administered 2017-06-11: 200 mg via INTRAVENOUS
  Filled 2017-06-11: qty 8

## 2017-06-11 MED ORDER — PALONOSETRON HCL INJECTION 0.25 MG/5ML
INTRAVENOUS | Status: AC
  Filled 2017-06-11: qty 5

## 2017-06-11 MED ORDER — DIPHENHYDRAMINE HCL 25 MG PO CAPS
50.0000 mg | ORAL_CAPSULE | Freq: Once | ORAL | Status: AC
  Administered 2017-06-11: 50 mg via ORAL

## 2017-06-11 MED ORDER — DEXAMETHASONE SODIUM PHOSPHATE 10 MG/ML IJ SOLN
10.0000 mg | Freq: Once | INTRAMUSCULAR | Status: AC
  Administered 2017-06-11: 10 mg via INTRAVENOUS

## 2017-06-11 NOTE — Progress Notes (Signed)
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 Patient chooses not to pursue AutoHSCT based strategies which is quite reasonable considering his functional challenges with legal blindness and limited social support.   HISTORY OF PRESENTING ILLNESS: plz see clinic note for details on initial presentation  Interval History  Kristopher Johnson is here for follow-up of his mantle cell lymphoma and prior to his fourth cycle of bendamustine and Rituxan. He has tolerated his first 3 cycles of Bendamustine and Rituxan without any overt prohibitive toxicities. Overall he is doing well. He has noticed some raised nodules which are mildly pruritic to the bilateral lower extremities which are much improved. He also has experienced some intermittent night sweats which are much better.  He denies fever, chills, shortness of breath, bloody stools, diarrhea, constipation, bowel frequency, or any other associated complaints.  He is set to move out of his house towards the end of this month. He is unsure of where he will be moving to.    PAST MEDICAL HISTORY:  Past Medical History:  Diagnosis Date   Abscess of arm, right 10/27/2015   Lymphoma of lymph nodes of multiple sites [facility]) 10/27/2015   Retinitis pigmentosa    Patient notes that he has been declared legally blind since 1983 and is on Social Security disability   Shortness of breath 10/27/2015    SURGICAL HISTORY: Past Surgical History:  Procedure Laterality Date   Cataract surgery     Patient notes she has had  bilateral Surgery in 1998 and [place] N/A 03/01/2017   Procedure: FLEXIBLE SIGMOIDOSCOPY;  Surgeon: Manus Gunning, MD;  Location: [facility];  Service: Gastroenterology;  Laterality: N/A;   TONSILLECTOMY     about 61years old    SOCIAL HISTORY: Social History   Social History   Marital status: Widowed    Spouse name: N/A   Number of children: N/A   Years of education: N/A   Occupational History   Not on file.   Social History Main Topics   Smoking status: Current Every Day Smoker    Packs/day: 1.00    Years: 33.00   Smokeless tobacco: Never Used   Alcohol use No   Drug use: No   Sexual activity: Not on file     Comment: Disabled, retinis pigmentosa legally blind, 4children MPOA Lynnn(cousin)   Other Topics Concern   Not on file   Social History Narrative   No narrative on file    FAMILY HISTORY: Family History  Problem Relation Age of Onset   Colon cancer Neg Hx     ALLERGIES:  has No Known Allergies.  MEDICATIONS:  Current Outpatient Prescriptions  Medication Sig Dispense Refill   acyclovir (ZOVIRAX) 400 MG tablet Take 1 tablet (400 mg total) by mouth daily. (Patient not taking: Reported on 04/16/2017) 30 tablet 3   allopurinol (ZYLOPRIM) 100 MG tablet Take 1 tablet (100 mg total) by mouth 2 (two) times daily. (Patient not taking: Reported on 04/16/2017) 60 tablet 0   dexamethasone (DECADRON) 4 MG tablet Take 2 tablets (8 mg total) by  mouth daily. Start the day after bendamustine chemotherapy for 2 days. Take with food. (Patient not taking: Reported on 04/16/2017) 30 tablet 1   hydrOXYzine (ATARAX/VISTARIL) 25 MG tablet Take 1 tablet (25 mg total) by mouth every 6 (six) hours as needed for itching. 50 tablet 0   lidocaine-prilocaine (EMLA) cream Apply 1 application topically as needed. (Patient not taking: Reported on 11/06/2016) 30 g 6   No current facility-administered medications for this visit.     Facility-Administered Medications Ordered in Other Visits  Medication Dose Route Frequency Provider Last Rate Last Dose   bendamustine (BENDEKA) 200 mg in sodium chloride 0.9 % 50 mL (3.4483 mg/mL) chemo infusion  90 mg/m2 (Treatment Plan Recorded) Intravenous Once Brunetta Genera, MD       heparin lock flush 100 unit/mL  500 Units Intracatheter Once PRN Brunetta Genera, MD       sodium chloride flush (NS) 0.9 % injection 10 mL  10 mL Intracatheter PRN Brunetta Genera, MD        REVIEW OF SYSTEMS:    10 Point review of Systems was done is negative except as noted above.  PHYSICAL EXAMINATION:  ECOG PERFORMANCE STATUS: 2 - Symptomatic, <50% confined to bed .BP 118/69 (BP Location: Left Arm, Patient Position: Sitting)    Pulse 73    Temp 98.5 F (36.9 C) (Oral)    Resp 18    Ht 5\' 10"  (1.778 m)    Wt 200 lb 11.2 oz (91 kg)    SpO2 97%    BMI 28.80 kg/m   GENERAL:alert, in no acute distress and comfortable SKIN: some nodular skin lesions over extremities -much improved EYES: normal, conjunctiva are pink and non-injected, sclera clear. Poor visual acuity - some directional light perception but difficulty with finger counting. OROPHARYNX:no exudate, no erythema and lips, multiple carious teeth with significant periodontal disease. NECK: supple, no JVD, thyroid normal size, non-tender, without nodularity LYMPH:  no palpable lymphadenopathy in the cervical, axillary or inguinal LUNGS: clear to auscultation with normal respiratory effort HEART: regular rate & rhythm,  no murmurs and no lower extremity edema ABDOMEN: abdomen soft, non-tender, normoactive bowel sounds  Musculoskeletal: no cyanosis of digits and no clubbing  PSYCH: alert & oriented x 3 with fluent speech NEURO: no focal motor/sensory deficits  LABORATORY DATA:  I have reviewed the data as listed   CBC Latest Ref Rng & Units 06/11/2017 05/14/2017 04/16/2017  WBC 4.0 - 10.3 10e3/uL 7.9 6.9 8.4  Hemoglobin 13.0  - 17.1 g/dL 14.3 13.2 14.6  Hematocrit 38.4 - 49.9 % 44.7 41.1 44.1  Platelets 140 - 400 10e3/uL 142 151 142    . CMP Latest Ref Rng & Units 06/11/2017 05/14/2017 04/16/2017  Glucose 70 - 140 mg/dl 198(H) 163(H) 166(H)  BUN 7.0 - 26.0 mg/dL 13.3 11.1 11.4  Creatinine 0.7 - 1.3 mg/dL 1.0 0.9 0.9  Sodium 136 - 145 mEq/L 140 141 140  Potassium 3.5 - 5.1 mEq/L 3.9 3.6 3.9  Chloride 101 - 111 mmol/L - - -  CO2 22 - 29 mEq/L 30(H) 29 30(H)  Calcium 8.4 - 10.4 mg/dL 9.9 9.4 9.7  Total Protein 6.4 - 8.3 g/dL 6.7 6.1(L) 6.4  Total Bilirubin 0.20 - 1.20 mg/dL 0.36 0.36 0.34  Alkaline Phos 40 - 150 U/L 79 82 92  AST 5 - 34 U/L 13 13 11   ALT 0 - 55 U/L 18 14 17     Lab Results  Component Value Date   LDH 144  06/11/2017     RADIOGRAPHIC STUDIES: I have personally reviewed the radiological images as listed and agreed with the findings in the report. No results found.  ECHO: 11/01/2015: Study Conclusions  - Left ventricle: The cavity size was normal. There was mild concentric hypertrophy. Systolic function was normal. The estimated ejection fraction was in the range of 50% to 55%. Wall motion was normal; there were no regional wall motion abnormalities. Doppler parameters are consistent with abnormal left ventricular relaxation (grade 1 diastolic dysfunction). - Aortic valve: Transvalvular velocity was within the normal range. There was no stenosis. There was no regurgitation. - Mitral valve: There was no regurgitation. - Right ventricle: The cavity size was normal. Wall thickness was normal. Systolic function was normal. - Tricuspid valve: There was no regurgitation. - Inferior vena cava: The vessel was normal in size. The respirophasic diameter changes were in the normal range (>= 50%), consistent with normal central venous pressure.Study Conclusions  - Left ventricle: The cavity size was normal. There was mild concentric hypertrophy. Systolic function was normal.  The estimated ejection fraction was in the range of 50% to 55%. Wall motion was normal; there were no regional wall motion abnormalities. Doppler parameters are consistent with abnormal left ventricular relaxation (grade 1 diastolic dysfunction). - Aortic valve: Transvalvular velocity was within the normal range. There was no stenosis. There was no regurgitation. - Mitral valve: There was no regurgitation. - Right ventricle: The cavity size was normal. Wall thickness was normal. Systolic function was normal. - Tricuspid valve: There was no regurgitation. - Inferior vena cava: The vessel was normal in size. The respirophasic diameter changes were in the normal range (>= 50%), consistent with normal central venous pressure.  ASSESSMENT & PLAN:    61 yo man with   1) h/o previous Stage IVB E Mantle Cell lymphoma with likely gastric involvement, extensive LNadenopathy. ECHO nl EF ECOG PS 2 but activities significantly limited due to being legally blind HIV/Hep C/Hep B neg Baseline LDH 450--->135-->163-->144  PET/CT scan after 3 cycles of R CHOP show good overall response. Stomach lesion still with very FDG uptake ? Residual lymphoma versus inflammation.  Patient is status post 6 cycles of R CHOP. PET/CT after 6 cycles shows resolution of hypermetabolic lymphadenopathy and gastric wall activity . Patient was on maintenance Rituxan for 1 year now prior to relapse.  2) Relapsed Pleomorphic mantle cell lymphoma Presented with rectal bleeding and constipation with a large rectosigmoid mass and other evidence of colonic involvement. CT chest abdomen pelvis showed multiple areas of lymphadenopathy in the abdomen as well as in the chest suggesting significant recurrence. PET/CT scan shows diffuse involvement with mantle cell lymphoma involving the chest abdomen pelvis and bowels.  -Clinically he is doing well today, 06/11/17. His bowel symptoms seemed to have resolved and he  seems to be responding to treatment well. He seems more comfortable on my examination.  - We will plan for repeat PET/CT on 9/12 to determine the overall effectiveness of treatment. He will f/u with [place] in 4 weeks to discuss these results and with repeat labs.  -Continue with remaining 2 cycles of Bendamustine and Rituxan every 4 weeks.   PLAN -Labs stable. Patient is appropriate to proceed with his fourth cycle of bendamustine and Rituxan chemotherapy.  -We'll get a PET/CT scan as scheduled on 06/19/17 to reevaluate response to second line bendamustine and Rituxan chemotherapy.   2) Retinitis pigmentosa causing legal blindness -  is seeing a retina specialist in town. No acute  interventions at this time. Notes that his vision is progressively getting worse. His daughter and granddaughter are helping him out at home. He was encouraged to use some of the resources for the blind or through the retinitis pigmentosa Foundation   3) Weight loss and protein calorie malnutrition. Resolved. Weight has improved significantly since his diagnosis and back to his baseline weight.  Wt Readings from Last 3 Encounters:  06/11/17 200 lb 11.2 oz (91 kg)  05/14/17 200 lb 9.6 oz (91 kg)  04/16/17 198 lb (89.8 kg)   4) h/o Peri-orbital dermatitis /Conjuncitivitis - patient was seen by the dermatologist and given topical desonide ointment which led to the resolution of his dermatitis. There were not sure about the etiology but noted then to could not rule out Rituxan is a possibility. Currently his dermatitis has resolved and his conjunctivitis has resolved. He did not have rash anywhere else at the time. No oral sores or other toxicities. This happened about 4 days after Rituxan another chemotherapy. This has completely resolved and patient has not had an further issues with this with several additional doses of Rituxan.  5) Diarrhea - he was constipated due to his rectosigmoid mass and how has been having some loose  stools. This could be from bowel involvement from his mantle cell lymphoma. -His bowel issues have resolved with second line treatment .   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 Cannot r/o cutaneous involvement by mantle cell lymphoma though this is less likely .  Plan - patient knows that he is had this previously and steroids didn't help . -Recommended over-the-counter [place] ointment -Hydroxyzine 25 mg every 6 hours when necessary prescribed for use as necessary for pruritus . -Recommended using daily moisturizer and avoiding excessive hot showers.  6) Patient Active Problem List   Diagnosis Date Noted   Counseling regarding advanced care planning and goals of care 03/13/2017   Rectal mass 03/02/2017   Colon cancer metastasized to liver and lung(HCC) 03/02/2017   Rectal bleeding 02/28/2017   Portacath in place 01/30/2016   Skin lesion of chest wall 12/19/2015   Shingles 12/19/2015   Rash 11/28/2015   Legally blind 11/04/2015   Mantle cell lymphoma of lymph nodes of multiple regions ([place]) 11/01/2015   Protein-calorie malnutrition ([place]) 11/01/2015   Abscess of arm, right 10/27/2015   Lymphoma of lymph nodes of multiple sites ([place]) 10/27/2015   Shortness of breath 10/27/2015   Shortness of breath dyspnea 10/27/2015   Hypoalbuminemia 10/25/2015   Elevated lipase 10/25/2015   Inguinal hernia 10/25/2015   Urinary retention 10/25/2015   Abdominal pain    Lymphadenopathy 10/24/2015   -continue f/u with PCP for other continued medical issues.  Schedule remaining 2 cycles of BR every 4 weeks.  Return to clinic with Dr Irene Limbo in 4wks with repeat labs.  -F/u PET/CT as scheduled on 9/12.   I spent 20 minutes counseling the patient face to face. The total time spent in the appointment was 25 minutes and more than 50% was on counseling and direct patient cares. Patient was accompanied by his grand-daughter for this visit .     Kristopher Lone MD [place] AAHIVMS [facility] [place] [facility] Hematology/Oncology Physician [place]  (Office):       [phone] (Work cell):  [phone] (Fax):           [phone]  This document serves as a record of services personally performed by Kristopher Lone, MD. It was created on his  behalf by Reola Mosher, a trained medical scribe. The creation of this record is based on the scribe's personal observations and the provider's statements to them. This document has been checked and approved by the attending provider.

## 2017-06-11 NOTE — Progress Notes (Signed)
Pt not yet scheduled for PET scan that was to be reviewed with doctor today. Called Central Scheduling and PET scan appt made for 9/12 at 1400. Pt aware to arrive at 1330 for appt and not to eat or drink anything from 8am until 2pm that day. Pt chart states that he is diabetic, but no complications and is not currently taking any medications for diabetes.

## 2017-06-12 ENCOUNTER — Ambulatory Visit (HOSPITAL_BASED_OUTPATIENT_CLINIC_OR_DEPARTMENT_OTHER): Payer: Medicare Other

## 2017-06-12 ENCOUNTER — Telehealth: Payer: Self-pay | Admitting: Hematology

## 2017-06-12 DIAGNOSIS — C8318 Mantle cell lymphoma, lymph nodes of multiple sites: Secondary | ICD-10-CM

## 2017-06-12 DIAGNOSIS — Z5111 Encounter for antineoplastic chemotherapy: Secondary | ICD-10-CM

## 2017-06-12 DIAGNOSIS — Z7189 Other specified counseling: Secondary | ICD-10-CM

## 2017-06-12 MED ORDER — SODIUM CHLORIDE 0.9% FLUSH
10.0000 mL | INTRAVENOUS | Status: DC | PRN
  Administered 2017-06-12: 10 mL
  Filled 2017-06-12: qty 10

## 2017-06-12 MED ORDER — SODIUM CHLORIDE 0.9 % IV SOLN
Freq: Once | INTRAVENOUS | Status: AC
  Administered 2017-06-12: 15:00:00 via INTRAVENOUS

## 2017-06-12 MED ORDER — HEPARIN SOD (PORK) LOCK FLUSH 100 UNIT/ML IV SOLN
500.0000 [IU] | Freq: Once | INTRAVENOUS | Status: AC | PRN
  Administered 2017-06-12: 500 [IU]
  Filled 2017-06-12: qty 5

## 2017-06-12 MED ORDER — DEXAMETHASONE SODIUM PHOSPHATE 10 MG/ML IJ SOLN
10.0000 mg | Freq: Once | INTRAMUSCULAR | Status: AC
  Administered 2017-06-12: 10 mg via INTRAVENOUS

## 2017-06-12 MED ORDER — SODIUM CHLORIDE 0.9 % IV SOLN
90.0000 mg/m2 | Freq: Once | INTRAVENOUS | Status: AC
  Administered 2017-06-12: 200 mg via INTRAVENOUS
  Filled 2017-06-12: qty 8

## 2017-06-12 MED ORDER — DEXAMETHASONE SODIUM PHOSPHATE 10 MG/ML IJ SOLN
INTRAMUSCULAR | Status: AC
  Filled 2017-06-12: qty 1

## 2017-06-12 NOTE — Patient Instructions (Signed)
[facility] Discharge Instructions for Patients Receiving Chemotherapy  Today you received the following chemotherapy agents bendeka  To help prevent nausea and vomiting after your treatment, we encourage you to take your nausea medication as needed   If you develop nausea and vomiting that is not controlled by your nausea medication, call the clinic.   BELOW ARE SYMPTOMS THAT SHOULD BE REPORTED IMMEDIATELY:  *FEVER GREATER THAN 100.5 F  *CHILLS WITH OR WITHOUT FEVER  NAUSEA AND VOMITING THAT IS NOT CONTROLLED WITH YOUR NAUSEA MEDICATION  *UNUSUAL SHORTNESS OF BREATH  *UNUSUAL BRUISING OR BLEEDING  TENDERNESS IN MOUTH AND THROAT WITH OR WITHOUT PRESENCE OF ULCERS  *URINARY PROBLEMS  *BOWEL PROBLEMS  UNUSUAL RASH Items with * indicate a potential emergency and should be followed up as soon as possible.  Feel free to call the clinic you have any questions or concerns. The clinic phone number is [phone].  Please show the [place] at check-in to the Emergency Department and triage nurse.

## 2017-06-12 NOTE — Telephone Encounter (Signed)
Left message for patient regarding upcoming October appointments.  °

## 2017-06-19 ENCOUNTER — Ambulatory Visit (HOSPITAL_COMMUNITY)
Admission: RE | Admit: 2017-06-19 | Discharge: 2017-06-19 | Disposition: A | Discharge status: Home / Self Care | Payer: Medicare Other | Source: Ambulatory Visit | Attending: Hematology | Admitting: Hematology

## 2017-06-19 DIAGNOSIS — J439 Emphysema, unspecified: Secondary | ICD-10-CM | POA: Insufficient documentation

## 2017-06-19 DIAGNOSIS — C8313 Mantle cell lymphoma, intra-abdominal lymph nodes: Secondary | ICD-10-CM | POA: Diagnosis not present

## 2017-06-19 DIAGNOSIS — C8318 Mantle cell lymphoma, lymph nodes of multiple sites: Secondary | ICD-10-CM | POA: Insufficient documentation

## 2017-06-19 DIAGNOSIS — I700 Atherosclerosis of aorta: Secondary | ICD-10-CM | POA: Insufficient documentation

## 2017-06-19 DIAGNOSIS — K4020 Bilateral inguinal hernia, without obstruction or gangrene, not specified as recurrent: Secondary | ICD-10-CM | POA: Diagnosis not present

## 2017-06-19 DIAGNOSIS — I2510 Atherosclerotic heart disease of native coronary artery without angina pectoris: Secondary | ICD-10-CM | POA: Insufficient documentation

## 2017-06-19 LAB — GLUCOSE, CAPILLARY: GLUCOSE-CAPILLARY: 182 mg/dL — AB (ref 65–99)

## 2017-06-19 MED ORDER — FLUDEOXYGLUCOSE F - 18 (FDG) INJECTION
10.8200 | Freq: Once | INTRAVENOUS | Status: AC | PRN
  Administered 2017-06-19: 10.82 via INTRAVENOUS

## 2017-07-09 ENCOUNTER — Ambulatory Visit (HOSPITAL_BASED_OUTPATIENT_CLINIC_OR_DEPARTMENT_OTHER): Payer: Medicare Other | Admitting: Hematology

## 2017-07-09 ENCOUNTER — Telehealth: Payer: Self-pay | Admitting: Hematology

## 2017-07-09 ENCOUNTER — Ambulatory Visit (HOSPITAL_BASED_OUTPATIENT_CLINIC_OR_DEPARTMENT_OTHER): Payer: Medicare Other

## 2017-07-09 VITALS — BP 136/77 | HR 75 | Temp 98.5°F | Resp 17

## 2017-07-09 DIAGNOSIS — Z720 Tobacco use: Secondary | ICD-10-CM

## 2017-07-09 DIAGNOSIS — C8318 Mantle cell lymphoma, lymph nodes of multiple sites: Secondary | ICD-10-CM | POA: Diagnosis not present

## 2017-07-09 DIAGNOSIS — R059 Cough, unspecified: Secondary | ICD-10-CM

## 2017-07-09 DIAGNOSIS — Z7189 Other specified counseling: Secondary | ICD-10-CM

## 2017-07-09 DIAGNOSIS — Z5111 Encounter for antineoplastic chemotherapy: Secondary | ICD-10-CM | POA: Diagnosis not present

## 2017-07-09 DIAGNOSIS — R05 Cough: Secondary | ICD-10-CM | POA: Diagnosis not present

## 2017-07-09 DIAGNOSIS — Z5112 Encounter for antineoplastic immunotherapy: Secondary | ICD-10-CM | POA: Diagnosis not present

## 2017-07-09 LAB — COMPREHENSIVE METABOLIC PANEL
ALK PHOS: 79 U/L (ref 40–150)
ALT: 21 U/L (ref 0–55)
AST: 14 U/L (ref 5–34)
Albumin: 3.7 g/dL (ref 3.5–5.0)
Anion Gap: 8 mEq/L (ref 3–11)
BILIRUBIN TOTAL: 0.35 mg/dL (ref 0.20–1.20)
BUN: 11.6 mg/dL (ref 7.0–26.0)
CHLORIDE: 104 meq/L (ref 98–109)
CO2: 28 mEq/L (ref 22–29)
Calcium: 9.7 mg/dL (ref 8.4–10.4)
Creatinine: 0.9 mg/dL (ref 0.7–1.3)
EGFR: >90 mL/min/{1.73_m2} (ref >90)
GLUCOSE: 184 mg/dL — AB (ref 70–140)
POTASSIUM: 4.1 meq/L (ref 3.5–5.1)
SODIUM: 141 meq/L (ref 136–145)
Total Protein: 6.6 g/dL (ref 6.4–8.3)

## 2017-07-09 LAB — CBC WITH DIFFERENTIAL/PLATELET
BASO%: 0.7 % (ref 0.0–2.0)
BASOS ABS: 0.1 10*3/uL (ref 0.0–0.1)
EOS%: 4.5 % (ref 0.0–7.0)
Eosinophils Absolute: 0.4 10*3/uL (ref 0.0–0.5)
HCT: 43.4 % (ref 38.4–49.9)
HEMOGLOBIN: 13.9 g/dL (ref 13.0–17.1)
LYMPH#: 1.2 10*3/uL (ref 0.9–3.3)
LYMPH%: 13.1 % — ABNORMAL LOW (ref 14.0–49.0)
MCH: 30.1 pg (ref 27.2–33.4)
MCHC: 32 g/dL (ref 32.0–36.0)
MCV: 93.9 fL (ref 79.3–98.0)
MONO#: 0.4 10*3/uL (ref 0.1–0.9)
MONO%: 3.9 % (ref 0.0–14.0)
NEUT#: 7 10*3/uL — ABNORMAL HIGH (ref 1.5–6.5)
NEUT%: 77.8 % — ABNORMAL HIGH (ref 39.0–75.0)
Platelets: 126 10*3/uL — ABNORMAL LOW (ref 140–400)
RBC: 4.62 10*6/uL (ref 4.20–5.82)
RDW: 15.6 % — AB (ref 11.0–14.6)
WBC: 8.9 10*3/uL (ref 4.0–10.3)

## 2017-07-09 LAB — TECHNOLOGIST REVIEW: Technologist Review: 1

## 2017-07-09 MED ORDER — DEXAMETHASONE SODIUM PHOSPHATE 10 MG/ML IJ SOLN
10.0000 mg | Freq: Once | INTRAMUSCULAR | Status: AC
  Administered 2017-07-09: 10 mg via INTRAVENOUS

## 2017-07-09 MED ORDER — SODIUM CHLORIDE 0.9% FLUSH
10.0000 mL | INTRAVENOUS | Status: DC | PRN
  Administered 2017-07-09: 10 mL
  Filled 2017-07-09: qty 10

## 2017-07-09 MED ORDER — SODIUM CHLORIDE 0.9 % IV SOLN
90.0000 mg/m2 | Freq: Once | INTRAVENOUS | Status: AC
  Administered 2017-07-09: 200 mg via INTRAVENOUS
  Filled 2017-07-09: qty 8

## 2017-07-09 MED ORDER — SODIUM CHLORIDE 0.9 % IV SOLN
Freq: Once | INTRAVENOUS | Status: AC
  Administered 2017-07-09: 13:00:00 via INTRAVENOUS

## 2017-07-09 MED ORDER — DEXAMETHASONE SODIUM PHOSPHATE 10 MG/ML IJ SOLN
INTRAMUSCULAR | Status: AC
  Filled 2017-07-09: qty 1

## 2017-07-09 MED ORDER — SODIUM CHLORIDE 0.9 % IV SOLN
375.0000 mg/m2 | Freq: Once | INTRAVENOUS | Status: AC
  Administered 2017-07-09: 800 mg via INTRAVENOUS
  Filled 2017-07-09: qty 30

## 2017-07-09 MED ORDER — ACETAMINOPHEN 325 MG PO TABS
ORAL_TABLET | ORAL | Status: AC
  Filled 2017-07-09: qty 2

## 2017-07-09 MED ORDER — HEPARIN SOD (PORK) LOCK FLUSH 100 UNIT/ML IV SOLN
500.0000 [IU] | Freq: Once | INTRAVENOUS | Status: AC | PRN
  Administered 2017-07-09: 500 [IU]
  Filled 2017-07-09: qty 5

## 2017-07-09 MED ORDER — PALONOSETRON HCL INJECTION 0.25 MG/5ML
0.2500 mg | Freq: Once | INTRAVENOUS | Status: AC
  Administered 2017-07-09: 0.25 mg via INTRAVENOUS

## 2017-07-09 MED ORDER — DIPHENHYDRAMINE HCL 25 MG PO CAPS
ORAL_CAPSULE | ORAL | Status: AC
  Filled 2017-07-09: qty 2

## 2017-07-09 MED ORDER — ACETAMINOPHEN 325 MG PO TABS
650.0000 mg | ORAL_TABLET | Freq: Once | ORAL | Status: AC
  Administered 2017-07-09: 650 mg via ORAL

## 2017-07-09 MED ORDER — PALONOSETRON HCL INJECTION 0.25 MG/5ML
INTRAVENOUS | Status: AC
Start: 2017-07-09 — End: ?
  Filled 2017-07-09: qty 5

## 2017-07-09 MED ORDER — DIPHENHYDRAMINE HCL 25 MG PO CAPS
50.0000 mg | ORAL_CAPSULE | Freq: Once | ORAL | Status: AC
  Administered 2017-07-09: 50 mg via ORAL

## 2017-07-09 NOTE — Patient Instructions (Signed)
[facility] Discharge Instructions for Patients Receiving Chemotherapy  Today you received the following chemotherapy agents: Rituxan and Bendeka.   To help prevent nausea and vomiting after your treatment, we encourage you to take your nausea medication as prescribed.    If you develop nausea and vomiting that is not controlled by your nausea medication, call the clinic.   BELOW ARE SYMPTOMS THAT SHOULD BE REPORTED IMMEDIATELY:  *FEVER GREATER THAN 100.5 F  *CHILLS WITH OR WITHOUT FEVER  NAUSEA AND VOMITING THAT IS NOT CONTROLLED WITH YOUR NAUSEA MEDICATION  *UNUSUAL SHORTNESS OF BREATH  *UNUSUAL BRUISING OR BLEEDING  TENDERNESS IN MOUTH AND THROAT WITH OR WITHOUT PRESENCE OF ULCERS  *URINARY PROBLEMS  *BOWEL PROBLEMS  UNUSUAL RASH Items with * indicate a potential emergency and should be followed up as soon as possible.  Feel free to call the clinic you have any questions or concerns. The clinic phone number is [phone].  Please show the [place] at check-in to the Emergency Department and triage nurse.

## 2017-07-09 NOTE — Progress Notes (Signed)
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 Patient chooses not to pursue AutoHSCT based strategies which is quite reasonable considering his functional challenges with legal blindness and limited social support.   HISTORY OF PRESENTING ILLNESS: plz see clinic note for details on initial presentation  Interval History Of note since the patient last visit, he had a PET scan completed on 06/19/2017 with results revealing: IMPRESSION: 1. Significant partial treatment response. Mild residual metabolically active lymphoma in the mediastinum, left pleural space, presacral / left pelvic nodes and rectosigmoid colon, all reduced in size and metabolism. No new metabolically active sites of disease. 2. Stable large bilateral inguinal hernias, right greater than left, both containing pelvic small bowel loops, with no evidence of bowel ischemia or obstruction. 3. Aortic Atherosclerosis (ICD10-I70.0) and Emphysema (ICD10-J43.9). Coronary atherosclerosis.  Kristopher Johnson is here for follow-up of his mantle cell lymphoma and prior to his 5th cycle of bendamustine and Rituxan. He has tolerated his first 4 cycles of Bendamustine and Rituxan without any overt prohibitive toxicities. He is accompanied by his grand-daughter. Overall he is doing well. But reports nodular areas to his bilateral forearms occurring 3 weeks ago. He states that he typically has these pruritic nodular areas appear 1 week and has no prohibitive new toxicities.   He has a chronic cough related to post  nasal drip. [name] Kitchen Pt reports he smokes about 1 PPD of cigarettes. He notes that he is still in the process of moving and that he has had to postpone this due to his chemotherapy treatments.   On review of systems, he denies fever, chills, shortness of breath, bloody stools, diarrhea, constipation, bowel frequency, or any other associated complaints.    PAST MEDICAL HISTORY:  Past Medical History:  Diagnosis Date  . Abscess of arm, right 10/27/2015  . Lymphoma of lymph nodes of multiple sites ([place]) 10/27/2015  . 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 Shortness of breath 10/27/2015    SURGICAL HISTORY: Past Surgical History:  Procedure Laterality Date  . Cataract surgery     Patient notes she has had bilateral Surgery in 1998 and 99  . FLEXIBLE SIGMOIDOSCOPY N/A 03/01/2017   Procedure: FLEXIBLE SIGMOIDOSCOPY;  Surgeon: Manus Gunning, MD;  Location: [facility];  Service: Gastroenterology;  Laterality: N/A;  . TONSILLECTOMY     about 61years old    SOCIAL HISTORY: Social History   Social History  . Marital status: Widowed    Spouse name: N/A  . Number of children: N/A  . Years of education: N/A   Occupational History  . Not on file.   Social History Main Topics  . Smoking status: Current Every Day Smoker    Packs/day: 1.00    Years: 33.00  . Smokeless tobacco: Never Used  . Alcohol use No  . Drug use: No  . Sexual activity: Not on file     Comment: Disabled, retinis pigmentosa legally blind, 4children MPOA Kristopher Johnson(cousin)   Other Topics Concern  . Not  on file   Social History Narrative  . No narrative on file    FAMILY HISTORY: Family History  Problem Relation Age of Onset  . Colon cancer Neg Hx     ALLERGIES:  has No Known Allergies.  MEDICATIONS:  Current Outpatient Prescriptions  Medication Sig Dispense Refill  . acyclovir (ZOVIRAX) 400 MG tablet Take 1 tablet  (400 mg total) by mouth daily. (Patient not taking: Reported on 04/16/2017) 30 tablet 3  . allopurinol (ZYLOPRIM) 100 MG tablet Take 1 tablet (100 mg total) by mouth 2 (two) times daily. (Patient not taking: Reported on 04/16/2017) 60 tablet 0  . dexamethasone (DECADRON) 4 MG tablet Take 2 tablets (8 mg total) by mouth daily. Start the day after bendamustine chemotherapy for 2 days. Take with food. (Patient not taking: Reported on 04/16/2017) 30 tablet 1  . hydrOXYzine (ATARAX/VISTARIL) 25 MG tablet Take 1 tablet (25 mg total) by mouth every 6 (six) hours as needed for itching. 50 tablet 0  . lidocaine-prilocaine (EMLA) cream Apply 1 application topically as needed. (Patient not taking: Reported on 11/06/2016) 30 g 6   No current facility-administered medications for this visit.     REVIEW OF SYSTEMS:    10 Point review of Systems was done is negative except as noted above.  PHYSICAL EXAMINATION:  ECOG PERFORMANCE STATUS: 2 - Symptomatic, <50% confined to bed .VS as noted in [place], in no acute distress and comfortable SKIN: some nodular skin lesions over extremities -much improved OROPHARYNX:no exudate, no erythema and lips, multiple carious teeth with significant periodontal disease. NECK: supple, no JVD, thyroid normal size, non-tender, without nodularity LYMPH:  no palpable lymphadenopathy in the cervical, axillary or inguinal LUNGS: clear to auscultation with normal respiratory effort HEART: regular rate & rhythm,  no murmurs and no lower extremity edema ABDOMEN: abdomen soft, non-tender, normoactive bowel sounds  Musculoskeletal: no cyanosis of digits and no clubbing  PSYCH: alert & oriented x 3 with fluent speech NEURO: no focal motor/sensory deficits  LABORATORY DATA:  I have reviewed the data as listed   CBC Latest Ref Rng & Units 07/09/2017 06/11/2017 05/14/2017  WBC 4.0 - 10.3 10e3/uL 8.9 7.9 6.9  Hemoglobin 13.0 - 17.1 g/dL 13.9 14.3 13.2  Hematocrit 38.4 - 49.9 %  43.4 44.7 41.1  Platelets 140 - 400 10e3/uL 126(L) 142 151    . CMP Latest Ref Rng & Units 07/09/2017 06/11/2017 05/14/2017  Glucose 70 - 140 mg/dl 184(H) 198(H) 163(H)  BUN 7.0 - 26.0 mg/dL 11.6 13.3 11.1  Creatinine 0.7 - 1.3 mg/dL 0.9 1.0 0.9  Sodium 136 - 145 mEq/L 141 140 141  Potassium 3.5 - 5.1 mEq/L 4.1 3.9 3.6  Chloride 101 - 111 mmol/L - - -  CO2 22 - 29 mEq/L 28 30(H) 29  Calcium 8.4 - 10.4 mg/dL 9.7 9.9 9.4  Total Protein 6.4 - 8.3 g/dL 6.6 6.7 6.1(L)  Total Bilirubin 0.20 - 1.20 mg/dL 0.35 0.36 0.36  Alkaline Phos 40 - 150 U/L 79 79 82  AST 5 - 34 U/L 14 13 13   ALT 0 - 55 U/L 21 18 14     Lab Results  Component Value Date   LDH 144 06/11/2017     RADIOGRAPHIC STUDIES: I have personally reviewed the radiological images as listed and agreed with the findings in the report. Nm Pet Image Restag (ps) Skull Base To Thigh  Result Date: 06/20/2017 CLINICAL DATA:  Subsequent treatment strategy for stage IV mantle cell lymphoma diagnosed 10/25/2015 on  active treatment. EXAM: NUCLEAR MEDICINE PET SKULL BASE TO THIGH TECHNIQUE: 10.8 mCi F-18 FDG was injected intravenously. Full-ring PET imaging was performed from the skull base to thigh after the radiotracer. CT data was obtained and used for attenuation correction and anatomic localization. FASTING BLOOD GLUCOSE:  Value: 182 mg/dl COMPARISON:  03/25/2017 PET-CT. FINDINGS: NECK: No hypermetabolic lymph nodes in the neck. CHEST: Mildly hypermetabolic 0.8 cm left paratracheal node posterior to the proximal aortic arch (series 4/image 67) with max SUV 3.4, previously 2.1 cm with max SUV 16.6, significantly decreased in size and metabolism. Otherwise no enlarged or hypermetabolic axillary, mediastinal or hilar lymph nodes. Scattered foci of left pleural hypermetabolism are significantly decreased. For example a 1.1 cm anterior left pleural nodule with max SUV 5.5 (series 4/image 67), previously 3.3 cm with max SUV 15.8. No residual right  pleural hypermetabolism. Mild centrilobular and paraseptal emphysema with diffuse bronchial wall thickening. No acute consolidative airspace disease, lung masses or significant pulmonary nodules. No pleural effusions. No pneumothorax. Right internal jugular MediPort terminates in the lower third of the superior vena cava. Left anterior descending and right coronary atherosclerosis. Stable non hypermetabolic 1.7 cm upper left paratracheal nodule, which may represent an exophytic posterior left thyroid lobe nodule. ABDOMEN/PELVIS: Significantly decreased wall thickening and hypermetabolism at the rectosigmoid junction, with max SUV 10.1, previous max SUV 26.1. Hypermetabolic 1.4 cm presacral node with max SUV 6.1 (series 4/image 164), previously 2.9 cm with max SUV 19.0, significantly decreased in size and metabolism. Mildly enlarged and mildly hypermetabolic 1.2 cm left external iliac lymph node with max SUV 3.8 (series 4/image 170), previously 1.7 cm with max SUV 18.9, significantly decreased in size and metabolism. No residual hypermetabolic peritoneal nodules. No new enlarged or hypermetabolic abdominopelvic nodes. No abnormal hypermetabolic activity within the liver, pancreas, adrenal glands, or spleen. No hypermetabolic lymph nodes in the abdomen or pelvis. Simple 3.3 cm lateral segment left liver lobe cyst. Atherosclerotic nonaneurysmal abdominal aorta. Large bilateral inguinal hernias, right greater than left, both containing pelvic small bowel loops. No small bowel wall thickening or dilatation. SKELETON: No focal hypermetabolic activity to suggest skeletal metastasis. IMPRESSION: 1. Significant partial treatment response. Mild residual metabolically active lymphoma in the mediastinum, left pleural space, presacral / left pelvic nodes and rectosigmoid colon, all reduced in size and metabolism. No new metabolically active sites of disease. 2. Stable large bilateral inguinal hernias, right greater than left,  both containing pelvic small bowel loops, with no evidence of bowel ischemia or obstruction. 3. Aortic Atherosclerosis (ICD10-I70.0) and Emphysema (ICD10-J43.9). Coronary atherosclerosis. Electronically Signed   By: Ilona Sorrel M.D.   On: 06/20/2017 09:11    ECHO: 11/01/2015: Study Conclusions  - Left ventricle: The cavity size was normal. There was mild concentric hypertrophy. Systolic function was normal. The estimated ejection fraction was in the range of 50% to 55%. Wall motion was normal; there were no regional wall motion abnormalities. Doppler parameters are consistent with abnormal left ventricular relaxation (grade 1 diastolic dysfunction). - Aortic valve: Transvalvular velocity was within the normal range. There was no stenosis. There was no regurgitation. - Mitral valve: There was no regurgitation. - Right ventricle: The cavity size was normal. Wall thickness was normal. Systolic function was normal. - Tricuspid valve: There was no regurgitation. - Inferior vena cava: The vessel was normal in size. The respirophasic diameter changes were in the normal range (>= 50%), consistent with normal central venous pressure.Study Conclusions  - Left ventricle: The cavity size was normal. There was mild  concentric hypertrophy. Systolic function was normal. The estimated ejection fraction was in the range of 50% to 55%. Wall motion was normal; there were no regional wall motion abnormalities. Doppler parameters are consistent with abnormal left ventricular relaxation (grade 1 diastolic dysfunction). - Aortic valve: Transvalvular velocity was within the normal range. There was no stenosis. There was no regurgitation. - Mitral valve: There was no regurgitation. - Right ventricle: The cavity size was normal. Wall thickness was normal. Systolic function was normal. - Tricuspid valve: There was no regurgitation. - Inferior vena cava: The vessel was normal in  size. The respirophasic diameter changes were in the normal range (>= 50%), consistent with normal central venous pressure.  ASSESSMENT & PLAN:    61 yo man with   1) h/o previous Stage IVB E Mantle Cell lymphoma with likely gastric involvement, extensive LNadenopathy. ECHO nl EF ECOG PS 2 but activities significantly limited due to being legally blind HIV/Hep C/Hep B neg Baseline LDH 450--->135-->163-->144  PET/CT scan after 3 cycles of R CHOP show good overall response. Stomach lesion still with very FDG uptake ? Residual lymphoma versus inflammation.  Patient is status post 6 cycles of R CHOP. PET/CT after 6 cycles shows resolution of hypermetabolic lymphadenopathy and gastric wall activity . Patient was on maintenance Rituxan for 1 year now prior to relapse.   2) Relapsed Pleomorphic mantle cell lymphoma Presented with rectal bleeding and constipation with a large rectosigmoid mass and other evidence of colonic involvement. CT chest abdomen pelvis showed multiple areas of lymphadenopathy in the abdomen as well as in the chest suggesting significant recurrence. PET/CT scan 03/25/2017 shows diffuse involvement with mantle cell lymphoma involving the chest abdomen pelvis and bowels.  PLAN -Clinically he is doing well today,His bowel symptoms seemed to have resolved and he seems to be responding to treatment well. He seems more comfortable on my examination.  -labs are stable today and he is appropriate to proceed with 5th cycle of BR -PET scan on 06/19/2017 shows good response to treatment -He will f/u with [place] in 4 weeks for his last 6th cycle.  -Patient will have a repeat PET scan completed following completion of his 6th cycle.    3) Retinitis pigmentosa causing legal blindness -  is seeing a retina specialist in town. No acute interventions at this time. Notes that his vision is progressively getting worse. His daughter and granddaughter are helping him out at home. He was  encouraged to use some of the resources for the blind or through the retinitis pigmentosa [employer]   3) Weight loss and protein calorie malnutrition. Resolved. Weight has improved significantly since his diagnosis and back to his baseline weight.  Wt Readings from Last 3 Encounters:  06/11/17 200 lb 11.2 oz (91 kg)  05/14/17 200 lb 9.6 oz (91 kg)  04/16/17 198 lb (89.8 kg)   4) h/o Peri-orbital dermatitis /Conjuncitivitis - patient was seen by the dermatologist and given topical desonide ointment which led to the resolution of his dermatitis. There were not sure about the etiology but noted then to could not rule out Rituxan is a possibility. Currently his dermatitis has resolved and his conjunctivitis has resolved. He did not have rash anywhere else at the time. No oral sores or other toxicities. This happened about 4 days after Rituxan another chemotherapy. This has completely resolved and patient has not had an further issues with this with several additional doses of Rituxan.  5) Patient Active Problem List   Diagnosis Date Noted  .  Counseling regarding advanced care planning and goals of care 03/13/2017  . Rectal mass 03/02/2017  . Colon cancer metastasized to liver and lung([facility]) 03/02/2017  . Rectal bleeding 02/28/2017  . Portacath in place 01/30/2016  . Skin lesion of chest wall 12/19/2015  . Shingles 12/19/2015  . Rash 11/28/2015  . 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 Mantle cell lymphoma of lymph nodes of multiple regions ([place]) 11/01/2015  . Protein-calorie malnutrition ([place]) 11/01/2015  . Abscess of arm, right 10/27/2015  . Lymphoma of lymph nodes of multiple sites ([place]) 10/27/2015  . Shortness of breath 10/27/2015  . Shortness of breath dyspnea 10/27/2015  . Hypoalbuminemia 10/25/2015  . Elevated lipase 10/25/2015  . Inguinal hernia 10/25/2015  . Urinary retention 10/25/2015  . Abdominal pain   . Lymphadenopathy 10/24/2015   -continue f/u with PCP for other continued medical  issues.  -please schedule C6 of Bendamustine Rituxan as per orders in 4 weeks RTC with Dr Irene Limbo in 4 weeks with labs   I spent 20 minutes counseling the patient face to face. The total time spent in the appointment was 25 minutes and more than 50% was on counseling and direct patient cares. Patient was accompanied by his grand-daughter for this visit .    Sullivan Lone MD [place] AAHIVMS [facility] Hematology/Oncology Physician [place]  (Office):       [phone] (Work cell):  [phone] (Fax):           [phone]  This document serves as a record of services personally performed by Sullivan Lone, MD. It was created on his behalf by Alean Rinne, a trained medical scribe. The creation of this record is based on the scribe's personal observations and the provider's statements to them. This document has been checked and approved by the attending provider.

## 2017-07-09 NOTE — Telephone Encounter (Signed)
Appointments complete per 10/2 los. Left message for patient and patient to get updated scheduled at tomorrow's visit.

## 2017-07-10 ENCOUNTER — Other Ambulatory Visit: Payer: Medicare Other

## 2017-07-10 ENCOUNTER — Ambulatory Visit: Payer: Medicare Other | Admitting: Hematology

## 2017-07-10 ENCOUNTER — Ambulatory Visit (HOSPITAL_BASED_OUTPATIENT_CLINIC_OR_DEPARTMENT_OTHER): Payer: Medicare Other

## 2017-07-10 VITALS — BP 126/71 | HR 83 | Temp 98.3°F | Resp 18

## 2017-07-10 DIAGNOSIS — Z5111 Encounter for antineoplastic chemotherapy: Secondary | ICD-10-CM

## 2017-07-10 DIAGNOSIS — C8318 Mantle cell lymphoma, lymph nodes of multiple sites: Secondary | ICD-10-CM | POA: Diagnosis not present

## 2017-07-10 DIAGNOSIS — Z7189 Other specified counseling: Secondary | ICD-10-CM

## 2017-07-10 MED ORDER — SODIUM CHLORIDE 0.9 % IV SOLN
Freq: Once | INTRAVENOUS | Status: AC
  Administered 2017-07-10: 15:00:00 via INTRAVENOUS

## 2017-07-10 MED ORDER — DEXAMETHASONE SODIUM PHOSPHATE 10 MG/ML IJ SOLN
INTRAMUSCULAR | Status: AC
  Filled 2017-07-10: qty 1

## 2017-07-10 MED ORDER — SODIUM CHLORIDE 0.9 % IV SOLN
90.0000 mg/m2 | Freq: Once | INTRAVENOUS | Status: AC
  Administered 2017-07-10: 200 mg via INTRAVENOUS
  Filled 2017-07-10: qty 8

## 2017-07-10 MED ORDER — HEPARIN SOD (PORK) LOCK FLUSH 100 UNIT/ML IV SOLN
500.0000 [IU] | Freq: Once | INTRAVENOUS | Status: AC | PRN
  Administered 2017-07-10: 500 [IU]
  Filled 2017-07-10: qty 5

## 2017-07-10 MED ORDER — DEXAMETHASONE SODIUM PHOSPHATE 10 MG/ML IJ SOLN
10.0000 mg | Freq: Once | INTRAMUSCULAR | Status: AC
  Administered 2017-07-10: 10 mg via INTRAVENOUS

## 2017-07-10 MED ORDER — SODIUM CHLORIDE 0.9% FLUSH
10.0000 mL | INTRAVENOUS | Status: DC | PRN
  Administered 2017-07-10: 10 mL
  Filled 2017-07-10: qty 10

## 2017-07-10 NOTE — Patient Instructions (Signed)
[facility] Discharge Instructions for Patients Receiving Chemotherapy  Today you received the following chemotherapy agents [place]a.  To help prevent nausea and vomiting after your treatment, we encourage you to take your nausea medication as directed.   If you develop nausea and vomiting that is not controlled by your nausea medication, call the clinic.   BELOW ARE SYMPTOMS THAT SHOULD BE REPORTED IMMEDIATELY:  *FEVER GREATER THAN 100.5 F  *CHILLS WITH OR WITHOUT FEVER  NAUSEA AND VOMITING THAT IS NOT CONTROLLED WITH YOUR NAUSEA MEDICATION  *UNUSUAL SHORTNESS OF BREATH  *UNUSUAL BRUISING OR BLEEDING  TENDERNESS IN MOUTH AND THROAT WITH OR WITHOUT PRESENCE OF ULCERS  *URINARY PROBLEMS  *BOWEL PROBLEMS  UNUSUAL RASH Items with * indicate a potential emergency and should be followed up as soon as possible.  Feel free to call the clinic should you have any questions or concerns. The clinic phone number is [phone].  Please show the CHEMO ALERT CARD at check-in to the Emergency Department and triage nurse.   

## 2017-07-18 ENCOUNTER — Encounter: Payer: Self-pay | Admitting: *Deleted

## 2017-08-06 ENCOUNTER — Ambulatory Visit (HOSPITAL_BASED_OUTPATIENT_CLINIC_OR_DEPARTMENT_OTHER): Payer: Medicare Other

## 2017-08-06 ENCOUNTER — Ambulatory Visit (HOSPITAL_BASED_OUTPATIENT_CLINIC_OR_DEPARTMENT_OTHER): Payer: Medicare Other | Admitting: Hematology

## 2017-08-06 ENCOUNTER — Other Ambulatory Visit (HOSPITAL_BASED_OUTPATIENT_CLINIC_OR_DEPARTMENT_OTHER): Payer: Medicare Other

## 2017-08-06 ENCOUNTER — Encounter: Payer: Self-pay | Admitting: Hematology

## 2017-08-06 VITALS — BP 139/75 | HR 82 | Temp 98.2°F | Resp 18

## 2017-08-06 VITALS — BP 125/72 | HR 72 | Temp 97.9°F | Resp 18 | Ht 70.0 in | Wt 203.5 lb

## 2017-08-06 DIAGNOSIS — Z5111 Encounter for antineoplastic chemotherapy: Secondary | ICD-10-CM

## 2017-08-06 DIAGNOSIS — Z5112 Encounter for antineoplastic immunotherapy: Secondary | ICD-10-CM

## 2017-08-06 DIAGNOSIS — Z7189 Other specified counseling: Secondary | ICD-10-CM

## 2017-08-06 DIAGNOSIS — Z720 Tobacco use: Secondary | ICD-10-CM | POA: Diagnosis not present

## 2017-08-06 DIAGNOSIS — C8318 Mantle cell lymphoma, lymph nodes of multiple sites: Secondary | ICD-10-CM

## 2017-08-06 LAB — COMPREHENSIVE METABOLIC PANEL
ALBUMIN: 3.6 g/dL (ref 3.5–5.0)
ALK PHOS: 75 U/L (ref 40–150)
ALT: 18 U/L (ref 0–55)
AST: 13 U/L (ref 5–34)
Anion Gap: 8 mEq/L (ref 3–11)
BUN: 7.5 mg/dL (ref 7.0–26.0)
CO2: 28 mEq/L (ref 22–29)
Calcium: 9.3 mg/dL (ref 8.4–10.4)
Chloride: 104 mEq/L (ref 98–109)
Creatinine: 0.9 mg/dL (ref 0.7–1.3)
GLUCOSE: 226 mg/dL — AB (ref 70–140)
POTASSIUM: 3.6 meq/L (ref 3.5–5.1)
Sodium: 139 mEq/L (ref 136–145)
Total Bilirubin: 0.48 mg/dL (ref 0.20–1.20)
Total Protein: 6.4 g/dL (ref 6.4–8.3)

## 2017-08-06 LAB — CBC & DIFF AND RETIC
BASO%: 0.6 % (ref 0.0–2.0)
Basophils Absolute: 0 10*3/uL (ref 0.0–0.1)
EOS%: 6 % (ref 0.0–7.0)
Eosinophils Absolute: 0.4 10*3/uL (ref 0.0–0.5)
HCT: 44.4 % (ref 38.4–49.9)
HGB: 14.2 g/dL (ref 13.0–17.1)
IMMATURE RETIC FRACT: 4.7 % (ref 3.00–10.60)
LYMPH#: 0.9 10*3/uL (ref 0.9–3.3)
LYMPH%: 13.4 % — AB (ref 14.0–49.0)
MCH: 30.3 pg (ref 27.2–33.4)
MCHC: 32 g/dL (ref 32.0–36.0)
MCV: 94.7 fL (ref 79.3–98.0)
MONO#: 0.3 10*3/uL (ref 0.1–0.9)
MONO%: 4.1 % (ref 0.0–14.0)
NEUT%: 75.9 % — ABNORMAL HIGH (ref 39.0–75.0)
NEUTROS ABS: 5.4 10*3/uL (ref 1.5–6.5)
Platelets: 126 10*3/uL — ABNORMAL LOW (ref 140–400)
RBC: 4.69 10*6/uL (ref 4.20–5.82)
RDW: 14.7 % — AB (ref 11.0–14.6)
Retic %: 1.18 % (ref 0.80–1.80)
Retic Ct Abs: 55.34 10*3/uL (ref 34.80–93.90)
WBC: 7 10*3/uL (ref 4.0–10.3)

## 2017-08-06 MED ORDER — HEPARIN SOD (PORK) LOCK FLUSH 100 UNIT/ML IV SOLN
500.0000 [IU] | Freq: Once | INTRAVENOUS | Status: AC | PRN
  Administered 2017-08-06: 500 [IU]
  Filled 2017-08-06: qty 5

## 2017-08-06 MED ORDER — DIPHENHYDRAMINE HCL 25 MG PO CAPS
ORAL_CAPSULE | ORAL | Status: AC
  Filled 2017-08-06: qty 2

## 2017-08-06 MED ORDER — PALONOSETRON HCL INJECTION 0.25 MG/5ML
0.2500 mg | Freq: Once | INTRAVENOUS | Status: AC
  Administered 2017-08-06: 0.25 mg via INTRAVENOUS

## 2017-08-06 MED ORDER — SODIUM CHLORIDE 0.9% FLUSH
10.0000 mL | INTRAVENOUS | Status: DC | PRN
  Administered 2017-08-06: 10 mL
  Filled 2017-08-06: qty 10

## 2017-08-06 MED ORDER — SODIUM CHLORIDE 0.9 % IV SOLN
375.0000 mg/m2 | Freq: Once | INTRAVENOUS | Status: AC
  Administered 2017-08-06: 800 mg via INTRAVENOUS
  Filled 2017-08-06: qty 50

## 2017-08-06 MED ORDER — SODIUM CHLORIDE 0.9 % IV SOLN
90.0000 mg/m2 | Freq: Once | INTRAVENOUS | Status: AC
  Administered 2017-08-06: 200 mg via INTRAVENOUS
  Filled 2017-08-06: qty 8

## 2017-08-06 MED ORDER — DEXAMETHASONE SODIUM PHOSPHATE 10 MG/ML IJ SOLN
INTRAMUSCULAR | Status: AC
  Filled 2017-08-06: qty 1

## 2017-08-06 MED ORDER — DEXAMETHASONE SODIUM PHOSPHATE 10 MG/ML IJ SOLN
10.0000 mg | Freq: Once | INTRAMUSCULAR | Status: AC
  Administered 2017-08-06: 10 mg via INTRAVENOUS

## 2017-08-06 MED ORDER — SODIUM CHLORIDE 0.9 % IV SOLN
Freq: Once | INTRAVENOUS | Status: AC
  Administered 2017-08-06: 11:00:00 via INTRAVENOUS

## 2017-08-06 MED ORDER — PALONOSETRON HCL INJECTION 0.25 MG/5ML
INTRAVENOUS | Status: AC
  Filled 2017-08-06: qty 5

## 2017-08-06 MED ORDER — DIPHENHYDRAMINE HCL 25 MG PO CAPS
50.0000 mg | ORAL_CAPSULE | Freq: Once | ORAL | Status: AC
  Administered 2017-08-06: 50 mg via ORAL

## 2017-08-06 MED ORDER — ACETAMINOPHEN 325 MG PO TABS
ORAL_TABLET | ORAL | Status: AC
  Filled 2017-08-06: qty 2

## 2017-08-06 MED ORDER — ACETAMINOPHEN 325 MG PO TABS
650.0000 mg | ORAL_TABLET | Freq: Once | ORAL | Status: AC
  Administered 2017-08-06: 650 mg via ORAL

## 2017-08-06 NOTE — Patient Instructions (Signed)
[facility] Discharge Instructions for Patients Receiving Chemotherapy  Today you received the following chemotherapy agents Rituxan and Bendamustine  To help prevent nausea and vomiting after your treatment, we encourage you to take your nausea medication as directed If you develop nausea and vomiting that is not controlled by your nausea medication, call the clinic.   BELOW ARE SYMPTOMS THAT SHOULD BE REPORTED IMMEDIATELY:  *FEVER GREATER THAN 100.5 F  *CHILLS WITH OR WITHOUT FEVER  NAUSEA AND VOMITING THAT IS NOT CONTROLLED WITH YOUR NAUSEA MEDICATION  *UNUSUAL SHORTNESS OF BREATH  *UNUSUAL BRUISING OR BLEEDING  TENDERNESS IN MOUTH AND THROAT WITH OR WITHOUT PRESENCE OF ULCERS  *URINARY PROBLEMS  *BOWEL PROBLEMS  UNUSUAL RASH Items with * indicate a potential emergency and should be followed up as soon as possible.  Feel free to call the clinic should you have any questions or concerns. The clinic phone number is [phone].  Please show the [place] at check-in to the Emergency Department and triage nurse.

## 2017-08-06 NOTE — Progress Notes (Signed)
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 Patient chooses not to pursue AutoHSCT based strategies which is quite reasonable considering his functional challenges with legal blindness and limited social support.   HISTORY OF PRESENTING ILLNESS: plz see clinic note for details on initial presentation  Interval History:  Kristopher Johnson presents today for follow up and his sixth and final cycle of bendamustine and Rituxan. Overall, he reports that he is doing well overall and tolerated his other five cycles without prohibitive toxicities. He tolerated his last cycle well. He did have the appearance of several  nodular areas to his bilateral forearms again with his last cycle, but these resolved over time and have not recurred since. None present currently.  On review of systems, he denies fever, chills, weight loss, shortness of breath, bloody stools, diarrhea, constipation, bowel frequency, issues with his port, mucositis, or any other associated complaints.   PAST MEDICAL HISTORY:  Past Medical History:  Diagnosis Date  . Abscess of arm, right 10/27/2015  . Lymphoma of lymph nodes of multiple sites ([place]) 10/27/2015  . 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 Shortness of breath 10/27/2015    SURGICAL HISTORY: Past Surgical History:  Procedure Laterality Date  . Cataract surgery     Patient notes she has had bilateral Surgery in 1998 and 99  . FLEXIBLE  SIGMOIDOSCOPY N/A 03/01/2017   Procedure: FLEXIBLE SIGMOIDOSCOPY;  Surgeon: Manus Gunning, MD;  Location: [facility];  Service: Gastroenterology;  Laterality: N/A;  . TONSILLECTOMY     about 61years old    SOCIAL HISTORY: Social History   Social History  . Marital status: Widowed    Spouse name: N/A  . Number of children: N/A  . Years of education: N/A   Occupational History  . Not on file.   Social History Main Topics  . Smoking status: Current Every Day Smoker    Packs/day: 1.00    Years: 33.00  . Smokeless tobacco: Never Used  . Alcohol use No  . Drug use: No  . Sexual activity: Not on file     Comment: Disabled, retinis pigmentosa legally blind, 4children MPOA Lynnn(cousin)   Other Topics Concern  . Not on file   Social History Narrative  . No narrative on file    FAMILY HISTORY: Family History  Problem Relation Age of Onset  . Colon cancer Neg Hx     ALLERGIES:  has No Known Allergies.  MEDICATIONS:  Current Outpatient Prescriptions  Medication Sig Dispense Refill  . acyclovir (ZOVIRAX) 400 MG tablet Take 1 tablet (400 mg total) by mouth daily. (Patient not taking: Reported on 04/16/2017) 30 tablet 3  . allopurinol (ZYLOPRIM) 100 MG tablet Take 1 tablet (100 mg total) by mouth 2 (two) times daily. (Patient not taking: Reported on 04/16/2017) 60 tablet 0  . dexamethasone (DECADRON) 4 MG tablet Take 2 tablets (8 mg total) by mouth daily. Start the day after bendamustine chemotherapy for 2 days. Take with food. (Patient  not taking: Reported on 04/16/2017) 30 tablet 1  . hydrOXYzine (ATARAX/VISTARIL) 25 MG tablet Take 1 tablet (25 mg total) by mouth every 6 (six) hours as needed for itching. (Patient not taking: Reported on 08/06/2017) 50 tablet 0  . lidocaine-prilocaine (EMLA) cream Apply 1 application topically as needed. (Patient not taking: Reported on 11/06/2016) 30 g 6   No current facility-administered medications for this visit.     REVIEW OF  SYSTEMS:    A 10+ POINT REVIEW OF SYSTEMS WAS OBTAINED including neurology, dermatology, psychiatry, cardiac, respiratory, lymph, extremities, GI, GU, Musculoskeletal, constitutional, breasts, reproductive, HEENT.  All pertinent positives are noted in the HPI.  All others are negative.  PHYSICAL EXAMINATION:  ECOG PERFORMANCE STATUS: 2 - Symptomatic, <50% confined to bed .VS as noted in [place], in no acute distress and comfortable SKIN: some nodular skin lesions over extremities -much improved OROPHARYNX:no exudate, no erythema and lips, multiple carious teeth with significant periodontal disease. NECK: supple, no JVD, thyroid normal size, non-tender, without nodularity LYMPH:  no palpable lymphadenopathy in the cervical, axillary or inguinal LUNGS: clear to auscultation with normal respiratory effort HEART: regular rate & rhythm,  no murmurs and no lower extremity edema ABDOMEN: abdomen soft, non-tender, normoactive bowel sounds  Musculoskeletal: no cyanosis of digits and no clubbing  PSYCH: alert & oriented x 3 with fluent speech NEURO: no focal motor/sensory deficits  LABORATORY DATA:  I have reviewed the data as listed   CBC Latest Ref Rng & Units 08/06/2017 07/09/2017 06/11/2017  WBC 4.0 - 10.3 10e3/uL 7.0 8.9 7.9  Hemoglobin 13.0 - 17.1 g/dL 14.2 13.9 14.3  Hematocrit 38.4 - 49.9 % 44.4 43.4 44.7  Platelets 140 - 400 10e3/uL 126(L) 126(L) 142    . CMP Latest Ref Rng & Units 08/06/2017 07/09/2017 06/11/2017  Glucose 70 - 140 mg/dl 226(H) 184(H) 198(H)  BUN 7.0 - 26.0 mg/dL 7.5 11.6 13.3  Creatinine 0.7 - 1.3 mg/dL 0.9 0.9 1.0  Sodium 136 - 145 mEq/L 139 141 140  Potassium 3.5 - 5.1 mEq/L 3.6 4.1 3.9  Chloride 101 - 111 mmol/L - - -  CO2 22 - 29 mEq/L 28 28 30(H)  Calcium 8.4 - 10.4 mg/dL 9.3 9.7 9.9  Total Protein 6.4 - 8.3 g/dL 6.4 6.6 6.7  Total Bilirubin 0.20 - 1.20 mg/dL 0.48 0.35 0.36  Alkaline Phos 40 - 150 U/L 75 79 79  AST 5 - 34 U/L 13 14 13   ALT 0 - 55  U/L 18 21 18     Lab Results  Component Value Date   LDH 144 06/11/2017     RADIOGRAPHIC STUDIES: I have personally reviewed the radiological images as listed and agreed with the findings in the report. No results found.  ECHO: 11/01/2015: Study Conclusions  - Left ventricle: The cavity size was normal. There was mild concentric hypertrophy. Systolic function was normal. The estimated ejection fraction was in the range of 50% to 55%. Wall motion was normal; there were no regional wall motion abnormalities. Doppler parameters are consistent with abnormal left ventricular relaxation (grade 1 diastolic dysfunction). - Aortic valve: Transvalvular velocity was within the normal range. There was no stenosis. There was no regurgitation. - Mitral valve: There was no regurgitation. - Right ventricle: The cavity size was normal. Wall thickness was normal. Systolic function was normal. - Tricuspid valve: There was no regurgitation. - Inferior vena cava: The vessel was normal in size. The respirophasic diameter changes were in the normal range (>= 50%), consistent with  normal central venous pressure.Study Conclusions  - Left ventricle: The cavity size was normal. There was mild concentric hypertrophy. Systolic function was normal. The estimated ejection fraction was in the range of 50% to 55%. Wall motion was normal; there were no regional wall motion abnormalities. Doppler parameters are consistent with abnormal left ventricular relaxation (grade 1 diastolic dysfunction). - Aortic valve: Transvalvular velocity was within the normal range. There was no stenosis. There was no regurgitation. - Mitral valve: There was no regurgitation. - Right ventricle: The cavity size was normal. Wall thickness was normal. Systolic function was normal. - Tricuspid valve: There was no regurgitation. - Inferior vena cava: The vessel was normal in size. The respirophasic  diameter changes were in the normal range (>= 50%), consistent with normal central venous pressure.  ASSESSMENT & PLAN:    61 yo man with   1) h/o previous Stage IVB E Mantle Cell lymphoma with likely gastric involvement, extensive LNadenopathy. ECHO nl EF ECOG PS 2 but activities significantly limited due to being legally blind HIV/Hep C/Hep B neg Baseline LDH 450--->135-->163-->144  PET/CT scan after 3 cycles of R CHOP show good overall response. Stomach lesion still with very FDG uptake ? Residual lymphoma versus inflammation.  Patient is status post 6 cycles of R CHOP. PET/CT after 6 cycles shows resolution of hypermetabolic lymphadenopathy and gastric wall activity . Patient was on maintenance Rituxan for 1 year now prior to relapse.   2) Relapsed Pleomorphic mantle cell lymphoma Presented with rectal bleeding and constipation with a large rectosigmoid mass and other evidence of colonic involvement. CT chest abdomen pelvis showed multiple areas of lymphadenopathy in the abdomen as well as in the chest suggesting significant recurrence. PET/CT scan 03/25/2017 shows diffuse involvement with mantle cell lymphoma involving the chest abdomen pelvis and bowels. PET/ct 06/19/2017-- showed significant partial response to BR treatment PLAN -Clinically he is doing well today, he seems to be responding to treatment well. He seems more comfortable on my examination.  -labs are stable today and he is appropriate to proceed with 6th cycle of BR -Patient will have a repeat PET scan completed following completion of this cycle of chemotherapy in 5 weeks -he has dry skin and pruritus but no nodules at this time. Recommended using moisturizer and sarna lotion.  3) Retinitis pigmentosa causing legal blindness -  is seeing a retina specialist in town. No acute interventions at this time. Notes that his vision is progressively getting worse. His daughter and granddaughter are helping him out at  home.   3) Weight loss and protein calorie malnutrition. Resolved. Weight has improved significantly since his diagnosis and back to his baseline weight.  Wt Readings from Last 3 Encounters:  08/06/17 203 lb 8 oz (92.3 kg)  06/11/17 200 lb 11.2 oz (91 kg)  05/14/17 200 lb 9.6 oz (91 kg)   4) h/o Peri-orbital dermatitis /Conjuncitivitis - patient was seen by the dermatologist and given topical desonide ointment which led to the resolution of his dermatitis. There were not sure about the etiology but noted then to could not rule out Rituxan is a possibility. Currently his dermatitis has resolved and his conjunctivitis has resolved. He did not have rash anywhere else at the time. No oral sores or other toxicities. This happened about 4 days after Rituxan another chemotherapy. This has completely resolved and patient has not had an further issues with this with several additional doses of Rituxan.  5) Patient Active Problem List   Diagnosis Date Noted  .  Counseling regarding advanced care planning and goals of care 03/13/2017  . Rectal mass 03/02/2017  . Colon cancer metastasized to liver and lung([facility]) 03/02/2017  . Rectal bleeding 02/28/2017  . Portacath in place 01/30/2016  . Skin lesion of chest wall 12/19/2015  . Shingles 12/19/2015  . Rash 11/28/2015  . 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 Mantle cell lymphoma of lymph nodes of multiple regions ([place]) 11/01/2015  . Protein-calorie malnutrition ([place]) 11/01/2015  . Abscess of arm, right 10/27/2015  . Lymphoma of lymph nodes of multiple sites ([place]) 10/27/2015  . Shortness of breath 10/27/2015  . Shortness of breath dyspnea 10/27/2015  . Hypoalbuminemia 10/25/2015  . Elevated lipase 10/25/2015  . Inguinal hernia 10/25/2015  . Urinary retention 10/25/2015  . Abdominal pain   . Lymphadenopathy 10/24/2015   -continue f/u with PCP for other continued medical issues.  PET/CT in 5 weeks.  RTC with Dr Irene Limbo in 6 weeks with labs  I spent 20 minutes  counseling the patient face to face. The total time spent in the appointment was 25 minutes and more than 50% was on counseling and direct patient cares. Patient was accompanied by his grand-daughter for this visit.   Sullivan Lone MD [place] AAHIVMS [facility] Hematology/Oncology Physician [place]  (Office):       [phone] (Work cell):  [phone] (Fax):           [phone]  This document serves as a record of services personally performed by Sullivan Lone, MD. It was created on his behalf by Reola Mosher, a trained medical scribe. The creation of this record is based on the scribe's personal observations and the provider's statements to them. This document has been checked and approved by the attending provider.

## 2017-08-07 ENCOUNTER — Telehealth: Payer: Self-pay | Admitting: Hematology

## 2017-08-07 ENCOUNTER — Ambulatory Visit (HOSPITAL_BASED_OUTPATIENT_CLINIC_OR_DEPARTMENT_OTHER): Payer: Medicare Other

## 2017-08-07 VITALS — BP 136/89 | HR 80 | Temp 98.2°F | Resp 17

## 2017-08-07 DIAGNOSIS — Z5111 Encounter for antineoplastic chemotherapy: Secondary | ICD-10-CM

## 2017-08-07 DIAGNOSIS — C8318 Mantle cell lymphoma, lymph nodes of multiple sites: Secondary | ICD-10-CM

## 2017-08-07 DIAGNOSIS — Z7189 Other specified counseling: Secondary | ICD-10-CM

## 2017-08-07 MED ORDER — DEXAMETHASONE SODIUM PHOSPHATE 10 MG/ML IJ SOLN
10.0000 mg | Freq: Once | INTRAMUSCULAR | Status: AC
  Administered 2017-08-07: 10 mg via INTRAVENOUS

## 2017-08-07 MED ORDER — DEXAMETHASONE SODIUM PHOSPHATE 10 MG/ML IJ SOLN
INTRAMUSCULAR | Status: AC
Start: 2017-08-07 — End: ?
  Filled 2017-08-07: qty 1

## 2017-08-07 MED ORDER — HEPARIN SOD (PORK) LOCK FLUSH 100 UNIT/ML IV SOLN
500.0000 [IU] | Freq: Once | INTRAVENOUS | Status: AC | PRN
  Administered 2017-08-07: 500 [IU]
  Filled 2017-08-07: qty 5

## 2017-08-07 MED ORDER — SODIUM CHLORIDE 0.9 % IV SOLN
Freq: Once | INTRAVENOUS | Status: AC
  Administered 2017-08-07: 15:00:00 via INTRAVENOUS

## 2017-08-07 MED ORDER — SODIUM CHLORIDE 0.9 % IV SOLN
90.0000 mg/m2 | Freq: Once | INTRAVENOUS | Status: AC
  Administered 2017-08-07: 200 mg via INTRAVENOUS
  Filled 2017-08-07: qty 8

## 2017-08-07 MED ORDER — SODIUM CHLORIDE 0.9% FLUSH
10.0000 mL | INTRAVENOUS | Status: DC | PRN
  Administered 2017-08-07: 10 mL
  Filled 2017-08-07: qty 10

## 2017-08-07 NOTE — Telephone Encounter (Signed)
Scheduled appt per 10/30 los - patient is aware of appts added.

## 2017-09-17 ENCOUNTER — Other Ambulatory Visit (HOSPITAL_BASED_OUTPATIENT_CLINIC_OR_DEPARTMENT_OTHER): Payer: Medicare Other

## 2017-09-17 ENCOUNTER — Encounter: Payer: Self-pay | Admitting: Hematology

## 2017-09-17 ENCOUNTER — Ambulatory Visit: Payer: Medicare Other

## 2017-09-17 ENCOUNTER — Ambulatory Visit (HOSPITAL_BASED_OUTPATIENT_CLINIC_OR_DEPARTMENT_OTHER): Payer: Medicare Other | Admitting: Hematology

## 2017-09-17 VITALS — BP 117/63 | HR 98 | Temp 97.7°F | Resp 20 | Ht 70.0 in | Wt 204.2 lb

## 2017-09-17 DIAGNOSIS — C8318 Mantle cell lymphoma, lymph nodes of multiple sites: Secondary | ICD-10-CM

## 2017-09-17 DIAGNOSIS — Z720 Tobacco use: Secondary | ICD-10-CM

## 2017-09-17 DIAGNOSIS — Z5112 Encounter for antineoplastic immunotherapy: Secondary | ICD-10-CM

## 2017-09-17 DIAGNOSIS — Z95828 Presence of other vascular implants and grafts: Secondary | ICD-10-CM

## 2017-09-17 DIAGNOSIS — K5900 Constipation, unspecified: Secondary | ICD-10-CM

## 2017-09-17 LAB — COMPREHENSIVE METABOLIC PANEL
ALT: 11 U/L (ref 0–55)
ANION GAP: 11 meq/L (ref 3–11)
AST: 10 U/L (ref 5–34)
Albumin: 3.6 g/dL (ref 3.5–5.0)
Alkaline Phosphatase: 87 U/L (ref 40–150)
BUN: 11.2 mg/dL (ref 7.0–26.0)
CHLORIDE: 101 meq/L (ref 98–109)
CO2: 26 meq/L (ref 22–29)
CREATININE: 1.1 mg/dL (ref 0.7–1.3)
Calcium: 9.5 mg/dL (ref 8.4–10.4)
EGFR: >60 mL/min/{1.73_m2} (ref >60)
GLUCOSE: 323 mg/dL — AB (ref 70–140)
Potassium: 3.8 mEq/L (ref 3.5–5.1)
SODIUM: 138 meq/L (ref 136–145)
Total Bilirubin: 0.33 mg/dL (ref 0.20–1.20)
Total Protein: 6.7 g/dL (ref 6.4–8.3)

## 2017-09-17 LAB — CBC & DIFF AND RETIC
BASO%: 0.4 % (ref 0.0–2.0)
BASOS ABS: 0 10*3/uL (ref 0.0–0.1)
EOS ABS: 0.2 10*3/uL (ref 0.0–0.5)
EOS%: 2.6 % (ref 0.0–7.0)
HEMATOCRIT: 43.2 % (ref 38.4–49.9)
HEMOGLOBIN: 13.8 g/dL (ref 13.0–17.1)
Immature Retic Fract: 5 % (ref 3.00–10.60)
LYMPH#: 0.9 10*3/uL (ref 0.9–3.3)
LYMPH%: 9.9 % — ABNORMAL LOW (ref 14.0–49.0)
MCH: 30.8 pg (ref 27.2–33.4)
MCHC: 31.9 g/dL — ABNORMAL LOW (ref 32.0–36.0)
MCV: 96.4 fL (ref 79.3–98.0)
MONO#: 0.3 10*3/uL (ref 0.1–0.9)
MONO%: 3.7 % (ref 0.0–14.0)
NEUT#: 7.6 10*3/uL — ABNORMAL HIGH (ref 1.5–6.5)
NEUT%: 83.4 % — ABNORMAL HIGH (ref 39.0–75.0)
Platelets: 202 10*3/uL (ref 140–400)
RBC: 4.48 10*6/uL (ref 4.20–5.82)
RDW: 14.3 % (ref 11.0–14.6)
RETIC %: 1.17 % (ref 0.80–1.80)
RETIC CT ABS: 52.42 10*3/uL (ref 34.80–93.90)
WBC: 9.2 10*3/uL (ref 4.0–10.3)

## 2017-09-17 LAB — LACTATE DEHYDROGENASE: LDH: 227 U/L (ref 125–245)

## 2017-09-17 MED ORDER — SODIUM CHLORIDE 0.9 % IJ SOLN
10.0000 mL | INTRAMUSCULAR | Status: DC | PRN
  Administered 2017-09-17: 10 mL via INTRAVENOUS
  Filled 2017-09-17: qty 10

## 2017-09-17 MED ORDER — HEPARIN SOD (PORK) LOCK FLUSH 100 UNIT/ML IV SOLN
500.0000 [IU] | Freq: Once | INTRAVENOUS | Status: AC | PRN
  Administered 2017-09-17: 500 [IU] via INTRAVENOUS
  Filled 2017-09-17: qty 5

## 2017-09-17 NOTE — Progress Notes (Signed)
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 Patient chooses not to pursue AutoHSCT based strategies which is quite reasonable considering his functional challenges with legal blindness and limited social support.  S/p 6 cycles of Bendamustine/Rituxan    HISTORY OF PRESENTING ILLNESS: plz see clinic note for details on initial presentation  Interval History:  Kristopher Johnson presents today for follow up for his mantle cell lymphoma. Overall, he reports that he is doing well overall and tolerated his final cycle of BR without prohibitive toxicities. He notes that he does have some inflammation around his rectum which feels similar to how his previous mass presented. He notes some constipation with this as well. He states that he has typically feels like he has to move his bowels 3-4 times a day, but will mainly only pass gas.  He has not been taking any stool softeners. He reports that he has not been straining more. He denies any abdominal pain. He reports some night sweats as well. He denies any issues with appetite or weight changes.  Was not able to get his PET/CT scheduled yet. On review of systems, pt denies fever, chills, rash, mouth sores, weight loss, decreased appetite, urinary complaints. Denies pain. Pt denies abdominal pain, nausea, vomiting. Pertinent positives are listed within the above HPI.   PAST MEDICAL HISTORY:  Past Medical History:  Diagnosis Date  . Abscess of arm, right 10/27/2015  . Lymphoma of lymph nodes of multiple sites ([place])  10/27/2015  . 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 Shortness of breath 10/27/2015    SURGICAL HISTORY: Past Surgical History:  Procedure Laterality Date  . Cataract surgery     Patient notes she has had bilateral Surgery in 1998 and 99  . FLEXIBLE SIGMOIDOSCOPY N/A 03/01/2017   Procedure: FLEXIBLE SIGMOIDOSCOPY;  Surgeon: Manus Gunning, MD;  Location: [facility];  Service: Gastroenterology;  Laterality: N/A;  . TONSILLECTOMY     about 61years old    SOCIAL HISTORY: Social History   Socioeconomic History  . Marital status: Widowed    Spouse name: Not on file  . Number of children: Not on file  . Years of education: Not on file  . Highest education level: Not on file  Social Needs  . Financial resource strain: Not on file  . Food insecurity - worry: Not on file  . Food insecurity - inability: Not on file  . Transportation needs - medical: Not on file  . Transportation needs - non-medical: Not on file  Occupational History  . Not on file  Tobacco Use  . Smoking status: Current Every Day Smoker    Packs/day: 1.00    Years: 33.00    Pack years: 33.00  . Smokeless tobacco: Never Used  Substance and Sexual Activity  . Alcohol use: No  . Drug use: No  . Sexual activity: Not on file    Comment: Disabled, retinis pigmentosa legally blind, 4children MPOA Lynnn(cousin)  Other Topics Concern  . Not on  file  Social History Narrative  . Not on file    FAMILY HISTORY: Family History  Problem Relation Age of Onset  . Colon cancer Neg Hx     ALLERGIES:  has No Known Allergies.  MEDICATIONS:  Current Outpatient Medications  Medication Sig Dispense Refill  . acyclovir (ZOVIRAX) 400 MG tablet Take 1 tablet (400 mg total) by mouth daily. (Patient not taking: Reported on 04/16/2017) 30 tablet 3  . allopurinol (ZYLOPRIM) 100 MG tablet Take 1 tablet (100 mg total) by mouth 2 (two)  times daily. (Patient not taking: Reported on 04/16/2017) 60 tablet 0  . dexamethasone (DECADRON) 4 MG tablet Take 2 tablets (8 mg total) by mouth daily. Start the day after bendamustine chemotherapy for 2 days. Take with food. (Patient not taking: Reported on 04/16/2017) 30 tablet 1  . hydrOXYzine (ATARAX/VISTARIL) 25 MG tablet Take 1 tablet (25 mg total) by mouth every 6 (six) hours as needed for itching. (Patient not taking: Reported on 08/06/2017) 50 tablet 0  . lidocaine-prilocaine (EMLA) cream Apply 1 application topically as needed. (Patient not taking: Reported on 11/06/2016) 30 g 6   No current facility-administered medications for this visit.     REVIEW OF SYSTEMS:    A 10+ POINT REVIEW OF SYSTEMS WAS OBTAINED including neurology, dermatology, psychiatry, cardiac, respiratory, lymph, extremities, GI, GU, Musculoskeletal, constitutional, breasts, reproductive, HEENT.  All pertinent positives are noted in the HPI.  All others are negative.  PHYSICAL EXAMINATION:  ECOG PERFORMANCE STATUS: 2 - Symptomatic, <50% confined to bed .VS as noted in [place], in no acute distress and comfortable SKIN: some nodular skin lesions over extremities -much improved OROPHARYNX:no exudate, no erythema and lips, multiple carious teeth with significant periodontal disease. NECK: supple, no JVD, thyroid normal size, non-tender, without nodularity LYMPH:  no palpable lymphadenopathy in the cervical, axillary or inguinal LUNGS: clear to auscultation with normal respiratory effort HEART: regular rate & rhythm,  no murmurs and no lower extremity edema ABDOMEN: abdomen soft, non-tender, normoactive bowel sounds  Musculoskeletal: no cyanosis of digits and no clubbing  PSYCH: alert & oriented x 3 with fluent speech NEURO: no focal motor/sensory deficits  LABORATORY DATA:  I have reviewed the data as listed   CBC Latest Ref Rng & Units 09/17/2017 08/06/2017 07/09/2017  WBC 4.0 - 10.3 10e3/uL 9.2 7.0  8.9  Hemoglobin 13.0 - 17.1 g/dL 13.8 14.2 13.9  Hematocrit 38.4 - 49.9 % 43.2 44.4 43.4  Platelets 140 - 400 10e3/uL 202 126(L) 126(L)    . CMP Latest Ref Rng & Units 09/17/2017 08/06/2017 07/09/2017  Glucose 70 - 140 mg/dl 323(H) 226(H) 184(H)  BUN 7.0 - 26.0 mg/dL 11.2 7.5 11.6  Creatinine 0.7 - 1.3 mg/dL 1.1 0.9 0.9  Sodium 136 - 145 mEq/L 138 139 141  Potassium 3.5 - 5.1 mEq/L 3.8 3.6 4.1  Chloride 101 - 111 mmol/L - - -  CO2 22 - 29 mEq/L 26 28 28   Calcium 8.4 - 10.4 mg/dL 9.5 9.3 9.7  Total Protein 6.4 - 8.3 g/dL 6.7 6.4 6.6  Total Bilirubin 0.20 - 1.20 mg/dL 0.33 0.48 0.35  Alkaline Phos 40 - 150 U/L 87 75 79  AST 5 - 34 U/L 10 13 14   ALT 0 - 55 U/L 11 18 21     Lab Results  Component Value Date   LDH 227 09/17/2017     RADIOGRAPHIC STUDIES: I have personally reviewed the radiological images as listed and agreed with the findings in the report. No  results found.  ECHO: 11/01/2015: Study Conclusions  - Left ventricle: The cavity size was normal. There was mild concentric hypertrophy. Systolic function was normal. The estimated ejection fraction was in the range of 50% to 55%. Wall motion was normal; there were no regional wall motion abnormalities. Doppler parameters are consistent with abnormal left ventricular relaxation (grade 1 diastolic dysfunction). - Aortic valve: Transvalvular velocity was within the normal range. There was no stenosis. There was no regurgitation. - Mitral valve: There was no regurgitation. - Right ventricle: The cavity size was normal. Wall thickness was normal. Systolic function was normal. - Tricuspid valve: There was no regurgitation. - Inferior vena cava: The vessel was normal in size. The respirophasic diameter changes were in the normal range (>= 50%), consistent with normal central venous pressure.Study Conclusions  - Left ventricle: The cavity size was normal. There was mild concentric hypertrophy. Systolic  function was normal. The estimated ejection fraction was in the range of 50% to 55%. Wall motion was normal; there were no regional wall motion abnormalities. Doppler parameters are consistent with abnormal left ventricular relaxation (grade 1 diastolic dysfunction). - Aortic valve: Transvalvular velocity was within the normal range. There was no stenosis. There was no regurgitation. - Mitral valve: There was no regurgitation. - Right ventricle: The cavity size was normal. Wall thickness was normal. Systolic function was normal. - Tricuspid valve: There was no regurgitation. - Inferior vena cava: The vessel was normal in size. The respirophasic diameter changes were in the normal range (>= 50%), consistent with normal central venous pressure.  ASSESSMENT & PLAN:    61 yo man with   1) h/o previous Stage IVB E Mantle Cell lymphoma with likely gastric involvement, extensive LNadenopathy. ECHO nl EF ECOG PS 2 but activities significantly limited due to being legally blind HIV/Hep C/Hep B neg Baseline LDH 450--->135-->163-->144  PET/CT scan after 3 cycles of R CHOP show good overall response. Stomach lesion still with very FDG uptake ? Residual lymphoma versus inflammation.  Patient is status post 6 cycles of R CHOP. PET/CT after 6 cycles shows resolution of hypermetabolic lymphadenopathy and gastric wall activity . Patient was on maintenance Rituxan for 1 year now prior to relapse.   2) Relapsed Pleomorphic mantle cell lymphoma Presented with rectal bleeding and constipation with a large rectosigmoid mass and other evidence of colonic involvement. CT chest abdomen pelvis showed multiple areas of lymphadenopathy in the abdomen as well as in the chest suggesting significant recurrence. PET/CT scan 03/25/2017 shows diffuse involvement with mantle cell lymphoma involving the chest abdomen pelvis and bowels. PET/ct 06/19/2017-- showed significant partial response to BR  treatment PLAN -Clinically he is doing well today. -labs are stable today, tolerated 6 cycles of BR well.  -Patient was scheduled to have PET/CT prior to today, however, there was some issues with scheduling. We will attempt to have this performed within the next 7-10 days.  -Previously, he has dry skin and pruritus but no nodules at this time. Previously recommended using moisturizer and sarna lotion. -I have recommended him to use stool softeners to help alleviate his constipation in the interim.  3) Retinitis pigmentosa causing legal blindness -  is seeing a retina specialist in town. No acute interventions at this time. Notes that his vision is progressively getting worse. His daughter and granddaughter are helping him out at home.   3) Weight loss and protein calorie malnutrition. Resolved. Weight has improved significantly since his diagnosis and back to his baseline weight.  Wt Readings  from Last 3 Encounters:  09/17/17 204 lb 3.2 oz (92.6 kg)  08/06/17 203 lb 8 oz (92.3 kg)  06/11/17 200 lb 11.2 oz (91 kg)   4) h/o Peri-orbital dermatitis /Conjuncitivitis - patient was seen by the dermatologist and given topical desonide ointment which led to the resolution of his dermatitis. There were not sure about the etiology but noted then to could not rule out Rituxan is a possibility. Currently his dermatitis has resolved and his conjunctivitis has resolved. He did not have rash anywhere else at the time. No oral sores or other toxicities. This happened about 4 days after Rituxan another chemotherapy. This has completely resolved and patient has not had an further issues with this with several additional doses of Rituxan.  5) Patient Active Problem List   Diagnosis Date Noted  . Counseling regarding advanced care planning and goals of care 03/13/2017  . Rectal mass 03/02/2017  . Colon cancer metastasized to liver and lung([facility]) 03/02/2017  . Rectal bleeding 02/28/2017  . Portacath in place  01/30/2016  . Skin lesion of chest wall 12/19/2015  . Shingles 12/19/2015  . Rash 11/28/2015  . 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 Mantle cell lymphoma of lymph nodes of multiple regions ([place]) 11/01/2015  . Protein-calorie malnutrition ([place]) 11/01/2015  . Abscess of arm, right 10/27/2015  . Lymphoma of lymph nodes of multiple sites ([place]) 10/27/2015  . Shortness of breath 10/27/2015  . Shortness of breath dyspnea 10/27/2015  . Hypoalbuminemia 10/25/2015  . Elevated lipase 10/25/2015  . Inguinal hernia 10/25/2015  . Urinary retention 10/25/2015  . Abdominal pain   . Lymphadenopathy 10/24/2015   -continue f/u with PCP for other continued medical issues.  PET/CT w/in the next 5-7 days RTC with Dr Irene Limbo on Dec 20th or 21st following scan  I spent 20 minutes counseling the patient face to face. The total time spent in the appointment was 25 minutes and more than 50% was on counseling and direct patient cares. Patient was accompanied by his grand-daughter for this visit.   Sullivan Lone MD [place] AAHIVMS [facility] Hematology/Oncology Physician [place]  (Office):       [phone] (Work cell):  [phone] (Fax):           [phone]  This document serves as a record of services personally performed by Sullivan Lone, MD. It was created on his behalf by Reola Mosher, a trained medical scribe. The creation of this record is based on the scribe's personal observations and the provider's statements to them.   .I have reviewed the above documentation for accuracy and completeness, and I agree with the above. Brunetta Genera MD MS

## 2017-09-22 ENCOUNTER — Telehealth: Payer: Self-pay | Admitting: Hematology

## 2017-09-22 NOTE — Telephone Encounter (Signed)
Scheduled appt per 12/11 los - left voicemail for patient

## 2017-09-27 ENCOUNTER — Encounter: Payer: Self-pay | Admitting: *Deleted

## 2017-09-27 ENCOUNTER — Ambulatory Visit: Payer: Medicare Other | Admitting: Hematology

## 2017-09-27 ENCOUNTER — Telehealth: Payer: Self-pay | Admitting: *Deleted

## 2017-09-27 NOTE — Telephone Encounter (Signed)
Pt was NS for MD visit today.  No PET scan scheduled as discussed at previous two MD visits.  No phone number listed for patient.  Attempted to call number listed for pt's daughter Altha Harm, person who answered advised "This is a new number for me, that person is no longer at this number."  Will mail letter to patient in attempt to schedule PET/MD follow up.

## 2017-10-12 ENCOUNTER — Telehealth: Payer: Self-pay

## 2017-10-12 NOTE — Telephone Encounter (Signed)
Individual came to nurse desk in the afternoon on 1/4. She claimed to be patient daughter, no name provided. She verbalized concern that the pt has missed last two PET scans due to blindness. She provided phone number [phone] to let her know when the patient is scheduled for PET scan. She expressed, "I am worried that he is getting worse and needs to have the PET scan sooner than this and a follow-up appointment with Dr. Irene Limbo. Attempt by this RN to contact pt daughter, Altha Harm, whose number is in pt chart. No answer. Message sent to Dr. Irene Limbo and Delle Reining, desk RN requesting guidance to proceed.

## 2017-10-21 ENCOUNTER — Other Ambulatory Visit: Payer: Self-pay

## 2017-10-21 DIAGNOSIS — C189 Malignant neoplasm of colon, unspecified: Secondary | ICD-10-CM

## 2017-10-21 DIAGNOSIS — C787 Secondary malignant neoplasm of liver and intrahepatic bile duct: Principal | ICD-10-CM

## 2017-10-22 ENCOUNTER — Telehealth: Payer: Self-pay | Admitting: Hematology

## 2017-10-22 NOTE — Telephone Encounter (Signed)
Scheduled appt per 1/14 sch message - left message with appt date and time.

## 2017-10-24 ENCOUNTER — Telehealth: Payer: Self-pay

## 2017-10-24 NOTE — Telephone Encounter (Signed)
Left VM with Earl Gala, pt cousin, to call back to confirm patient and caregivers aware of appointments on 1/18 and 1/21. Attempt to call pt daughter, but voicemail said the number belonged to [employer]. VM not left on that line.

## 2017-10-25 ENCOUNTER — Ambulatory Visit (HOSPITAL_COMMUNITY)
Admission: RE | Admit: 2017-10-25 | Discharge: 2017-10-25 | Disposition: A | Discharge status: Home / Self Care | Payer: Medicare Other | Source: Ambulatory Visit | Attending: Hematology | Admitting: Hematology

## 2017-10-25 ENCOUNTER — Encounter (HOSPITAL_COMMUNITY): Payer: Self-pay

## 2017-10-25 ENCOUNTER — Inpatient Hospital Stay (HOSPITAL_COMMUNITY)
Admission: EM | Admit: 2017-10-25 | Discharge: 2017-11-01 | DRG: 840 | Disposition: A | Discharge status: Home / Self Care | Payer: Medicare Other | Attending: Internal Medicine | Admitting: Internal Medicine

## 2017-10-25 ENCOUNTER — Inpatient Hospital Stay (HOSPITAL_COMMUNITY): Payer: Medicare Other

## 2017-10-25 ENCOUNTER — Other Ambulatory Visit: Payer: Self-pay

## 2017-10-25 ENCOUNTER — Emergency Department (HOSPITAL_COMMUNITY): Payer: Medicare Other

## 2017-10-25 DIAGNOSIS — J9601 Acute respiratory failure with hypoxia: Secondary | ICD-10-CM | POA: Diagnosis not present

## 2017-10-25 DIAGNOSIS — Z9889 Other specified postprocedural states: Secondary | ICD-10-CM | POA: Diagnosis not present

## 2017-10-25 DIAGNOSIS — R0600 Dyspnea, unspecified: Secondary | ICD-10-CM

## 2017-10-25 DIAGNOSIS — R634 Abnormal weight loss: Secondary | ICD-10-CM | POA: Diagnosis not present

## 2017-10-25 DIAGNOSIS — R000 Tachycardia, unspecified: Secondary | ICD-10-CM | POA: Diagnosis present

## 2017-10-25 DIAGNOSIS — J910 Malignant pleural effusion: Secondary | ICD-10-CM | POA: Diagnosis present

## 2017-10-25 DIAGNOSIS — J90 Pleural effusion, not elsewhere classified: Secondary | ICD-10-CM | POA: Insufficient documentation

## 2017-10-25 DIAGNOSIS — R0602 Shortness of breath: Secondary | ICD-10-CM | POA: Diagnosis not present

## 2017-10-25 DIAGNOSIS — R152 Fecal urgency: Secondary | ICD-10-CM | POA: Diagnosis not present

## 2017-10-25 DIAGNOSIS — R61 Generalized hyperhidrosis: Secondary | ICD-10-CM | POA: Diagnosis not present

## 2017-10-25 DIAGNOSIS — R0902 Hypoxemia: Secondary | ICD-10-CM | POA: Diagnosis present

## 2017-10-25 DIAGNOSIS — C8310 Mantle cell lymphoma, unspecified site: Secondary | ICD-10-CM | POA: Diagnosis present

## 2017-10-25 DIAGNOSIS — Z8619 Personal history of other infectious and parasitic diseases: Secondary | ICD-10-CM | POA: Diagnosis not present

## 2017-10-25 DIAGNOSIS — R591 Generalized enlarged lymph nodes: Secondary | ICD-10-CM | POA: Insufficient documentation

## 2017-10-25 DIAGNOSIS — R5383 Other fatigue: Secondary | ICD-10-CM | POA: Diagnosis not present

## 2017-10-25 DIAGNOSIS — R197 Diarrhea, unspecified: Secondary | ICD-10-CM | POA: Diagnosis present

## 2017-10-25 DIAGNOSIS — R0603 Acute respiratory distress: Secondary | ICD-10-CM | POA: Diagnosis present

## 2017-10-25 DIAGNOSIS — R1013 Epigastric pain: Secondary | ICD-10-CM | POA: Diagnosis present

## 2017-10-25 DIAGNOSIS — Z9221 Personal history of antineoplastic chemotherapy: Secondary | ICD-10-CM

## 2017-10-25 DIAGNOSIS — E43 Unspecified severe protein-calorie malnutrition: Secondary | ICD-10-CM | POA: Diagnosis present

## 2017-10-25 DIAGNOSIS — K5900 Constipation, unspecified: Secondary | ICD-10-CM | POA: Diagnosis present

## 2017-10-25 DIAGNOSIS — C8318 Mantle cell lymphoma, lymph nodes of multiple sites: Secondary | ICD-10-CM

## 2017-10-25 DIAGNOSIS — K6289 Other specified diseases of anus and rectum: Secondary | ICD-10-CM | POA: Diagnosis present

## 2017-10-25 DIAGNOSIS — E46 Unspecified protein-calorie malnutrition: Secondary | ICD-10-CM | POA: Diagnosis not present

## 2017-10-25 DIAGNOSIS — H3552 Pigmentary retinal dystrophy: Secondary | ICD-10-CM | POA: Diagnosis present

## 2017-10-25 DIAGNOSIS — J9811 Atelectasis: Secondary | ICD-10-CM | POA: Diagnosis present

## 2017-10-25 DIAGNOSIS — Z79899 Other long term (current) drug therapy: Secondary | ICD-10-CM | POA: Diagnosis not present

## 2017-10-25 DIAGNOSIS — G8929 Other chronic pain: Secondary | ICD-10-CM | POA: Diagnosis present

## 2017-10-25 DIAGNOSIS — F17210 Nicotine dependence, cigarettes, uncomplicated: Secondary | ICD-10-CM | POA: Diagnosis present

## 2017-10-25 DIAGNOSIS — I700 Atherosclerosis of aorta: Secondary | ICD-10-CM

## 2017-10-25 DIAGNOSIS — Z7901 Long term (current) use of anticoagulants: Secondary | ICD-10-CM

## 2017-10-25 DIAGNOSIS — H548 Legal blindness, as defined in USA: Secondary | ICD-10-CM | POA: Diagnosis present

## 2017-10-25 LAB — CBC WITH DIFFERENTIAL/PLATELET
BASOS ABS: 0 10*3/uL (ref 0.0–0.1)
BASOS PCT: 0 %
EOS ABS: 0.2 10*3/uL (ref 0.0–0.7)
Eosinophils Relative: 1 %
HEMATOCRIT: 40.8 % (ref 39.0–52.0)
HEMOGLOBIN: 13.2 g/dL (ref 13.0–17.0)
Lymphocytes Relative: 6 %
Lymphs Abs: 1 10*3/uL (ref 0.7–4.0)
MCH: 29.7 pg (ref 26.0–34.0)
MCHC: 32.4 g/dL (ref 30.0–36.0)
MCV: 91.9 fL (ref 78.0–100.0)
MONOS PCT: 6 %
Monocytes Absolute: 1 10*3/uL (ref 0.1–1.0)
NEUTROS ABS: 15.2 10*3/uL — AB (ref 1.7–7.7)
NEUTROS PCT: 87 %
Platelets: 352 10*3/uL (ref 150–400)
RBC: 4.44 MIL/uL (ref 4.22–5.81)
RDW: 14.1 % (ref 11.5–15.5)
WBC: 17.4 10*3/uL — ABNORMAL HIGH (ref 4.0–10.5)

## 2017-10-25 LAB — AMYLASE, PLEURAL OR PERITONEAL FLUID: Amylase, Fluid: 26 U/L

## 2017-10-25 LAB — GRAM STAIN

## 2017-10-25 LAB — I-STAT CHEM 8, ED
BUN: 12 mg/dL (ref 6–20)
CALCIUM ION: 1.15 mmol/L (ref 1.15–1.40)
CHLORIDE: 99 mmol/L — AB (ref 101–111)
Creatinine, Ser: 0.8 mg/dL (ref 0.61–1.24)
Glucose, Bld: 154 mg/dL — ABNORMAL HIGH (ref 65–99)
HCT: 46 % (ref 39.0–52.0)
Hemoglobin: 15.6 g/dL (ref 13.0–17.0)
POTASSIUM: 3.6 mmol/L (ref 3.5–5.1)
SODIUM: 139 mmol/L (ref 135–145)
TCO2: 28 mmol/L (ref 22–32)

## 2017-10-25 LAB — COMPREHENSIVE METABOLIC PANEL
ALBUMIN: 3.1 g/dL — AB (ref 3.5–5.0)
ALK PHOS: 79 U/L (ref 38–126)
ALT: 18 U/L (ref 17–63)
AST: 27 U/L (ref 15–41)
Anion gap: 13 (ref 5–15)
BILIRUBIN TOTAL: 1.4 mg/dL — AB (ref 0.3–1.2)
BUN: 13 mg/dL (ref 6–20)
CALCIUM: 9.1 mg/dL (ref 8.9–10.3)
CO2: 26 mmol/L (ref 22–32)
Chloride: 100 mmol/L — ABNORMAL LOW (ref 101–111)
Creatinine, Ser: 1.12 mg/dL (ref 0.61–1.24)
GFR calc Af Amer: >60 mL/min (ref >60)
GFR calc non Af Amer: >60 mL/min (ref >60)
GLUCOSE: 155 mg/dL — AB (ref 65–99)
Potassium: 4.1 mmol/L (ref 3.5–5.1)
SODIUM: 139 mmol/L (ref 135–145)
TOTAL PROTEIN: 6.9 g/dL (ref 6.5–8.1)

## 2017-10-25 LAB — TYPE AND SCREEN
ABO/RH(D): O POS
Antibody Screen: NEGATIVE

## 2017-10-25 LAB — LACTATE DEHYDROGENASE, PLEURAL OR PERITONEAL FLUID: LD, Fluid: 611 U/L — ABNORMAL HIGH (ref 3–23)

## 2017-10-25 LAB — APTT: aPTT: 33 seconds (ref 24–36)

## 2017-10-25 LAB — BODY FLUID CELL COUNT WITH DIFFERENTIAL
LYMPHS FL: 90 %
Monocyte-Macrophage-Serous Fluid: 6 % — ABNORMAL LOW (ref 50–90)
NEUTROPHIL FLUID: 4 % (ref 0–25)
Total Nucleated Cell Count, Fluid: 3140 cu mm — ABNORMAL HIGH (ref 0–1000)

## 2017-10-25 LAB — ALBUMIN, PLEURAL OR PERITONEAL FLUID: Albumin, Fluid: 2.1 g/dL

## 2017-10-25 LAB — PROTIME-INR
INR: 1.02
Prothrombin Time: 13.4 seconds (ref 11.4–15.2)

## 2017-10-25 LAB — I-STAT TROPONIN, ED: TROPONIN I, POC: 0.01 ng/mL (ref 0.00–0.08)

## 2017-10-25 LAB — PROTEIN, PLEURAL OR PERITONEAL FLUID: Total protein, fluid: 3.2 g/dL

## 2017-10-25 LAB — CBG MONITORING, ED
GLUCOSE-CAPILLARY: 103 mg/dL — AB (ref 65–99)
Glucose-Capillary: 164 mg/dL — ABNORMAL HIGH (ref 65–99)

## 2017-10-25 LAB — GLUCOSE, PLEURAL OR PERITONEAL FLUID: GLUCOSE FL: 111 mg/dL

## 2017-10-25 MED ORDER — ENOXAPARIN SODIUM 40 MG/0.4ML ~~LOC~~ SOLN
40.0000 mg | SUBCUTANEOUS | Status: DC
  Filled 2017-10-25 (×4): qty 0.4

## 2017-10-25 MED ORDER — ONDANSETRON HCL 4 MG/2ML IJ SOLN: 4.0000 mg | Freq: Four times a day (QID) | INTRAMUSCULAR | Status: DC | PRN

## 2017-10-25 MED ORDER — ACETAMINOPHEN 325 MG PO TABS: 650.0000 mg | ORAL_TABLET | Freq: Four times a day (QID) | ORAL | Status: DC | PRN

## 2017-10-25 MED ORDER — FLUDEOXYGLUCOSE F - 18 (FDG) INJECTION
10.2000 | Freq: Once | INTRAVENOUS | Status: AC | PRN
  Administered 2017-10-25: 10.2 via INTRAVENOUS

## 2017-10-25 MED ORDER — ONDANSETRON HCL 4 MG PO TABS: 4.0000 mg | ORAL_TABLET | Freq: Four times a day (QID) | ORAL | Status: DC | PRN

## 2017-10-25 MED ORDER — ACETAMINOPHEN 650 MG RE SUPP: 650.0000 mg | Freq: Four times a day (QID) | RECTAL | Status: DC | PRN

## 2017-10-25 MED ORDER — SODIUM CHLORIDE 0.9 % IV SOLN
INTRAVENOUS | Status: DC
  Administered 2017-10-25: 09:00:00 via INTRAVENOUS

## 2017-10-25 MED ORDER — LIDOCAINE HCL 1 % IJ SOLN
INTRAMUSCULAR | Status: AC
  Filled 2017-10-25: qty 10

## 2017-10-25 NOTE — H&P (Signed)
TRH H&P   Patient Demographics:    Kristopher Johnson, is a 62 y.o. male  MRN: [id number]   DOB - 1956/08/22  Admit Date - 10/25/2017  Outpatient Primary MD for the patient is Patient, No Pcp Per  Referring MD: Dr Ayesha Rumpf  Outpatient Specialists:  Dr Irene Limbo  Patient coming from home  Chief Complaint  Patient presents with  . Shortness of Breath  . Tachycardia  . Melena      HPI:    Kristopher Johnson  is a 62 y.o. male, with stage IV mantle cell lymphoma diagnosed in 10/2015, is a severe gradient status post R-CHOP 6 cycles followed by maintenance Rituxan, awaiting PET scan, retinitis pigmentosa with legal blindness was sent to the ED from PET scan which was being scheduled today. Patient complains of increasing shortness of breath since early this month which has progressed and associated orthopnea. He also reports chronic epigastric abdominal pain and having difficulty with regular bowel movements due to rectal pain (reports passing mainly mucus and also having very poor by mouth intake). Patient denies fevers but does report occasional night sweats. Denies chest pain, palpitations, nausea, vomiting, headache, dizziness, abdominal pain, dysuria, tingling or numbness of extremities.  Course in the ED neck line patient was tachycardic low 100s, tachypneic, O2 sat dropped to 80% on room air, afebrile. Blood work showed invasive 17.4 K, normal chemistry, glucose of 154. Chest x-ray showed large left-sided pleural effusion with possible consolidation. Hospitalist consulted for management of his large (likely malignant) pleural effusion.   Review of systems:    In addition to the HPI above, No Fever-chills, No Headache, No changes with Vision or hearing, No problems swallowing food or Liquids, No Chest pain, Cough , shortness of breath and orthopnea No Abdominal pain, No Nausea or Vommitting,  constipation+++ No Blood in stool or Urine, No dysuria, No new skin rashes or bruises, No new joints pains-aches,  Generalized weakness, no tingling, numbness in any extremity, No recent weight gain or loss, No polyuria, polydypsia or polyphagia, No significant Mental Stressors.     With Past History of the following :    Past Medical History:  Diagnosis Date  . Abscess of arm, right 10/27/2015  . Lymphoma of lymph nodes of multiple sites ([place]) 10/27/2015  . 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 Shortness of breath 10/27/2015      Past Surgical History:  Procedure Laterality Date  . Cataract surgery     Patient notes she has had bilateral Surgery in 1998 and 99  . FLEXIBLE SIGMOIDOSCOPY N/A 03/01/2017   Procedure: FLEXIBLE SIGMOIDOSCOPY;  Surgeon: Manus Gunning, MD;  Location: [facility];  Service: Gastroenterology;  Laterality: N/A;  . TONSILLECTOMY     about 62years old  Social History:     Social History   Tobacco Use  . Smoking status: Current Every Day Smoker    Packs/day: 1.00    Years: 33.00    Pack years: 33.00  . Smokeless tobacco: Never Used  Substance Use Topics  . Alcohol use: No     Lives - home  Mobility - independent     Family History :     Family History  Problem Relation Age of Onset  . Colon cancer Neg Hx       Home Medications:   Prior to Admission medications   Medication Sig Start Date End Date Taking? Authorizing Provider  acyclovir (ZOVIRAX) 400 MG tablet Take 1 tablet (400 mg total) by mouth daily. Patient not taking: Reported on 04/16/2017 03/13/17   Brunetta Genera, MD  allopurinol (ZYLOPRIM) 100 MG tablet Take 1 tablet (100 mg total) by mouth 2 (two) times daily. Patient not taking: Reported on 04/16/2017 03/13/17   Brunetta Genera, MD  dexamethasone (DECADRON) 4 MG tablet Take 2 tablets (8 mg total) by mouth  daily. Start the day after bendamustine chemotherapy for 2 days. Take with food. Patient not taking: Reported on 04/16/2017 03/13/17   Brunetta Genera, MD  hydrOXYzine (ATARAX/VISTARIL) 25 MG tablet Take 1 tablet (25 mg total) by mouth every 6 (six) hours as needed for itching. Patient not taking: Reported on 08/06/2017 05/14/17   Brunetta Genera, MD  lidocaine-prilocaine (EMLA) cream Apply 1 application topically as needed. Patient not taking: Reported on 11/06/2016 11/15/15   Brunetta Genera, MD     Allergies:    No Known Allergies   Physical Exam:   Vitals  Blood pressure 102/76, pulse (!) 110, temperature (!) 97.4 F (36.3 C), temperature source Oral, resp. rate 20, weight 92.5 kg (204 lb), SpO2 91 %.   Gen.: Middle aged male appears fatigued, not in distress HEENT: Pupils reactive bilaterally, EOMI, No pallor, no icterus, dry mucosa, supple neck, no cervical lymphadenopathy Chest: Markedly diminished breath sounds over left lung, no added sounds CVS: S1 and S2 tachycardic, no murmurs rub or gallop GI: Soft, nondistended, nontender, bowel sounds present  musculoskeletal: Warm, no edema   Data Review:    CBC Recent Labs  Lab 10/25/17 0844 10/25/17 0918  WBC 17.4*  --   HGB 13.2 15.6  HCT 40.8 46.0  PLT 352  --   MCV 91.9  --   MCH 29.7  --   MCHC 32.4  --   RDW 14.1  --   LYMPHSABS 1.0  --   MONOABS 1.0  --   EOSABS 0.2  --   BASOSABS 0.0  --    ------------------------------------------------------------------------------------------------------------------  Chemistries  Recent Labs  Lab 10/25/17 0844 10/25/17 0918  NA 139 139  K 4.1 3.6  CL 100* 99*  CO2 26  --   GLUCOSE 155* 154*  BUN 13 12  CREATININE 1.12 0.80  CALCIUM 9.1  --   AST 27  --   ALT 18  --   ALKPHOS 79  --   BILITOT 1.4*  --    ------------------------------------------------------------------------------------------------------------------ estimated creatinine clearance  is 108 mL/min (by C-G formula based on SCr of 0.8 mg/dL). ------------------------------------------------------------------------------------------------------------------ No results for input(s): TSH, T4TOTAL, T3FREE, THYROIDAB in the last 72 hours.  Invalid input(s): FREET3  Coagulation profile Recent Labs  Lab 10/25/17 0844  INR 1.02   ------------------------------------------------------------------------------------------------------------------- No results for input(s): DDIMER in the last 72 hours. -------------------------------------------------------------------------------------------------------------------  Cardiac Enzymes No  results for input(s): CKMB, TROPONINI, MYOGLOBIN in the last 168 hours.  Invalid input(s): CK ------------------------------------------------------------------------------------------------------------------ No results found for: BNP   ---------------------------------------------------------------------------------------------------------------  Urinalysis    Component Value Date/Time   COLORURINE YELLOW 10/24/2015 1705   APPEARANCEUR CLOUDY (A) 10/24/2015 1705   LABSPEC 1.024 10/24/2015 1705   PHURINE 5.0 10/24/2015 1705   GLUCOSEU NEGATIVE 10/24/2015 1705   HGBUR NEGATIVE 10/24/2015 1705   BILIRUBINUR NEGATIVE 10/24/2015 1705   KETONESUR NEGATIVE 10/24/2015 1705   PROTEINUR 30 (A) 10/24/2015 1705   NITRITE NEGATIVE 10/24/2015 1705   LEUKOCYTESUR NEGATIVE 10/24/2015 1705    ----------------------------------------------------------------------------------------------------------------   Imaging Results:    Dg Chest Port 1 View  Result Date: 10/25/2017 CLINICAL DATA:  Shortness of breath.  History of lymphoma EXAM: PORTABLE CHEST 1 VIEW COMPARISON:  PET-CT June 19, 2017. FINDINGS: Port-A-Cath tip is in superior vena cava. Humor needle is placed in the port. No pneumothorax. There is a large left pleural effusion with  consolidation left mid lower lung zones. The right lung is clear. Heart appears mildly enlarged. Visualized pulmonary vascularity is normal. No adenopathy evident in regions that can be assessed. No bone lesions. IMPRESSION: Sizable pleural effusion with left mid and lower lung zone consolidation. Right lung clear. Heart appears prominent. Port-A-Cath tip in superior vena cava. No pneumothorax. Electronically Signed   By: Lowella Grip III M.D.   On: 10/25/2017 09:25    My personal review of EKG:   Assessment & Plan:    Principal Problem:   Malignant pleural effusion Underwent thoracentesis with 2 L amber colored fluid removed, no indication in follow-up chest x-ray still shows moderate left-sided pleural effusion, although improved and right-lung base soft tissue density possible for mass. Continue O2 via nasal cannula. Reassess for fluid accommodation and recurrence of symptoms. Check pleural fluid for cell count, LDH, protein, culture and cytology.  Active Problems:   Shortness of breath   Mantle cell lymphoma of lymph nodes of multiple regions [facility]) Outpatient follow-up with oncology. Had scheduled PET scan today which per patient is being postponed for 1/21. Possibly can be discharged home in 1-2 days if improved and can have PET scan early next week.    Protein-calorie malnutrition ([place]) Nutrition consult  Constipation Add when necessary MiraLAX    1.    DVT Prophylaxis: Lovenox  AM Labs Ordered, also please review Full Orders  Family Communication: Admission, patients condition and plan of care including tests being ordered have been discussed with the patient   Code Status full code  Likely DC to  home  Condition: Fair  Consults called: IR  Admission status: Inpatient  Time spent in minutes : 50   Broderick Fonseca M.D on 10/25/2017 at 11:40 AM  Between 7am to 7pm - Pager - [phone]. After 7pm go to [email or web address] - password [facility]  [facility] - Office   [phone]

## 2017-10-25 NOTE — ED Notes (Signed)
Patient transported to IR for throacentesis. Patient VSS. Belongings left in room.

## 2017-10-25 NOTE — ED Notes (Signed)
Bed: RESA Expected date:  Expected time:  Means of arrival:  Comments: 

## 2017-10-25 NOTE — ED Provider Notes (Signed)
[facility] DEPT Provider Note   CSN: [id number] Arrival date & time: 10/25/17  2725     History   Chief Complaint Chief Complaint  Patient presents with  . Shortness of Breath  . Tachycardia  . Melena    HPI Kristopher Johnson is a 62 y.o. male.  The history is provided by the patient. No language interpreter was used.  Shortness of Breath    Kristopher Johnson is a 62 y.o. male who presents to the Emergency Department complaining of sob.  He presented to the cancer center for a PET scan to be performed today and was referred to the emergency department for shortness of breath.  He reports increasing shortness of breath since the beginning of January with significant dyspnea on exertion as well as orthopnea.  He has associated chronic epigastric abdominal pain for the last year as well as a small amount ofand Kasie stools for the last year.  No fevers but he does endorse night sweats.  No chest pain, vomiting.  Symptoms are severe, constant, worsening. Past Medical History:  Diagnosis Date  . Abscess of arm, right 10/27/2015  . Lymphoma of lymph nodes of multiple sites ([place]) 10/27/2015  . 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 Shortness of breath 10/27/2015    Patient Active Problem List   Diagnosis Date Noted  . 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 Counseling regarding advanced care planning and goals of care 03/13/2017  . Rectal mass 03/02/2017  . Colon cancer metastasized to liver and lung([facility]) 03/02/2017  . Rectal bleeding 02/28/2017  . Portacath in place 01/30/2016  . Skin lesion of chest wall 12/19/2015  . Shingles 12/19/2015  . Rash 11/28/2015  . 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 Mantle cell lymphoma of lymph nodes of multiple regions ([place]) 11/01/2015  . Protein-calorie malnutrition ([place]) 11/01/2015  . Abscess of arm, right 10/27/2015  . Lymphoma of  lymph nodes of multiple sites ([place]) 10/27/2015  . Shortness of breath 10/27/2015  . Shortness of breath dyspnea 10/27/2015  . Hypoalbuminemia 10/25/2015  . Elevated lipase 10/25/2015  . Inguinal hernia 10/25/2015  . Urinary retention 10/25/2015  . Abdominal pain   . Lymphadenopathy 10/24/2015    Past Surgical History:  Procedure Laterality Date  . Cataract surgery     Patient notes she has had bilateral Surgery in 1998 and 99  . FLEXIBLE SIGMOIDOSCOPY N/A 03/01/2017   Procedure: FLEXIBLE SIGMOIDOSCOPY;  Surgeon: Manus Gunning, MD;  Location: [facility];  Service: Gastroenterology;  Laterality: N/A;  . TONSILLECTOMY     about 62years old       Home Medications    Prior to Admission medications   Medication Sig Start Date End Date Taking? Authorizing Provider  acyclovir (ZOVIRAX) 400 MG tablet Take 1 tablet (400 mg total) by mouth daily. Patient not taking: Reported on 04/16/2017 03/13/17   Brunetta Genera, MD  allopurinol (ZYLOPRIM) 100 MG tablet Take 1 tablet (100 mg total) by mouth 2 (two) times daily. Patient not taking: Reported on 04/16/2017 03/13/17   Brunetta Genera, MD  dexamethasone (DECADRON) 4 MG tablet Take 2 tablets (8 mg total) by mouth daily. Start the day after bendamustine chemotherapy for 2 days. Take with food. Patient not taking: Reported on 04/16/2017 03/13/17   Brunetta Genera, MD  hydrOXYzine (ATARAX/VISTARIL) 25 MG tablet Take 1 tablet (25 mg total) by mouth  every 6 (six) hours as needed for itching. Patient not taking: Reported on 08/06/2017 05/14/17   Brunetta Genera, MD  lidocaine-prilocaine (EMLA) cream Apply 1 application topically as needed. Patient not taking: Reported on 11/06/2016 11/15/15   Brunetta Genera, MD    Family History Family History  Problem Relation Age of Onset  . Colon cancer Neg Hx     Social History Social History   Tobacco Use  . Smoking status: Current Every Day Smoker    Packs/day: 1.00     Years: 33.00    Pack years: 33.00  . Smokeless tobacco: Never Used  Substance Use Topics  . Alcohol use: No  . Drug use: No     Allergies   Patient has no known allergies.   Review of Systems Review of Systems  Respiratory: Positive for shortness of breath.   All other systems reviewed and are negative.    Physical Exam Updated Vital Signs BP 114/61   Pulse (!) 109   Temp (!) 97.4 F (36.3 C) (Oral)   Resp 19   Wt 92.5 kg (204 lb)   SpO2 95%   BMI 29.27 kg/m   Physical Exam  Constitutional: He is oriented to person, place, and time. He appears well-developed and well-nourished. He appears ill.  HENT:  Head: Normocephalic and atraumatic.  Cardiovascular: Regular rhythm.  No murmur heard. Tachycardic  Pulmonary/Chest:  Tachypnea with decreased air movement in left lung fields.  Abdominal: Soft. There is no rebound and no guarding.  Mild epigastric tenderness  Musculoskeletal: He exhibits no edema or tenderness.  Neurological: He is alert and oriented to person, place, and time.  Skin: Skin is warm and dry.  Psychiatric: He has a normal mood and affect. His behavior is normal.  Nursing note and vitals reviewed.    ED Treatments / Results  Labs (all labs ordered are listed, but only abnormal results are displayed) Labs Reviewed  CBC WITH DIFFERENTIAL/PLATELET - Abnormal; Notable for the following components:      Result Value   WBC 17.4 (*)    Neutro Abs 15.2 (*)    All other components within normal limits  COMPREHENSIVE METABOLIC PANEL - Abnormal; Notable for the following components:   Chloride 100 (*)    Glucose, Bld 155 (*)    Albumin 3.1 (*)    Total Bilirubin 1.4 (*)    All other components within normal limits  BODY FLUID CELL COUNT WITH DIFFERENTIAL - Abnormal; Notable for the following components:   Color, Fluid AMBER (*)    Appearance, Fluid TURBID (*)    WBC, Fluid 3,140 (*)    Monocyte-Macrophage-Serous Fluid 6 (*)    All other  components within normal limits  CBG MONITORING, ED - Abnormal; Notable for the following components:   Glucose-Capillary 164 (*)    All other components within normal limits  I-STAT CHEM 8, ED - Abnormal; Notable for the following components:   Chloride 99 (*)    Glucose, Bld 154 (*)    All other components within normal limits  CBG MONITORING, ED - Abnormal; Notable for the following components:   Glucose-Capillary 103 (*)    All other components within normal limits  BODY FLUID CULTURE  FUNGUS CULTURE WITH STAIN  PROTIME-INR  APTT  LACTATE DEHYDROGENASE, PLEURAL OR PERITONEAL FLUID  TRIGLYCERIDES, BODY FLUIDS  ALBUMIN, PLEURAL OR PERITONEAL FLUID  PROTEIN, PLEURAL OR PERITONEAL FLUID  GLUCOSE, PLEURAL OR PERITONEAL FLUID  PH, BODY FLUID  AMYLASE, PLEURAL OR  PERITONEAL FLUID   I-STAT TROPONIN, ED  TYPE AND SCREEN  CYTOLOGY - NON PAP  CYTOLOGY - NON PAP    EKG  EKG Interpretation  Date/Time:  Friday October 25 2017 08:36:06 EST Ventricular Rate:  116 PR Interval:    QRS Duration: 87 QT Interval:  320 QTC Calculation: 445 R Axis:   37 Text Interpretation:  Sinus tachycardia Probable LVH with secondary repol abnrm Confirmed by Quintella Reichert [phone]) on 10/25/2017 8:56:48 AM       Radiology Dg Chest 1 View  Result Date: 10/25/2017 CLINICAL DATA:  Left pleural effusion.  Status post thoracentesis. EXAM: CHEST 1 VIEW 1 p.m. COMPARISON:  10/25/2017 8:51 a.m. and PET-CT dated 06/19/2017 FINDINGS: There has been reduction in the size of the large left pleural effusion. Moderate effusion persists. No pneumothorax. There is abnormal soft tissue density at the right lung base medially which could represent a mass. There is slight focal pleural thickening laterally at the right lung base which is felt represent loculated fluid. Heart size and vascularity appear normal.  Power port in place. IMPRESSION: 1. No pneumothorax after left thoracentesis. 2. Moderate residual  left effusion. 3. Abnormal soft tissue density at the right base medially which could represent a mass. This was not present on the PET-CT dated 06/19/2017. Electronically Signed   By: Lorriane Shire M.D.   On: 10/25/2017 13:16   Dg Chest Port 1 View  Result Date: 10/25/2017 CLINICAL DATA:  Shortness of breath.  History of lymphoma EXAM: PORTABLE CHEST 1 VIEW COMPARISON:  PET-CT June 19, 2017. FINDINGS: Port-A-Cath tip is in superior vena cava. Humor needle is placed in the port. No pneumothorax. There is a large left pleural effusion with consolidation left mid lower lung zones. The right lung is clear. Heart appears mildly enlarged. Visualized pulmonary vascularity is normal. No adenopathy evident in regions that can be assessed. No bone lesions. IMPRESSION: Sizable pleural effusion with left mid and lower lung zone consolidation. Right lung clear. Heart appears prominent. Port-A-Cath tip in superior vena cava. No pneumothorax. Electronically Signed   By: Lowella Grip III M.D.   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 Request is made for diagnostic and therapeutic thoracentesis. EXAM: ULTRASOUND GUIDED DIAGNOSTIC AND THERAPEUTIC THORACENTESIS MEDICATIONS: 1% lidocaine COMPLICATIONS: None immediate. PROCEDURE: An ultrasound guided thoracentesis was thoroughly discussed with the patient and questions answered. The benefits, risks, alternatives and complications were also discussed. The patient understands and wishes to proceed with the procedure. Written consent was obtained. Ultrasound was performed to localize and mark an adequate pocket of fluid in the left chest. The area was then prepped and draped in the normal sterile fashion. 1% Lidocaine was used for local anesthesia. Under ultrasound guidance a Safe-T-Centesis catheter was introduced. Thoracentesis was performed. The  catheter was removed and a dressing applied. FINDINGS: A total of approximately 2 L of amber fluid was removed. Samples were sent to the laboratory as requested by the clinical team. IMPRESSION: Successful ultrasound guided left thoracentesis yielding 2 L of pleural fluid. The procedure was terminated at this time secondary to his first time maximum of 2 L. Read by: Saverio Danker, PA-C Electronically Signed   By: Markus Daft M.D.   On: 10/25/2017 13:17    Procedures Procedures (including critical care time)  Medications Ordered in ED Medications  lidocaine (XYLOCAINE) 1 % (with pres) injection (not administered)  Initial Impression / Assessment and Plan / ED Course  I have reviewed the triage vital signs and the nursing notes.  Pertinent labs & imaging results that were available during my care of the patient were reviewed by me and considered in my medical decision making (see chart for details).     Patient with history of mantle cell lymphoma referred from radiology for increased shortness of breath and tachycardia that appears to be progressive over the last couple of weeks.  He does have a large sided left pleural effusion that is likely contributing to his symptoms.  At rest on supplemental oxygen his symptoms appear to be improved.  Hospitalist consulted for admission for further treatment and evaluation of his pleural effusion.  Discussed with Dr. Irene Limbo with oncology who will see the patient in consult.  Final Clinical Impressions(s) / ED Diagnoses   Final diagnoses:  Large pleural effusion  S/P thoracentesis    ED Discharge Orders    None       Quintella Reichert, MD 10/25/17 1530

## 2017-10-25 NOTE — ED Notes (Signed)
Bed: WG66 Expected date:  Expected time:  Means of arrival:  Comments: Per charge

## 2017-10-25 NOTE — ED Notes (Signed)
ED TO INPATIENT HANDOFF REPORT  Name/Age/Gender Kristopher Johnson 62 y.o. male  Code Status Code Status History    Date Active Date Inactive Code Status Order ID Comments User Context   03/01/2017 01:23 03/02/2017 13:36 Full Code [id number]  [name], MD ED   10/24/2015 23:17 10/25/2015 20:51 Full Code [id number]  [name], MD Inpatient      Home/SNF/Other Home  Chief Complaint shob/tachycardia  Level of Care/Admitting Diagnosis ED Disposition    ED Disposition Condition [place]: [facility] [100102]  Level of Care: Telemetry [5]  Admit to tele based on following criteria: Other see comments  Comments: large malignant effusion that will require thoracentesis-underlying tachycardia  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 Abscess of arm, right 10/27/2015  . 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 Shortness of breath 10/27/2015    Allergies No Known Allergies  IV Location/Drains/Wounds Patient Lines/Drains/Airways Status   Active Line/Drains/Airways    Name:   Placement date:   Placement time:   Site:   Days:   Implanted Port 11/10/15 Right Chest   11/10/15    -    Chest   715          Labs/Imaging Results for orders placed or performed during the hospital encounter of 10/25/17 (from the past 48 hour(s))  CBG monitoring, ED     Status: Abnormal   Collection Time: 10/25/17   8:34 AM  Result Value Ref Range   Glucose-Capillary 164 (H) 65 - 99 mg/dL  CBC with Differential/Platelet     Status: Abnormal   Collection Time: 10/25/17  8:44 AM  Result Value Ref Range   WBC 17.4 (H) 4.0 - 10.5 K/uL   RBC 4.44 4.22 - 5.81 MIL/uL   Hemoglobin 13.2 13.0 - 17.0 g/dL   HCT 40.8 39.0 - 52.0 %   MCV 91.9 78.0 - 100.0 fL   MCH 29.7 26.0 - 34.0 pg   MCHC 32.4 30.0 - 36.0 g/dL   RDW 14.1 11.5 - 15.5 %   Platelets 352 150 - 400 K/uL   Neutrophils Relative % 87 %   Lymphocytes Relative 6 %   Monocytes Relative 6 %   Eosinophils Relative 1 %   Basophils Relative 0 %   Neutro Abs 15.2 (H) 1.7 - 7.7 K/uL   Lymphs Abs 1.0 0.7 - 4.0 K/uL   Monocytes Absolute 1.0 0.1 - 1.0 K/uL   Eosinophils Absolute 0.2 0.0 - 0.7 K/uL   Basophils Absolute 0.0 0.0 - 0.1 K/uL   Smear Review MORPHOLOGY UNREMARKABLE   Comprehensive metabolic panel     Status: Abnormal   Collection Time: 10/25/17  8:44 AM  Result Value Ref Range   Sodium 139 135 - 145 mmol/L   Potassium 4.1 3.5 - 5.1 mmol/L   Chloride 100 (L) 101 - 111 mmol/L   CO2 26 22 - 32 mmol/L  Glucose, Bld 155 (H) 65 - 99 mg/dL   BUN 13 6 - 20 mg/dL   Creatinine, Ser 1.12 0.61 - 1.24 mg/dL   Calcium 9.1 8.9 - 10.3 mg/dL   Total Protein 6.9 6.5 - 8.1 g/dL   Albumin 3.1 (L) 3.5 - 5.0 g/dL   AST 27 15 - 41 U/L   ALT 18 17 - 63 U/L   Alkaline Phosphatase 79 38 - 126 U/L   Total Bilirubin 1.4 (H) 0.3 - 1.2 mg/dL   GFR calc non Af Amer >60 >60 mL/min   GFR calc Af Amer >60 >60 mL/min    Comment: (NOTE) The eGFR has been calculated using the CKD EPI equation. This calculation has not been validated in all clinical situations. eGFR's persistently <60 mL/min signify possible Chronic Kidney Disease.    Anion gap 13 5 - 15  Protime-INR     Status: None   Collection Time: 10/25/17  8:44 AM  Result Value Ref Range   Prothrombin Time 13.4 11.4 - 15.2 seconds   INR 1.02   APTT     Status: None   Collection Time: 10/25/17  8:44 AM   Result Value Ref Range   aPTT 33 24 - 36 seconds  Type and screen     Status: None   Collection Time: 10/25/17  8:55 AM  Result Value Ref Range   ABO/RH(D) O POS    Antibody Screen NEG    Sample Expiration 10/28/2017   I-stat troponin, ED     Status: None   Collection Time: 10/25/17  9:17 AM  Result Value Ref Range   Troponin i, poc 0.01 0.00 - 0.08 ng/mL   Comment 3            Comment: Due to the release kinetics of cTnI, a negative result within the first hours of the onset of symptoms does not rule out myocardial infarction with certainty. If myocardial infarction is still suspected, repeat the test at appropriate intervals.   I-stat Chem 8, ED     Status: Abnormal   Collection Time: 10/25/17  9:18 AM  Result Value Ref Range   Sodium 139 135 - 145 mmol/L   Potassium 3.6 3.5 - 5.1 mmol/L   Chloride 99 (L) 101 - 111 mmol/L   BUN 12 6 - 20 mg/dL   Creatinine, Ser 0.80 0.61 - 1.24 mg/dL   Glucose, Bld 154 (H) 65 - 99 mg/dL   Calcium, Ion 1.15 1.15 - 1.40 mmol/L   TCO2 28 22 - 32 mmol/L   Hemoglobin 15.6 13.0 - 17.0 g/dL   HCT 46.0 39.0 - 52.0 %  Body fluid cell count with differential     Status: Abnormal   Collection Time: 10/25/17 12:33 PM  Result Value Ref Range   Fluid Type-FCT LEFT     Comment: PLEURAL CORRECTED ON 01/18 AT 1304: PREVIOUSLY REPORTED AS Pleural, L    Color, Fluid AMBER (A) YELLOW   Appearance, Fluid TURBID (A) CLEAR   WBC, Fluid 3,140 (H) 0 - 1,000 cu mm   Neutrophil Count, Fluid 4 0 - 25 %   Lymphs, Fluid 90 %   Monocyte-Macrophage-Serous Fluid 6 (L) 50 - 90 %   Other Cells, Fluid OTHER CELLS UNIDENTIFIED; SEE CYTOLOGY REPORT %  CBG monitoring, ED     Status: Abnormal   Collection Time: 10/25/17  1:39 PM  Result Value Ref Range   Glucose-Capillary 103 (H) 65 - 99 mg/dL   Dg Chest 1 View  Result Date: 10/25/2017 CLINICAL DATA:  Left pleural effusion.  Status post thoracentesis. EXAM: CHEST 1 VIEW 1 p.m. COMPARISON:  10/25/2017 8:51 a.m. and  PET-CT dated 06/19/2017 FINDINGS: There has been reduction in the size of the large left pleural effusion. Moderate effusion persists. No pneumothorax. There is abnormal soft tissue density at the right lung base medially which could represent a mass. There is slight focal pleural thickening laterally at the right lung base which is felt represent loculated fluid. Heart size and vascularity appear normal.  Power port in place. IMPRESSION: 1. No pneumothorax after left thoracentesis. 2. Moderate residual left effusion. 3. Abnormal soft tissue density at the right base medially which could represent a mass. This was not present on the PET-CT dated 06/19/2017. Electronically Signed   By: Lorriane Shire M.D.   On: 10/25/2017 13:16   Dg Chest Port 1 View  Result Date: 10/25/2017 CLINICAL DATA:  Shortness of breath.  History of lymphoma EXAM: PORTABLE CHEST 1 VIEW COMPARISON:  PET-CT June 19, 2017. FINDINGS: Port-A-Cath tip is in superior vena cava. Humor needle is placed in the port. No pneumothorax. There is a large left pleural effusion with consolidation left mid lower lung zones. The right lung is clear. Heart appears mildly enlarged. Visualized pulmonary vascularity is normal. No adenopathy evident in regions that can be assessed. No bone lesions. IMPRESSION: Sizable pleural effusion with left mid and lower lung zone consolidation. Right lung clear. Heart appears prominent. Port-A-Cath tip in superior vena cava. No pneumothorax. Electronically Signed   By: Lowella Grip III M.D.   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 Request is made for diagnostic and therapeutic thoracentesis. EXAM: ULTRASOUND GUIDED DIAGNOSTIC AND THERAPEUTIC THORACENTESIS MEDICATIONS: 1% lidocaine COMPLICATIONS: None immediate. PROCEDURE: An ultrasound guided thoracentesis was thoroughly discussed with  the patient and questions answered. The benefits, risks, alternatives and complications were also discussed. The patient understands and wishes to proceed with the procedure. Written consent was obtained. Ultrasound was performed to localize and mark an adequate pocket of fluid in the left chest. The area was then prepped and draped in the normal sterile fashion. 1% Lidocaine was used for local anesthesia. Under ultrasound guidance a Safe-T-Centesis catheter was introduced. Thoracentesis was performed. The catheter was removed and a dressing applied. FINDINGS: A total of approximately 2 L of amber fluid was removed. Samples were sent to the laboratory as requested by the clinical team. IMPRESSION: Successful ultrasound guided left thoracentesis yielding 2 L of pleural fluid. The procedure was terminated at this time secondary to his first time maximum of 2 L. Read by: Saverio Danker, PA-C Electronically Signed   By: Markus Daft M.D.   On: 10/25/2017 13:17    Pending Labs Unresulted Labs (From admission, onward)   Start     Ordered   10/25/17 1237  Amylase, pleural or peritoneal fluid  R     10/25/17 1237   10/25/17 1235  Body fluid culture  R     10/25/17 1235   10/25/17 1235  Albumin, pleural or peritoneal fluid  R     10/25/17 1235   10/25/17 1235  Protein, pleural or peritoneal fluid  R     10/25/17 1235   10/25/17 1235  Fungus Culture With Stain  R     10/25/17 1235   10/25/17 1235  Glucose, pleural or peritoneal fluid  R  10/25/17 1235   10/25/17 1235  PH, Body Fluid  R     10/25/17 1235   10/25/17 1235  Lactate dehydrogenase (pleural or peritoneal fluid)  R     10/25/17 1235   10/25/17 1235  Triglycerides, Body Fluid  R     10/25/17 1235   Signed and Held  CBC  (enoxaparin (LOVENOX)    CrCl >/= 30 ml/min)  Once,   R    Comments:  Baseline for enoxaparin therapy IF NOT ALREADY DRAWN.  Notify MD if PLT < 100 K.    Signed and Held   Signed and Held  Creatinine, serum  (enoxaparin  (LOVENOX)    CrCl >/= 30 ml/min)  Once,   R    Comments:  Baseline for enoxaparin therapy IF NOT ALREADY DRAWN.    Signed and Held   Signed and Held  Creatinine, serum  (enoxaparin (LOVENOX)    CrCl >/= 30 ml/min)  Weekly,   R    Comments:  while on enoxaparin therapy    Signed and Held   Signed and Held  Basic metabolic panel  Tomorrow morning,   R     Signed and Held   Signed and Held  CBC  Tomorrow morning,   R     Signed and Held      Vitals/Pain Today's Vitals   10/25/17 1312 10/25/17 1515 10/25/17 1530 10/25/17 1533  BP: 114/61 118/67 134/76 133/67  Pulse:    (!) 107  Resp: 19 (!) 25 (!) 26 20  Temp:      TempSrc:      SpO2: 95% 94% 96% 95%  Weight:      PainSc:        Isolation Precautions No active isolations  Medications Medications  lidocaine (XYLOCAINE) 1 % (with pres) injection (not administered)    Mobility walks

## 2017-10-25 NOTE — ED Triage Notes (Addendum)
Patient brought in from radiology with complaints of SOB and tachycardia. Patient with history of cancer and a patient of the cancer center. Patient has been receiving chemo for the past 2 years, and the patient came out of remission in Nov 2018. Patient was receiving PET scan and WL today when he was noted to be SOB and tachycardic. Patient reports rectal pain, but denies chest pain. Patient placed on [facility] in triage. Patient reports melena "for a couple months" with associated rectal pain. Patient's PET scan was to investigate his rectal swelling. Patient denies blood thinners.

## 2017-10-25 NOTE — ED Notes (Signed)
Patient transported to PET scan

## 2017-10-25 NOTE — Procedures (Signed)
Ultrasound-guided diagnostic and therapeutic left thoracentesis performed yielding 2 liters of amber colored fluid. No immediate complications. Follow-up chest x-ray pending.   Henreitta Cea 12:52 PM 10/25/2017

## 2017-10-26 ENCOUNTER — Other Ambulatory Visit: Payer: Self-pay | Admitting: Hematology

## 2017-10-26 DIAGNOSIS — C8318 Mantle cell lymphoma, lymph nodes of multiple sites: Secondary | ICD-10-CM

## 2017-10-26 LAB — BASIC METABOLIC PANEL WITH GFR
Anion gap: 9 (ref 5–15)
BUN: 12 mg/dL (ref 6–20)
CO2: 27 mmol/L (ref 22–32)
Calcium: 8.7 mg/dL — ABNORMAL LOW (ref 8.9–10.3)
Chloride: 102 mmol/L (ref 101–111)
Creatinine, Ser: 0.92 mg/dL (ref 0.61–1.24)
GFR calc Af Amer: >60 mL/min
GFR calc non Af Amer: >60 mL/min
Glucose, Bld: 160 mg/dL — ABNORMAL HIGH (ref 65–99)
Potassium: 3.5 mmol/L (ref 3.5–5.1)
Sodium: 138 mmol/L (ref 135–145)

## 2017-10-26 LAB — CBC
HCT: 38.1 % — ABNORMAL LOW (ref 39.0–52.0)
HEMOGLOBIN: 12.2 g/dL — AB (ref 13.0–17.0)
MCH: 29.7 pg (ref 26.0–34.0)
MCHC: 32 g/dL (ref 30.0–36.0)
MCV: 92.7 fL (ref 78.0–100.0)
Platelets: 305 10*3/uL (ref 150–400)
RBC: 4.11 MIL/uL — AB (ref 4.22–5.81)
RDW: 14 % (ref 11.5–15.5)
WBC: 14.3 10*3/uL — AB (ref 4.0–10.5)

## 2017-10-26 LAB — PHOSPHORUS: Phosphorus: 2.8 mg/dL (ref 2.5–4.6)

## 2017-10-26 LAB — TRIGLYCERIDES, BODY FLUIDS: Triglycerides, Fluid: 37 mg/dL

## 2017-10-26 LAB — URIC ACID: Uric Acid, Serum: 5.7 mg/dL (ref 4.4–7.6)

## 2017-10-26 LAB — LACTATE DEHYDROGENASE: LDH: 331 U/L — ABNORMAL HIGH (ref 98–192)

## 2017-10-26 MED ORDER — IBRUTINIB 560 MG PO TABS: 1.0000 | ORAL_TABLET | Freq: Every day | ORAL | 1 refills | Status: DC

## 2017-10-26 MED ORDER — SODIUM CHLORIDE 0.9 % IV SOLN
1500.0000 mg | Freq: Once | INTRAVENOUS | Status: AC
  Administered 2017-10-26: 1500 mg via INTRAVENOUS
  Filled 2017-10-26: qty 1500

## 2017-10-26 MED ORDER — PREDNISONE 50 MG PO TABS
60.0000 mg | ORAL_TABLET | Freq: Every day | ORAL | Status: DC
  Administered 2017-10-26 – 2017-10-27 (×2): 60 mg via ORAL
  Filled 2017-10-26 (×2): qty 1

## 2017-10-26 MED ORDER — VANCOMYCIN HCL IN DEXTROSE 1-5 GM/200ML-% IV SOLN
1000.0000 mg | Freq: Two times a day (BID) | INTRAVENOUS | Status: DC
  Administered 2017-10-27 – 2017-10-29 (×5): 1000 mg via INTRAVENOUS
  Filled 2017-10-26 (×5): qty 200

## 2017-10-26 NOTE — Progress Notes (Signed)
Pharmacy Antibiotic Note  Kristopher Johnson is a 62 y.o. male admitted on 10/25/2017 with GPC in pleural fluid.  Pharmacy has been consulted for vancomycin dosing.  Plan: 1) Vancomycin 1500mg  IV x 1 then 1g IV q12 (goal AUC 400-500) 2) Narrow vancomycin as soon as possible if not MRSA  Height: 5\' 10"  (177.8 cm) Weight: 182 lb 12.2 oz (82.9 kg) IBW/kg (Calculated) : 73  Temp (24hrs), Avg:98.7 F (37.1 C), Min:98.3 F (36.8 C), Max:99.3 F (37.4 C)  Recent Labs  Lab 10/25/17 0844 10/25/17 0918 10/26/17 0441  WBC 17.4*  --  14.3*  CREATININE 1.12 0.80 0.92    Estimated Creatinine Clearance: 84.9 mL/min (by C-G formula based on SCr of 0.92 mg/dL).    No Known Allergies   Thank you for allowing pharmacy to be a part of this patient's care.  Adrian Saran, PharmD, BCPS 10/26/2017 4:33 PM

## 2017-10-26 NOTE — Progress Notes (Signed)
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 Patient chooses not to pursue AutoHSCT based strategies which is quite reasonable considering his functional challenges with legal blindness and limited social support.  S/p 6 cycles of Bendamustine/Rituxan    HISTORY OF PRESENTING ILLNESS: plz see clinic note for details on initial presentation  Subjective  Kristopher Johnson presented to the emergency room last evening with increasing shortness of breath and dyspnea on exertion.  His PET/CT scan which was ordered as scheduled yesterday showed significant relapsed mantle cell lymphoma including significant left pleural effusion.  He had about 2 L of fluid removed from the left pleural effusion with significant improvement in his breathing.  His left pleural effusion appears to be malignant and related to his lymphoma. He is also bothered with rectal urgency from his sigmoid/rectal mass which is relapsed mantle cell. Notes significant night sweats fatigue and wt loss of 15-20 pounds over the last month.  I discussed with him starting steroids for symptomatic relief and to try to obtain Ibrutinib ASAP to get him started on this.  PAST MEDICAL HISTORY:  Past Medical History:  Diagnosis Date  . Abscess of arm, right 10/27/2015  . Lymphoma of lymph nodes of multiple sites ([place]) 10/27/2015  . 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 Shortness of breath 10/27/2015    SURGICAL  HISTORY: Past Surgical History:  Procedure Laterality Date  . Cataract surgery     Patient notes she has had bilateral Surgery in 1998 and 99  . FLEXIBLE SIGMOIDOSCOPY N/A 03/01/2017   Procedure: FLEXIBLE SIGMOIDOSCOPY;  Surgeon: Manus Gunning, MD;  Location: [facility];  Service: Gastroenterology;  Laterality: N/A;  . TONSILLECTOMY     about 62years old    SOCIAL HISTORY: Social History   Socioeconomic History  . Marital status: Widowed    Spouse name: Not on file  . Number of children: Not on file  . Years of education: Not on file  . Highest education level: Not on file  Social Needs  . Financial resource strain: Not on file  . Food insecurity - worry: Not on file  . Food insecurity - inability: Not on file  . Transportation needs - medical: Not on file  . Transportation needs - non-medical: Not on file  Occupational History  . Not on file  Tobacco Use  . Smoking status: Current Every Day Smoker    Packs/day: 1.00    Years: 33.00    Pack years: 33.00  . Smokeless tobacco: Never Used  Substance and Sexual Activity  . Alcohol use: No  . Drug use: No  . Sexual activity: Not on file    Comment: Disabled, retinis pigmentosa legally blind, 4children MPOA Lynnn(cousin)  Other Topics Concern  . Not on file  Social History Narrative  . Not on file    FAMILY HISTORY: Family History  Problem Relation Age of Onset  . Colon cancer Neg Hx     ALLERGIES:  has No Known Allergies.  MEDICATIONS:  Current Facility-Administered Medications  Medication Dose Route Frequency Provider Last Rate Last Dose  . acetaminophen (TYLENOL) tablet 650 mg  650 mg Oral Q6H PRN Dhungel, Nishant, MD       Or  . acetaminophen (TYLENOL) suppository 650 mg  650 mg Rectal Q6H PRN Dhungel, Nishant, MD      . enoxaparin (LOVENOX) injection 40 mg  40 mg Subcutaneous Q24H Dhungel, Nishant, MD      . ondansetron (ZOFRAN) tablet 4 mg  4 mg Oral Q6H PRN Dhungel, Nishant, MD       Or  .  ondansetron (ZOFRAN) injection 4 mg  4 mg Intravenous Q6H PRN Dhungel, Nishant, MD      . predniSONE (DELTASONE) tablet 60 mg  60 mg Oral Q breakfast Irene Limbo, Cloria Spring, MD        REVIEW OF SYSTEMS:    A 10+ POINT REVIEW OF SYSTEMS WAS OBTAINED including neurology, dermatology, psychiatry, cardiac, respiratory, lymph, extremities, GI, GU, Musculoskeletal, constitutional, breasts, reproductive, HEENT.  All pertinent positives are noted in the HPI.  All others are negative.  PHYSICAL EXAMINATION:  ECOG PERFORMANCE STATUS: 2 - Symptomatic, <50% confined to bed .VS as noted in [place], in no acute distress and comfortable SKIN: some nodular skin lesions over extremities -much improved OROPHARYNX:no exudate, no erythema and lips, multiple carious teeth with significant periodontal disease. NECK: supple, no JVD, thyroid normal size, non-tender, without nodularity LYMPH:  no palpable lymphadenopathy in the cervical, axillary or inguinal LUNGS: clear to auscultation with normal respiratory effort HEART: regular rate & rhythm,  no murmurs and no lower extremity edema ABDOMEN: abdomen soft, non-tender, normoactive bowel sounds  Musculoskeletal: no cyanosis of digits and no clubbing  PSYCH: alert & oriented x 3 with fluent speech NEURO: no focal motor/sensory deficits  LABORATORY DATA:  I have reviewed the data as listed   CBC Latest Ref Rng & Units 10/26/2017 10/25/2017 10/25/2017  WBC 4.0 - 10.5 K/uL 14.3(H) - 17.4(H)  Hemoglobin 13.0 - 17.0 g/dL 12.2(L) 15.6 13.2  Hematocrit 39.0 - 52.0 % 38.1(L) 46.0 40.8  Platelets 150 - 400 K/uL 305 - 352    . CMP Latest Ref Rng & Units 10/26/2017 10/25/2017 10/25/2017  Glucose 65 - 99 mg/dL 160(H) 154(H) 155(H)  BUN 6 - 20 mg/dL 12 12 13   Creatinine 0.61 - 1.24 mg/dL 0.92 0.80 1.12  Sodium 135 - 145 mmol/L 138 139 139  Potassium 3.5 - 5.1 mmol/L 3.5 3.6 4.1  Chloride 101 - 111 mmol/L 102 99(L) 100(L)  CO2 22 - 32 mmol/L 27 - 26  Calcium  8.9 - 10.3 mg/dL 8.7(L) - 9.1  Total Protein 6.5 - 8.1 g/dL - - 6.9  Total Bilirubin 0.3 - 1.2 mg/dL - - 1.4(H)  Alkaline Phos 38 - 126 U/L - - 79  AST 15 - 41 U/L - - 27  ALT 17 - 63 U/L - - 18     RADIOGRAPHIC STUDIES: I have personally reviewed the radiological images as listed and agreed with the findings in the report. Dg Chest 1 View  Result Date: 10/25/2017 CLINICAL DATA:  Left pleural effusion.  Status post thoracentesis. EXAM: CHEST 1 VIEW 1 p.m. COMPARISON:  10/25/2017 8:51 a.m. and PET-CT dated 06/19/2017 FINDINGS: There has been reduction in the size of the large left pleural effusion. Moderate effusion persists. No pneumothorax. There is abnormal soft tissue density at the right lung base medially which could represent a mass. There is slight focal pleural thickening laterally at the  right lung base which is felt represent loculated fluid. Heart size and vascularity appear normal.  Power port in place. IMPRESSION: 1. No pneumothorax after left thoracentesis. 2. Moderate residual left effusion. 3. Abnormal soft tissue density at the right base medially which could represent a mass. This was not present on the PET-CT dated 06/19/2017. Electronically Signed   By: Lorriane Shire M.D.   On: 10/25/2017 13:16   Nm Pet Image Restag (ps) Skull Base To Thigh  Result Date: 10/25/2017 CLINICAL DATA:  Subsequent treatment strategy for stage IV mantle cell lymphoma. EXAM: NUCLEAR MEDICINE PET SKULL BASE TO THIGH TECHNIQUE: 10.2 mCi F-18 FDG was injected intravenously. Full-ring PET imaging was performed from the skull base to thigh after the radiotracer. CT data was obtained and used for attenuation correction and anatomic localization. FASTING BLOOD GLUCOSE:  Value: 103 mg/dl COMPARISON:  06/19/2017 FINDINGS: NECK: No hypermetabolic lymph nodes in the neck. CHEST: Marked interval progression of disease in the chest. There is bulky right axillary and subpectoral lymphadenopathy. Hypermetabolic  lymphadenopathy identified in the left thoracic inlet, mediastinum, and hilar regions. Interval development of bulky pleural disease bilaterally. Index right axillary lymph node (image 55 series 4) measures 2.2 cm with SUV max = 12.8. Anterior left pleural mass versus markedly enlarged internal mammary lymph node measuring 3.7 cm (image 63 series 4) demonstrates SUV max = 15.2. 6.0 x 3.2 cm mass lesion in the medial right lung base, adjacent to the heart is probably the same lesion measured on the abdomen and pelvis CT of 02/28/2017 at 2.8 x 1.9 cm. This is markedly hypermetabolic with SUV max = 31.4. Hypermetabolic left anterior pleural lesion extends into the chest wall on image 84 series 4. Tiny right and moderate left pleural effusions are evident. Right Port-A-Cath tip is in the distal SVC. ABDOMEN/PELVIS: There is omental and/or colonic wall nodular hypermetabolism. 1.4 x 2.0 cm nodule contiguous with the wall of the hepatic flexure (image 139 series 4) is hypermetabolic with SUV max = 97.0. Similar nodule along the distal transverse colon (image 144 series 4) is hypermetabolic. Hypermetabolic lymph node posterior to the stomach is identified on image 141. A large 3.2 x 2.7 cm soft tissue lesion anterior to the right psoas muscle on image 148 of series 4 demonstrates SUV max = 18.5. Other scattered hypermetabolic lymphadenopathy is seen in the retroperitoneum and pelvic sidewall. Marked interval progression of the distal sigmoid/rectal mass with appearance now similar back to the study from 02/28/2017. Atherosclerotic calcification noted in the abdominal aorta. No evidence for bowel obstruction. Large bilateral groin hernias contain small bowel loops without complicating features. SKELETON: Hypermetabolic nodules are seen in the subcutaneous fat of the anterior abdominal wall. There is a hypermetabolic lesion in the upper left rectus musculature (image 103 series 4). Other scattered hypermetabolic nodules are  seen in the musculoskeletal anatomy. A 19 mm nodule is seen in the quadriceps musculature of the proximal left thigh. IMPRESSION: 1. Marked interval progression of hypermetabolic disease in the chest, abdomen, and pelvis. There is hypermetabolic lymphadenopathy in the chest with very marked bilateral pleural disease including a left anterior pleural-based lesion that invades the chest wall. Hypermetabolic lymphadenopathy is seen in the abdomen/pelvis with multiple hypermetabolic lesions along the surface of the colon and the distal sigmoid/rectal lesion has progressed substantially in the interval and is now similar in appearance to the previous CT scan of 02/28/2017. Diffuse subcutaneous and musculoskeletal soft tissue hypermetabolic lesions are associated. 2. Tiny right and moderate left pleural effusions.  3.  Aortic Atherosclerois (ICD10-170.0) 4. Large bilateral groin hernias contain small bowel without complicating features. Electronically Signed   By: Misty Stanley M.D.   On: 10/25/2017 16:04   Dg Chest Port 1 View  Result Date: 10/25/2017 CLINICAL DATA:  Shortness of breath.  History of lymphoma EXAM: PORTABLE CHEST 1 VIEW COMPARISON:  PET-CT June 19, 2017. FINDINGS: Port-A-Cath tip is in superior vena cava. Humor needle is placed in the port. No pneumothorax. There is a large left pleural effusion with consolidation left mid lower lung zones. The right lung is clear. Heart appears mildly enlarged. Visualized pulmonary vascularity is normal. No adenopathy evident in regions that can be assessed. No bone lesions. IMPRESSION: Sizable pleural effusion with left mid and lower lung zone consolidation. Right lung clear. Heart appears prominent. Port-A-Cath tip in superior vena cava. No pneumothorax. Electronically Signed   By: Lowella Grip III M.D.   On: 10/25/2017 09:25   US Thoracentesis Asp Pleural Space W/img Guide  Result Date: 10/25/2017 INDICATION: Mantle cell lymphoma with a left-sided  pleural effusion. Request is made for diagnostic and therapeutic thoracentesis. EXAM: ULTRASOUND GUIDED DIAGNOSTIC AND THERAPEUTIC THORACENTESIS MEDICATIONS: 1% lidocaine COMPLICATIONS: None immediate. PROCEDURE: An ultrasound guided thoracentesis was thoroughly discussed with the patient and questions answered. The benefits, risks, alternatives and complications were also discussed. The patient understands and wishes to proceed with the procedure. Written consent was obtained. Ultrasound was performed to localize and mark an adequate pocket of fluid in the left chest. The area was then prepped and draped in the normal sterile fashion. 1% Lidocaine was used for local anesthesia. Under ultrasound guidance a Safe-T-Centesis catheter was introduced. Thoracentesis was performed. The catheter was removed and a dressing applied. FINDINGS: A total of approximately 2 L of amber fluid was removed. Samples were sent to the laboratory as requested by the clinical team. IMPRESSION: Successful ultrasound guided left thoracentesis yielding 2 L of pleural fluid. The procedure was terminated at this time secondary to his first time maximum of 2 L. Read by: Saverio Danker, PA-C Electronically Signed   By: Markus Daft M.D.   On: 10/25/2017 13:17    ECHO: 11/01/2015: Study Conclusions  - Left ventricle: The cavity size was normal. There was mild concentric hypertrophy. Systolic function was normal. The estimated ejection fraction was in the range of 50% to 55%. Wall motion was normal; there were no regional wall motion abnormalities. Doppler parameters are consistent with abnormal left ventricular relaxation (grade 1 diastolic dysfunction). - Aortic valve: Transvalvular velocity was within the normal range. There was no stenosis. There was no regurgitation. - Mitral valve: There was no regurgitation. - Right ventricle: The cavity size was normal. Wall thickness was normal. Systolic function was normal. -  Tricuspid valve: There was no regurgitation. - Inferior vena cava: The vessel was normal in size. The respirophasic diameter changes were in the normal range (>= 50%), consistent with normal central venous pressure.Study Conclusions  - Left ventricle: The cavity size was normal. There was mild concentric hypertrophy. Systolic function was normal. The estimated ejection fraction was in the range of 50% to 55%. Wall motion was normal; there were no regional wall motion abnormalities. Doppler parameters are consistent with abnormal left ventricular relaxation (grade 1 diastolic dysfunction). - Aortic valve: Transvalvular velocity was within the normal range. There was no stenosis. There was no regurgitation. - Mitral valve: There was no regurgitation. - Right ventricle: The cavity size was normal. Wall thickness was normal. Systolic function was normal. -  Tricuspid valve: There was no regurgitation. - Inferior vena cava: The vessel was normal in size. The respirophasic diameter changes were in the normal range (>= 50%), consistent with normal central venous pressure.  ASSESSMENT & PLAN:    62 yo man with   1) h/o previous Stage IVB E Mantle Cell lymphoma with likely gastric involvement, extensive LNadenopathy. ECHO nl EF ECOG PS 2 but activities significantly limited due to being legally blind HIV/Hep C/Hep B neg Baseline LDH 450--->135-->163-->144 . Lab Results  Component Value Date   LDH 227 09/17/2017   PET/CT scan after 3 cycles of R CHOP show good overall response. Stomach lesion still with very FDG uptake ? Residual lymphoma versus inflammation.  Patient is status post 6 cycles of R CHOP. PET/CT after 6 cycles shows resolution of hypermetabolic lymphadenopathy and gastric wall activity . Patient was on maintenance Rituxan for 1 year now prior to relapse.   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 CT chest abdomen pelvis showed multiple areas of lymphadenopathy in the abdomen as well as in the chest suggesting significant recurrence. PET/CT scan 03/25/2017 shows diffuse involvement with mantle cell lymphoma involving the chest abdomen pelvis and bowels. PET/ct 06/19/2017-- showed significant partial response to BR treatment PET/CT 10/25/2017 - with significant mantle cell lymphoma progression again.  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 6) Shortness related to symptomatic pleural effusion. PLAN -I discussed all the labs and imaging findings with the patient in details and answered all his relevant questions. -We discussed that he clearly has significant progression of his pleomorphic mantle cell lymphoma which is quite symptomatic at this time with significant type B symptoms as well. -We shall start him on prednisone 60 mg p.o. daily with breakfast to try to help some of his constitutional symptoms and perhaps keep a slight lid on his lymphoma pending availability of Therapy. -I will put an urgent request in to start him on third line ibrutinib 560 mg p.o. Daily.  Might consider combining this with Dyann Kief (if possible -- progressed on Rituxan) -will continue to follow   7) Retinitis pigmentosa causing legal blindness -  is seeing a retina specialist in town. No acute interventions at this time. Notes that his vision is progressively getting worse. His daughter and granddaughter are helping him out at home.   8) Weight loss and protein calorie malnutrition. Resolved. Weight has improved significantly since his diagnosis and back to his baseline weight.  Wt Readings from Last 3 Encounters:  10/25/17 182 lb 12.2 oz (82.9 kg)  09/17/17 204 lb 3.2 oz (92.6 kg)  08/06/17 203  lb 8 oz (92.3 kg)   4) h/o Peri-orbital dermatitis /Conjuncitivitis - patient was seen by the dermatologist and given topical desonide ointment which led to the resolution of his dermatitis. There were not sure about the etiology but noted then to could not rule out Rituxan is a possibility. Currently his dermatitis has resolved and his conjunctivitis has resolved. He did not have rash anywhere else at the time. No oral sores or other toxicities. This happened about 4 days after Rituxan another chemotherapy. This has completely resolved and patient has not had an further issues with this with several additional doses of Rituxan.  5) Patient Active Problem List   Diagnosis Date Noted  .  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 Pleural effusion on left 10/25/2017  . Counseling regarding advanced care planning and goals of care 03/13/2017  . Rectal mass 03/02/2017  . Colon cancer metastasized to liver and lung([facility]) 03/02/2017  . Rectal bleeding 02/28/2017  . Portacath in place 01/30/2016  . Skin lesion of chest wall 12/19/2015  . Shingles 12/19/2015  . Rash 11/28/2015  . 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 Mantle cell lymphoma of lymph nodes of multiple regions ([place]) 11/01/2015  . Protein-calorie malnutrition ([place]) 11/01/2015  . Abscess of arm, right 10/27/2015  . Lymphoma of lymph nodes of multiple sites ([place]) 10/27/2015  . Shortness of breath 10/27/2015  . Shortness of breath dyspnea 10/27/2015  . Hypoalbuminemia 10/25/2015  . Elevated lipase 10/25/2015  . Inguinal hernia 10/25/2015  . Urinary retention 10/25/2015  . Abdominal pain   . Lymphadenopathy 10/24/2015   I spent 30 minutes counseling the patient face to face. The total time spent in the appointment was 40 minutes and more than 50% was on counseling and direct patient cares.  Sullivan Lone MD [place] AAHIVMS [facility] Hematology/Oncology Physician [facility]  (Office):       [phone] (Work cell):  [phone] (Fax):            [phone]

## 2017-10-26 NOTE — Plan of Care (Signed)
Dr. Text to inform about Pleura culture: positive for anerobic gram positive cocci.

## 2017-10-26 NOTE — Progress Notes (Signed)
Patient ID: NICHOLUS CHANDRAN, male   DOB: 1956-09-27, 62 y.o.   MRN: [id number]    PROGRESS NOTE  AUNDREA HIGGINBOTHAM  WEX:[id number] DOB: [birth year] DOA: 10/25/2017  PCP: Patient, No Pcp Per   Brief Narrative:  62 y.o. male, with stage IV mantle cell lymphoma diagnosed in 10/2015, status post R-CHOP 6 cycles followed by maintenance Rituxan, awaiting PET scan, retinitis pigmentosa with legal blindness was sent to the ED from PET scan after pt had increasing shortness of breath.  Course in the ED neck line patient was tachycardic low 100s, tachypneic, O2 sat dropped to 80% on room air, afebrile. Chest x-ray showed large left-sided pleural effusion with possibleconsolidation. Hospitalist consulted for management of his large (likely malignant) pleural effusion   Assessment & Plan:   Principal Problem:   Malignant pleural effusion - s/p left thoracentesis - pt reports feeling better - preliminary culture with g+ cocci - will start empiric Vanc until we have more results back   Active Problems:   Shortness of breath - secondary to the above  - reports feeling better     Mantle cell lymphoma of lymph nodes of multiple regions ([place])    Protein-calorie malnutrition ([place]) - severe in the setting of malignancy   DVT prophylaxis: Lovenox  Code Status: Full  Family Communication: Patient at bedside  Disposition Plan: to be determined   Consultants:   IR  Procedures:   Left thoracentesis   Antimicrobials:   Vancomycin 10/26/2017 -->  Subjective: Pt reports feeling better.   Objective: Vitals:   10/25/17 1631 10/25/17 2250 10/26/17 0608 10/26/17 1420  BP: 111/66 117/60 117/61 (!) 120/59  Pulse: (!) 109 (!) 111 (!) 109 (!) 101  Resp: 20 18 16 18   Temp: 97.9 F (36.6 C) 99.3 F (37.4 C) 98.6 F (37 C) 98.3 F (36.8 C)  TempSrc: Oral Oral Oral   SpO2: 95% 94% 93% 92%  Weight: 82.9 kg (182 lb 12.2 oz)     Height: 5\' 10"  (1.778 m)       Intake/Output Summary (Last 24 hours)  at 10/26/2017 1622 Last data filed at 10/26/2017 0600 Gross per 24 hour  Intake -  Output 200 ml  Net -200 ml   Filed Weights   10/25/17 0838 10/25/17 1631  Weight: 92.5 kg (204 lb) 82.9 kg (182 lb 12.2 oz)    Examination:  General exam: Appears calm and comfortable  Respiratory system:  Respiratory effort normal. Cardiovascular system: S1 & S2 heard, RRR. No JVD, murmurs, rubs, gallops or clicks. No pedal edema. Gastrointestinal system: Abdomen is nondistended, soft and nontender. No organomegaly or masses felt. Central nervous system: Alert and oriented. No focal neurological deficits.  Data Reviewed: I have personally reviewed following labs and imaging studies  CBC: Recent Labs  Lab 10/25/17 0844 10/25/17 0918 10/26/17 0441  WBC 17.4*  --  14.3*  NEUTROABS 15.2*  --   --   HGB 13.2 15.6 12.2*  HCT 40.8 46.0 38.1*  MCV 91.9  --  92.7  PLT 352  --  938   Basic Metabolic Panel: Recent Labs  Lab 10/25/17 0844 10/25/17 0918 10/26/17 0441  NA 139 139 138  K 4.1 3.6 3.5  CL 100* 99* 102  CO2 26  --  27  GLUCOSE 155* 154* 160*  BUN 13 12 12   CREATININE 1.12 0.80 0.92  CALCIUM 9.1  --  8.7*  PHOS  --   --  2.8   Liver Function Tests: Recent Labs  Lab 10/25/17 0844  AST 27  ALT 18  ALKPHOS 79  BILITOT 1.4*  PROT 6.9  ALBUMIN 3.1*   Coagulation Profile: Recent Labs  Lab 10/25/17 0844  INR 1.02   CBG: Recent Labs  Lab 10/25/17 0834 10/25/17 1339  GLUCAP 164* 103*   Urine analysis:    Component Value Date/Time   COLORURINE YELLOW 10/24/2015 1705   APPEARANCEUR CLOUDY (A) 10/24/2015 1705   LABSPEC 1.024 10/24/2015 1705   PHURINE 5.0 10/24/2015 1705   GLUCOSEU NEGATIVE 10/24/2015 1705   HGBUR NEGATIVE 10/24/2015 1705   [place] 10/24/2015 [place] 10/24/2015 1705   PROTEINUR 30 (A) 10/24/2015 1705   NITRITE NEGATIVE 10/24/2015 1705   LEUKOCYTESUR NEGATIVE 10/24/2015 1705   Recent Results (from the past 240  hour(s))  Culture, body fluid-bottle     Status: None (Preliminary result)   Collection Time: 10/25/17 12:33 PM  Result Value Ref Range Status   Specimen Description FLUID PLEURAL  Final   Special Requests NONE  Final   Gram Stain   Final    GRAM POSITIVE COCCI ANAEROBIC BOTTLE ONLY CRITICAL RESULT CALLED TO, READ BACK BY AND VERIFIED WITH: Bettina Gavia RN, AT [phone] 10/26/17 BY Rush Landmark Performed at [facility], [place] [address] [place], [place] [address]    Culture GRAM POSITIVE COCCI  Final   Report Status PENDING  Incomplete  Gram stain     Status: None   Collection Time: 10/25/17 12:33 PM  Result Value Ref Range Status   Specimen Description FLUID  Final   Special Requests NONE  Final   Gram Stain   Final    ABUNDANT WBC PRESENT,BOTH PMN AND MONONUCLEAR NO ORGANISMS SEEN Performed at [facility], [address], [place], [place] [address]    Report Status 10/25/2017 FINAL  Final      Radiology Studies: Dg Chest 1 View  Result Date: 10/25/2017 CLINICAL DATA:  Left pleural effusion.  Status post thoracentesis. EXAM: CHEST 1 VIEW 1 p.m. COMPARISON:  10/25/2017 8:51 a.m. and PET-CT dated 06/19/2017 FINDINGS: There has been reduction in the size of the large left pleural effusion. Moderate effusion persists. No pneumothorax. There is abnormal soft tissue density at the right lung base medially which could represent a mass. There is slight focal pleural thickening laterally at the right lung base which is felt represent loculated fluid. Heart size and vascularity appear normal.  Power port in place. IMPRESSION: 1. No pneumothorax after left thoracentesis. 2. Moderate residual left effusion. 3. Abnormal soft tissue density at the right base medially which could represent a mass. This was not present on the PET-CT dated 06/19/2017. Electronically Signed   By: Lorriane Shire M.D.   On: 10/25/2017 13:16   Nm Pet Image Restag (ps) Skull Base To Thigh  Result Date:  10/25/2017 CLINICAL DATA:  Subsequent treatment strategy for stage IV mantle cell lymphoma. EXAM: NUCLEAR MEDICINE PET SKULL BASE TO THIGH TECHNIQUE: 10.2 mCi F-18 FDG was injected intravenously. Full-ring PET imaging was performed from the skull base to thigh after the radiotracer. CT data was obtained and used for attenuation correction and anatomic localization. FASTING BLOOD GLUCOSE:  Value: 103 mg/dl COMPARISON:  06/19/2017 FINDINGS: NECK: No hypermetabolic lymph nodes in the neck. CHEST: Marked interval progression of disease in the chest. There is bulky right axillary and subpectoral lymphadenopathy. Hypermetabolic lymphadenopathy identified in the left thoracic inlet, mediastinum, and hilar regions. Interval development of bulky pleural disease bilaterally. Index right  axillary lymph node (image 55 series 4) measures 2.2 cm with SUV max = 12.8. Anterior left pleural mass versus markedly enlarged internal mammary lymph node measuring 3.7 cm (image 63 series 4) demonstrates SUV max = 15.2. 6.0 x 3.2 cm mass lesion in the medial right lung base, adjacent to the heart is probably the same lesion measured on the abdomen and pelvis CT of 02/28/2017 at 2.8 x 1.9 cm. This is markedly hypermetabolic with SUV max = 95.2. Hypermetabolic left anterior pleural lesion extends into the chest wall on image 84 series 4. Tiny right and moderate left pleural effusions are evident. Right Port-A-Cath tip is in the distal SVC. ABDOMEN/PELVIS: There is omental and/or colonic wall nodular hypermetabolism. 1.4 x 2.0 cm nodule contiguous with the wall of the hepatic flexure (image 139 series 4) is hypermetabolic with SUV max = 84.1. Similar nodule along the distal transverse colon (image 144 series 4) is hypermetabolic. Hypermetabolic lymph node posterior to the stomach is identified on image 141. A large 3.2 x 2.7 cm soft tissue lesion anterior to the right psoas muscle on image 148 of series 4 demonstrates SUV max = 18.5. Other  scattered hypermetabolic lymphadenopathy is seen in the retroperitoneum and pelvic sidewall. Marked interval progression of the distal sigmoid/rectal mass with appearance now similar back to the study from 02/28/2017. Atherosclerotic calcification noted in the abdominal aorta. No evidence for bowel obstruction. Large bilateral groin hernias contain small bowel loops without complicating features. SKELETON: Hypermetabolic nodules are seen in the subcutaneous fat of the anterior abdominal wall. There is a hypermetabolic lesion in the upper left rectus musculature (image 103 series 4). Other scattered hypermetabolic nodules are seen in the musculoskeletal anatomy. A 19 mm nodule is seen in the quadriceps musculature of the proximal left thigh. IMPRESSION: 1. Marked interval progression of hypermetabolic disease in the chest, abdomen, and pelvis. There is hypermetabolic lymphadenopathy in the chest with very marked bilateral pleural disease including a left anterior pleural-based lesion that invades the chest wall. Hypermetabolic lymphadenopathy is seen in the abdomen/pelvis with multiple hypermetabolic lesions along the surface of the colon and the distal sigmoid/rectal lesion has progressed substantially in the interval and is now similar in appearance to the previous CT scan of 02/28/2017. Diffuse subcutaneous and musculoskeletal soft tissue hypermetabolic lesions are associated. 2. Tiny right and moderate left pleural effusions. 3.  Aortic Atherosclerois (ICD10-170.0) 4. Large bilateral groin hernias contain small bowel without complicating features. Electronically Signed   By: Misty Stanley M.D.   On: 10/25/2017 16:04   Dg Chest Port 1 View  Result Date: 10/25/2017 CLINICAL DATA:  Shortness of breath.  History of lymphoma EXAM: PORTABLE CHEST 1 VIEW COMPARISON:  PET-CT June 19, 2017. FINDINGS: Port-A-Cath tip is in superior vena cava. Humor needle is placed in the port. No pneumothorax. There is a large  left pleural effusion with consolidation left mid lower lung zones. The right lung is clear. Heart appears mildly enlarged. Visualized pulmonary vascularity is normal. No adenopathy evident in regions that can be assessed. No bone lesions. IMPRESSION: Sizable pleural effusion with left mid and lower lung zone consolidation. Right lung clear. Heart appears prominent. Port-A-Cath tip in superior vena cava. No pneumothorax. Electronically Signed   By: Lowella Grip III M.D.   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 Request is made for diagnostic and therapeutic thoracentesis. EXAM: ULTRASOUND GUIDED DIAGNOSTIC AND THERAPEUTIC  THORACENTESIS MEDICATIONS: 1% lidocaine COMPLICATIONS: None immediate. PROCEDURE: An ultrasound guided thoracentesis was thoroughly discussed with the patient and questions answered. The benefits, risks, alternatives and complications were also discussed. The patient understands and wishes to proceed with the procedure. Written consent was obtained. Ultrasound was performed to localize and mark an adequate pocket of fluid in the left chest. The area was then prepped and draped in the normal sterile fashion. 1% Lidocaine was used for local anesthesia. Under ultrasound guidance a Safe-T-Centesis catheter was introduced. Thoracentesis was performed. The catheter was removed and a dressing applied. FINDINGS: A total of approximately 2 L of amber fluid was removed. Samples were sent to the laboratory as requested by the clinical team. IMPRESSION: Successful ultrasound guided left thoracentesis yielding 2 L of pleural fluid. The procedure was terminated at this time secondary to his first time maximum of 2 L. Read by: Saverio Danker, PA-C Electronically Signed   By: Markus Daft M.D.   On: 10/25/2017 13:17    Scheduled Meds: . enoxaparin (LOVENOX) injection  40 mg  Subcutaneous Q24H  . predniSONE  60 mg Oral Q breakfast   Continuous Infusions:   LOS: 1 day   Time spent: 25 minutes    Faye Ramsay, MD [facility] Pager [phone]  If 7PM-7AM, please contact night-coverage [email or web address] Password [facility] 10/26/2017, 4:22 PM

## 2017-10-27 ENCOUNTER — Inpatient Hospital Stay (HOSPITAL_COMMUNITY): Payer: Medicare Other

## 2017-10-27 LAB — BASIC METABOLIC PANEL
ANION GAP: 7 (ref 5–15)
BUN: 11 mg/dL (ref 6–20)
CHLORIDE: 105 mmol/L (ref 101–111)
CO2: 26 mmol/L (ref 22–32)
Calcium: 8.8 mg/dL — ABNORMAL LOW (ref 8.9–10.3)
Creatinine, Ser: 0.79 mg/dL (ref 0.61–1.24)
GFR calc Af Amer: >60 mL/min (ref >60)
GFR calc non Af Amer: >60 mL/min (ref >60)
GLUCOSE: 182 mg/dL — AB (ref 65–99)
Potassium: 3.5 mmol/L (ref 3.5–5.1)
Sodium: 138 mmol/L (ref 135–145)

## 2017-10-27 LAB — PH, BODY FLUID: pH, Body Fluid: 7.5

## 2017-10-27 LAB — CBC
HEMATOCRIT: 37.1 % — AB (ref 39.0–52.0)
HEMOGLOBIN: 11.7 g/dL — AB (ref 13.0–17.0)
MCH: 28.9 pg (ref 26.0–34.0)
MCHC: 31.5 g/dL (ref 30.0–36.0)
MCV: 91.6 fL (ref 78.0–100.0)
Platelets: 290 10*3/uL (ref 150–400)
RBC: 4.05 MIL/uL — AB (ref 4.22–5.81)
RDW: 13.8 % (ref 11.5–15.5)
WBC: 16.1 10*3/uL — AB (ref 4.0–10.5)

## 2017-10-27 MED ORDER — DEXAMETHASONE 4 MG PO TABS
4.0000 mg | ORAL_TABLET | Freq: Two times a day (BID) | ORAL | Status: DC
  Administered 2017-10-27 – 2017-10-31 (×9): 4 mg via ORAL
  Filled 2017-10-27 (×10): qty 1

## 2017-10-27 MED ORDER — OXYCODONE HCL 5 MG PO TABS: 5.0000 mg | ORAL_TABLET | ORAL | Status: DC | PRN

## 2017-10-27 MED ORDER — SENNOSIDES-DOCUSATE SODIUM 8.6-50 MG PO TABS
1.0000 | ORAL_TABLET | Freq: Two times a day (BID) | ORAL | Status: DC
  Administered 2017-10-27 – 2017-10-30 (×6): 1 via ORAL
  Filled 2017-10-27 (×6): qty 1

## 2017-10-27 NOTE — Progress Notes (Addendum)
Patient ID: Kristopher Johnson, male   DOB: October 10, 1955, 62 y.o.   MRN: [id number]    PROGRESS NOTE  DEUCE PATERNOSTER  IWL:[id number] DOB: Feb 27, 1956 DOA: 10/25/2017  PCP: Patient, No Pcp Per   Brief Narrative:  62 y.o. male, with stage IV mantle cell lymphoma diagnosed in 10/2015, status post R-CHOP 6 cycles followed by maintenance Rituxan, awaiting PET scan, retinitis pigmentosa with legal blindness was sent to the ED from PET scan after pt had increasing shortness of breath.  Course in the ED neck line patient was tachycardic low 100s, tachypneic, O2 sat dropped to 80% on room air, afebrile. Chest x-ray showed large left-sided pleural effusion with possibleconsolidation. 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 Irene Limbo, appreciate his assistance  - management per Dr. 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 Irene Limbo, also recommended stool softeners     Protein-calorie malnutrition ([place]) - in the setting of malignancy  - liberalize diet   DVT prophylaxis: Lovenox  Code Status: Full  Family Communication: pt at bedside  Disposition Plan: to be determined   Consultants:    IR  Procedures:   Left thoracentesis   Antimicrobials:   Vancomycin 10/26/2017 -->  Subjective: Pt reports ongoing rectal pain.   Objective: Vitals:   10/26/17 1420 10/26/17 2141 10/26/17 2203 10/27/17 0550  BP: (!) 120/59  108/62 113/60  Pulse: (!) 101 (!) 108 (!) 103 85  Resp: 18  16 16   Temp: 98.3 F (36.8 C)  98.4 F (36.9 C) 98.1 F (36.7 C)  TempSrc:   Oral Oral  SpO2: 92%  90% 92%  Weight:      Height:        Intake/Output Summary (Last 24 hours) at 10/27/2017 0844 Last data filed at 10/27/2017 1740 Gross per 24 hour  Intake 1420 ml  Output -  Net 1420 ml   Filed Weights   10/25/17 0838 10/25/17 1631  Weight: 92.5 kg (204 lb) 82.9 kg (182 lb 12.2 oz)    Physical Exam  Constitutional: Appears calm, NAD CVS: RRR, S1/S2 +, no murmurs, no gallops, no carotid bruit.  Pulmonary: Effort and breath sounds normal, diminished breath sounds at bases  Abdominal: Soft. BS +,  no distension, tenderness, rebound or guarding.  Neuro: Alert. Normal reflexes, muscle tone coordination. No cranial nerve deficit.  Data Reviewed: I have personally reviewed following labs and imaging studies  CBC: Recent Labs  Lab 10/25/17 0844 10/25/17 0918 10/26/17 0441 10/27/17 0404  WBC 17.4*  --  14.3* 16.1*  NEUTROABS 15.2*  --   --   --   HGB 13.2 15.6 12.2* 11.7*  HCT 40.8 46.0 38.1* 37.1*  MCV 91.9  --  92.7 91.6  PLT 352  --  305 016   Basic Metabolic Panel: Recent Labs  Lab 10/25/17 0844 10/25/17 0918 10/26/17 0441 10/27/17 0404  NA 139 139 138 138  K 4.1 3.6 3.5 3.5  CL 100* 99* 102 105  CO2 26  --  27 26  GLUCOSE 155* 154* 160* 182*  BUN 13 12 12 11   CREATININE 1.12 0.80 0.92 0.79  CALCIUM 9.1  --  8.7* 8.8*  PHOS  --   --  2.8  --    Liver Function Tests: Recent Labs  Lab 10/25/17 0844  AST 27  ALT 18  ALKPHOS 79  BILITOT 1.4*  PROT 6.9  ALBUMIN 3.1*   Coagulation Profile: Recent Labs  Lab 10/25/17 0844  INR 1.02   CBG: Recent Labs   Lab 10/25/17 0834 10/25/17 1339  GLUCAP 164* 103*   Urine analysis:    Component Value Date/Time   COLORURINE YELLOW 10/24/2015 1705   APPEARANCEUR CLOUDY (A) 10/24/2015 1705   LABSPEC 1.024 10/24/2015 1705   PHURINE 5.0 10/24/2015 1705   GLUCOSEU NEGATIVE 10/24/2015 1705   HGBUR NEGATIVE 10/24/2015 1705   [place] 10/24/2015 [place] 10/24/2015 1705   PROTEINUR 30 (A) 10/24/2015 1705   NITRITE NEGATIVE 10/24/2015 1705   LEUKOCYTESUR NEGATIVE 10/24/2015 1705   Recent Results (from the past 240 hour(s))  Culture, body fluid-bottle     Status: None (Preliminary result)   Collection Time: 10/25/17 12:33 PM  Result Value Ref Range Status   Specimen Description FLUID PLEURAL  Final   Special Requests NONE  Final   Gram Stain   Final    GRAM POSITIVE COCCI ANAEROBIC BOTTLE ONLY CRITICAL RESULT CALLED TO, READ BACK BY AND VERIFIED WITH: Bettina Gavia RN, AT [phone] 10/26/17 BY Rush Landmark Performed at [facility], [place] [address] [place], [place] [address]    Culture GRAM POSITIVE COCCI  Final   Report Status PENDING  Incomplete  Gram stain     Status: None   Collection Time: 10/25/17 12:33 PM  Result Value Ref Range Status   Specimen Description FLUID  Final   Special Requests NONE  Final   Gram Stain   Final    ABUNDANT WBC PRESENT,BOTH PMN AND MONONUCLEAR NO ORGANISMS SEEN Performed at [facility], [address], [place], [place] [address]    Report Status 10/25/2017 FINAL  Final    Radiology Studies: Dg Chest 1 View  Result Date: 10/25/2017 CLINICAL DATA:  Left pleural effusion.  Status post thoracentesis. EXAM: CHEST 1 VIEW 1 p.m. COMPARISON:  10/25/2017 8:51 a.m. and PET-CT dated 06/19/2017 FINDINGS: There has been reduction in the size of the large left pleural effusion. Moderate effusion persists. No pneumothorax. There is abnormal soft tissue density at the right lung base medially which could represent a mass. There is slight  focal pleural thickening laterally at the right lung base which is felt represent loculated fluid. Heart size and vascularity appear normal.  Power port in place. IMPRESSION: 1. No pneumothorax after left thoracentesis. 2. Moderate residual left effusion. 3. Abnormal soft tissue density at the right base medially which could represent a mass. This was not present on the PET-CT dated 06/19/2017. Electronically Signed   By: Lorriane Shire M.D.   On: 10/25/2017 13:16   Nm Pet Image Restag (ps) Skull Base To Thigh  Result Date: 10/25/2017 CLINICAL DATA:  Subsequent treatment strategy for stage IV mantle cell lymphoma. EXAM: NUCLEAR MEDICINE PET SKULL BASE TO THIGH TECHNIQUE: 10.2 mCi F-18 FDG was injected intravenously. Full-ring PET imaging was performed from the skull base to thigh after the radiotracer. CT data was obtained and used for attenuation correction and anatomic localization. FASTING BLOOD GLUCOSE:  Value: 103 mg/dl COMPARISON:  06/19/2017 FINDINGS: NECK: No hypermetabolic lymph nodes in the neck. CHEST: Marked interval progression of disease in the chest. There is bulky right axillary and subpectoral lymphadenopathy. Hypermetabolic lymphadenopathy identified in the left thoracic inlet, mediastinum, and hilar regions. Interval development of bulky pleural disease bilaterally. Index right axillary lymph node (image 55 series 4) measures 2.2 cm with SUV max = 12.8. Anterior left pleural mass versus markedly enlarged internal mammary lymph node measuring 3.7 cm (image 63 series 4) demonstrates SUV max = 15.2. 6.0 x 3.2 cm mass lesion in the medial right lung base, adjacent to the heart is probably the same lesion measured on the abdomen and pelvis CT of 02/28/2017 at 2.8 x 1.9 cm. This is markedly hypermetabolic with SUV max = 67.1. Hypermetabolic left anterior pleural lesion extends into the chest wall on image 84 series 4. Tiny right and moderate left pleural effusions are evident. Right Port-A-Cath tip  is in the distal SVC. ABDOMEN/PELVIS: There is omental and/or colonic wall nodular hypermetabolism. 1.4 x 2.0 cm nodule contiguous with the wall of the hepatic flexure (image 139 series 4) is hypermetabolic with SUV max = 24.5. Similar nodule along the distal transverse colon (image 144 series 4) is hypermetabolic. Hypermetabolic lymph node posterior to the stomach is identified on image 141. A large 3.2 x 2.7 cm soft tissue lesion anterior to the right psoas muscle on image 148 of series 4 demonstrates SUV max = 18.5. Other scattered hypermetabolic lymphadenopathy is seen in the retroperitoneum and pelvic sidewall. Marked interval progression of the distal sigmoid/rectal mass with appearance now similar back to the study from 02/28/2017. Atherosclerotic calcification noted in the abdominal aorta. No evidence for bowel obstruction. Large bilateral groin hernias contain small bowel loops without complicating features. SKELETON: Hypermetabolic nodules are seen in the subcutaneous fat of the anterior abdominal wall. There is a hypermetabolic lesion in the upper left rectus musculature (image 103 series 4). Other scattered hypermetabolic nodules are seen in the musculoskeletal anatomy. A 19 mm nodule is seen in the quadriceps musculature of the proximal left thigh. IMPRESSION: 1. Marked interval progression of hypermetabolic disease in the chest, abdomen, and pelvis. There is hypermetabolic lymphadenopathy in the chest with very marked bilateral pleural disease including a left anterior pleural-based lesion that invades the chest wall. Hypermetabolic lymphadenopathy is seen in the abdomen/pelvis with multiple hypermetabolic lesions along the surface of the colon and the distal sigmoid/rectal lesion has progressed substantially in the interval and is now similar in appearance to the previous CT scan of 02/28/2017. Diffuse subcutaneous and musculoskeletal soft tissue hypermetabolic lesions are associated. 2. Tiny right  and moderate left pleural effusions. 3.  Aortic Atherosclerois (ICD10-170.0) 4. Large bilateral groin hernias contain small bowel without complicating features. Electronically Signed   By: Misty Stanley M.D.   On: 10/25/2017 16:04   Dg Chest Port 1 View  Result Date: 10/25/2017 CLINICAL DATA:  Shortness of breath.  History of lymphoma EXAM: PORTABLE CHEST 1 VIEW COMPARISON:  PET-CT June 19, 2017. FINDINGS: Port-A-Cath tip is in superior vena cava. Humor needle is placed in the port. No pneumothorax. There is a large left pleural effusion with consolidation  left mid lower lung zones. The right lung is clear. Heart appears mildly enlarged. Visualized pulmonary vascularity is normal. No adenopathy evident in regions that can be assessed. No bone lesions. IMPRESSION: Sizable pleural effusion with left mid and lower lung zone consolidation. Right lung clear. Heart appears prominent. Port-A-Cath tip in superior vena cava. No pneumothorax. Electronically Signed   By: Lowella Grip III M.D.   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 Request is made for diagnostic and therapeutic thoracentesis. EXAM: ULTRASOUND GUIDED DIAGNOSTIC AND THERAPEUTIC THORACENTESIS MEDICATIONS: 1% lidocaine COMPLICATIONS: None immediate. PROCEDURE: An ultrasound guided thoracentesis was thoroughly discussed with the patient and questions answered. The benefits, risks, alternatives and complications were also discussed. The patient understands and wishes to proceed with the procedure. Written consent was obtained. Ultrasound was performed to localize and mark an adequate pocket of fluid in the left chest. The area was then prepped and draped in the normal sterile fashion. 1% Lidocaine was used for local anesthesia. Under ultrasound guidance a Safe-T-Centesis catheter was introduced. Thoracentesis was performed.  The catheter was removed and a dressing applied. FINDINGS: A total of approximately 2 L of amber fluid was removed. Samples were sent to the laboratory as requested by the clinical team. IMPRESSION: Successful ultrasound guided left thoracentesis yielding 2 L of pleural fluid. The procedure was terminated at this time secondary to his first time maximum of 2 L. Read by: Saverio Danker, PA-C Electronically Signed   By: Markus Daft M.D.   On: 10/25/2017 13:17    Scheduled Meds: . enoxaparin (LOVENOX) injection  40 mg Subcutaneous Q24H  . predniSONE  60 mg Oral Q breakfast   Continuous Infusions: . vancomycin Stopped (10/27/17 0726)     LOS: 2 days   Time spent: 25 minutes    Faye Ramsay, MD [facility] Pager [phone]  If 7PM-7AM, please contact night-coverage [email or web address] Password [facility] 10/27/2017, 8:44 AM

## 2017-10-28 ENCOUNTER — Telehealth: Payer: Self-pay | Admitting: Pharmacist

## 2017-10-28 ENCOUNTER — Telehealth: Payer: Self-pay | Admitting: Pharmacy Technician

## 2017-10-28 ENCOUNTER — Other Ambulatory Visit: Payer: Medicare Other

## 2017-10-28 ENCOUNTER — Ambulatory Visit: Payer: Medicare Other | Admitting: Hematology

## 2017-10-28 DIAGNOSIS — E46 Unspecified protein-calorie malnutrition: Secondary | ICD-10-CM

## 2017-10-28 DIAGNOSIS — C8318 Mantle cell lymphoma, lymph nodes of multiple sites: Secondary | ICD-10-CM

## 2017-10-28 DIAGNOSIS — R5383 Other fatigue: Secondary | ICD-10-CM

## 2017-10-28 DIAGNOSIS — J90 Pleural effusion, not elsewhere classified: Secondary | ICD-10-CM

## 2017-10-28 LAB — BASIC METABOLIC PANEL
Anion gap: 7 (ref 5–15)
BUN: 13 mg/dL (ref 6–20)
CHLORIDE: 107 mmol/L (ref 101–111)
CO2: 25 mmol/L (ref 22–32)
Calcium: 9.1 mg/dL (ref 8.9–10.3)
Creatinine, Ser: 0.68 mg/dL (ref 0.61–1.24)
GFR calc Af Amer: >60 mL/min (ref >60)
GFR calc non Af Amer: >60 mL/min (ref >60)
GLUCOSE: 204 mg/dL — AB (ref 65–99)
POTASSIUM: 4.2 mmol/L (ref 3.5–5.1)
Sodium: 139 mmol/L (ref 135–145)

## 2017-10-28 LAB — CULTURE, BODY FLUID W GRAM STAIN -BOTTLE

## 2017-10-28 LAB — CBC
HCT: 36.1 % — ABNORMAL LOW (ref 39.0–52.0)
HEMOGLOBIN: 11.4 g/dL — AB (ref 13.0–17.0)
MCH: 29.1 pg (ref 26.0–34.0)
MCHC: 31.6 g/dL (ref 30.0–36.0)
MCV: 92.1 fL (ref 78.0–100.0)
Platelets: 290 10*3/uL (ref 150–400)
RBC: 3.92 MIL/uL — AB (ref 4.22–5.81)
RDW: 14 % (ref 11.5–15.5)
WBC: 16.7 10*3/uL — ABNORMAL HIGH (ref 4.0–10.5)

## 2017-10-28 LAB — CULTURE, BODY FLUID-BOTTLE

## 2017-10-28 MED ORDER — ADULT MULTIVITAMIN W/MINERALS CH
1.0000 | ORAL_TABLET | Freq: Every day | ORAL | Status: DC
  Administered 2017-10-28 – 2017-11-01 (×5): 1 via ORAL
  Filled 2017-10-28 (×5): qty 1

## 2017-10-28 MED ORDER — IBRUTINIB 560 MG PO TABS: 560.0000 mg | ORAL_TABLET | Freq: Every day | ORAL | 1 refills | Status: DC

## 2017-10-28 MED ORDER — UNJURY CHICKEN SOUP POWDER
8.0000 [oz_av] | Freq: Two times a day (BID) | ORAL | Status: DC
  Administered 2017-10-28: 8 [oz_av] via ORAL
  Filled 2017-10-28 (×9): qty 27

## 2017-10-28 MED FILL — IMBRUVICA 560 MG TAB: 560 | 28 days supply | Qty: 28 | Fill #0

## 2017-10-28 NOTE — Telephone Encounter (Signed)
Oral Chemotherapy Pharmacist Encounter   I spoke with patient in hospital room for overview of: Imbruvica (ibrutinib).   Counseled patient on administration, dosing, side effects, monitoring, drug-food interactions, safe handling, storage, and disposal.  Patient will take Imbruvica 560mg  tablets, 1 tablet (560mg ) by mouth once daily. Patient will take Imbruvica at approximately the same time each day with a full glass of water and maintain adequate hydration throughout the day. Patient knows to avoid grapefruit or grapefruit juice while on therapy with Imbruvica. Imbruvica start date: 10/29/17  Side effects include but not limited to: bruising, decreased blood counts, N/V, diarrhea, musculoskeletal pain, arthralgias, peripheral edema, and hemorrhage.    Reviewed with patient importance of keeping a medication schedule and plan for any missed doses.  Kristopher Johnson voiced understanding and appreciation.   All questions answered. Medication reconciliation performed and medication/allergy list updated.  Patient will have family member pick up Imbruvica from the [facility] today (10/28/17) for copayment $8.35 for 28-day supply.   Patient plans to start Imbruvica tomorrow morning in the hospital. Dr. Irene Limbo will need to place order for Imbruvica to be able to be given in the hospital. Patient will need to use home supply for dosing while inpatient. The Imbruvica will be packaged by the inpatient pharmacy and dispensed to patient while admitted. Any remaining supply will be sent with patient upon hospital discharge.  Patient knows to call the office with questions or concerns. Oral Oncology Clinic will continue to follow.  Thank you,  Johny Drilling, PharmD, BCPS, BCOP 10/28/2017   12:36 PM Oral Oncology Clinic [phone]

## 2017-10-28 NOTE — Progress Notes (Signed)
Initial Nutrition Assessment  DOCUMENTATION CODES:   Severe malnutrition in context of chronic illness  INTERVENTION:  Unjury BID, each supplement provides 100kcal and 21g protein.    NUTRITION DIAGNOSIS:   Severe Malnutrition related to cancer and cancer related treatments as evidenced by energy intake < or equal to 75% for > or equal to 1 month, percent weight loss, severe muscle depletion.  GOAL:   Patient will meet greater than or equal to 90% of their needs  MONITOR:   PO intake, Supplement acceptance, Weight trends, I & O's  REASON FOR ASSESSMENT:   Consult Assessment of nutrition requirement/status  ASSESSMENT:  62 y.o. male, with stage IV mantle cell lymphoma (Dx 10/2015), pleural effusion, admitted with shortness of breath, s/p left thoracentesis, constipation.   Pt reports having a poor appetite with feelings of abdominal distention with meals. Pt reports he consumed ~50% of breakfast which consisted of an egg and sausage omelet. He consumed 100% of lunch yesterday, which was confirmed by recorded meal intake in the medical record. The pt reports consuming a limited about of dinner due to taste and texture preference/dislike.   Pt reports changes in smell and taste in the past month, along with overall decreased appetite. He denies feelings of nausea/vomiting with food intake. He also denies difficulty in chewing or swallowing.   Pt reports difficulty in moving his bowels for the past month. He reports feeling the urge but cannot empty his bowels. Pt reports when he does have a bowel movement it is of a mucous consistency.   PTA pt reports consuming carnation instant breakfast as his entire breakfast. PTA pt eats one small snack and dinner. Pt reports typically eating about 50% of dinner and excludes meat due to GI discomfort. Pt states he tolerates vegetables and starches the best.   Pt expressed interest in trying Unjury (chicken flavor) supplements instead of Ensure  Enlive or Boost.    Pt reports UBW of 203 (09/17/17) which is consistent with weight records. Pt has had 11% weightt loss in the past month, which is significant for time frame.  Labs: Glucose 204mg /dL.   Medications: MVI with minerals, senokot, vancomycin (stopped).   NUTRITION - FOCUSED PHYSICAL EXAM:    Most Recent Value  Orbital Region  No depletion  Upper Arm Region  Mild depletion  Thoracic and Lumbar Region  No depletion  Buccal Region  No depletion  Temple Region  Severe depletion  Clavicle Bone Region  No depletion  Clavicle and Acromion Bone Region  No depletion  Scapular Bone Region  No depletion  Dorsal Hand  No depletion  Patellar Region  No depletion  Anterior Thigh Region  No depletion  Posterior Calf Region  No depletion  Edema (RD Assessment)  None       Diet Order:  Diet regular Room service appropriate? Yes; Fluid consistency: Thin  EDUCATION NEEDS:   Education needs have been addressed  Pt reports having a dry mouth. RD provided mouth care education; biotene.   Skin:  Skin Assessment: Reviewed RN Assessment  Last BM:  1/19  Height:   Ht Readings from Last 1 Encounters:  10/25/17 5\' 10"  (1.778 m)    Weight:   Wt Readings from Last 1 Encounters:  10/25/17 182 lb 12.2 oz (82.9 kg)  09/17/17 204lb 3.2oz  (92.6kg)  Ideal Body Weight:  75.5 kg  BMI:  Body mass index is 26.22 kg/m.  Estimated Nutritional Needs:   Kcal:  2400-2600  Protein:  110-120g  Fluid:  2L  Lamerle Jabs, [place], Dietetic Intern Pager # [phone]

## 2017-10-28 NOTE — Telephone Encounter (Signed)
Oral Oncology Patient Advocate Encounter  Received notification from [place] that prior authorization for Imbruvica is required.  PA submitted on CoverMyMeds Key [id number] Status is pending  Oral Oncology Clinic will continue to follow.  [name]. [name], [place] Patient [place] [phone] 10/28/2017 8:49 AM

## 2017-10-28 NOTE — Progress Notes (Signed)
Patient ID: Kristopher Johnson, male   DOB: 1956-07-27, 62 y.o.   MRN: [id number]    PROGRESS NOTE  EMILLIANO Johnson  VZD:[id number] DOB: October 05, 1956, 62 y.o. DOA: 10/25/2017  PCP: Patient, No Pcp Per   Brief Narrative:  62 y.o. male, with stage IV mantle cell lymphoma diagnosed in 10/2015, status post R-CHOP 6 cycles followed by maintenance Rituxan, awaiting PET scan, retinitis pigmentosa with legal blindness was sent to the ED from PET scan after pt had increasing shortness of breath.  Course in the ED neck line patient was tachycardic low 100s, tachypneic, O2 sat dropped to 80% on room air, afebrile. Chest x-ray showed large left-sided pleural effusion with possibleconsolidation. 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 Irene Limbo   Active Problems:   Shortness of breath - secondary to the above - repeat CXR with progressively bulky bilateral pleural tumor with moderate left and small right pleural effusions - will discuss with Dr. Norina Buzzard cell lymphoma of lymph nodes of multiple regions [facility]) - follows with Dr. Irene Limbo, appreciate his assistance  - management per Dr. 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 Irene Limbo, also recommended stool softeners, pt says he does not know if it helps      Protein-calorie malnutrition ([place]) - in the setting of malignancy  - tolerating diet so far   DVT prophylaxis: Lovenox  Code Status: Full  Family Communication: pt at bedside  Disposition Plan: to be determined   Consultants:   IR  Procedures:   Left thoracentesis   Antimicrobials:   Vancomycin 10/26/2017 -->  Subjective: Pt reports rectal pain is much improved after dexamethasone, still with dyspnea.   Objective: Vitals:   10/27/17 0550 10/27/17 1400 10/27/17 2200 10/28/17 0500  BP: 113/60 128/67 116/74 123/77  Pulse: 85 98 97 77  Resp: 16 16 16  (!) 22  Temp: 98.1 F (36.7 C) 98.2 F (36.8 C) 98 F (36.7 C) 97.8 F (36.6 C)  TempSrc: Oral Oral Oral Oral  SpO2: 92% 93% 93% 91%  Weight:      Height:        Intake/Output Summary (Last 24 hours) at 10/28/2017 1242 Last data filed at 10/28/2017 0534 Gross per 24 hour  Intake 640 ml  Output -  Net 640 ml   Filed Weights   10/25/17 0838 10/25/17 1631  Weight: 92.5 kg (204 lb) 82.9 kg (182 lb 12.2 oz)    Physical Exam  Constitutional: Appears calm, NAD CVS: RRR, S1/S2 +, no murmurs, no gallops, no carotid bruit.  Pulmonary: Effort and breath sounds normal, diminished breath sounds at bases  Abdominal: Soft. BS +,  no distension, tenderness, rebound or guarding.  Neuro: Alert. Normal reflexes, muscle tone coordination. No cranial nerve  deficit.  Data Reviewed: I have personally reviewed following labs and imaging studies  CBC: Recent Labs  Lab 10/25/17 0844 10/25/17 0918 10/26/17 0441 10/27/17 0404 10/28/17 0500  WBC 17.4*  --  14.3* 16.1* 16.7*  NEUTROABS 15.2*  --   --   --   --   HGB 13.2 15.6 12.2* 11.7* 11.4*  HCT 40.8 46.0 38.1* 37.1* 36.1*  MCV 91.9  --  92.7 91.6 92.1  PLT 352  --  305 290 476   Basic Metabolic Panel: Recent Labs  Lab 10/25/17 0844 10/25/17 0918 10/26/17 0441 10/27/17 0404 10/28/17 0500  NA 139 139 138 138 139  K 4.1 3.6 3.5 3.5 4.2  CL 100* 99* 102 105 107    CO2 26  --  27 26 25   GLUCOSE 155* 154* 160* 182* 204*  BUN 13 12 12 11 13   CREATININE 1.12 0.80 0.92 0.79 0.68  CALCIUM 9.1  --  8.7* 8.8* 9.1  PHOS  --   --  2.8  --   --    Liver Function Tests: Recent Labs  Lab 10/25/17 0844  AST 27  ALT 18  ALKPHOS 79  BILITOT 1.4*  PROT 6.9  ALBUMIN 3.1*   Coagulation Profile: Recent Labs  Lab 10/25/17 0844  INR 1.02   CBG: Recent Labs  Lab 10/25/17 0834 10/25/17 1339  GLUCAP 164* 103*   Urine analysis:    Component Value Date/Time   COLORURINE YELLOW 10/24/2015 1705   APPEARANCEUR CLOUDY (A) 10/24/2015 1705   LABSPEC 1.024 10/24/2015 1705   PHURINE 5.0 10/24/2015 1705   GLUCOSEU NEGATIVE 10/24/2015 1705   HGBUR NEGATIVE 10/24/2015 1705   [place] 10/24/2015 [place] 10/24/2015 1705   PROTEINUR 30 (A) 10/24/2015 1705   NITRITE NEGATIVE 10/24/2015 1705   LEUKOCYTESUR NEGATIVE 10/24/2015 1705   Recent Results (from the past 240 hour(s))  Culture, body fluid-bottle     Status: Abnormal   Collection Time: 10/25/17 12:33 PM  Result Value Ref Range Status   Specimen Description FLUID PLEURAL  Final   Special Requests NONE  Final   Gram Stain   Final    GRAM POSITIVE COCCI ANAEROBIC BOTTLE ONLY CRITICAL RESULT CALLED TO, READ BACK BY AND VERIFIED WITH: Bettina Gavia RN, AT [phone] 10/26/17 BY Rush Landmark Performed at [facility], [place] [address], [place], [place] [address]    Culture ENTEROCOCCUS FAECALIS (A)  Final   Report Status 10/28/2017 FINAL  Final   Organism ID, Bacteria ENTEROCOCCUS FAECALIS  Final      Susceptibility   Enterococcus faecalis - MIC*    AMPICILLIN <=2 SENSITIVE Sensitive     VANCOMYCIN 1 SENSITIVE Sensitive     GENTAMICIN SYNERGY SENSITIVE Sensitive     * ENTEROCOCCUS FAECALIS  Gram stain     Status: None   Collection Time: 10/25/17 12:33 PM  Result Value Ref Range Status   Specimen Description FLUID  Final   Special Requests NONE  Final   Gram Stain   Final     ABUNDANT WBC PRESENT,BOTH PMN AND MONONUCLEAR NO ORGANISMS SEEN Performed at [facility], [place] [address] [place], [place] [address]    Report Status 10/25/2017 FINAL  Final    Radiology Studies: Dg Chest 2 View Result Date: 10/27/2017 1. Bulky bilateral pleural tumor with moderate left and small right pleural effusions redemonstrated. See also report of the PET-CT 10/25/2017. 2. No new cardiopulmonary abnormality.   Scheduled Meds: .  dexamethasone  4 mg Oral Q12H  . enoxaparin (LOVENOX) injection  40 mg Subcutaneous Q24H  . senna-docusate  1 tablet Oral BID   Continuous Infusions: . vancomycin Stopped (10/28/17 0950)     LOS: 3 days   Time spent: 25 minutes   Faye Ramsay, MD [facility] Pager [phone]  If 7PM-7AM, please contact night-coverage [email or web address] Password [facility] 10/28/2017, 12:42 PM

## 2017-10-28 NOTE — Telephone Encounter (Signed)
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  Labs from Epic assessed, [place] for treatment. Cmet on 10/25/17 shows new Tbili elevation to 1.4 (previosuly 0.35), recommend repeat, manufacturer dose recommend ibrutinib dose reduction in liver dysfunction. LFTs noted to be normal, INR also normal, albumin slightly low (Child-Pugh Class A).  Current medication list in Epic reviewed, no DDIs with Imbruvica identified on home medication list. Risk of bleeding increased with anti-coagulation, patient on enoxaparin for VTE prophylaxis while admitted, will not be continued at discharge.  Prescription has been e-scribed to the [facility] for benefits analysis and approval. Prior authorization is required, is being submitted as expedited. No samples for Imbruvica are available, no manufacturer voucher is offeree for quick-start.  Oral Oncology Clinic will continue to follow for insurance authorization, copayment issues, initial counseling and start date.  Johny Drilling, PharmD, BCPS, BCOP 10/28/2017 8:33 AM Oral Oncology Clinic [phone]

## 2017-10-29 ENCOUNTER — Inpatient Hospital Stay (HOSPITAL_COMMUNITY): Payer: Medicare Other

## 2017-10-29 DIAGNOSIS — C8310 Mantle cell lymphoma, unspecified site: Secondary | ICD-10-CM

## 2017-10-29 DIAGNOSIS — E43 Unspecified severe protein-calorie malnutrition: Secondary | ICD-10-CM

## 2017-10-29 LAB — BODY FLUID CELL COUNT WITH DIFFERENTIAL
LYMPHS FL: 76 %
Monocyte-Macrophage-Serous Fluid: 19 % — ABNORMAL LOW (ref 50–90)
NEUTROPHIL FLUID: 5 % (ref 0–25)
WBC FLUID: 4722 uL — AB (ref 0–1000)

## 2017-10-29 LAB — URIC ACID: URIC ACID, SERUM: 4.2 mg/dL — AB (ref 4.4–7.6)

## 2017-10-29 LAB — PROTEIN, PLEURAL OR PERITONEAL FLUID

## 2017-10-29 LAB — ALBUMIN, PLEURAL OR PERITONEAL FLUID: Albumin, Fluid: 1.6 g/dL

## 2017-10-29 LAB — GLUCOSE, PLEURAL OR PERITONEAL FLUID: GLUCOSE FL: 169 mg/dL

## 2017-10-29 LAB — LACTATE DEHYDROGENASE, PLEURAL OR PERITONEAL FLUID: LD, Fluid: 541 U/L — ABNORMAL HIGH (ref 3–23)

## 2017-10-29 MED ORDER — IBRUTINIB 420 MG PO TABS
560.0000 mg | ORAL_TABLET | Freq: Every day | ORAL | Status: DC
  Administered 2017-10-29 – 2017-11-01 (×4): 560 mg via ORAL
  Filled 2017-10-29: qty 1

## 2017-10-29 MED ORDER — LIDOCAINE HCL 1 % IJ SOLN
INTRAMUSCULAR | Status: AC
  Filled 2017-10-29: qty 10

## 2017-10-29 MED ORDER — SODIUM CHLORIDE 0.9 % IV SOLN
2.0000 g | INTRAVENOUS | Status: DC
  Administered 2017-10-29 – 2017-11-01 (×17): 2 g via INTRAVENOUS
  Filled 2017-10-29 (×20): qty 2000

## 2017-10-29 MED ORDER — SODIUM CHLORIDE 0.9% FLUSH
10.0000 mL | Freq: Two times a day (BID) | INTRAVENOUS | Status: DC
  Administered 2017-10-29: 10 mL
  Administered 2017-11-01: 3 mL

## 2017-10-29 NOTE — Progress Notes (Signed)
Patient's home dose of Imbruvica stored in pharmacy.

## 2017-10-29 NOTE — Progress Notes (Signed)
Pharmacy Antibiotic Note  Kristopher Johnson is a 62 y.o. male with stage IV mantel cell lymphoma and retinitis pigmentosis presented to the ED on 10/25/2017 with SOB.  He was found to have large left-sided pleural effusion and had thoracentesis on 1/18.  Vancomycin was started on 1/19 for GPC (identified as enterococcus faecalis) noted in pleural fluid cx.    Today, 10/29/2017: - day #4 abx - afeb, wbc elevated (on decadron) - scr 0.68 (crcl~97)  Plan: - vancomycin 1000 mg IV q12h - consider switching to ampicillin since this has more reliable in vitro data and is typically first line abx recommendation for enterococcus infection.  ___________________________________________  Height: 5\' 10"  (177.8 cm) Weight: 182 lb 12.2 oz (82.9 kg) IBW/kg (Calculated) : 73  Temp (24hrs), Avg:97.6 F (36.4 C), Min:97.5 F (36.4 C), Max:97.7 F (36.5 C)  Recent Labs  Lab 10/25/17 0844 10/25/17 0918 10/26/17 0441 10/27/17 0404 10/28/17 0500  WBC 17.4*  --  14.3* 16.1* 16.7*  CREATININE 1.12 0.80 0.92 0.79 0.68    Estimated Creatinine Clearance: 97.6 mL/min (by C-G formula based on SCr of 0.68 mg/dL).    No Known Allergies  Antimicrobials this admission: 1/19 vanc >>  Dose adjustments this admission: --   Microbiology results: 1/18 pleural fluid: enterococcus faecalis (S= amp, vanc) 1/18 pleural fluid fungus stain:  Thank you for allowing pharmacy to be a part of this patient's care.  Lynelle Doctor 10/29/2017 8:14 AM

## 2017-10-29 NOTE — Care Management Important Message (Signed)
Important Message  Patient Details  Name: BRENTT FREAD MRN: [id number] Date of Birth: Feb 15, 1956   Medicare Important Message Given:  Yes    Kerin Salen 10/29/2017, 11:20 AMImportant Message  Patient Details  Name: MILAN PERKINS MRN: [id number] Date of Birth: 1956-10-06   Medicare Important Message Given:  Yes    Kerin Salen 10/29/2017, 11:20 AM

## 2017-10-29 NOTE — Progress Notes (Signed)
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 Patient chooses not to pursue AutoHSCT based strategies which is quite reasonable considering his functional challenges with legal blindness and limited social support.   S/p 6 cycles of Bendamustine/Rituxan    INTERVAL HISTORY  Kristopher Johnson was seen 10/28/2017 in the evening.  Notes that he is now off the prednisone due to findings of enteral faecalis growing out of his pleural fluid.  He is currently on antibiotics to cover this.  We discussed that his initial counts and if PET scan shows overt involvement of his pleura bilaterally. Our wonderful chemotherapy pharmacist was able to get expedited approval for Ibrutinib.  We have discussed this medication in detail and he has received his medication today and will start taking it from tomorrow morning.  He is agreeable to repeat thoracentesis if needed for symptom control.  PAST MEDICAL HISTORY:  Past Medical History:  Diagnosis Date  . Abscess of arm, right 10/27/2015  . Lymphoma of lymph nodes of multiple sites ([place]) 10/27/2015  . 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 Shortness of breath 10/27/2015    SURGICAL HISTORY: Past Surgical History:  Procedure Laterality Date  . Cataract surgery     Patient notes she has had bilateral Surgery in 1998 and 99  . FLEXIBLE SIGMOIDOSCOPY N/A 03/01/2017   Procedure: FLEXIBLE SIGMOIDOSCOPY;  Surgeon: Manus Gunning, MD;  Location:  [facility];  Service: Gastroenterology;  Laterality: N/A;  . TONSILLECTOMY     about 62years old    SOCIAL HISTORY: Social History   Socioeconomic History  . Marital status: Widowed    Spouse name: Not on file  . Number of children: Not on file  . Years of education: Not on file  . Highest education level: Not on file  Social Needs  . Financial resource strain: Not on file  . Food insecurity - worry: Not on file  . Food insecurity - inability: Not on file  . Transportation needs - medical: Not on file  . Transportation needs - non-medical: Not on file  Occupational History  . Not on file  Tobacco Use  . Smoking status: Current Every Day Smoker    Packs/day: 1.00    Years: 33.00    Pack years: 33.00  . Smokeless tobacco: Never Used  Substance and Sexual Activity  . Alcohol use: No  . Drug use: No  . Sexual activity: Not on file    Comment: Disabled, retinis pigmentosa legally blind, 4children MPOA Kristopher Johnson(cousin)  Other Topics Concern  . Not on file  Social History Narrative  . Not on file    FAMILY HISTORY: Family History  Problem Relation Age of Onset  . Colon cancer Neg Hx     ALLERGIES:  has No Known Allergies.  MEDICATIONS:  Current Facility-Administered Medications  Medication Dose Route Frequency Provider Last Rate Last Dose  . acetaminophen (TYLENOL) tablet 650 mg  650 mg Oral Q6H PRN Dhungel, Nishant, MD       Or  . acetaminophen (  TYLENOL) suppository 650 mg  650 mg Rectal Q6H PRN Dhungel, Nishant, MD      . ampicillin (OMNIPEN) 2 g in sodium chloride 0.9 % 50 mL IVPB  2 g Intravenous Q4H Theodis Blaze, MD      . dexamethasone (DECADRON) tablet 4 mg  4 mg Oral Q12H Theodis Blaze, MD   4 mg at 10/29/17 0919  . enoxaparin (LOVENOX) injection 40 mg  40 mg Subcutaneous Q24H Dhungel, Nishant, MD      . multivitamin with minerals tablet 1 tablet  1 tablet Oral Daily Theodis Blaze, MD   1 tablet at 10/29/17 0919  . ondansetron (ZOFRAN) tablet 4 mg  4 mg  Oral Q6H PRN Dhungel, Nishant, MD       Or  . ondansetron (ZOFRAN) injection 4 mg  4 mg Intravenous Q6H PRN Dhungel, Nishant, MD      . oxyCODONE (Oxy IR/ROXICODONE) immediate release tablet 5 mg  5 mg Oral Q4H PRN Theodis Blaze, MD      . protein supplement [facility] CHICKEN SOUP) powder 8 oz  8 oz Oral BID BM Theodis Blaze, MD   8 oz at 10/29/17 0919  . senna-docusate (Senokot-S) tablet 1 tablet  1 tablet Oral BID Theodis Blaze, MD   1 tablet at 10/29/17 0919    REVIEW OF SYSTEMS:    A 10+ POINT REVIEW OF SYSTEMS WAS OBTAINED including neurology, dermatology, psychiatry, cardiac, respiratory, lymph, extremities, GI, GU, Musculoskeletal, constitutional, breasts, reproductive, HEENT.  All pertinent positives are noted in the HPI.  All others are negative.  PHYSICAL EXAMINATION:  ECOG PERFORMANCE STATUS: 2 - Symptomatic, <50% confined to bed .VS as noted in [place], in no acute distress and comfortable SKIN: some nodular skin lesions over extremities -much improved OROPHARYNX:no exudate, no erythema and lips, multiple carious teeth with significant periodontal disease. NECK: supple, no JVD, thyroid normal size, non-tender, without nodularity LYMPH:  no palpable lymphadenopathy in the cervical, axillary or inguinal LUNGS: clear to auscultation with normal respiratory effort HEART: regular rate & rhythm,  no murmurs and no lower extremity edema ABDOMEN: abdomen soft, non-tender, normoactive bowel sounds  Musculoskeletal: no cyanosis of digits and no clubbing  PSYCH: alert & oriented x 3 with fluent speech NEURO: no focal motor/sensory deficits  LABORATORY DATA:  I have reviewed the data as listed   CBC Latest Ref Rng & Units 10/28/2017 10/27/2017 10/26/2017  WBC 4.0 - 10.5 K/uL 16.7(H) 16.1(H) 14.3(H)  Hemoglobin 13.0 - 17.0 g/dL 11.4(L) 11.7(L) 12.2(L)  Hematocrit 39.0 - 52.0 % 36.1(L) 37.1(L) 38.1(L)  Platelets 150 - 400 K/uL 290 290 305    . CMP Latest Ref Rng & Units  10/28/2017 10/27/2017 10/26/2017  Glucose 65 - 99 mg/dL 204(H) 182(H) 160(H)  BUN 6 - 20 mg/dL 13 11 12   Creatinine 0.61 - 1.24 mg/dL 0.68 0.79 0.92  Sodium 135 - 145 mmol/L 139 138 138  Potassium 3.5 - 5.1 mmol/L 4.2 3.5 3.5  Chloride 101 - 111 mmol/L 107 105 102  CO2 22 - 32 mmol/L 25 26 27   Calcium 8.9 - 10.3 mg/dL 9.1 8.8(L) 8.7(L)  Total Protein 6.5 - 8.1 g/dL - - -  Total Bilirubin 0.3 - 1.2 mg/dL - - -  Alkaline Phos 38 - 126 U/L - - -  AST 15 - 41 U/L - - -  ALT 17 - 63 U/L - - -     RADIOGRAPHIC STUDIES: I have personally reviewed the radiological images  as listed and agreed with the findings in the report. Dg Chest 1 View  Result Date: 10/25/2017 CLINICAL DATA:  Left pleural effusion.  Status post thoracentesis. EXAM: CHEST 1 VIEW 1 p.m. COMPARISON:  10/25/2017 8:51 a.m. and PET-CT dated 06/19/2017 FINDINGS: There has been reduction in the size of the large left pleural effusion. Moderate effusion persists. No pneumothorax. There is abnormal soft tissue density at the right lung base medially which could represent a mass. There is slight focal pleural thickening laterally at the right lung base which is felt represent loculated fluid. Heart size and vascularity appear normal.  Power port in place. IMPRESSION: 1. No pneumothorax after left thoracentesis. 2. Moderate residual left effusion. 3. Abnormal soft tissue density at the right base medially which could represent a mass. This was not present on the PET-CT dated 06/19/2017. Electronically Signed   By: Lorriane Shire M.D.   On: 10/25/2017 13:16   Dg Chest 2 View  Result Date: 10/27/2017 CLINICAL DATA:  62 year old male with shortness of breath and cough. Stage IV mantle cell lymphoma. EXAM: CHEST  2 VIEW COMPARISON:  PET-CT 10/25/2017, and earlier. FINDINGS: Right chest porta cath, accessed. Moderate-sized left and small right pleural effusions persist and are associated with bulky pleural nodularity (lateral view) corresponding to  the recent hypermetabolic pleural disease on PET. The mediastinal contour is more normal. Visualized tracheal air column is within normal limits. No cardiomegaly. No pneumothorax or pulmonary edema. No acute osseous abnormality identified radiographically. Paucity of bowel gas in the upper abdomen. IMPRESSION: 1. Bulky bilateral pleural tumor with moderate left and small right pleural effusions redemonstrated. See also report of the PET-CT 10/25/2017. 2. No new cardiopulmonary abnormality. Electronically Signed   By: Genevie Ann M.D.   On: 10/27/2017 15:31   Nm Pet Image Restag (ps) Skull Base To Thigh  Result Date: 10/25/2017 CLINICAL DATA:  Subsequent treatment strategy for stage IV mantle cell lymphoma. EXAM: NUCLEAR MEDICINE PET SKULL BASE TO THIGH TECHNIQUE: 10.2 mCi F-18 FDG was injected intravenously. Full-ring PET imaging was performed from the skull base to thigh after the radiotracer. CT data was obtained and used for attenuation correction and anatomic localization. FASTING BLOOD GLUCOSE:  Value: 103 mg/dl COMPARISON:  06/19/2017 FINDINGS: NECK: No hypermetabolic lymph nodes in the neck. CHEST: Marked interval progression of disease in the chest. There is bulky right axillary and subpectoral lymphadenopathy. Hypermetabolic lymphadenopathy identified in the left thoracic inlet, mediastinum, and hilar regions. Interval development of bulky pleural disease bilaterally. Index right axillary lymph node (image 55 series 4) measures 2.2 cm with SUV max = 12.8. Anterior left pleural mass versus markedly enlarged internal mammary lymph node measuring 3.7 cm (image 63 series 4) demonstrates SUV max = 15.2. 6.0 x 3.2 cm mass lesion in the medial right lung base, adjacent to the heart is probably the same lesion measured on the abdomen and pelvis CT of 02/28/2017 at 2.8 x 1.9 cm. This is markedly hypermetabolic with SUV max = 10.9. Hypermetabolic left anterior pleural lesion extends into the chest wall on image 84  series 4. Tiny right and moderate left pleural effusions are evident. Right Port-A-Cath tip is in the distal SVC. ABDOMEN/PELVIS: There is omental and/or colonic wall nodular hypermetabolism. 1.4 x 2.0 cm nodule contiguous with the wall of the hepatic flexure (image 139 series 4) is hypermetabolic with SUV max = 32.3. Similar nodule along the distal transverse colon (image 144 series 4) is hypermetabolic. Hypermetabolic lymph node posterior to the stomach is identified  on image 141. A large 3.2 x 2.7 cm soft tissue lesion anterior to the right psoas muscle on image 148 of series 4 demonstrates SUV max = 18.5. Other scattered hypermetabolic lymphadenopathy is seen in the retroperitoneum and pelvic sidewall. Marked interval progression of the distal sigmoid/rectal mass with appearance now similar back to the study from 02/28/2017. Atherosclerotic calcification noted in the abdominal aorta. No evidence for bowel obstruction. Large bilateral groin hernias contain small bowel loops without complicating features. SKELETON: Hypermetabolic nodules are seen in the subcutaneous fat of the anterior abdominal wall. There is a hypermetabolic lesion in the upper left rectus musculature (image 103 series 4). Other scattered hypermetabolic nodules are seen in the musculoskeletal anatomy. A 19 mm nodule is seen in the quadriceps musculature of the proximal left thigh. IMPRESSION: 1. Marked interval progression of hypermetabolic disease in the chest, abdomen, and pelvis. There is hypermetabolic lymphadenopathy in the chest with very marked bilateral pleural disease including a left anterior pleural-based lesion that invades the chest wall. Hypermetabolic lymphadenopathy is seen in the abdomen/pelvis with multiple hypermetabolic lesions along the surface of the colon and the distal sigmoid/rectal lesion has progressed substantially in the interval and is now similar in appearance to the previous CT scan of 02/28/2017. Diffuse  subcutaneous and musculoskeletal soft tissue hypermetabolic lesions are associated. 2. Tiny right and moderate left pleural effusions. 3.  Aortic Atherosclerois (ICD10-170.0) 4. Large bilateral groin hernias contain small bowel without complicating features. Electronically Signed   By: Misty Stanley M.D.   On: 10/25/2017 16:04   Dg Chest Port 1 View  Result Date: 10/25/2017 CLINICAL DATA:  Shortness of breath.  History of lymphoma EXAM: PORTABLE CHEST 1 VIEW COMPARISON:  PET-CT June 19, 2017. FINDINGS: Port-A-Cath tip is in superior vena cava. Humor needle is placed in the port. No pneumothorax. There is a large left pleural effusion with consolidation left mid lower lung zones. The right lung is clear. Heart appears mildly enlarged. Visualized pulmonary vascularity is normal. No adenopathy evident in regions that can be assessed. No bone lesions. IMPRESSION: Sizable pleural effusion with left mid and lower lung zone consolidation. Right lung clear. Heart appears prominent. Port-A-Cath tip in superior vena cava. No pneumothorax. Electronically Signed   By: Lowella Grip III M.D.   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 Request is made for diagnostic and therapeutic thoracentesis. EXAM: ULTRASOUND GUIDED DIAGNOSTIC AND THERAPEUTIC THORACENTESIS MEDICATIONS: 1% lidocaine COMPLICATIONS: None immediate. PROCEDURE: An ultrasound guided thoracentesis was thoroughly discussed with the patient and questions answered. The benefits, risks, alternatives and complications were also discussed. The patient understands and wishes to proceed with the procedure. Written consent was obtained. Ultrasound was performed to localize and mark an adequate pocket of fluid in the left chest. The area was then prepped and draped in the normal sterile fashion. 1% Lidocaine was used for local anesthesia.  Under ultrasound guidance a Safe-T-Centesis catheter was introduced. Thoracentesis was performed. The catheter was removed and a dressing applied. FINDINGS: A total of approximately 2 L of amber fluid was removed. Samples were sent to the laboratory as requested by the clinical team. IMPRESSION: Successful ultrasound guided left thoracentesis yielding 2 L of pleural fluid. The procedure was terminated at this time secondary to his first time maximum of 2 L. Read by: Saverio Danker, PA-C Electronically Signed   By: Markus Daft M.D.   On: 10/25/2017 13:17  ECHO: 11/01/2015: Study Conclusions  - Left ventricle: The cavity size was normal. There was mild concentric hypertrophy. Systolic function was normal. The estimated ejection fraction was in the range of 50% to 55%. Wall motion was normal; there were no regional wall motion abnormalities. Doppler parameters are consistent with abnormal left ventricular relaxation (grade 1 diastolic dysfunction). - Aortic valve: Transvalvular velocity was within the normal range. There was no stenosis. There was no regurgitation. - Mitral valve: There was no regurgitation. - Right ventricle: The cavity size was normal. Wall thickness was normal. Systolic function was normal. - Tricuspid valve: There was no regurgitation. - Inferior vena cava: The vessel was normal in size. The respirophasic diameter changes were in the normal range (>= 50%), consistent with normal central venous pressure.Study Conclusions  - Left ventricle: The cavity size was normal. There was mild concentric hypertrophy. Systolic function was normal. The estimated ejection fraction was in the range of 50% to 55%. Wall motion was normal; there were no regional wall motion abnormalities. Doppler parameters are consistent with abnormal left ventricular relaxation (grade 1 diastolic dysfunction). - Aortic valve: Transvalvular velocity was within the normal  range. There was no stenosis. There was no regurgitation. - Mitral valve: There was no regurgitation. - Right ventricle: The cavity size was normal. Wall thickness was normal. Systolic function was normal. - Tricuspid valve: There was no regurgitation. - Inferior vena cava: The vessel was normal in size. The respirophasic diameter changes were in the normal range (>= 50%), consistent with normal central venous pressure.  ASSESSMENT & PLAN:    62 yo man with   1) h/o previous Stage IVB E Mantle Cell lymphoma with likely gastric involvement, extensive LNadenopathy. ECHO nl EF ECOG PS 2 but activities significantly limited due to being legally blind HIV/Hep C/Hep B neg PET/CT scan after 3 cycles of R CHOP show good overall response. Stomach lesion still with very FDG uptake ? Residual lymphoma versus inflammation.  Patient is status post 6 cycles of R CHOP. PET/CT after 6 cycles shows resolution of hypermetabolic lymphadenopathy and gastric wall activity . Patient was on maintenance Rituxan for 1 year then relapsed. 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 CT chest abdomen pelvis showed multiple areas of lymphadenopathy in the abdomen as well as in the chest suggesting significant recurrence. PET/CT scan 03/25/2017 shows diffuse involvement with mantle cell lymphoma involving the chest abdomen pelvis and bowels. PET/ct 06/19/2017-- showed significant partial response to BR treatment PET/CT 10/25/2017 - with significant mantle cell lymphoma progression again.  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 Lab Results  Component Value Date   LDH 331 (H) 10/26/2017    4) Rectal urgency due to mantle cell lymphoma causing mass in rectum/sigmoid 5) malignant pleural effusion and b/l pleural involvement. Also with ? Parapneumonia infection - culture  growing out Enterococcus faecalis. ? True infection vs contaminant. (Body fluid cell diff - 90% lymphocytic consistent with lymphoma as opposed to empyema at this time) 6) Shortness related to symptomatic pleural effusion. PLAN -I discussed all the labs and imaging findings with the patient in details and answered all his relevant questions. -I discussed in detail and received informed consent to start Ibrutinib from tomorrow AM at 560 mg p.o. Daily.  (appreciate help from pharmacis) -continue Antibiotics per hospitalist Vancomycin>>> Ampicillin -agree with holding prednisone -would rpt therapeutic thoracentesis for symptom management. -if rapidly recurrent pleural fluid /  evidence of empyema on rpt thoracentesis might need pleurex placement. -appreciate care from hospital medicine team. -monitor for TLS with Ibrutinib -watch for bleeding issues and cardiac arrhythmias  7) Retinitis pigmentosa causing legal blindness -  is seeing a retina specialist in town. No acute interventions at this time. Notes that his vision is progressively getting worse. His daughter and granddaughter are helping him out at home.   8) Weight loss and protein calorie malnutrition. Resolved. Weight has improved significantly since his diagnosis and back to his baseline weight.  Wt Readings from Last 3 Encounters:  10/25/17 182 lb 12.2 oz (82.9 kg)  09/17/17 204 lb 3.2 oz (92.6 kg)  08/06/17 203 lb 8 oz (92.3 kg)   4) h/o Peri-orbital dermatitis /Conjuncitivitis - patient was seen by the dermatologist and given topical desonide ointment which led to the resolution of his dermatitis. There were not sure about the etiology but noted then to could not rule out Rituxan is a possibility. Currently his dermatitis has resolved and his conjunctivitis has resolved. He did not have rash anywhere else at the time. No oral sores or other toxicities. This happened about 4 days after Rituxan another chemotherapy. This has completely  resolved and patient has not had an further issues with this with several additional doses of Rituxan.  5) Patient Active Problem List   Diagnosis Date Noted  . Protein-calorie malnutrition, severe 10/29/2017  . 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 Pleural effusion on left 10/25/2017  . Counseling regarding advanced care planning and goals of care 03/13/2017  . Rectal mass 03/02/2017  . Colon cancer metastasized to liver and lung([facility]) 03/02/2017  . Rectal bleeding 02/28/2017  . Portacath in place 01/30/2016  . Skin lesion of chest wall 12/19/2015  . Shingles 12/19/2015  . Rash 11/28/2015  . 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 Mantle cell lymphoma of lymph nodes of multiple regions ([place]) 11/01/2015  . Protein-calorie malnutrition ([place]) 11/01/2015  . Abscess of arm, right 10/27/2015  . Lymphoma of lymph nodes of multiple sites ([place]) 10/27/2015  . Shortness of breath 10/27/2015  . Shortness of breath dyspnea 10/27/2015  . Hypoalbuminemia 10/25/2015  . Elevated lipase 10/25/2015  . Inguinal hernia 10/25/2015  . Urinary retention 10/25/2015  . Abdominal pain   . Lymphadenopathy 10/24/2015   I spent 30 minutes counseling the patient face to face. The total time spent in the appointment was 35 minutes and more than 50% was on counseling and direct patient cares.  Sullivan Lone MD [place] AAHIVMS [facility] Hematology/Oncology [facility]  (Office):       [phone] (Work cell):  [phone] (Fax):           [phone]

## 2017-10-29 NOTE — Progress Notes (Signed)
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 Patient chooses not to pursue AutoHSCT based strategies which is quite reasonable considering his functional challenges with legal blindness and limited social support.   S/p 6 cycles of Bendamustine/Rituxan    INTERVAL HISTORY  Patient notes breathing much improved after rpt thoracentesis today. His Vanco was changed to Ampicillin to better cover enerococcus. Started taking Ibrutinib today - no acute adverse effects at this time.   PAST MEDICAL HISTORY:  Past Medical History:  Diagnosis Date  . Abscess of arm, right 10/27/2015  . Lymphoma of lymph nodes of multiple sites ([place]) 10/27/2015  . 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 Shortness of breath 10/27/2015    SURGICAL HISTORY: Past Surgical History:  Procedure Laterality Date  . Cataract surgery     Patient notes she has had bilateral Surgery in 1998 and 99  . FLEXIBLE SIGMOIDOSCOPY N/A 03/01/2017   Procedure: FLEXIBLE SIGMOIDOSCOPY;  Surgeon: Manus Gunning, MD;  Location: [facility];  Service: Gastroenterology;  Laterality: N/A;  . TONSILLECTOMY     about 62years old    SOCIAL HISTORY: Social History   Socioeconomic History  . Marital status: Widowed    Spouse name: Not on file  . Number of children: Not on file  . Years of education: Not on file  . Highest education level: Not on file  Social Needs  . Financial resource strain: Not on file  .  Food insecurity - worry: Not on file  . Food insecurity - inability: Not on file  . Transportation needs - medical: Not on file  . Transportation needs - non-medical: Not on file  Occupational History  . Not on file  Tobacco Use  . Smoking status: Current Every Day Smoker    Packs/day: 1.00    Years: 33.00    Pack years: 33.00  . Smokeless tobacco: Never Used  Substance and Sexual Activity  . Alcohol use: No  . Drug use: No  . Sexual activity: Not on file    Comment: Disabled, retinis pigmentosa legally blind, 4children MPOA Lynnn(cousin)  Other Topics Concern  . Not on file  Social History Narrative  . Not on file    FAMILY HISTORY: Family History  Problem Relation Age of Onset  . Colon cancer Neg Hx     ALLERGIES:  has No Known Allergies.  MEDICATIONS:  Current Facility-Administered Medications  Medication Dose Route Frequency Provider Last Rate Last Dose  . acetaminophen (TYLENOL) tablet 650 mg  650 mg Oral Q6H PRN Dhungel, Nishant, MD       Or  . acetaminophen (TYLENOL) suppository 650 mg  650 mg Rectal Q6H PRN Dhungel, Nishant, MD      . ampicillin (OMNIPEN) 2 g in sodium chloride 0.9 % 50 mL IVPB  2 g Intravenous Q4H Theodis Blaze, MD   Stopped at 10/29/17 2150  . dexamethasone (DECADRON) tablet 4 mg  4 mg Oral Q12H Theodis Blaze, MD   4 mg at 10/29/17 2129  .  enoxaparin (LOVENOX) injection 40 mg  40 mg Subcutaneous Q24H Dhungel, Nishant, MD      . Ibrutinib TABS 560 mg  560 mg Oral Q breakfast Theodis Blaze, MD   560 mg at 10/29/17 1108  . lidocaine (XYLOCAINE) 1 % (with pres) injection           . multivitamin with minerals tablet 1 tablet  1 tablet Oral Daily Theodis Blaze, MD   1 tablet at 10/29/17 0919  . ondansetron (ZOFRAN) tablet 4 mg  4 mg Oral Q6H PRN Dhungel, Nishant, MD       Or  . ondansetron (ZOFRAN) injection 4 mg  4 mg Intravenous Q6H PRN Dhungel, Nishant, MD      . oxyCODONE (Oxy IR/ROXICODONE) immediate release tablet 5 mg  5 mg Oral Q4H PRN  Theodis Blaze, MD      . protein supplement [facility] CHICKEN SOUP) powder 8 oz  8 oz Oral BID BM Theodis Blaze, MD   8 oz at 10/28/17 1619  . senna-docusate (Senokot-S) tablet 1 tablet  1 tablet Oral BID Theodis Blaze, MD   1 tablet at 10/29/17 2129  . sodium chloride flush (NS) 0.9 % injection 10-40 mL  10-40 mL Intracatheter Q12H Theodis Blaze, MD   10 mL at 10/29/17 2130    REVIEW OF SYSTEMS:    A 10+ POINT REVIEW OF SYSTEMS WAS OBTAINED including neurology, dermatology, psychiatry, cardiac, respiratory, lymph, extremities, GI, GU, Musculoskeletal, constitutional, breasts, reproductive, HEENT.  All pertinent positives are noted in the HPI.  All others are negative.  PHYSICAL EXAMINATION:  ECOG PERFORMANCE STATUS: 2 - Symptomatic, <50% confined to bed .VS as noted in [place], in no acute distress and comfortable SKIN: some nodular skin lesions over extremities -much improved OROPHARYNX:no exudate, no erythema and lips, multiple carious teeth with significant periodontal disease. NECK: supple, no JVD, thyroid normal size, non-tender, without nodularity LYMPH:  no palpable lymphadenopathy in the cervical, axillary or inguinal LUNGS: clear to auscultation with normal respiratory effort HEART: regular rate & rhythm,  no murmurs and no lower extremity edema ABDOMEN: abdomen soft, non-tender, normoactive bowel sounds  Musculoskeletal: no cyanosis of digits and no clubbing  PSYCH: alert & oriented x 3 with fluent speech NEURO: no focal motor/sensory deficits  LABORATORY DATA:  I have reviewed the data as listed   CBC Latest Ref Rng & Units 10/28/2017 10/27/2017 10/26/2017  WBC 4.0 - 10.5 K/uL 16.7(H) 16.1(H) 14.3(H)  Hemoglobin 13.0 - 17.0 g/dL 11.4(L) 11.7(L) 12.2(L)  Hematocrit 39.0 - 52.0 % 36.1(L) 37.1(L) 38.1(L)  Platelets 150 - 400 K/uL 290 290 305    . CMP Latest Ref Rng & Units 10/28/2017 10/27/2017 10/26/2017  Glucose 65 - 99 mg/dL 204(H) 182(H) 160(H)  BUN 6 - 20  mg/dL 13 11 12   Creatinine 0.61 - 1.24 mg/dL 0.68 0.79 0.92  Sodium 135 - 145 mmol/L 139 138 138  Potassium 3.5 - 5.1 mmol/L 4.2 3.5 3.5  Chloride 101 - 111 mmol/L 107 105 102  CO2 22 - 32 mmol/L 25 26 27   Calcium 8.9 - 10.3 mg/dL 9.1 8.8(L) 8.7(L)  Total Protein 6.5 - 8.1 g/dL - - -  Total Bilirubin 0.3 - 1.2 mg/dL - - -  Alkaline Phos 38 - 126 U/L - - -  AST 15 - 41 U/L - - -  ALT 17 - 63 U/L - - -     RADIOGRAPHIC STUDIES: I have personally reviewed the radiological images as  listed and agreed with the findings in the report. Dg Chest 1 View  Result Date: 10/29/2017 CLINICAL DATA:  Status post left thoracentesis. EXAM: CHEST 1 VIEW COMPARISON:  10/27/2017 FINDINGS: Right chest wall port a catheter noted with tip at the cavoatrial junction. There has been interval decrease in volume of the right pleural effusion from previous exam. The left pleural effusion is also decreased in the interval. No left-sided pneumothorax status post thoracentesis. Resolution of previous interstitial edema. IMPRESSION: 1. No pneumothorax following left-sided thoracentesis. Electronically Signed   By: Kerby Moors M.D.   On: 10/29/2017 16:22   Dg Chest 1 View  Result Date: 10/25/2017 CLINICAL DATA:  Left pleural effusion.  Status post thoracentesis. EXAM: CHEST 1 VIEW 1 p.m. COMPARISON:  10/25/2017 8:51 a.m. and PET-CT dated 06/19/2017 FINDINGS: There has been reduction in the size of the large left pleural effusion. Moderate effusion persists. No pneumothorax. There is abnormal soft tissue density at the right lung base medially which could represent a mass. There is slight focal pleural thickening laterally at the right lung base which is felt represent loculated fluid. Heart size and vascularity appear normal.  Power port in place. IMPRESSION: 1. No pneumothorax after left thoracentesis. 2. Moderate residual left effusion. 3. Abnormal soft tissue density at the right base medially which could represent a  mass. This was not present on the PET-CT dated 06/19/2017. Electronically Signed   By: Lorriane Shire M.D.   On: 10/25/2017 13:16   Dg Chest 2 View  Result Date: 10/27/2017 CLINICAL DATA:  62 year old male with shortness of breath and cough. Stage IV mantle cell lymphoma. EXAM: CHEST  2 VIEW COMPARISON:  PET-CT 10/25/2017, and earlier. FINDINGS: Right chest porta cath, accessed. Moderate-sized left and small right pleural effusions persist and are associated with bulky pleural nodularity (lateral view) corresponding to the recent hypermetabolic pleural disease on PET. The mediastinal contour is more normal. Visualized tracheal air column is within normal limits. No cardiomegaly. No pneumothorax or pulmonary edema. No acute osseous abnormality identified radiographically. Paucity of bowel gas in the upper abdomen. IMPRESSION: 1. Bulky bilateral pleural tumor with moderate left and small right pleural effusions redemonstrated. See also report of the PET-CT 10/25/2017. 2. No new cardiopulmonary abnormality. Electronically Signed   By: Genevie Ann M.D.   On: 10/27/2017 15:31   Nm Pet Image Restag (ps) Skull Base To Thigh  Result Date: 10/25/2017 CLINICAL DATA:  Subsequent treatment strategy for stage IV mantle cell lymphoma. EXAM: NUCLEAR MEDICINE PET SKULL BASE TO THIGH TECHNIQUE: 10.2 mCi F-18 FDG was injected intravenously. Full-ring PET imaging was performed from the skull base to thigh after the radiotracer. CT data was obtained and used for attenuation correction and anatomic localization. FASTING BLOOD GLUCOSE:  Value: 103 mg/dl COMPARISON:  06/19/2017 FINDINGS: NECK: No hypermetabolic lymph nodes in the neck. CHEST: Marked interval progression of disease in the chest. There is bulky right axillary and subpectoral lymphadenopathy. Hypermetabolic lymphadenopathy identified in the left thoracic inlet, mediastinum, and hilar regions. Interval development of bulky pleural disease bilaterally. Index right axillary  lymph node (image 55 series 4) measures 2.2 cm with SUV max = 12.8. Anterior left pleural mass versus markedly enlarged internal mammary lymph node measuring 3.7 cm (image 63 series 4) demonstrates SUV max = 15.2. 6.0 x 3.2 cm mass lesion in the medial right lung base, adjacent to the heart is probably the same lesion measured on the abdomen and pelvis CT of 02/28/2017 at 2.8 x 1.9 cm. This is  markedly hypermetabolic with SUV max = 18.8. Hypermetabolic left anterior pleural lesion extends into the chest wall on image 84 series 4. Tiny right and moderate left pleural effusions are evident. Right Port-A-Cath tip is in the distal SVC. ABDOMEN/PELVIS: There is omental and/or colonic wall nodular hypermetabolism. 1.4 x 2.0 cm nodule contiguous with the wall of the hepatic flexure (image 139 series 4) is hypermetabolic with SUV max = 41.6. Similar nodule along the distal transverse colon (image 144 series 4) is hypermetabolic. Hypermetabolic lymph node posterior to the stomach is identified on image 141. A large 3.2 x 2.7 cm soft tissue lesion anterior to the right psoas muscle on image 148 of series 4 demonstrates SUV max = 18.5. Other scattered hypermetabolic lymphadenopathy is seen in the retroperitoneum and pelvic sidewall. Marked interval progression of the distal sigmoid/rectal mass with appearance now similar back to the study from 02/28/2017. Atherosclerotic calcification noted in the abdominal aorta. No evidence for bowel obstruction. Large bilateral groin hernias contain small bowel loops without complicating features. SKELETON: Hypermetabolic nodules are seen in the subcutaneous fat of the anterior abdominal wall. There is a hypermetabolic lesion in the upper left rectus musculature (image 103 series 4). Other scattered hypermetabolic nodules are seen in the musculoskeletal anatomy. A 19 mm nodule is seen in the quadriceps musculature of the proximal left thigh. IMPRESSION: 1. Marked interval progression of  hypermetabolic disease in the chest, abdomen, and pelvis. There is hypermetabolic lymphadenopathy in the chest with very marked bilateral pleural disease including a left anterior pleural-based lesion that invades the chest wall. Hypermetabolic lymphadenopathy is seen in the abdomen/pelvis with multiple hypermetabolic lesions along the surface of the colon and the distal sigmoid/rectal lesion has progressed substantially in the interval and is now similar in appearance to the previous CT scan of 02/28/2017. Diffuse subcutaneous and musculoskeletal soft tissue hypermetabolic lesions are associated. 2. Tiny right and moderate left pleural effusions. 3.  Aortic Atherosclerois (ICD10-170.0) 4. Large bilateral groin hernias contain small bowel without complicating features. Electronically Signed   By: Misty Stanley M.D.   On: 10/25/2017 16:04   Dg Chest Port 1 View  Result Date: 10/25/2017 CLINICAL DATA:  Shortness of breath.  History of lymphoma EXAM: PORTABLE CHEST 1 VIEW COMPARISON:  PET-CT June 19, 2017. FINDINGS: Port-A-Cath tip is in superior vena cava. Humor needle is placed in the port. No pneumothorax. There is a large left pleural effusion with consolidation left mid lower lung zones. The right lung is clear. Heart appears mildly enlarged. Visualized pulmonary vascularity is normal. No adenopathy evident in regions that can be assessed. No bone lesions. IMPRESSION: Sizable pleural effusion with left mid and lower lung zone consolidation. Right lung clear. Heart appears prominent. Port-A-Cath tip in superior vena cava. No pneumothorax. Electronically Signed   By: Lowella Grip III M.D.   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 Request is made for diagnostic and therapeutic thoracentesis. EXAM: ULTRASOUND GUIDED DIAGNOSTIC AND THERAPEUTIC THORACENTESIS MEDICATIONS: 1%  lidocaine COMPLICATIONS: None immediate. PROCEDURE: An ultrasound guided thoracentesis was thoroughly discussed with the patient and questions answered. The benefits, risks, alternatives and complications were also discussed. The patient understands and wishes to proceed with the procedure. Written consent was obtained. Ultrasound was performed to localize and mark an adequate pocket of fluid in the left chest. The area was then prepped and draped in the normal sterile fashion. 1% Lidocaine was  used for local anesthesia. Under ultrasound guidance a Safe-T-Centesis catheter was introduced. Thoracentesis was performed. The catheter was removed and a dressing applied. FINDINGS: A total of approximately 2 L of amber fluid was removed. Samples were sent to the laboratory as requested by the clinical team. IMPRESSION: Successful ultrasound guided left thoracentesis yielding 2 L of pleural fluid. The procedure was terminated at this time secondary to his first time maximum of 2 L. Read by: Saverio Danker, PA-C Electronically Signed   By: Markus Daft M.D.   On: 10/25/2017 13:17    ECHO: 11/01/2015: Study Conclusions  - Left ventricle: The cavity size was normal. There was mild concentric hypertrophy. Systolic function was normal. The estimated ejection fraction was in the range of 50% to 55%. Wall motion was normal; there were no regional wall motion abnormalities. Doppler parameters are consistent with abnormal left ventricular relaxation (grade 1 diastolic dysfunction). - Aortic valve: Transvalvular velocity was within the normal range. There was no stenosis. There was no regurgitation. - Mitral valve: There was no regurgitation. - Right ventricle: The cavity size was normal. Wall thickness was normal. Systolic function was normal. - Tricuspid valve: There was no regurgitation. - Inferior vena cava: The vessel was normal in size. The respirophasic diameter changes were in the normal range  (>= 50%), consistent with normal central venous pressure.Study Conclusions  - Left ventricle: The cavity size was normal. There was mild concentric hypertrophy. Systolic function was normal. The estimated ejection fraction was in the range of 50% to 55%. Wall motion was normal; there were no regional wall motion abnormalities. Doppler parameters are consistent with abnormal left ventricular relaxation (grade 1 diastolic dysfunction). - Aortic valve: Transvalvular velocity was within the normal range. There was no stenosis. There was no regurgitation. - Mitral valve: There was no regurgitation. - Right ventricle: The cavity size was normal. Wall thickness was normal. Systolic function was normal. - Tricuspid valve: There was no regurgitation. - Inferior vena cava: The vessel was normal in size. The respirophasic diameter changes were in the normal range (>= 50%), consistent with normal central venous pressure.  ASSESSMENT & PLAN:    62 yo man with   1) h/o previous Stage IVB E Mantle Cell lymphoma with likely gastric involvement, extensive LNadenopathy. ECHO nl EF ECOG PS 2 but activities significantly limited due to being legally blind HIV/Hep C/Hep B neg PET/CT scan after 3 cycles of R CHOP show good overall response. Stomach lesion still with very FDG uptake ? Residual lymphoma versus inflammation.  Patient is status post 6 cycles of R CHOP. PET/CT after 6 cycles shows resolution of hypermetabolic lymphadenopathy and gastric wall activity . Patient was on maintenance Rituxan for 1 year then relapsed. 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 CT chest abdomen pelvis showed multiple areas of lymphadenopathy in the abdomen as well as in the chest suggesting significant recurrence. PET/CT scan 03/25/2017 shows diffuse involvement with  mantle cell lymphoma involving the chest abdomen pelvis and bowels. PET/ct 06/19/2017-- showed significant partial response to BR treatment PET/CT 10/25/2017 - with significant mantle cell lymphoma progression again.  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 Lab Results  Component Value Date   LDH 331 (H) 10/26/2017    4) Rectal urgency due to mantle cell lymphoma causing mass in rectum/sigmoid 5) malignant pleural effusion and b/l pleural involvement. Also with ? Parapneumonia infection - culture growing out  Enterococcus faecalis. ? True infection vs contaminant. (Body fluid cell diff - 90% lymphocytic consistent with lymphoma as opposed to empyema at this time) 6) Shortness related to symptomatic pleural effusion. PLAN -patient notes feeling better after diagnostic thoracentesis and removal of 2.5L of fluid again. -rpt body fluid cell count still with lymphocytic predominance. -continue Ibrutinib 560 mg p.o. Daily.   -if rapidly recurrent pleural fluid /evidence of empyema on rpt thoracentesis might need pleurex placement. -appreciate care from hospital medicine team. -monitor for TLS with Ibrutinib -watch for bleeding issues and cardiac arrhythmias  7) Retinitis pigmentosa causing legal blindness -  is seeing a retina specialist in town. No acute interventions at this time. Notes that his vision is progressively getting worse. His daughter and granddaughter are helping him out at home.   8) Weight loss and protein calorie malnutrition. Resolved. Weight has improved significantly since his diagnosis and back to his baseline weight.  Wt Readings from Last 3 Encounters:  10/25/17 182 lb 12.2 oz (82.9 kg)  09/17/17 204 lb 3.2 oz (92.6 kg)  08/06/17 203 lb 8 oz (92.3 kg)   4) h/o Peri-orbital dermatitis /Conjuncitivitis - patient was seen by the dermatologist and given topical desonide ointment which led to the resolution of his dermatitis. There were not sure about the etiology  but noted then to could not rule out Rituxan is a possibility. Currently his dermatitis has resolved and his conjunctivitis has resolved. He did not have rash anywhere else at the time. No oral sores or other toxicities. This happened about 4 days after Rituxan another chemotherapy. This has completely resolved and patient has not had an further issues with this with several additional doses of Rituxan.  5) Patient Active Problem List   Diagnosis Date Noted  . Protein-calorie malnutrition, severe 10/29/2017  . 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 Pleural effusion on left 10/25/2017  . Counseling regarding advanced care planning and goals of care 03/13/2017  . Rectal mass 03/02/2017  . Colon cancer metastasized to liver and lung([facility]) 03/02/2017  . Rectal bleeding 02/28/2017  . Portacath in place 01/30/2016  . Skin lesion of chest wall 12/19/2015  . Shingles 12/19/2015  . Rash 11/28/2015  . 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 Mantle cell lymphoma of lymph nodes of multiple regions ([place]) 11/01/2015  . Protein-calorie malnutrition ([place]) 11/01/2015  . Abscess of arm, right 10/27/2015  . Lymphoma of lymph nodes of multiple sites ([place]) 10/27/2015  . Shortness of breath 10/27/2015  . Shortness of breath dyspnea 10/27/2015  . Hypoalbuminemia 10/25/2015  . Elevated lipase 10/25/2015  . Inguinal hernia 10/25/2015  . Urinary retention 10/25/2015  . Abdominal pain   . Lymphadenopathy 10/24/2015   I spent 20 minutes counseling the patient face to face. The total time spent in the appointment was 25 minutes and more than 50% was on counseling and direct patient cares.  Sullivan Lone MD [place] AAHIVMS [facility] Hematology/Oncology Physician [facility]  (Office):       [phone] (Work cell):  [phone] (Fax):           [phone]

## 2017-10-29 NOTE — Progress Notes (Signed)
Medications administered by student RN [id number] with supervision of Clinical Instructor Linkon Siverson MSN, RN-BC or patient's assigned RN.   

## 2017-10-29 NOTE — Procedures (Signed)
Ultrasound-guided diagnostic and therapeutic left thoracentesis performed yielding 2.4 liters of hazy,amber fluid. No immediate complications. Follow-up chest x-ray pending. A portion of the fluid was sent to the lab for preordered studies.

## 2017-10-29 NOTE — Progress Notes (Addendum)
Patient ID: DETRELL UMSCHEID, male   DOB: 03/27/1956, 62 y.o.   MRN: [id number]    PROGRESS NOTE  Kristopher Johnson  UUV:[id number] DOB: 11-09-1955 DOA: 10/25/2017  PCP: Patient, No Pcp Per   Brief Narrative:  62 y.o. male, with stage IV mantle cell lymphoma diagnosed in 10/2015, status post R-CHOP 6 cycles followed by maintenance Rituxan, awaiting PET scan, retinitis pigmentosa with legal blindness was sent to the ED from PET scan after pt had increasing shortness of breath.  Course in the ED neck line patient was tachycardic low 100s, tachypneic, O2 sat dropped to 80% on room air, afebrile. Chest x-ray showed large left-sided pleural effusion with possibleconsolidation. 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 Elsworth Soho recommended consulting with CTS team as this could be potential empyema, pt however, would like to have more conservative approach if possible  - pt has been afebrile and WBC has been persistently elevated but not clear if that is related to lymphoma itself - I have also discussed this with Dr. Irene Limbo, plan is to repeat thoracentesis today and follow up on fluid analysis, if bacteria still present, will discuss case with CTS, depending on clinical course, will also need to consider pleureX placement   Active Problems:   Shortness of breath - secondary to the above - repeat CXR with progressively bulky bilateral pleural tumor  with moderate left and small right pleural effusions - plan for repeat thoracentesis today and further recommendations pending clinical progress     Mantle cell lymphoma of lymph nodes of multiple regions [facility]) - follows with Dr. Irene Limbo, appreciate his assistance  - management per Dr. 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 Irene Limbo, also recommended stool softeners, pt says he does not know if it helps     Protein-calorie malnutrition ([place]) - in the setting of malignancy  - tolerating diet so far   DVT prophylaxis: Lovenox  Code Status: Full  Family Communication: pt at bedside  Disposition Plan: to be determined   Consultants:   IR  Dr. Irene Limbo Oncologist   Procedures:   Left thoracentesis   Antimicrobials:   Vancomycin 10/26/2017 --> 01/22  Ampicillin 01/22 -->  Subjective: Pt reports less rectal pain but worsening dyspnea.   Objective: Vitals:   10/28/17 0500 10/28/17 1427 10/28/17 2104 10/29/17 0528  BP: 123/77 134/77 125/75 127/81  Pulse: 77 87 99 82  Resp: (!) 22 20 20 20   Temp: 97.8 F (36.6 C) 97.7 F (36.5 C) (!) 97.5 F (36.4 C) (!) 97.5 F (36.4 C)  TempSrc: Oral Oral Oral Oral  SpO2: 91% 93% 95% 91%  Weight:  Height:        Intake/Output Summary (Last 24 hours) at 10/29/2017 0823 Last data filed at 10/29/2017 0528 Gross per 24 hour  Intake 400 ml  Output -  Net 400 ml   Filed Weights   10/25/17 0838 10/25/17 1631  Weight: 92.5 kg (204 lb) 82.9 kg (182 lb 12.2 oz)    Physical Exam  Constitutional: Appears calm, NAD CVS: RRR, S1/S2 +, no murmurs, no gallops, no carotid bruit.  Pulmonary: mild tachypnea and diminished breath sounds bilaterally  Abdominal: Soft. BS +,  no distension, tenderness,  rebound or guarding.  Musculoskeletal: Normal range of motion. No edema and no tenderness.  Neuro: Alert. Normal reflexes, muscle tone coordination. No cranial nerve deficit.  Data Reviewed: I have personally reviewed following labs and imaging studies  CBC: Recent Labs  Lab 10/25/17 0844 10/25/17 0918 10/26/17 0441 10/27/17 0404 10/28/17 0500  WBC 17.4*  --  14.3* 16.1* 16.7*  NEUTROABS 15.2*  --   --   --   --   HGB 13.2 15.6 12.2* 11.7* 11.4*  HCT 40.8 46.0 38.1* 37.1* 36.1*  MCV 91.9  --  92.7 91.6 92.1  PLT 352  --  305 290 481   Basic Metabolic Panel: Recent Labs  Lab 10/25/17 0844 10/25/17 0918 10/26/17 0441 10/27/17 0404 10/28/17 0500  NA 139 139 138 138 139  K 4.1 3.6 3.5 3.5 4.2  CL 100* 99* 102 105 107  CO2 26  --  27 26 25   GLUCOSE 155* 154* 160* 182* 204*  BUN 13 12 12 11 13   CREATININE 1.12 0.80 0.92 0.79 0.68  CALCIUM 9.1  --  8.7* 8.8* 9.1  PHOS  --   --  2.8  --   --    Liver Function Tests: Recent Labs  Lab 10/25/17 0844  AST 27  ALT 18  ALKPHOS 79  BILITOT 1.4*  PROT 6.9  ALBUMIN 3.1*   Coagulation Profile: Recent Labs  Lab 10/25/17 0844  INR 1.02   CBG: Recent Labs  Lab 10/25/17 0834 10/25/17 1339  GLUCAP 164* 103*   Urine analysis:    Component Value Date/Time   COLORURINE YELLOW 10/24/2015 1705   APPEARANCEUR CLOUDY (A) 10/24/2015 1705   LABSPEC 1.024 10/24/2015 1705   PHURINE 5.0 10/24/2015 1705   GLUCOSEU NEGATIVE 10/24/2015 1705   HGBUR NEGATIVE 10/24/2015 1705   [place] 10/24/2015 [place] 10/24/2015 1705   PROTEINUR 30 (A) 10/24/2015 1705   NITRITE NEGATIVE 10/24/2015 1705   LEUKOCYTESUR NEGATIVE 10/24/2015 1705   Recent Results (from the past 240 hour(s))  Culture, body fluid-bottle     Status: Abnormal   Collection Time: 10/25/17 12:33 PM  Result Value Ref Range Status   Specimen Description FLUID PLEURAL  Final   Special Requests NONE  Final   Gram Stain   Final    GRAM  POSITIVE COCCI ANAEROBIC BOTTLE ONLY CRITICAL RESULT CALLED TO, READ BACK BY AND VERIFIED WITH: Bettina Gavia RN, AT [phone] 10/26/17 BY Rush Landmark Performed at [facility], [address], [place], [place] [address]    Culture ENTEROCOCCUS FAECALIS (A)  Final   Report Status 10/28/2017 FINAL  Final   Organism ID, Bacteria ENTEROCOCCUS FAECALIS  Final      Susceptibility   Enterococcus faecalis - MIC*    AMPICILLIN <=2 SENSITIVE Sensitive     VANCOMYCIN 1 SENSITIVE Sensitive     GENTAMICIN SYNERGY SENSITIVE Sensitive     *  ENTEROCOCCUS FAECALIS  Gram stain     Status: None   Collection Time: 10/25/17 12:33 PM  Result Value Ref Range Status   Specimen Description FLUID  Final   Special Requests NONE  Final   Gram Stain   Final    ABUNDANT WBC PRESENT,BOTH PMN AND MONONUCLEAR NO ORGANISMS SEEN Performed at [facility], [address], [place], [place] [address]    Report Status 10/25/2017 FINAL  Final    Radiology Studies: Dg Chest 2 View Result Date: 10/27/2017 1. Bulky bilateral pleural tumor with moderate left and small right pleural effusions redemonstrated. See also report of the PET-CT 10/25/2017.  2. No new cardiopulmonary abnormality.   Scheduled Meds: . dexamethasone  4 mg Oral Q12H  . enoxaparin (LOVENOX) injection  40 mg Subcutaneous Q24H  . multivitamin with minerals  1 tablet Oral Daily  . protein supplement  8 oz Oral BID BM  . senna-docusate  1 tablet Oral BID   Continuous Infusions: . vancomycin 1,000 mg (10/29/17 0528)     LOS: 4 days   Time spent: 25 minutes   Kristopher Ramsay, MD [facility] Pager [phone]  If 7PM-7AM, please contact night-coverage [email or web address] Password [facility] 10/29/2017, 8:23 AM

## 2017-10-30 DIAGNOSIS — R0602 Shortness of breath: Secondary | ICD-10-CM

## 2017-10-30 LAB — CBC
HCT: 38.1 % — ABNORMAL LOW (ref 39.0–52.0)
HEMOGLOBIN: 12.3 g/dL — AB (ref 13.0–17.0)
MCH: 29.4 pg (ref 26.0–34.0)
MCHC: 32.3 g/dL (ref 30.0–36.0)
MCV: 90.9 fL (ref 78.0–100.0)
Platelets: 275 10*3/uL (ref 150–400)
RBC: 4.19 MIL/uL — AB (ref 4.22–5.81)
RDW: 14 % (ref 11.5–15.5)
WBC: 18.3 10*3/uL — ABNORMAL HIGH (ref 4.0–10.5)

## 2017-10-30 LAB — PH, BODY FLUID: pH, Body Fluid: 7.5

## 2017-10-30 LAB — AMYLASE, PLEURAL OR PERITONEAL FLUID: AMYLASE FL: 23 U/L

## 2017-10-30 LAB — BASIC METABOLIC PANEL
Anion gap: 9 (ref 5–15)
BUN: 13 mg/dL (ref 6–20)
CHLORIDE: 100 mmol/L — AB (ref 101–111)
CO2: 29 mmol/L (ref 22–32)
Calcium: 8.6 mg/dL — ABNORMAL LOW (ref 8.9–10.3)
Creatinine, Ser: 0.8 mg/dL (ref 0.61–1.24)
GFR calc Af Amer: >60 mL/min (ref >60)
GFR calc non Af Amer: >60 mL/min (ref >60)
GLUCOSE: 239 mg/dL — AB (ref 65–99)
POTASSIUM: 4 mmol/L (ref 3.5–5.1)
Sodium: 138 mmol/L (ref 135–145)

## 2017-10-30 LAB — HEPATIC FUNCTION PANEL
ALK PHOS: 67 U/L (ref 38–126)
ALT: 32 U/L (ref 17–63)
AST: 19 U/L (ref 15–41)
Albumin: 2.4 g/dL — ABNORMAL LOW (ref 3.5–5.0)
BILIRUBIN INDIRECT: 0.3 mg/dL (ref 0.3–0.9)
Bilirubin, Direct: <0.1 mg/dL — ABNORMAL LOW (ref 0.1–0.5)
TOTAL PROTEIN: 5 g/dL — AB (ref 6.5–8.1)
Total Bilirubin: 0.4 mg/dL (ref 0.3–1.2)

## 2017-10-30 LAB — URIC ACID: Uric Acid, Serum: 4.3 mg/dL — ABNORMAL LOW (ref 4.4–7.6)

## 2017-10-30 NOTE — Telephone Encounter (Signed)
Oral Oncology Patient Advocate Encounter  Prior Authorization for Kristopher Johnson has been approved.    PA# [id number] Effective dates: 10/28/2017 until further notice.   Oral Oncology Clinic will continue to follow.   [name]. [name], [name] [name] [phone] 10/30/2017 1:12 PM

## 2017-10-30 NOTE — Progress Notes (Signed)
PT Cancellation Note  Patient Details Name: JHASE CREPPEL MRN: [id number] DOB: 07-30-1956   Cancelled Treatment:    Reason Eval/Treat Not Completed: PT screened, no needs identified, will sign off. Spoke with pt who denied need for PT services. He politely declined participation. Will sign off at pt's request. Thanks.    Weston Anna, MPT Pager: [phone]

## 2017-10-30 NOTE — Progress Notes (Signed)
Patient ID: Kristopher Johnson, male   DOB: Jan 12, 1956, 62 y.o.   MRN: [id number]    PROGRESS NOTE  Kristopher Johnson  UXN:[id number] DOB: 1955-10-24 DOA: 10/25/2017  PCP: Patient, No Pcp Per   Brief Narrative:  62 y.o. male, with stage IV mantle cell lymphoma diagnosed in 10/2015, status post R-CHOP 6 cycles followed by maintenance Rituxan, awaiting PET scan, retinitis pigmentosa with legal blindness was sent to the ED from PET scan after pt had increasing shortness of breath.  Course in the ED neck line patient was tachycardic low 100s, tachypneic, O2 sat dropped to 80% on room air, afebrile. Chest x-ray showed large left-sided pleural effusion with possibleconsolidation. 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  - s/p left thoracentesis 1/17, 2 L fluid removed, fluid with enterococcus faecalis, sensitive to vanc and ampicillin - suspect this is related to ongoing pleural effusion and bulky pleural tumor as noted on recent PET scan, confirmed with CXR - changed vancomycin to ampicillin 01/22 - based on clinical status it appears that this is now re accumulating fluid and certainly worrisome for underlying empyema however, body fluid cell diff with 90% lymphocytes and thus consistent with lymphoma as opposed to empyema - pt had second thoracentesis on 01/22 and 2.4 L fluid removed and sent for analysis and this is still pending  - pt now on Ibrutinib 500 mg PO QD - if fluid re accumulates rapidly again, will ned to consider pleurex cath placement   Active Problems:   Mantle cell lymphoma of lymph nodes of multiple regions [facility] - follows with Dr. Irene Limbo, appreciate his assistance  - management per Dr. 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 Irene Limbo, also recommended stool softeners, pt now with diarrhea, will hold off on stool softeners to see how pt does     Diarrhea - hold stool softeners for now - low threshold for testing for C. Diff if WBC up and pt's clinically worse     Protein-calorie malnutrition ([place]) - in the setting of malignancy  - tolerating diet so far   DVT prophylaxis: Lovenox  Code Status: Full  Family Communication: pt at bedside  Disposition Plan: to be determined   Consultants:   IR  Dr. Irene Limbo Oncologist   Procedures:   Left thoracentesis   Antimicrobials:   Vancomycin 10/26/2017 --> 01/22  Ampicillin 01/22 -->  Subjective: Pt reports feeling better this AM. Had more diarrhea this AM.  Objective: Vitals:   10/29/17 1530 10/29/17 1619 10/29/17 1954 10/30/17 0549  BP: 109/73 115/63 122/70 114/74  Pulse:  77 (!) 110 87  Resp:  20 18 18   Temp:  97.8 F (36.6 C) 98.1 F (36.7 C) 98.3 F (36.8 C)  TempSrc:  Oral Oral Oral  SpO2:  94% 93% 94%  Weight:      Height:        Intake/Output Summary (Last 24 hours) at 10/30/2017 1103 Last data filed at 10/30/2017 1020 Gross per 24 hour  Intake 250 ml  Output 300 ml  Net -50 ml   Filed Weights   10/25/17 0838 10/25/17 1631  Weight: 92.5 kg (204 lb) 82.9 kg (182 lb 12.2 oz)   Physical Exam  Constitutional: Appears calm, NAD CVS: RRR, S1/S2 +, no murmurs, no gallops, no carotid bruit.  Pulmonary: Effort and breath sounds normal, still diminished air movement R > L side  Abdominal: Soft. BS +,  no distension, tenderness, rebound or guarding.  Musculoskeletal: Normal range of motion. No edema and no tenderness.  Neuro: Alert. Normal reflexes, muscle tone coordination. No cranial nerve deficit.  Data Reviewed: I have personally reviewed following labs  and imaging studies  CBC: Recent Labs  Lab 10/25/17 0844 10/25/17 0918 10/26/17 0441 10/27/17 0404 10/28/17 0500 10/30/17 0402  WBC 17.4*  --  14.3* 16.1* 16.7* 18.3*  NEUTROABS 15.2*  --   --   --   --   --   HGB 13.2 15.6 12.2* 11.7* 11.4* 12.3*  HCT 40.8 46.0 38.1* 37.1* 36.1* 38.1*  MCV 91.9  --  92.7 91.6 92.1 90.9  PLT 352  --  305 290 290 254   Basic Metabolic Panel: Recent Labs  Lab 10/25/17 0844 10/25/17 0918 10/26/17 0441 10/27/17 0404 10/28/17 0500 10/30/17 0402  NA 139 139 138 138 139 138  K 4.1 3.6 3.5 3.5 4.2 4.0  CL 100* 99* 102 105 107 100*  CO2 26  --  27 26 25 29   GLUCOSE 155* 154* 160* 182* 204* 239*  BUN 13 12 12 11 13 13   CREATININE 1.12 0.80 0.92 0.79 0.68 0.80  CALCIUM 9.1  --  8.7* 8.8* 9.1 8.6*  PHOS  --   --  2.8  --   --   --    Liver Function Tests: Recent Labs  Lab 10/25/17 0844  AST 27  ALT 18  ALKPHOS 79  BILITOT 1.4*  PROT 6.9  ALBUMIN 3.1*   Coagulation Profile: Recent Labs  Lab 10/25/17 0844  INR 1.02   CBG: Recent Labs  Lab 10/25/17 0834 10/25/17 1339  GLUCAP 164* 103*   Urine analysis:    Component Value Date/Time   COLORURINE YELLOW 10/24/2015 1705   APPEARANCEUR CLOUDY (A) 10/24/2015 1705   LABSPEC 1.024 10/24/2015 1705   PHURINE 5.0 10/24/2015 1705   GLUCOSEU NEGATIVE 10/24/2015 1705   HGBUR NEGATIVE 10/24/2015 1705   [place] 10/24/2015 [place] 10/24/2015 1705   PROTEINUR 30 (A) 10/24/2015 1705   NITRITE NEGATIVE 10/24/2015 1705   LEUKOCYTESUR NEGATIVE 10/24/2015 1705   Recent Results (from the past 240 hour(s))  Fungus Culture With Stain     Status: None (Preliminary result)   Collection Time: 10/25/17 12:33 PM  Result Value Ref Range Status   Fungus Stain Final report  Final    Comment: (NOTE) Performed At: [facility] [place], [place] [id number] Rush Farmer MD [id number]    Fungus (Mycology) Culture PENDING  Incomplete   Fungal  Source PLEURAL  Final  Culture, body fluid-bottle     Status: Abnormal   Collection Time: 10/25/17 12:33 PM  Result Value Ref Range Status   Specimen Description FLUID PLEURAL  Final   Special Requests NONE  Final   Johnson Stain   Final    Johnson POSITIVE COCCI ANAEROBIC BOTTLE ONLY CRITICAL RESULT CALLED TO, READ BACK BY AND VERIFIED WITH: Bettina Gavia RN, AT [phone] 10/26/17 BY Rush Landmark Performed at [facility], [place] [address], [place], [place] [address]  Culture ENTEROCOCCUS FAECALIS (A)  Final   Report Status 10/28/2017 FINAL  Final   Organism ID, Bacteria ENTEROCOCCUS FAECALIS  Final      Susceptibility   Enterococcus faecalis - MIC*    AMPICILLIN <=2 SENSITIVE Sensitive     VANCOMYCIN 1 SENSITIVE Sensitive     GENTAMICIN SYNERGY SENSITIVE Sensitive     * ENTEROCOCCUS FAECALIS  Johnson stain     Status: None   Collection Time: 10/25/17 12:33 PM  Result Value Ref Range Status   Specimen Description FLUID  Final   Special Requests NONE  Final   Johnson Stain   Final    ABUNDANT WBC PRESENT,BOTH PMN AND MONONUCLEAR NO ORGANISMS SEEN Performed at [facility], [place] [address] [place], [place] [address]    Report Status 10/25/2017 FINAL  Final  Fungus Culture Result     Status: None   Collection Time: 10/25/17 12:33 PM  Result Value Ref Range Status   Result 1 Comment  Final    Comment: (NOTE) KOH/Calcofluor preparation:  no fungus observed. Performed At: [facility] [place], [place] [id number] Rush Farmer MD [id number]   Body fluid culture     Status: None (Preliminary result)   Collection Time: 10/29/17  3:58 PM  Result Value Ref Range Status   Specimen Description PLEURAL  Final   Special Requests NONE  Final   Johnson Stain   Final    MODERATE WBC PRESENT, PREDOMINANTLY MONONUCLEAR NO ORGANISMS SEEN    Culture   Final    NO GROWTH < 12 HOURS Performed at [facility], [address], [place], [place] [address]    Report  Status PENDING  Incomplete    Radiology Studies: Dg Chest 2 View Result Date: 10/27/2017 1. Bulky bilateral pleural tumor with moderate left and small right pleural effusions redemonstrated. See also report of the PET-CT 10/25/2017.  2. No new cardiopulmonary abnormality.   Scheduled Meds: . dexamethasone  4 mg Oral Q12H  . enoxaparin (LOVENOX) injection  40 mg Subcutaneous Q24H  . Ibrutinib  560 mg Oral Q breakfast  . multivitamin with minerals  1 tablet Oral Daily  . protein supplement  8 oz Oral BID BM  . senna-docusate  1 tablet Oral BID  . sodium chloride flush  10-40 mL Intracatheter Q12H   Continuous Infusions: . ampicillin (OMNIPEN) IV Stopped (10/30/17 1020)     LOS: 5 days   Time spent: 25 minutes   Faye Ramsay, MD [facility] Pager [phone]  If 7PM-7AM, please contact night-coverage [email or web address] Password [email or web address] 10/30/2017, 11:03 AM

## 2017-10-30 NOTE — Progress Notes (Signed)
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 Patient chooses not to pursue AutoHSCT based strategies which is quite reasonable considering his functional challenges with legal blindness and limited social support.   S/p 6 cycles of Bendamustine/Rituxan    INTERVAL HISTORY  Patient notes no acute new concerns since yesterday. No fevers/chills/night sweats. No acute new toxicities from Ibrutinib. No bleeding or bruising. Bilirubin down from 1.4 to WNL. Uric acid stable.   PAST MEDICAL HISTORY:  Past Medical History:  Diagnosis Date  . Abscess of arm, right 10/27/2015  . Lymphoma of lymph nodes of multiple sites ([place]) 10/27/2015  . 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 Shortness of breath 10/27/2015    SURGICAL HISTORY: Past Surgical History:  Procedure Laterality Date  . Cataract surgery     Patient notes she has had bilateral Surgery in 1998 and 99  . FLEXIBLE SIGMOIDOSCOPY N/A 03/01/2017   Procedure: FLEXIBLE SIGMOIDOSCOPY;  Surgeon: Manus Gunning, MD;  Location: [facility];  Service: Gastroenterology;  Laterality: N/A;  . TONSILLECTOMY     about 62years old    SOCIAL HISTORY: Social History   Socioeconomic History  . Marital status: Widowed    Spouse name: Not on file  . Number of children: Not on file  . Years of education: Not on file  . Highest education level: Not on file  Social Needs  . Financial resource strain: Not on file  .  Food insecurity - worry: Not on file  . Food insecurity - inability: Not on file  . Transportation needs - medical: Not on file  . Transportation needs - non-medical: Not on file  Occupational History  . Not on file  Tobacco Use  . Smoking status: Current Every Day Smoker    Packs/day: 1.00    Years: 33.00    Pack years: 33.00  . Smokeless tobacco: Never Used  Substance and Sexual Activity  . Alcohol use: No  . Drug use: No  . Sexual activity: Not on file    Comment: Disabled, retinis pigmentosa legally blind, 4children MPOA Lynnn(cousin)  Other Topics Concern  . Not on file  Social History Narrative  . Not on file    FAMILY HISTORY: Family History  Problem Relation Age of Onset  . Colon cancer Neg Hx     ALLERGIES:  has No Known Allergies.  MEDICATIONS:  Current Facility-Administered Medications  Medication Dose Route Frequency Provider Last Rate Last Dose  . acetaminophen (TYLENOL) tablet 650 mg  650 mg Oral Q6H PRN Dhungel, Nishant, MD       Or  . acetaminophen (TYLENOL) suppository 650 mg  650 mg Rectal Q6H PRN Dhungel, Nishant, MD      . ampicillin (OMNIPEN) 2 g in sodium chloride 0.9 % 50 mL IVPB  2 g Intravenous Q4H Theodis Blaze, MD   Stopped at 10/30/17 1326  . dexamethasone (DECADRON) tablet 4 mg  4 mg Oral Q12H Theodis Blaze, MD   4 mg at 10/30/17 0955  . enoxaparin (  LOVENOX) injection 40 mg  40 mg Subcutaneous Q24H Dhungel, Nishant, MD      . Ibrutinib TABS 560 mg  560 mg Oral Q breakfast Theodis Blaze, MD   560 mg at 10/30/17 1030  . multivitamin with minerals tablet 1 tablet  1 tablet Oral Daily Theodis Blaze, MD   1 tablet at 10/30/17 0955  . ondansetron (ZOFRAN) tablet 4 mg  4 mg Oral Q6H PRN Dhungel, Nishant, MD       Or  . ondansetron (ZOFRAN) injection 4 mg  4 mg Intravenous Q6H PRN Dhungel, Nishant, MD      . oxyCODONE (Oxy IR/ROXICODONE) immediate release tablet 5 mg  5 mg Oral Q4H PRN Theodis Blaze, MD      . protein supplement [facility] CHICKEN  SOUP) powder 8 oz  8 oz Oral BID BM Theodis Blaze, MD   8 oz at 10/28/17 1619  . sodium chloride flush (NS) 0.9 % injection 10-40 mL  10-40 mL Intracatheter Q12H Theodis Blaze, MD   10 mL at 10/29/17 2130    REVIEW OF SYSTEMS:    A 10+ POINT REVIEW OF SYSTEMS WAS OBTAINED including neurology, dermatology, psychiatry, cardiac, respiratory, lymph, extremities, GI, GU, Musculoskeletal, constitutional, breasts, reproductive, HEENT.  All pertinent positives are noted in the HPI.  All others are negative.  PHYSICAL EXAMINATION:  ECOG PERFORMANCE STATUS: 2 - Symptomatic, <50% confined to bed .VS as noted in [place], in no acute distress and comfortable SKIN: some nodular skin lesions over extremities -much improved OROPHARYNX:no exudate, no erythema and lips, multiple carious teeth with significant periodontal disease. NECK: supple, no JVD, thyroid normal size, non-tender, without nodularity LYMPH:  no palpable lymphadenopathy in the cervical, axillary or inguinal LUNGS: clear to auscultation with normal respiratory effort HEART: regular rate & rhythm,  no murmurs and no lower extremity edema ABDOMEN: abdomen soft, non-tender, normoactive bowel sounds  Musculoskeletal: no cyanosis of digits and no clubbing  PSYCH: alert & oriented x 3 with fluent speech NEURO: no focal motor/sensory deficits  LABORATORY DATA:  I have reviewed the data as listed   CBC Latest Ref Rng & Units 10/30/2017 10/28/2017 10/27/2017  WBC 4.0 - 10.5 K/uL 18.3(H) 16.7(H) 16.1(H)  Hemoglobin 13.0 - 17.0 g/dL 12.3(L) 11.4(L) 11.7(L)  Hematocrit 39.0 - 52.0 % 38.1(L) 36.1(L) 37.1(L)  Platelets 150 - 400 K/uL 275 290 290    . CMP Latest Ref Rng & Units 10/30/2017 10/28/2017 10/27/2017  Glucose 65 - 99 mg/dL 239(H) 204(H) 182(H)  BUN 6 - 20 mg/dL 13 13 11   Creatinine 0.61 - 1.24 mg/dL 0.80 0.68 0.79  Sodium 135 - 145 mmol/L 138 139 138  Potassium 3.5 - 5.1 mmol/L 4.0 4.2 3.5  Chloride 101 - 111 mmol/L 100(L)  107 105  CO2 22 - 32 mmol/L 29 25 26   Calcium 8.9 - 10.3 mg/dL 8.6(L) 9.1 8.8(L)  Total Protein 6.5 - 8.1 g/dL 5.0(L) - -  Total Bilirubin 0.3 - 1.2 mg/dL 0.4 - -  Alkaline Phos 38 - 126 U/L 67 - -  AST 15 - 41 U/L 19 - -  ALT 17 - 63 U/L 32 - -     RADIOGRAPHIC STUDIES: I have personally reviewed the radiological images as listed and agreed with the findings in the report. Dg Chest 1 View  Result Date: 10/29/2017 CLINICAL DATA:  Status post left thoracentesis. EXAM: CHEST 1 VIEW COMPARISON:  10/27/2017 FINDINGS: Right chest wall port a catheter noted with tip at  the cavoatrial junction. There has been interval decrease in volume of the right pleural effusion from previous exam. The left pleural effusion is also decreased in the interval. No left-sided pneumothorax status post thoracentesis. Resolution of previous interstitial edema. IMPRESSION: 1. No pneumothorax following left-sided thoracentesis. Electronically Signed   By: Kerby Moors M.D.   On: 10/29/2017 16:22   Dg Chest 1 View  Result Date: 10/25/2017 CLINICAL DATA:  Left pleural effusion.  Status post thoracentesis. EXAM: CHEST 1 VIEW 1 p.m. COMPARISON:  10/25/2017 8:51 a.m. and PET-CT dated 06/19/2017 FINDINGS: There has been reduction in the size of the large left pleural effusion. Moderate effusion persists. No pneumothorax. There is abnormal soft tissue density at the right lung base medially which could represent a mass. There is slight focal pleural thickening laterally at the right lung base which is felt represent loculated fluid. Heart size and vascularity appear normal.  Power port in place. IMPRESSION: 1. No pneumothorax after left thoracentesis. 2. Moderate residual left effusion. 3. Abnormal soft tissue density at the right base medially which could represent a mass. This was not present on the PET-CT dated 06/19/2017. Electronically Signed   By: Lorriane Shire M.D.   On: 10/25/2017 13:16   Dg Chest 2 View  Result Date:  10/27/2017 CLINICAL DATA:  62 year old male with shortness of breath and cough. Stage IV mantle cell lymphoma. EXAM: CHEST  2 VIEW COMPARISON:  PET-CT 10/25/2017, and earlier. FINDINGS: Right chest porta cath, accessed. Moderate-sized left and small right pleural effusions persist and are associated with bulky pleural nodularity (lateral view) corresponding to the recent hypermetabolic pleural disease on PET. The mediastinal contour is more normal. Visualized tracheal air column is within normal limits. No cardiomegaly. No pneumothorax or pulmonary edema. No acute osseous abnormality identified radiographically. Paucity of bowel gas in the upper abdomen. IMPRESSION: 1. Bulky bilateral pleural tumor with moderate left and small right pleural effusions redemonstrated. See also report of the PET-CT 10/25/2017. 2. No new cardiopulmonary abnormality. Electronically Signed   By: Genevie Ann M.D.   On: 10/27/2017 15:31   Nm Pet Image Restag (ps) Skull Base To Thigh  Result Date: 10/25/2017 CLINICAL DATA:  Subsequent treatment strategy for stage IV mantle cell lymphoma. EXAM: NUCLEAR MEDICINE PET SKULL BASE TO THIGH TECHNIQUE: 10.2 mCi F-18 FDG was injected intravenously. Full-ring PET imaging was performed from the skull base to thigh after the radiotracer. CT data was obtained and used for attenuation correction and anatomic localization. FASTING BLOOD GLUCOSE:  Value: 103 mg/dl COMPARISON:  06/19/2017 FINDINGS: NECK: No hypermetabolic lymph nodes in the neck. CHEST: Marked interval progression of disease in the chest. There is bulky right axillary and subpectoral lymphadenopathy. Hypermetabolic lymphadenopathy identified in the left thoracic inlet, mediastinum, and hilar regions. Interval development of bulky pleural disease bilaterally. Index right axillary lymph node (image 55 series 4) measures 2.2 cm with SUV max = 12.8. Anterior left pleural mass versus markedly enlarged internal mammary lymph node measuring 3.7 cm  (image 63 series 4) demonstrates SUV max = 15.2. 6.0 x 3.2 cm mass lesion in the medial right lung base, adjacent to the heart is probably the same lesion measured on the abdomen and pelvis CT of 02/28/2017 at 2.8 x 1.9 cm. This is markedly hypermetabolic with SUV max = 15.4. Hypermetabolic left anterior pleural lesion extends into the chest wall on image 84 series 4. Tiny right and moderate left pleural effusions are evident. Right Port-A-Cath tip is in the distal SVC. ABDOMEN/PELVIS: There is  omental and/or colonic wall nodular hypermetabolism. 1.4 x 2.0 cm nodule contiguous with the wall of the hepatic flexure (image 139 series 4) is hypermetabolic with SUV max = 67.8. Similar nodule along the distal transverse colon (image 144 series 4) is hypermetabolic. Hypermetabolic lymph node posterior to the stomach is identified on image 141. A large 3.2 x 2.7 cm soft tissue lesion anterior to the right psoas muscle on image 148 of series 4 demonstrates SUV max = 18.5. Other scattered hypermetabolic lymphadenopathy is seen in the retroperitoneum and pelvic sidewall. Marked interval progression of the distal sigmoid/rectal mass with appearance now similar back to the study from 02/28/2017. Atherosclerotic calcification noted in the abdominal aorta. No evidence for bowel obstruction. Large bilateral groin hernias contain small bowel loops without complicating features. SKELETON: Hypermetabolic nodules are seen in the subcutaneous fat of the anterior abdominal wall. There is a hypermetabolic lesion in the upper left rectus musculature (image 103 series 4). Other scattered hypermetabolic nodules are seen in the musculoskeletal anatomy. A 19 mm nodule is seen in the quadriceps musculature of the proximal left thigh. IMPRESSION: 1. Marked interval progression of hypermetabolic disease in the chest, abdomen, and pelvis. There is hypermetabolic lymphadenopathy in the chest with very marked bilateral pleural disease including a  left anterior pleural-based lesion that invades the chest wall. Hypermetabolic lymphadenopathy is seen in the abdomen/pelvis with multiple hypermetabolic lesions along the surface of the colon and the distal sigmoid/rectal lesion has progressed substantially in the interval and is now similar in appearance to the previous CT scan of 02/28/2017. Diffuse subcutaneous and musculoskeletal soft tissue hypermetabolic lesions are associated. 2. Tiny right and moderate left pleural effusions. 3.  Aortic Atherosclerois (ICD10-170.0) 4. Large bilateral groin hernias contain small bowel without complicating features. Electronically Signed   By: Misty Stanley M.D.   On: 10/25/2017 16:04   Dg Chest Port 1 View  Result Date: 10/25/2017 CLINICAL DATA:  Shortness of breath.  History of lymphoma EXAM: PORTABLE CHEST 1 VIEW COMPARISON:  PET-CT June 19, 2017. FINDINGS: Port-A-Cath tip is in superior vena cava. Humor needle is placed in the port. No pneumothorax. There is a large left pleural effusion with consolidation left mid lower lung zones. The right lung is clear. Heart appears mildly enlarged. Visualized pulmonary vascularity is normal. No adenopathy evident in regions that can be assessed. No bone lesions. IMPRESSION: Sizable pleural effusion with left mid and lower lung zone consolidation. Right lung clear. Heart appears prominent. Port-A-Cath tip in superior vena cava. No pneumothorax. Electronically Signed   By: Lowella Grip III M.D.   On: 10/25/2017 09:25   US Thoracentesis Asp Pleural Space W/img Guide  Result Date: 10/30/2017 INDICATION: Patient with history of stage IV mantle cell lymphoma with recurrent malignant left pleural effusion, dyspnea, pleural fluid with Enterococcus in recent cultures. Request made for diagnostic and therapeutic left thoracentesis. EXAM: ULTRASOUND GUIDED DIAGNOSTIC AND THERAPEUTIC LEFT THORACENTESIS MEDICATIONS: None. COMPLICATIONS: None immediate. PROCEDURE: An ultrasound  guided thoracentesis was thoroughly discussed with the patient and questions answered. The benefits, risks, alternatives and complications were also discussed. The patient understands and wishes to proceed with the procedure. Written consent was obtained. Ultrasound was performed to localize and mark an adequate pocket of fluid in the left chest. The area was then prepped and draped in the normal sterile fashion. 1% Lidocaine was used for local anesthesia. Under ultrasound guidance a 6 Fr Safe-T-Centesis catheter was introduced. Thoracentesis was performed. The catheter was removed and a dressing applied. FINDINGS: A  total of approximately 2.4 liters of hazy, amber fluid was removed. Samples were sent to the laboratory as requested by the clinical team. IMPRESSION: Successful ultrasound guided diagnostic and therapeutic left thoracentesis yielding 2.4 liters of pleural fluid. Read by: Rowe Alfonse, PA-C Electronically Signed   By: Sandi Mariscal M.D.   On: 10/29/2017 15:48   US Thoracentesis Asp Pleural Space W/img Guide  Result Date: 10/25/2017 INDICATION: Mantle cell lymphoma with a left-sided pleural effusion. Request is made for diagnostic and therapeutic thoracentesis. EXAM: ULTRASOUND GUIDED DIAGNOSTIC AND THERAPEUTIC THORACENTESIS MEDICATIONS: 1% lidocaine COMPLICATIONS: None immediate. PROCEDURE: An ultrasound guided thoracentesis was thoroughly discussed with the patient and questions answered. The benefits, risks, alternatives and complications were also discussed. The patient understands and wishes to proceed with the procedure. Written consent was obtained. Ultrasound was performed to localize and mark an adequate pocket of fluid in the left chest. The area was then prepped and draped in the normal sterile fashion. 1% Lidocaine was used for local anesthesia. Under ultrasound guidance a Safe-T-Centesis catheter was introduced. Thoracentesis was performed. The catheter was removed and a dressing applied.  FINDINGS: A total of approximately 2 L of amber fluid was removed. Samples were sent to the laboratory as requested by the clinical team. IMPRESSION: Successful ultrasound guided left thoracentesis yielding 2 L of pleural fluid. The procedure was terminated at this time secondary to his first time maximum of 2 L. Read by: Saverio Danker, PA-C Electronically Signed   By: Markus Daft M.D.   On: 10/25/2017 13:17    ECHO: 11/01/2015: Study Conclusions  - Left ventricle: The cavity size was normal. There was mild concentric hypertrophy. Systolic function was normal. The estimated ejection fraction was in the range of 50% to 55%. Wall motion was normal; there were no regional wall motion abnormalities. Doppler parameters are consistent with abnormal left ventricular relaxation (grade 1 diastolic dysfunction). - Aortic valve: Transvalvular velocity was within the normal range. There was no stenosis. There was no regurgitation. - Mitral valve: There was no regurgitation. - Right ventricle: The cavity size was normal. Wall thickness was normal. Systolic function was normal. - Tricuspid valve: There was no regurgitation. - Inferior vena cava: The vessel was normal in size. The respirophasic diameter changes were in the normal range (>= 50%), consistent with normal central venous pressure.Study Conclusions  - Left ventricle: The cavity size was normal. There was mild concentric hypertrophy. Systolic function was normal. The estimated ejection fraction was in the range of 50% to 55%. Wall motion was normal; there were no regional wall motion abnormalities. Doppler parameters are consistent with abnormal left ventricular relaxation (grade 1 diastolic dysfunction). - Aortic valve: Transvalvular velocity was within the normal range. There was no stenosis. There was no regurgitation. - Mitral valve: There was no regurgitation. - Right ventricle: The cavity size was normal.  Wall thickness was normal. Systolic function was normal. - Tricuspid valve: There was no regurgitation. - Inferior vena cava: The vessel was normal in size. The respirophasic diameter changes were in the normal range (>= 50%), consistent with normal central venous pressure.  ASSESSMENT & PLAN:    62 yo man with   1) h/o previous Stage IVB E Mantle Cell lymphoma with likely gastric involvement, extensive LNadenopathy. ECHO nl EF ECOG PS 2 but activities significantly limited due to being legally blind HIV/Hep C/Hep B neg PET/CT scan after 3 cycles of R CHOP show good overall response. Stomach lesion still with very FDG uptake ? Residual lymphoma versus  inflammation.  Patient is status post 6 cycles of R CHOP. PET/CT after 6 cycles shows resolution of hypermetabolic lymphadenopathy and gastric wall activity . Patient was on maintenance Rituxan for 1 year then relapsed. 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 CT chest abdomen pelvis showed multiple areas of lymphadenopathy in the abdomen as well as in the chest suggesting significant recurrence. PET/CT scan 03/25/2017 shows diffuse involvement with mantle cell lymphoma involving the chest abdomen pelvis and bowels. PET/ct 06/19/2017-- showed significant partial response to BR treatment PET/CT 10/25/2017 - with significant mantle cell lymphoma progression again.  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 Lab Results  Component Value Date   LDH 331 (H) 10/26/2017    4) Rectal urgency due to mantle cell lymphoma causing mass in rectum/sigmoid 5) malignant pleural effusion and b/l pleural involvement. Also with ? Parapneumonia infection - culture growing out Enterococcus faecalis. ? True infection vs contaminant. (Body fluid cell diff - 90% lymphocytic consistent with lymphoma as opposed to empyema at  this time) 6) Shortness related to symptomatic pleural effusion. PLAN -no overt evidence of TLS -no overt toxicities from Ibrutinib at this time -rpt body fluid cell count still with lymphocytic predominance and rpt body fluid cultures NGTD -continue Ibrutinib 560 mg p.o. Daily.   -would monitor for the next couple of day if pleural effusion not rapidly returning could consider a prn thoracentesis approach till the Ibrutinib kick in progressively. If very rapid return of pleural effusion would ned a pleurex cath for interim drainage at home. -appreciate care from hospital medicine team. -watch for bleeding issues and cardiac arrhythmias  7) Retinitis pigmentosa causing legal blindness -  is seeing a retina specialist in town. No acute interventions at this time. Notes that his vision is progressively getting worse. His daughter and granddaughter are helping him out at home.   8) Weight loss and protein calorie malnutrition.  Wt Readings from Last 3 Encounters:  10/25/17 182 lb 12.2 oz (82.9 kg)  09/17/17 204 lb 3.2 oz (92.6 kg)  08/06/17 203 lb 8 oz (92.3 kg)  -nutritional support - dietician input.  I spent 20 minutes counseling the patient face to face. The total time spent in the appointment was 25 minutes and more than 50% was on counseling and direct patient cares.  Sullivan Lone MD [place] AAHIVMS [facility] Hematology/Oncology Physician [facility] ([facility])  (Office):       [phone] (Work cell):  [phone] (Fax):           [phone]

## 2017-10-31 DIAGNOSIS — E43 Unspecified severe protein-calorie malnutrition: Secondary | ICD-10-CM

## 2017-10-31 LAB — COMPREHENSIVE METABOLIC PANEL
ALT: 26 U/L (ref 17–63)
AST: 13 U/L — ABNORMAL LOW (ref 15–41)
Albumin: 2.4 g/dL — ABNORMAL LOW (ref 3.5–5.0)
Alkaline Phosphatase: 66 U/L (ref 38–126)
Anion gap: 7 (ref 5–15)
BUN: 14 mg/dL (ref 6–20)
CHLORIDE: 100 mmol/L — AB (ref 101–111)
CO2: 30 mmol/L (ref 22–32)
CREATININE: 0.73 mg/dL (ref 0.61–1.24)
Calcium: 8.5 mg/dL — ABNORMAL LOW (ref 8.9–10.3)
Glucose, Bld: 249 mg/dL — ABNORMAL HIGH (ref 65–99)
POTASSIUM: 4 mmol/L (ref 3.5–5.1)
SODIUM: 137 mmol/L (ref 135–145)
Total Bilirubin: 0.5 mg/dL (ref 0.3–1.2)
Total Protein: 5.2 g/dL — ABNORMAL LOW (ref 6.5–8.1)

## 2017-10-31 LAB — PHOSPHORUS: PHOSPHORUS: 3.1 mg/dL (ref 2.5–4.6)

## 2017-10-31 LAB — CBC WITH DIFFERENTIAL/PLATELET
BASOS ABS: 0 10*3/uL (ref 0.0–0.1)
Basophils Relative: 0 %
EOS ABS: 0.1 10*3/uL (ref 0.0–0.7)
EOS PCT: 0 %
HCT: 36.2 % — ABNORMAL LOW (ref 39.0–52.0)
Hemoglobin: 11.8 g/dL — ABNORMAL LOW (ref 13.0–17.0)
LYMPHS ABS: 0.7 10*3/uL (ref 0.7–4.0)
Lymphocytes Relative: 4 %
MCH: 29.4 pg (ref 26.0–34.0)
MCHC: 32.6 g/dL (ref 30.0–36.0)
MCV: 90 fL (ref 78.0–100.0)
Monocytes Absolute: 0.4 10*3/uL (ref 0.1–1.0)
Monocytes Relative: 3 %
Neutro Abs: 16.2 10*3/uL — ABNORMAL HIGH (ref 1.7–7.7)
Neutrophils Relative %: 93 %
PLATELETS: 245 10*3/uL (ref 150–400)
RBC: 4.02 MIL/uL — AB (ref 4.22–5.81)
RDW: 13.9 % (ref 11.5–15.5)
WBC: 17.5 10*3/uL — AB (ref 4.0–10.5)

## 2017-10-31 LAB — URIC ACID: URIC ACID, SERUM: 4.4 mg/dL (ref 4.4–7.6)

## 2017-10-31 NOTE — Progress Notes (Signed)
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 Patient chooses not to pursue AutoHSCT based strategies which is quite reasonable considering his functional challenges with legal blindness and limited social support.   S/p 6 cycles of Bendamustine/Rituxan    INTERVAL HISTORY  Patient notes no acute new concerns since yesterday. No fevers/chills/night sweats. Overall feeling well. No acute new issues. No acute new toxicities from Ibrutinib. Labs stable. No evidence of TLS. No bleeding or bruising. Uric acid stable.  No acute new symptoms  PAST MEDICAL HISTORY:  Past Medical History:  Diagnosis Date  . Abscess of arm, right 10/27/2015  . Lymphoma of lymph nodes of multiple sites ([place]) 10/27/2015  . 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 Shortness of breath 10/27/2015    SURGICAL HISTORY: Past Surgical History:  Procedure Laterality Date  . Cataract surgery     Patient notes she has had bilateral Surgery in 1998 and 99  . FLEXIBLE SIGMOIDOSCOPY N/A 03/01/2017   Procedure: FLEXIBLE SIGMOIDOSCOPY;  Surgeon: Manus Gunning, MD;  Location: [facility];  Service: Gastroenterology;  Laterality: N/A;  . TONSILLECTOMY     about 62years old    SOCIAL HISTORY: Social History   Socioeconomic History  . Marital status: Widowed    Spouse name: Not on file  . Number of children: Not on file  . Years of education: Not on file  . Highest education level: Not on  file  Social Needs  . Financial resource strain: Not on file  . Food insecurity - worry: Not on file  . Food insecurity - inability: Not on file  . Transportation needs - medical: Not on file  . Transportation needs - non-medical: Not on file  Occupational History  . Not on file  Tobacco Use  . Smoking status: Current Every Day Smoker    Packs/day: 1.00    Years: 33.00    Pack years: 33.00  . Smokeless tobacco: Never Used  Substance and Sexual Activity  . Alcohol use: No  . Drug use: No  . Sexual activity: Not on file    Comment: Disabled, retinis pigmentosa legally blind, 4children MPOA Lynnn(cousin)  Other Topics Concern  . Not on file  Social History Narrative  . Not on file    FAMILY HISTORY: Family History  Problem Relation Age of Onset  . Colon cancer Neg Hx     ALLERGIES:  has No Known Allergies.  MEDICATIONS:  Current Facility-Administered Medications  Medication Dose Route Frequency Provider Last Rate Last Dose  . acetaminophen (TYLENOL) tablet 650 mg  650 mg Oral Q6H PRN Dhungel, Nishant, MD       Or  . acetaminophen (TYLENOL) suppository 650 mg  650 mg Rectal Q6H PRN Dhungel, Nishant, MD      . ampicillin (OMNIPEN) 2 g in sodium chloride 0.9 % 50 mL IVPB  2 g Intravenous Q4H Theodis Blaze, MD 150 mL/hr at 10/31/17 0948 2 g at 10/31/17 0948  . dexamethasone (DECADRON) tablet 4 mg  4 mg Oral  Q12H Theodis Blaze, MD   4 mg at 10/31/17 1041  . enoxaparin (LOVENOX) injection 40 mg  40 mg Subcutaneous Q24H Dhungel, Nishant, MD      . Ibrutinib TABS 560 mg  560 mg Oral Q breakfast Theodis Blaze, MD   560 mg at 10/31/17 1042  . multivitamin with minerals tablet 1 tablet  1 tablet Oral Daily Theodis Blaze, MD   1 tablet at 10/31/17 1040  . ondansetron (ZOFRAN) tablet 4 mg  4 mg Oral Q6H PRN Dhungel, Nishant, MD       Or  . ondansetron (ZOFRAN) injection 4 mg  4 mg Intravenous Q6H PRN Dhungel, Nishant, MD      . oxyCODONE (Oxy IR/ROXICODONE) immediate release  tablet 5 mg  5 mg Oral Q4H PRN Theodis Blaze, MD      . protein supplement [facility] CHICKEN SOUP) powder 8 oz  8 oz Oral BID BM Theodis Blaze, MD   8 oz at 10/28/17 1619  . sodium chloride flush (NS) 0.9 % injection 10-40 mL  10-40 mL Intracatheter Q12H Theodis Blaze, MD   10 mL at 10/29/17 2130    REVIEW OF SYSTEMS:    A 10+ POINT REVIEW OF SYSTEMS WAS OBTAINED including neurology, dermatology, psychiatry, cardiac, respiratory, lymph, extremities, GI, GU, Musculoskeletal, constitutional, breasts, reproductive, HEENT.  All pertinent positives are noted in the HPI.  All others are negative.  PHYSICAL EXAMINATION:  ECOG PERFORMANCE STATUS: 2 - Symptomatic, <50% confined to bed .VS as noted in [place], in no acute distress and comfortable SKIN: some nodular skin lesions over extremities -much improved OROPHARYNX:no exudate, no erythema and lips, multiple carious teeth with significant periodontal disease. NECK: supple, no JVD, thyroid normal size, non-tender, without nodularity LYMPH:  no palpable lymphadenopathy in the cervical, axillary or inguinal LUNGS: clear to auscultation with normal respiratory effort HEART: regular rate & rhythm,  no murmurs and no lower extremity edema ABDOMEN: abdomen soft, non-tender, normoactive bowel sounds  Musculoskeletal: no cyanosis of digits and no clubbing  PSYCH: alert & oriented x 3 with fluent speech NEURO: no focal motor/sensory deficits  LABORATORY DATA:  I have reviewed the data as listed   CBC Latest Ref Rng & Units 10/31/2017 10/30/2017 10/28/2017  WBC 4.0 - 10.5 K/uL 17.5(H) 18.3(H) 16.7(H)  Hemoglobin 13.0 - 17.0 g/dL 11.8(L) 12.3(L) 11.4(L)  Hematocrit 39.0 - 52.0 % 36.2(L) 38.1(L) 36.1(L)  Platelets 150 - 400 K/uL 245 275 290    . CMP Latest Ref Rng & Units 10/31/2017 10/30/2017 10/28/2017  Glucose 65 - 99 mg/dL 249(H) 239(H) 204(H)  BUN 6 - 20 mg/dL 14 13 13   Creatinine 0.61 - 1.24 mg/dL 0.73 0.80 0.68  Sodium 135 - 145  mmol/L 137 138 139  Potassium 3.5 - 5.1 mmol/L 4.0 4.0 4.2  Chloride 101 - 111 mmol/L 100(L) 100(L) 107  CO2 22 - 32 mmol/L 30 29 25   Calcium 8.9 - 10.3 mg/dL 8.5(L) 8.6(L) 9.1  Total Protein 6.5 - 8.1 g/dL 5.2(L) 5.0(L) -  Total Bilirubin 0.3 - 1.2 mg/dL 0.5 0.4 -  Alkaline Phos 38 - 126 U/L 66 67 -  AST 15 - 41 U/L 13(L) 19 -  ALT 17 - 63 U/L 26 32 -   Component     Latest Ref Rng & Units 10/31/2017  Uric Acid, Serum     4.4 - 7.6 mg/dL 4.4  Phosphorus     2.5 - 4.6 mg/dL 3.1  RADIOGRAPHIC STUDIES: I have personally reviewed the radiological images as listed and agreed with the findings in the report. Dg Chest 1 View  Result Date: 10/29/2017 CLINICAL DATA:  Status post left thoracentesis. EXAM: CHEST 1 VIEW COMPARISON:  10/27/2017 FINDINGS: Right chest wall port a catheter noted with tip at the cavoatrial junction. There has been interval decrease in volume of the right pleural effusion from previous exam. The left pleural effusion is also decreased in the interval. No left-sided pneumothorax status post thoracentesis. Resolution of previous interstitial edema. IMPRESSION: 1. No pneumothorax following left-sided thoracentesis. Electronically Signed   By: Kerby Moors M.D.   On: 10/29/2017 16:22   Dg Chest 1 View  Result Date: 10/25/2017 CLINICAL DATA:  Left pleural effusion.  Status post thoracentesis. EXAM: CHEST 1 VIEW 1 p.m. COMPARISON:  10/25/2017 8:51 a.m. and PET-CT dated 06/19/2017 FINDINGS: There has been reduction in the size of the large left pleural effusion. Moderate effusion persists. No pneumothorax. There is abnormal soft tissue density at the right lung base medially which could represent a mass. There is slight focal pleural thickening laterally at the right lung base which is felt represent loculated fluid. Heart size and vascularity appear normal.  Power port in place. IMPRESSION: 1. No pneumothorax after left thoracentesis. 2. Moderate residual left effusion.  3. Abnormal soft tissue density at the right base medially which could represent a mass. This was not present on the PET-CT dated 06/19/2017. Electronically Signed   By: Lorriane Shire M.D.   On: 10/25/2017 13:16   Dg Chest 2 View  Result Date: 10/27/2017 CLINICAL DATA:  62 year old male with shortness of breath and cough. Stage IV mantle cell lymphoma. EXAM: CHEST  2 VIEW COMPARISON:  PET-CT 10/25/2017, and earlier. FINDINGS: Right chest porta cath, accessed. Moderate-sized left and small right pleural effusions persist and are associated with bulky pleural nodularity (lateral view) corresponding to the recent hypermetabolic pleural disease on PET. The mediastinal contour is more normal. Visualized tracheal air column is within normal limits. No cardiomegaly. No pneumothorax or pulmonary edema. No acute osseous abnormality identified radiographically. Paucity of bowel gas in the upper abdomen. IMPRESSION: 1. Bulky bilateral pleural tumor with moderate left and small right pleural effusions redemonstrated. See also report of the PET-CT 10/25/2017. 2. No new cardiopulmonary abnormality. Electronically Signed   By: Genevie Ann M.D.   On: 10/27/2017 15:31   Nm Pet Image Restag (ps) Skull Base To Thigh  Result Date: 10/25/2017 CLINICAL DATA:  Subsequent treatment strategy for stage IV mantle cell lymphoma. EXAM: NUCLEAR MEDICINE PET SKULL BASE TO THIGH TECHNIQUE: 10.2 mCi F-18 FDG was injected intravenously. Full-ring PET imaging was performed from the skull base to thigh after the radiotracer. CT data was obtained and used for attenuation correction and anatomic localization. FASTING BLOOD GLUCOSE:  Value: 103 mg/dl COMPARISON:  06/19/2017 FINDINGS: NECK: No hypermetabolic lymph nodes in the neck. CHEST: Marked interval progression of disease in the chest. There is bulky right axillary and subpectoral lymphadenopathy. Hypermetabolic lymphadenopathy identified in the left thoracic inlet, mediastinum, and hilar  regions. Interval development of bulky pleural disease bilaterally. Index right axillary lymph node (image 55 series 4) measures 2.2 cm with SUV max = 12.8. Anterior left pleural mass versus markedly enlarged internal mammary lymph node measuring 3.7 cm (image 63 series 4) demonstrates SUV max = 15.2. 6.0 x 3.2 cm mass lesion in the medial right lung base, adjacent to the heart is probably the same lesion measured on the abdomen and pelvis  CT of 02/28/2017 at 2.8 x 1.9 cm. This is markedly hypermetabolic with SUV max = 60.1. Hypermetabolic left anterior pleural lesion extends into the chest wall on image 84 series 4. Tiny right and moderate left pleural effusions are evident. Right Port-A-Cath tip is in the distal SVC. ABDOMEN/PELVIS: There is omental and/or colonic wall nodular hypermetabolism. 1.4 x 2.0 cm nodule contiguous with the wall of the hepatic flexure (image 139 series 4) is hypermetabolic with SUV max = 09.3. Similar nodule along the distal transverse colon (image 144 series 4) is hypermetabolic. Hypermetabolic lymph node posterior to the stomach is identified on image 141. A large 3.2 x 2.7 cm soft tissue lesion anterior to the right psoas muscle on image 148 of series 4 demonstrates SUV max = 18.5. Other scattered hypermetabolic lymphadenopathy is seen in the retroperitoneum and pelvic sidewall. Marked interval progression of the distal sigmoid/rectal mass with appearance now similar back to the study from 02/28/2017. Atherosclerotic calcification noted in the abdominal aorta. No evidence for bowel obstruction. Large bilateral groin hernias contain small bowel loops without complicating features. SKELETON: Hypermetabolic nodules are seen in the subcutaneous fat of the anterior abdominal wall. There is a hypermetabolic lesion in the upper left rectus musculature (image 103 series 4). Other scattered hypermetabolic nodules are seen in the musculoskeletal anatomy. A 19 mm nodule is seen in the quadriceps  musculature of the proximal left thigh. IMPRESSION: 1. Marked interval progression of hypermetabolic disease in the chest, abdomen, and pelvis. There is hypermetabolic lymphadenopathy in the chest with very marked bilateral pleural disease including a left anterior pleural-based lesion that invades the chest wall. Hypermetabolic lymphadenopathy is seen in the abdomen/pelvis with multiple hypermetabolic lesions along the surface of the colon and the distal sigmoid/rectal lesion has progressed substantially in the interval and is now similar in appearance to the previous CT scan of 02/28/2017. Diffuse subcutaneous and musculoskeletal soft tissue hypermetabolic lesions are associated. 2. Tiny right and moderate left pleural effusions. 3.  Aortic Atherosclerois (ICD10-170.0) 4. Large bilateral groin hernias contain small bowel without complicating features. Electronically Signed   By: Misty Stanley M.D.   On: 10/25/2017 16:04   Dg Chest Port 1 View  Result Date: 10/25/2017 CLINICAL DATA:  Shortness of breath.  History of lymphoma EXAM: PORTABLE CHEST 1 VIEW COMPARISON:  PET-CT June 19, 2017. FINDINGS: Port-A-Cath tip is in superior vena cava. Humor needle is placed in the port. No pneumothorax. There is a large left pleural effusion with consolidation left mid lower lung zones. The right lung is clear. Heart appears mildly enlarged. Visualized pulmonary vascularity is normal. No adenopathy evident in regions that can be assessed. No bone lesions. IMPRESSION: Sizable pleural effusion with left mid and lower lung zone consolidation. Right lung clear. Heart appears prominent. Port-A-Cath tip in superior vena cava. No pneumothorax. Electronically Signed   By: Lowella Grip III M.D.   On: 10/25/2017 09:25   US Thoracentesis Asp Pleural Space W/img Guide  Result Date: 10/30/2017 INDICATION: Patient with history of stage IV mantle cell lymphoma with recurrent malignant left pleural effusion, dyspnea, pleural  fluid with Enterococcus in recent cultures. Request made for diagnostic and therapeutic left thoracentesis. EXAM: ULTRASOUND GUIDED DIAGNOSTIC AND THERAPEUTIC LEFT THORACENTESIS MEDICATIONS: None. COMPLICATIONS: None immediate. PROCEDURE: An ultrasound guided thoracentesis was thoroughly discussed with the patient and questions answered. The benefits, risks, alternatives and complications were also discussed. The patient understands and wishes to proceed with the procedure. Written consent was obtained. Ultrasound was performed to localize and  mark an adequate pocket of fluid in the left chest. The area was then prepped and draped in the normal sterile fashion. 1% Lidocaine was used for local anesthesia. Under ultrasound guidance a 6 Fr Safe-T-Centesis catheter was introduced. Thoracentesis was performed. The catheter was removed and a dressing applied. FINDINGS: A total of approximately 2.4 liters of hazy, amber fluid was removed. Samples were sent to the laboratory as requested by the clinical team. IMPRESSION: Successful ultrasound guided diagnostic and therapeutic left thoracentesis yielding 2.4 liters of pleural fluid. Read by: Rowe Roscoe, PA-C Electronically Signed   By: Sandi Mariscal M.D.   On: 10/29/2017 15:48   US Thoracentesis Asp Pleural Space W/img Guide  Result Date: 10/25/2017 INDICATION: Mantle cell lymphoma with a left-sided pleural effusion. Request is made for diagnostic and therapeutic thoracentesis. EXAM: ULTRASOUND GUIDED DIAGNOSTIC AND THERAPEUTIC THORACENTESIS MEDICATIONS: 1% lidocaine COMPLICATIONS: None immediate. PROCEDURE: An ultrasound guided thoracentesis was thoroughly discussed with the patient and questions answered. The benefits, risks, alternatives and complications were also discussed. The patient understands and wishes to proceed with the procedure. Written consent was obtained. Ultrasound was performed to localize and mark an adequate pocket of fluid in the left chest. The  area was then prepped and draped in the normal sterile fashion. 1% Lidocaine was used for local anesthesia. Under ultrasound guidance a Safe-T-Centesis catheter was introduced. Thoracentesis was performed. The catheter was removed and a dressing applied. FINDINGS: A total of approximately 2 L of amber fluid was removed. Samples were sent to the laboratory as requested by the clinical team. IMPRESSION: Successful ultrasound guided left thoracentesis yielding 2 L of pleural fluid. The procedure was terminated at this time secondary to his first time maximum of 2 L. Read by: Saverio Danker, PA-C Electronically Signed   By: Markus Daft M.D.   On: 10/25/2017 13:17    ECHO: 11/01/2015: Study Conclusions  - Left ventricle: The cavity size was normal. There was mild concentric hypertrophy. Systolic function was normal. The estimated ejection fraction was in the range of 50% to 55%. Wall motion was normal; there were no regional wall motion abnormalities. Doppler parameters are consistent with abnormal left ventricular relaxation (grade 1 diastolic dysfunction). - Aortic valve: Transvalvular velocity was within the normal range. There was no stenosis. There was no regurgitation. - Mitral valve: There was no regurgitation. - Right ventricle: The cavity size was normal. Wall thickness was normal. Systolic function was normal. - Tricuspid valve: There was no regurgitation. - Inferior vena cava: The vessel was normal in size. The respirophasic diameter changes were in the normal range (>= 50%), consistent with normal central venous pressure.Study Conclusions  - Left ventricle: The cavity size was normal. There was mild concentric hypertrophy. Systolic function was normal. The estimated ejection fraction was in the range of 50% to 55%. Wall motion was normal; there were no regional wall motion abnormalities. Doppler parameters are consistent with abnormal left ventricular  relaxation (grade 1 diastolic dysfunction). - Aortic valve: Transvalvular velocity was within the normal range. There was no stenosis. There was no regurgitation. - Mitral valve: There was no regurgitation. - Right ventricle: The cavity size was normal. Wall thickness was normal. Systolic function was normal. - Tricuspid valve: There was no regurgitation. - Inferior vena cava: The vessel was normal in size. The respirophasic diameter changes were in the normal range (>= 50%), consistent with normal central venous pressure.  ASSESSMENT & PLAN:    62 yo man with   1) h/o previous Stage IVB  E Mantle Cell lymphoma with likely gastric involvement, extensive LNadenopathy. ECHO nl EF ECOG PS 2 but activities significantly limited due to being legally blind HIV/Hep C/Hep B neg PET/CT scan after 3 cycles of R CHOP show good overall response. Stomach lesion still with very FDG uptake ? Residual lymphoma versus inflammation.  Patient is status post 6 cycles of R CHOP. PET/CT after 6 cycles shows resolution of hypermetabolic lymphadenopathy and gastric wall activity . Patient was on maintenance Rituxan for 1 year then relapsed. 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 CT chest abdomen pelvis showed multiple areas of lymphadenopathy in the abdomen as well as in the chest suggesting significant recurrence. PET/CT scan 03/25/2017 shows diffuse involvement with mantle cell lymphoma involving the chest abdomen pelvis and bowels. PET/ct 06/19/2017-- showed significant partial response to BR treatment PET/CT 10/25/2017 - with significant mantle cell lymphoma progression again.  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 Lab Results  Component Value Date   LDH 331 (H) 10/26/2017    4) Rectal urgency due to mantle cell lymphoma causing mass in rectum/sigmoid 5)  malignant pleural effusion and b/l pleural involvement. Also with ? Parapneumonia infection - culture growing out Enterococcus faecalis. ? True infection vs contaminant. (Body fluid cell diff - 90% lymphocytic consistent with lymphoma as opposed to empyema at this time) rpt body fluid cell count still with lymphocytic predominance and rpt body fluid cultures NGTD 6) Shortness related to symptomatic pleural effusion. PLAN -no overt evidence of TLS -no overt toxicities from Ibrutinib at this time -continue Ibrutinib 560 mg p.o. Daily.   -would consider CXR tomorrow AM. If significant pleural fluid - could perform one additional thoracentesis prior to discharge without placing pleurex catheter. -could then be discharged on po abx to complete as outpatient. --patient has f/u with me with labs on 11/05/2017 -appreciate care from hospital medicine team.  7) Retinitis pigmentosa causing legal blindness -  is seeing a retina specialist in town. No acute interventions at this time. Notes that his vision is progressively getting worse. His daughter and granddaughter are helping him out at home.  I spent 25 minutes counseling the patient face to face. The total time spent in the appointment was 35 minutes and more than 50% was on counseling and direct patient cares and co-ordination of cares.  Sullivan Lone MD [place] AAHIVMS [facility] Hematology/Oncology Physician [facility]  (Office):       [phone] (Work cell):  [phone] (Fax):           [phone]

## 2017-10-31 NOTE — Progress Notes (Signed)
Patient ID: FRANKI STEMEN, male   DOB: 04/10/1956, 62 y.o.   MRN: [id number]    PROGRESS NOTE  JAAZIEL PEATROSS  [id number]:[id number] DOB: Aug 12, 1956 DOA: 10/25/2017  PCP: Patient, No Pcp Per   Brief Narrative:  62 y.o. male, with stage IV mantle cell lymphoma diagnosed in 10/2015, status post R-CHOP 6 cycles followed by maintenance Rituxan, awaiting PET scan, retinitis pigmentosa with legal blindness was sent to the ED from PET scan after pt had increasing shortness of breath.  Course in the ED neck line patient was tachycardic low 100s, tachypneic, O2 sat dropped to 80% on room air, afebrile. Chest x-ray showed large left-sided pleural effusion with possibleconsolidation. 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  - s/p left thoracentesis 1/17, 2 L fluid removed, fluid with enterococcus faecalis, sensitive to vanc and ampicillin - suspect this is related to ongoing pleural effusion and bulky pleural tumor as noted on recent PET scan, confirmed with CXR - changed vancomycin to ampicillin 01/22 - based on clinical status it appears that this is now re accumulating fluid and certainly worrisome for underlying empyema however, body fluid cell diff with 90% lymphocytes and thus consistent with lymphoma as opposed to empyema - pt had second thoracentesis on 01/22 and 2.4 L fluid removed , fluids analysis remain lymphocyte dominant - pt now on Ibrutinib 500 mg PO QD -repeat cxr in am if fluid re accumulates rapidly again, can repeat tap. He can discharged on abx and follow up with Dr Irene Limbo on 1/29 to discuss wether pleurex cath placement  Will be needed or not, Dr Irene Limbo wants to give ibrutinib some time to see if it help reduced the pleural effusion. -  Case discussed with oncology Dr Irene Limbo over the  phone.  Active Problems:   Mantle cell lymphoma of lymph nodes of multiple regions [facility]) - follows with Dr. Irene Limbo, appreciate his assistance  - management per Dr. 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 Irene Limbo, also recommended stool softeners, pt now with diarrhea, will hold off on stool softeners to see how pt does     Diarrhea - hold stool softeners for now - low threshold for testing for C. Diff if WBC up and pt's clinically worse     Protein-calorie malnutrition ([place]) - in the setting of malignancy  - tolerating diet so far   DVT prophylaxis: Lovenox  Code Status: Full  Family Communication: pt at bedside  Disposition Plan: home, hopfullin on 1/25  Consultants:   IR  Dr. Irene Limbo Oncologist   Procedures:   Left thoracentesis on 1/18 and 1/22  Antimicrobials:   Vancomycin 10/26/2017 --> 01/22  Ampicillin 01/22 -->  Subjective: Feeling better, wonder when he can go home He denies pain, no sob, no cough, no fever, no hypoxia, no edema.  Objective: Vitals:   10/30/17 0549 10/30/17 1318 10/30/17 2025 10/31/17 0424  BP: 114/74 125/63 (!) 132/55 120/70  Pulse: 87 93 93 73  Resp: 18 18 18 18   Temp: 98.3 F (36.8 C) 97.6  F (36.4 C) 98 F (36.7 C) 97.9 F (36.6 C)  TempSrc: Oral Oral Oral Oral  SpO2: 94% 93% 95% 93%  Weight:      Height:        Intake/Output Summary (Last 24 hours) at 10/31/2017 0815 Last data filed at 10/31/2017 0600 Gross per 24 hour  Intake 900 ml  Output 625 ml  Net 275 ml   Filed Weights   10/25/17 0838 10/25/17 1631  Weight: 92.5 kg (204 lb) 82.9 kg (182 lb 12.2 oz)   Physical Exam  Constitutional: Appears calm, NAD CVS: RRR, S1/S2 +, no murmurs, no gallops, no carotid bruit.  Pulmonary:  Effort and breath sounds normal, still diminished air movement R > L side  Abdominal: Soft. BS +,  no distension, tenderness, rebound or guarding.  Musculoskeletal: Normal range of motion. No edema and no tenderness.  Neuro: Alert. Normal reflexes, muscle tone coordination. No cranial nerve deficit.  Data Reviewed: I have personally reviewed following labs and imaging studies  CBC: Recent Labs  Lab 10/25/17 0844  10/26/17 0441 10/27/17 0404 10/28/17 0500 10/30/17 0402 10/31/17 0416  WBC 17.4*  --  14.3* 16.1* 16.7* 18.3* 17.5*  NEUTROABS 15.2*  --   --   --   --   --  16.2*  HGB 13.2   < > 12.2* 11.7* 11.4* 12.3* 11.8*  HCT 40.8   < > 38.1* 37.1* 36.1* 38.1* 36.2*  MCV 91.9  --  92.7 91.6 92.1 90.9 90.0  PLT 352  --  305 290 290 275 245   < > = values in this interval not displayed.   Basic Metabolic Panel: Recent Labs  Lab 10/26/17 0441 10/27/17 0404 10/28/17 0500 10/30/17 0402 10/31/17 0416  NA 138 138 139 138 137  K 3.5 3.5 4.2 4.0 4.0  CL 102 105 107 100* 100*  CO2 27 26 25 29 30   GLUCOSE 160* 182* 204* 239* 249*  BUN 12 11 13 13 14   CREATININE 0.92 0.79 0.68 0.80 0.73  CALCIUM 8.7* 8.8* 9.1 8.6* 8.5*  PHOS 2.8  --   --   --  3.1   Liver Function Tests: Recent Labs  Lab 10/25/17 0844 10/30/17 0402 10/31/17 0416  AST 27 19 13*  ALT 18 32 26  ALKPHOS 79 67 66  BILITOT 1.4* 0.4 0.5  PROT 6.9 5.0* 5.2*  ALBUMIN 3.1* 2.4* 2.4*   Coagulation Profile: Recent Labs  Lab 10/25/17 0844  INR 1.02   CBG: Recent Labs  Lab 10/25/17 0834 10/25/17 1339  GLUCAP 164* 103*   Urine analysis:    Component Value Date/Time   COLORURINE YELLOW 10/24/2015 1705   APPEARANCEUR CLOUDY (A) 10/24/2015 1705   LABSPEC 1.024 10/24/2015 1705   PHURINE 5.0 10/24/2015 1705   GLUCOSEU NEGATIVE 10/24/2015 1705   HGBUR NEGATIVE 10/24/2015 1705   [place] 10/24/2015 [place] 10/24/2015 1705   PROTEINUR 30 (A) 10/24/2015 1705   NITRITE NEGATIVE  10/24/2015 1705   LEUKOCYTESUR NEGATIVE 10/24/2015 1705   Recent Results (from the past 240 hour(s))  Fungus Culture With Stain     Status: None (Preliminary result)   Collection Time: 10/25/17 12:33 PM  Result Value Ref Range Status   Fungus Stain Final report  Final    Comment: (NOTE) Performed At: [facility] [place], [place] [id number] Rush Farmer MD WU:[id number]    Fungus (Mycology) Culture PENDING  Incomplete   Fungal Source PLEURAL  Final  Culture,  body fluid-bottle     Status: Abnormal   Collection Time: 10/25/17 12:33 PM  Result Value Ref Range Status   Specimen Description FLUID PLEURAL  Final   Special Requests NONE  Final   Gram Stain   Final    GRAM POSITIVE COCCI ANAEROBIC BOTTLE ONLY CRITICAL RESULT CALLED TO, READ BACK BY AND VERIFIED WITH: Bettina Gavia RN, AT [phone] 10/26/17 BY Rush Landmark Performed at [facility], [place] [address], [place], [place] [address]    Culture ENTEROCOCCUS FAECALIS (A)  Final   Report Status 10/28/2017 FINAL  Final   Organism ID, Bacteria ENTEROCOCCUS FAECALIS  Final      Susceptibility   Enterococcus faecalis - MIC*    AMPICILLIN <=2 SENSITIVE Sensitive     VANCOMYCIN 1 SENSITIVE Sensitive     GENTAMICIN SYNERGY SENSITIVE Sensitive     * ENTEROCOCCUS FAECALIS  Gram stain     Status: None   Collection Time: 10/25/17 12:33 PM  Result Value Ref Range Status   Specimen Description FLUID  Final   Special Requests NONE  Final   Gram Stain   Final    ABUNDANT WBC PRESENT,BOTH PMN AND MONONUCLEAR NO ORGANISMS SEEN Performed at [facility], [place] [address] [place], [place] [address]    Report Status 10/25/2017 FINAL  Final  Fungus Culture Result     Status: None   Collection Time: 10/25/17 12:33 PM  Result Value Ref Range Status   Result 1 Comment  Final    Comment: (NOTE) KOH/Calcofluor preparation:  no fungus observed. Performed At: [facility] [place], [place]  [id number] Rush Farmer MD SJ:[id number]   Body fluid culture     Status: None (Preliminary result)   Collection Time: 10/29/17  3:58 PM  Result Value Ref Range Status   Specimen Description PLEURAL  Final   Special Requests NONE  Final   Gram Stain   Final    MODERATE WBC PRESENT, PREDOMINANTLY MONONUCLEAR NO ORGANISMS SEEN    Culture   Final    NO GROWTH 2 DAYS Performed at [facility], [address], [place], [place] [phone]    Report Status PENDING  Incomplete    Radiology Studies: Dg Chest 2 View Result Date: 10/27/2017 1. Bulky bilateral pleural tumor with moderate left and small right pleural effusions redemonstrated. See also report of the PET-CT 10/25/2017.  2. No new cardiopulmonary abnormality.   Scheduled Meds: . dexamethasone  4 mg Oral Q12H  . enoxaparin (LOVENOX) injection  40 mg Subcutaneous Q24H  . Ibrutinib  560 mg Oral Q breakfast  . multivitamin with minerals  1 tablet Oral Daily  . protein supplement  8 oz Oral BID BM  . sodium chloride flush  10-40 mL Intracatheter Q12H   Continuous Infusions: . ampicillin (OMNIPEN) IV Stopped (10/31/17 0354)     LOS: 6 days   Time spent: 25 minutes   Florencia Reasons, MD PhD [facility] Pager [phone]  If 7PM-7AM, please contact night-coverage [email or web address] Password [facility] 10/31/2017, 8:15 AM

## 2017-11-01 ENCOUNTER — Inpatient Hospital Stay (HOSPITAL_COMMUNITY): Payer: Medicare Other

## 2017-11-01 LAB — COMPREHENSIVE METABOLIC PANEL
ALT: 27 U/L (ref 17–63)
ANION GAP: 7 (ref 5–15)
AST: 17 U/L (ref 15–41)
Albumin: 2.5 g/dL — ABNORMAL LOW (ref 3.5–5.0)
Alkaline Phosphatase: 65 U/L (ref 38–126)
BILIRUBIN TOTAL: 0.3 mg/dL (ref 0.3–1.2)
BUN: 13 mg/dL (ref 6–20)
CO2: 30 mmol/L (ref 22–32)
Calcium: 8.7 mg/dL — ABNORMAL LOW (ref 8.9–10.3)
Chloride: 99 mmol/L — ABNORMAL LOW (ref 101–111)
Creatinine, Ser: 0.77 mg/dL (ref 0.61–1.24)
Glucose, Bld: 256 mg/dL — ABNORMAL HIGH (ref 65–99)
POTASSIUM: 4.2 mmol/L (ref 3.5–5.1)
Sodium: 136 mmol/L (ref 135–145)
TOTAL PROTEIN: 5.5 g/dL — AB (ref 6.5–8.1)

## 2017-11-01 LAB — URIC ACID: URIC ACID, SERUM: 4.2 mg/dL — AB (ref 4.4–7.6)

## 2017-11-01 LAB — PHOSPHORUS: PHOSPHORUS: 3 mg/dL (ref 2.5–4.6)

## 2017-11-01 MED ORDER — HEPARIN SOD (PORK) LOCK FLUSH 100 UNIT/ML IV SOLN
500.0000 [IU] | INTRAVENOUS | Status: AC | PRN
  Administered 2017-11-01: 500 [IU]

## 2017-11-01 MED ORDER — ALLOPURINOL 100 MG PO TABS: 100.0000 mg | ORAL_TABLET | Freq: Two times a day (BID) | ORAL | 0 refills | Status: DC

## 2017-11-01 MED ORDER — SODIUM CHLORIDE 0.9% FLUSH
10.0000 mL | INTRAVENOUS | Status: DC | PRN
  Administered 2017-11-01: 20 mL

## 2017-11-01 MED ORDER — AMOXICILLIN-POT CLAVULANATE 875-125 MG PO TABS: 1.0000 | ORAL_TABLET | Freq: Two times a day (BID) | ORAL | 0 refills | Status: AC

## 2017-11-01 MED FILL — AMOX TR-K CLV 875-125 MG TA: 875-125 | 10 days supply | Qty: 20 | Fill #0

## 2017-11-01 MED FILL — ALLOPURINOL 100 MG TABS: 100 | 30 days supply | Qty: 60 | Fill #0

## 2017-11-01 NOTE — Plan of Care (Signed)
Will con to mon

## 2017-11-01 NOTE — Care Management Note (Signed)
Case Management Note  Patient Details  Name: CARRON JAGGI MRN: [id number] Date of Birth: [birth year]  Subjective/Objective: Pt admitted with Malignant pleural effusion                   Action/Plan: Plan to discharge home with no needs.    Expected Discharge Date:  11/01/17               Expected Discharge Plan:  Home/Self Care  In-House Referral:     Discharge planning Services  CM Consult  Post Acute Care Choice:    Choice offered to:     DME Arranged:    DME Agency:     HH Arranged:    HH Agency:     Status of Service:  In process, will continue to follow  If discussed at Long Length of Stay Meetings, dates discussed:    Additional CommentsPurcell Mouton, RN 11/01/2017, 11:04 AM

## 2017-11-01 NOTE — Discharge Summary (Signed)
Discharge Summary  Kristopher Johnson WUJ:[id number] DOB: 11-02-1955  PCP: Patient, No Pcp Per  Admit date: 10/25/2017 Discharge date: 11/01/2017  Time spent: <43mins  Recommendations for Outpatient Follow-up:   1. F/u with oncology Dr Irene Limbo on 1/29  Discharge Diagnoses:  Active Hospital Problems   Diagnosis Date Noted  . 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 Protein-calorie malnutrition, severe 10/29/2017  . Large pleural effusion 10/25/2017  . Protein-calorie malnutrition ([place]) 11/01/2015  . 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 Shortness of breath 10/27/2015    Resolved Hospital Problems  No resolved problems to display.    Discharge Condition: stable  Diet recommendation: regular diet  Filed Weights   10/25/17 0838 10/25/17 1631  Weight: 92.5 kg (204 lb) 82.9 kg (182 lb 12.2 oz)    History of present illness:  Kristopher Johnson  is a 62 y.o. male, with stage IV mantle cell lymphoma diagnosed in 10/2015, is a severe gradient status post R-CHOP 6 cycles followed by maintenance Rituxan, awaiting PET scan, retinitis pigmentosa with legal blindness was sent to the ED from PET scan which was being scheduled today. Patient complains of increasing shortness of breath since early this month which has progressed and associated orthopnea. He also reports chronic epigastric abdominal pain and having difficulty with regular bowel movements due to rectal pain (reports passing mainly mucus and also having very poor by mouth intake). Patient denies fevers but does report occasional night sweats. Denies chest pain, palpitations, nausea, vomiting, headache, dizziness, abdominal pain, dysuria, tingling or numbness of extremities.  Course in the ED neck line patient was tachycardic low 100s, tachypneic, O2 sat dropped to 80% on room air, afebrile. Blood work showed invasive 17.4 K, normal chemistry, glucose of 154. Chest x-ray showed large left-sided pleural  effusion with possible consolidation. 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  - pt is started on Ibrutinib 500 mg PO QD from 1/22 -repeat cxr  On 1/25 " Decreasing left effusion. New small right effusion. Areas of left perihilar and lower lobe atelectasis"  -  Dr Irene Limbo wants to give ibrutinib some time to see if it help reduced the pleural effusion. He is discharged on augmentin,  follow up with Dr Irene Limbo on 1/29.     Mantle cell lymphoma of lymph nodes of multiple regions [facility]) - follows with Dr. Irene Limbo, appreciate his assistance  -he reports although acyclovir and allopurinol listed as home meds for him, he has never taken it. He is to follow with oncology to discuss this. - management per Dr. 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 - per Dr. Irene Limbo, also recommended stool softeners, pt now with diarrhea, will hold off on stool softeners to see how pt does     Diarrhea - hold stool softeners  for now - he reports loose stool but not watery, no abdominal pain, improving.   Severe malnutrition in context of chronic illness  Protein-calorie malnutrition ([place]) - in the setting of malignancy  -Pt has had 11% weightt loss in the past month, which is significant for time frame Nutrition input appreciated  DVT prophylaxis while in the hospital: Lovenox  Code Status: Full  Family Communication: pt at bedside  Disposition Plan: home on 1/25  Consultants:   IR  Dr. Irene Limbo Oncologist   Procedures:   Left thoracentesis on 1/18 and 1/22  Antimicrobials:   Vancomycin 10/26/2017 --> 01/22  Ampicillin 01/22 --> 1/25   Discharge Exam: BP 134/76 (BP Location: Left Arm)   Pulse 75   Temp 98.5 F (36.9 C) (Oral)   Resp 18   Ht 5\' 10"  (1.778 m)   Wt 82.9 kg (182 lb 12.2 oz)   SpO2 92%   BMI 26.22 kg/m   General: NAD, thin Cardiovascular: RRR Respiratory: improved aeration, no wheezing, no rales, no rhonchi  Discharge Instructions You were cared for by a hospitalist during your hospital stay. If you have any questions about your discharge medications or the care you received while you were in the hospital after you are discharged, you can call the unit and asked to speak with the hospitalist on call if the hospitalist that took care of you is not available. Once you are discharged, your primary care physician will handle any further medical issues. Please note that NO REFILLS for any discharge medications will be authorized once you are discharged, as it is imperative that you return to your primary care physician (or establish a relationship with a primary care physician if you do not have one) for your aftercare needs so that they can reassess your need for medications and monitor your lab values.  Discharge Instructions    Diet general   Complete by:  As directed    Increase activity slowly   Complete by:  As directed      Allergies as of 11/01/2017   No Known  Allergies     Medication List    STOP taking these medications   acyclovir 400 MG tablet Commonly known as:  ZOVIRAX   dexamethasone 4 MG tablet Commonly known as:  DECADRON     TAKE these medications   allopurinol 100 MG tablet Commonly known as:  ZYLOPRIM Take 1 tablet (100 mg total) by mouth 2 (two) times daily.   amoxicillin-clavulanate 875-125 MG tablet Commonly known as:  AUGMENTIN Take 1 tablet by mouth 2 (two) times daily for 10 days.   hydrOXYzine 25 MG tablet Commonly known as:  ATARAX/VISTARIL Take 1 tablet (25 mg total) by mouth every 6 (six) hours as needed for itching.   Ibrutinib 560 MG Tabs Take 560 mg by mouth daily. Take with a full glass of water at approximately the same time daily, maintain hydration.   lidocaine-prilocaine cream Commonly known as:  EMLA Apply 1 application topically as needed.      No Known Allergies Follow-up Information    [facility] Follow up.   Specialty:  Radiology Contact information: [place]  Gilman Schmidt [id number] [place] [phone]           The results of significant diagnostics from this hospitalization (including imaging, microbiology, ancillary and laboratory) are listed below for reference.    Significant Diagnostic Studies: Dg Chest 1 View  Result Date: 10/29/2017 CLINICAL DATA:  Status post left thoracentesis. EXAM: CHEST 1 VIEW COMPARISON:  10/27/2017 FINDINGS: Right chest wall port a catheter noted with tip at the cavoatrial junction. There has been interval decrease in volume of the right pleural effusion from previous exam. The left pleural effusion is also decreased in the interval. No left-sided pneumothorax status post thoracentesis. Resolution of previous interstitial edema. IMPRESSION: 1. No pneumothorax following left-sided thoracentesis. Electronically Signed   By: Kerby Moors M.D.   On: 10/29/2017 16:22   Dg Chest 1  View  Result Date: 10/25/2017 CLINICAL DATA:  Left pleural effusion.  Status post thoracentesis. EXAM: CHEST 1 VIEW 1 p.m. COMPARISON:  10/25/2017 8:51 a.m. and PET-CT dated 06/19/2017 FINDINGS: There has been reduction in the size of the large left pleural effusion. Moderate effusion persists. No pneumothorax. There is abnormal soft tissue density at the right lung base medially which could represent a mass. There is slight focal pleural thickening laterally at the right lung base which is felt represent loculated fluid. Heart size and vascularity appear normal.  Power port in place. IMPRESSION: 1. No pneumothorax after left thoracentesis. 2. Moderate residual left effusion. 3. Abnormal soft tissue density at the right base medially which could represent a mass. This was not present on the PET-CT dated 06/19/2017. Electronically Signed   By: Lorriane Shire M.D.   On: 10/25/2017 13:16   Dg Chest 2 View  Result Date: 11/01/2017 CLINICAL DATA:  Pleural effusion.  Lymphoma. EXAM: CHEST  2 VIEW COMPARISON:  10/29/2017 FINDINGS: Right Port-A-Cath in place with the tip in the SVC, unchanged. Small left pleural effusion is decreased since prior study. Small right pleural effusion, new since prior study. Left perihilar platelike densities likely reflect atelectasis. Right base atelectasis noted. These areas are new since prior study. Heart is normal size. IMPRESSION: Decreasing left effusion. New small right effusion. Areas of left perihilar and lower lobe atelectasis. Electronically Signed   By: Rolm Baptise M.D.   On: 11/01/2017 09:47   Dg Chest 2 View  Result Date: 10/27/2017 CLINICAL DATA:  62 year old male with shortness of breath and cough. Stage IV mantle cell lymphoma. EXAM: CHEST  2 VIEW COMPARISON:  PET-CT 10/25/2017, and earlier. FINDINGS: Right chest porta cath, accessed. Moderate-sized left and small right pleural effusions persist and are associated with bulky pleural nodularity (lateral view)  corresponding to the recent hypermetabolic pleural disease on PET. The mediastinal contour is more normal. Visualized tracheal air column is within normal limits. No cardiomegaly. No pneumothorax or pulmonary edema. No acute osseous abnormality identified radiographically. Paucity of bowel gas in the upper abdomen. IMPRESSION: 1. Bulky bilateral pleural tumor with moderate left and small right pleural effusions redemonstrated. See also report of the PET-CT 10/25/2017. 2. No new cardiopulmonary abnormality. Electronically Signed   By: Genevie Ann M.D.   On: 10/27/2017 15:31   Dg Chest Left Decubitus  Result Date: 11/01/2017 CLINICAL DATA:  Left pleural effusion follow-up.  Lymphoma. EXAM: CHEST - LEFT DECUBITUS COMPARISON:  Chest two-view 10/29/2017, CT 10/25/2017 FINDINGS: Left basilar pleural base mass noted on recent PET scan. No significant layering effusion. Mild left lower lobe atelectasis Mild blunting of the right costophrenic angle is noted. Central  line in the SVC. IMPRESSION: Pleural base mass laterally in the left lung base based on prior PET. Negative for layering pleural effusion. Electronically Signed   By: Franchot Gallo M.D.   On: 11/01/2017 09:39   Nm Pet Image Restag (ps) Skull Base To Thigh  Result Date: 10/25/2017 CLINICAL DATA:  Subsequent treatment strategy for stage IV mantle cell lymphoma. EXAM: NUCLEAR MEDICINE PET SKULL BASE TO THIGH TECHNIQUE: 10.2 mCi F-18 FDG was injected intravenously. Full-ring PET imaging was performed from the skull base to thigh after the radiotracer. CT data was obtained and used for attenuation correction and anatomic localization. FASTING BLOOD GLUCOSE:  Value: 103 mg/dl COMPARISON:  06/19/2017 FINDINGS: NECK: No hypermetabolic lymph nodes in the neck. CHEST: Marked interval progression of disease in the chest. There is bulky right axillary and subpectoral lymphadenopathy. Hypermetabolic lymphadenopathy identified in the left thoracic inlet, mediastinum, and  hilar regions. Interval development of bulky pleural disease bilaterally. Index right axillary lymph node (image 55 series 4) measures 2.2 cm with SUV max = 12.8. Anterior left pleural mass versus markedly enlarged internal mammary lymph node measuring 3.7 cm (image 63 series 4) demonstrates SUV max = 15.2. 6.0 x 3.2 cm mass lesion in the medial right lung base, adjacent to the heart is probably the same lesion measured on the abdomen and pelvis CT of 02/28/2017 at 2.8 x 1.9 cm. This is markedly hypermetabolic with SUV max = 67.8. Hypermetabolic left anterior pleural lesion extends into the chest wall on image 84 series 4. Tiny right and moderate left pleural effusions are evident. Right Port-A-Cath tip is in the distal SVC. ABDOMEN/PELVIS: There is omental and/or colonic wall nodular hypermetabolism. 1.4 x 2.0 cm nodule contiguous with the wall of the hepatic flexure (image 139 series 4) is hypermetabolic with SUV max = 93.8. Similar nodule along the distal transverse colon (image 144 series 4) is hypermetabolic. Hypermetabolic lymph node posterior to the stomach is identified on image 141. A large 3.2 x 2.7 cm soft tissue lesion anterior to the right psoas muscle on image 148 of series 4 demonstrates SUV max = 18.5. Other scattered hypermetabolic lymphadenopathy is seen in the retroperitoneum and pelvic sidewall. Marked interval progression of the distal sigmoid/rectal mass with appearance now similar back to the study from 02/28/2017. Atherosclerotic calcification noted in the abdominal aorta. No evidence for bowel obstruction. Large bilateral groin hernias contain small bowel loops without complicating features. SKELETON: Hypermetabolic nodules are seen in the subcutaneous fat of the anterior abdominal wall. There is a hypermetabolic lesion in the upper left rectus musculature (image 103 series 4). Other scattered hypermetabolic nodules are seen in the musculoskeletal anatomy. A 19 mm nodule is seen in the  quadriceps musculature of the proximal left thigh. IMPRESSION: 1. Marked interval progression of hypermetabolic disease in the chest, abdomen, and pelvis. There is hypermetabolic lymphadenopathy in the chest with very marked bilateral pleural disease including a left anterior pleural-based lesion that invades the chest wall. Hypermetabolic lymphadenopathy is seen in the abdomen/pelvis with multiple hypermetabolic lesions along the surface of the colon and the distal sigmoid/rectal lesion has progressed substantially in the interval and is now similar in appearance to the previous CT scan of 02/28/2017. Diffuse subcutaneous and musculoskeletal soft tissue hypermetabolic lesions are associated. 2. Tiny right and moderate left pleural effusions. 3.  Aortic Atherosclerois (ICD10-170.0) 4. Large bilateral groin hernias contain small bowel without complicating features. Electronically Signed   By: Misty Stanley M.D.   On: 10/25/2017 16:04   Dg  Chest Port 1 View  Result Date: 10/25/2017 CLINICAL DATA:  Shortness of breath.  History of lymphoma EXAM: PORTABLE CHEST 1 VIEW COMPARISON:  PET-CT June 19, 2017. FINDINGS: Port-A-Cath tip is in superior vena cava. Humor needle is placed in the port. No pneumothorax. There is a large left pleural effusion with consolidation left mid lower lung zones. The right lung is clear. Heart appears mildly enlarged. Visualized pulmonary vascularity is normal. No adenopathy evident in regions that can be assessed. No bone lesions. IMPRESSION: Sizable pleural effusion with left mid and lower lung zone consolidation. Right lung clear. Heart appears prominent. Port-A-Cath tip in superior vena cava. No pneumothorax. Electronically Signed   By: Lowella Grip III M.D.   On: 10/25/2017 09:25   US Thoracentesis Asp Pleural Space W/img Guide  Result Date: 10/30/2017 INDICATION: Patient with history of stage IV mantle cell lymphoma with recurrent malignant left pleural effusion,  dyspnea, pleural fluid with Enterococcus in recent cultures. Request made for diagnostic and therapeutic left thoracentesis. EXAM: ULTRASOUND GUIDED DIAGNOSTIC AND THERAPEUTIC LEFT THORACENTESIS MEDICATIONS: None. COMPLICATIONS: None immediate. PROCEDURE: An ultrasound guided thoracentesis was thoroughly discussed with the patient and questions answered. The benefits, risks, alternatives and complications were also discussed. The patient understands and wishes to proceed with the procedure. Written consent was obtained. Ultrasound was performed to localize and mark an adequate pocket of fluid in the left chest. The area was then prepped and draped in the normal sterile fashion. 1% Lidocaine was used for local anesthesia. Under ultrasound guidance a 6 Fr Safe-T-Centesis catheter was introduced. Thoracentesis was performed. The catheter was removed and a dressing applied. FINDINGS: A total of approximately 2.4 liters of hazy, amber fluid was removed. Samples were sent to the laboratory as requested by the clinical team. IMPRESSION: Successful ultrasound guided diagnostic and therapeutic left thoracentesis yielding 2.4 liters of pleural fluid. Read by: Rowe Kawika, PA-C Electronically Signed   By: Sandi Mariscal M.D.   On: 10/29/2017 15:48   US Thoracentesis Asp Pleural Space W/img Guide  Result Date: 10/25/2017 INDICATION: Mantle cell lymphoma with a left-sided pleural effusion. Request is made for diagnostic and therapeutic thoracentesis. EXAM: ULTRASOUND GUIDED DIAGNOSTIC AND THERAPEUTIC THORACENTESIS MEDICATIONS: 1% lidocaine COMPLICATIONS: None immediate. PROCEDURE: An ultrasound guided thoracentesis was thoroughly discussed with the patient and questions answered. The benefits, risks, alternatives and complications were also discussed. The patient understands and wishes to proceed with the procedure. Written consent was obtained. Ultrasound was performed to localize and mark an adequate pocket of fluid in the  left chest. The area was then prepped and draped in the normal sterile fashion. 1% Lidocaine was used for local anesthesia. Under ultrasound guidance a Safe-T-Centesis catheter was introduced. Thoracentesis was performed. The catheter was removed and a dressing applied. FINDINGS: A total of approximately 2 L of amber fluid was removed. Samples were sent to the laboratory as requested by the clinical team. IMPRESSION: Successful ultrasound guided left thoracentesis yielding 2 L of pleural fluid. The procedure was terminated at this time secondary to his first time maximum of 2 L. Read by: Saverio Danker, PA-C Electronically Signed   By: Markus Daft M.D.   On: 10/25/2017 13:17    Microbiology: Recent Results (from the past 240 hour(s))  Fungus Culture With Stain     Status: None (Preliminary result)   Collection Time: 10/25/17 12:33 PM  Result Value Ref Range Status   Fungus Stain Final report  Final    Comment: (NOTE) Performed At: [facility] [address]  [place], [place] [id number] Rush Farmer MD WU:[id number]    Fungus (Mycology) Culture PENDING  Incomplete   Fungal Source PLEURAL  Final  Culture, body fluid-bottle     Status: Abnormal   Collection Time: 10/25/17 12:33 PM  Result Value Ref Range Status   Specimen Description FLUID PLEURAL  Final   Special Requests NONE  Final   Gram Stain   Final    GRAM POSITIVE COCCI ANAEROBIC BOTTLE ONLY CRITICAL RESULT CALLED TO, READ BACK BY AND VERIFIED WITH: Bettina Gavia RN, AT [phone] 10/26/17 BY Rush Landmark Performed at [facility], [place] [address] [place], [place] [address]    Culture ENTEROCOCCUS FAECALIS (A)  Final   Report Status 10/28/2017 FINAL  Final   Organism ID, Bacteria ENTEROCOCCUS FAECALIS  Final      Susceptibility   Enterococcus faecalis - MIC*    AMPICILLIN <=2 SENSITIVE Sensitive     VANCOMYCIN 1 SENSITIVE Sensitive     GENTAMICIN SYNERGY SENSITIVE Sensitive     * ENTEROCOCCUS FAECALIS  Gram stain     Status:  None   Collection Time: 10/25/17 12:33 PM  Result Value Ref Range Status   Specimen Description FLUID  Final   Special Requests NONE  Final   Gram Stain   Final    ABUNDANT WBC PRESENT,BOTH PMN AND MONONUCLEAR NO ORGANISMS SEEN Performed at [facility], [place] [address] [place], [place] [address]    Report Status 10/25/2017 FINAL  Final  Fungus Culture Result     Status: None   Collection Time: 10/25/17 12:33 PM  Result Value Ref Range Status   Result 1 Comment  Final    Comment: (NOTE) KOH/Calcofluor preparation:  no fungus observed. Performed At: [facility], [place] [id number] Rush Farmer MD LK:[id number]   Body fluid culture     Status: None (Preliminary result)   Collection Time: 10/29/17  3:58 PM  Result Value Ref Range Status   Specimen Description PLEURAL  Final   Special Requests NONE  Final   Gram Stain   Final    MODERATE WBC PRESENT, PREDOMINANTLY MONONUCLEAR NO ORGANISMS SEEN    Culture   Final    NO GROWTH 3 DAYS Performed at [facility], [address], [place], [place] [address]    Report Status PENDING  Incomplete     Labs: Basic Metabolic Panel: Recent Labs  Lab 10/26/17 0441 10/27/17 0404 10/28/17 0500 10/30/17 0402 10/31/17 0416 11/01/17 0401  NA 138 138 139 138 137 136  K 3.5 3.5 4.2 4.0 4.0 4.2  CL 102 105 107 100* 100* 99*  CO2 27 26 25 29 30 30   GLUCOSE 160* 182* 204* 239* 249* 256*  BUN 12 11 13 13 14 13   CREATININE 0.92 0.79 0.68 0.80 0.73 0.77  CALCIUM 8.7* 8.8* 9.1 8.6* 8.5* 8.7*  PHOS 2.8  --   --   --  3.1 3.0   Liver Function Tests: Recent Labs  Lab 10/30/17 0402 10/31/17 0416 11/01/17 0401  AST 19 13* 17  ALT 32 26 27  ALKPHOS 67 66 65  BILITOT 0.4 0.5 0.3  PROT 5.0* 5.2* 5.5*  ALBUMIN 2.4* 2.4* 2.5*   No results for input(s): LIPASE, AMYLASE in the last 168 hours. No results for input(s): AMMONIA in the last 168 hours. CBC: Recent Labs  Lab 10/26/17 0441  10/27/17 0404 10/28/17 0500 10/30/17 0402 10/31/17 0416  WBC 14.3* 16.1* 16.7* 18.3* 17.5*  NEUTROABS  --   --   --   --  16.2*  HGB 12.2* 11.7* 11.4* 12.3* 11.8*  HCT 38.1* 37.1* 36.1* 38.1* 36.2*  MCV 92.7 91.6 92.1 90.9 90.0  PLT 305 290 290 275 245   Cardiac Enzymes: No results for input(s): CKTOTAL, CKMB, CKMBINDEX, TROPONINI in the last 168 hours. BNP: BNP (last 3 results) No results for input(s): BNP in the last 8760 hours.  ProBNP (last 3 results) No results for input(s): PROBNP in the last 8760 hours.  CBG: No results for input(s): GLUCAP in the last 168 hours.     Signed:  Florencia Reasons MD, PhD  [facility] 11/01/2017, 3:30 PM

## 2017-11-01 NOTE — Progress Notes (Signed)
Call placed to [facility] regarding stored prescription (Imbruvica) for medication pick up in chart. VM message left. Patient daughter Karlene Lineman called to report medication was returned to them by a pharmacist in the cancer center.

## 2017-11-02 LAB — BODY FLUID CULTURE: CULTURE: NO GROWTH

## 2017-11-05 ENCOUNTER — Encounter: Payer: Self-pay | Admitting: Hematology

## 2017-11-05 ENCOUNTER — Encounter: Payer: Self-pay | Admitting: *Deleted

## 2017-11-05 ENCOUNTER — Inpatient Hospital Stay: Payer: Medicare Other | Attending: Hematology

## 2017-11-05 ENCOUNTER — Inpatient Hospital Stay (HOSPITAL_BASED_OUTPATIENT_CLINIC_OR_DEPARTMENT_OTHER): Payer: Medicare Other | Admitting: Hematology

## 2017-11-05 VITALS — BP 96/45 | HR 84 | Temp 97.7°F | Resp 24 | Ht 70.0 in | Wt 180.6 lb

## 2017-11-05 DIAGNOSIS — C8318 Mantle cell lymphoma, lymph nodes of multiple sites: Secondary | ICD-10-CM

## 2017-11-05 DIAGNOSIS — R0602 Shortness of breath: Secondary | ICD-10-CM | POA: Insufficient documentation

## 2017-11-05 DIAGNOSIS — Z79899 Other long term (current) drug therapy: Secondary | ICD-10-CM | POA: Insufficient documentation

## 2017-11-05 DIAGNOSIS — R5383 Other fatigue: Secondary | ICD-10-CM | POA: Diagnosis not present

## 2017-11-05 DIAGNOSIS — R531 Weakness: Secondary | ICD-10-CM

## 2017-11-05 DIAGNOSIS — H3552 Pigmentary retinal dystrophy: Secondary | ICD-10-CM | POA: Insufficient documentation

## 2017-11-05 DIAGNOSIS — Z9221 Personal history of antineoplastic chemotherapy: Secondary | ICD-10-CM | POA: Diagnosis not present

## 2017-11-05 DIAGNOSIS — C189 Malignant neoplasm of colon, unspecified: Secondary | ICD-10-CM

## 2017-11-05 DIAGNOSIS — F17210 Nicotine dependence, cigarettes, uncomplicated: Secondary | ICD-10-CM | POA: Diagnosis not present

## 2017-11-05 DIAGNOSIS — J90 Pleural effusion, not elsewhere classified: Secondary | ICD-10-CM | POA: Insufficient documentation

## 2017-11-05 DIAGNOSIS — R232 Flushing: Secondary | ICD-10-CM | POA: Diagnosis not present

## 2017-11-05 DIAGNOSIS — J910 Malignant pleural effusion: Secondary | ICD-10-CM

## 2017-11-05 DIAGNOSIS — C787 Secondary malignant neoplasm of liver and intrahepatic bile duct: Secondary | ICD-10-CM

## 2017-11-05 LAB — CBC WITH DIFFERENTIAL (CANCER CENTER ONLY)
Basophils Absolute: 0.1 10*3/uL (ref 0.0–0.1)
Basophils Relative: 1 %
EOS PCT: 2 %
Eosinophils Absolute: 0.2 10*3/uL (ref 0.0–0.5)
HCT: 38.9 % (ref 38.4–49.9)
Hemoglobin: 12.5 g/dL — ABNORMAL LOW (ref 13.0–17.1)
LYMPHS PCT: 3 %
Lymphs Abs: 0.3 10*3/uL — ABNORMAL LOW (ref 0.9–3.3)
MCH: 28.9 pg (ref 27.2–33.4)
MCHC: 32.2 g/dL (ref 32.0–36.0)
MCV: 89.9 fL (ref 79.3–98.0)
MONO ABS: 0.9 10*3/uL (ref 0.1–0.9)
Monocytes Relative: 8 %
Neutro Abs: 9.2 10*3/uL — ABNORMAL HIGH (ref 1.5–6.5)
Neutrophils Relative %: 86 %
PLATELETS: 219 10*3/uL (ref 140–400)
RBC: 4.32 MIL/uL (ref 4.20–5.82)
RDW: 15 % (ref 11.0–15.6)
WBC: 10.7 10*3/uL — AB (ref 4.0–10.3)

## 2017-11-05 LAB — CMP (CANCER CENTER ONLY)
ALT: 24 U/L (ref 0–55)
ANION GAP: 6 (ref 3–11)
AST: 13 U/L (ref 5–34)
Albumin: 2.5 g/dL — ABNORMAL LOW (ref 3.5–5.0)
Alkaline Phosphatase: 66 U/L (ref 40–150)
BUN: 11 mg/dL (ref 7–26)
CALCIUM: 9.1 mg/dL (ref 8.4–10.4)
CHLORIDE: 102 mmol/L (ref 98–109)
CO2: 31 mmol/L — ABNORMAL HIGH (ref 22–29)
CREATININE: 0.83 mg/dL (ref 0.70–1.30)
GLUCOSE: 196 mg/dL — AB (ref 70–140)
POTASSIUM: 3.6 mmol/L (ref 3.5–5.1)
Sodium: 139 mmol/L (ref 136–145)
TOTAL PROTEIN: 5.8 g/dL — AB (ref 6.4–8.3)
Total Bilirubin: 0.3 mg/dL (ref 0.2–1.2)

## 2017-11-05 LAB — LACTATE DEHYDROGENASE: LDH: 222 U/L (ref 125–245)

## 2017-11-05 NOTE — Progress Notes (Signed)
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 Patient chooses not to pursue AutoHSCT based strategies which is quite reasonable considering his functional challenges with legal blindness and limited social support.  S/p 6 cycles of Bendamustine/Rituxan    HISTORY OF PRESENTING ILLNESS: plz see clinic note for details on initial presentation  Interval History:   Kristopher Johnson presents today for follow up for his mantle cell lymphoma.   Since last visit he was admitted to the hospital on 10/25/17 for his Mantle cell lymphoma and was discharged on 11/01/17. On 10/25/2017 he had a PET/CT which showed significant mantle cell lymphoma progression again. 11/05/17 Labs showed LDH at 331. He did not need another thoracentesis as his pleural effusion had decreased.   On review of symptoms, pt he notes he is doing better since hospitalization. He notes he is able to breather better now and his bowel habit have normalized and passage of mucous and gas has decreased. He no longer has rectal urgency and his bowels are moving better.  He notes he still has night sweats. He notes he has no energy and has a lack of motivation.  No prohibitive toxicities from the Ibrutinib at this time. Notes grade 1 fatigue.  PAST MEDICAL HISTORY:  Past Medical History:  Diagnosis Date  . Abscess of arm, right 10/27/2015  . Lymphoma of lymph nodes of multiple sites ([place]) 10/27/2015  . Retinitis pigmentosa    Patient notes that he has been declared legally blind since 1983 and is on Social  Security disability  . Shortness of breath 10/27/2015    SURGICAL HISTORY: Past Surgical History:  Procedure Laterality Date  . Cataract surgery     Patient notes she has had bilateral Surgery in 1998 and 99  . FLEXIBLE SIGMOIDOSCOPY N/A 03/01/2017   Procedure: FLEXIBLE SIGMOIDOSCOPY;  Surgeon: Manus Gunning, MD;  Location: [facility];  Service: Gastroenterology;  Laterality: N/A;  . TONSILLECTOMY     about 62years old    SOCIAL HISTORY: Social History   Socioeconomic History  . Marital status: Widowed    Spouse name: Not on file  . Number of children: Not on file  . Years of education: Not on file  . Highest education level: Not on file  Social Needs  . Financial resource strain: Not on file  . Food insecurity - worry: Not on file  . Food insecurity - inability: Not on file  . Transportation needs - medical: Not on file  . Transportation needs - non-medical: Not on file  Occupational History  . Not on file  Tobacco Use  . Smoking status: Current Every Day Smoker    Packs/day: 1.00    Years: 33.00    Pack years: 33.00  . Smokeless tobacco: Never Used  Substance and Sexual Activity  . Alcohol use: No  . Drug use: No  . Sexual activity: Not on file    Comment: Disabled, retinis pigmentosa legally blind, 4children MPOA Lynnn(cousin)  Other Topics Concern  . Not on file  Social History Narrative  . Not on file    FAMILY HISTORY:  Family History  Problem Relation Age of Onset  . Colon cancer Neg Hx     ALLERGIES:  has No Known Allergies.  MEDICATIONS:  Current Outpatient Medications  Medication Sig Dispense Refill  . allopurinol (ZYLOPRIM) 100 MG tablet Take 1 tablet (100 mg total) by mouth 2 (two) times daily. 60 tablet 0  . amoxicillin-clavulanate (AUGMENTIN) 875-125 MG tablet Take 1 tablet by mouth 2 (two) times daily for 10 days. 20 tablet 0  . Ibrutinib 560 MG TABS Take 560 mg by mouth daily. Take with a full glass of water at approximately the  same time daily, maintain hydration. 28 tablet 1  . lidocaine-prilocaine (EMLA) cream Apply 1 application topically as needed. (Patient not taking: Reported on 11/06/2016) 30 g 6   No current facility-administered medications for this visit.     REVIEW OF SYSTEMS:    A 10+ POINT REVIEW OF SYSTEMS WAS OBTAINED including neurology, dermatology, psychiatry, cardiac, respiratory, lymph, extremities, GI, GU, Musculoskeletal, constitutional, breasts, reproductive, HEENT.  All pertinent positives are noted in the HPI.  All others are negative.  PHYSICAL EXAMINATION:  ECOG PERFORMANCE STATUS: 2 - Symptomatic, <50% confined to bed  Vitals:   11/05/17 1050  BP: (!) 96/45  Pulse: 84  Resp: (!) 24  Temp: 97.7 F (36.5 C)  TempSrc: Oral  SpO2: 98%  Weight: 180 lb 9.6 oz (81.9 kg)  Height: 5\' 10"  (1.778 m)    GENERAL:alert, in no acute distress and comfortable SKIN: some nodular skin lesions over extremities -much improved OROPHARYNX:no exudate, no erythema and lips, multiple carious teeth with significant periodontal disease. NECK: supple, no JVD, thyroid normal size, non-tender, without nodularity LYMPH:  no palpable lymphadenopathy in the cervical, axillary or inguinal LUNGS: decreased air entry at left lung base HEART: regular rate & rhythm,  no murmurs and no lower extremity edema ABDOMEN: abdomen soft, non-tender, normoactive bowel sounds  Musculoskeletal: no cyanosis of digits and no clubbing  PSYCH: alert & oriented x 3 with fluent speech NEURO: no focal motor/sensory deficits  LABORATORY DATA:  I have reviewed the data as listed   CBC Latest Ref Rng & Units 11/05/2017 10/31/2017 10/30/2017  WBC 4.0 - 10.3 K/uL 10.7(H) 17.5(H) 18.3(H)  Hemoglobin 13.0 - 17.0 g/dL - 11.8(L) 12.3(L)  Hematocrit 38.4 - 49.9 % 38.9 36.2(L) 38.1(L)  Platelets 140 - 400 K/uL 219 245 275    . CMP Latest Ref Rng & Units 11/05/2017 11/01/2017 10/31/2017  Glucose 70 - 140 mg/dL 196(H) 256(H) 249(H)  BUN  7 - 26 mg/dL 11 13 14   Creatinine 0.61 - 1.24 mg/dL - 0.77 0.73  Sodium 136 - 145 mmol/L 139 136 137  Potassium 3.5 - 5.1 mmol/L 3.6 4.2 4.0  Chloride 98 - 109 mmol/L 102 99(L) 100(L)  CO2 22 - 29 mmol/L 31(H) 30 30  Calcium 8.4 - 10.4 mg/dL 9.1 8.7(L) 8.5(L)  Total Protein 6.4 - 8.3 g/dL 5.8(L) 5.5(L) 5.2(L)  Total Bilirubin 0.2 - 1.2 mg/dL 0.3 0.3 0.5  Alkaline Phos 40 - 150 U/L 66 65 66  AST 5 - 34 U/L 13 17 13(L)  ALT 0 - 55 U/L 24 27 26     Lab Results  Component Value Date   LDH 331 (H) 10/26/2017     RADIOGRAPHIC STUDIES: I have personally reviewed the radiological images as listed and agreed with the findings in the report. Dg Chest 1 View  Result Date: 10/29/2017 CLINICAL DATA:  Status post left thoracentesis. EXAM: CHEST 1 VIEW COMPARISON:  10/27/2017  FINDINGS: Right chest wall port a catheter noted with tip at the cavoatrial junction. There has been interval decrease in volume of the right pleural effusion from previous exam. The left pleural effusion is also decreased in the interval. No left-sided pneumothorax status post thoracentesis. Resolution of previous interstitial edema. IMPRESSION: 1. No pneumothorax following left-sided thoracentesis. Electronically Signed   By: Kerby Moors M.D.   On: 10/29/2017 16:22   Dg Chest 1 View  Result Date: 10/25/2017 CLINICAL DATA:  Left pleural effusion.  Status post thoracentesis. EXAM: CHEST 1 VIEW 1 p.m. COMPARISON:  10/25/2017 8:51 a.m. and PET-CT dated 06/19/2017 FINDINGS: There has been reduction in the size of the large left pleural effusion. Moderate effusion persists. No pneumothorax. There is abnormal soft tissue density at the right lung base medially which could represent a mass. There is slight focal pleural thickening laterally at the right lung base which is felt represent loculated fluid. Heart size and vascularity appear normal.  Power port in place. IMPRESSION: 1. No pneumothorax after left thoracentesis. 2. Moderate  residual left effusion. 3. Abnormal soft tissue density at the right base medially which could represent a mass. This was not present on the PET-CT dated 06/19/2017. Electronically Signed   By: Lorriane Shire M.D.   On: 10/25/2017 13:16   Dg Chest 2 View  Result Date: 11/01/2017 CLINICAL DATA:  Pleural effusion.  Lymphoma. EXAM: CHEST  2 VIEW COMPARISON:  10/29/2017 FINDINGS: Right Port-A-Cath in place with the tip in the SVC, unchanged. Small left pleural effusion is decreased since prior study. Small right pleural effusion, new since prior study. Left perihilar platelike densities likely reflect atelectasis. Right base atelectasis noted. These areas are new since prior study. Heart is normal size. IMPRESSION: Decreasing left effusion. New small right effusion. Areas of left perihilar and lower lobe atelectasis. Electronically Signed   By: Rolm Baptise M.D.   On: 11/01/2017 09:47   Dg Chest 2 View  Result Date: 10/27/2017 CLINICAL DATA:  62 year old male with shortness of breath and cough. Stage IV mantle cell lymphoma. EXAM: CHEST  2 VIEW COMPARISON:  PET-CT 10/25/2017, and earlier. FINDINGS: Right chest porta cath, accessed. Moderate-sized left and small right pleural effusions persist and are associated with bulky pleural nodularity (lateral view) corresponding to the recent hypermetabolic pleural disease on PET. The mediastinal contour is more normal. Visualized tracheal air column is within normal limits. No cardiomegaly. No pneumothorax or pulmonary edema. No acute osseous abnormality identified radiographically. Paucity of bowel gas in the upper abdomen. IMPRESSION: 1. Bulky bilateral pleural tumor with moderate left and small right pleural effusions redemonstrated. See also report of the PET-CT 10/25/2017. 2. No new cardiopulmonary abnormality. Electronically Signed   By: Genevie Ann M.D.   On: 10/27/2017 15:31   Dg Chest Left Decubitus  Result Date: 11/01/2017 CLINICAL DATA:  Left pleural effusion  follow-up.  Lymphoma. EXAM: CHEST - LEFT DECUBITUS COMPARISON:  Chest two-view 10/29/2017, CT 10/25/2017 FINDINGS: Left basilar pleural base mass noted on recent PET scan. No significant layering effusion. Mild left lower lobe atelectasis Mild blunting of the right costophrenic angle is noted. Central line in the SVC. IMPRESSION: Pleural base mass laterally in the left lung base based on prior PET. Negative for layering pleural effusion. Electronically Signed   By: Franchot Gallo M.D.   On: 11/01/2017 09:39   Nm Pet Image Restag (ps) Skull Base To Thigh  Result Date: 10/25/2017 CLINICAL DATA:  Subsequent treatment strategy for stage IV mantle cell lymphoma. EXAM:  NUCLEAR MEDICINE PET SKULL BASE TO THIGH TECHNIQUE: 10.2 mCi F-18 FDG was injected intravenously. Full-ring PET imaging was performed from the skull base to thigh after the radiotracer. CT data was obtained and used for attenuation correction and anatomic localization. FASTING BLOOD GLUCOSE:  Value: 103 mg/dl COMPARISON:  06/19/2017 FINDINGS: NECK: No hypermetabolic lymph nodes in the neck. CHEST: Marked interval progression of disease in the chest. There is bulky right axillary and subpectoral lymphadenopathy. Hypermetabolic lymphadenopathy identified in the left thoracic inlet, mediastinum, and hilar regions. Interval development of bulky pleural disease bilaterally. Index right axillary lymph node (image 55 series 4) measures 2.2 cm with SUV max = 12.8. Anterior left pleural mass versus markedly enlarged internal mammary lymph node measuring 3.7 cm (image 63 series 4) demonstrates SUV max = 15.2. 6.0 x 3.2 cm mass lesion in the medial right lung base, adjacent to the heart is probably the same lesion measured on the abdomen and pelvis CT of 02/28/2017 at 2.8 x 1.9 cm. This is markedly hypermetabolic with SUV max = 44.8. Hypermetabolic left anterior pleural lesion extends into the chest wall on image 84 series 4. Tiny right and moderate left pleural  effusions are evident. Right Port-A-Cath tip is in the distal SVC. ABDOMEN/PELVIS: There is omental and/or colonic wall nodular hypermetabolism. 1.4 x 2.0 cm nodule contiguous with the wall of the hepatic flexure (image 139 series 4) is hypermetabolic with SUV max = 18.5. Similar nodule along the distal transverse colon (image 144 series 4) is hypermetabolic. Hypermetabolic lymph node posterior to the stomach is identified on image 141. A large 3.2 x 2.7 cm soft tissue lesion anterior to the right psoas muscle on image 148 of series 4 demonstrates SUV max = 18.5. Other scattered hypermetabolic lymphadenopathy is seen in the retroperitoneum and pelvic sidewall. Marked interval progression of the distal sigmoid/rectal mass with appearance now similar back to the study from 02/28/2017. Atherosclerotic calcification noted in the abdominal aorta. No evidence for bowel obstruction. Large bilateral groin hernias contain small bowel loops without complicating features. SKELETON: Hypermetabolic nodules are seen in the subcutaneous fat of the anterior abdominal wall. There is a hypermetabolic lesion in the upper left rectus musculature (image 103 series 4). Other scattered hypermetabolic nodules are seen in the musculoskeletal anatomy. A 19 mm nodule is seen in the quadriceps musculature of the proximal left thigh. IMPRESSION: 1. Marked interval progression of hypermetabolic disease in the chest, abdomen, and pelvis. There is hypermetabolic lymphadenopathy in the chest with very marked bilateral pleural disease including a left anterior pleural-based lesion that invades the chest wall. Hypermetabolic lymphadenopathy is seen in the abdomen/pelvis with multiple hypermetabolic lesions along the surface of the colon and the distal sigmoid/rectal lesion has progressed substantially in the interval and is now similar in appearance to the previous CT scan of 02/28/2017. Diffuse subcutaneous and musculoskeletal soft tissue  hypermetabolic lesions are associated. 2. Tiny right and moderate left pleural effusions. 3.  Aortic Atherosclerois (ICD10-170.0) 4. Large bilateral groin hernias contain small bowel without complicating features. Electronically Signed   By: Misty Stanley M.D.   On: 10/25/2017 16:04   Dg Chest Port 1 View  Result Date: 10/25/2017 CLINICAL DATA:  Shortness of breath.  History of lymphoma EXAM: PORTABLE CHEST 1 VIEW COMPARISON:  PET-CT June 19, 2017. FINDINGS: Port-A-Cath tip is in superior vena cava. Humor needle is placed in the port. No pneumothorax. There is a large left pleural effusion with consolidation left mid lower lung zones. The right lung is clear.  Heart appears mildly enlarged. Visualized pulmonary vascularity is normal. No adenopathy evident in regions that can be assessed. No bone lesions. IMPRESSION: Sizable pleural effusion with left mid and lower lung zone consolidation. Right lung clear. Heart appears prominent. Port-A-Cath tip in superior vena cava. No pneumothorax. Electronically Signed   By: Lowella Grip III M.D.   On: 10/25/2017 09:25   US Thoracentesis Asp Pleural Space W/img Guide  Result Date: 10/30/2017 INDICATION: Patient with history of stage IV mantle cell lymphoma with recurrent malignant left pleural effusion, dyspnea, pleural fluid with Enterococcus in recent cultures. Request made for diagnostic and therapeutic left thoracentesis. EXAM: ULTRASOUND GUIDED DIAGNOSTIC AND THERAPEUTIC LEFT THORACENTESIS MEDICATIONS: None. COMPLICATIONS: None immediate. PROCEDURE: An ultrasound guided thoracentesis was thoroughly discussed with the patient and questions answered. The benefits, risks, alternatives and complications were also discussed. The patient understands and wishes to proceed with the procedure. Written consent was obtained. Ultrasound was performed to localize and mark an adequate pocket of fluid in the left chest. The area was then prepped and draped in the normal  sterile fashion. 1% Lidocaine was used for local anesthesia. Under ultrasound guidance a 6 Fr Safe-T-Centesis catheter was introduced. Thoracentesis was performed. The catheter was removed and a dressing applied. FINDINGS: A total of approximately 2.4 liters of hazy, amber fluid was removed. Samples were sent to the laboratory as requested by the clinical team. IMPRESSION: Successful ultrasound guided diagnostic and therapeutic left thoracentesis yielding 2.4 liters of pleural fluid. Read by: Rowe Eswin, PA-C Electronically Signed   By: Sandi Mariscal M.D.   On: 10/29/2017 15:48   US Thoracentesis Asp Pleural Space W/img Guide  Result Date: 10/25/2017 INDICATION: Mantle cell lymphoma with a left-sided pleural effusion. Request is made for diagnostic and therapeutic thoracentesis. EXAM: ULTRASOUND GUIDED DIAGNOSTIC AND THERAPEUTIC THORACENTESIS MEDICATIONS: 1% lidocaine COMPLICATIONS: None immediate. PROCEDURE: An ultrasound guided thoracentesis was thoroughly discussed with the patient and questions answered. The benefits, risks, alternatives and complications were also discussed. The patient understands and wishes to proceed with the procedure. Written consent was obtained. Ultrasound was performed to localize and mark an adequate pocket of fluid in the left chest. The area was then prepped and draped in the normal sterile fashion. 1% Lidocaine was used for local anesthesia. Under ultrasound guidance a Safe-T-Centesis catheter was introduced. Thoracentesis was performed. The catheter was removed and a dressing applied. FINDINGS: A total of approximately 2 L of amber fluid was removed. Samples were sent to the laboratory as requested by the clinical team. IMPRESSION: Successful ultrasound guided left thoracentesis yielding 2 L of pleural fluid. The procedure was terminated at this time secondary to his first time maximum of 2 L. Read by: Saverio Danker, PA-C Electronically Signed   By: Markus Daft M.D.   On:  10/25/2017 13:17   ASSESSMENT & PLAN:    62 yo man with   1) h/o previous Stage IVB E Mantle Cell lymphoma with likely gastric involvement, extensive LNadenopathy. ECHO nl EF ECOG PS 2 but activities significantly limited due to being legally blind HIV/Hep C/Hep B neg Baseline LDH 450--->135-->163-->144  PET/CT scan after 3 cycles of R CHOP show good overall response. Stomach lesion still with very FDG uptake ? Residual lymphoma versus inflammation.  Patient is status post 6 cycles of R CHOP. PET/CT after 6 cycles shows resolution of hypermetabolic lymphadenopathy and gastric wall activity . Patient was on maintenance Rituxan for 1 year now prior to relapse.   2) 1st Relapsed Pleomorphic mantle cell lymphoma  Presented with rectal bleeding and constipation with a large rectosigmoid mass and other evidence of colonic involvement. CT chest abdomen pelvis showed multiple areas of lymphadenopathy in the abdomen as well as in the chest suggesting significant recurrence. PET/CT scan 03/25/2017 shows diffuse involvement with mantle cell lymphoma involving the chest abdomen pelvis and bowels. 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 Workup showed malignant pleural effusion and b/l pleural involvement and rectal urgency due to mantle cell lymphoma causing mass in rectum/sigmoid -PET/CT 10/25/2017 - with significant mantle cell lymphoma progression again. -10/26/17 labs showed LDH at 331.  -He was placed on Ibrutinib 560 mg p.o. Daily.   PLAN:  -patient appears to be tolerating his Ibrutinib without any acute issues. No prohibitive toxicities and clinical feeling better. -I reviewed his labs today, WBC at 10.7 and Hg at 12.5, overall improved.  -Continue with Ibrutinib 560 mg p.o. Daily until he can no longer tolerate it or until disease progression.  -Will do Chest XR in 3 weeks to  make sure his pleural effusion is stable and not progressing -Will do a repeat PET scan in 2-3 months to monitor response to treatment.  -Previously, he had dry skin and pruritus but no nodules at this time. Previously recommended using moisturizer and sarna lotion.  -continue antibiotic Augmentin until completed 10 day dose for enterococcal infection in pleural fluid vs surface contamination  3) Retinitis pigmentosa causing legal blindness -  is seeing a retina specialist in town. No acute interventions at this time. Notes that his vision is progressively getting worse. His daughter and granddaughter are helping him out at home.   4) Weight loss and protein calorie malnutrition. Resolved. Weight has improved significantly since his diagnosis and back to his baseline weight.  Wt Readings from Last 3 Encounters:  11/05/17 180 lb 9.6 oz (81.9 kg)  10/25/17 182 lb 12.2 oz (82.9 kg)  09/17/17 204 lb 3.2 oz (92.6 kg)   5) h/o Peri-orbital dermatitis /Conjuncitivitis - patient was seen by the dermatologist and given topical desonide ointment which led to the resolution of his dermatitis. There were not sure about the etiology but noted then to could not rule out Rituxan is a possibility. Currently his dermatitis has resolved and his conjunctivitis has resolved. He did not have rash anywhere else at the time. No oral sores or other toxicities. This happened about 4 days after Rituxan another chemotherapy. This has completely resolved and patient has not had an further issues with this with several additional doses of Rituxan.  6) Patient Active Problem List   Diagnosis Date Noted  . Protein-calorie malnutrition, severe 10/29/2017  . 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 Large pleural effusion 10/25/2017  . Counseling regarding advanced care planning and goals of care 03/13/2017  . Rectal mass 03/02/2017  . Colon cancer metastasized to liver and lung([facility]) 03/02/2017  . Rectal bleeding 02/28/2017    . Portacath in place 01/30/2016  . Skin lesion of chest wall 12/19/2015  . Shingles 12/19/2015  . Rash 11/28/2015  . 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 Mantle cell lymphoma of lymph nodes of multiple regions ([place]) 11/01/2015  . Protein-calorie malnutrition ([place]) 11/01/2015  . Abscess of arm, right 10/27/2015  . Lymphoma of lymph nodes of multiple sites ([place]) 10/27/2015  . Shortness of breath 10/27/2015  . Shortness of breath dyspnea 10/27/2015  . Hypoalbuminemia 10/25/2015  . Elevated lipase 10/25/2015  . Inguinal hernia  10/25/2015  . Urinary retention 10/25/2015  . Abdominal pain   . Lymphadenopathy 10/24/2015   -continue f/u with PCP for other continued medical issues.   RTC with Dr Irene Limbo in 3 weeks with labs and a chest XRay  I spent 20 minutes counseling the patient face to face. The total time spent in the appointment was 25 minutes and more than 50% was on counseling and direct patient cares. Patient was accompanied by his grand-daughter for this visit.   Sullivan Lone MD [place] AAHIVMS [facility] Hematology/Oncology Physician [place]  (Office):       [phone] (Work cell):  [phone] (Fax):           [phone]  This document serves as a record of services personally performed by Sullivan Lone, MD. It was created on his behalf by Joslyn Devon, a trained medical scribe. The creation of this record is based on the scribe's personal observations and the provider's statements to them.    .I have reviewed the above documentation for accuracy and completeness, and I agree with the above. Brunetta Genera MD MS

## 2017-11-21 MED FILL — IMBRUVICA 560 MG TAB: 560 | 28 days supply | Qty: 28 | Fill #1

## 2017-11-26 LAB — FUNGUS CULTURE WITH STAIN

## 2017-11-26 LAB — FUNGAL ORGANISM REFLEX

## 2017-11-26 LAB — FUNGUS CULTURE RESULT

## 2017-11-27 NOTE — Progress Notes (Signed)
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 Patient chooses not to pursue AutoHSCT based strategies which is quite reasonable considering his functional challenges with legal blindness and limited social support.  S/p 6 cycles of Bendamustine/Rituxan    HISTORY OF PRESENTING ILLNESS: plz see clinic note for details on initial presentation  Interval History:   Kristopher Johnson presents today for follow up for his mantle cell lymphoma. The patient's last visit with [place] was on 11/05/17. He is accompanied today by his daughter. The pt reports that he is doing well overall and reports feeling much better since his hospital visit.  He continues on his Ibrutinib without complaint, which he has been taking for almost 4 weeks now. He reports that his appetite has returned and that his energy and comfort level have normalized. He reports that his bowel urgency has normalized. He reports that his breathing has become comfortable again.   CXR on 2/21 shows -Right pleural effusion noted on prior exam has resolved. Interval development of mild left basilar subsegmental atelectasis and pleural effusion.  Lab results today (11/28/17) of CBC, CMP, and Reticulocytes is as follows: all values are WNL except for Hgb at 12.9, MCHC at 31.2, RDW at 15.8, Lymphs abs at 0.6, Monocytes abs at 1.0, Retic Ct Pct at 2.4%, Retic count abs at 106.6, CO2 at 30, Glucose at 156, Total protein at 5.9, and Albumin at 3.3. LDH is WNL at 228 on 11/28/17.  On review of systems, pt reports strong appetite, adding  desired weight, and denies SOB, bathroom irregularities, night sweat, lumps, bumps, rashes, nausea, vomiting, leg swelling and any other symptoms.    PAST MEDICAL HISTORY:  Past Medical History:  Diagnosis Date  . Abscess of arm, right 10/27/2015  . Lymphoma of lymph nodes of multiple sites ([place]) 10/27/2015  . 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 Shortness of breath 10/27/2015    SURGICAL HISTORY: Past Surgical History:  Procedure Laterality Date  . Cataract surgery     Patient notes she has had bilateral Surgery in 1998 and 99  . FLEXIBLE SIGMOIDOSCOPY N/A 03/01/2017   Procedure: FLEXIBLE SIGMOIDOSCOPY;  Surgeon: Manus Gunning, MD;  Location: [facility];  Service: Gastroenterology;  Laterality: N/A;  . TONSILLECTOMY     about 62years old    SOCIAL HISTORY: Social History   Socioeconomic History  . Marital status: Widowed    Spouse name: Not on file  . Number of children: Not on file  . Years of education: Not on file  . Highest education level: Not on file  Social Needs  . Financial resource strain: Not on file  . Food insecurity - worry: Not on file  . Food insecurity - inability: Not on file  . Transportation needs - medical: Not on file  . Transportation needs - non-medical: Not on file  Occupational History  . Not on file  Tobacco Use  . Smoking status: Current Every Day Smoker    Packs/day: 1.00  Years: 33.00    Pack years: 33.00  . Smokeless tobacco: Never Used  Substance and Sexual Activity  . Alcohol use: No  . Drug use: No  . Sexual activity: Not on file    Comment: Disabled, retinis pigmentosa legally blind, 4children MPOA Lynnn(cousin)  Other Topics Concern  . Not on file  Social History Narrative  . Not on file    FAMILY HISTORY: Family History  Problem Relation Age of Onset  . Colon cancer Neg Hx     ALLERGIES:  has No Known  Allergies.  MEDICATIONS:  Current Outpatient Medications  Medication Sig Dispense Refill  . allopurinol (ZYLOPRIM) 100 MG tablet Take 1 tablet (100 mg total) by mouth 2 (two) times daily. 60 tablet 0  . Ibrutinib 560 MG TABS Take 560 mg by mouth daily. Take with a full glass of water at approximately the same time daily, maintain hydration. 28 tablet 1  . lidocaine-prilocaine (EMLA) cream Apply 1 application topically as needed. (Patient not taking: Reported on 11/06/2016) 30 g 6   No current facility-administered medications for this visit.     REVIEW OF SYSTEMS:   .10 Point review of Systems was done is negative except as noted above.  PHYSICAL EXAMINATION:  ECOG PERFORMANCE STATUS: 2 - Symptomatic, <50% confined to bed  Vitals:   11/28/17 0904  BP: 126/65  Pulse: 65  Resp: 18  Temp: (!) 97.5 F (36.4 C)  TempSrc: Oral  SpO2: 98%  Weight: 186 lb 14.4 oz (84.8 kg)  Height: 5\' 10"  (1.778 m)    . GENERAL:alert, in no acute distress and comfortable SKIN: no acute rashes, no significant lesions EYES: conjunctiva are pink and non-injected, sclera anicteric OROPHARYNX: MMM, no exudates, no oropharyngeal erythema or ulceration NECK: supple, no JVD LYMPH:  no palpable lymphadenopathy in the cervical, axillary or inguinal regions LUNGS: clear to auscultation b/l with normal respiratory effort HEART: regular rate & rhythm ABDOMEN:  normoactive bowel sounds , non tender, not distended. Extremity: trace pedal edema PSYCH: alert & oriented x 3 with fluent speech NEURO: no focal motor/sensory deficits    LABORATORY DATA:  I have reviewed the data as listed   CBC Latest Ref Rng & Units 11/28/2017 11/05/2017 10/31/2017  WBC 4.0 - 10.3 K/uL 7.4 10.7(H) 17.5(H)  Hemoglobin 13.0 - 17.1 g/dL 12.9(L) - 11.8(L)  Hematocrit 38.4 - 49.9 % 41.4 38.9 36.2(L)  Platelets 140 - 400 K/uL 148 219 245    . CMP Latest Ref Rng & Units 11/28/2017 11/05/2017 11/01/2017  Glucose 70 - 140 mg/dL  156(H) 196(H) 256(H)  BUN 7 - 26 mg/dL 8 11 13   Creatinine 0.70 - 1.30 mg/dL 0.90 0.83 0.77  Sodium 136 - 145 mmol/L 141 139 136  Potassium 3.5 - 5.1 mmol/L 3.6 3.6 4.2  Chloride 98 - 109 mmol/L 103 102 99(L)  CO2 22 - 29 mmol/L 30(H) 31(H) 30  Calcium 8.4 - 10.4 mg/dL 9.4 9.1 8.7(L)  Total Protein 6.4 - 8.3 g/dL 5.9(L) 5.8(L) 5.5(L)  Total Bilirubin 0.2 - 1.2 mg/dL 0.5 0.3 0.3  Alkaline Phos 40 - 150 U/L 72 66 65  AST 5 - 34 U/L 8 13 17   ALT 0 - 55 U/L 8 24 27       Lab Results  Component Value Date   LDH 228 11/28/2017     RADIOGRAPHIC STUDIES: I have personally reviewed the radiological images as listed and agreed with the findings in the report. Dg Chest 2 View  Result Date: 11/28/2017  CLINICAL DATA:  Pleural effusion.  History of mantle cell lymphoma. EXAM: CHEST  2 VIEW COMPARISON:  Radiographs of November 01, 2017. FINDINGS: The heart size and mediastinal contours are within normal limits. Right internal jugular Port-A-Cath is unchanged in position. No pneumothorax is noted. Right pleural effusion noted on prior exam has resolved. There is been interval development of mild left basilar atelectasis with mild left pleural effusion. The visualized skeletal structures are unremarkable. IMPRESSION: Right pleural effusion noted on prior exam has resolved. Interval development of mild left basilar subsegmental atelectasis and pleural effusion. Electronically Signed   By: Marijo Conception, M.D.   On: 11/28/2017 10:09   ASSESSMENT & PLAN:   62 yo male with   1) h/o previous Stage IVB E Mantle Cell lymphoma with likely gastric involvement, extensive LNadenopathy. ECHO nl EF ECOG PS 2 but activities significantly limited due to being legally blind HIV/Hep C/Hep B neg Baseline LDH 450--->135-->163-->144  PET/CT scan after 3 cycles of R CHOP show good overall response. Stomach lesion still with very FDG uptake ? Residual lymphoma versus inflammation.  Patient is status post 6 cycles of  R CHOP. PET/CT after 6 cycles shows resolution of hypermetabolic lymphadenopathy and gastric wall activity . Patient was on maintenance Rituxan for 1 year now prior to relapse.   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 CT chest abdomen pelvis showed multiple areas of lymphadenopathy in the abdomen as well as in the chest suggesting significant recurrence. PET/CT scan 03/25/2017 shows diffuse involvement with mantle cell lymphoma involving the chest abdomen pelvis and bowels. 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 Workup showed malignant pleural effusion and b/l pleural involvement and rectal urgency due to mantle cell lymphoma causing mass in rectum/sigmoid -PET/CT 10/25/2017 - with significant mantle cell lymphoma progression again. -10/26/17 labs showed LDH at 331.  -He was placed on Ibrutinib 560 mg p.o. Daily.   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 No prohibitive toxicities and clinical feeling better. -Discussed pt labwork today- stable ; LDH is WNL at 228 - normalized from 331 with recent relapse -repeat CXR today shows resolution of previous rt sided pleural effusion -Continue with Ibrutinib 560 mg p.o. Daily until he can no longer tolerate it or until disease progression.  -Previously, he had dry skin and pruritus but no nodules at this time. Previously recommended using moisturizer and sarna lotion.  -Advised pt to not consume raw or uncooked food and encouraged him to eat a strong and health diet -Repeat PET scan in the next 2-3 months -RTC in 4 weeks with labs  3) Retinitis pigmentosa causing legal blindness -  is seeing a retina specialist in town. No  acute interventions at this time. Notes that his vision is progressively getting worse. His daughter and granddaughter are helping him out at home.   4) Weight loss and protein calorie malnutrition.  Has gain 6 lbs since last clinic visit -- good po intake Wt Readings from Last 3 Encounters:  11/28/17 186 lb 14.4 oz (84.8 kg)  11/05/17 180 lb 9.6 oz (81.9 kg)  10/25/17 182 lb 12.2 oz (82.9 kg)   5) h/o Peri-orbital dermatitis /Conjuncitivitis - patient was seen by the dermatologist and given topical desonide ointment  which led to the resolution of his dermatitis. There were not sure about the etiology but noted then that could not rule out Rituxan as a possibility. Currently his dermatitis has resolved and his conjunctivitis has resolved. He did not have rash anywhere else at the time. No oral sores or other toxicities. This happened about 4 days after Rituxan another chemotherapy. This has completely resolved and patient has not had an further issues with this with several additional doses of Rituxan.  6) Patient Active Problem List   Diagnosis Date Noted  . Protein-calorie malnutrition, severe 10/29/2017  . 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 Large pleural effusion 10/25/2017  . Counseling regarding advanced care planning and goals of care 03/13/2017  . Rectal mass 03/02/2017  . Colon cancer metastasized to liver and lung([facility]) 03/02/2017  . Rectal bleeding 02/28/2017  . Portacath in place 01/30/2016  . Skin lesion of chest wall 12/19/2015  . Shingles 12/19/2015  . Rash 11/28/2015  . 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 Mantle cell lymphoma of lymph nodes of multiple regions ([place]) 11/01/2015  . Protein-calorie malnutrition ([place]) 11/01/2015  . Abscess of arm, right 10/27/2015  . Lymphoma of lymph nodes of multiple sites ([place]) 10/27/2015  . Shortness of breath 10/27/2015  . Shortness of breath dyspnea 10/27/2015  . Hypoalbuminemia 10/25/2015  . Elevated lipase 10/25/2015  . Inguinal  hernia 10/25/2015  . Urinary retention 10/25/2015  . Abdominal pain   . Lymphadenopathy 10/24/2015   -continue f/u with PCP for other continued medical issues.   CXR today RTC with Labs in 4 weeks with Dr Irene Limbo  . The total time spent in the appointment was 25 minutes and more than 50% was on counseling and direct patient cares.   Sullivan Lone MD [place] AAHIVMS [facility] Hematology/Oncology Physician [place]  (Office):       [phone] (Work cell):  [phone] (Fax):           [phone]  This document serves as a record of services personally performed by Sullivan Lone, MD. It was created on his behalf by Baldwin [place], a trained medical scribe. The creation of this record is based on the scribe's personal observations and the provider's statements to them.   .I have reviewed the above documentation for accuracy and completeness, and I agree with the above. Brunetta Genera MD MS

## 2017-11-28 ENCOUNTER — Ambulatory Visit (HOSPITAL_COMMUNITY)
Admission: RE | Admit: 2017-11-28 | Discharge: 2017-11-28 | Disposition: A | Discharge status: Home / Self Care | Payer: Medicare Other | Source: Ambulatory Visit | Attending: Hematology | Admitting: Hematology

## 2017-11-28 ENCOUNTER — Inpatient Hospital Stay: Payer: Medicare Other | Attending: Hematology | Admitting: Hematology

## 2017-11-28 ENCOUNTER — Inpatient Hospital Stay: Payer: Medicare Other

## 2017-11-28 VITALS — BP 126/65 | HR 65 | Temp 97.5°F | Resp 18 | Ht 70.0 in | Wt 186.9 lb

## 2017-11-28 DIAGNOSIS — H3552 Pigmentary retinal dystrophy: Secondary | ICD-10-CM | POA: Diagnosis not present

## 2017-11-28 DIAGNOSIS — J9811 Atelectasis: Secondary | ICD-10-CM | POA: Insufficient documentation

## 2017-11-28 DIAGNOSIS — F17210 Nicotine dependence, cigarettes, uncomplicated: Secondary | ICD-10-CM | POA: Insufficient documentation

## 2017-11-28 DIAGNOSIS — J90 Pleural effusion, not elsewhere classified: Secondary | ICD-10-CM | POA: Insufficient documentation

## 2017-11-28 DIAGNOSIS — Z79899 Other long term (current) drug therapy: Secondary | ICD-10-CM | POA: Insufficient documentation

## 2017-11-28 DIAGNOSIS — E46 Unspecified protein-calorie malnutrition: Secondary | ICD-10-CM | POA: Diagnosis not present

## 2017-11-28 DIAGNOSIS — H548 Legal blindness, as defined in USA: Secondary | ICD-10-CM

## 2017-11-28 DIAGNOSIS — Z85038 Personal history of other malignant neoplasm of large intestine: Secondary | ICD-10-CM | POA: Insufficient documentation

## 2017-11-28 DIAGNOSIS — Z8619 Personal history of other infectious and parasitic diseases: Secondary | ICD-10-CM | POA: Insufficient documentation

## 2017-11-28 DIAGNOSIS — C8318 Mantle cell lymphoma, lymph nodes of multiple sites: Secondary | ICD-10-CM | POA: Diagnosis not present

## 2017-11-28 DIAGNOSIS — J910 Malignant pleural effusion: Secondary | ICD-10-CM | POA: Insufficient documentation

## 2017-11-28 LAB — RETICULOCYTES
RBC.: 4.44 MIL/uL (ref 4.20–5.82)
RETIC COUNT ABSOLUTE: 106.6 10*3/uL — AB (ref 34.8–93.9)
RETIC CT PCT: 2.4 % — AB (ref 0.8–1.8)

## 2017-11-28 LAB — CBC WITH DIFFERENTIAL/PLATELET
BASOS ABS: 0.1 10*3/uL (ref 0.0–0.1)
BASOS PCT: 1 %
EOS ABS: 0.2 10*3/uL (ref 0.0–0.5)
Eosinophils Relative: 3 %
HCT: 41.4 % (ref 38.4–49.9)
Hemoglobin: 12.9 g/dL — ABNORMAL LOW (ref 13.0–17.1)
Lymphocytes Relative: 8 %
Lymphs Abs: 0.6 10*3/uL — ABNORMAL LOW (ref 0.9–3.3)
MCH: 29.1 pg (ref 27.2–33.4)
MCHC: 31.2 g/dL — ABNORMAL LOW (ref 32.0–36.0)
MCV: 93.2 fL (ref 79.3–98.0)
Monocytes Absolute: 1 10*3/uL — ABNORMAL HIGH (ref 0.1–0.9)
Monocytes Relative: 13 %
NEUTROS PCT: 75 %
NRBC: 0 /100{WBCs}
Neutro Abs: 5.5 10*3/uL (ref 1.5–6.5)
Platelets: 148 10*3/uL (ref 140–400)
RBC: 4.44 MIL/uL (ref 4.20–5.82)
RDW: 15.8 % — ABNORMAL HIGH (ref 11.0–14.6)
WBC: 7.4 10*3/uL (ref 4.0–10.3)

## 2017-11-28 LAB — CMP (CANCER CENTER ONLY)
ALK PHOS: 72 U/L (ref 40–150)
ALT: 8 U/L (ref 0–55)
AST: 8 U/L (ref 5–34)
Albumin: 3.3 g/dL — ABNORMAL LOW (ref 3.5–5.0)
Anion gap: 8 (ref 3–11)
BILIRUBIN TOTAL: 0.5 mg/dL (ref 0.2–1.2)
BUN: 8 mg/dL (ref 7–26)
CALCIUM: 9.4 mg/dL (ref 8.4–10.4)
CO2: 30 mmol/L — ABNORMAL HIGH (ref 22–29)
Chloride: 103 mmol/L (ref 98–109)
Creatinine: 0.9 mg/dL (ref 0.70–1.30)
GFR, Estimated: >60 mL/min (ref >60)
Glucose, Bld: 156 mg/dL — ABNORMAL HIGH (ref 70–140)
Potassium: 3.6 mmol/L (ref 3.5–5.1)
SODIUM: 141 mmol/L (ref 136–145)
TOTAL PROTEIN: 5.9 g/dL — AB (ref 6.4–8.3)

## 2017-11-28 LAB — LACTATE DEHYDROGENASE: LDH: 228 U/L (ref 125–245)

## 2017-11-28 MED ORDER — IBRUTINIB 560 MG PO TABS: 560.0000 mg | ORAL_TABLET | Freq: Every day | ORAL | 3 refills | Status: DC

## 2017-12-18 MED FILL — IMBRUVICA 560 MG TAB: 560 | 28 days supply | Qty: 28 | Fill #0

## 2017-12-25 NOTE — Progress Notes (Signed)
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 Patient chooses not to pursue AutoHSCT based strategies which is quite reasonable considering his functional challenges with legal blindness and limited social support.  S/p 6 cycles of Bendamustine/Rituxan    HISTORY OF PRESENTING ILLNESS: plz see clinic note for details on initial presentation  Interval History:   Kristopher Johnson presents today for follow up for his mantle cell lymphoma. The patient's last visit with [place] was on 11/28/17. He is accompanied today by her daughter. The pt reports that he is doing well overall and has been working on his new house.   The pt reports that he has gained 8 lbs of weight. He notes that he had a non-itchy rash in his groin that was irritated by showers, which has resolved.   CXR on 11/28/17 revealed Right pleural effusion noted on prior exam has resolved. Interval development of mild left basilar subsegmental atelectasis and pleural effusion.   Lab results today (12/26/17) of CBC and Reticulocytes is as follows: all values are WNL except for MCHC at 31.1, RDW at 16.3, Lymphs Abs at 0.5k. LDH and CMP labs 12/26/17 are pending.   On review of systems, pt reports minimal persisting night sweats, desired weight gain, and denies bowel irregularities, fevers, chills, loss of appetite, SOB, CP, mouth sores, noticing lumps or bumps, pain, stiffness, abdominal pains, testicular pain or swelling, leg swelling, and any other symptoms.    PAST MEDICAL HISTORY:  Past Medical History:  Diagnosis Date   . Abscess of arm, right 10/27/2015  . Lymphoma of lymph nodes of multiple sites ([place]) 10/27/2015  . 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 Shortness of breath 10/27/2015    SURGICAL HISTORY: Past Surgical History:  Procedure Laterality Date  . Cataract surgery     Patient notes she has had bilateral Surgery in 1998 and 99  . FLEXIBLE SIGMOIDOSCOPY N/A 03/01/2017   Procedure: FLEXIBLE SIGMOIDOSCOPY;  Surgeon: Manus Gunning, MD;  Location: [facility];  Service: Gastroenterology;  Laterality: N/A;  . TONSILLECTOMY     about 62years old    SOCIAL HISTORY: Social History   Socioeconomic History  . Marital status: Widowed    Spouse name: Not on file  . Number of children: Not on file  . Years of education: Not on file  . Highest education level: Not on file  Occupational History  . Not on file  Social Needs  . Financial resource strain: Not on file  . Food insecurity:    Worry: Not on file    Inability: Not on file  . Transportation needs:    Medical: Not on file    Non-medical: Not on file  Tobacco Use  . Smoking status: Current Every Day Smoker    Packs/day: 1.00    Years: 33.00    Pack years: 33.00  . Smokeless tobacco: Never Used  Substance and Sexual Activity  . Alcohol use: No  . Drug use: No  . Sexual activity: Not on file  Comment: Disabled, retinis pigmentosa legally blind, 4children MPOA Lynnn(cousin)  Lifestyle  . Physical activity:    Days per week: Not on file    Minutes per session: Not on file  . Stress: Not on file  Relationships  . Social connections:    Talks on phone: Not on file    Gets together: Not on file    Attends religious service: Not on file    Active member of club or organization: Not on file    Attends meetings of clubs or organizations: Not on file    Relationship status: Not on file  . Intimate partner violence:    Fear of  current or ex partner: Not on file    Emotionally abused: Not on file    Physically abused: Not on file    Forced sexual activity: Not on file  Other Topics Concern  . Not on file  Social History Narrative  . Not on file    FAMILY HISTORY: Family History  Problem Relation Age of Onset  . Colon cancer Neg Hx     ALLERGIES:  has No Known Allergies.  MEDICATIONS:  Current Outpatient Medications  Medication Sig Dispense Refill  . allopurinol (ZYLOPRIM) 100 MG tablet Take 1 tablet (100 mg total) by mouth 2 (two) times daily. 60 tablet 0  . Ibrutinib 560 MG TABS Take 560 mg by mouth daily. Take with a full glass of water at approximately the same time daily, maintain hydration. 28 tablet 3  . lidocaine-prilocaine (EMLA) cream Apply 1 application topically as needed. (Patient not taking: Reported on 11/06/2016) 30 g 6   No current facility-administered medications for this visit.     REVIEW OF SYSTEMS:   .10 Point review of Systems was done is negative except as noted above.   PHYSICAL EXAMINATION:  ECOG PERFORMANCE STATUS: 2 - Symptomatic, <50% confined to bed  Vitals:   12/26/17 1047  BP: 118/75  Pulse: 61  Resp: 18  Temp: 97.6 F (36.4 C)  TempSrc: Oral  SpO2: 99%  Weight: 192 lb 9.6 oz (87.4 kg)  Height: 5\' 10"  (1.778 m)  . GENERAL:alert, in no acute distress and comfortable SKIN: no acute rashes, no significant lesions EYES: conjunctiva are pink and non-injected, sclera anicteric OROPHARYNX: MMM, no exudates, no oropharyngeal erythema or ulceration NECK: supple, no JVD LYMPH:  no palpable lymphadenopathy in the cervical, axillary or inguinal regions LUNGS: clear to auscultation b/l with normal respiratory effort HEART: regular rate & rhythm ABDOMEN:  normoactive bowel sounds , non tender, not distended. Extremity: no pedal edema PSYCH: alert & oriented x 3 with fluent speech NEURO: no focal motor/sensory deficits   LABORATORY DATA:  I have reviewed the data  as listed   CBC Latest Ref Rng & Units 12/26/2017 11/28/2017 11/05/2017  WBC 4.0 - 10.3 K/uL 7.8 7.4 10.7(H)  Hemoglobin 13.0 - 17.1 g/dL - 12.9(L) -  Hematocrit 38.4 - 49.9 % 45.0 41.4 38.9  Platelets 140 - 400 K/uL 146 148 219  HGB 14  . CMP Latest Ref Rng & Units 12/26/2017 11/28/2017 11/05/2017  Glucose 70 - 140 mg/dL 156(H) 156(H) 196(H)  BUN 7 - 26 mg/dL 9 8 11   Creatinine 0.70 - 1.30 mg/dL 0.95 0.90 0.83  Sodium 136 - 145 mmol/L 140 141 139  Potassium 3.5 - 5.1 mmol/L 3.4(L) 3.6 3.6  Chloride 98 - 109 mmol/L 104 103 102  CO2 22 - 29 mmol/L 29 30(H) 31(H)  Calcium 8.4 - 10.4 mg/dL 9.3 9.4 9.1  Total Protein 6.4 - 8.3 g/dL 6.2(L) 5.9(L) 5.8(L)  Total Bilirubin 0.2 - 1.2 mg/dL 0.7 0.5 0.3  Alkaline Phos 40 - 150 U/L 71 72 66  AST 5 - 34 U/L 8 8 13   ALT 0 - 55 U/L 7 8 24       Lab Results  Component Value Date   LDH 132 12/26/2017     RADIOGRAPHIC STUDIES: I have personally reviewed the radiological images as listed and agreed with the findings in the report. No results found.   ASSESSMENT & PLAN:   62 yo male with   1) h/o previous Stage IVB E Mantle Cell lymphoma with likely gastric involvement, extensive LNadenopathy. ECHO nl EF ECOG PS 2 but activities significantly limited due to being legally blind HIV/Hep C/Hep B neg Baseline LDH 450--->135-->163-->144  PET/CT scan after 3 cycles of R CHOP show good overall response. Stomach lesion still with very FDG uptake ? Residual lymphoma versus inflammation.  Patient is status post 6 cycles of R CHOP. PET/CT after 6 cycles shows resolution of hypermetabolic lymphadenopathy and gastric wall activity . Patient was on maintenance Rituxan for 1 year now prior to relapse.   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 CT chest abdomen pelvis showed multiple areas of lymphadenopathy in the abdomen  as well as in the chest suggesting significant recurrence. PET/CT scan 03/25/2017 shows diffuse involvement with mantle cell lymphoma involving the chest abdomen pelvis and bowels. 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 Workup showed malignant pleural effusion and b/l pleural involvement and rectal urgency due to mantle cell lymphoma causing mass in rectum/sigmoid -PET/CT 10/25/2017 - with significant mantle cell lymphoma progression again. -He was placed on Ibrutinib 560 mg p.o. Daily in 10/2017 and has resolution of most of his symptoms and normalization of elevated LDH levels. . 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 -repeat CXR 11/28/17 shows resolution of previous rt sided pleural effusion PLAN:  -patient appears to be tolerating his Ibrutinib without any acute issues. -No prohibitive toxicities and clinically feeling better -Discussed pt labwork today 12/26/17; WBC WNL at 7.8k, Hgb WNL at 14.0. And normalized LDH levels. -Will repeat PET scan in one 4 weeks and RTC in 6 weeks.  -Continue with Ibrutinib 560 mg p.o. Daily until he can no longer tolerate it or until disease progression.  -Previously, he had dry skin and pruritus but no nodules at this time. Previously recommended using moisturizer and sarna lotion.  -Advised pt to not consume raw or uncooked food and encouraged him to eat a strong and health diet  3) Retinitis pigmentosa causing legal blindness -  is seeing a retina specialist in town. No acute interventions at this time. Notes that his vision is progressively getting worse. His daughter and granddaughter are helping him out at home.   4) Weight loss and protein calorie malnutrition.  Has gain 6 lbs since last clinic visit -- good po intake Wt Readings from Last 3  Encounters:  11/28/17 186 lb 14.4 oz (84.8 kg)  11/05/17 180 lb 9.6 oz (81.9 kg)  10/25/17 182 lb 12.2 oz (82.9 kg)   5) h/o Peri-orbital dermatitis /Conjuncitivitis - patient was seen by the dermatologist and given topical desonide  ointment which led to the resolution of his dermatitis. There were not sure about the etiology but noted then that could not rule out Rituxan as a possibility. Currently his dermatitis has resolved and his conjunctivitis has resolved. He did not have rash anywhere else at the time. No oral sores or other toxicities. This happened about 4 days after Rituxan another chemotherapy. This has completely resolved and patient has not had an further issues with this with several additional doses of Rituxan.  6) Patient Active Problem List   Diagnosis Date Noted  . Protein-calorie malnutrition, severe 10/29/2017  . 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 Large pleural effusion 10/25/2017  . Counseling regarding advanced care planning and goals of care 03/13/2017  . Rectal mass 03/02/2017  . Colon cancer metastasized to liver and lung([facility]) 03/02/2017  . Rectal bleeding 02/28/2017  . Portacath in place 01/30/2016  . Skin lesion of chest wall 12/19/2015  . Shingles 12/19/2015  . Rash 11/28/2015  . 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 Mantle cell lymphoma of lymph nodes of multiple regions ([place]) 11/01/2015  . Protein-calorie malnutrition ([place]) 11/01/2015  . Abscess of arm, right 10/27/2015  . Lymphoma of lymph nodes of multiple sites ([place]) 10/27/2015  . Shortness of breath 10/27/2015  . Shortness of breath dyspnea 10/27/2015  . Hypoalbuminemia 10/25/2015  . Elevated lipase 10/25/2015  . Inguinal hernia 10/25/2015  . Urinary retention 10/25/2015  . Abdominal pain   . Lymphadenopathy 10/24/2015   -continue f/u with PCP for other continued medical issues.   PET/CT in 5 weeks RTC with Dr Irene Limbo in 6 weeks (1st week of May) with labs   . The total time spent in the  appointment was 25 minutes and more than 50% was on counseling and direct patient cares.     Sullivan Lone MD [place] AAHIVMS [facility] Hematology/Oncology Physician [place]  (Office):       [phone] (Work cell):  [phone] (Fax):           [phone]  This document serves as a record of services personally performed by Sullivan Lone, MD. It was created on his behalf by Baldwin [place], a trained medical scribe. The creation of this record is based on the scribe's personal observations and the provider's statements to them.   .I have reviewed the above documentation for accuracy and completeness, and I agree with the above.  Brunetta Genera MD MS

## 2017-12-26 ENCOUNTER — Inpatient Hospital Stay: Payer: Medicare Other | Attending: Hematology | Admitting: Hematology

## 2017-12-26 ENCOUNTER — Encounter: Payer: Self-pay | Admitting: Hematology

## 2017-12-26 ENCOUNTER — Inpatient Hospital Stay: Payer: Medicare Other

## 2017-12-26 VITALS — BP 118/75 | HR 61 | Temp 97.6°F | Resp 18 | Ht 70.0 in | Wt 192.6 lb

## 2017-12-26 DIAGNOSIS — H548 Legal blindness, as defined in USA: Secondary | ICD-10-CM | POA: Diagnosis not present

## 2017-12-26 DIAGNOSIS — R634 Abnormal weight loss: Secondary | ICD-10-CM | POA: Diagnosis not present

## 2017-12-26 DIAGNOSIS — C8318 Mantle cell lymphoma, lymph nodes of multiple sites: Secondary | ICD-10-CM

## 2017-12-26 DIAGNOSIS — F17210 Nicotine dependence, cigarettes, uncomplicated: Secondary | ICD-10-CM | POA: Diagnosis not present

## 2017-12-26 DIAGNOSIS — E43 Unspecified severe protein-calorie malnutrition: Secondary | ICD-10-CM

## 2017-12-26 DIAGNOSIS — E46 Unspecified protein-calorie malnutrition: Secondary | ICD-10-CM | POA: Diagnosis not present

## 2017-12-26 DIAGNOSIS — L02413 Cutaneous abscess of right upper limb: Secondary | ICD-10-CM | POA: Diagnosis not present

## 2017-12-26 DIAGNOSIS — C189 Malignant neoplasm of colon, unspecified: Secondary | ICD-10-CM

## 2017-12-26 DIAGNOSIS — H3552 Pigmentary retinal dystrophy: Secondary | ICD-10-CM | POA: Insufficient documentation

## 2017-12-26 DIAGNOSIS — R0602 Shortness of breath: Secondary | ICD-10-CM

## 2017-12-26 DIAGNOSIS — C787 Secondary malignant neoplasm of liver and intrahepatic bile duct: Secondary | ICD-10-CM

## 2017-12-26 LAB — CMP (CANCER CENTER ONLY)
ALT: 7 U/L (ref 0–55)
AST: 8 U/L (ref 5–34)
Albumin: 3.6 g/dL (ref 3.5–5.0)
Alkaline Phosphatase: 71 U/L (ref 40–150)
Anion gap: 7 (ref 3–11)
BUN: 9 mg/dL (ref 7–26)
CO2: 29 mmol/L (ref 22–29)
Calcium: 9.3 mg/dL (ref 8.4–10.4)
Chloride: 104 mmol/L (ref 98–109)
Creatinine: 0.95 mg/dL (ref 0.70–1.30)
GFR, Est AFR Am: >60 mL/min (ref >60)
GFR, Estimated: >60 mL/min (ref >60)
Glucose, Bld: 156 mg/dL — ABNORMAL HIGH (ref 70–140)
Potassium: 3.4 mmol/L — ABNORMAL LOW (ref 3.5–5.1)
Sodium: 140 mmol/L (ref 136–145)
Total Bilirubin: 0.7 mg/dL (ref 0.2–1.2)
Total Protein: 6.2 g/dL — ABNORMAL LOW (ref 6.4–8.3)

## 2017-12-26 LAB — CBC WITH DIFFERENTIAL (CANCER CENTER ONLY)
BASOS ABS: 0.1 10*3/uL (ref 0.0–0.1)
BASOS PCT: 1 %
Eosinophils Absolute: 0.3 10*3/uL (ref 0.0–0.5)
Eosinophils Relative: 3 %
HEMATOCRIT: 45 % (ref 38.4–49.9)
HEMOGLOBIN: 14 g/dL (ref 13.0–17.1)
LYMPHS PCT: 7 %
Lymphs Abs: 0.5 10*3/uL — ABNORMAL LOW (ref 0.9–3.3)
MCH: 29.2 pg (ref 27.2–33.4)
MCHC: 31.1 g/dL — ABNORMAL LOW (ref 32.0–36.0)
MCV: 93.9 fL (ref 79.3–98.0)
Monocytes Absolute: 0.9 10*3/uL (ref 0.1–0.9)
Monocytes Relative: 12 %
NEUTROS ABS: 6 10*3/uL (ref 1.5–6.5)
NEUTROS PCT: 77 %
Platelet Count: 146 10*3/uL (ref 140–400)
RBC: 4.79 MIL/uL (ref 4.20–5.82)
RDW: 16.3 % — ABNORMAL HIGH (ref 11.0–14.6)
WBC Count: 7.8 10*3/uL (ref 4.0–10.3)

## 2017-12-26 LAB — LACTATE DEHYDROGENASE: LDH: 132 U/L (ref 125–245)

## 2017-12-26 LAB — RETICULOCYTES
RBC.: 4.79 MIL/uL (ref 4.20–5.82)
Retic Count, Absolute: 76.6 10*3/uL (ref 34.8–93.9)
Retic Ct Pct: 1.6 % (ref 0.8–1.8)

## 2017-12-26 NOTE — Patient Instructions (Signed)
Thank you for choosing [facility] to provide your oncology and hematology care.  To afford each patient quality time with our providers, please arrive 30 minutes before your scheduled appointment time.  If you arrive late for your appointment, you may be asked to reschedule.  We strive to give you quality time with our providers, and arriving late affects you and other patients whose appointments are after yours.   If you are a no show for multiple scheduled visits, you may be dismissed from the clinic at the providers discretion.    Again, thank you for choosing [facility], our hope is that these requests will decrease the amount of time that you wait before being seen by our physicians.  ______________________________________________________________________  Should you have questions after your visit to the [facility], please contact our office at [phone] between the hours of 8:30 and 4:30 p.m.    Voicemails left after 4:30p.m will not be returned until the following business day.    For prescription refill requests, please have your pharmacy contact us directly.  Please also try to allow 48 hours for prescription requests.    Please contact the scheduling department for questions regarding scheduling.  For scheduling of procedures such as PET scans, CT scans, MRI, Ultrasound, etc please contact central scheduling at [phone].    Resources For Cancer Patients and Caregivers:   Oncolink.org:  A wonderful resource for patients and healthcare providers for information regarding your disease, ways to tract your treatment, what to expect, etc.     American Cancer Society:  800-227-2345  Can help patients locate various types of support and financial assistance  Cancer Care: 1-800-813-HOPE (4673) Provides financial assistance, online support groups, medication/co-pay assistance.    [place] DSS:  336-641-3447 Where to apply for food  stamps, Medicaid, and utility assistance  Medicare Rights Center: 800-333-4114 Helps people with Medicare understand their rights and benefits, navigate the Medicare system, and secure the quality healthcare they deserve  SCAT: 336-333-6589 [place] [employer]'s shared-ride transportation service for eligible riders who have a disability that prevents them from riding the fixed route bus.    For additional information on assistance programs please contact our social worker:   Grier Hock/Abigail [place]:  [phone]            

## 2018-01-16 MED FILL — IMBRUVICA 560 MG TAB: 560 | 28 days supply | Qty: 28 | Fill #1

## 2018-01-27 ENCOUNTER — Inpatient Hospital Stay (HOSPITAL_COMMUNITY): Payer: Medicare Other

## 2018-01-27 ENCOUNTER — Emergency Department (HOSPITAL_COMMUNITY): Payer: Medicare Other

## 2018-01-27 ENCOUNTER — Other Ambulatory Visit: Payer: Self-pay

## 2018-01-27 ENCOUNTER — Encounter (HOSPITAL_COMMUNITY): Payer: Self-pay | Admitting: Emergency Medicine

## 2018-01-27 ENCOUNTER — Inpatient Hospital Stay (HOSPITAL_COMMUNITY)
Admission: EM | Admit: 2018-01-27 | Discharge: 2018-01-30 | DRG: 840 | Disposition: A | Discharge status: Home / Self Care | Payer: Medicare Other | Attending: Internal Medicine | Admitting: Internal Medicine

## 2018-01-27 DIAGNOSIS — C8310 Mantle cell lymphoma, unspecified site: Secondary | ICD-10-CM | POA: Diagnosis present

## 2018-01-27 DIAGNOSIS — H3552 Pigmentary retinal dystrophy: Secondary | ICD-10-CM | POA: Diagnosis present

## 2018-01-27 DIAGNOSIS — J910 Malignant pleural effusion: Secondary | ICD-10-CM | POA: Diagnosis not present

## 2018-01-27 DIAGNOSIS — F17210 Nicotine dependence, cigarettes, uncomplicated: Secondary | ICD-10-CM | POA: Diagnosis present

## 2018-01-27 DIAGNOSIS — R0602 Shortness of breath: Secondary | ICD-10-CM | POA: Diagnosis present

## 2018-01-27 DIAGNOSIS — E876 Hypokalemia: Secondary | ICD-10-CM | POA: Diagnosis not present

## 2018-01-27 DIAGNOSIS — Z9221 Personal history of antineoplastic chemotherapy: Secondary | ICD-10-CM | POA: Diagnosis not present

## 2018-01-27 DIAGNOSIS — C8319 Mantle cell lymphoma, extranodal and solid organ sites: Secondary | ICD-10-CM | POA: Diagnosis not present

## 2018-01-27 DIAGNOSIS — Z79899 Other long term (current) drug therapy: Secondary | ICD-10-CM

## 2018-01-27 DIAGNOSIS — J9811 Atelectasis: Secondary | ICD-10-CM | POA: Diagnosis present

## 2018-01-27 DIAGNOSIS — E43 Unspecified severe protein-calorie malnutrition: Secondary | ICD-10-CM | POA: Diagnosis present

## 2018-01-27 DIAGNOSIS — I517 Cardiomegaly: Secondary | ICD-10-CM | POA: Diagnosis not present

## 2018-01-27 DIAGNOSIS — Z9889 Other specified postprocedural states: Secondary | ICD-10-CM

## 2018-01-27 DIAGNOSIS — J90 Pleural effusion, not elsewhere classified: Secondary | ICD-10-CM

## 2018-01-27 DIAGNOSIS — Z6828 Body mass index (BMI) 28.0-28.9, adult: Secondary | ICD-10-CM | POA: Diagnosis not present

## 2018-01-27 LAB — COMPREHENSIVE METABOLIC PANEL
ALK PHOS: 72 U/L (ref 38–126)
ALT: 13 U/L — AB (ref 17–63)
AST: 15 U/L (ref 15–41)
Albumin: 3.6 g/dL (ref 3.5–5.0)
Anion gap: 8 (ref 5–15)
BILIRUBIN TOTAL: 0.6 mg/dL (ref 0.3–1.2)
BUN: 9 mg/dL (ref 6–20)
CALCIUM: 8.7 mg/dL — AB (ref 8.9–10.3)
CHLORIDE: 107 mmol/L (ref 101–111)
CO2: 28 mmol/L (ref 22–32)
CREATININE: 0.95 mg/dL (ref 0.61–1.24)
Glucose, Bld: 241 mg/dL — ABNORMAL HIGH (ref 65–99)
Potassium: 3.3 mmol/L — ABNORMAL LOW (ref 3.5–5.1)
Sodium: 143 mmol/L (ref 135–145)
TOTAL PROTEIN: 6.3 g/dL — AB (ref 6.5–8.1)

## 2018-01-27 LAB — CBC WITH DIFFERENTIAL/PLATELET
Basophils Absolute: 0.1 10*3/uL (ref 0.0–0.1)
Basophils Relative: 2 %
EOS PCT: 2 %
Eosinophils Absolute: 0.1 10*3/uL (ref 0.0–0.7)
HCT: 44.1 % (ref 39.0–52.0)
Hemoglobin: 13.9 g/dL (ref 13.0–17.0)
LYMPHS ABS: 0.6 10*3/uL — AB (ref 0.7–4.0)
LYMPHS PCT: 9 %
MCH: 29.3 pg (ref 26.0–34.0)
MCHC: 31.5 g/dL (ref 30.0–36.0)
MCV: 92.8 fL (ref 78.0–100.0)
Monocytes Absolute: 0.3 10*3/uL (ref 0.1–1.0)
Monocytes Relative: 4 %
Neutro Abs: 5.4 10*3/uL (ref 1.7–7.7)
Neutrophils Relative %: 83 %
PLATELETS: 91 10*3/uL — AB (ref 150–400)
RBC: 4.75 MIL/uL (ref 4.22–5.81)
RDW: 15.3 % (ref 11.5–15.5)
WBC: 6.5 10*3/uL (ref 4.0–10.5)

## 2018-01-27 LAB — TROPONIN I: Troponin I: <0.03 ng/mL (ref <0.03)

## 2018-01-27 LAB — PROTEIN, PLEURAL OR PERITONEAL FLUID: Total protein, fluid: 3 g/dL

## 2018-01-27 LAB — MAGNESIUM: Magnesium: 1.8 mg/dL (ref 1.7–2.4)

## 2018-01-27 LAB — LACTATE DEHYDROGENASE, PLEURAL OR PERITONEAL FLUID: LD, Fluid: 297 U/L — ABNORMAL HIGH (ref 3–23)

## 2018-01-27 LAB — PROTIME-INR
INR: 0.95
PROTHROMBIN TIME: 12.6 s (ref 11.4–15.2)

## 2018-01-27 LAB — BRAIN NATRIURETIC PEPTIDE: B Natriuretic Peptide: 5.4 pg/mL (ref 0.0–100.0)

## 2018-01-27 MED ORDER — LEVALBUTEROL HCL 0.63 MG/3ML IN NEBU: 0.6300 mg | INHALATION_SOLUTION | Freq: Four times a day (QID) | RESPIRATORY_TRACT | Status: DC | PRN

## 2018-01-27 MED ORDER — SODIUM CHLORIDE 0.9% FLUSH
3.0000 mL | Freq: Two times a day (BID) | INTRAVENOUS | Status: DC
  Administered 2018-01-29 (×2): 3 mL via INTRAVENOUS

## 2018-01-27 MED ORDER — SODIUM CHLORIDE 0.9% FLUSH
10.0000 mL | INTRAVENOUS | Status: DC | PRN
  Administered 2018-01-29 – 2018-01-30 (×2): 10 mL
  Filled 2018-01-27 (×2): qty 40

## 2018-01-27 MED ORDER — ALBUTEROL SULFATE (2.5 MG/3ML) 0.083% IN NEBU
5.0000 mg | INHALATION_SOLUTION | Freq: Once | RESPIRATORY_TRACT | Status: AC
  Administered 2018-01-27: 5 mg via RESPIRATORY_TRACT
  Filled 2018-01-27: qty 6

## 2018-01-27 MED ORDER — ONDANSETRON HCL 4 MG/2ML IJ SOLN: 4.0000 mg | Freq: Four times a day (QID) | INTRAMUSCULAR | Status: DC | PRN

## 2018-01-27 MED ORDER — POTASSIUM CHLORIDE CRYS ER 20 MEQ PO TBCR
40.0000 meq | EXTENDED_RELEASE_TABLET | Freq: Once | ORAL | Status: AC
  Administered 2018-01-27: 40 meq via ORAL
  Filled 2018-01-27: qty 2

## 2018-01-27 MED ORDER — IBRUTINIB 560 MG PO TABS: 560.0000 mg | ORAL_TABLET | Freq: Every day | ORAL | Status: DC

## 2018-01-27 MED ORDER — SODIUM CHLORIDE 0.9% FLUSH: 3.0000 mL | INTRAVENOUS | Status: DC | PRN

## 2018-01-27 MED ORDER — ONDANSETRON HCL 4 MG PO TABS: 4.0000 mg | ORAL_TABLET | Freq: Four times a day (QID) | ORAL | Status: DC | PRN

## 2018-01-27 MED ORDER — ENOXAPARIN SODIUM 40 MG/0.4ML ~~LOC~~ SOLN
40.0000 mg | SUBCUTANEOUS | Status: DC
  Filled 2018-01-27: qty 0.4

## 2018-01-27 MED ORDER — LIDOCAINE HCL 1 % IJ SOLN
INTRAMUSCULAR | Status: AC
  Filled 2018-01-27: qty 20

## 2018-01-27 MED ORDER — ACETAMINOPHEN 650 MG RE SUPP: 650.0000 mg | Freq: Four times a day (QID) | RECTAL | Status: DC | PRN

## 2018-01-27 MED ORDER — ACETAMINOPHEN 325 MG PO TABS: 650.0000 mg | ORAL_TABLET | Freq: Four times a day (QID) | ORAL | Status: DC | PRN

## 2018-01-27 MED ORDER — SODIUM CHLORIDE 0.9 % IV SOLN: 250.0000 mL | INTRAVENOUS | Status: DC | PRN

## 2018-01-27 NOTE — H&P (Signed)
[facility] History and Physical  [name] IOE:[id number] DOB: November 09, 1955 DOA: 01/27/2018  Referring physician:   PCP: Kristopher Johnson, No Pcp Per   Chief Complaint:  Shortness of breath  HPI:   62 year old male with a history of stage for mantle cell lymphoma diagnosed in January 2017 currently on  Ibrutinib 560 mg daily , followed by Dr. Irene Limbo , last seen by him on 12/26/17, pending an outpatient CAT scan , who presents to the ED with shortness of breath onset 4 days ago, progressively getting worse, unable to go about activities of daily living. Kristopher Johnson is seen in the ED sitting in a tripod position, unable to lay flat.Denies any fever chills rigors. Denies any associated palpitations syncopal or near syncopal episodes. He denies any significant dependent edema Kristopher Johnson admits to smoking a pack a day. Denies any significant   weight gain , in fact has been losing weight. Most recent malignant pleural effusion due to mantle cell lymphoma resolved after being started on Ibrutinib treatment Kristopher Johnson states that he has been compliant with his chemotherapy medication. He is waiting on a repeat PET scan , currently pending. ED course Blood pressure 137/83, pulse 89, respirations 19, temperature 97.8 Potassium 3.3, EKG shows sinus rhythm, chest x-ray shows increased size of large left pleural effusion with compressive atelectasis Kristopher Johnson is being admitted for urgent thoracentesis. I have contacted oncology        Review of Systems: negative for the following   complete review of systems was done with pertinent positives as documented in history of present illness     Past Medical History:  Diagnosis Date  . Abscess of arm, right 10/27/2015  . Lymphoma of lymph nodes of multiple sites ([place]) 10/27/2015  . Retinitis pigmentosa    Kristopher Johnson notes that he has been declared legally blind since 1983 and is on Social Security disability  . Shortness of breath 10/27/2015     Past Surgical  History:  Procedure Laterality Date  . Cataract surgery     Kristopher Johnson notes she has had bilateral Surgery in 1998 and 99  . FLEXIBLE SIGMOIDOSCOPY N/A 03/01/2017   Procedure: FLEXIBLE SIGMOIDOSCOPY;  Surgeon: Manus Gunning, MD;  Location: [facility];  Service: Gastroenterology;  Laterality: N/A;  . TONSILLECTOMY     about 62years old      Social History:  reports that he has been smoking cigarettes.  He has a 33.00 pack-year smoking history. He has never used smokeless tobacco. He reports that he does not drink alcohol or use drugs.   No Known Allergies  Family History  Problem Relation Age of Onset  . Colon cancer Neg Hx        Prior to Admission medications   Medication Sig Start Date End Date Taking? Authorizing Provider  Ibrutinib 560 MG TABS Take 560 mg by mouth daily. Take with a full glass of water at approximately the same time daily, maintain hydration. 11/28/17  Yes Brunetta Genera, MD  lidocaine-prilocaine (EMLA) cream Apply 1 application topically as needed. Kristopher Johnson not taking: Reported on 01/27/2018 11/15/15   Brunetta Genera, MD  prochlorperazine (COMPAZINE) 10 MG tablet Take 1 tablet (10 mg total) by mouth every 6 (six) hours as needed (Nausea or vomiting). 03/13/17 03/13/17  Brunetta Genera, MD     Physical Exam: Vitals:   01/27/18 1400 01/27/18 1430 01/27/18 1500 01/27/18 1532  BP: (!) 148/72 (!) 147/77 137/79 (!) 130/91  Pulse: 94 86 95 89  Resp: (!) 23 (!) 21  19 15  Temp:      TempSrc:      SpO2: 92% 92% 95% 96%  Weight:      Height:            Vitals:   01/27/18 1400 01/27/18 1430 01/27/18 1500 01/27/18 1532  BP: (!) 148/72 (!) 147/77 137/79 (!) 130/91  Pulse: 94 86 95 89  Resp: (!) 23 (!) 21 19 15   Temp:      TempSrc:      SpO2: 92% 92% 95% 96%  Weight:      Height:       Constitutional: NAD, calm, comfortable Eyes: PERRL, lids and conjunctivae normal ENMT: Mucous membranes are moist. Posterior pharynx clear of any  exudate or lesions.Normal dentition.  Neck: normal, supple, no masses, no thyromegaly Respiratory: decreased breath sounds at the left base, no wheezing, no crackles. Normal respiratory effort. No accessory muscle use.  Cardiovascular: Regular rate and rhythm, no murmurs / rubs / gallops. No extremity edema. 2+ pedal pulses. No carotid bruits.  Abdomen: no tenderness, no masses palpated. No hepatosplenomegaly. Bowel sounds positive.  Musculoskeletal: no clubbing / cyanosis. No joint deformity upper and lower extremities. Good ROM, no contractures. Normal muscle tone.  Skin: no rashes, lesions, ulcers. No induration Neurologic: CN 2-12 grossly intact. Sensation intact, DTR normal. Strength 5/5 in all 4.  Psychiatric: Normal judgment and insight. Alert and oriented x 3. Normal mood.     Labs on Admission: I have personally reviewed following labs and imaging studies  CBC: Recent Labs  Lab 01/27/18 1340  WBC 6.5  NEUTROABS 5.4  HGB 13.9  HCT 44.1  MCV 92.8  PLT 91*    Basic Metabolic Panel: Recent Labs  Lab 01/27/18 1340  NA 143  K 3.3*  CL 107  CO2 28  GLUCOSE 241*  BUN 9  CREATININE 0.95  CALCIUM 8.7*    GFR: Estimated Creatinine Clearance: 88.6 mL/min (by C-G formula based on SCr of 0.95 mg/dL).  Liver Function Tests: Recent Labs  Lab 01/27/18 1340  AST 15  ALT 13*  ALKPHOS 72  BILITOT 0.6  PROT 6.3*  ALBUMIN 3.6   No results for input(s): LIPASE, AMYLASE in the last 168 hours. No results for input(s): AMMONIA in the last 168 hours.  Coagulation Profile: Recent Labs  Lab 01/27/18 1340  INR 0.95   No results for input(s): DDIMER in the last 72 hours.  Cardiac Enzymes: Recent Labs  Lab 01/27/18 1340  TROPONINI <0.03    BNP (last 3 results) No results for input(s): PROBNP in the last 8760 hours.  HbA1C: No results for input(s): HGBA1C in the last 72 hours. Lab Results  Component Value Date   HGBA1C 6.2 (H) 10/25/2015     CBG: No results  for input(s): GLUCAP in the last 168 hours.  Lipid Profile: No results for input(s): CHOL, HDL, LDLCALC, TRIG, CHOLHDL, LDLDIRECT in the last 72 hours.  Thyroid Function Tests: No results for input(s): TSH, T4TOTAL, FREET4, T3FREE, THYROIDAB in the last 72 hours.  Anemia Panel: No results for input(s): VITAMINB12, FOLATE, FERRITIN, TIBC, IRON, RETICCTPCT in the last 72 hours.  Urine analysis:    Component Value Date/Time   COLORURINE YELLOW 10/24/2015 1705   APPEARANCEUR CLOUDY (A) 10/24/2015 1705   LABSPEC 1.024 10/24/2015 1705   PHURINE 5.0 10/24/2015 1705   GLUCOSEU NEGATIVE 10/24/2015 1705   HGBUR NEGATIVE 10/24/2015 1705   BILIRUBINUR NEGATIVE 10/24/2015 [place] 10/24/2015 [place]  30 (A) 10/24/2015 1705   NITRITE NEGATIVE 10/24/2015 1705   LEUKOCYTESUR NEGATIVE 10/24/2015 1705    Sepsis Labs: @LABRCNTIP (procalcitonin:4,lacticidven:4) )No results found for this or any previous visit (from the past 240 hour(s)).       Radiological Exams on Admission: Dg Chest 2 View  Result Date: 01/27/2018 CLINICAL DATA:  Worsening shortness of breath for 4 days. Mantle cell lymphoma. EXAM: CHEST - 2 VIEW COMPARISON:  11/28/2017 FINDINGS: Large left pleural effusion has increased in size since previous study, with compressive atelectasis of the left lung. Right lung remains clear. Heart size appears stable. Right-sided power port remains in appropriate position. IMPRESSION: Increased size of large left pleural effusion and compressive atelectasis. Electronically Signed   By: Earle Gell M.D.   On: 01/27/2018 12:15   Dg Chest 2 View  Result Date: 01/27/2018 CLINICAL DATA:  Worsening shortness of breath for 4 days. Mantle cell lymphoma. EXAM: CHEST - 2 VIEW COMPARISON:  11/28/2017 FINDINGS: Large left pleural effusion has increased in size since previous study, with compressive atelectasis of the left lung. Right lung remains clear. Heart size appears stable.  Right-sided power port remains in appropriate position. IMPRESSION: Increased size of large left pleural effusion and compressive atelectasis. Electronically Signed   By: Earle Gell M.D.   On: 01/27/2018 12:15      EKG: Independently reviewed. * normal sinus rhythm  Assessment/Plan Active Problems:   Shortness of breath secondary to recurrent Malignant pleural effusion Kristopher Johnson would need urgent therapeutic ultrasound guided thoracentesis , have also ordered pleural fluid analysis labs, cytology, flow cytometry It is likely a malignant effusion secondary to mantle cell lymphoma Kristopher Johnson continues to smoke Offered repeat CT scan of the chest after thoracentesis to evaluate lung parenchyma Kristopher Johnson will be admitted to telemetry Given history of prior chemotherapy will repeat 2-D echo to rule out new onset congestive heart failure   Mantle cell lymphoma of lymph nodes of multiple regions [facility]) Kristopher Johnson is followed by  Dr. Irene Limbo and he has been notified  Severe protein calorie malnutrition in the setting of malignancy Will consult nutritionist to make further recommendations  Hypokalemia Replete , check magnesium    DVT prophylaxis: Lovenox      consults called:  Family Communication: Admission, patients condition and plan of care including tests being ordered have been discussed with the Kristopher Johnson  who indicates understanding and agree with the plan and Code Status  Admission status: inpatient    Disposition plan: Further plan will depend as Kristopher Johnson's clinical course evolves and further radiologic and laboratory data become available. Likely home when stable   At the time of admission, it appears that the appropriate admission status for this Kristopher Johnson is INPATIENT .Thisis judged to be reasonable and necessary in order to provide the required intensity of service to ensure the Kristopher Johnson's safetygiven thepresenting symptoms, physical exam findings, and initial radiographic and  laboratory data in the context of their chronic comorbidities.   [name] MD [facility] Pager [phone]  If 7PM-7AM, please contact night-coverage [email or web address] Password [facility]  01/27/2018, 3:53 PM

## 2018-01-27 NOTE — Progress Notes (Signed)
Received patient from ED, alert and oriented, VS and weight obtained, telemetry applied, oriented to unit, call light placed in reach

## 2018-01-27 NOTE — ED Provider Notes (Signed)
Emergency Department Provider Note   I have reviewed the triage vital signs and the nursing notes.   HISTORY  Chief Complaint Shortness of Breath   HPI Kristopher Johnson is a 62 y.o. male with PMH of lymphoma on oral chemotherapy and with a recurrent malignant pleural effusion resents to the emergency department for evaluation of progressively worsening shortness of breath over the past 4 days.  He states it feels similar to his pleural effusion in the past which is had to be drained.  The most recent thoracentesis was January 2019.  He continues to remain compliant with his oral chemotherapy medication.  Patient is not anticoagulated.  Denies any fevers or chills.  He does have some chest pain with lying flat and experiences worsening dyspnea with lying flat.   Past Medical History:  Diagnosis Date  . Abscess of arm, right 10/27/2015  . Lymphoma of lymph nodes of multiple sites ([place]) 10/27/2015  . 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 Shortness of breath 10/27/2015    Patient Active Problem List   Diagnosis Date Noted  . Protein-calorie malnutrition, severe 10/29/2017  . 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 Large pleural effusion 10/25/2017  . Counseling regarding advanced care planning and goals of care 03/13/2017  . Rectal mass 03/02/2017  . Colon cancer metastasized to liver and lung([facility]) 03/02/2017  . Rectal bleeding 02/28/2017  . Portacath in place 01/30/2016  . Skin lesion of chest wall 12/19/2015  . Shingles 12/19/2015  . Rash 11/28/2015  . 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 Mantle cell lymphoma of lymph nodes of multiple regions ([place]) 11/01/2015  . Protein-calorie malnutrition ([place]) 11/01/2015  . Abscess of arm, right 10/27/2015  . Lymphoma of lymph nodes of multiple sites ([place]) 10/27/2015  . Shortness of breath 10/27/2015  . Shortness of breath dyspnea 10/27/2015  .  Hypoalbuminemia 10/25/2015  . Elevated lipase 10/25/2015  . Inguinal hernia 10/25/2015  . Urinary retention 10/25/2015  . Abdominal pain   . Lymphadenopathy 10/24/2015    Past Surgical History:  Procedure Laterality Date  . Cataract surgery     Patient notes she has had bilateral Surgery in 1998 and 99  . FLEXIBLE SIGMOIDOSCOPY N/A 03/01/2017   Procedure: FLEXIBLE SIGMOIDOSCOPY;  Surgeon: Manus Gunning, MD;  Location: [facility];  Service: Gastroenterology;  Laterality: N/A;  . TONSILLECTOMY     about 62years old    Current Outpatient Rx  . Order #: [id number] Class: Normal  . Order #: [id number] Class: Normal    Allergies Patient has no known allergies.  Family History  Problem Relation Age of Onset  . Colon cancer Neg Hx     Social History Social History   Tobacco Use  . Smoking status: Current Every Day Smoker    Packs/day: 1.00    Years: 33.00    Pack years: 33.00    Types: Cigarettes  . Smokeless tobacco: Never Used  Substance Use Topics  . Alcohol use: No  . Drug use: No    Review of Systems  Constitutional: No fever/chills Eyes: No visual changes. ENT: No sore throat. Cardiovascular: Positive chest pain. Respiratory: Positive SOB.  Gastrointestinal: No abdominal pain.  No nausea, no vomiting.  No diarrhea.  No constipation. Genitourinary: Negative for dysuria. Musculoskeletal: Negative for back pain. Skin: Negative for rash. Neurological: Negative for headaches, focal weakness or numbness.  10-point ROS otherwise negative.  ____________________________________________  PHYSICAL EXAM:  VITAL SIGNS: ED Triage Vitals [01/27/18 1124]  Enc Vitals Group     BP 137/83     Pulse Rate 89     Resp 19     Temp 97.8 F (36.6 C)     Temp Source Oral     SpO2 96 %     Weight 209 lb (94.8 kg)     Height 5' 8.5" (1.74 m)     Pain Score 0   Constitutional: Alert and oriented. Well appearing and in no acute distress. Eyes: Conjunctivae are  normal.  Head: Atraumatic. Nose: No congestion/rhinnorhea. Mouth/Throat: Mucous membranes are moist.  Neck: No stridor.   Cardiovascular: Normal rate, regular rhythm. Good peripheral circulation. Grossly normal heart sounds.   Respiratory: Slight respiratory effort.  No retractions. Lungs diminished on the left.  Gastrointestinal: Soft and nontender. No distention.  Musculoskeletal: No lower extremity tenderness nor edema. No gross deformities of extremities. Neurologic:  Normal speech and language. No gross focal neurologic deficits are appreciated.  Skin:  Skin is warm, dry and intact. No rash noted.  ____________________________________________   LABS (all labs ordered are listed, but only abnormal results are displayed)  Labs Reviewed  CBC WITH DIFFERENTIAL/PLATELET - Abnormal; Notable for the following components:      Result Value   Platelets 91 (*)    Lymphs Abs 0.6 (*)    All other components within normal limits  COMPREHENSIVE METABOLIC PANEL - Abnormal; Notable for the following components:   Potassium 3.3 (*)    Glucose, Bld 241 (*)    Calcium 8.7 (*)    Total Protein 6.3 (*)    ALT 13 (*)    All other components within normal limits  PROTIME-INR  TROPONIN I  CBC WITH DIFFERENTIAL/PLATELET  BRAIN NATRIURETIC PEPTIDE   ____________________________________________  EKG   EKG Interpretation  Date/Time:  Monday January 27 2018 11:26:46 EDT Ventricular Rate:  93 PR Interval:    QRS Duration: 85 QT Interval:  348 QTC Calculation: 433 R Axis:   40 Text Interpretation:  Sinus rhythm No STEMI.  Confirmed by Nanda Quinton [phone]) on 01/27/2018 12:35:34 PM       ____________________________________________  RADIOLOGY  Dg Chest 2 View  Result Date: 01/27/2018 CLINICAL DATA:  Worsening shortness of breath for 4 days. Mantle cell lymphoma. EXAM: CHEST - 2 VIEW COMPARISON:  11/28/2017 FINDINGS: Large left pleural effusion has increased in size since previous study,  with compressive atelectasis of the left lung. Right lung remains clear. Heart size appears stable. Right-sided power port remains in appropriate position. IMPRESSION: Increased size of large left pleural effusion and compressive atelectasis. Electronically Signed   By: Earle Gell M.D.   On: 01/27/2018 12:15    ____________________________________________   PROCEDURES  Procedure(s) performed:   Procedures  None  ____________________________________________   INITIAL IMPRESSION / ASSESSMENT AND PLAN / ED COURSE  Pertinent labs & imaging results that were available during my care of the patient were reviewed by me and considered in my medical decision making (see chart for details).  Patient presents to the emergency department for evaluation of increasing shortness of breath over the past 4 days.  Chest x-ray from triage shows reaccumulation of a large left pleural effusion which she has had drained previously on 10/25/17 with Radiology.  We will obtain baseline labs.  EKG reviewed with no acute findings.  Patient does not have an oxygen requirement but is needing to sit upright in a position of comfort.  Plan for admission and repeat thoracentesis.  Reviewed labs with no acute findings. BNP and INR pending. Will admit for thoracentesis.   Discussed patient's case with [facility] to request admission. Patient and family (if present) updated with plan. Care transferred to [facility] service.  I reviewed all nursing notes, vitals, pertinent old records, EKGs, labs, imaging (as available).  ____________________________________________  FINAL CLINICAL IMPRESSION(S) / ED DIAGNOSES  Final diagnoses:  Recurrent left pleural effusion  SOB (shortness of breath)    MEDICATIONS GIVEN DURING THIS VISIT:  Medications  albuterol (PROVENTIL) (2.5 MG/3ML) 0.083% nebulizer solution 5 mg (5 mg Nebulization Given 01/27/18 1250)    Note:  This document was prepared using Dragon voice  recognition software and may include unintentional dictation errors.  Nanda Quinton, MD Emergency Medicine    Jeniece Hannis, [facility], MD 01/27/18 [phone]

## 2018-01-27 NOTE — ED Triage Notes (Signed)
Pt reports having SOB since last Thursday/Friday. Reports had to get fluid drained off lungs before.  Pt is cancer patient takes oral chemo

## 2018-01-27 NOTE — Procedures (Signed)
Ultrasound-guided diagnostic and therapeutic left thoracentesis performed yielding 2.8 liters of blood-tinged  fluid. No immediate complications. Follow-up chest x-ray pending. A portion of the fluid was sent to the lab for preordered studies.

## 2018-01-27 NOTE — Plan of Care (Signed)
  Problem: Clinical Measurements: Goal: Diagnostic test results will improve Outcome: Progressing Goal: Respiratory complications will improve Outcome: Progressing   

## 2018-01-27 NOTE — ED Notes (Signed)
ED TO INPATIENT HANDOFF REPORT  Name/Age/Gender Kristopher Johnson 62 y.o. male  Code Status Code Status History    Date Active Date Inactive Code Status Order ID Comments User Context   10/25/2017 1628 11/01/2017 1610 Full Code [id number]  [name], MD Inpatient   03/01/2017 0123 03/02/2017 1336 Full Code [id number]  [name], MD ED   10/24/2015 2317 10/25/2015 2051 Full Code [id number]  [name], MD Inpatient      Home/SNF/Other Home  Chief Complaint ca pt/shob  Level of Care/Admitting Diagnosis ED Disposition    ED Disposition Condition [facility] Area: [facility] [[id number]]  Level of Care: Telemetry [5]  Admit to tele based on following criteria: Complex arrhythmia (Bradycardia/Tachycardia)  Diagnosis: Malignant pleural effusion [511.81.ICD-9-CM]  Admitting Physician: [name] [[id number]]  Attending Physician: [name] [[id number]]  Estimated length of stay: past midnight tomorrow  Certification:: I certify this patient will need inpatient services for at least 2 midnights  PT Class (Do Not Modify): Inpatient [101]  PT Acc Code (Do Not Modify): Private [1]       Medical History Past Medical History:  Diagnosis Date  . Abscess of arm, right 10/27/2015  . 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 Shortness of breath 10/27/2015    Allergies No Known Allergies  IV Location/Drains/Wounds Patient Lines/Drains/Airways Status   Active Line/Drains/Airways    Name:   Placement date:   Placement time:   Site:   Days:   Implanted Port 11/10/15 Right Chest   11/10/15    -    Chest   809          Labs/Imaging Results for orders placed or performed during the hospital encounter of 01/27/18 (from the past 48 hour(s))  Brain natriuretic peptide     Status: None   Collection  Time: 01/27/18  1:40 PM  Result Value Ref Range   B Natriuretic Peptide 5.4 0.0 - 100.0 pg/mL    Comment: Performed at [facility], [place] [address] [place], [place] [address]  CBC with Differential/Platelet     Status: Abnormal   Collection Time: 01/27/18  1:40 PM  Result Value Ref Range   WBC 6.5 4.0 - 10.5 K/uL   RBC 4.75 4.22 - 5.81 MIL/uL   Hemoglobin 13.9 13.0 - 17.0 g/dL   HCT 44.1 39.0 - 52.0 %   MCV 92.8 78.0 - 100.0 fL   MCH 29.3 26.0 - 34.0 pg   MCHC 31.5 30.0 - 36.0 g/dL   RDW 15.3 11.5 - 15.5 %   Platelets 91 (L) 150 - 400 K/uL    Comment: REPEATED TO VERIFY SPECIMEN CHECKED FOR CLOTS PLATELET COUNT CONFIRMED BY SMEAR    Neutrophils Relative % 83 %   Neutro Abs 5.4 1.7 - 7.7 K/uL   Lymphocytes Relative 9 %   Lymphs Abs 0.6 (L) 0.7 - 4.0 K/uL   Monocytes Relative 4 %   Monocytes Absolute 0.3 0.1 - 1.0 K/uL   Eosinophils Relative 2 %   Eosinophils Absolute 0.1 0.0 - 0.7 K/uL   Basophils Relative 2 %   Basophils Absolute 0.1 0.0 - 0.1 K/uL    Comment: Performed at [facility], [place] [address], [place], [place] [address]  Comprehensive metabolic panel  Status: Abnormal   Collection Time: 01/27/18  1:40 PM  Result Value Ref Range   Sodium 143 135 - 145 mmol/L   Potassium 3.3 (L) 3.5 - 5.1 mmol/L   Chloride 107 101 - 111 mmol/L   CO2 28 22 - 32 mmol/L   Glucose, Bld 241 (H) 65 - 99 mg/dL   BUN 9 6 - 20 mg/dL   Creatinine, Ser 0.95 0.61 - 1.24 mg/dL   Calcium 8.7 (L) 8.9 - 10.3 mg/dL   Total Protein 6.3 (L) 6.5 - 8.1 g/dL   Albumin 3.6 3.5 - 5.0 g/dL   AST 15 15 - 41 U/L   ALT 13 (L) 17 - 63 U/L   Alkaline Phosphatase 72 38 - 126 U/L   Total Bilirubin 0.6 0.3 - 1.2 mg/dL   GFR calc non Af Amer >60 >60 mL/min   GFR calc Af Amer >60 >60 mL/min    Comment: (NOTE) The eGFR has been calculated using the CKD EPI equation. This calculation has not been validated in all clinical situations. eGFR's persistently <60 mL/min  signify possible Chronic Kidney Disease.    Anion gap 8 5 - 15    Comment: Performed at [facility], [place] [address] [place], [place] [address]  Protime-INR     Status: None   Collection Time: 01/27/18  1:40 PM  Result Value Ref Range   Prothrombin Time 12.6 11.4 - 15.2 seconds   INR 0.95     Comment: Performed at [facility], [place] [address], [place], [place] [address]  Troponin I     Status: None   Collection Time: 01/27/18  1:40 PM  Result Value Ref Range   Troponin I <0.03 <0.03 ng/mL    Comment: Performed at [facility], [place] [address] [place], [place] [address]   Dg Chest 1 View  Result Date: 01/27/2018 CLINICAL DATA:  62 year old male status post left thoracentesis EXAM: CHEST  1 VIEW COMPARISON:  01/27/2018 FINDINGS: Cardiomediastinal silhouette likely unchanged with the left heart border partially obscured by residual opacity at the left lung base. Compared to the prior there is improved aeration with decreased opacity on the left. No pneumothorax. Right lung relatively well aerated. Port catheter on the right chest wall, unchanged. No displaced fracture. IMPRESSION: No complicating features status post left-sided thoracentesis. Residual opacity at the left base, likely a combination of residual fluid, atelectasis/consolidation, and soft tissue in this patient with known lymphoma. Electronically Signed   By: Corrie Mckusick D.O.   On: 01/27/2018 17:42   Dg Chest 2 View  Result Date: 01/27/2018 CLINICAL DATA:  Worsening shortness of breath for 4 days. Mantle cell lymphoma. EXAM: CHEST - 2 VIEW COMPARISON:  11/28/2017 FINDINGS: Large left pleural effusion has increased in size since previous study, with compressive atelectasis of the left lung. Right lung remains clear. Heart size appears stable. Right-sided power port remains in appropriate position. IMPRESSION: Increased size of large left pleural effusion and compressive  atelectasis. Electronically Signed   By: Earle Gell M.D.   On: 01/27/2018 12:15   US Thoracentesis Asp Pleural Space W/img Guide  Result Date: 01/27/2018 INDICATION: Patient with history of mantle cell lymphoma with recurrent malignant left pleural effusion, dyspnea. Request made for diagnostic and therapeutic left thoracentesis. EXAM: ULTRASOUND GUIDED DIAGNOSTIC AND THERAPEUTIC LEFT THORACENTESIS MEDICATIONS: None COMPLICATIONS: None immediate. PROCEDURE: An ultrasound guided thoracentesis was thoroughly discussed with the patient and questions answered. The benefits, risks, alternatives and complications were also  discussed. The patient understands and wishes to proceed with the procedure. Written consent was obtained. Ultrasound was performed to localize and mark an adequate pocket of fluid in the left chest. The area was then prepped and draped in the normal sterile fashion. 1% Lidocaine was used for local anesthesia. Under ultrasound guidance a 6 Fr Safe-T-Centesis catheter was introduced. Thoracentesis was performed. The catheter was removed and a dressing applied. FINDINGS: A total of approximately 2.8 liters of blood-tinged fluid was removed. Samples were sent to the laboratory as requested by the clinical team. IMPRESSION: Successful ultrasound guided diagnostic and therapeutic left thoracentesis yielding 2.8 liters of pleural fluid. Read by: Rowe Gaddiel, PA-C Electronically Signed   By: Corrie Mckusick D.O.   On: 01/27/2018 17:16    Pending Labs Unresulted Labs (From admission, onward)   Start     Ordered   01/27/18 1732  Lactate dehydrogenase (pleural or peritoneal fluid)  R     01/27/18 1732   01/27/18 1732  Body fluid culture  R     01/27/18 1732   01/27/18 1732  Protein, pleural or peritoneal fluid  R     01/27/18 1732   01/27/18 1732  PH, Body Fluid  R     01/27/18 1732   01/27/18 1227  CBC with Differential  Once,   STAT     01/27/18 1227   Signed and Held  CBC  (enoxaparin  (LOVENOX)    CrCl >/= 30 ml/min)  Once,   R    Comments:  Baseline for enoxaparin therapy IF NOT ALREADY DRAWN.  Notify MD if PLT < 100 K.    Signed and Held   Signed and Held  Creatinine, serum  (enoxaparin (LOVENOX)    CrCl >/= 30 ml/min)  Once,   R    Comments:  Baseline for enoxaparin therapy IF NOT ALREADY DRAWN.    Signed and Held   Signed and Held  Creatinine, serum  (enoxaparin (LOVENOX)    CrCl >/= 30 ml/min)  Weekly,   R    Comments:  while on enoxaparin therapy    Signed and Held   Signed and Held  Magnesium  Add-on,   R     Signed and Held   Signed and Held  Comprehensive metabolic panel  Tomorrow morning,   R     Signed and Held   Signed and Held  CBC  Tomorrow morning,   R     Signed and Held   Signed and Held  Protime-INR  Tomorrow morning,   R     Signed and Held      Vitals/Pain Today's Vitals   01/27/18 1637 01/27/18 1700 01/27/18 1707 01/27/18 1713  BP: 130/74 128/71 (!) 141/72 (!) 148/68  Pulse:      Resp:      Temp:      TempSrc:      SpO2:      Weight:      Height:      PainSc:        Isolation Precautions No active isolations  Medications Medications  lidocaine (XYLOCAINE) 1 % (with pres) injection (has no administration in time range)  albuterol (PROVENTIL) (2.5 MG/3ML) 0.083% nebulizer solution 5 mg (5 mg Nebulization Given 01/27/18 1250)  potassium chloride SA (K-DUR,KLOR-CON) CR tablet 40 mEq (40 mEq Oral Given 01/27/18 1608)    Mobility walks

## 2018-01-28 ENCOUNTER — Inpatient Hospital Stay (HOSPITAL_COMMUNITY): Payer: Medicare Other

## 2018-01-28 ENCOUNTER — Encounter (HOSPITAL_COMMUNITY): Payer: Self-pay | Admitting: Radiology

## 2018-01-28 DIAGNOSIS — E876 Hypokalemia: Secondary | ICD-10-CM

## 2018-01-28 DIAGNOSIS — I517 Cardiomegaly: Secondary | ICD-10-CM

## 2018-01-28 DIAGNOSIS — C8319 Mantle cell lymphoma, extranodal and solid organ sites: Secondary | ICD-10-CM

## 2018-01-28 LAB — COMPREHENSIVE METABOLIC PANEL
ALK PHOS: 59 U/L (ref 38–126)
ALT: 12 U/L — ABNORMAL LOW (ref 17–63)
ANION GAP: 9 (ref 5–15)
AST: 12 U/L — ABNORMAL LOW (ref 15–41)
Albumin: 3 g/dL — ABNORMAL LOW (ref 3.5–5.0)
BUN: 10 mg/dL (ref 6–20)
CALCIUM: 8.6 mg/dL — AB (ref 8.9–10.3)
CHLORIDE: 106 mmol/L (ref 101–111)
CO2: 27 mmol/L (ref 22–32)
CREATININE: 0.96 mg/dL (ref 0.61–1.24)
GFR calc Af Amer: >60 mL/min (ref >60)
Glucose, Bld: 134 mg/dL — ABNORMAL HIGH (ref 65–99)
Potassium: 3.7 mmol/L (ref 3.5–5.1)
Sodium: 142 mmol/L (ref 135–145)
Total Bilirubin: 0.7 mg/dL (ref 0.3–1.2)
Total Protein: 5.4 g/dL — ABNORMAL LOW (ref 6.5–8.1)

## 2018-01-28 LAB — PH, BODY FLUID: pH, Body Fluid: 7.4

## 2018-01-28 LAB — ECHOCARDIOGRAM COMPLETE
HEIGHTINCHES: 68 in
WEIGHTICAEL: 3038.4 [oz_av]

## 2018-01-28 LAB — CBC
HCT: 43.2 % (ref 39.0–52.0)
HEMOGLOBIN: 13.6 g/dL (ref 13.0–17.0)
MCH: 29.3 pg (ref 26.0–34.0)
MCHC: 31.5 g/dL (ref 30.0–36.0)
MCV: 93.1 fL (ref 78.0–100.0)
PLATELETS: 82 10*3/uL — AB (ref 150–400)
RBC: 4.64 MIL/uL (ref 4.22–5.81)
RDW: 15.4 % (ref 11.5–15.5)
WBC: 7.6 10*3/uL (ref 4.0–10.5)

## 2018-01-28 LAB — GRAM STAIN

## 2018-01-28 LAB — PROTIME-INR
INR: 1.04
PROTHROMBIN TIME: 13.5 s (ref 11.4–15.2)

## 2018-01-28 MED ORDER — IOPAMIDOL (ISOVUE-370) INJECTION 76%
100.0000 mL | Freq: Once | INTRAVENOUS | Status: AC | PRN
  Administered 2018-01-28: 100 mL via INTRAVENOUS

## 2018-01-28 MED ORDER — IOPAMIDOL (ISOVUE-370) INJECTION 76%
INTRAVENOUS | Status: AC
  Filled 2018-01-28: qty 100

## 2018-01-28 NOTE — Progress Notes (Addendum)
PROGRESS NOTE [facility]   [name]   [name] DOB: 07-Sep-1956  DOA: 01/27/2018 PCP: Patient, No Pcp Per   Brief Narrative:  Kristopher Johnson 62 year old male with medical history of mantle cell lymphoma currently on Ibrunitib.  Presented to the emergency department complaining of shortness of breath for 4 days prior to admission.  Upon ED evaluation patient was in tripod position unable to lie flat checks x-ray showing increased size of large left pleural effusion with compressive atelectasis.  Patient was hemodynamically stable and was admitted with working diagnosis of recurrent malignant pleural effusion.  Oncology has been consulted.  Subjective: Patient seen and examined, he is feeling significantly better and breathing back to normal.  Denies chest pain, shortness of breath and palpitation.  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  No organisms seen on Gram stain. Repeat chest x-ray, I have discussed with patient if this is a recurring problem may need a Pleurx.  Oncology  evaluation pending to discuss progression of disease. Echocardiogram pending, to rule out chemo induced heart failure. If patient remains stable on chest x-ray negative can be d/c in a.m.  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 Irene Limbo as outpatient, holding chemotherapy for now until echocardiogram is back  Severe protein caloric malnutrition in the setting of malignancy  Follow dietitian recommendations hypokalemia  Hypokalemia Repleted Check BMP and mag in a.m.  DVT prophylaxis: SCDs Code Status: Full code Family Communication: None at bedside Disposition Plan: Home in a.m. if stable and cleared by oncology oncology  Consultants:   Oncology  Procedures:   Thoracentesis 4/22  Antimicrobials:  None   Objective: Vitals:   01/27/18 1839  01/27/18 2057 01/27/18 2145 01/28/18 0430  BP:  (!) 127/55  128/74  Pulse:  87  83  Resp:  (!) 25  17  Temp:  98.5 F (36.9 C)  98.6 F (37 C)  TempSrc:  Oral  Oral  SpO2:  (!) 88% 93% 90%  Weight: 86.1 kg (189 lb 14.4 oz)     Height: 5\' 8"  (1.727 m)       Intake/Output Summary (Last 24 hours) at 01/28/2018 1425 Last data filed at 01/28/2018 0600 Gross per 24 hour  Intake 360 ml  Output -  Net 360 ml   Filed Weights   01/27/18 1124 01/27/18 1839  Weight: 94.8 kg (209 lb) 86.1 kg (189 lb 14.4 oz)    Examination:  General exam: Appears calm and comfortable  HEENT: OP moist and clear Respiratory system: Decreased breath sounds on right side.  Bibasilar rales on the right.  Good air entry in the left. Cardiovascular system: S1 & S2 heard, RRR. No JVD, murmurs, rubs or gallops Gastrointestinal system: Abdomen is nondistended, soft and nontender. No organomegaly or masses felt. Normal bowel sounds heard. Central nervous system: Alert and oriented. No focal neurological deficits. Extremities: No pedal edema. Symmetric, strength 5/5   Skin: No rashes, lesions or ulcers Psychiatry: Judgement and insight appear normal. Mood & affect appropriate.    Data Reviewed: I have personally reviewed following labs and imaging studies  CBC: Recent Labs  Lab 01/27/18 1340 01/28/18 0433  WBC 6.5 7.6  NEUTROABS 5.4  --   HGB 13.9 13.6  HCT 44.1 43.2  MCV 92.8 93.1  PLT 91* 82*   Basic Metabolic Panel: Recent Labs  Lab 01/27/18 1340 01/28/18 0433  NA 143 142  K 3.3* 3.7  CL 107 106  CO2 28 27  GLUCOSE 241* 134*  BUN 9 10  CREATININE 0.95 0.96  CALCIUM 8.7* 8.6*  MG 1.8  --    GFR: Estimated Creatinine Clearance: 83 mL/min (by C-G formula based on SCr of 0.96 mg/dL). Liver Function Tests: Recent Labs  Lab 01/27/18 1340 01/28/18 0433  AST 15 12*  ALT 13* 12*  ALKPHOS 72 59  BILITOT 0.6 0.7  PROT 6.3* 5.4*  ALBUMIN 3.6 3.0*   No results for input(s): LIPASE, AMYLASE  in the last 168 hours. No results for input(s): AMMONIA in the last 168 hours. Coagulation Profile: Recent Labs  Lab 01/27/18 1340 01/28/18 0433  INR 0.95 1.04   Cardiac Enzymes: Recent Labs  Lab 01/27/18 1340  TROPONINI <0.03   BNP (last 3 results) No results for input(s): PROBNP in the last 8760 hours. HbA1C: No results for input(s): HGBA1C in the last 72 hours. CBG: No results for input(s): GLUCAP in the last 168 hours. Lipid Profile: No results for input(s): CHOL, HDL, LDLCALC, TRIG, CHOLHDL, LDLDIRECT in the last 72 hours. Thyroid Function Tests: No results for input(s): TSH, T4TOTAL, FREET4, T3FREE, THYROIDAB in the last 72 hours. Anemia Panel: No results for input(s): VITAMINB12, FOLATE, FERRITIN, TIBC, IRON, RETICCTPCT in the last 72 hours. Sepsis Labs: No results for input(s): PROCALCITON, LATICACIDVEN in the last 168 hours.  Recent Results (from the past 240 hour(s))  Culture, body fluid-bottle     Status: None (Preliminary result)   Collection Time: 01/27/18 11:16 AM  Result Value Ref Range Status   Specimen Description PLEURAL LEFT  Final   Special Requests NONE  Final   Culture   Final    NO GROWTH < 24 HOURS Performed at [facility], [address] [place], [place] [address]    Report Status PENDING  Incomplete  Gram stain     Status: None   Collection Time: 01/27/18 11:16 AM  Result Value Ref Range Status   Specimen Description PLEURAL LEFT  Final   Special Requests NONE  Final   Gram Stain   Final    MODERATE WBC PRESENT, PREDOMINANTLY MONONUCLEAR NO ORGANISMS SEEN Performed at [facility], [address], [place], [place] [address]    Report Status 01/28/2018 FINAL  Final      Radiology Studies: Dg Chest 1 View  Result Date: 01/27/2018 CLINICAL DATA:  62 year old male status post left thoracentesis EXAM: CHEST  1 VIEW COMPARISON:  01/27/2018 FINDINGS: Cardiomediastinal silhouette likely unchanged with the left heart border  partially obscured by residual opacity at the left lung base. Compared to the prior there is improved aeration with decreased opacity on the left. No pneumothorax. Right lung relatively well aerated. Port catheter on the right chest wall, unchanged. No displaced fracture. IMPRESSION: No complicating features status post left-sided thoracentesis. Residual opacity at the left base, likely a combination of residual fluid, atelectasis/consolidation, and soft tissue in this patient with known lymphoma. Electronically Signed   By: Corrie Mckusick D.O.   On: 01/27/2018 17:42   Dg Chest 2 View  Result Date: 01/27/2018 CLINICAL DATA:  Worsening shortness of breath for 4 days. Mantle cell lymphoma. EXAM: CHEST - 2 VIEW COMPARISON:  11/28/2017 FINDINGS: Large left pleural effusion has increased in size since previous study, with compressive atelectasis of the left lung. Right lung remains clear. Heart size appears stable. Right-sided power port remains in appropriate position. IMPRESSION: Increased size of  large left pleural effusion and compressive atelectasis. Electronically Signed   By: Earle Gell M.D.   On: 01/27/2018 12:15   Ct Angio Chest Pe W Or Wo Contrast  Result Date: 01/28/2018 CLINICAL DATA:  Five-day history of shortness of breath. History of non-Hodgkin's lymphoma. EXAM: CT ANGIOGRAPHY CHEST WITH CONTRAST TECHNIQUE: Multidetector CT imaging of the chest was performed using the standard protocol during bolus administration of intravenous contrast. Multiplanar CT image reconstructions and MIPs were obtained to evaluate the vascular anatomy. CONTRAST:  111mL ISOVUE-370 IOPAMIDOL (ISOVUE-370) INJECTION 76% COMPARISON:  PET-CT 10/25/2017 FINDINGS: Cardiovascular: The heart is normal in size. No pericardial effusion. The aorta is normal in caliber. No dissection. The branch vessels are patent. The pulmonary arterial tree is fairly well opacified. Breathing motion artifact limits examination but no definite  filling defects to suggest pulmonary embolism. Mediastinum/Nodes: Much improved right axillary and subpectoral adenopathy. The mediastinal and hilar adenopathy is also much improved. 10 mm precarinal lymph node on image number 42 previously measured 16 mm. The 22 mm subcarinal nodal mass seen on the prior study has resolved. Lungs/Pleura: Large pleural lesions have significantly improved since the prior examination. The pleural lesion or internal mammary adenopathy on the left side measures 25 mm and previously measured 37 mm. The large pleural mass on the left side slightly more inferiorly has completely resolved. The right internal mammary adenopathy has largely resolved. The pleural mass at the left lung base laterally on image number 74 appears relatively stable. Persistent moderate-sized left pleural effusion with associated enhancing pleural nodularity posteriorly appears somewhat improved. Patchy airspace disease in the left upper lobe and lingula, likely pneumonia. This may account for the patient's shortness of breath. The right lung remains clear.  No right-sided pleural effusion. Upper Abdomen: Stable left hepatic lobe cyst. Stable upper abdominal lymphadenopathy. Musculoskeletal: No significant bony findings. Stable small scattered sclerotic bone lesions, likely benign bone islands. Review of the MIP images confirms the above findings. IMPRESSION: 1. No definite CT findings for pulmonary embolism. Exam is somewhat limited by breathing motion artifact. 2. Left upper lobe and lingula pneumonia. 3. Overall improved lymphadenopathy and left-sided pleural disease. Persistent left effusion. Electronically Signed   By: Marijo Sanes M.D.   On: 01/28/2018 10:33   Dg Chest Port 1 View  Result Date: 01/28/2018 CLINICAL DATA:  Status post left-sided thoracentesis yesterday. History of lymphoma. EXAM: PORTABLE CHEST 1 VIEW COMPARISON:  Postprocedure chest x-ray of January 27, 2018 FINDINGS: The right lung is  well-expanded and clear. On the left there is increased density in the mid to lower lung. The left pleural effusion is smaller but atelectasis or infiltrate is more apparent in the lower 1/2 of the left hemithorax today. The heart is not enlarged. The pulmonary vascularity remains prominent centrally. The power port catheter tip projects over the midportion of the SVC. IMPRESSION: Interval increase conspicuity of atelectasis or pneumonia at the left lung base. Persistent small left pleural effusion. Electronically Signed   By: David  Martinique M.D.   On: 01/28/2018 12:02   US Thoracentesis Asp Pleural Space W/img Guide  Result Date: 01/27/2018 INDICATION: Patient with history of mantle cell lymphoma with recurrent malignant left pleural effusion, dyspnea. Request made for diagnostic and therapeutic left thoracentesis. EXAM: ULTRASOUND GUIDED DIAGNOSTIC AND THERAPEUTIC LEFT THORACENTESIS MEDICATIONS: None COMPLICATIONS: None immediate. PROCEDURE: An ultrasound guided thoracentesis was thoroughly discussed with the patient and questions answered. The benefits, risks, alternatives and complications were also discussed. The patient understands and wishes to proceed with  the procedure. Written consent was obtained. Ultrasound was performed to localize and mark an adequate pocket of fluid in the left chest. The area was then prepped and draped in the normal sterile fashion. 1% Lidocaine was used for local anesthesia. Under ultrasound guidance a 6 Fr Safe-T-Centesis catheter was introduced. Thoracentesis was performed. The catheter was removed and a dressing applied. FINDINGS: A total of approximately 2.8 liters of blood-tinged fluid was removed. Samples were sent to the laboratory as requested by the clinical team. IMPRESSION: Successful ultrasound guided diagnostic and therapeutic left thoracentesis yielding 2.8 liters of pleural fluid. Read by: Rowe Willies, PA-C Electronically Signed   By: Corrie Mckusick D.O.   On:  01/27/2018 17:16      Scheduled Meds: . iopamidol      . sodium chloride flush  3 mL Intravenous Q12H   Continuous Infusions: . sodium chloride       LOS: 1 day   Time spent: Total of 25 minutes spent with pt, greater than 50% of which was spent in discussion of  treatment, counseling and coordination of care  Chipper Oman, MD Pager: Text Page via [email or web address]   If 7PM-7AM, please contact night-coverage [email or web address] 01/28/2018, 2:25 PM   Note - This record has been created using [employer]. Chart creation errors have been sought, but may not always have been located. Such creation errors do not reflect on the standard of medical care.

## 2018-01-28 NOTE — Progress Notes (Signed)
Initial Nutrition Assessment  DOCUMENTATION CODES:   Not applicable  INTERVENTION:   RD to encourage PO intake  NUTRITION DIAGNOSIS:   Increased nutrient needs related to cancer and cancer related treatments as evidenced by estimated needs.  GOAL:   Patient will meet greater than or equal to 90% of their needs  MONITOR:   PO intake, Weight trends, Labs  REASON FOR ASSESSMENT:   Consult Assessment of nutrition requirement/status  ASSESSMENT:   Patient with PMH significant for mantle cell lymphoma diagnosed in January 2017 currently on Ibrutinib 560 mg daily followed by Dr. Irene Limbo. Presents this admission with increased size of large left pleural effusion. Patient is being admitted for urgent thoracentesis.    Spoke with pt at bedside. Denies having any loss in appetite PTA, States he typically eats three meals per day that consist of toast/tea for breakfast, and sandwich for lunch, and meat/vegetable for dinner. RD observed pt's breakfast tray at bedside 100% completed. Pt had omelet with breakfast potatoes and denies any complication. Pt would not like to have any supplements this admission.    Pt endorses a UBW of 200 lb, the last time being at that weight at the beginning of the year. Records indicate pt weighed 204 lb 09/17/18 and 189 lb this admission (7.3% in five months, insignificant for time frame). Pt reports he lost down to 168 lb in February but is slowly gaining it back. Nutrition-Focused physical exam completed.   Medications reviewed. Labs reviewed.   NUTRITION - FOCUSED PHYSICAL EXAM:    Most Recent Value  Orbital Region  No depletion  Upper Arm Region  No depletion  Thoracic and Lumbar Region  Unable to assess  Buccal Region  No depletion  Temple Region  No depletion  Clavicle Bone Region  No depletion  Clavicle and Acromion Bone Region  No depletion  Scapular Bone Region  Unable to assess  Dorsal Hand  No depletion  Patellar Region  Unable to assess   Anterior Thigh Region  Unable to assess  Posterior Calf Region  Unable to assess  Edema (RD Assessment)  None     Diet Order:  Diet Heart Room service appropriate? Yes; Fluid consistency: Thin  EDUCATION NEEDS:   Education needs have been addressed  Skin:  Skin Assessment: Reviewed RN Assessment  Last BM:  PTA  Height:   Ht Readings from Last 1 Encounters:  01/27/18 5\' 8"  (1.727 m)    Weight:   Wt Readings from Last 1 Encounters:  01/27/18 189 lb 14.4 oz (86.1 kg)    Ideal Body Weight:  70 kg  BMI:  Body mass index is 28.87 kg/m.  Estimated Nutritional Needs:   Kcal:  2100-2300 kcal  Protein:  105-115 g  Fluid:  >2.1 L/day    Mariana Single RD, LDN Clinical Nutrition Pager # - [phone]

## 2018-01-28 NOTE — Progress Notes (Signed)
  Echocardiogram 2D Echocardiogram has been performed.  Kristopher Johnson Kristopher Johnson 01/28/2018, 1:12 PM

## 2018-01-28 NOTE — Progress Notes (Signed)
Pt refused SCD, understands purpose of SCD. SRP, RN

## 2018-01-29 ENCOUNTER — Inpatient Hospital Stay (HOSPITAL_COMMUNITY): Payer: Medicare Other

## 2018-01-29 DIAGNOSIS — Z9889 Other specified postprocedural states: Secondary | ICD-10-CM

## 2018-01-29 LAB — CBC WITH DIFFERENTIAL/PLATELET
Basophils Absolute: 0.1 10*3/uL (ref 0.0–0.1)
Basophils Relative: 2 %
Eosinophils Absolute: 0.2 10*3/uL (ref 0.0–0.7)
Eosinophils Relative: 2 %
HEMATOCRIT: 42.9 % (ref 39.0–52.0)
HEMOGLOBIN: 13.6 g/dL (ref 13.0–17.0)
LYMPHS ABS: 0.9 10*3/uL (ref 0.7–4.0)
LYMPHS PCT: 11 %
MCH: 29.4 pg (ref 26.0–34.0)
MCHC: 31.7 g/dL (ref 30.0–36.0)
MCV: 92.9 fL (ref 78.0–100.0)
MONOS PCT: 2 %
Monocytes Absolute: 0.1 10*3/uL (ref 0.1–1.0)
NEUTROS ABS: 6.7 10*3/uL (ref 1.7–7.7)
NEUTROS PCT: 83 %
Platelets: 79 10*3/uL — ABNORMAL LOW (ref 150–400)
RBC: 4.62 MIL/uL (ref 4.22–5.81)
RDW: 15.3 % (ref 11.5–15.5)
WBC: 8 10*3/uL (ref 4.0–10.5)

## 2018-01-29 LAB — BASIC METABOLIC PANEL
ANION GAP: 9 (ref 5–15)
BUN: 9 mg/dL (ref 6–20)
CO2: 26 mmol/L (ref 22–32)
CREATININE: 0.8 mg/dL (ref 0.61–1.24)
Calcium: 8.3 mg/dL — ABNORMAL LOW (ref 8.9–10.3)
Chloride: 105 mmol/L (ref 101–111)
GFR calc non Af Amer: >60 mL/min (ref >60)
Glucose, Bld: 144 mg/dL — ABNORMAL HIGH (ref 65–99)
Potassium: 3.4 mmol/L — ABNORMAL LOW (ref 3.5–5.1)
SODIUM: 140 mmol/L (ref 135–145)

## 2018-01-29 LAB — MAGNESIUM: MAGNESIUM: 1.6 mg/dL — AB (ref 1.7–2.4)

## 2018-01-29 MED ORDER — TRAMADOL HCL 50 MG PO TABS: 100.0000 mg | ORAL_TABLET | Freq: Four times a day (QID) | ORAL | Status: DC | PRN

## 2018-01-29 MED ORDER — LIDOCAINE HCL 1 % IJ SOLN
INTRAMUSCULAR | Status: AC
  Filled 2018-01-29: qty 20

## 2018-01-29 MED ORDER — MAGNESIUM SULFATE 2 GM/50ML IV SOLN
2.0000 g | Freq: Once | INTRAVENOUS | Status: AC
  Administered 2018-01-29: 2 g via INTRAVENOUS
  Filled 2018-01-29: qty 50

## 2018-01-29 MED ORDER — POTASSIUM CHLORIDE CRYS ER 20 MEQ PO TBCR
40.0000 meq | EXTENDED_RELEASE_TABLET | Freq: Once | ORAL | Status: AC
  Administered 2018-01-29: 40 meq via ORAL
  Filled 2018-01-29: qty 2

## 2018-01-29 MED ORDER — CYCLOBENZAPRINE HCL 5 MG PO TABS
5.0000 mg | ORAL_TABLET | Freq: Once | ORAL | Status: AC
  Administered 2018-01-29: 5 mg via ORAL
  Filled 2018-01-29: qty 1

## 2018-01-29 MED ORDER — TRAMADOL HCL 50 MG PO TABS
100.0000 mg | ORAL_TABLET | Freq: Four times a day (QID) | ORAL | Status: DC | PRN
  Administered 2018-01-29 – 2018-01-30 (×3): 100 mg via ORAL
  Filled 2018-01-29 (×4): qty 2

## 2018-01-29 NOTE — Progress Notes (Signed)
PROGRESS NOTE    Kristopher Johnson  QVZ:[id number] DOB: 08-Jul-1956 DOA: 01/27/2018 PCP: Patient, No Pcp Per    Brief Narrative:  Kristopher Johnson 62 year old male with medical history of mantle cell lymphoma currently on Ibrunitib.  Presented to the emergency department complaining of shortness of breath for 4 days prior to admission.  Upon ED evaluation patient was in tripod position unable to lie flat checks x-ray showing increased size of large left pleural effusion with compressive atelectasis.  Patient was hemodynamically stable and was admitted with working diagnosis of recurrent malignant pleural effusion.  Oncology has been consulted.      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 Repeat CXR shows some atelectasis on the left base.  Minimal effusion.  Oncology consult pending.  Echocardiogram not sig for acute pathology.  IR req for pleurex catheter.  So far cultures from the pleural fluid neg.  Cytology pending.    Mantle cell lymphoma: Follows with Dr Kristopher Johnson as outpatient.    Hypokalemia. Hypomagnesemia: Replaced.    Severe protein calorie malnutrition: dietary consult.   DVT prophylaxis: scd's Code Status: full code.  Family Communication: family at bedside.  Disposition Plan: PENDING EVAL FOR PLEUREX.    Consultants:   [place].   Procedures: THORACENTESIS.    Antimicrobials: none.    Subjective: Reports tenderness at the site of the thoracentesis.  No chest pain. And some sob present.   Objective: Vitals:   01/29/18 1218 01/29/18 1249 01/29/18 1256 01/29/18 1410  BP: 126/81 131/67 126/64 128/72  Pulse:    90  Resp:    18  Temp:    98 F (36.7 C)  TempSrc:    Oral  SpO2:    93%  Weight:      Height:        Intake/Output Summary (Last 24 hours) at 01/29/2018 1509 Last data filed at 01/29/2018 0900 Gross per 24  hour  Intake 490 ml  Output 500 ml  Net -10 ml   Filed Weights   01/27/18 1124 01/27/18 1839  Weight: 94.8 kg (209 lb) 86.1 kg (189 lb 14.4 oz)    Examination:  General exam: Appears calm and comfortable , not on Oxygen.  Respiratory system: air entry fair. No wheezing or rhonchi.  Cardiovascular system: S1 & S2 heard, RRR. No JVD, murmurs,  No pedal edema. Gastrointestinal system: Abdomen is nondistended, soft and nontender. No organomegaly or masses felt. Normal bowel sounds heard. Central nervous system: Alert and oriented. No focal neurological deficits. Extremities: Symmetric 5 x 5 power. Skin: No rashes, lesions or ulcers Psychiatry: Mood & affect appropriate.     Data Reviewed: I have personally reviewed following labs and imaging studies  CBC: Recent Labs  Lab 01/27/18 1340 01/28/18 0433 01/29/18 0540  WBC 6.5 7.6 8.0  NEUTROABS 5.4  --  6.7  HGB 13.9 13.6 13.6  HCT 44.1 43.2 42.9  MCV 92.8 93.1 92.9  PLT 91* 82* 79*   Basic Metabolic Panel: Recent Labs  Lab 01/27/18 1340 01/28/18 0433 01/29/18 0540  NA 143 142 140  K 3.3* 3.7 3.4*  CL 107 106 105  CO2 28 27 26   GLUCOSE 241* 134* 144*  BUN 9 10 9   CREATININE 0.95 0.96 0.80  CALCIUM 8.7* 8.6* 8.3*  MG 1.8  --  1.6*   GFR: Estimated Creatinine Clearance: 99.6 mL/min (by C-G  formula based on SCr of 0.8 mg/dL). Liver Function Tests: Recent Labs  Lab 01/27/18 1340 01/28/18 0433  AST 15 12*  ALT 13* 12*  ALKPHOS 72 59  BILITOT 0.6 0.7  PROT 6.3* 5.4*  ALBUMIN 3.6 3.0*   No results for input(s): LIPASE, AMYLASE in the last 168 hours. No results for input(s): AMMONIA in the last 168 hours. Coagulation Profile: Recent Labs  Lab 01/27/18 1340 01/28/18 0433  INR 0.95 1.04   Cardiac Enzymes: Recent Labs  Lab 01/27/18 1340  TROPONINI <0.03   BNP (last 3 results) No results for input(s): PROBNP in the last 8760 hours. HbA1C: No results for input(s): HGBA1C in the last 72 hours. CBG: No  results for input(s): GLUCAP in the last 168 hours. Lipid Profile: No results for input(s): CHOL, HDL, LDLCALC, TRIG, CHOLHDL, LDLDIRECT in the last 72 hours. Thyroid Function Tests: No results for input(s): TSH, T4TOTAL, FREET4, T3FREE, THYROIDAB in the last 72 hours. Anemia Panel: No results for input(s): VITAMINB12, FOLATE, FERRITIN, TIBC, IRON, RETICCTPCT in the last 72 hours. Sepsis Labs: No results for input(s): PROCALCITON, LATICACIDVEN in the last 168 hours.  Recent Results (from the past 240 hour(s))  Culture, body fluid-bottle     Status: None (Preliminary result)   Collection Time: 01/27/18 11:16 AM  Result Value Ref Range Status   Specimen Description PLEURAL LEFT  Final   Special Requests NONE  Final   Culture   Final    NO GROWTH < 24 HOURS Performed at [facility], [address] [place], [place] [address]    Report Status PENDING  Incomplete  Gram stain     Status: None   Collection Time: 01/27/18 11:16 AM  Result Value Ref Range Status   Specimen Description PLEURAL LEFT  Final   Special Requests NONE  Final   Gram Stain   Final    MODERATE WBC PRESENT, PREDOMINANTLY MONONUCLEAR NO ORGANISMS SEEN Performed at [facility], [address], [place], [place] [address]    Report Status 01/28/2018 FINAL  Final         Radiology Studies: Dg Chest 1 View  Result Date: 01/29/2018 CLINICAL DATA:  Post left thoracentesis EXAM: CHEST  1 VIEW COMPARISON:  01/28/2018 FINDINGS: Small to moderate residual left effusion following thoracentesis. No pneumothorax. Port-A-Cath is in place, unchanged. Heart is upper limits normal in size. Left base atelectasis or infiltrate, improved since prior study. IMPRESSION: Improving left pleural effusion and left base atelectasis or infiltrate. No pneumothorax. Electronically Signed   By: Rolm Baptise M.D.   On: 01/29/2018 13:24   Dg Chest 1 View  Result Date: 01/27/2018 CLINICAL DATA:  62 year old male status post left  thoracentesis EXAM: CHEST  1 VIEW COMPARISON:  01/27/2018 FINDINGS: Cardiomediastinal silhouette likely unchanged with the left heart border partially obscured by residual opacity at the left lung base. Compared to the prior there is improved aeration with decreased opacity on the left. No pneumothorax. Right lung relatively well aerated. Port catheter on the right chest wall, unchanged. No displaced fracture. IMPRESSION: No complicating features status post left-sided thoracentesis. Residual opacity at the left base, likely a combination of residual fluid, atelectasis/consolidation, and soft tissue in this patient with known lymphoma. Electronically Signed   By: Corrie Mckusick D.O.   On: 01/27/2018 17:42   Ct Angio Chest Pe W Or Wo Contrast  Result Date: 01/28/2018 CLINICAL DATA:  Five-day history of shortness of breath. History of non-Hodgkin's lymphoma. EXAM: CT ANGIOGRAPHY  CHEST WITH CONTRAST TECHNIQUE: Multidetector CT imaging of the chest was performed using the standard protocol during bolus administration of intravenous contrast. Multiplanar CT image reconstructions and MIPs were obtained to evaluate the vascular anatomy. CONTRAST:  146mL ISOVUE-370 IOPAMIDOL (ISOVUE-370) INJECTION 76% COMPARISON:  PET-CT 10/25/2017 FINDINGS: Cardiovascular: The heart is normal in size. No pericardial effusion. The aorta is normal in caliber. No dissection. The branch vessels are patent. The pulmonary arterial tree is fairly well opacified. Breathing motion artifact limits examination but no definite filling defects to suggest pulmonary embolism. Mediastinum/Nodes: Much improved right axillary and subpectoral adenopathy. The mediastinal and hilar adenopathy is also much improved. 10 mm precarinal lymph node on image number 42 previously measured 16 mm. The 22 mm subcarinal nodal mass seen on the prior study has resolved. Lungs/Pleura: Large pleural lesions have significantly improved since the prior examination. The  pleural lesion or internal mammary adenopathy on the left side measures 25 mm and previously measured 37 mm. The large pleural mass on the left side slightly more inferiorly has completely resolved. The right internal mammary adenopathy has largely resolved. The pleural mass at the left lung base laterally on image number 74 appears relatively stable. Persistent moderate-sized left pleural effusion with associated enhancing pleural nodularity posteriorly appears somewhat improved. Patchy airspace disease in the left upper lobe and lingula, likely pneumonia. This may account for the patient's shortness of breath. The right lung remains clear.  No right-sided pleural effusion. Upper Abdomen: Stable left hepatic lobe cyst. Stable upper abdominal lymphadenopathy. Musculoskeletal: No significant bony findings. Stable small scattered sclerotic bone lesions, likely benign bone islands. Review of the MIP images confirms the above findings. IMPRESSION: 1. No definite CT findings for pulmonary embolism. Exam is somewhat limited by breathing motion artifact. 2. Left upper lobe and lingula pneumonia. 3. Overall improved lymphadenopathy and left-sided pleural disease. Persistent left effusion. Electronically Signed   By: Marijo Sanes M.D.   On: 01/28/2018 10:33   Dg Chest Port 1 View  Result Date: 01/28/2018 CLINICAL DATA:  Status post left-sided thoracentesis yesterday. History of lymphoma. EXAM: PORTABLE CHEST 1 VIEW COMPARISON:  Postprocedure chest x-ray of January 27, 2018 FINDINGS: The right lung is well-expanded and clear. On the left there is increased density in the mid to lower lung. The left pleural effusion is smaller but atelectasis or infiltrate is more apparent in the lower 1/2 of the left hemithorax today. The heart is not enlarged. The pulmonary vascularity remains prominent centrally. The power port catheter tip projects over the midportion of the SVC. IMPRESSION: Interval increase conspicuity of atelectasis  or pneumonia at the left lung base. Persistent small left pleural effusion. Electronically Signed   By: David  Martinique M.D.   On: 01/28/2018 12:02   US Thoracentesis Asp Pleural Space W/img Guide  Result Date: 01/29/2018 INDICATION: Mantle cell lymphoma. Recurrent malignant pleural effusion. Request for therapeutic thoracentesis. EXAM: ULTRASOUND GUIDED LEFT THORACENTESIS MEDICATIONS: 1% Lidocaine = 10 mL COMPLICATIONS: None immediate. PROCEDURE: An ultrasound guided thoracentesis was thoroughly discussed with the patient and questions answered. The benefits, risks, alternatives and complications were also discussed. The patient understands and wishes to proceed with the procedure. Written consent was obtained. Ultrasound was performed to localize and mark an adequate pocket of fluid in the left chest. The area was then prepped and draped in the normal sterile fashion. 1% Lidocaine was used for local anesthesia. Under ultrasound guidance a 6 Fr Safe-T-Centesis catheter was introduced. Thoracentesis was performed. The catheter was removed and a dressing  applied. FINDINGS: A total of approximately 2.2 liters of clear amber fluid was removed. IMPRESSION: Successful ultrasound guided left thoracentesis yielding 2.2 liters of pleural fluid. No pneumothorax on post procedure chest X-ray. Read by: Gareth Eagle, PA-C Electronically Signed   By: Lucrezia Europe M.D.   On: 01/29/2018 14:05   US Thoracentesis Asp Pleural Space W/img Guide  Result Date: 01/27/2018 INDICATION: Patient with history of mantle cell lymphoma with recurrent malignant left pleural effusion, dyspnea. Request made for diagnostic and therapeutic left thoracentesis. EXAM: ULTRASOUND GUIDED DIAGNOSTIC AND THERAPEUTIC LEFT THORACENTESIS MEDICATIONS: None COMPLICATIONS: None immediate. PROCEDURE: An ultrasound guided thoracentesis was thoroughly discussed with the patient and questions answered. The benefits, risks, alternatives and complications were also  discussed. The patient understands and wishes to proceed with the procedure. Written consent was obtained. Ultrasound was performed to localize and mark an adequate pocket of fluid in the left chest. The area was then prepped and draped in the normal sterile fashion. 1% Lidocaine was used for local anesthesia. Under ultrasound guidance a 6 Fr Safe-T-Centesis catheter was introduced. Thoracentesis was performed. The catheter was removed and a dressing applied. FINDINGS: A total of approximately 2.8 liters of blood-tinged fluid was removed. Samples were sent to the laboratory as requested by the clinical team. IMPRESSION: Successful ultrasound guided diagnostic and therapeutic left thoracentesis yielding 2.8 liters of pleural fluid. Read by: Rowe Shin, PA-C Electronically Signed   By: Corrie Mckusick D.O.   On: 01/27/2018 17:16        Scheduled Meds: . lidocaine      . sodium chloride flush  3 mL Intravenous Q12H   Continuous Infusions: . sodium chloride       LOS: 2 days    Time spent: 35 minutes    Hosie Poisson, MD [facility] Pager [phone] If 7PM-7AM, please contact night-coverage [email or web address] Password [facility] 01/29/2018, 3:09 PM

## 2018-01-29 NOTE — Procedures (Signed)
PROCEDURE SUMMARY:  Successful US guided left thoracentesis. Yielded 2.2 liters of dark amber fluid. Patient tolerated procedure well. No immediate complications. Post procedure chest X-ray is pending.  Mckinnley Cottier S Ikechukwu Cerny PA-C 01/29/2018 1:20 PM

## 2018-01-29 NOTE — H&P (Signed)
Chief Complaint: Malignant pleural effusion  Referring Physician(s): Doreatha Lew, MD Sullivan Lone, MD  Supervising Physician: Arne Cleveland  Patient Status: [facility] - In-pt  History of Present Illness: Kristopher Johnson is a 62 y.o. male who is known to our service for recent thoracentesis.  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  Dr. Irene Limbo is his oncologist.  He was last seen by him on 12/26/17.  He presented to the ED on 01/27/2018 with shortness of breath.  He stated it started 4 days prior and was progressively getting worse.  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   Denies any fever chills rigors. Denies any associated palpitations syncopal or near syncopal episodes. He denies any significant dependent edema.  He does smoke a pack a day. He does report some weight loss.  He had a thoracentesis back in January and the fluid was positive for malignant cells.  He had another thoracentesis on Monday with 2.8 liters removed.  We are asked to evaluate him for placement of a tunneled pleural catheter.  Past Medical History:  Diagnosis Date  . Abscess of arm, right 10/27/2015  . Lymphoma of lymph nodes of multiple sites ([place]) 10/27/2015  . 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 Shortness of breath 10/27/2015    Past Surgical History:  Procedure Laterality Date  . Cataract surgery     Patient notes she has had bilateral Surgery in 1998 and 99  . FLEXIBLE SIGMOIDOSCOPY N/A 03/01/2017   Procedure: FLEXIBLE SIGMOIDOSCOPY;  Surgeon: Manus Gunning, MD;  Location: [facility];  Service: Gastroenterology;  Laterality: N/A;  . TONSILLECTOMY     about 62years old    Allergies: Patient has no known  allergies.  Medications: Prior to Admission medications   Medication Sig Start Date End Date Taking? Authorizing Provider  Ibrutinib 560 MG TABS Take 560 mg by mouth daily. Take with a full glass of water at approximately the same time daily, maintain hydration. 11/28/17  Yes Brunetta Genera, MD  lidocaine-prilocaine (EMLA) cream Apply 1 application topically as needed. Patient not taking: Reported on 01/27/2018 11/15/15   Brunetta Genera, MD  prochlorperazine (COMPAZINE) 10 MG tablet Take 1 tablet (10 mg total) by mouth every 6 (six) hours as needed (Nausea or vomiting). 03/13/17 03/13/17  Brunetta Genera, MD     Family History  Problem Relation Age of Onset  . Colon cancer Neg Hx     Social History   Socioeconomic History  . Marital status: Widowed    Spouse name: Not on file  . Number of children: Not on file  . Years of education: Not on file  . Highest education level: Not on file  Occupational History  . Not on file  Social Needs  . Financial resource strain: Not on file  . Food insecurity:    Worry: Not on file    Inability: Not on file  . Transportation needs:    Medical: Not on file    Non-medical: Not on file  Tobacco Use  . Smoking status: Current Every Day Smoker    Packs/day: 1.00    Years: 33.00    Pack years: 33.00    Types: Cigarettes  .  Smokeless tobacco: Never Used  Substance and Sexual Activity  . Alcohol use: No  . Drug use: No  . Sexual activity: Not on file    Comment: Disabled, retinis pigmentosa legally blind, 4children [employer] Lynnn(cousin)  Lifestyle  . Physical activity:    Days per week: Not on file    Minutes per session: Not on file  . Stress: Not on file  Relationships  . Social connections:    Talks on phone: Not on file    Gets together: Not on file    Attends religious service: Not on file    Active member of club or organization: Not on file    Attends meetings of clubs or organizations: Not on file    Relationship  status: Not on file  Other Topics Concern  . Not on file  Social History Narrative  . Not on file     Review of Systems: A 12 point ROS discussed and pertinent positives are indicated in the HPI above.  All other systems are negative. Review of Systems  Vital Signs: BP 126/64 (BP Location: Left Arm)   Pulse 97   Temp 98.3 F (36.8 C) (Oral)   Resp 18   Ht 5\' 8"  (1.727 m)   Wt 189 lb 14.4 oz (86.1 kg)   SpO2 93%   BMI 28.87 kg/m   Physical Exam  Constitutional: He is oriented to person, place, and time. He appears well-developed.  HENT:  Head: Normocephalic and atraumatic.  Eyes: EOM are normal.  Neck: Normal range of motion.  Cardiovascular: Normal rate, regular rhythm and normal heart sounds.  Pulmonary/Chest: Effort normal.  Breath sounds diminished on the left  Abdominal: Soft. He exhibits no distension. There is no tenderness.  Musculoskeletal: Normal range of motion.  Neurological: He is alert and oriented to person, place, and time.  Skin: Skin is warm and dry.  Psychiatric: He has a normal mood and affect. His behavior is normal. Judgment and thought content normal.  Vitals reviewed.   Imaging: Dg Chest 1 View  Result Date: 01/27/2018 CLINICAL DATA:  62 year old male status post left thoracentesis EXAM: CHEST  1 VIEW COMPARISON:  01/27/2018 FINDINGS: Cardiomediastinal silhouette likely unchanged with the left heart border partially obscured by residual opacity at the left lung base. Compared to the prior there is improved aeration with decreased opacity on the left. No pneumothorax. Right lung relatively well aerated. Port catheter on the right chest wall, unchanged. No displaced fracture. IMPRESSION: No complicating features status post left-sided thoracentesis. Residual opacity at the left base, likely a combination of residual fluid, atelectasis/consolidation, and soft tissue in this patient with known lymphoma. Electronically Signed   By: Corrie Mckusick D.O.   On:  01/27/2018 17:42   Dg Chest 2 View  Result Date: 01/27/2018 CLINICAL DATA:  Worsening shortness of breath for 4 days. Mantle cell lymphoma. EXAM: CHEST - 2 VIEW COMPARISON:  11/28/2017 FINDINGS: Large left pleural effusion has increased in size since previous study, with compressive atelectasis of the left lung. Right lung remains clear. Heart size appears stable. Right-sided power port remains in appropriate position. IMPRESSION: Increased size of large left pleural effusion and compressive atelectasis. Electronically Signed   By: Earle Gell M.D.   On: 01/27/2018 12:15   Ct Angio Chest Pe W Or Wo Contrast  Result Date: 01/28/2018 CLINICAL DATA:  Five-day history of shortness of breath. History of non-Hodgkin's lymphoma. EXAM: CT ANGIOGRAPHY CHEST WITH CONTRAST TECHNIQUE: Multidetector CT imaging of the chest was  performed using the standard protocol during bolus administration of intravenous contrast. Multiplanar CT image reconstructions and MIPs were obtained to evaluate the vascular anatomy. CONTRAST:  180mL ISOVUE-370 IOPAMIDOL (ISOVUE-370) INJECTION 76% COMPARISON:  PET-CT 10/25/2017 FINDINGS: Cardiovascular: The heart is normal in size. No pericardial effusion. The aorta is normal in caliber. No dissection. The branch vessels are patent. The pulmonary arterial tree is fairly well opacified. Breathing motion artifact limits examination but no definite filling defects to suggest pulmonary embolism. Mediastinum/Nodes: Much improved right axillary and subpectoral adenopathy. The mediastinal and hilar adenopathy is also much improved. 10 mm precarinal lymph node on image number 42 previously measured 16 mm. The 22 mm subcarinal nodal mass seen on the prior study has resolved. Lungs/Pleura: Large pleural lesions have significantly improved since the prior examination. The pleural lesion or internal mammary adenopathy on the left side measures 25 mm and previously measured 37 mm. The large pleural mass on  the left side slightly more inferiorly has completely resolved. The right internal mammary adenopathy has largely resolved. The pleural mass at the left lung base laterally on image number 74 appears relatively stable. Persistent moderate-sized left pleural effusion with associated enhancing pleural nodularity posteriorly appears somewhat improved. Patchy airspace disease in the left upper lobe and lingula, likely pneumonia. This may account for the patient's shortness of breath. The right lung remains clear.  No right-sided pleural effusion. Upper Abdomen: Stable left hepatic lobe cyst. Stable upper abdominal lymphadenopathy. Musculoskeletal: No significant bony findings. Stable small scattered sclerotic bone lesions, likely benign bone islands. Review of the MIP images confirms the above findings. IMPRESSION: 1. No definite CT findings for pulmonary embolism. Exam is somewhat limited by breathing motion artifact. 2. Left upper lobe and lingula pneumonia. 3. Overall improved lymphadenopathy and left-sided pleural disease. Persistent left effusion. Electronically Signed   By: Marijo Sanes M.D.   On: 01/28/2018 10:33   Dg Chest Port 1 View  Result Date: 01/28/2018 CLINICAL DATA:  Status post left-sided thoracentesis yesterday. History of lymphoma. EXAM: PORTABLE CHEST 1 VIEW COMPARISON:  Postprocedure chest x-ray of January 27, 2018 FINDINGS: The right lung is well-expanded and clear. On the left there is increased density in the mid to lower lung. The left pleural effusion is smaller but atelectasis or infiltrate is more apparent in the lower 1/2 of the left hemithorax today. The heart is not enlarged. The pulmonary vascularity remains prominent centrally. The power port catheter tip projects over the midportion of the SVC. IMPRESSION: Interval increase conspicuity of atelectasis or pneumonia at the left lung base. Persistent small left pleural effusion. Electronically Signed   By: David  Martinique M.D.   On:  01/28/2018 12:02   US Thoracentesis Asp Pleural Space W/img Guide  Result Date: 01/27/2018 INDICATION: Patient with history of mantle cell lymphoma with recurrent malignant left pleural effusion, dyspnea. Request made for diagnostic and therapeutic left thoracentesis. EXAM: ULTRASOUND GUIDED DIAGNOSTIC AND THERAPEUTIC LEFT THORACENTESIS MEDICATIONS: None COMPLICATIONS: None immediate. PROCEDURE: An ultrasound guided thoracentesis was thoroughly discussed with the patient and questions answered. The benefits, risks, alternatives and complications were also discussed. The patient understands and wishes to proceed with the procedure. Written consent was obtained. Ultrasound was performed to localize and mark an adequate pocket of fluid in the left chest. The area was then prepped and draped in the normal sterile fashion. 1% Lidocaine was used for local anesthesia. Under ultrasound guidance a 6 Fr Safe-T-Centesis catheter was introduced. Thoracentesis was performed. The catheter was removed and a dressing applied.  FINDINGS: A total of approximately 2.8 liters of blood-tinged fluid was removed. Samples were sent to the laboratory as requested by the clinical team. IMPRESSION: Successful ultrasound guided diagnostic and therapeutic left thoracentesis yielding 2.8 liters of pleural fluid. Read by: Rowe Kavaughn, PA-C Electronically Signed   By: Corrie Mckusick D.O.   On: 01/27/2018 17:16    Labs:  CBC: Recent Labs    12/26/17 1029 01/27/18 1340 01/28/18 0433 01/29/18 0540  WBC 7.8 6.5 7.6 8.0  HGB 14.0 13.9 13.6 13.6  HCT 45.0 44.1 43.2 42.9  PLT 146 91* 82* 79*    COAGS: Recent Labs    10/25/17 0844 01/27/18 1340 01/28/18 0433  INR 1.02 0.95 1.04  APTT 33  --   --     BMP: Recent Labs    12/26/17 1029 01/27/18 1340 01/28/18 0433 01/29/18 0540  NA 140 143 142 140  K 3.4* 3.3* 3.7 3.4*  CL 104 107 106 105  CO2 29 28 27 26   GLUCOSE 156* 241* 134* 144*  BUN 9 9 10 9   CALCIUM 9.3 8.7*  8.6* 8.3*  CREATININE 0.95 0.95 0.96 0.80  GFRNONAA >60 >60 >60 >60  GFRAA >60 >60 >60 >60    LIVER FUNCTION TESTS: Recent Labs    11/28/17 0833 12/26/17 1029 01/27/18 1340 01/28/18 0433  BILITOT 0.5 0.7 0.6 0.7  AST 8 8 15  12*  ALT 8 7 13* 12*  ALKPHOS 72 71 72 59  PROT 5.9* 6.2* 6.3* 5.4*  ALBUMIN 3.3* 3.6 3.6 3.0*    TUMOR MARKERS: No results for input(s): AFPTM, CEA, CA199, CHROMGRNA in the last 8760 hours.  Assessment and Plan:  Recurrent malignant pleural effusion.  S/P thoracentesis in January and again on Monday.  Risks and benefits of a tunneled pleural catheter were discussed with the patient including bleeding, infection, damage to adjacent structures, malfunction of the catheter with need for additional procedures.  Risk of infection was discussed in detail.  The patient explained his daily routine to me including "climbing, stooping, crawling" and being exposed to dust and insulation. This puts him at VERY high risk for infection of the catheter.  I explained his infection risk to him. He states "I am not at the point where I need to stop doing this. I want to keep going. I want to keep doing what I do every day".  He would like to hold off on placement of a tunneled pleural catheter until he requires a thoracentesis every week or two.  He tells me he will consider it again then.  For now, he would like to have a thoracentesis as needed.  He understands we can schedule him for this as an outpatient. He will simply need a standing order for US guided thoracentesis.  I will proceed with another thoracentesis today.  Risks and benefits of thoracentesis were discussed with the patient including, but not limited to bleeding, infection, pneumothorax, and that fact that all the fluid may not be removed during today's procedure.  All of the patient's questions were answered, patient is agreeable to proceed. Consent signed and in chart.  Thank you for this  interesting consult.  I greatly enjoyed meeting Kristopher Johnson and look forward to participating in their care.  A copy of this report was sent to the requesting provider on this date.  Electronically Signed: Murrell Redden, PA-C   01/29/2018, 1:01 PM      I spent a total of 40 Minutes in face to  face in clinical consultation, greater than 50% of which was counseling/coordinating care for malignant effusion. Considering pleurx.

## 2018-01-29 NOTE — Progress Notes (Signed)
Patient stated that he was having "sharp,muscle cramps" after "belching". PCP was notified.

## 2018-01-29 NOTE — Progress Notes (Signed)
Patient c/o left flank pain  r/t thoracentesis "on Monday", as per patient. Analgesics and Flexaril given to patient.

## 2018-01-30 MED ORDER — TRAMADOL HCL 50 MG PO TABS: 100.0000 mg | ORAL_TABLET | Freq: Four times a day (QID) | ORAL | 0 refills | Status: DC | PRN

## 2018-01-30 MED ORDER — PANTOPRAZOLE SODIUM 40 MG PO TBEC
40.0000 mg | DELAYED_RELEASE_TABLET | Freq: Every day | ORAL | Status: DC
  Administered 2018-01-30: 40 mg via ORAL
  Filled 2018-01-30: qty 1

## 2018-01-30 MED ORDER — HEPARIN SOD (PORK) LOCK FLUSH 100 UNIT/ML IV SOLN
500.0000 [IU] | INTRAVENOUS | Status: AC | PRN
  Administered 2018-01-30: 500 [IU]

## 2018-01-30 MED ORDER — IBUPROFEN 200 MG PO TABS
400.0000 mg | ORAL_TABLET | Freq: Once | ORAL | Status: AC
  Administered 2018-01-30: 400 mg via ORAL
  Filled 2018-01-30: qty 2

## 2018-01-30 MED ORDER — PANTOPRAZOLE SODIUM 40 MG PO TBEC: 40.0000 mg | DELAYED_RELEASE_TABLET | Freq: Every day | ORAL | 0 refills | Status: DC

## 2018-01-30 MED FILL — PANTOPRAZOLE SOD DR 40 MG T: 40 | 7 days supply | Qty: 7 | Fill #0

## 2018-01-30 NOTE — Care Management Important Message (Signed)
Important Message  Patient Details  Name: Kristopher Johnson MRN: [id number] Date of Birth: Oct 20, 1955   Medicare Important Message Given:  Yes    Kerin Salen 01/30/2018, 11:32 AMImportant Message  Patient Details  Name: Kristopher Johnson MRN: [id number] Date of Birth: [birth year]   Medicare Important Message Given:  Yes    Kerin Salen 01/30/2018, 11:32 AM

## 2018-01-30 NOTE — Plan of Care (Signed)
Discharge instructions reviewed with patient, questions answered, verbalized understanding.  Patient is to contact Dr. Grier Mitts office for follow-up as he currently does not have a follow-up appointment.  Patient verbalizes understanding.  Rx given for Tramadol.  Patient awaiting ride.

## 2018-01-31 ENCOUNTER — Telehealth: Payer: Self-pay

## 2018-01-31 ENCOUNTER — Other Ambulatory Visit: Payer: Self-pay | Admitting: Hematology and Oncology

## 2018-01-31 NOTE — Telephone Encounter (Signed)
Scheduled patient f/u and lab, also called and had ct scheduled. Dixie RN assist patient with med questions and Rx. Per 4/26 walk in.

## 2018-02-01 LAB — CULTURE, BODY FLUID-BOTTLE

## 2018-02-01 LAB — CULTURE, BODY FLUID W GRAM STAIN -BOTTLE: Culture: NO GROWTH

## 2018-02-02 NOTE — Discharge Summary (Signed)
Physician Discharge Summary  Kristopher Johnson ZCH:[id number] DOB: 1956/06/13 DOA: 01/27/2018  PCP: Patient, No Pcp Per  Admit date: 01/27/2018 Discharge date: 01/30/2018  Admitted From: Home.  Disposition:  Home.   Recommendations for Outpatient Follow-up:  1. Follow up with PCP in 1-2 weeks 2. Please obtain BMP/CBC in one week Please follow up [place] as recommended in one week.   Discharge Condition:stable.  CODE STATUS: full code.  Diet recommendation: Heart Healthy /  Brief/Interim Summary: Kristopher C Powell62 year old male with medical history of mantle cell lymphoma currently onIbrunitib.Presented to the emergency department complaining of shortness of breath for 4 days prior to admission. Upon ED evaluation patient was in tripod position unable to lie flat checks x-ray showing increased size of large left pleural effusion with compressive atelectasis. Patient was hemodynamically stable and was admitted with working diagnosis of recurrent malignant pleural effusion. Oncology has been consulted.     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 Repeat CXR shows some atelectasis on the left base.   and Minimal effusion.  Echocardiogram not sig for acute pathology.  IR req for pleurex catheter.  So far cultures from the pleural fluid neg.  Cytology pending and recommend follow up with Dr Irene Limbo.  Patient does not want pleurex catheter at this time.  Pt is breathing better and is off oxygen.     Mantle cell lymphoma: Follows with Dr Irene Limbo as outpatient.    Hypokalemia. Hypomagnesemia: Replaced.    Severe protein calorie malnutrition: dietary consulted and recommendations given.      Discharge Instructions  Discharge Instructions    Diet - low sodium heart healthy   Complete by:  As directed    Discharge instructions   Complete by:  As  directed    Follow up with oncology as recommended.     Allergies as of 01/30/2018   No Known Allergies     Medication List    STOP taking these medications   lidocaine-prilocaine cream Commonly known as:  EMLA     TAKE these medications   Ibrutinib 560 MG Tabs Take 560 mg by mouth daily. Take with a full glass of water at approximately the same time daily, maintain hydration.   pantoprazole 40 MG tablet Commonly known as:  PROTONIX Take 1 tablet (40 mg total) by mouth daily.   traMADol 50 MG tablet Commonly known as:  ULTRAM Take 2 tablets (100 mg total) by mouth every 6 (six) hours as needed for moderate pain or severe pain.       No Known Allergies  Consultations:  IR.    Procedures/Studies: Dg Chest 1 View  Result Date: 01/29/2018 CLINICAL DATA:  Post left thoracentesis EXAM: CHEST  1 VIEW COMPARISON:  01/28/2018 FINDINGS: Small to moderate residual left effusion following thoracentesis. No pneumothorax. Port-A-Cath is in place, unchanged. Heart is upper limits normal in size. Left base atelectasis or infiltrate, improved since prior study. IMPRESSION: Improving left pleural effusion and left base atelectasis or infiltrate. No pneumothorax. Electronically Signed   By: Rolm Baptise M.D.   On: 01/29/2018 13:24   Dg Chest 1 View  Result Date: 01/27/2018 CLINICAL DATA:  62 year old male status post left thoracentesis EXAM: CHEST  1 VIEW COMPARISON:  01/27/2018 FINDINGS: Cardiomediastinal silhouette likely unchanged with the left heart border partially obscured by residual opacity at the left lung base. Compared to the prior there is improved  aeration with decreased opacity on the left. No pneumothorax. Right lung relatively well aerated. Port catheter on the right chest wall, unchanged. No displaced fracture. IMPRESSION: No complicating features status post left-sided thoracentesis. Residual opacity at the left base, likely a combination of residual fluid,  atelectasis/consolidation, and soft tissue in this patient with known lymphoma. Electronically Signed   By: Corrie Mckusick D.O.   On: 01/27/2018 17:42   Dg Chest 2 View  Result Date: 01/27/2018 CLINICAL DATA:  Worsening shortness of breath for 4 days. Mantle cell lymphoma. EXAM: CHEST - 2 VIEW COMPARISON:  11/28/2017 FINDINGS: Large left pleural effusion has increased in size since previous study, with compressive atelectasis of the left lung. Right lung remains clear. Heart size appears stable. Right-sided power port remains in appropriate position. IMPRESSION: Increased size of large left pleural effusion and compressive atelectasis. Electronically Signed   By: Earle Gell M.D.   On: 01/27/2018 12:15   Ct Angio Chest Pe W Or Wo Contrast  Result Date: 01/28/2018 CLINICAL DATA:  Five-day history of shortness of breath. History of non-Hodgkin's lymphoma. EXAM: CT ANGIOGRAPHY CHEST WITH CONTRAST TECHNIQUE: Multidetector CT imaging of the chest was performed using the standard protocol during bolus administration of intravenous contrast. Multiplanar CT image reconstructions and MIPs were obtained to evaluate the vascular anatomy. CONTRAST:  17mL ISOVUE-370 IOPAMIDOL (ISOVUE-370) INJECTION 76% COMPARISON:  PET-CT 10/25/2017 FINDINGS: Cardiovascular: The heart is normal in size. No pericardial effusion. The aorta is normal in caliber. No dissection. The branch vessels are patent. The pulmonary arterial tree is fairly well opacified. Breathing motion artifact limits examination but no definite filling defects to suggest pulmonary embolism. Mediastinum/Nodes: Much improved right axillary and subpectoral adenopathy. The mediastinal and hilar adenopathy is also much improved. 10 mm precarinal lymph node on image number 42 previously measured 16 mm. The 22 mm subcarinal nodal mass seen on the prior study has resolved. Lungs/Pleura: Large pleural lesions have significantly improved since the prior examination. The  pleural lesion or internal mammary adenopathy on the left side measures 25 mm and previously measured 37 mm. The large pleural mass on the left side slightly more inferiorly has completely resolved. The right internal mammary adenopathy has largely resolved. The pleural mass at the left lung base laterally on image number 74 appears relatively stable. Persistent moderate-sized left pleural effusion with associated enhancing pleural nodularity posteriorly appears somewhat improved. Patchy airspace disease in the left upper lobe and lingula, likely pneumonia. This may account for the patient's shortness of breath. The right lung remains clear.  No right-sided pleural effusion. Upper Abdomen: Stable left hepatic lobe cyst. Stable upper abdominal lymphadenopathy. Musculoskeletal: No significant bony findings. Stable small scattered sclerotic bone lesions, likely benign bone islands. Review of the MIP images confirms the above findings. IMPRESSION: 1. No definite CT findings for pulmonary embolism. Exam is somewhat limited by breathing motion artifact. 2. Left upper lobe and lingula pneumonia. 3. Overall improved lymphadenopathy and left-sided pleural disease. Persistent left effusion. Electronically Signed   By: Marijo Sanes M.D.   On: 01/28/2018 10:33   Dg Chest Port 1 View  Result Date: 01/28/2018 CLINICAL DATA:  Status post left-sided thoracentesis yesterday. History of lymphoma. EXAM: PORTABLE CHEST 1 VIEW COMPARISON:  Postprocedure chest x-ray of January 27, 2018 FINDINGS: The right lung is well-expanded and clear. On the left there is increased density in the mid to lower lung. The left pleural effusion is smaller but atelectasis or infiltrate is more apparent in the lower 1/2 of the  left hemithorax today. The heart is not enlarged. The pulmonary vascularity remains prominent centrally. The power port catheter tip projects over the midportion of the SVC. IMPRESSION: Interval increase conspicuity of atelectasis  or pneumonia at the left lung base. Persistent small left pleural effusion. Electronically Signed   By: David  Martinique M.D.   On: 01/28/2018 12:02   US Thoracentesis Asp Pleural Space W/img Guide  Result Date: 01/29/2018 INDICATION: Mantle cell lymphoma. Recurrent malignant pleural effusion. Request for therapeutic thoracentesis. EXAM: ULTRASOUND GUIDED LEFT THORACENTESIS MEDICATIONS: 1% Lidocaine = 10 mL COMPLICATIONS: None immediate. PROCEDURE: An ultrasound guided thoracentesis was thoroughly discussed with the patient and questions answered. The benefits, risks, alternatives and complications were also discussed. The patient understands and wishes to proceed with the procedure. Written consent was obtained. Ultrasound was performed to localize and mark an adequate pocket of fluid in the left chest. The area was then prepped and draped in the normal sterile fashion. 1% Lidocaine was used for local anesthesia. Under ultrasound guidance a 6 Fr Safe-T-Centesis catheter was introduced. Thoracentesis was performed. The catheter was removed and a dressing applied. FINDINGS: A total of approximately 2.2 liters of clear amber fluid was removed. IMPRESSION: Successful ultrasound guided left thoracentesis yielding 2.2 liters of pleural fluid. No pneumothorax on post procedure chest X-ray. Read by: Gareth Eagle, PA-C Electronically Signed   By: Lucrezia Europe M.D.   On: 01/29/2018 14:05   US Thoracentesis Asp Pleural Space W/img Guide  Result Date: 01/27/2018 INDICATION: Patient with history of mantle cell lymphoma with recurrent malignant left pleural effusion, dyspnea. Request made for diagnostic and therapeutic left thoracentesis. EXAM: ULTRASOUND GUIDED DIAGNOSTIC AND THERAPEUTIC LEFT THORACENTESIS MEDICATIONS: None COMPLICATIONS: None immediate. PROCEDURE: An ultrasound guided thoracentesis was thoroughly discussed with the patient and questions answered. The benefits, risks, alternatives and complications were also  discussed. The patient understands and wishes to proceed with the procedure. Written consent was obtained. Ultrasound was performed to localize and mark an adequate pocket of fluid in the left chest. The area was then prepped and draped in the normal sterile fashion. 1% Lidocaine was used for local anesthesia. Under ultrasound guidance a 6 Fr Safe-T-Centesis catheter was introduced. Thoracentesis was performed. The catheter was removed and a dressing applied. FINDINGS: A total of approximately 2.8 liters of blood-tinged fluid was removed. Samples were sent to the laboratory as requested by the clinical team. IMPRESSION: Successful ultrasound guided diagnostic and therapeutic left thoracentesis yielding 2.8 liters of pleural fluid. Read by: Rowe Kee, PA-C Electronically Signed   By: Corrie Mckusick D.O.   On: 01/27/2018 17:16       Subjective: No new complaints  Discharge Exam: Vitals:   01/29/18 2045 01/30/18 0412  BP: (!) 124/59 114/61  Pulse: 83 94  Resp: 18 16  Temp: 98 F (36.7 C) 97.9 F (36.6 C)  SpO2: 91% 92%   Vitals:   01/29/18 1256 01/29/18 1410 01/29/18 2045 01/30/18 0412  BP: 126/64 128/72 (!) 124/59 114/61  Pulse:  90 83 94  Resp:  18 18 16   Temp:  98 F (36.7 C) 98 F (36.7 C) 97.9 F (36.6 C)  TempSrc:  Oral Oral Oral  SpO2:  93% 91% 92%  Weight:      Height:        General: Pt is alert, awake, not in acute distress Cardiovascular: RRR, S1/S2 +, no rubs, no gallops Respiratory: CTA bilaterally, no wheezing, no rhonchi Abdominal: Soft, NT, ND, bowel sounds + Extremities: no edema, no cyanosis  The results of significant diagnostics from this hospitalization (including imaging, microbiology, ancillary and laboratory) are listed below for reference.     Microbiology: Recent Results (from the past 240 hour(s))  Culture, body fluid-bottle     Status: None   Collection Time: 01/27/18 11:16 AM  Result Value Ref Range Status   Specimen Description PLEURAL  LEFT  Final   Special Requests NONE  Final   Culture   Final    NO GROWTH 5 DAYS Performed at [facility], [address], [place], [place] [address]    Report Status 02/01/2018 FINAL  Final  Gram stain     Status: None   Collection Time: 01/27/18 11:16 AM  Result Value Ref Range Status   Specimen Description PLEURAL LEFT  Final   Special Requests NONE  Final   Gram Stain   Final    MODERATE WBC PRESENT, PREDOMINANTLY MONONUCLEAR NO ORGANISMS SEEN Performed at [facility], [place] [address], [place], [place] [address]    Report Status 01/28/2018 FINAL  Final     Labs: BNP (last 3 results) Recent Labs    01/27/18 1340  BNP 5.4   Basic Metabolic Panel: Recent Labs  Lab 01/27/18 1340 01/28/18 0433 01/29/18 0540  NA 143 142 140  K 3.3* 3.7 3.4*  CL 107 106 105  CO2 28 27 26   GLUCOSE 241* 134* 144*  BUN 9 10 9   CREATININE 0.95 0.96 0.80  CALCIUM 8.7* 8.6* 8.3*  MG 1.8  --  1.6*   Liver Function Tests: Recent Labs  Lab 01/27/18 1340 01/28/18 0433  AST 15 12*  ALT 13* 12*  ALKPHOS 72 59  BILITOT 0.6 0.7  PROT 6.3* 5.4*  ALBUMIN 3.6 3.0*   No results for input(s): LIPASE, AMYLASE in the last 168 hours. No results for input(s): AMMONIA in the last 168 hours. CBC: Recent Labs  Lab 01/27/18 1340 01/28/18 0433 01/29/18 0540  WBC 6.5 7.6 8.0  NEUTROABS 5.4  --  6.7  HGB 13.9 13.6 13.6  HCT 44.1 43.2 42.9  MCV 92.8 93.1 92.9  PLT 91* 82* 79*   Cardiac Enzymes: Recent Labs  Lab 01/27/18 1340  TROPONINI <0.03   BNP: Invalid input(s): POCBNP CBG: No results for input(s): GLUCAP in the last 168 hours. D-Dimer No results for input(s): DDIMER in the last 72 hours. Hgb A1c No results for input(s): HGBA1C in the last 72 hours. Lipid Profile No results for input(s): CHOL, HDL, LDLCALC, TRIG, CHOLHDL, LDLDIRECT in the last 72 hours. Thyroid function studies No results for input(s): TSH, T4TOTAL, T3FREE, THYROIDAB in the last 72  hours.  Invalid input(s): FREET3 Anemia work up No results for input(s): VITAMINB12, FOLATE, FERRITIN, TIBC, IRON, RETICCTPCT in the last 72 hours. Urinalysis    Component Value Date/Time   COLORURINE YELLOW 10/24/2015 1705   APPEARANCEUR CLOUDY (A) 10/24/2015 1705   LABSPEC 1.024 10/24/2015 1705   PHURINE 5.0 10/24/2015 1705   GLUCOSEU NEGATIVE 10/24/2015 1705   HGBUR NEGATIVE 10/24/2015 1705   BILIRUBINUR NEGATIVE 10/24/2015 1705   KETONESUR NEGATIVE 10/24/2015 1705   PROTEINUR 30 (A) 10/24/2015 1705   NITRITE NEGATIVE 10/24/2015 1705   LEUKOCYTESUR NEGATIVE 10/24/2015 1705   Sepsis Labs Invalid input(s): PROCALCITONIN,  WBC,  LACTICIDVEN Microbiology Recent Results (from the past 240 hour(s))  Culture, body fluid-bottle     Status: None   Collection Time: 01/27/18 11:16 AM  Result Value Ref Range Status   Specimen Description PLEURAL LEFT  Final  Special Requests NONE  Final   Culture   Final    NO GROWTH 5 DAYS Performed at [facility], [place] [address] [place], [place] [address]    Report Status 02/01/2018 FINAL  Final  Gram stain     Status: None   Collection Time: 01/27/18 11:16 AM  Result Value Ref Range Status   Specimen Description PLEURAL LEFT  Final   Special Requests NONE  Final   Gram Stain   Final    MODERATE WBC PRESENT, PREDOMINANTLY MONONUCLEAR NO ORGANISMS SEEN Performed at [facility], [place] [address], [place], [place] [address]    Report Status 01/28/2018 FINAL  Final     Time coordinating discharge: 32 minutes  SIGNED:   Hosie Poisson, MD  [facility] 02/02/2018, 10:06 PM Pager   If 7PM-7AM, please contact night-coverage [email or web address] Password [email or web address]

## 2018-02-03 ENCOUNTER — Observation Stay (HOSPITAL_COMMUNITY)
Admission: EM | Admit: 2018-02-03 | Discharge: 2018-02-04 | Disposition: A | Discharge status: Home / Self Care | Payer: Medicare Other | Attending: Internal Medicine | Admitting: Internal Medicine

## 2018-02-03 ENCOUNTER — Other Ambulatory Visit: Payer: Self-pay

## 2018-02-03 ENCOUNTER — Encounter (HOSPITAL_COMMUNITY): Payer: Self-pay

## 2018-02-03 ENCOUNTER — Emergency Department (HOSPITAL_COMMUNITY): Payer: Medicare Other

## 2018-02-03 DIAGNOSIS — R0609 Other forms of dyspnea: Secondary | ICD-10-CM | POA: Diagnosis not present

## 2018-02-03 DIAGNOSIS — C8318 Mantle cell lymphoma, lymph nodes of multiple sites: Secondary | ICD-10-CM | POA: Diagnosis not present

## 2018-02-03 DIAGNOSIS — F17210 Nicotine dependence, cigarettes, uncomplicated: Secondary | ICD-10-CM | POA: Insufficient documentation

## 2018-02-03 DIAGNOSIS — J910 Malignant pleural effusion: Secondary | ICD-10-CM | POA: Diagnosis not present

## 2018-02-03 DIAGNOSIS — C8310 Mantle cell lymphoma, unspecified site: Secondary | ICD-10-CM | POA: Insufficient documentation

## 2018-02-03 DIAGNOSIS — Z79899 Other long term (current) drug therapy: Secondary | ICD-10-CM | POA: Diagnosis not present

## 2018-02-03 DIAGNOSIS — Z9889 Other specified postprocedural states: Secondary | ICD-10-CM

## 2018-02-03 DIAGNOSIS — H548 Legal blindness, as defined in USA: Secondary | ICD-10-CM | POA: Diagnosis not present

## 2018-02-03 DIAGNOSIS — J90 Pleural effusion, not elsewhere classified: Secondary | ICD-10-CM | POA: Diagnosis not present

## 2018-02-03 DIAGNOSIS — R0600 Dyspnea, unspecified: Secondary | ICD-10-CM

## 2018-02-03 LAB — I-STAT CHEM 8, ED
BUN: 10 mg/dL (ref 6–20)
Calcium, Ion: 1.12 mmol/L — ABNORMAL LOW (ref 1.15–1.40)
Chloride: 101 mmol/L (ref 101–111)
Creatinine, Ser: 1 mg/dL (ref 0.61–1.24)
Glucose, Bld: 161 mg/dL — ABNORMAL HIGH (ref 65–99)
HEMATOCRIT: 47 % (ref 39.0–52.0)
HEMOGLOBIN: 16 g/dL (ref 13.0–17.0)
POTASSIUM: 3.9 mmol/L (ref 3.5–5.1)
Sodium: 139 mmol/L (ref 135–145)
TCO2: 26 mmol/L (ref 22–32)

## 2018-02-03 LAB — CBC WITH DIFFERENTIAL/PLATELET
BASOS ABS: 0.4 10*3/uL — AB (ref 0.0–0.1)
Basophils Relative: 4 %
EOS PCT: 1 %
Eosinophils Absolute: 0.1 10*3/uL (ref 0.0–0.7)
HEMATOCRIT: 46.3 % (ref 39.0–52.0)
Hemoglobin: 14.7 g/dL (ref 13.0–17.0)
LYMPHS PCT: 7 %
Lymphs Abs: 0.7 10*3/uL (ref 0.7–4.0)
MCH: 29.2 pg (ref 26.0–34.0)
MCHC: 31.7 g/dL (ref 30.0–36.0)
MCV: 92 fL (ref 78.0–100.0)
Monocytes Absolute: 0.8 10*3/uL (ref 0.1–1.0)
Monocytes Relative: 10 %
Neutro Abs: 6.9 10*3/uL (ref 1.7–7.7)
Neutrophils Relative %: 78 %
Platelets: 110 10*3/uL — ABNORMAL LOW (ref 150–400)
RBC: 5.03 MIL/uL (ref 4.22–5.81)
RDW: 14.8 % (ref 11.5–15.5)
WBC: 8.9 10*3/uL (ref 4.0–10.5)

## 2018-02-03 MED ORDER — SENNOSIDES-DOCUSATE SODIUM 8.6-50 MG PO TABS: 1.0000 | ORAL_TABLET | Freq: Every evening | ORAL | Status: DC | PRN

## 2018-02-03 MED ORDER — SODIUM CHLORIDE 0.9% FLUSH
3.0000 mL | Freq: Two times a day (BID) | INTRAVENOUS | Status: DC
  Administered 2018-02-03 – 2018-02-04 (×2): 3 mL via INTRAVENOUS

## 2018-02-03 MED ORDER — HEPARIN SODIUM (PORCINE) 5000 UNIT/ML IJ SOLN
5000.0000 [IU] | Freq: Three times a day (TID) | INTRAMUSCULAR | Status: DC
  Filled 2018-02-03 (×3): qty 1

## 2018-02-03 MED ORDER — IBRUTINIB 560 MG PO TABS: 560.0000 mg | ORAL_TABLET | Freq: Every day | ORAL | Status: DC

## 2018-02-03 MED ORDER — TRAMADOL HCL 50 MG PO TABS
100.0000 mg | ORAL_TABLET | Freq: Four times a day (QID) | ORAL | Status: DC | PRN
  Administered 2018-02-04 (×2): 100 mg via ORAL
  Filled 2018-02-03 (×2): qty 2

## 2018-02-03 MED ORDER — ALBUTEROL SULFATE (2.5 MG/3ML) 0.083% IN NEBU: 2.5000 mg | INHALATION_SOLUTION | RESPIRATORY_TRACT | Status: DC | PRN

## 2018-02-03 MED ORDER — PANTOPRAZOLE SODIUM 40 MG PO TBEC
40.0000 mg | DELAYED_RELEASE_TABLET | Freq: Every day | ORAL | Status: DC
  Filled 2018-02-03 (×2): qty 1

## 2018-02-03 MED ORDER — ACETAMINOPHEN 650 MG RE SUPP: 650.0000 mg | Freq: Four times a day (QID) | RECTAL | Status: DC | PRN

## 2018-02-03 MED ORDER — ACETAMINOPHEN 325 MG PO TABS: 650.0000 mg | ORAL_TABLET | Freq: Four times a day (QID) | ORAL | Status: DC | PRN

## 2018-02-03 NOTE — ED Triage Notes (Signed)
Patient c/o abdominal pain and SOB. Patient reports that he had 5 liters removed from lungs x 4 days ago. Patient is currently receiving chemo for lymphoma.

## 2018-02-03 NOTE — ED Provider Notes (Addendum)
[facility] DEPT Provider Note   CSN: [id number] Arrival date & time: 02/03/18  1042     History   Chief Complaint Chief Complaint  Patient presents with  . Abdominal Pain  . Shortness of Breath  . Cancer patient    HPI Kristopher Johnson is a 62 y.o. male.  HPI  62 year old male with a history of stage for mantle cell lymphoma with spread to the colon currently getting oral chemo and recurrent left-sided pleural effusion comes in with chief complaint of shortness of breath.  Patient was just discharged from the hospital last week when he was admitted for left-sided pleural effusion.  He states that at that time he had close to 5 L drained from his thorax.  Patient started having shortness of breath again yesterday.  Patient has exertional dyspnea and he is unable to lay flat at night.  Patient is having flank pain on the left side along with some chest discomfort which is not new for him.  CT PE during the last visit was negative for acute PE.  Chart review also shows that patient had required thoracentesis January 2019.  Past Medical History:  Diagnosis Date  . Abscess of arm, right 10/27/2015  . Lymphoma of lymph nodes of multiple sites ([facility]) 10/27/2015  . 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 Shortness of breath 10/27/2015    Patient Active Problem List   Diagnosis Date Noted  . Protein-calorie malnutrition, severe 10/29/2017  . 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 Large pleural effusion 10/25/2017  . Counseling regarding advanced care planning and goals of care 03/13/2017  . Rectal mass 03/02/2017  . Colon cancer metastasized to liver and lung(H[facility]) 03/02/2017  . Rectal bleeding 02/28/2017  . Portacath in place 01/30/2016  . Skin lesion of chest wall 12/19/2015  . Shingles 12/19/2015  . Rash 11/28/2015  . 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 Mantle cell  lymphoma of lymph nodes of multiple regions ([facility]) 11/01/2015  . Protein-calorie malnutrition ([facility]) 11/01/2015  . Abscess of arm, right 10/27/2015  . Lymphoma of lymph nodes of multiple sites ([facility]) 10/27/2015  . Shortness of breath 10/27/2015  . Shortness of breath dyspnea 10/27/2015  . Hypoalbuminemia 10/25/2015  . Elevated lipase 10/25/2015  . Inguinal hernia 10/25/2015  . Urinary retention 10/25/2015  . Abdominal pain   . Lymphadenopathy 10/24/2015    Past Surgical History:  Procedure Laterality Date  . Cataract surgery     Patient notes she has had bilateral Surgery in 1998 and 99  . FLEXIBLE SIGMOIDOSCOPY N/A 03/01/2017   Procedure: FLEXIBLE SIGMOIDOSCOPY;  Surgeon: Manus Gunning, MD;  Location: [facility];  Service: Gastroenterology;  Laterality: N/A;  . TONSILLECTOMY     about 62years old        Home Medications    Prior to Admission medications   Medication Sig Start Date End Date Taking? Authorizing Provider  Ibrutinib 560 MG TABS Take 560 mg by mouth daily. Take with a full glass of water at approximately the same time daily, maintain hydration. 11/28/17  Yes Brunetta Genera, MD  pantoprazole (PROTONIX) 40 MG tablet Take 1 tablet (40 mg total) by mouth daily. 01/31/18  Yes Hosie Poisson, MD  traMADol (ULTRAM) 50 MG tablet Take 2 tablets (100 mg total) by mouth every 6 (six) hours as needed for moderate pain or severe pain. 01/30/18  Yes Hosie Poisson, MD  prochlorperazine (COMPAZINE) 10 MG tablet Take 1 tablet (10 mg total) by mouth every 6 (six) hours as needed (Nausea or vomiting). 03/13/17 03/13/17  Brunetta Genera, MD    Family History Family History  Problem Relation Age of Onset  . Colon cancer Neg Hx     Social History Social History   Tobacco Use  . Smoking status: Current Every Day Smoker    Packs/day: 1.00    Years: 33.00    Pack years: 33.00    Types: Cigarettes  . Smokeless tobacco: Never Used  Substance Use Topics  . Alcohol  use: No  . Drug use: No     Allergies   Patient has no known allergies.   Review of Systems Review of Systems  Constitutional: Positive for activity change.  Respiratory: Positive for shortness of breath.   Cardiovascular: Positive for chest pain.  Genitourinary: Positive for flank pain.  All other systems reviewed and are negative.    Physical Exam Updated Vital Signs BP 132/87   Pulse (!) 121   Temp 98.1 F (36.7 C) (Oral)   Resp (!) 21   Ht 5\' 8"  (1.727 m)   Wt 85.7 kg (189 lb)   SpO2 95%   BMI 28.74 kg/m   Physical Exam  Constitutional: He is oriented to person, place, and time. He appears well-developed.  HENT:  Head: Atraumatic.  Neck: Neck supple.  Cardiovascular:  Tachycardia  Pulmonary/Chest:  Diminished breath sounds on the left side, tachypnea  Abdominal: Soft.  No focal tenderness  Neurological: He is alert and oriented to person, place, and time.  Skin: Skin is warm.  Nursing note and vitals reviewed.    ED Treatments / Results  Labs (all labs ordered are listed, but only abnormal results are displayed) Labs Reviewed  CBC WITH DIFFERENTIAL/PLATELET - Abnormal; Notable for the following components:      Result Value   Platelets 110 (*)    All other components within normal limits  I-STAT CHEM 8, ED - Abnormal; Notable for the following components:   Glucose, Bld 161 (*)    Calcium, Ion 1.12 (*)    All other components within normal limits    EKG EKG Interpretation  Date/Time:  Monday February 03 2018 11:48:20 EDT Ventricular Rate:  118 PR Interval:    QRS Duration: 82 QT Interval:  314 QTC Calculation: 440 R Axis:   69 Text Interpretation:  Sinus tachycardia Borderline repolarization abnormality No acute changes Nonspecific ST and T wave abnormality Confirmed by Varney Biles ([id number]) on 02/03/2018 12:01:09 PM   Radiology Dg Chest 2 View  Result Date: 02/03/2018 CLINICAL DATA:  Shortness of breath. EXAM: CHEST - 2 VIEW  COMPARISON:  Radiograph of January 29, 2018. FINDINGS: Mild cardiomegaly is noted. Right lung is clear. Right internal jugular Port-A-Cath is noted with distal tip in expected position of SVC. Large left pleural effusion is noted with underlying atelectasis or infiltrate. Bony thorax is unremarkable. IMPRESSION: Large left pleural effusion is noted with underlying atelectasis or infiltrate. Electronically Signed   By: Marijo Conception, M.D.   On: 02/03/2018 11:44    Procedures Procedures (including critical care time)  Medications Ordered in ED Medications - No data to display   Initial Impression / Assessment and Plan / ED Course  I have reviewed the triage vital signs and the nursing notes.  Pertinent labs & imaging results that were available during my care of the patient were reviewed by me and considered in my  medical decision making (see chart for details).     62 year old male comes in with chief complaint of shortness of breath.  Patient has history of left-sided pleural effusion that has required thoracentesis.  The underlying etiology appears to be his cancer.  On exam patient does have diminished breath sounds on the left side -so he might have had reaccumulation of pleural fluid.  Patient will need admission. No need for emergent thoracentesis, and so that process will be deferred to the admitting team and IR services.  Final Clinical Impressions(s) / ED Diagnoses   Final diagnoses:  Pleural effusion on left  Dyspnea on exertion    ED Discharge Orders    None       Varney Biles, MD 02/03/18 [place], Jenessa Gillingham, MD 02/03/18 1305

## 2018-02-03 NOTE — ED Notes (Signed)
Patient transported to X-ray 

## 2018-02-03 NOTE — ED Notes (Signed)
Bed: WA02 Expected date:  Expected time:  Means of arrival:  Comments: 

## 2018-02-03 NOTE — H&P (Signed)
History and Physical    Kristopher Johnson XBW:[id number] DOB: 06-16-1956 DOA: 02/03/2018  PCP: Patient, No Pcp Per   I have briefly reviewed patients previous medical reports in [facility].  Patient coming from: Home  Chief Complaint: Worsening dyspnea  HPI: Kristopher Johnson is a 62 year old male, lives with family, independent, PMH of mantle cell lymphoma currently on ibrutinib, retinitis pigmentosa with legal blindness, recurrent left pleural effusion, recently hospitalized 01/27/2018-01/30/2018 for malignant recurrent left pleural effusion when he underwent thoracentesis of greater than 5 L, cultures were negative but positive for findings consistent with mantle cell lymphoma, he declined Pleurx catheter placement at that time, now returned to [facility] with progressively worsening dyspnea of 2 days duration.  He denies cough, fever or chills.  He has been having chronic left-sided chest pain, worse with deep inspiration and better on lying in left lateral position.  Dyspnea is worse with minimal exertion but he denies dyspnea at rest or orthopnea.  No leg swelling.  He states that he is supposed to have a PET scan in 2 days and follow-up with oncology a day later.  ED Course: Chest x-ray showed recurrent large left-sided pleural effusion.  Patient afebrile, mildly tachypneic with minimal activity and mild sinus tachycardia.  Lab work shows platelets of 110 but otherwise unremarkable CBCs and BMP on i-STAT.  EDP has consulted interventional radiology for therapeutic left thoracentesis.  Review of Systems:  All other systems reviewed and apart from HPI, are negative.  Past Medical History:  Diagnosis Date  . Abscess of arm, right 10/27/2015  . Lymphoma of lymph nodes of multiple sites ([place]) 10/27/2015  . 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 Shortness of breath 10/27/2015    Past  Surgical History:  Procedure Laterality Date  . Cataract surgery     Patient notes she has had bilateral Surgery in 1998 and 99  . FLEXIBLE SIGMOIDOSCOPY N/A 03/01/2017   Procedure: FLEXIBLE SIGMOIDOSCOPY;  Surgeon: Manus Gunning, MD;  Location: [facility];  Service: Gastroenterology;  Laterality: N/A;  . TONSILLECTOMY     about 62years old    Social History  reports that he has been smoking cigarettes.  He has a 33.00 pack-year smoking history. He has never used smokeless tobacco. He reports that he does not drink alcohol or use drugs.  No Known Allergies  Family History  Problem Relation Age of Onset  . Colon cancer Neg Hx      Prior to Admission medications   Medication Sig Start Date End Date Taking? Authorizing Provider  Ibrutinib 560 MG TABS Take 560 mg by mouth daily. Take with a full glass of water at approximately the same time daily, maintain hydration. 11/28/17  Yes Brunetta Genera, MD  pantoprazole (PROTONIX) 40 MG tablet Take 1 tablet (40 mg total) by mouth daily. 01/31/18  Yes Hosie Poisson, MD  traMADol (ULTRAM) 50 MG tablet Take 2 tablets (100 mg total) by mouth every 6 (six) hours as needed for moderate pain or severe pain. 01/30/18  Yes Hosie Poisson, MD  prochlorperazine (COMPAZINE) 10 MG tablet Take 1 tablet (10 mg total) by mouth every 6 (six) hours as needed (Nausea or vomiting). 03/13/17 03/13/17  Brunetta Genera, MD    Physical Exam: Vitals:   02/03/18 1110 02/03/18 1335 02/03/18 1400 02/03/18 1430  BP:  (!) 96/50 (!) 108/58 117/75  Pulse:  [name] Kitchen)  108 (!) 108 (!) 104  Resp:  (!) 27 (!) 26 (!) 28  Temp:      TempSrc:      SpO2:    91%  Weight: 85.7 kg (189 lb)     Height: 5\' 8"  (1.727 m)         Constitutional: Pleasant middle-aged male, moderately built and nourished, lying comfortably supine in left lateral position. Eyes: PERTLA, lids and conjunctivae normal ENMT: Mucous membranes are moist. Posterior pharynx clear of any exudate or  lesions. Normal dentition.  Neck: supple, no masses, no thyromegaly Respiratory: Reduced breath sounds in left lung fields but no wheezing, rhonchi or crackles.  Clear right lung fields.  No increased work of breathing.  Currently saturating in the mid 90s on room air. Cardiovascular: S1 & S2 heard, regular rate and rhythm, no murmurs / rubs / gallops. No extremity edema. 2+ pedal pulses. No carotid bruits.  Telemetry personally reviewed and shows mild sinus tachycardia in the low 100s. Abdomen: No distension, no tenderness, no masses palpated. No hepatosplenomegaly. Bowel sounds normal.  Musculoskeletal: no clubbing / cyanosis. No joint deformity upper and lower extremities. Good ROM, no contractures. Normal muscle tone.  Skin: no rashes, lesions, ulcers. No induration Neurologic: CN 2-12 grossly intact. Sensation intact, DTR normal. Strength 5/5 in all 4 limbs.  Psychiatric: Normal judgment and insight. Alert and oriented x 3. Normal mood.     Labs on Admission: I have personally reviewed following labs and imaging studies  CBC: Recent Labs  Lab 01/28/18 0433 01/29/18 0540 02/03/18 1151 02/03/18 1155  WBC 7.6 8.0 8.9  --   NEUTROABS  --  6.7 6.9  --   HGB 13.6 13.6 14.7 16.0  HCT 43.2 42.9 46.3 47.0  MCV 93.1 92.9 92.0  --   PLT 82* 79* 110*  --    Basic Metabolic Panel: Recent Labs  Lab 01/28/18 0433 01/29/18 0540 02/03/18 1155  NA 142 140 139  K 3.7 3.4* 3.9  CL 106 105 101  CO2 27 26  --   GLUCOSE 134* 144* 161*  BUN 10 9 10   CREATININE 0.96 0.80 1.00  CALCIUM 8.6* 8.3*  --   MG  --  1.6*  --    Liver Function Tests: Recent Labs  Lab 01/28/18 0433  AST 12*  ALT 12*  ALKPHOS 59  BILITOT 0.7  PROT 5.4*  ALBUMIN 3.0*   Coagulation Profile: Recent Labs  Lab 01/28/18 0433  INR 1.04      Radiological Exams on Admission: Dg Chest 2 View  Result Date: 02/03/2018 CLINICAL DATA:  Shortness of breath. EXAM: CHEST - 2 VIEW COMPARISON:  Radiograph of January 29, 2018. FINDINGS: Mild cardiomegaly is noted. Right lung is clear. Right internal jugular Port-A-Cath is noted with distal tip in expected position of SVC. Large left pleural effusion is noted with underlying atelectasis or infiltrate. Bony thorax is unremarkable. IMPRESSION: Large left pleural effusion is noted with underlying atelectasis or infiltrate. Electronically Signed   By: Marijo Conception, M.D.   On: 02/03/2018 11:44    EKG: Independently reviewed.  Poor tracing with baseline artifacts.  Sinus tachycardia at 118 bpm, normal axis and no acute findings.  Assessment/Plan Principal Problem:   Recurrent pleural effusion on left Active Problems:   Mantle cell lymphoma of lymph nodes of multiple regions [facility])   Legally blind   Malignant pleural effusion     1. Recurrent left malignant pleural effusion: Recently hospitalized and underwent therapeutic  left thoracentesis of 2.2 L on 4/24 and 2.8 L on 4/22.  As per discharge summary, Pleurx catheter was recommended which patient declined.  EDP consulted IR for ultrasound-guided therapeutic left thoracentesis and their input is pending.  I have placed a call into patient's Oncologist to discuss patient's case including consideration for Pleurx catheter.  Patient appears hemodynamically stable and not in distress at rest.  He is not hypoxic currently.  Although chest x-ray shows concerns regarding atelectasis versus infiltrate, clinically patient does not have features suggestive of pneumonia and hence no antibiotic started. 2. Mantle cell lymphoma: Continue home chemotherapy regimen. 3. Thrombocytopenia: Appears chronic and stable. 4. Severe protein calorie malnutrition 5. Retinitis pigmentosa with legal blindness.  DVT prophylaxis: Subcutaneous heparin Code Status: DNR as confirmed with patient Family Communication: None at bed Disposition Plan: DC home pending medical improvement Consults called: Interventional radiology consulted by  EDP Admission status: Patient, observation.   Vernell Leep MD [facility] Pager [phone]  If 7PM-7AM, please contact night-coverage [email or web address] Password [facility]  02/03/2018, 3:56 PM

## 2018-02-03 NOTE — ED Notes (Signed)
ED TO INPATIENT HANDOFF REPORT  Name/Age/Gender Kristopher Johnson 62 y.o. male  Code Status    Code Status Orders  (From admission, onward)        Start     Ordered   02/03/18 1938  Do not attempt resuscitation (DNR)  Continuous    Question Answer Comment  In the event of cardiac or respiratory ARREST Do not call a "code blue"   In the event of cardiac or respiratory ARREST Do not perform Intubation, CPR, defibrillation or ACLS   In the event of cardiac or respiratory ARREST Use medication by any route, position, wound care, and other measures to relive pain and suffering. May use oxygen, suction and manual treatment of airway obstruction as needed for comfort.      02/03/18 1937    Code Status History    Date Active Date Inactive Code Status Order ID Comments User Context   01/27/2018 1832 01/30/2018 1603 Full Code [id number]  [name], MD Inpatient   10/25/2017 1628 11/01/2017 1610 Full Code [id number]  [name], MD Inpatient   03/01/2017 0123 03/02/2017 1336 Full Code [id number]  [name], MD ED   10/24/2015 2317 10/25/2015 2051 Full Code [id number]  [name], MD Inpatient      Home/SNF/Other Home  Chief Complaint SOB; Weakness  Level of Care/Admitting Diagnosis ED Disposition    ED Disposition Condition Comment   Admit  Hospital Area: [place] [[id number]]  Level of Care: Telemetry [5]  Admit to tele based on following criteria: Other see comments  Comments: recurrent left pleural effusion with SOB.  Diagnosis: Recurrent pleural effusion on left [761607]  Admitting Physician: [name] [[id number]]  Attending Physician: Vernell Leep D [[id number]]  PT Class (Do Not Modify): Observation [104]  PT Acc Code (Do Not Modify): Observation [10022]       Medical History Past Medical History:  Diagnosis Date  . Abscess of arm, right 10/27/2015  . Lymphoma of lymph nodes of multiple sites ([place]) 10/27/2015  . Retinitis pigmentosa     Patient notes that he has been declared legally blind since 1983 and is on Social Security disability  . Shortness of breath 10/27/2015    Allergies No Known Allergies  IV Location/Drains/Wounds Patient Lines/Drains/Airways Status   Active Line/Drains/Airways    Name:   Placement date:   Placement time:   Site:   Days:   Implanted Port 11/10/15 Right Chest   11/10/15    --    Chest   816          Labs/Imaging Results for orders placed or performed during the hospital encounter of 02/03/18 (from the past 48 hour(s))  CBC with Differential     Status: Abnormal   Collection Time: 02/03/18 11:51 AM  Result Value Ref Range   WBC 8.9 4.0 - 10.5 K/uL   RBC 5.03 4.22 - 5.81 MIL/uL   Hemoglobin 14.7 13.0 - 17.0 g/dL   HCT 46.3 39.0 - 52.0 %   MCV 92.0 78.0 - 100.0 fL   MCH 29.2 26.0 - 34.0 pg   MCHC 31.7 30.0 - 36.0 g/dL   RDW 14.8 11.5 - 15.5 %   Platelets 110 (L) 150 - 400 K/uL    Comment: REPEATED TO VERIFY SPECIMEN CHECKED FOR CLOTS PLATELET COUNT CONFIRMED BY SMEAR    Neutrophils Relative % 78 %   Neutro Abs 6.9 1.7 - 7.7 K/uL   Lymphocytes Relative 7 %   Lymphs Abs  0.7 0.7 - 4.0 K/uL   Monocytes Relative 10 %   Monocytes Absolute 0.8 0.1 - 1.0 K/uL   Eosinophils Relative 1 %   Eosinophils Absolute 0.1 0.0 - 0.7 K/uL   Basophils Relative 4 %   Basophils Absolute 0.4 (H) 0.0 - 0.1 K/uL   WBC Morphology WHITE COUNT CONFIRMED ON SMEAR     Comment: Performed at [facility] [place] [address] [place], [place] [address]  I-stat chem 8, ed     Status: Abnormal   Collection Time: 02/03/18 11:55 AM  Result Value Ref Range   Sodium 139 135 - 145 mmol/L   Potassium 3.9 3.5 - 5.1 mmol/L   Chloride 101 101 - 111 mmol/L   BUN 10 6 - 20 mg/dL   Creatinine, Ser 1.00 0.61 - 1.24 mg/dL   Glucose, Bld 161 (H) 65 - 99 mg/dL   Calcium, Ion 1.12 (L) 1.15 - 1.40 mmol/L   TCO2 26 22 - 32 mmol/L   Hemoglobin 16.0 13.0 - 17.0 g/dL   HCT 47.0 39.0 - 52.0 %   Dg Chest  2 View  Result Date: 02/03/2018 CLINICAL DATA:  Shortness of breath. EXAM: CHEST - 2 VIEW COMPARISON:  Radiograph of January 29, 2018. FINDINGS: Mild cardiomegaly is noted. Right lung is clear. Right internal jugular Port-A-Cath is noted with distal tip in expected position of SVC. Large left pleural effusion is noted with underlying atelectasis or infiltrate. Bony thorax is unremarkable. IMPRESSION: Large left pleural effusion is noted with underlying atelectasis or infiltrate. Electronically Signed   By: Marijo Conception, M.D.   On: 02/03/2018 11:44    Pending Labs Unresulted Labs (From admission, onward)   Start     Ordered   02/04/18 5465  Basic metabolic panel  Tomorrow morning,   R     02/03/18 1937   02/04/18 0500  CBC  Tomorrow morning,   R     02/03/18 1937      Vitals/Pain Today's Vitals   02/03/18 1700 02/03/18 1730 02/03/18 1800 02/03/18 1830  BP: 108/64 (!) 116/53 (!) 103/58 117/88  Pulse: (!) 113 (!) 114 (!) 110 (!) 127  Resp: (!) 21 (!) 27 (!) 27 (!) 25  Temp:      TempSrc:      SpO2: 93% 92% 91% 91%  Weight:      Height:      PainSc:        Isolation Precautions No active isolations  Medications Medications  Ibrutinib TABS 560 mg (has no administration in time range)  pantoprazole (PROTONIX) EC tablet 40 mg (has no administration in time range)  traMADol (ULTRAM) tablet 100 mg (has no administration in time range)  heparin injection 5,000 Units (has no administration in time range)  sodium chloride flush (NS) 0.9 % injection 3 mL (has no administration in time range)  acetaminophen (TYLENOL) tablet 650 mg (has no administration in time range)    Or  acetaminophen (TYLENOL) suppository 650 mg (has no administration in time range)  senna-docusate (Senokot-S) tablet 1 tablet (has no administration in time range)  albuterol (PROVENTIL) (2.5 MG/3ML) 0.083% nebulizer solution 2.5 mg (has no administration in time range)    Mobility walks with person assist

## 2018-02-03 NOTE — Progress Notes (Signed)
Addendum  I discussed with Dr. Irene Limbo, Oncology who recommended proceeding with IR for therapeutic left thoracentesis.  He will see patient in a.m., plans to undergo PET scan on 5/1, has follow-up appointment with him on 5/2 and he will determine need for Pleurx catheter going forward.  Vernell Leep, MD, FACP, [facility]. [facility] Pager [phone]  If 7PM-7AM, please contact night-coverage [email or web address] Password [facility] 02/03/2018, 5:18 PM

## 2018-02-04 ENCOUNTER — Observation Stay (HOSPITAL_COMMUNITY): Payer: Medicare Other

## 2018-02-04 DIAGNOSIS — J90 Pleural effusion, not elsewhere classified: Secondary | ICD-10-CM | POA: Diagnosis not present

## 2018-02-04 DIAGNOSIS — H548 Legal blindness, as defined in USA: Secondary | ICD-10-CM | POA: Diagnosis not present

## 2018-02-04 DIAGNOSIS — C8318 Mantle cell lymphoma, lymph nodes of multiple sites: Secondary | ICD-10-CM | POA: Diagnosis not present

## 2018-02-04 DIAGNOSIS — J910 Malignant pleural effusion: Secondary | ICD-10-CM

## 2018-02-04 DIAGNOSIS — Z9889 Other specified postprocedural states: Secondary | ICD-10-CM

## 2018-02-04 LAB — BASIC METABOLIC PANEL
Anion gap: 12 (ref 5–15)
BUN: 11 mg/dL (ref 6–20)
CHLORIDE: 102 mmol/L (ref 101–111)
CO2: 26 mmol/L (ref 22–32)
CREATININE: 0.94 mg/dL (ref 0.61–1.24)
Calcium: 8.7 mg/dL — ABNORMAL LOW (ref 8.9–10.3)
GFR calc Af Amer: >60 mL/min (ref >60)
GFR calc non Af Amer: >60 mL/min (ref >60)
Glucose, Bld: 151 mg/dL — ABNORMAL HIGH (ref 65–99)
POTASSIUM: 4 mmol/L (ref 3.5–5.1)
SODIUM: 140 mmol/L (ref 135–145)

## 2018-02-04 LAB — CBC
HEMATOCRIT: 43.3 % (ref 39.0–52.0)
Hemoglobin: 13.9 g/dL (ref 13.0–17.0)
MCH: 29.6 pg (ref 26.0–34.0)
MCHC: 32.1 g/dL (ref 30.0–36.0)
MCV: 92.3 fL (ref 78.0–100.0)
Platelets: 102 10*3/uL — ABNORMAL LOW (ref 150–400)
RBC: 4.69 MIL/uL (ref 4.22–5.81)
RDW: 14.9 % (ref 11.5–15.5)
WBC: 7.5 10*3/uL (ref 4.0–10.5)

## 2018-02-04 MED ORDER — ACETAMINOPHEN 650 MG RE SUPP: 650.0000 mg | Freq: Four times a day (QID) | RECTAL | 0 refills | Status: DC | PRN

## 2018-02-04 MED ORDER — LIDOCAINE HCL 1 % IJ SOLN
INTRAMUSCULAR | Status: AC
  Filled 2018-02-04: qty 20

## 2018-02-04 MED ORDER — HEPARIN SOD (PORK) LOCK FLUSH 100 UNIT/ML IV SOLN
500.0000 [IU] | INTRAVENOUS | Status: AC | PRN
  Administered 2018-02-04: 500 [IU]

## 2018-02-04 NOTE — Procedures (Signed)
Ultrasound-guided therapeutic left thoracentesis performed yielding 2.2 liters of blood-tinged fluid. No immediate complications. Follow-up chest x-ray pending. Due to pt coughing only the above amount of fluid was removed today. Consider pleurx cath placement.

## 2018-02-04 NOTE — Discharge Summary (Signed)
Physician Discharge Summary  Kristopher Johnson HQR:[id number] DOB: 1955/12/03 DOA: 02/03/2018  PCP: Patient, No Pcp Per  Admit date: 02/03/2018 Discharge date: 02/04/2018  Admitted From: Home Disposition:  Home  Recommendations for Outpatient Follow-up and new medication changes:  1. Follow up with PCP in 1- week 2. Patient will have PET scan in am, 02/05/2018 3. Follow up with Dr Irene Limbo on 02/06/18 to determine the need for pleural catheter.   Home Health: no   Equipment/Devices: no    Discharge Condition: stable  CODE STATUS: full  Diet recommendation: Regular.   Brief/Interim Summary: 62 year old male who presented with worsening dyspnea.  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  Recent hospitalization April 22 to January 30, 2018, underwent thoracentesis with 5 L removed.  The fluid was positive for mantle cell lymphoma.  His dyspnea was worsening, mainly on exertion, no associated orthopnea but positive left pleuritic chest pain.  On his initial physical examination blood pressure 117/75, heart rate 104, respiratory 28, oxygen saturation 91%.  Moist mucous membranes, lungs with decreased breath sounds at the left lower lung field, heart S1-S2 present and rhythmic, the abdomen was soft and nontender, no lower extremity edema.  Sodium 139, potassium 3.9, chloride 101, bicarb 26, glucose 161, BUN 10, creatinine 1.0, white count 8.9, hemoglobin 14.7, hematocrit 46.3, platelets 110.  Chest x-ray had a large left pleural effusion.  EKG was sinus tachycardia, normal axis, normal intervals.  Patient was admitted to the hospital with the working diagnosis of recurrent malignant left pleural effusion.  1.  Recurrent malignant left pleural effusion in the setting of mantle cell lymphoma.  Patient was admitted to the medical ward, patient received supplemental oxygen per  nasal cannula, oximetry monitoring.  He underwent ultrasound-guided thoracentesis obtaining 2.2 L of bloody tinged fluid.  Patient had improvement of his symptoms, oxygen saturation 95% on room air.  Patient will follow-up as an outpatient to evaluate the need of pleural catheter.  Case is discussed with Dr. Irene Limbo from oncology.   Follow-up chest film showing a moderate residual loculated effusion with a patchy consolidation of the left base.   2.  Mantle cell lymphoma.  Patient currently on Rituxan, status post R CHOP x6 cycles.  Scheduled for PET scan May 1, follow-up appointment Dr. Irene Limbo May 2, for further treatment options.  Then the need of a pleural catheter will be reevaluated.  3.  Severe protein calorie malnutrition.  Continue outpatient follow-up.  4.  Retinitis pigmentosa.  Patient is legally blind.  5.  Chronic thrombocytopenia.  Stable with no signs of bleeding, discharge platelets 102.   Discharge Diagnoses:  Principal Problem:   Recurrent pleural effusion on left Active Problems:   Mantle cell lymphoma of lymph nodes of multiple regions [facility])   Legally blind   Malignant pleural effusion    Discharge Instructions   Allergies as of 02/04/2018   No Known Allergies     Medication List    TAKE these medications   acetaminophen 650 MG suppository Commonly known as:  TYLENOL Place 1 suppository (650 mg total) rectally every 6 (six) hours as needed for mild pain (or Fever >/= 101).   Ibrutinib 560 MG Tabs Take 560 mg by mouth daily. Take with a full glass of water at approximately the same time daily, maintain hydration.   pantoprazole 40 MG tablet Commonly known as:  PROTONIX Take 1 tablet (  40 mg total) by mouth daily.   traMADol 50 MG tablet Commonly known as:  ULTRAM Take 2 tablets (100 mg total) by mouth every 6 (six) hours as needed for moderate pain or severe pain.       No Known Allergies  Consultations:  IR   Procedures/Studies: Dg Chest 1  View  Result Date: 02/04/2018 CLINICAL DATA:  Status post thoracentesis EXAM: CHEST  1 VIEW COMPARISON:  February 03, 2018 FINDINGS: No pneumothorax. There is moderate loculated effusion on the left currently with patchy consolidation in the left base. Right lung is clear. Heart size and pulmonary vascularity are normal. Port-A-Cath tip is in the superior vena cava. No adenopathy. No bone lesions. IMPRESSION: Moderate residual loculated effusion after thoracentesis. No pneumothorax. There is patchy consolidation in the left base as well. Lungs elsewhere clear. Heart size normal. Electronically Signed   By: Lowella Grip III M.D.   On: 02/04/2018 10:46   Dg Chest 1 View  Result Date: 01/29/2018 CLINICAL DATA:  Post left thoracentesis EXAM: CHEST  1 VIEW COMPARISON:  01/28/2018 FINDINGS: Small to moderate residual left effusion following thoracentesis. No pneumothorax. Port-A-Cath is in place, unchanged. Heart is upper limits normal in size. Left base atelectasis or infiltrate, improved since prior study. IMPRESSION: Improving left pleural effusion and left base atelectasis or infiltrate. No pneumothorax. Electronically Signed   By: Rolm Baptise M.D.   On: 01/29/2018 13:24   Dg Chest 1 View  Result Date: 01/27/2018 CLINICAL DATA:  62 year old male status post left thoracentesis EXAM: CHEST  1 VIEW COMPARISON:  01/27/2018 FINDINGS: Cardiomediastinal silhouette likely unchanged with the left heart border partially obscured by residual opacity at the left lung base. Compared to the prior there is improved aeration with decreased opacity on the left. No pneumothorax. Right lung relatively well aerated. Port catheter on the right chest wall, unchanged. No displaced fracture. IMPRESSION: No complicating features status post left-sided thoracentesis. Residual opacity at the left base, likely a combination of residual fluid, atelectasis/consolidation, and soft tissue in this patient with known lymphoma.  Electronically Signed   By: Corrie Mckusick D.O.   On: 01/27/2018 17:42   Dg Chest 2 View  Result Date: 02/03/2018 CLINICAL DATA:  Shortness of breath. EXAM: CHEST - 2 VIEW COMPARISON:  Radiograph of January 29, 2018. FINDINGS: Mild cardiomegaly is noted. Right lung is clear. Right internal jugular Port-A-Cath is noted with distal tip in expected position of SVC. Large left pleural effusion is noted with underlying atelectasis or infiltrate. Bony thorax is unremarkable. IMPRESSION: Large left pleural effusion is noted with underlying atelectasis or infiltrate. Electronically Signed   By: Marijo Conception, M.D.   On: 02/03/2018 11:44   Dg Chest 2 View  Result Date: 01/27/2018 CLINICAL DATA:  Worsening shortness of breath for 4 days. Mantle cell lymphoma. EXAM: CHEST - 2 VIEW COMPARISON:  11/28/2017 FINDINGS: Large left pleural effusion has increased in size since previous study, with compressive atelectasis of the left lung. Right lung remains clear. Heart size appears stable. Right-sided power port remains in appropriate position. IMPRESSION: Increased size of large left pleural effusion and compressive atelectasis. Electronically Signed   By: Earle Gell M.D.   On: 01/27/2018 12:15   Ct Angio Chest Pe W Or Wo Contrast  Result Date: 01/28/2018 CLINICAL DATA:  Five-day history of shortness of breath. History of non-Hodgkin's lymphoma. EXAM: CT ANGIOGRAPHY CHEST WITH CONTRAST TECHNIQUE: Multidetector CT imaging of the chest was performed using the standard protocol during bolus administration  of intravenous contrast. Multiplanar CT image reconstructions and MIPs were obtained to evaluate the vascular anatomy. CONTRAST:  111mL ISOVUE-370 IOPAMIDOL (ISOVUE-370) INJECTION 76% COMPARISON:  PET-CT 10/25/2017 FINDINGS: Cardiovascular: The heart is normal in size. No pericardial effusion. The aorta is normal in caliber. No dissection. The branch vessels are patent. The pulmonary arterial tree is fairly well opacified.  Breathing motion artifact limits examination but no definite filling defects to suggest pulmonary embolism. Mediastinum/Nodes: Much improved right axillary and subpectoral adenopathy. The mediastinal and hilar adenopathy is also much improved. 10 mm precarinal lymph node on image number 42 previously measured 16 mm. The 22 mm subcarinal nodal mass seen on the prior study has resolved. Lungs/Pleura: Large pleural lesions have significantly improved since the prior examination. The pleural lesion or internal mammary adenopathy on the left side measures 25 mm and previously measured 37 mm. The large pleural mass on the left side slightly more inferiorly has completely resolved. The right internal mammary adenopathy has largely resolved. The pleural mass at the left lung base laterally on image number 74 appears relatively stable. Persistent moderate-sized left pleural effusion with associated enhancing pleural nodularity posteriorly appears somewhat improved. Patchy airspace disease in the left upper lobe and lingula, likely pneumonia. This may account for the patient's shortness of breath. The right lung remains clear.  No right-sided pleural effusion. Upper Abdomen: Stable left hepatic lobe cyst. Stable upper abdominal lymphadenopathy. Musculoskeletal: No significant bony findings. Stable small scattered sclerotic bone lesions, likely benign bone islands. Review of the MIP images confirms the above findings. IMPRESSION: 1. No definite CT findings for pulmonary embolism. Exam is somewhat limited by breathing motion artifact. 2. Left upper lobe and lingula pneumonia. 3. Overall improved lymphadenopathy and left-sided pleural disease. Persistent left effusion. Electronically Signed   By: Marijo Sanes M.D.   On: 01/28/2018 10:33   Dg Chest Port 1 View  Result Date: 01/28/2018 CLINICAL DATA:  Status post left-sided thoracentesis yesterday. History of lymphoma. EXAM: PORTABLE CHEST 1 VIEW COMPARISON:  Postprocedure  chest x-ray of January 27, 2018 FINDINGS: The right lung is well-expanded and clear. On the left there is increased density in the mid to lower lung. The left pleural effusion is smaller but atelectasis or infiltrate is more apparent in the lower 1/2 of the left hemithorax today. The heart is not enlarged. The pulmonary vascularity remains prominent centrally. The power port catheter tip projects over the midportion of the SVC. IMPRESSION: Interval increase conspicuity of atelectasis or pneumonia at the left lung base. Persistent small left pleural effusion. Electronically Signed   By: David  Martinique M.D.   On: 01/28/2018 12:02   US Thoracentesis Asp Pleural Space W/img Guide  Result Date: 01/29/2018 INDICATION: Mantle cell lymphoma. Recurrent malignant pleural effusion. Request for therapeutic thoracentesis. EXAM: ULTRASOUND GUIDED LEFT THORACENTESIS MEDICATIONS: 1% Lidocaine = 10 mL COMPLICATIONS: None immediate. PROCEDURE: An ultrasound guided thoracentesis was thoroughly discussed with the patient and questions answered. The benefits, risks, alternatives and complications were also discussed. The patient understands and wishes to proceed with the procedure. Written consent was obtained. Ultrasound was performed to localize and mark an adequate pocket of fluid in the left chest. The area was then prepped and draped in the normal sterile fashion. 1% Lidocaine was used for local anesthesia. Under ultrasound guidance a 6 Fr Safe-T-Centesis catheter was introduced. Thoracentesis was performed. The catheter was removed and a dressing applied. FINDINGS: A total of approximately 2.2 liters of clear amber fluid was removed. IMPRESSION: Successful ultrasound guided left  thoracentesis yielding 2.2 liters of pleural fluid. No pneumothorax on post procedure chest X-ray. Read by: Gareth Eagle, PA-C Electronically Signed   By: Lucrezia Europe M.D.   On: 01/29/2018 14:05   US Thoracentesis Asp Pleural Space W/img Guide  Result  Date: 01/27/2018 INDICATION: Patient with history of mantle cell lymphoma with recurrent malignant left pleural effusion, dyspnea. Request made for diagnostic and therapeutic left thoracentesis. EXAM: ULTRASOUND GUIDED DIAGNOSTIC AND THERAPEUTIC LEFT THORACENTESIS MEDICATIONS: None COMPLICATIONS: None immediate. PROCEDURE: An ultrasound guided thoracentesis was thoroughly discussed with the patient and questions answered. The benefits, risks, alternatives and complications were also discussed. The patient understands and wishes to proceed with the procedure. Written consent was obtained. Ultrasound was performed to localize and mark an adequate pocket of fluid in the left chest. The area was then prepped and draped in the normal sterile fashion. 1% Lidocaine was used for local anesthesia. Under ultrasound guidance a 6 Fr Safe-T-Centesis catheter was introduced. Thoracentesis was performed. The catheter was removed and a dressing applied. FINDINGS: A total of approximately 2.8 liters of blood-tinged fluid was removed. Samples were sent to the laboratory as requested by the clinical team. IMPRESSION: Successful ultrasound guided diagnostic and therapeutic left thoracentesis yielding 2.8 liters of pleural fluid. Read by: Rowe Vedh, PA-C Electronically Signed   By: Corrie Mckusick D.O.   On: 01/27/2018 17:16       Subjective: Patient is feeling better, dyspnea has improved, no nausea or vomiting, no cough, no fever or chills.   Discharge Exam: Vitals:   02/04/18 1017 02/04/18 1033  BP: 106/61 (!) 119/55  Pulse:    Resp:    Temp:    SpO2:     Vitals:   02/04/18 0600 02/04/18 0953 02/04/18 1017 02/04/18 1033  BP: 104/77 (!) 103/59 106/61 (!) 119/55  Pulse: (!) 110     Resp: 20     Temp: 98.7 F (37.1 C)     TempSrc: Oral     SpO2: 95%     Weight:      Height:        General: Not in pain or dyspnea Neurology: Awake and alert, non focal  E ENT: mild pallor, no icterus, oral mucosa  moist Cardiovascular: No JVD. S1-S2 present, rhythmic, no gallops, rubs, or murmurs. No lower extremity edema. Pulmonary: decreased breath sounds at the right base,  no wheezing, rhonchi or rales. Gastrointestinal. Abdomen with no organomegaly, non tender, no rebound or guarding Skin. No rashes Musculoskeletal: no joint deformities   The results of significant diagnostics from this hospitalization (including imaging, microbiology, ancillary and laboratory) are listed below for reference.     Microbiology: Recent Results (from the past 240 hour(s))  Culture, body fluid-bottle     Status: None   Collection Time: 01/27/18 11:16 AM  Result Value Ref Range Status   Specimen Description PLEURAL LEFT  Final   Special Requests NONE  Final   Culture   Final    NO GROWTH 5 DAYS Performed at [facility], [address], [place], [place] [address]    Report Status 02/01/2018 FINAL  Final  Gram stain     Status: None   Collection Time: 01/27/18 11:16 AM  Result Value Ref Range Status   Specimen Description PLEURAL LEFT  Final   Special Requests NONE  Final   Gram Stain   Final    MODERATE WBC PRESENT, PREDOMINANTLY MONONUCLEAR NO ORGANISMS SEEN Performed at [facility], [place]  [address], [place], [place] [address]    Report Status 01/28/2018 FINAL  Final     Labs: BNP (last 3 results) Recent Labs    01/27/18 1340  BNP 5.4   Basic Metabolic Panel: Recent Labs  Lab 01/29/18 0540 02/03/18 1155 02/04/18 0806  NA 140 139 140  K 3.4* 3.9 4.0  CL 105 101 102  CO2 26  --  26  GLUCOSE 144* 161* 151*  BUN 9 10 11   CREATININE 0.80 1.00 0.94  CALCIUM 8.3*  --  8.7*  MG 1.6*  --   --    Liver Function Tests: No results for input(s): AST, ALT, ALKPHOS, BILITOT, PROT, ALBUMIN in the last 168 hours. No results for input(s): LIPASE, AMYLASE in the last 168 hours. No results for input(s): AMMONIA in the last 168 hours. CBC: Recent Labs  Lab 01/29/18 0540 02/03/18 1151  02/03/18 1155 02/04/18 0806  WBC 8.0 8.9  --  7.5  NEUTROABS 6.7 6.9  --   --   HGB 13.6 14.7 16.0 13.9  HCT 42.9 46.3 47.0 43.3  MCV 92.9 92.0  --  92.3  PLT 79* 110*  --  102*   Cardiac Enzymes: No results for input(s): CKTOTAL, CKMB, CKMBINDEX, TROPONINI in the last 168 hours. BNP: Invalid input(s): POCBNP CBG: No results for input(s): GLUCAP in the last 168 hours. D-Dimer No results for input(s): DDIMER in the last 72 hours. Hgb A1c No results for input(s): HGBA1C in the last 72 hours. Lipid Profile No results for input(s): CHOL, HDL, LDLCALC, TRIG, CHOLHDL, LDLDIRECT in the last 72 hours. Thyroid function studies No results for input(s): TSH, T4TOTAL, T3FREE, THYROIDAB in the last 72 hours.  Invalid input(s): FREET3 Anemia work up No results for input(s): VITAMINB12, FOLATE, FERRITIN, TIBC, IRON, RETICCTPCT in the last 72 hours. Urinalysis    Component Value Date/Time   COLORURINE YELLOW 10/24/2015 1705   APPEARANCEUR CLOUDY (A) 10/24/2015 1705   LABSPEC 1.024 10/24/2015 1705   PHURINE 5.0 10/24/2015 1705   GLUCOSEU NEGATIVE 10/24/2015 1705   HGBUR NEGATIVE 10/24/2015 1705   BILIRUBINUR NEGATIVE 10/24/2015 1705   KETONESUR NEGATIVE 10/24/2015 1705   PROTEINUR 30 (A) 10/24/2015 1705   NITRITE NEGATIVE 10/24/2015 1705   LEUKOCYTESUR NEGATIVE 10/24/2015 1705   Sepsis Labs Invalid input(s): PROCALCITONIN,  WBC,  LACTICIDVEN Microbiology Recent Results (from the past 240 hour(s))  Culture, body fluid-bottle     Status: None   Collection Time: 01/27/18 11:16 AM  Result Value Ref Range Status   Specimen Description PLEURAL LEFT  Final   Special Requests NONE  Final   Culture   Final    NO GROWTH 5 DAYS Performed at [facility], [place] [address] [place], [place] [address]    Report Status 02/01/2018 FINAL  Final  Gram stain     Status: None   Collection Time: 01/27/18 11:16 AM  Result Value Ref Range Status   Specimen Description PLEURAL LEFT  Final    Special Requests NONE  Final   Gram Stain   Final    MODERATE WBC PRESENT, PREDOMINANTLY MONONUCLEAR NO ORGANISMS SEEN Performed at [facility], [place] [address], [place], [place] [address]    Report Status 01/28/2018 FINAL  Final     Time coordinating discharge: 45 minutes  SIGNED:   Tawni Millers, MD  [facility] 02/04/2018, 10:50 AM Pager [phone]  If 7PM-7AM, please contact night-coverage [email or web address] Password [email or web address]

## 2018-02-04 NOTE — Care Management Obs Status (Signed)
[place] NOTIFICATION   Patient Details  Name: Kristopher Johnson MRN: [id number] Date of Birth: Oct 12, 1955   Medicare Observation Status Notification Given:  Yes    Phat Dalton, Benjaman Lobe, RN 02/04/2018, 11:25 AM

## 2018-02-05 ENCOUNTER — Ambulatory Visit (HOSPITAL_COMMUNITY)
Admission: RE | Admit: 2018-02-05 | Discharge: 2018-02-05 | Disposition: A | Discharge status: Home / Self Care | Payer: Medicare Other | Source: Ambulatory Visit | Attending: Hematology | Admitting: Hematology

## 2018-02-05 DIAGNOSIS — C8318 Mantle cell lymphoma, lymph nodes of multiple sites: Secondary | ICD-10-CM

## 2018-02-05 LAB — GLUCOSE, CAPILLARY: Glucose-Capillary: 178 mg/dL — ABNORMAL HIGH (ref 65–99)

## 2018-02-05 MED ORDER — FLUDEOXYGLUCOSE F - 18 (FDG) INJECTION
9.0000 | Freq: Once | INTRAVENOUS | Status: AC | PRN
  Administered 2018-02-05: 9 via INTRAVENOUS

## 2018-02-05 NOTE — Progress Notes (Signed)
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 Patient chooses not to pursue AutoHSCT based strategies which is quite reasonable considering his functional challenges with legal blindness and limited social support.  S/p 6 cycles of Bendamustine/Rituxan    HISTORY OF PRESENTING ILLNESS: plz see clinic note for details on initial presentation  Interval History:   Kristopher Johnson presents today for follow up for his mantle cell lymphoma. The patient's last visit with [place] was on 12/26/17. He is accompanied today by his daughter.    Since the pt's last visit he has presented to the ED for pleural effusion on 01/27/18 and 02/03/18. The pt reports that he has had significant pain on his left flank, upper abdomen, and lower chest. He notes that his Tramadol has not sufficiently addressed his persistent pain.   He notes that his pain has kept him from eating well and he has been losing weight. He has been having increased night sweats as well.   He notes that he has not been taking his Ibrutinib daily since before he was admitted to the hospital on 01/27/18. He has taken his Ibrutinib once since 01/27/18.   Of note since the patient's last visit, pt has had PET completed on 02/05/18 with results revealing 1. Mixed response to therapy. 2. Decrease in size pleural metastasis in the RIGHT and LEFT lungs. Increase in confluent hypermetabolic pleural lesions LEFT hemithorax. 3. Persistent large loculated LEFT pleural effusions. 4. Persistent hypermetabolic mediastinal lymph nodes and  masses 5. 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 6. Marked improvement in mass lesion previous associated with rectum. 7. Additional improvement in lymph nodes in the retroperitoneum and subcutaneous nodes.  Lab results today (02/06/18) of CBC, CMP, and Reticulocytes is as follows: all values are WNL except for WBC at 11.9k, MCHC at 31.8, RDW at 14.7, Platelets at 107k, Glucose at 172, Albumin at 2.9. LDH 02/06/18 is pending.   On review of systems, pt reports significant left sided chest/flank/upper abdomen pain, increased night sweats, eating less, lost weight, and denies any other symptoms.   PAST MEDICAL HISTORY:  Past Medical History:  Diagnosis Date  . Abscess of arm, right 10/27/2015  . Lymphoma of lymph nodes of multiple sites ([place]) 10/27/2015  . 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 Shortness of breath 10/27/2015    SURGICAL HISTORY: Past Surgical History:  Procedure Laterality Date  . Cataract surgery     Patient notes she has had bilateral Surgery in 1998 and 99  . FLEXIBLE SIGMOIDOSCOPY N/A 03/01/2017   Procedure: FLEXIBLE SIGMOIDOSCOPY;  Surgeon: Manus Gunning, MD;  Location: [facility];  Service: Gastroenterology;  Laterality: N/A;  . TONSILLECTOMY     about 62years old    SOCIAL HISTORY: Social History   Socioeconomic History  . Marital status: Widowed    Spouse name: Not on file  . Number of children: Not on file  . Years of education: Not on file  .  Highest education level: Not on file  Occupational History  . Not on file  Social Needs  . Financial resource strain: Not on file  . Food insecurity:    Worry: Not on file    Inability: Not on file  . Transportation needs:    Medical: Not on file    Non-medical: Not on file  Tobacco Use  . Smoking status: Current Every Day Smoker     Packs/day: 1.00    Years: 33.00    Pack years: 33.00    Types: Cigarettes  . Smokeless tobacco: Never Used  Substance and Sexual Activity  . Alcohol use: No  . Drug use: No  . Sexual activity: Not on file    Comment: Disabled, retinis pigmentosa legally blind, 4children MPOA Lynnn(cousin)  Lifestyle  . Physical activity:    Days per week: Not on file    Minutes per session: Not on file  . Stress: Not on file  Relationships  . Social connections:    Talks on phone: Not on file    Gets together: Not on file    Attends religious service: Not on file    Active member of club or organization: Not on file    Attends meetings of clubs or organizations: Not on file    Relationship status: Not on file  . Intimate partner violence:    Fear of current or ex partner: Not on file    Emotionally abused: Not on file    Physically abused: Not on file    Forced sexual activity: Not on file  Other Topics Concern  . Not on file  Social History Narrative  . Not on file    FAMILY HISTORY: Family History  Problem Relation Age of Onset  . Colon cancer Neg Hx     ALLERGIES:  has No Known Allergies.  MEDICATIONS:  Current Outpatient Medications  Medication Sig Dispense Refill  . Ibrutinib 560 MG TABS Take 560 mg by mouth daily. Take with a full glass of water at approximately the same time daily, maintain hydration. (Patient not taking: Reported on 02/06/2018) 28 tablet 3  . pantoprazole (PROTONIX) 40 MG tablet Take 1 tablet (40 mg total) by mouth daily. (Patient not taking: Reported on 02/06/2018) 7 tablet 0  . traMADol (ULTRAM) 50 MG tablet Take 2 tablets (100 mg total) by mouth every 6 (six) hours as needed for moderate pain or severe pain. (Patient not taking: Reported on 02/06/2018) 8 tablet 0   No current facility-administered medications for this visit.     REVIEW OF SYSTEMS:   .10 Point review of Systems was done is negative except as noted above.   PHYSICAL EXAMINATION:  ECOG  PERFORMANCE STATUS: 2 - Symptomatic, <50% confined to bed  Vitals:   02/06/18 0935  BP: (!) 121/53  Pulse: (!) 119  Resp: 18  Temp: 97.6 F (36.4 C)  TempSrc: Oral  SpO2: 96%  Weight: 182 lb 14.4 oz (83 kg)  Height: 5\' 8"  (1.727 m)  . [name] Kitchen GENERAL:alert, in no acute distress and comfortable SKIN: no acute rashes, no significant lesions EYES: conjunctiva are pink and non-injected, sclera anicteric OROPHARYNX: MMM, no exudates, no oropharyngeal erythema or ulceration NECK: supple, no JVD LYMPH:  no palpable lymphadenopathy in the cervical, axillary or inguinal regions LUNGS: decreased breath sounds left lung base HEART: regular rate & rhythm ABDOMEN:  normoactive bowel sounds , non tender, not distended. Extremity: trace pedal edema PSYCH: alert & oriented x 3 with fluent speech  NEURO: no focal motor/sensory deficits   LABORATORY DATA:  I have reviewed the data as listed   CBC Latest Ref Rng & Units 02/06/2018 02/04/2018 02/03/2018  WBC 4.0 - 10.3 K/uL 11.9(H) 7.5 -  Hemoglobin 13.0 - 17.1 g/dL 15.5 13.9 16.0  Hematocrit 38.4 - 49.9 % 48.8 43.3 47.0  Platelets 140 - 400 K/uL 107(L) 102(L) -  HGB 14  . CMP Latest Ref Rng & Units 02/06/2018 02/04/2018 02/03/2018  Glucose 70 - 140 mg/dL 172(H) 151(H) 161(H)  BUN 7 - 26 mg/dL 14 11 10   Creatinine 0.70 - 1.30 mg/dL 1.13 0.94 1.00  Sodium 136 - 145 mmol/L 137 140 139  Potassium 3.5 - 5.1 mmol/L 4.5 4.0 3.9  Chloride 98 - 109 mmol/L 99 102 101  CO2 22 - 29 mmol/L 25 26 -  Calcium 8.4 - 10.4 mg/dL 9.8 8.7(L) -  Total Protein 6.4 - 8.3 g/dL 6.4 - -  Total Bilirubin 0.2 - 1.2 mg/dL 0.6 - -  Alkaline Phos 40 - 150 U/L 94 - -  AST 5 - 34 U/L 11 - -  ALT 0 - 55 U/L 12 - -   . Lab Results  Component Value Date   LDH 560 (H) 02/06/2018    Lab Results  Component Value Date   LDH 132 12/26/2017      RADIOGRAPHIC STUDIES: I have personally reviewed the radiological images as listed and agreed with the findings in the  report. Dg Chest 1 View  Result Date: 02/04/2018 CLINICAL DATA:  Status post thoracentesis EXAM: CHEST  1 VIEW COMPARISON:  February 03, 2018 FINDINGS: No pneumothorax. There is moderate loculated effusion on the left currently with patchy consolidation in the left base. Right lung is clear. Heart size and pulmonary vascularity are normal. Port-A-Cath tip is in the superior vena cava. No adenopathy. No bone lesions. IMPRESSION: Moderate residual loculated effusion after thoracentesis. No pneumothorax. There is patchy consolidation in the left base as well. Lungs elsewhere clear. Heart size normal. Electronically Signed   By: Lowella Grip III M.D.   On: 02/04/2018 10:46   Dg Chest 1 View  Result Date: 01/29/2018 CLINICAL DATA:  Post left thoracentesis EXAM: CHEST  1 VIEW COMPARISON:  01/28/2018 FINDINGS: Small to moderate residual left effusion following thoracentesis. No pneumothorax. Port-A-Cath is in place, unchanged. Heart is upper limits normal in size. Left base atelectasis or infiltrate, improved since prior study. IMPRESSION: Improving left pleural effusion and left base atelectasis or infiltrate. No pneumothorax. Electronically Signed   By: Rolm Baptise M.D.   On: 01/29/2018 13:24   Dg Chest 1 View  Result Date: 01/27/2018 CLINICAL DATA:  62 year old male status post left thoracentesis EXAM: CHEST  1 VIEW COMPARISON:  01/27/2018 FINDINGS: Cardiomediastinal silhouette likely unchanged with the left heart border partially obscured by residual opacity at the left lung base. Compared to the prior there is improved aeration with decreased opacity on the left. No pneumothorax. Right lung relatively well aerated. Port catheter on the right chest wall, unchanged. No displaced fracture. IMPRESSION: No complicating features status post left-sided thoracentesis. Residual opacity at the left base, likely a combination of residual fluid, atelectasis/consolidation, and soft tissue in this patient with known  lymphoma. Electronically Signed   By: Corrie Mckusick D.O.   On: 01/27/2018 17:42   Dg Chest 2 View  Result Date: 02/03/2018 CLINICAL DATA:  Shortness of breath. EXAM: CHEST - 2 VIEW COMPARISON:  Radiograph of January 29, 2018. FINDINGS: Mild cardiomegaly is noted. Right  lung is clear. Right internal jugular Port-A-Cath is noted with distal tip in expected position of SVC. Large left pleural effusion is noted with underlying atelectasis or infiltrate. Bony thorax is unremarkable. IMPRESSION: Large left pleural effusion is noted with underlying atelectasis or infiltrate. Electronically Signed   By: Marijo Conception, M.D.   On: 02/03/2018 11:44   Dg Chest 2 View  Result Date: 01/27/2018 CLINICAL DATA:  Worsening shortness of breath for 4 days. Mantle cell lymphoma. EXAM: CHEST - 2 VIEW COMPARISON:  11/28/2017 FINDINGS: Large left pleural effusion has increased in size since previous study, with compressive atelectasis of the left lung. Right lung remains clear. Heart size appears stable. Right-sided power port remains in appropriate position. IMPRESSION: Increased size of large left pleural effusion and compressive atelectasis. Electronically Signed   By: Earle Gell M.D.   On: 01/27/2018 12:15   Ct Angio Chest Pe W Or Wo Contrast  Result Date: 01/28/2018 CLINICAL DATA:  Five-day history of shortness of breath. History of non-Hodgkin's lymphoma. EXAM: CT ANGIOGRAPHY CHEST WITH CONTRAST TECHNIQUE: Multidetector CT imaging of the chest was performed using the standard protocol during bolus administration of intravenous contrast. Multiplanar CT image reconstructions and MIPs were obtained to evaluate the vascular anatomy. CONTRAST:  164mL ISOVUE-370 IOPAMIDOL (ISOVUE-370) INJECTION 76% COMPARISON:  PET-CT 10/25/2017 FINDINGS: Cardiovascular: The heart is normal in size. No pericardial effusion. The aorta is normal in caliber. No dissection. The branch vessels are patent. The pulmonary arterial tree is fairly well  opacified. Breathing motion artifact limits examination but no definite filling defects to suggest pulmonary embolism. Mediastinum/Nodes: Much improved right axillary and subpectoral adenopathy. The mediastinal and hilar adenopathy is also much improved. 10 mm precarinal lymph node on image number 42 previously measured 16 mm. The 22 mm subcarinal nodal mass seen on the prior study has resolved. Lungs/Pleura: Large pleural lesions have significantly improved since the prior examination. The pleural lesion or internal mammary adenopathy on the left side measures 25 mm and previously measured 37 mm. The large pleural mass on the left side slightly more inferiorly has completely resolved. The right internal mammary adenopathy has largely resolved. The pleural mass at the left lung base laterally on image number 74 appears relatively stable. Persistent moderate-sized left pleural effusion with associated enhancing pleural nodularity posteriorly appears somewhat improved. Patchy airspace disease in the left upper lobe and lingula, likely pneumonia. This may account for the patient's shortness of breath. The right lung remains clear.  No right-sided pleural effusion. Upper Abdomen: Stable left hepatic lobe cyst. Stable upper abdominal lymphadenopathy. Musculoskeletal: No significant bony findings. Stable small scattered sclerotic bone lesions, likely benign bone islands. Review of the MIP images confirms the above findings. IMPRESSION: 1. No definite CT findings for pulmonary embolism. Exam is somewhat limited by breathing motion artifact. 2. Left upper lobe and lingula pneumonia. 3. Overall improved lymphadenopathy and left-sided pleural disease. Persistent left effusion. Electronically Signed   By: Marijo Sanes M.D.   On: 01/28/2018 10:33   Nm Pet Image Restag (ps) Skull Base To Thigh  Result Date: 02/05/2018 CLINICAL DATA:  Subsequent treatment strategy for mantle cell lymphoma, stage IV. EXAM: NUCLEAR MEDICINE PET  SKULL BASE TO THIGH TECHNIQUE: 10.2 mCi F-18 FDG was injected intravenously. Full-ring PET imaging was performed from the skull base to thigh after the radiotracer. CT data was obtained and used for attenuation correction and anatomic localization. Fasting blood glucose: 103 mg/dl COMPARISON:  PET-CT 10/25/2017, 9 /12/18 FINDINGS: Mediastinal blood pool activity: SUV  max 2.4 NECK: No hypermetabolic lymph nodes in the neck. Incidental CT findings: none CHEST: There is decreased thickening of the masslike pleural metastasis in the LEFT hemithorax however there is increase in confluence of the pleural involvement. The near entirety of the LEFT hemithorax pleural surface is now involved by the hypermetabolic malignancy with SUV max equal 10.0. Again individual mass lesion decreased in size. For example 3 cm thick lesion in the anterior aspect of the LEFT upper lobe is decreased to 1 cm. Some lesions remain unchanged. For example mass lesion at the level of the LEFT internal mammary nodes measures 3.4 cm compared to 3.6 cm with SUV max equal 15 not changed. Large loculated LEFT effusion remains. There are intensely hypermetabolic mediastinal lymph nodes and internal mammary lymph nodes on the RIGHT unchanged. Interval resolution of hypermetabolic lymph node in the RIGHT axilla. Resolution of the pleural metastasis in the RIGHT hemithorax. Incidental CT findings: Large loculated effusions in the LEFT hemithorax ABDOMEN/PELVIS: The dominant finding is interval increase in the size the spleen as well as new metabolic activity. Spleen measures 10.8 x 6.8 cm in axial dimension with SUV max equal 10.9 compared to 5.0 by 3.4 cm with no significant metabolic activity background on comparison exam. Conversely, the large mass lesion associated with the rectum is near completely resolved. Lymph nodes in the retroperitoneum have near completely resolved. Example lesion anterior to the RIGHT psoas and IVC previously measured 3.2 cm  is no longer measurable. Additional cutaneous nodules have improved and resolved in the pelvis. Example previous nodule in the soft tissues and muscles anterior to the LEFT femoral head has resolved. Incidental CT findings: none SKELETON: No focal hypermetabolic activity to suggest skeletal metastasis. Incidental CT findings: none IMPRESSION: 1. Mixed response to therapy. 2. Decrease in size pleural metastasis in the RIGHT and LEFT lungs. Increase in confluent hypermetabolic pleural lesions LEFT hemithorax. 3. Persistent large loculated LEFT pleural effusions. 4. Persistent hypermetabolic mediastinal lymph nodes and masses 5. 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 6. Marked improvement in mass lesion previous associated with rectum. 7. Additional improvement in lymph nodes in the retroperitoneum and subcutaneous nodes. Electronically Signed   By: Suzy Bouchard M.D.   On: 02/05/2018 17:36   Dg Chest Port 1 View  Result Date: 01/28/2018 CLINICAL DATA:  Status post left-sided thoracentesis yesterday. History of lymphoma. EXAM: PORTABLE CHEST 1 VIEW COMPARISON:  Postprocedure chest x-ray of January 27, 2018 FINDINGS: The right lung is well-expanded and clear. On the left there is increased density in the mid to lower lung. The left pleural effusion is smaller but atelectasis or infiltrate is more apparent in the lower 1/2 of the left hemithorax today. The heart is not enlarged. The pulmonary vascularity remains prominent centrally. The power port catheter tip projects over the midportion of the SVC. IMPRESSION: Interval increase conspicuity of atelectasis or pneumonia at the left lung base. Persistent small left pleural effusion. Electronically Signed   By: David  Martinique M.D.   On: 01/28/2018 12:02   US Thoracentesis Asp Pleural Space W/img Guide  Result Date: 02/04/2018 INDICATION: Patient with history of mantle cell lymphoma and  recurrent symptomatic malignant left pleural effusion. Request made for therapeutic left thoracentesis. EXAM: ULTRASOUND GUIDED THERAPEUTIC LEFT THORACENTESIS MEDICATIONS: None COMPLICATIONS: None immediate. PROCEDURE: An ultrasound guided thoracentesis was thoroughly discussed with the patient and questions answered. The benefits, risks, alternatives and complications were also discussed. The patient understands and wishes to  proceed with the procedure. Written consent was obtained. Ultrasound was performed to localize and mark an adequate pocket of fluid in the left chest. The area was then prepped and draped in the normal sterile fashion. 1% Lidocaine was used for local anesthesia. Under ultrasound guidance a 6 Fr Safe-T-Centesis catheter was introduced. Thoracentesis was performed. The catheter was removed and a dressing applied. FINDINGS: A total of approximately 2.2 liters of blood-tinged fluid was removed. Due to patient coughing only the above amount of fluid was removed today. IMPRESSION: Successful ultrasound guided therapeutic left thoracentesis yielding 2.2 liters of pleural fluid. Follow-up chest x-ray revealed no pneumothorax. Read by: Rowe Dreyton, PA-C Electronically Signed   By: Markus Daft M.D.   On: 02/04/2018 11:34   US Thoracentesis Asp Pleural Space W/img Guide  Result Date: 01/29/2018 INDICATION: Mantle cell lymphoma. Recurrent malignant pleural effusion. Request for therapeutic thoracentesis. EXAM: ULTRASOUND GUIDED LEFT THORACENTESIS MEDICATIONS: 1% Lidocaine = 10 mL COMPLICATIONS: None immediate. PROCEDURE: An ultrasound guided thoracentesis was thoroughly discussed with the patient and questions answered. The benefits, risks, alternatives and complications were also discussed. The patient understands and wishes to proceed with the procedure. Written consent was obtained. Ultrasound was performed to localize and mark an adequate pocket of fluid in the left chest. The area was then  prepped and draped in the normal sterile fashion. 1% Lidocaine was used for local anesthesia. Under ultrasound guidance a 6 Fr Safe-T-Centesis catheter was introduced. Thoracentesis was performed. The catheter was removed and a dressing applied. FINDINGS: A total of approximately 2.2 liters of clear amber fluid was removed. IMPRESSION: Successful ultrasound guided left thoracentesis yielding 2.2 liters of pleural fluid. No pneumothorax on post procedure chest X-ray. Read by: Gareth Eagle, PA-C Electronically Signed   By: Lucrezia Europe M.D.   On: 01/29/2018 14:05   US Thoracentesis Asp Pleural Space W/img Guide  Result Date: 01/27/2018 INDICATION: Patient with history of mantle cell lymphoma with recurrent malignant left pleural effusion, dyspnea. Request made for diagnostic and therapeutic left thoracentesis. EXAM: ULTRASOUND GUIDED DIAGNOSTIC AND THERAPEUTIC LEFT THORACENTESIS MEDICATIONS: None COMPLICATIONS: None immediate. PROCEDURE: An ultrasound guided thoracentesis was thoroughly discussed with the patient and questions answered. The benefits, risks, alternatives and complications were also discussed. The patient understands and wishes to proceed with the procedure. Written consent was obtained. Ultrasound was performed to localize and mark an adequate pocket of fluid in the left chest. The area was then prepped and draped in the normal sterile fashion. 1% Lidocaine was used for local anesthesia. Under ultrasound guidance a 6 Fr Safe-T-Centesis catheter was introduced. Thoracentesis was performed. The catheter was removed and a dressing applied. FINDINGS: A total of approximately 2.8 liters of blood-tinged fluid was removed. Samples were sent to the laboratory as requested by the clinical team. IMPRESSION: Successful ultrasound guided diagnostic and therapeutic left thoracentesis yielding 2.8 liters of pleural fluid. Read by: Rowe Dorn, PA-C Electronically Signed   By: Corrie Mckusick D.O.   On: 01/27/2018  17:16     ASSESSMENT & PLAN:   62 yo male with   1) h/o previous Stage IVB E Mantle Cell lymphoma with likely gastric involvement, extensive LNadenopathy. ECHO nl EF ECOG PS 2 but activities significantly limited due to being legally blind HIV/Hep C/Hep B neg Baseline LDH 450--->135-->163-->144  PET/CT scan after 3 cycles of R CHOP show good overall response. Stomach lesion still with very FDG uptake ? Residual lymphoma versus inflammation.  Patient is status post 6 cycles of R CHOP. PET/CT  after 6 cycles shows resolution of hypermetabolic lymphadenopathy and gastric wall activity . Patient was on maintenance Rituxan for 1 year now prior to relapse.   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 CT chest abdomen pelvis showed multiple areas of lymphadenopathy in the abdomen as well as in the chest suggesting significant recurrence. PET/CT scan 03/25/2017 shows diffuse involvement with mantle cell lymphoma involving the chest abdomen pelvis and bowels. 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 Workup showed malignant pleural effusion and b/l pleural involvement and rectal urgency due to mantle cell lymphoma causing mass in rectum/sigmoid -PET/CT 10/25/2017 - with significant mantle cell lymphoma progression again. -He was placed on Ibrutinib 560 mg p.o. Daily in 10/2017 and has resolution of most of his symptoms and normalization of elevated LDH levels. . 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 PET/CT 02/05/2018 with mixed treatment response PLAN:  Discussed pt labwork today, 02/06/18; some thrombocytopenia, no anemia, relatively stable blood  counts2 -Discussed 02/05/18 PET scan which revealed a mixed response -Stressed the importance of returning to his Ibrutinib today. -patient appears to be tolerating his Ibrutinib without any acute issues. --Recommended that the pt receive a Pleurex catheter to drain his pleural effusions at home. -Home health for assistance with Pleurex catheter. -Radiation intervention recommended on the lining of the lung for local tumor progression.  -Will add Venetoclax per gradually escalating protocol and restart on Rituxan due to recurrence of CD20 positivity of MCL in pleural effusion.. -Will need to see pt more frequently to monitor addition of Venetoclax.  -Will add Percocet for pain control    3) Retinitis pigmentosa causing legal blindness -  is seeing a retina specialist in town. No acute interventions at this time. Notes that his vision is progressively getting worse. His daughter and granddaughter are helping him out at home.   4) Weight loss and protein calorie malnutrition.  Has gain 6 lbs since last clinic visit -- good po intake Wt Readings from Last 3 Encounters:  02/12/18 182 lb 3.2 oz (82.6 kg)  02/06/18 182 lb 14.4 oz (83 kg)  02/03/18 189 lb (85.7 kg)   5) h/o Peri-orbital dermatitis /Conjuncitivitis - patient was seen by the dermatologist and given topical desonide ointment which led to the resolution of his dermatitis. There were not sure about the etiology but noted then that could not rule out Rituxan as a possibility. Currently his dermatitis has resolved and his conjunctivitis has resolved. He did not have rash anywhere else at the time. No oral sores or other toxicities. This happened about 4 days after Rituxan another chemotherapy. This has completely resolved and patient has not had an further issues with this with several additional doses of Rituxan.  6) Patient Active Problem List   Diagnosis Date Noted  . Recurrent pleural effusion on left 02/03/2018  . Protein-calorie  malnutrition, severe 10/29/2017  . 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 Large pleural effusion 10/25/2017  . Counseling regarding advanced care planning and goals of care 03/13/2017  . Rectal mass 03/02/2017  . Colon cancer metastasized to liver and lung(HCC) 03/02/2017  . Rectal bleeding 02/28/2017  . Portacath in place 01/30/2016  . Skin lesion of chest wall  12/19/2015  . Shingles 12/19/2015  . Rash 11/28/2015  . 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 Protein-calorie malnutrition ([place]) 11/01/2015  . Abscess of arm, right 10/27/2015  . Lymphoma of lymph nodes of multiple sites ([place]) 10/27/2015  . Shortness of breath 10/27/2015  . Shortness of breath dyspnea 10/27/2015  . Hypoalbuminemia 10/25/2015  . Elevated lipase 10/25/2015  . Inguinal hernia 10/25/2015  . Urinary retention 10/25/2015  . Abdominal pain   . Lymphadenopathy 10/24/2015   -continue f/u with PCP for other continued medical issues.   CXR today IR consultation for placement of left sided pleurex catheter for recurrent malignant left pleural effusion Home care referral for pleurex catheter mx Rad Oncology consultation for consideration of palliative left thoracic RT for refractory symptomatic mantle cell lymphomA. Schedule for Rituxan ASAP q3 weeks   . The total time spent in the appointment was 40 minutes and more than 50% was on counseling and direct patient cares and extensive care co-ordination.    Kristopher Lone MD [place] AAHIVMS [facility] Hematology/Oncology Physician [place]  (Office):       [phone] (Work cell):  [phone] (Fax):           [phone]  This document serves as a record of services personally performed by Kristopher Lone, MD. It was created on his behalf by Kristopher Johnson, a trained medical scribe. The creation of this record is based on the scribe's personal observations and the provider's statements to  them.   .I have reviewed the above documentation for accuracy and completeness, and I agree with the above. Kristopher Genera MD MS

## 2018-02-06 ENCOUNTER — Telehealth: Payer: Self-pay | Admitting: Pharmacist

## 2018-02-06 ENCOUNTER — Ambulatory Visit (HOSPITAL_COMMUNITY)
Admission: RE | Admit: 2018-02-06 | Discharge: 2018-02-06 | Disposition: A | Discharge status: Home / Self Care | Payer: Medicare Other | Source: Ambulatory Visit | Attending: Hematology | Admitting: Hematology

## 2018-02-06 ENCOUNTER — Telehealth: Payer: Self-pay | Admitting: Hematology

## 2018-02-06 ENCOUNTER — Encounter: Payer: Self-pay | Admitting: Hematology

## 2018-02-06 ENCOUNTER — Inpatient Hospital Stay: Payer: Medicare Other

## 2018-02-06 ENCOUNTER — Telehealth: Payer: Self-pay | Admitting: Pharmacy Technician

## 2018-02-06 ENCOUNTER — Inpatient Hospital Stay: Payer: Medicare Other | Attending: Hematology | Admitting: Hematology

## 2018-02-06 ENCOUNTER — Other Ambulatory Visit: Payer: Self-pay

## 2018-02-06 VITALS — BP 121/53 | HR 119 | Temp 97.6°F | Resp 18 | Ht 68.0 in | Wt 182.9 lb

## 2018-02-06 DIAGNOSIS — J90 Pleural effusion, not elsewhere classified: Secondary | ICD-10-CM | POA: Insufficient documentation

## 2018-02-06 DIAGNOSIS — C189 Malignant neoplasm of colon, unspecified: Secondary | ICD-10-CM

## 2018-02-06 DIAGNOSIS — G893 Neoplasm related pain (acute) (chronic): Secondary | ICD-10-CM

## 2018-02-06 DIAGNOSIS — C8318 Mantle cell lymphoma, lymph nodes of multiple sites: Secondary | ICD-10-CM

## 2018-02-06 DIAGNOSIS — C787 Secondary malignant neoplasm of liver and intrahepatic bile duct: Principal | ICD-10-CM

## 2018-02-06 DIAGNOSIS — D696 Thrombocytopenia, unspecified: Secondary | ICD-10-CM | POA: Diagnosis not present

## 2018-02-06 DIAGNOSIS — Z79899 Other long term (current) drug therapy: Secondary | ICD-10-CM | POA: Diagnosis not present

## 2018-02-06 DIAGNOSIS — Z5112 Encounter for antineoplastic immunotherapy: Secondary | ICD-10-CM | POA: Diagnosis present

## 2018-02-06 DIAGNOSIS — E43 Unspecified severe protein-calorie malnutrition: Secondary | ICD-10-CM | POA: Insufficient documentation

## 2018-02-06 DIAGNOSIS — H3552 Pigmentary retinal dystrophy: Secondary | ICD-10-CM | POA: Diagnosis not present

## 2018-02-06 DIAGNOSIS — J910 Malignant pleural effusion: Secondary | ICD-10-CM | POA: Insufficient documentation

## 2018-02-06 DIAGNOSIS — H548 Legal blindness, as defined in USA: Secondary | ICD-10-CM | POA: Insufficient documentation

## 2018-02-06 DIAGNOSIS — C782 Secondary malignant neoplasm of pleura: Secondary | ICD-10-CM

## 2018-02-06 DIAGNOSIS — F17210 Nicotine dependence, cigarettes, uncomplicated: Secondary | ICD-10-CM | POA: Diagnosis not present

## 2018-02-06 LAB — CMP (CANCER CENTER ONLY)
ALBUMIN: 2.9 g/dL — AB (ref 3.5–5.0)
ALK PHOS: 94 U/L (ref 40–150)
ALT: 12 U/L (ref 0–55)
ANION GAP: 13 — AB (ref 3–11)
AST: 11 U/L (ref 5–34)
BUN: 14 mg/dL (ref 7–26)
CALCIUM: 9.8 mg/dL (ref 8.4–10.4)
CO2: 25 mmol/L (ref 22–29)
CREATININE: 1.13 mg/dL (ref 0.70–1.30)
Chloride: 99 mmol/L (ref 98–109)
GFR, Est AFR Am: >60 mL/min (ref >60)
GFR, Estimated: >60 mL/min (ref >60)
GLUCOSE: 172 mg/dL — AB (ref 70–140)
Potassium: 4.5 mmol/L (ref 3.5–5.1)
SODIUM: 137 mmol/L (ref 136–145)
Total Bilirubin: 0.6 mg/dL (ref 0.2–1.2)
Total Protein: 6.4 g/dL (ref 6.4–8.3)

## 2018-02-06 LAB — CBC WITH DIFFERENTIAL (CANCER CENTER ONLY)
Band Neutrophils: 0 %
Basophils Absolute: 0 10*3/uL (ref 0.0–0.1)
Basophils Relative: 0 %
Blasts: 0 %
EOS PCT: 0 %
Eosinophils Absolute: 0 10*3/uL (ref 0.0–0.5)
HCT: 48.8 % (ref 38.4–49.9)
HEMOGLOBIN: 15.5 g/dL (ref 13.0–17.1)
LYMPHS ABS: 1 10*3/uL (ref 0.9–3.3)
Lymphocytes Relative: 8 %
MCH: 29.2 pg (ref 27.2–33.4)
MCHC: 31.8 g/dL — AB (ref 32.0–36.0)
MCV: 91.9 fL (ref 79.3–98.0)
MYELOCYTES: 0 %
Metamyelocytes Relative: 0 %
Monocytes Absolute: 0.6 10*3/uL (ref 0.1–0.9)
Monocytes Relative: 5 %
NEUTROS ABS: 10.3 10*3/uL — AB (ref 1.5–6.5)
NEUTROS PCT: 87 %
NRBC: 0 /100{WBCs}
Other: 0 %
PLATELETS: 107 10*3/uL — AB (ref 140–400)
Promyelocytes Relative: 0 %
RBC: 5.31 MIL/uL (ref 4.20–5.82)
RDW: 14.7 % — ABNORMAL HIGH (ref 11.0–14.6)
WBC Count: 11.9 10*3/uL — ABNORMAL HIGH (ref 4.0–10.3)

## 2018-02-06 LAB — RETICULOCYTES
RBC.: 5.31 MIL/uL (ref 4.20–5.82)
RETIC CT PCT: 1.5 % (ref 0.8–1.8)
Retic Count, Absolute: 79.7 10*3/uL (ref 34.8–93.9)

## 2018-02-06 LAB — LACTATE DEHYDROGENASE: LDH: 560 U/L — ABNORMAL HIGH (ref 125–245)

## 2018-02-06 MED ORDER — OXYCODONE-ACETAMINOPHEN 5-325 MG PO TABS: 2.0000 | ORAL_TABLET | Freq: Once | ORAL | Status: DC

## 2018-02-06 MED ORDER — OXYCODONE-ACETAMINOPHEN 5-325 MG PO TABS
2.0000 | ORAL_TABLET | Freq: Once | ORAL | Status: AC
  Administered 2018-02-06: 2 via ORAL

## 2018-02-06 MED ORDER — OXYCODONE-ACETAMINOPHEN 5-325 MG PO TABS
ORAL_TABLET | ORAL | Status: AC
  Filled 2018-02-06: qty 2

## 2018-02-06 MED ORDER — VENETOCLAX 10 & 50 & 100 MG PO TBPK: 20.0000 mg | ORAL_TABLET | Freq: Every day | ORAL | 0 refills | Status: DC

## 2018-02-06 MED ORDER — VENETOCLAX 10 & 50 & 100 MG PO TBPK: ORAL_TABLET | ORAL | 1 refills | Status: DC

## 2018-02-06 MED ORDER — SENNOSIDES-DOCUSATE SODIUM 8.6-50 MG PO TABS: 2.0000 | ORAL_TABLET | Freq: Every day | ORAL | 1 refills | Status: DC

## 2018-02-06 MED ORDER — DEXAMETHASONE 4 MG PO TABS: 20.0000 mg | ORAL_TABLET | Freq: Every day | ORAL | 0 refills | Status: AC

## 2018-02-06 MED ORDER — OXYCODONE HCL 5 MG PO TABS: 5.0000 mg | ORAL_TABLET | ORAL | 0 refills | Status: DC | PRN

## 2018-02-06 MED FILL — oxyCODONE HCL 5 MG TABS: 5 | 9 days supply | Qty: 50 | Fill #0

## 2018-02-06 MED FILL — DEXAMETHASONE 4 MG TABLET: 4 | 3 days supply | Qty: 15 | Fill #0

## 2018-02-06 NOTE — Telephone Encounter (Signed)
Scheduled appt for treatment per 5/2 los - left message for patient with appt date and time. Also tried to call daughters number - no answer/vmail. Sent reminder letter in the mail with appt date and time.

## 2018-02-06 NOTE — Telephone Encounter (Addendum)
Oral Oncology Pharmacist Encounter  Received new prescription for Venclexta (venetoclax) for the treatment of relapsed mantle cell lymphoma in conjunction with Imbruvica (ibrutinib), planned duration until disease progression or unacceptable toxicity. Patient previously started on Imbruvica 560 mg once daily. Imbruvica start date: 10/29/2017  Patient now found to have mixed response to therapy, MD to add Venclexta to therapy with Imbruvica.  Venclexta will be initiated on a dose titration ramp up schedule. Plan is for patient to take 20 mg once daily for 7 days Week 2: Patient will take 50 mg once daily for 7 days Week 3: Patient will take 100 mg once daily for 7 days Week 4: Patient will take 200 mg daily for 7 days Week 5: Patient will take 400 mg daily for 7 days  Maximum dose of Venclexta given daily in mantle cell lymphoma clinical trial in combination with ibrutinib is 800 mg.  Labs from 02/06/18 assessed, OK for treatment.  Patients were excluded for ibrutinib/venetoclax trial with ANC < 750, CrCl < 50 mL/min, pltc < 50k WBC above normal limits, est CrCl ~ 75 mL/min, pltc = 107k  Patient also excluded with baseline arrhythmias or CHF 02/04/18 EKG shows QTc 440 msec, NSR 01/28/18 ECHO shows LVEF 47-42% (vigorous systolic function, grade 1 diastolic dysfunction)  Current medication list in Epic reviewed, no DDIs with venetoclax or ibrutinib identified.  Discussed risk of tumor lysis syndrome with MD.  Patient will need prescription for allopurinol so that he can start anti-hyperuricemic agent 3 days prior to [facility] initiation.  Patient will not be admitted for Venclexta initiation.  MD with low suspicion for TLS due to resistant nature of patient's disease.  MD suspects that it will take multiple dose ramp-ups for any response in patient's tumor burden.  Venetoclax will be initiated outpatient, with baseline labs drawn in the morning prior to venetoclax administration, IV  hydration given here at the office, and labs assessed prior to leaving the office that day. Patient should then return 24 hours after Venclexta dose for repeat labs.  MD plans to start infusional rituximab as soon as he is able. Discussed with MD that when rituximab has been added to venetoclax for other disease states, generally it is added after patient is stable at target venetoclax daily dose.  Prescription has been e-scribed to the [employer] for benefits analysis and approval. Insurance authorization has been submitted, and is pending.  Oral Oncology Clinic will continue to follow for insurance authorization, copayment issues, initial counseling and start date.  Johny Drilling, PharmD, BCPS, BCOP 02/06/2018 1:07 PM Oral Oncology Clinic [phone]

## 2018-02-06 NOTE — Telephone Encounter (Signed)
Unable to schedule 5/2 los - due to capped day - logged - will contact patient when appt is scheduled.

## 2018-02-06 NOTE — Telephone Encounter (Signed)
Oral Oncology Patient Advocate Encounter  Received notification from [employer] that prior authorization for Venclexta is required.  PA submitted on CoverMyMeds Key [id number] Status is pending  Oral Oncology Clinic will continue to follow.  [name]. [name], [place] Patient [place] [phone] 02/06/2018 12:59 PM

## 2018-02-07 ENCOUNTER — Encounter: Payer: Self-pay | Admitting: Radiation Oncology

## 2018-02-07 ENCOUNTER — Telehealth: Payer: Self-pay

## 2018-02-07 NOTE — Telephone Encounter (Signed)
Pt daughter called today to let Dr. Irene Limbo know that oxycodone is not covering the pain, and that the pt would like tramadol instead, which is what he received in the ED. Called pt back to confirm that pt is taking medication as prescribed. Oxycodone should cover the pain better than tramadol. Order written for 1-2 tablets (5-10mg ) oxycodone every 4 hours as needed for severe pain. Called pt back with no answer. Left VM explaining that the pt can take up to 2 oxycodone every four hours as needed for the pain.  Explained that off-site triage line will begin answering calls for the weekend starting at 4:30pm. Delle Reining, RN will be at the desk Monday morning at 8:30am to answer calls if there are any concerns at that time.

## 2018-02-11 NOTE — Progress Notes (Signed)
Lymphoma Location(s) / Histology: Stage IV Mantle cell lymphoma diagnosed in 10/25/2015.   Chest Xray 02/06/2018: Interval re-accumulation of the left pleural effusion over the past 2 days.  No pneumothorax or mediastinal shift.  No CHF.  PET 02/05/2018 1. Mixed response to therapy. 2. Decrease in size pleural metastasis in the RIGHT and LEFT lungs. Increase in confluent hypermetabolic pleural lesions LEFT hemithorax. 3. Persistent large loculated LEFT pleural effusions. 4. Persistent hypermetabolic mediastinal lymph nodes and masses 5. 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 6. Marked improvement in mass lesion previous associated with rectum. 7. Additional improvement in lymph nodes in the retroperitoneum and subcutaneous nodes.  ELROY SCHEMBRI has presented to the ED on multiple occasions for pleural effusion on 01/27/2018 and 02/03/2018.  He has had significant pain on his left flank, upper abdomen, and lower chest.   Biopsies of   Past/Anticipated interventions by medical oncology, if any:  Dr. Irene Limbo 02/06/2018 -CXR today -IR consultation for placement of left sided pleurex catheter for recurrent malignant left pleural effusion -Home care referral for pleurex catheter mx -Rad Oncology consultation for consideration of palliative left thoracic RT for refractory symptomatic mantle cell lymphoma. -Schedule for Rituxan ASAP q3 weeks   Weight changes, if any, over the past 6 months:  18 pounds lost over 1 week.  Recurrent fevers, or drenching night sweats, if any: Increased night sweats.  SAFETY ISSUES:  Prior radiation? No  Pacemaker/ICD? No  Possible current pregnancy? No  Is the patient on methotrexate? No  Current Complaints / other details:  Retinitis pigmentosa causing legal blindness- seeing a retina specialist.

## 2018-02-12 ENCOUNTER — Encounter: Payer: Self-pay | Admitting: Radiation Oncology

## 2018-02-12 ENCOUNTER — Ambulatory Visit
Admission: RE | Admit: 2018-02-12 | Discharge: 2018-02-12 | Disposition: A | Discharge status: Home / Self Care | Payer: Medicare Other | Source: Ambulatory Visit | Attending: Radiation Oncology | Admitting: Radiation Oncology

## 2018-02-12 ENCOUNTER — Encounter: Payer: Self-pay | Admitting: General Practice

## 2018-02-12 ENCOUNTER — Other Ambulatory Visit: Payer: Self-pay | Admitting: Hematology

## 2018-02-12 ENCOUNTER — Telehealth: Payer: Self-pay | Admitting: *Deleted

## 2018-02-12 ENCOUNTER — Other Ambulatory Visit: Payer: Self-pay

## 2018-02-12 VITALS — BP 115/72 | HR 113 | Temp 97.9°F | Resp 18 | Ht 69.0 in | Wt 182.2 lb

## 2018-02-12 DIAGNOSIS — C8598 Non-Hodgkin lymphoma, unspecified, lymph nodes of multiple sites: Secondary | ICD-10-CM

## 2018-02-12 DIAGNOSIS — C8318 Mantle cell lymphoma, lymph nodes of multiple sites: Secondary | ICD-10-CM

## 2018-02-12 DIAGNOSIS — R590 Localized enlarged lymph nodes: Secondary | ICD-10-CM | POA: Insufficient documentation

## 2018-02-12 DIAGNOSIS — I517 Cardiomegaly: Secondary | ICD-10-CM | POA: Insufficient documentation

## 2018-02-12 DIAGNOSIS — J90 Pleural effusion, not elsewhere classified: Secondary | ICD-10-CM | POA: Insufficient documentation

## 2018-02-12 DIAGNOSIS — Z79899 Other long term (current) drug therapy: Secondary | ICD-10-CM | POA: Diagnosis not present

## 2018-02-12 DIAGNOSIS — F17210 Nicotine dependence, cigarettes, uncomplicated: Secondary | ICD-10-CM | POA: Diagnosis not present

## 2018-02-12 DIAGNOSIS — K7689 Other specified diseases of liver: Secondary | ICD-10-CM | POA: Insufficient documentation

## 2018-02-12 MED ORDER — TRAMADOL HCL 50 MG PO TABS: 100.0000 mg | ORAL_TABLET | ORAL | 0 refills | Status: DC | PRN

## 2018-02-12 MED FILL — traMADol HCL 50 MG TABS: 50 | 12 days supply | Qty: 90 | Fill #0

## 2018-02-12 NOTE — Progress Notes (Signed)
[place] Psychosocial Distress Screening Clinical Social Work  Clinical Social Work was referred by distress screening protocol.  The patient scored a 10 on the Psychosocial Distress Thermometer which indicates severe distress. Clinical Social Worker Kristopher Johnson to assess for distress and other psychosocial needs. Unable to reach patient by phone, mailbox full and not accepting messages.  Spoke w daughter, says patient often doesn't answer phone due to pain and blindness. "sometimes he doesn't see number clearly and wont answer."  Daughter has moved here from out of state in order to get patient to appointments and provide care in the home.   Discussed options for caregiver support resources and [employer].  Per daughter, patient is having difficulty w the emotional impact of cancer diagnosis and treatment plan. Is used to being independent and active.  Packet mailed.    ONCBCN DISTRESS SCREENING 02/12/2018  Screening Type Initial Screening  Distress experienced in past week (1-10) 10  Emotional problem type Depression;Nervousness/Anxiety;Adjusting to illness;Isolation/feeling alone;Boredom  Physical Problem type Pain;Nausea/vomiting;Breathing;Loss of appetitie;Constipation/diarrhea    Clinical Social Worker follow up needed: Yes.    If yes, follow up plan:  Kristopher Shell, LCSW Clinical Social Worker Phone:  [phone]

## 2018-02-12 NOTE — Telephone Encounter (Signed)
Per Dr. Irene Limbo, pt has not been schedule for pleurex drain/thoracentesis.  Order placed 5/2.  SW central scheduling, instructed to call IR.  SW Tiffany in IR.  Per Tiffany, will change order to correct order pleurex catheter placement.  Dr. Irene Limbo to sign off on order.  Will fax additional order form that needs to be signed prior to procedure.  Fax number provided.

## 2018-02-12 NOTE — Progress Notes (Signed)
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 A diagnosis of Lymphoma of lymph nodes of multiple sites [facility]) was also pertinent to this visit.   HISTORY OF PRESENT ILLNESS: Kristopher Johnson is a 62 y.o. male seen at the request of Dr. 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 The patient has had PET imaging recently which has revealed significant disease in the spleen with the overall measurement of 10.8 x 6.8 cm, previously 5 x 3.4 cm on PET in January 2019. He has a malignant left pleural effusion that has been drained with thoracentesis 5 times in the last 5 months. He is on Ibrutinib and Venetoclax, and will receive Rituxan tomorrow. He was supposed to be scheduled for Pleurex catheter but has not had this placed, and due to his pain form his splenic disease, he's seen today for consideration of palliative radiotherapy.    PREVIOUS RADIATION THERAPY: No   PAST MEDICAL HISTORY:  Past Medical History:  Diagnosis Date  . Abscess of arm, right 10/27/2015  . Lymphoma of lymph nodes of multiple sites ([place]) 10/27/2015  . 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 Shortness of breath 10/27/2015       PAST SURGICAL HISTORY: Past Surgical History:  Procedure Laterality Date  . Cataract surgery Bilateral    Patient notes she has had bilateral Surgery in 1998 and 99  . FLEXIBLE SIGMOIDOSCOPY N/A 03/01/2017   Procedure: FLEXIBLE SIGMOIDOSCOPY;  Surgeon: Manus Gunning, MD;  Location: [facility];  Service:  Gastroenterology;  Laterality: N/A;  . TONSILLECTOMY     about 62years old     FAMILY HISTORY:  Family History  Problem Relation Age of Onset  . Colon cancer Neg Hx      SOCIAL HISTORY:  reports that he has been smoking cigarettes.  He has a 33.00 pack-year smoking history. He has never used smokeless tobacco. He reports that he does not drink alcohol or use drugs. The patient is single and lives in [place]. He is accompanied by his daughter. 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   MEDICATIONS:  Current Outpatient Medications  Medication Sig Dispense Refill  . Ibrutinib 560 MG TABS Take 560 mg by mouth daily. Take with a full glass of water at approximately the same time daily, maintain hydration. 28 tablet 3  . traMADol (ULTRAM) 50 MG tablet Take 2 tablets (100 mg total) by mouth every 4 (four) hours as needed for moderate pain or severe pain. 90 tablet 0  . Venetoclax 10 & 50 & 100 MG TBPK Take 20 mg by mouth daily for 7 days. OACZ6: Take 50mg  daily x 7 days. SAYT0: Take 100mg  daily x 7 days. ZSWF0: Take 200mg  daily x 7 days. 42 each 0  . pantoprazole (PROTONIX) 40 MG tablet Take 1 tablet (40 mg total) by mouth daily. (Patient not taking: Reported on 02/06/2018) 7 tablet 0  . senna-docusate (  SENNA S) 8.6-50 MG tablet Take 2 tablets by mouth at bedtime. 60 tablet 1   No current facility-administered medications for this encounter.      REVIEW OF SYSTEMS: On review of systems, the patient reports that he is doing poorly with his breathing. He does have left lateral chest wall pain, orthopnea, and increasing shortness of breath. He denies any cough, fevers, chills, night sweats, unintended weight changes. He denies any bowel or bladder disturbances, and denies abdominal pain, nausea or vomiting. He denies any new musculoskeletal or joint aches or pains. A complete review of systems is obtained and is otherwise  negative.    PHYSICAL EXAM:  Wt Readings from Last 3 Encounters:  02/12/18 182 lb 3.2 oz (82.6 kg)  02/06/18 182 lb 14.4 oz (83 kg)  02/03/18 189 lb (85.7 kg)   Temp Readings from Last 3 Encounters:  02/12/18 97.9 F (36.6 C)  02/06/18 97.6 F (36.4 C) (Oral)  02/04/18 98.7 F (37.1 C) (Oral)   BP Readings from Last 3 Encounters:  02/12/18 115/72  02/06/18 (!) 121/53  02/04/18 (!) 119/55   Pulse Readings from Last 3 Encounters:  02/12/18 (!) 113  02/06/18 (!) 119  02/04/18 (!) 110   Pain Assessment Pain Score: 10-Worst pain ever(Left ribs, stomach area)/10  In general this is an uncomfortable appearing African American male in no acute distress. He is alert and oriented x4 and appropriate throughout the examination. HEENT reveals that the patient is normocephalic, atraumatic. EOMs are intact. PERRLA. Skin is intact without any evidence of gross lesions. Cardiovascular exam reveals a regular rate and rhythm, no clicks rubs or murmurs are auscultated. Chest is clear to auscultation on the right, but no breath sounds are noted at or below the level of the left scapula. The abdomen is soft,  Tender to light palpation over the left upper abdominal quadrant, the abdomen is moderately distended. Lower extremities are negative for pretibial pitting edema, deep calf tenderness, cyanosis or clubbing.   ECOG = 1  0 - Asymptomatic (Fully active, able to carry on all predisease activities without restriction)  1 - Symptomatic but completely ambulatory (Restricted in physically strenuous activity but ambulatory and able to carry out work of a light or sedentary nature. For example, light housework, office work)  2 - Symptomatic, <50% in bed during the day (Ambulatory and capable of all self care but unable to carry out any work activities. Up and about more than 50% of waking hours)  3 - Symptomatic, >50% in bed, but not bedbound (Capable of only limited self-care, confined to bed or chair  50% or more of waking hours)  4 - Bedbound (Completely disabled. Cannot carry on any self-care. Totally confined to bed or chair)  5 - Death   Eustace Pen MM, Creech RH, Tormey DC, et al. [phone]). "Toxicity and response criteria of the [facility]". [place] Oncol. 5 (6): 649-55    LABORATORY DATA:  Lab Results  Component Value Date   WBC 11.9 (H) 02/06/2018   HGB 15.5 02/06/2018   HCT 48.8 02/06/2018   MCV 91.9 02/06/2018   PLT 107 (L) 02/06/2018   Lab Results  Component Value Date   NA 137 02/06/2018   K 4.5 02/06/2018   CL 99 02/06/2018   CO2 25 02/06/2018   Lab Results  Component Value Date   ALT 12 02/06/2018   AST 11 02/06/2018   ALKPHOS 94 02/06/2018   BILITOT 0.6 02/06/2018  RADIOGRAPHY: Dg Chest 1 View  Result Date: 02/04/2018 CLINICAL DATA:  Status post thoracentesis EXAM: CHEST  1 VIEW COMPARISON:  February 03, 2018 FINDINGS: No pneumothorax. There is moderate loculated effusion on the left currently with patchy consolidation in the left base. Right lung is clear. Heart size and pulmonary vascularity are normal. Port-A-Cath tip is in the superior vena cava. No adenopathy. No bone lesions. IMPRESSION: Moderate residual loculated effusion after thoracentesis. No pneumothorax. There is patchy consolidation in the left base as well. Lungs elsewhere clear. Heart size normal. Electronically Signed   By: Lowella Grip III M.D.   On: 02/04/2018 10:46   Dg Chest 1 View  Result Date: 01/29/2018 CLINICAL DATA:  Post left thoracentesis EXAM: CHEST  1 VIEW COMPARISON:  01/28/2018 FINDINGS: Small to moderate residual left effusion following thoracentesis. No pneumothorax. Port-A-Cath is in place, unchanged. Heart is upper limits normal in size. Left base atelectasis or infiltrate, improved since prior study. IMPRESSION: Improving left pleural effusion and left base atelectasis or infiltrate. No pneumothorax. Electronically Signed   By: Rolm Baptise M.D.    On: 01/29/2018 13:24   Dg Chest 1 View  Result Date: 01/27/2018 CLINICAL DATA:  62 year old male status post left thoracentesis EXAM: CHEST  1 VIEW COMPARISON:  01/27/2018 FINDINGS: Cardiomediastinal silhouette likely unchanged with the left heart border partially obscured by residual opacity at the left lung base. Compared to the prior there is improved aeration with decreased opacity on the left. No pneumothorax. Right lung relatively well aerated. Port catheter on the right chest wall, unchanged. No displaced fracture. IMPRESSION: No complicating features status post left-sided thoracentesis. Residual opacity at the left base, likely a combination of residual fluid, atelectasis/consolidation, and soft tissue in this patient with known lymphoma. Electronically Signed   By: Corrie Mckusick D.O.   On: 01/27/2018 17:42   Dg Chest 2 View  Result Date: 02/06/2018 CLINICAL DATA:  Status post left-sided thoracentesis last week. The patient is were reporting worsening left-sided chest pain and shortness of breath. EXAM: CHEST - 2 VIEW COMPARISON:  PET-CT study of Feb 05, 2018 and chest x-ray of February 04, 2018 FINDINGS: There has been reaccumulation of pleural fluid on the left. It occupies approximately 1/2 of the pleural space volume. There is no significant mediastinal shift. The right lung is clear. The heart and pulmonary vascularity are normal. The power port catheter tip projects over the midportion of the SVC. The bony thorax exhibits no acute abnormality. IMPRESSION: Interval re-accumulation of the left pleural effusion over the past 2 days. No pneumothorax or mediastinal shift. No CHF. Electronically Signed   By: David  Martinique M.D.   On: 02/06/2018 11:18   Dg Chest 2 View  Result Date: 02/03/2018 CLINICAL DATA:  Shortness of breath. EXAM: CHEST - 2 VIEW COMPARISON:  Radiograph of January 29, 2018. FINDINGS: Mild cardiomegaly is noted. Right lung is clear. Right internal jugular Port-A-Cath is noted with  distal tip in expected position of SVC. Large left pleural effusion is noted with underlying atelectasis or infiltrate. Bony thorax is unremarkable. IMPRESSION: Large left pleural effusion is noted with underlying atelectasis or infiltrate. Electronically Signed   By: Marijo Conception, M.D.   On: 02/03/2018 11:44   Dg Chest 2 View  Result Date: 01/27/2018 CLINICAL DATA:  Worsening shortness of breath for 4 days. Mantle cell lymphoma. EXAM: CHEST - 2 VIEW COMPARISON:  11/28/2017 FINDINGS: Large left pleural effusion has increased in size since previous study, with compressive atelectasis of the  left lung. Right lung remains clear. Heart size appears stable. Right-sided power port remains in appropriate position. IMPRESSION: Increased size of large left pleural effusion and compressive atelectasis. Electronically Signed   By: Earle Gell M.D.   On: 01/27/2018 12:15   Ct Angio Chest Pe W Or Wo Contrast  Result Date: 01/28/2018 CLINICAL DATA:  Five-day history of shortness of breath. History of non-Hodgkin's lymphoma. EXAM: CT ANGIOGRAPHY CHEST WITH CONTRAST TECHNIQUE: Multidetector CT imaging of the chest was performed using the standard protocol during bolus administration of intravenous contrast. Multiplanar CT image reconstructions and MIPs were obtained to evaluate the vascular anatomy. CONTRAST:  126mL ISOVUE-370 IOPAMIDOL (ISOVUE-370) INJECTION 76% COMPARISON:  PET-CT 10/25/2017 FINDINGS: Cardiovascular: The heart is normal in size. No pericardial effusion. The aorta is normal in caliber. No dissection. The branch vessels are patent. The pulmonary arterial tree is fairly well opacified. Breathing motion artifact limits examination but no definite filling defects to suggest pulmonary embolism. Mediastinum/Nodes: Much improved right axillary and subpectoral adenopathy. The mediastinal and hilar adenopathy is also much improved. 10 mm precarinal lymph node on image number 42 previously measured 16 mm. The 22  mm subcarinal nodal mass seen on the prior study has resolved. Lungs/Pleura: Large pleural lesions have significantly improved since the prior examination. The pleural lesion or internal mammary adenopathy on the left side measures 25 mm and previously measured 37 mm. The large pleural mass on the left side slightly more inferiorly has completely resolved. The right internal mammary adenopathy has largely resolved. The pleural mass at the left lung base laterally on image number 74 appears relatively stable. Persistent moderate-sized left pleural effusion with associated enhancing pleural nodularity posteriorly appears somewhat improved. Patchy airspace disease in the left upper lobe and lingula, likely pneumonia. This may account for the patient's shortness of breath. The right lung remains clear.  No right-sided pleural effusion. Upper Abdomen: Stable left hepatic lobe cyst. Stable upper abdominal lymphadenopathy. Musculoskeletal: No significant bony findings. Stable small scattered sclerotic bone lesions, likely benign bone islands. Review of the MIP images confirms the above findings. IMPRESSION: 1. No definite CT findings for pulmonary embolism. Exam is somewhat limited by breathing motion artifact. 2. Left upper lobe and lingula pneumonia. 3. Overall improved lymphadenopathy and left-sided pleural disease. Persistent left effusion. Electronically Signed   By: Marijo Sanes M.D.   On: 01/28/2018 10:33   Nm Pet Image Restag (ps) Skull Base To Thigh  Result Date: 02/05/2018 CLINICAL DATA:  Subsequent treatment strategy for mantle cell lymphoma, stage IV. EXAM: NUCLEAR MEDICINE PET SKULL BASE TO THIGH TECHNIQUE: 10.2 mCi F-18 FDG was injected intravenously. Full-ring PET imaging was performed from the skull base to thigh after the radiotracer. CT data was obtained and used for attenuation correction and anatomic localization. Fasting blood glucose: 103 mg/dl COMPARISON:  PET-CT 10/25/2017, 9 /12/18 FINDINGS:  Mediastinal blood pool activity: SUV max 2.4 NECK: No hypermetabolic lymph nodes in the neck. Incidental CT findings: none CHEST: There is decreased thickening of the masslike pleural metastasis in the LEFT hemithorax however there is increase in confluence of the pleural involvement. The near entirety of the LEFT hemithorax pleural surface is now involved by the hypermetabolic malignancy with SUV max equal 10.0. Again individual mass lesion decreased in size. For example 3 cm thick lesion in the anterior aspect of the LEFT upper lobe is decreased to 1 cm. Some lesions remain unchanged. For example mass lesion at the level of the LEFT internal mammary nodes measures 3.4 cm compared  to 3.6 cm with SUV max equal 15 not changed. Large loculated LEFT effusion remains. There are intensely hypermetabolic mediastinal lymph nodes and internal mammary lymph nodes on the RIGHT unchanged. Interval resolution of hypermetabolic lymph node in the RIGHT axilla. Resolution of the pleural metastasis in the RIGHT hemithorax. Incidental CT findings: Large loculated effusions in the LEFT hemithorax ABDOMEN/PELVIS: The dominant finding is interval increase in the size the spleen as well as new metabolic activity. Spleen measures 10.8 x 6.8 cm in axial dimension with SUV max equal 10.9 compared to 5.0 by 3.4 cm with no significant metabolic activity background on comparison exam. Conversely, the large mass lesion associated with the rectum is near completely resolved. Lymph nodes in the retroperitoneum have near completely resolved. Example lesion anterior to the RIGHT psoas and IVC previously measured 3.2 cm is no longer measurable. Additional cutaneous nodules have improved and resolved in the pelvis. Example previous nodule in the soft tissues and muscles anterior to the LEFT femoral head has resolved. Incidental CT findings: none SKELETON: No focal hypermetabolic activity to suggest skeletal metastasis. Incidental CT findings: none  IMPRESSION: 1. Mixed response to therapy. 2. Decrease in size pleural metastasis in the RIGHT and LEFT lungs. Increase in confluent hypermetabolic pleural lesions LEFT hemithorax. 3. Persistent large loculated LEFT pleural effusions. 4. Persistent hypermetabolic mediastinal lymph nodes and masses 5. 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 6. Marked improvement in mass lesion previous associated with rectum. 7. Additional improvement in lymph nodes in the retroperitoneum and subcutaneous nodes. Electronically Signed   By: Suzy Bouchard M.D.   On: 02/05/2018 17:36   Dg Chest Port 1 View  Result Date: 01/28/2018 CLINICAL DATA:  Status post left-sided thoracentesis yesterday. History of lymphoma. EXAM: PORTABLE CHEST 1 VIEW COMPARISON:  Postprocedure chest x-ray of January 27, 2018 FINDINGS: The right lung is well-expanded and clear. On the left there is increased density in the mid to lower lung. The left pleural effusion is smaller but atelectasis or infiltrate is more apparent in the lower 1/2 of the left hemithorax today. The heart is not enlarged. The pulmonary vascularity remains prominent centrally. The power port catheter tip projects over the midportion of the SVC. IMPRESSION: Interval increase conspicuity of atelectasis or pneumonia at the left lung base. Persistent small left pleural effusion. Electronically Signed   By: David  Martinique M.D.   On: 01/28/2018 12:02   US Thoracentesis Asp Pleural Space W/img Guide  Result Date: 02/04/2018 INDICATION: Patient with history of mantle cell lymphoma and recurrent symptomatic malignant left pleural effusion. Request made for therapeutic left thoracentesis. EXAM: ULTRASOUND GUIDED THERAPEUTIC LEFT THORACENTESIS MEDICATIONS: None COMPLICATIONS: None immediate. PROCEDURE: An ultrasound guided thoracentesis was thoroughly discussed with the patient and questions answered. The benefits, risks,  alternatives and complications were also discussed. The patient understands and wishes to proceed with the procedure. Written consent was obtained. Ultrasound was performed to localize and mark an adequate pocket of fluid in the left chest. The area was then prepped and draped in the normal sterile fashion. 1% Lidocaine was used for local anesthesia. Under ultrasound guidance a 6 Fr Safe-T-Centesis catheter was introduced. Thoracentesis was performed. The catheter was removed and a dressing applied. FINDINGS: A total of approximately 2.2 liters of blood-tinged fluid was removed. Due to patient coughing only the above amount of fluid was removed today. IMPRESSION: Successful ultrasound guided therapeutic left thoracentesis yielding 2.2 liters of pleural fluid. Follow-up chest x-ray revealed no  pneumothorax. Read by: Rowe Alanson, PA-C Electronically Signed   By: Markus Daft M.D.   On: 02/04/2018 11:34   US Thoracentesis Asp Pleural Space W/img Guide  Result Date: 01/29/2018 INDICATION: Mantle cell lymphoma. Recurrent malignant pleural effusion. Request for therapeutic thoracentesis. EXAM: ULTRASOUND GUIDED LEFT THORACENTESIS MEDICATIONS: 1% Lidocaine = 10 mL COMPLICATIONS: None immediate. PROCEDURE: An ultrasound guided thoracentesis was thoroughly discussed with the patient and questions answered. The benefits, risks, alternatives and complications were also discussed. The patient understands and wishes to proceed with the procedure. Written consent was obtained. Ultrasound was performed to localize and mark an adequate pocket of fluid in the left chest. The area was then prepped and draped in the normal sterile fashion. 1% Lidocaine was used for local anesthesia. Under ultrasound guidance a 6 Fr Safe-T-Centesis catheter was introduced. Thoracentesis was performed. The catheter was removed and a dressing applied. FINDINGS: A total of approximately 2.2 liters of clear amber fluid was removed. IMPRESSION:  Successful ultrasound guided left thoracentesis yielding 2.2 liters of pleural fluid. No pneumothorax on post procedure chest X-ray. Read by: Gareth Eagle, PA-C Electronically Signed   By: Lucrezia Europe M.D.   On: 01/29/2018 14:05   US Thoracentesis Asp Pleural Space W/img Guide  Result Date: 01/27/2018 INDICATION: Patient with history of mantle cell lymphoma with recurrent malignant left pleural effusion, dyspnea. Request made for diagnostic and therapeutic left thoracentesis. EXAM: ULTRASOUND GUIDED DIAGNOSTIC AND THERAPEUTIC LEFT THORACENTESIS MEDICATIONS: None COMPLICATIONS: None immediate. PROCEDURE: An ultrasound guided thoracentesis was thoroughly discussed with the patient and questions answered. The benefits, risks, alternatives and complications were also discussed. The patient understands and wishes to proceed with the procedure. Written consent was obtained. Ultrasound was performed to localize and mark an adequate pocket of fluid in the left chest. The area was then prepped and draped in the normal sterile fashion. 1% Lidocaine was used for local anesthesia. Under ultrasound guidance a 6 Fr Safe-T-Centesis catheter was introduced. Thoracentesis was performed. The catheter was removed and a dressing applied. FINDINGS: A total of approximately 2.8 liters of blood-tinged fluid was removed. Samples were sent to the laboratory as requested by the clinical team. IMPRESSION: Successful ultrasound guided diagnostic and therapeutic left thoracentesis yielding 2.8 liters of pleural fluid. Read by: Rowe Revel, PA-C Electronically Signed   By: Corrie Mckusick D.O.   On: 01/27/2018 17:16       IMPRESSION/PLAN: 1. Pleomorphic Mantle Cell Lymphoma involving the spleen and left pleural space. Dr. Lisbeth Renshaw discusses the pathology findings and reviews the nature of lymphomatous disease and the rationale for palliative radiotherapy. We discussed the risks, benefits, short, and long term effects of radiotherapy, and the  patient is interested in proceeding. Dr. Lisbeth Renshaw discusses the delivery and logistics of radiotherapy and anticipates a course of 2 weeks of radiotherapy. Written consent is obtained and placed in the chart, a copy was provided to the patient. We will plan simulation once he's had pleurex catheter placement so he can lay flat to simulate.  2. Left pleural effusion secondary to #1. Dr. Irene Limbo is in the process of trying to get the patient set up for pleurex catheter placement. The patient is unable to lay flat until this has been performed and we will plan to proceed with simulation and subsequent radiation once that's been performed.  3. Pain secondary to #1. A new prescription is sent in for tramadol as he prefers this to oxycodone.   In a visit lasting 60 minutes, greater than 50% of the time  was spent face to face discussing his case, and coordinating the patient's care.   The above documentation reflects my direct findings during this shared patient visit. Please see the separate note by Dr. Lisbeth Renshaw on this date for the remainder of the patient's plan of care.    Carola Rhine, PAC

## 2018-02-13 ENCOUNTER — Other Ambulatory Visit: Payer: Self-pay | Admitting: Hematology

## 2018-02-13 ENCOUNTER — Inpatient Hospital Stay: Payer: Medicare Other

## 2018-02-13 ENCOUNTER — Inpatient Hospital Stay (HOSPITAL_BASED_OUTPATIENT_CLINIC_OR_DEPARTMENT_OTHER): Payer: Medicare Other | Admitting: Medical

## 2018-02-13 ENCOUNTER — Telehealth: Payer: Self-pay | Admitting: Pharmacist

## 2018-02-13 VITALS — BP 108/71 | HR 82 | Temp 98.4°F | Resp 16

## 2018-02-13 DIAGNOSIS — F17210 Nicotine dependence, cigarettes, uncomplicated: Secondary | ICD-10-CM

## 2018-02-13 DIAGNOSIS — J910 Malignant pleural effusion: Secondary | ICD-10-CM

## 2018-02-13 DIAGNOSIS — Z79899 Other long term (current) drug therapy: Secondary | ICD-10-CM

## 2018-02-13 DIAGNOSIS — Z5112 Encounter for antineoplastic immunotherapy: Secondary | ICD-10-CM | POA: Diagnosis not present

## 2018-02-13 DIAGNOSIS — C8318 Mantle cell lymphoma, lymph nodes of multiple sites: Secondary | ICD-10-CM

## 2018-02-13 DIAGNOSIS — J90 Pleural effusion, not elsewhere classified: Secondary | ICD-10-CM

## 2018-02-13 DIAGNOSIS — Z7189 Other specified counseling: Secondary | ICD-10-CM

## 2018-02-13 DIAGNOSIS — H3552 Pigmentary retinal dystrophy: Secondary | ICD-10-CM

## 2018-02-13 DIAGNOSIS — H548 Legal blindness, as defined in USA: Secondary | ICD-10-CM

## 2018-02-13 DIAGNOSIS — E43 Unspecified severe protein-calorie malnutrition: Secondary | ICD-10-CM

## 2018-02-13 DIAGNOSIS — C8598 Non-Hodgkin lymphoma, unspecified, lymph nodes of multiple sites: Secondary | ICD-10-CM

## 2018-02-13 LAB — COMPREHENSIVE METABOLIC PANEL
ALK PHOS: 85 U/L (ref 40–150)
ALT: 21 U/L (ref 0–55)
AST: 12 U/L (ref 5–34)
Albumin: 2.7 g/dL — ABNORMAL LOW (ref 3.5–5.0)
Anion gap: 7 (ref 3–11)
BILIRUBIN TOTAL: 0.5 mg/dL (ref 0.2–1.2)
BUN: 15 mg/dL (ref 7–26)
CALCIUM: 9 mg/dL (ref 8.4–10.4)
CO2: 29 mmol/L (ref 22–29)
CREATININE: 0.99 mg/dL (ref 0.70–1.30)
Chloride: 103 mmol/L (ref 98–109)
GFR calc non Af Amer: >60 mL/min (ref >60)
GLUCOSE: 147 mg/dL — AB (ref 70–140)
Potassium: 4.3 mmol/L (ref 3.5–5.1)
SODIUM: 139 mmol/L (ref 136–145)
TOTAL PROTEIN: 5.5 g/dL — AB (ref 6.4–8.3)

## 2018-02-13 LAB — CBC WITH DIFFERENTIAL/PLATELET
BASOS PCT: 0 %
Basophils Absolute: 0 10*3/uL (ref 0.0–0.1)
EOS ABS: 0.2 10*3/uL (ref 0.0–0.5)
EOS PCT: 1 %
HCT: 44.8 % (ref 38.4–49.9)
Hemoglobin: 14.2 g/dL (ref 13.0–17.1)
LYMPHS ABS: 4.8 10*3/uL — AB (ref 0.9–3.3)
Lymphocytes Relative: 37 %
MCH: 28.5 pg (ref 27.2–33.4)
MCHC: 31.8 g/dL — ABNORMAL LOW (ref 32.0–36.0)
MCV: 89.4 fL (ref 79.3–98.0)
MONOS PCT: 3 %
Monocytes Absolute: 0.4 10*3/uL (ref 0.1–0.9)
Neutro Abs: 7.8 10*3/uL — ABNORMAL HIGH (ref 1.5–6.5)
Neutrophils Relative %: 59 %
Platelets: 141 10*3/uL (ref 140–400)
RBC: 5.01 MIL/uL (ref 4.20–5.82)
RDW: 15.8 % — AB (ref 11.0–14.6)
WBC: 13.1 10*3/uL — ABNORMAL HIGH (ref 4.0–10.3)

## 2018-02-13 MED ORDER — OXYCODONE-ACETAMINOPHEN 5-325 MG PO TABS
ORAL_TABLET | ORAL | Status: AC
  Filled 2018-02-13: qty 2

## 2018-02-13 MED ORDER — ACETAMINOPHEN 325 MG PO TABS
650.0000 mg | ORAL_TABLET | Freq: Once | ORAL | Status: AC
  Administered 2018-02-13: 650 mg via ORAL

## 2018-02-13 MED ORDER — METHYLPREDNISOLONE SODIUM SUCC 125 MG IJ SOLR
125.0000 mg | Freq: Every day | INTRAMUSCULAR | Status: AC
  Administered 2018-02-13: 125 mg via INTRAVENOUS

## 2018-02-13 MED ORDER — SODIUM CHLORIDE 0.9 % IV SOLN
375.0000 mg/m2 | Freq: Once | INTRAVENOUS | Status: AC
  Administered 2018-02-13: 800 mg via INTRAVENOUS
  Filled 2018-02-13: qty 50

## 2018-02-13 MED ORDER — FAMOTIDINE IN NACL 20-0.9 MG/50ML-% IV SOLN
20.0000 mg | Freq: Once | INTRAVENOUS | Status: AC | PRN
Start: 2018-02-13 — End: 2018-02-13
  Administered 2018-02-13: 20 mg via INTRAVENOUS

## 2018-02-13 MED ORDER — ALLOPURINOL 300 MG PO TABS: 300.0000 mg | ORAL_TABLET | Freq: Every day | ORAL | 0 refills | Status: DC

## 2018-02-13 MED ORDER — ACETAMINOPHEN 325 MG PO TABS
ORAL_TABLET | ORAL | Status: AC
  Filled 2018-02-13: qty 2

## 2018-02-13 MED ORDER — OXYCODONE-ACETAMINOPHEN 5-325 MG PO TABS
2.0000 | ORAL_TABLET | ORAL | Status: AC
  Administered 2018-02-13: 2 via ORAL

## 2018-02-13 MED ORDER — METHYLPREDNISOLONE SODIUM SUCC 125 MG IJ SOLR
INTRAMUSCULAR | Status: AC
  Filled 2018-02-13: qty 2

## 2018-02-13 MED ORDER — SODIUM CHLORIDE 0.9% FLUSH
10.0000 mL | INTRAVENOUS | Status: DC | PRN
  Administered 2018-02-13: 10 mL
  Filled 2018-02-13: qty 10

## 2018-02-13 MED ORDER — SODIUM CHLORIDE 0.9 % IV SOLN
Freq: Once | INTRAVENOUS | Status: AC
  Administered 2018-02-13: 09:00:00 via INTRAVENOUS

## 2018-02-13 MED ORDER — DIPHENHYDRAMINE HCL 25 MG PO CAPS
50.0000 mg | ORAL_CAPSULE | Freq: Once | ORAL | Status: AC
  Administered 2018-02-13: 50 mg via ORAL

## 2018-02-13 MED ORDER — DIPHENHYDRAMINE HCL 25 MG PO CAPS
ORAL_CAPSULE | ORAL | Status: AC
  Filled 2018-02-13: qty 2

## 2018-02-13 MED ORDER — SODIUM CHLORIDE 0.9 % IV SOLN
Freq: Once | INTRAVENOUS | Status: AC | PRN
  Administered 2018-02-13: 12:00:00 via INTRAVENOUS

## 2018-02-13 MED ORDER — DIPHENHYDRAMINE HCL 50 MG/ML IJ SOLN
25.0000 mg | Freq: Once | INTRAMUSCULAR | Status: AC | PRN
  Administered 2018-02-13: 25 mg via INTRAVENOUS

## 2018-02-13 MED ORDER — HEPARIN SOD (PORK) LOCK FLUSH 100 UNIT/ML IV SOLN
500.0000 [IU] | Freq: Once | INTRAVENOUS | Status: AC | PRN
  Administered 2018-02-13: 500 [IU]
  Filled 2018-02-13: qty 5

## 2018-02-13 MED FILL — ALLOPURINOL 300 MG TABS: 300 | 30 days supply | Qty: 30 | Fill #0

## 2018-02-13 MED FILL — VENCLEXTA STARTING PACK: 10 & 50 & 1 | 28 days supply | Qty: 42 | Fill #0

## 2018-02-13 NOTE — Telephone Encounter (Signed)
Oral Chemotherapy Pharmacist Encounter  I spoke with patient in infusion for overview of: Venclexta (venetoclax) for the treatment of relapsed mantle cell lymphoma in conjunction with Imbruvica (ibrutinib), planned duration until disease progression or unacceptable toxicity. Patient previously started on Imbruvica 560 mg once daily.  Imbruvica start date: 10/29/2017  Venclexta dose will be titrated up per manufacturer recommendations for CLL initiation as was done in relapsed/refractory mantle cell lymphoma trial (Tam CS, et al. Alene Mires 2018;378(13):1211-1223).  Counseled patient on administration, dosing, side effects, monitoring, drug-food interactions, safe handling, storage, and disposal.  Patient will take Venclexta 20mg  tablets, 1 tablet by mouth once daily with food and water for 7 days. Venclexta will be initiated outpatient based on risk of TLS and discussion with MD.  Baseline labs will be assessed on 02/17/18 and the 1st dose will be administered. CBC, uric acid, and electrolytes will be monitored at 4-6 hours post dose administration per protocol. Hydration will be administered IV. Patient will return 24 hours later for repeat laboratory assessment.  Venclexta start date: 02/17/18  Patient was instructed to start allopurinol 300mg  every 24 hours, starting 3 days prior to Venclexta initiation.  Patient was instructed to increase fluid intake to 1.5 - 2L of water per day starting 2 days prior to [facility] initiation.  Patient will take Venclexta 50mg  tablets, 1 tablet by mouth once daily with food and water for 7 days. This will occur on 02/24/18 in the office with baseline labs drawn, dose administered, fluids administered and then labs checked 4-6 hours post dose. Patient will return 24 hours later for repeat laboratory assessment.  Subsequent ramp-up doses of 100mg  daily x 7 days, 200mg  daily x 7 days, and 400mg  daily (target maintenance dose) will be monitored per  protocol.  Side effects include but not limited to: TLS, decreased blood counts, electrolyte abnormalities, diarrhea, nausea, fatigue, arthralgias/myalgias, and upper respiratory tract infection.    Patient has anti-emetic on hand and knows to take it if nausea develops.   Patient will obtain anti diarrheal and alert the office of 4 or more loose stools above baseline.  Reviewed with patient importance of keeping a medication schedule and plan for any missed doses.  Mr. Okane voiced understanding and appreciation.   All questions answered. Medication reconciliation performed and medication/allergy list updated.  Venclexta will be ready for pickup at the [facility] outpatient pharmacy on 02/14/18 after 2pm for copayment of $0 I will pick up patient's first fill of Venclexta from the pharmacy on Monday morning and patient will be administered first dose after baseline labs have been drawn. Patient will be sent home with remaining month supply.  Patient knows to call the office with questions or concerns. Oral Oncology Clinic will continue to follow.  Thank you,  Johny Drilling, PharmD, BCPS, BCOP  02/13/2018   2:23 PM Oral Oncology Clinic [phone]

## 2018-02-13 NOTE — Patient Instructions (Signed)
[facility] Discharge Instructions for Patients Receiving Chemotherapy  Today you received the following chemotherapy agent: Rituxan  To help prevent nausea and vomiting after your treatment, we encourage you to take your nausea medication as prescribed.   If you develop nausea and vomiting that is not controlled by your nausea medication, call the clinic.   BELOW ARE SYMPTOMS THAT SHOULD BE REPORTED IMMEDIATELY:  *FEVER GREATER THAN 100.5 F  *CHILLS WITH OR WITHOUT FEVER  NAUSEA AND VOMITING THAT IS NOT CONTROLLED WITH YOUR NAUSEA MEDICATION  *UNUSUAL SHORTNESS OF BREATH  *UNUSUAL BRUISING OR BLEEDING  TENDERNESS IN MOUTH AND THROAT WITH OR WITHOUT PRESENCE OF ULCERS  *URINARY PROBLEMS  *BOWEL PROBLEMS  UNUSUAL RASH Items with * indicate a potential emergency and should be followed up as soon as possible.  Feel free to call the clinic should you have any questions or concerns. The clinic phone number is [phone].  Please show the CHEMO ALERT CARD at check-in to the Emergency Department and triage nurse.    Rituximab injection What is this medicine? RITUXIMAB (ri TUX i mab) is a monoclonal antibody. It is used to treat certain types of cancer like non-Hodgkin lymphoma and chronic lymphocytic leukemia. It is also used to treat rheumatoid arthritis, granulomatosis with polyangiitis (or Wegener's granulomatosis), and microscopic polyangiitis. This medicine may be used for other purposes; ask your health care provider or pharmacist if you have questions. COMMON BRAND NAME(S): Rituxan What should I tell my health care provider before I take this medicine? They need to know if you have any of these conditions: -heart disease -infection (especially a virus infection such as hepatitis B, chickenpox, cold sores, or herpes) -immune system problems -irregular heartbeat -kidney disease -lung or breathing disease, like asthma -recently received or scheduled to  receive a vaccine -an unusual or allergic reaction to rituximab, mouse proteins, other medicines, foods, dyes, or preservatives -pregnant or trying to get pregnant -breast-feeding How should I use this medicine? This medicine is for infusion into a vein. It is administered in a hospital or clinic by a specially trained health care professional. A special MedGuide will be given to you by the pharmacist with each prescription and refill. Be sure to read this information carefully each time. Talk to your pediatrician regarding the use of this medicine in children. This medicine is not approved for use in children. Overdosage: If you think you have taken too much of this medicine contact a poison control center or emergency room at once. NOTE: This medicine is only for you. Do not share this medicine with others. What if I miss a dose? It is important not to miss a dose. Call your doctor or health care professional if you are unable to keep an appointment. What may interact with this medicine? -cisplatin -other medicines for arthritis like disease modifying antirheumatic drugs or tumor necrosis factor inhibitors -live virus vaccines This list may not describe all possible interactions. Give your health care provider a list of all the medicines, herbs, non-prescription drugs, or dietary supplements you use. Also tell them if you smoke, drink alcohol, or use illegal drugs. Some items may interact with your medicine. What should I watch for while using this medicine? Your condition will be monitored carefully while you are receiving this medicine. You may need blood work done while you are taking this medicine. This medicine can cause serious allergic reactions. To reduce your risk you may need to take medicine before treatment with this medicine. Take   your medicine as directed. In some patients, this medicine may cause a serious brain infection that may cause death. If you have any problems seeing,  thinking, speaking, walking, or standing, tell your doctor right away. If you cannot reach your doctor, urgently seek other source of medical care. Call your doctor or health care professional for advice if you get a fever, chills or sore throat, or other symptoms of a cold or flu. Do not treat yourself. This drug decreases your body's ability to fight infections. Try to avoid being around people who are sick. Do not become pregnant while taking this medicine or for 12 months after stopping it. Women should inform their doctor if they wish to become pregnant or think they might be pregnant. There is a potential for serious side effects to an unborn child. Talk to your health care professional or pharmacist for more information. What side effects may I notice from receiving this medicine? Side effects that you should report to your doctor or health care professional as soon as possible: -breathing problems -chest pain -dizziness or feeling faint -fast, irregular heartbeat -low blood counts - this medicine may decrease the number of white blood cells, red blood cells and platelets. You may be at increased risk for infections and bleeding. -mouth sores -redness, blistering, peeling or loosening of the skin, including inside the mouth (this can be added for any serious or exfoliative rash that could lead to hospitalization) -signs of infection - fever or chills, cough, sore throat, pain or difficulty passing urine -signs and symptoms of kidney injury like trouble passing urine or change in the amount of urine -signs and symptoms of liver injury like dark yellow or brown urine; general ill feeling or flu-like symptoms; light-colored stools; loss of appetite; nausea; right upper belly pain; unusually weak or tired; yellowing of the eyes or skin -stomach pain -vomiting Side effects that usually do not require medical attention (report to your doctor or health care professional if they continue or are  bothersome): -headache -joint pain -muscle cramps or muscle pain This list may not describe all possible side effects. Call your doctor for medical advice about side effects. You may report side effects to FDA at 1-800-FDA-1088. Where should I keep my medicine? This drug is given in a hospital or clinic and will not be stored at home. NOTE: This sheet is a summary. It may not cover all possible information. If you have questions about this medicine, talk to your doctor, pharmacist, or health care provider.  2018 Elsevier/Gold Standard (2016-05-02 15:28:09)  

## 2018-02-13 NOTE — Telephone Encounter (Signed)
Oral Oncology Patient Advocate Encounter  Prior Authorization for Kristopher Johnson has been approved.    PA# [id number] Effective dates: 02/06/2018 until further notice  Oral Oncology Clinic will continue to follow.   [name]. [name], [place] Patient [place] [phone] 02/13/2018 9:11 AM

## 2018-02-13 NOTE — Progress Notes (Signed)
At 1129 pt reports 'feeling different' and tightness in chest/SOB.  PA Lucianne Lei called and rituxan infusion paused.  NS hung, pepcid & benadryl admin per verbal orders PA Lucianne Lei.  Placed on 4L [place] oxygen for O2 sats 87-89%, raised to 93%.  Pt reports improvement of SOB/tightness.  Titrated oxygen to 2L [place], oxygen sat 98%.  Pt reports total relief.  Per PA Lucianne Lei since pt was only on second titration of rituxan will restart at same rate.  Pt remains on oxygen 2L [place].  Pt sleeping.  VS stable.  Rituxan restarted at 1151.

## 2018-02-13 NOTE — Progress Notes (Signed)
[facility] Progress Note   Kristopher Johnson [id number] [birth year] 62 y.o.  Kristopher Johnson is managed by Dr. 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  I was asked to see the patient during his treatment after he reported chest tightness and shortness of breath.  He was given an additional dose of Benadryl 25 mg IV x1 and Pepcid 20 mg IV x1 in addition to the Tylenol 650 mg p.o., Benadryl 50 mg IV, Solu-Medrol 125 mg IV and 2 Percocet 5/325.  Large left pleural effusion: The patient is pending a thoracentesis tomorrow.  He was instructed to present to [facility] at 1115.  Please see After Visit Summary for patient specific instructions.  Future Appointments  Date Time Provider [place]  02/14/2018 11:30 AM WL-US 2 WL-US [place]  02/18/2018 11:00 AM Gildardo Cranker, DO [facility] None  02/20/2018 12:30 PM [facility]-MD[facility] ROOM [facility]-MD[facility] None  02/20/2018  2:30 PM WL-IR 1 WL-IR [place]  02/21/2018 10:30 AM [facility] LAB 3 [facility] None  02/21/2018 11:00 AM Brunetta Genera, MD [facility] None  03/06/2018  8:30 AM [facility] LAB 2 [facility] None  03/06/2018  9:00 AM Brunetta Genera, MD [facility] None  03/06/2018 10:00 AM [facility] PROCEDURE 2 [facility] None    No orders of the defined types were placed in this encounter.      Subjective:   Patient ID:  Kristopher Johnson is a 62 y.o. (DOB [birth year]) male.  Chief Complaint: No chief complaint on file.   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  He was seen in the infusion room today while receiving a restart of Rituxan.  The patient reported chest tightness and shortness of breath.  His vital signs showed that his oxygen  saturation was 87% on room air.  He was begun on 4 L via nasal cannula with the patient also given Benadryl 25 mg IV and Pepcid 20 mg IV x1 in addition to his prior premeds of Tylenol, Benadryl 50 mg IV, Solu-Medrol 125 mg IV and 2 Percocet tablets.  His symptoms abated with his oxygen saturation rising to 100%.  His chest tightness resolved.  He has a history of a pleural effusion and is scheduled to have a left thoracentesis tomorrow.  He is to be at the hospital but 1115.  Medications: I have reviewed the patient's current medications.  Allergies: No Known Allergies  Past Medical History:  Diagnosis Date  . Abscess of arm, right 10/27/2015  . 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 Shortness of breath 10/27/2015    Past Surgical History:  Procedure Laterality Date  . Cataract surgery Bilateral    Patient notes she has had bilateral Surgery in 1998 and 99  . FLEXIBLE SIGMOIDOSCOPY N/A 03/01/2017   Procedure: FLEXIBLE SIGMOIDOSCOPY;  Surgeon: Manus Gunning, MD;  Location: [facility];  Service: Gastroenterology;  Laterality: N/A;  . TONSILLECTOMY     about 62years old    Family History  Problem Relation Age of Onset  . Colon cancer Neg Hx  Social History   Socioeconomic History  . Marital status: Widowed    Spouse name: Not on file  . Number of children: Not on file  . Years of education: Not on file  . Highest education level: Not on file  Occupational History  . Not on file  Social Needs  . Financial resource strain: Not on file  . Food insecurity:    Worry: Not on file    Inability: Not on file  . Transportation needs:    Medical: Not on file    Non-medical: Not on file  Tobacco Use  . Smoking status: Current Every Day Smoker    Packs/day: 1.00    Years: 33.00    Pack years: 33.00    Types: Cigarettes    . Smokeless tobacco: Never Used  Substance and Sexual Activity  . Alcohol use: No  . Drug use: No  . Sexual activity: Not on file    Comment: Disabled, retinis pigmentosa legally blind, 4children MPOA Lynnn(cousin)  Lifestyle  . Physical activity:    Days per week: Not on file    Minutes per session: Not on file  . Stress: Not on file  Relationships  . Social connections:    Talks on phone: Not on file    Gets together: Not on file    Attends religious service: Not on file    Active member of club or organization: Not on file    Attends meetings of clubs or organizations: Not on file    Relationship status: Not on file  . Intimate partner violence:    Fear of current or ex partner: Not on file    Emotionally abused: Not on file    Physically abused: Not on file    Forced sexual activity: Not on file  Other Topics Concern  . Not on file  Social History Narrative  . Not on file    Past Medical History, Surgical history, Social history, and Family history were reviewed and updated as appropriate.   Please see review of systems for further details on the patient's review from today.   Review of Systems:  Review of Systems  Constitutional: Negative for chills and diaphoresis.  HENT: Negative for congestion, facial swelling and trouble swallowing.   Respiratory: Positive for chest tightness and shortness of breath. Negative for cough, choking and wheezing.   Cardiovascular: Negative for chest pain and palpitations.  Gastrointestinal: Negative for nausea and vomiting.  Musculoskeletal: Negative for back pain.  Skin: Negative for rash.  Neurological: Negative for dizziness, speech difficulty and headaches.  Psychiatric/Behavioral: The patient is not nervous/anxious.     Objective:   Physical Exam:  There were no vitals taken for this visit. ECOG: 0  Physical Exam  Constitutional: No distress.  HENT:  Head: Normocephalic and atraumatic.  Cardiovascular: Normal rate,  regular rhythm and normal heart sounds. Exam reveals no gallop and no friction rub.  No murmur heard. Pulmonary/Chest: No respiratory distress. He has no wheezes. He has no rales.      Neurological: He is alert.  Skin: Skin is warm and dry. No rash noted. He is not diaphoretic. No erythema.    Lab Review:     Component Value Date/Time   NA 139 02/13/2018 0841   NA 138 09/17/2017 1313   K 4.3 02/13/2018 0841   K 3.8 09/17/2017 1313   CL 103 02/13/2018 0841   CO2 29 02/13/2018 0841   CO2 26 09/17/2017 1313   GLUCOSE 147 (H)  02/13/2018 0841   GLUCOSE 323 (H) 09/17/2017 1313   BUN 15 02/13/2018 0841   BUN 11.2 09/17/2017 1313   CREATININE 0.99 02/13/2018 0841   CREATININE 1.13 02/06/2018 0857   CREATININE 1.1 09/17/2017 1313   CALCIUM 9.0 02/13/2018 0841   CALCIUM 9.5 09/17/2017 1313   PROT 5.5 (L) 02/13/2018 0841   PROT 6.7 09/17/2017 1313   ALBUMIN 2.7 (L) 02/13/2018 0841   ALBUMIN 3.6 09/17/2017 1313   AST 12 02/13/2018 0841   AST 11 02/06/2018 0857   AST 10 09/17/2017 1313   ALT 21 02/13/2018 0841   ALT 12 02/06/2018 0857   ALT 11 09/17/2017 1313   ALKPHOS 85 02/13/2018 0841   ALKPHOS 87 09/17/2017 1313   BILITOT 0.5 02/13/2018 0841   BILITOT 0.6 02/06/2018 0857   BILITOT 0.33 09/17/2017 1313   GFRNONAA >60 02/13/2018 0841   GFRNONAA >60 02/06/2018 0857   GFRAA >60 02/13/2018 0841   GFRAA >60 02/06/2018 0857       Component Value Date/Time   WBC 13.1 (H) 02/13/2018 0841   RBC 5.01 02/13/2018 0841   HGB 14.2 02/13/2018 0841   HGB 15.5 02/06/2018 0857   HGB 13.8 09/17/2017 1313   HCT 44.8 02/13/2018 0841   HCT 43.2 09/17/2017 1313   PLT 141 02/13/2018 0841   PLT 107 (L) 02/06/2018 0857   PLT 202 09/17/2017 1313   MCV 89.4 02/13/2018 0841   MCV 96.4 09/17/2017 1313   MCH 28.5 02/13/2018 0841   MCHC 31.8 (L) 02/13/2018 0841   RDW 15.8 (H) 02/13/2018 0841   RDW 14.3 09/17/2017 1313   LYMPHSABS 4.8 (H) 02/13/2018 0841   LYMPHSABS 0.9 09/17/2017 1313    MONOABS 0.4 02/13/2018 0841   MONOABS 0.3 09/17/2017 1313   EOSABS 0.2 02/13/2018 0841   EOSABS 0.2 09/17/2017 1313   BASOSABS 0.0 02/13/2018 0841   BASOSABS 0.0 09/17/2017 1313   -------------------------------  Imaging from last 24 hours (if applicable):  Radiology interpretation: Dg Chest 1 View  Result Date: 02/04/2018 CLINICAL DATA:  Status post thoracentesis EXAM: CHEST  1 VIEW COMPARISON:  February 03, 2018 FINDINGS: No pneumothorax. There is moderate loculated effusion on the left currently with patchy consolidation in the left base. Right lung is clear. Heart size and pulmonary vascularity are normal. Port-A-Cath tip is in the superior vena cava. No adenopathy. No bone lesions. IMPRESSION: Moderate residual loculated effusion after thoracentesis. No pneumothorax. There is patchy consolidation in the left base as well. Lungs elsewhere clear. Heart size normal. Electronically Signed   By: Lowella Grip III M.D.   On: 02/04/2018 10:46   Dg Chest 1 View  Result Date: 01/29/2018 CLINICAL DATA:  Post left thoracentesis EXAM: CHEST  1 VIEW COMPARISON:  01/28/2018 FINDINGS: Small to moderate residual left effusion following thoracentesis. No pneumothorax. Port-A-Cath is in place, unchanged. Heart is upper limits normal in size. Left base atelectasis or infiltrate, improved since prior study. IMPRESSION: Improving left pleural effusion and left base atelectasis or infiltrate. No pneumothorax. Electronically Signed   By: Rolm Baptise M.D.   On: 01/29/2018 13:24   Dg Chest 1 View  Result Date: 01/27/2018 CLINICAL DATA:  62 year old male status post left thoracentesis EXAM: CHEST  1 VIEW COMPARISON:  01/27/2018 FINDINGS: Cardiomediastinal silhouette likely unchanged with the left heart border partially obscured by residual opacity at the left lung base. Compared to the prior there is improved aeration with decreased opacity on the left. No pneumothorax. Right lung relatively well aerated. Port  catheter on the right chest wall, unchanged. No displaced fracture. IMPRESSION: No complicating features status post left-sided thoracentesis. Residual opacity at the left base, likely a combination of residual fluid, atelectasis/consolidation, and soft tissue in this patient with known lymphoma. Electronically Signed   By: Corrie Mckusick D.O.   On: 01/27/2018 17:42   Dg Chest 2 View  Result Date: 02/06/2018 CLINICAL DATA:  Status post left-sided thoracentesis last week. The patient is were reporting worsening left-sided chest pain and shortness of breath. EXAM: CHEST - 2 VIEW COMPARISON:  PET-CT study of Feb 05, 2018 and chest x-ray of February 04, 2018 FINDINGS: There has been reaccumulation of pleural fluid on the left. It occupies approximately 1/2 of the pleural space volume. There is no significant mediastinal shift. The right lung is clear. The heart and pulmonary vascularity are normal. The power port catheter tip projects over the midportion of the SVC. The bony thorax exhibits no acute abnormality. IMPRESSION: Interval re-accumulation of the left pleural effusion over the past 2 days. No pneumothorax or mediastinal shift. No CHF. Electronically Signed   By: David  Martinique M.D.   On: 02/06/2018 11:18   Dg Chest 2 View  Result Date: 02/03/2018 CLINICAL DATA:  Shortness of breath. EXAM: CHEST - 2 VIEW COMPARISON:  Radiograph of January 29, 2018. FINDINGS: Mild cardiomegaly is noted. Right lung is clear. Right internal jugular Port-A-Cath is noted with distal tip in expected position of SVC. Large left pleural effusion is noted with underlying atelectasis or infiltrate. Bony thorax is unremarkable. IMPRESSION: Large left pleural effusion is noted with underlying atelectasis or infiltrate. Electronically Signed   By: Marijo Conception, M.D.   On: 02/03/2018 11:44   Dg Chest 2 View  Result Date: 01/27/2018 CLINICAL DATA:  Worsening shortness of breath for 4 days. Mantle cell lymphoma. EXAM: CHEST - 2 VIEW  COMPARISON:  11/28/2017 FINDINGS: Large left pleural effusion has increased in size since previous study, with compressive atelectasis of the left lung. Right lung remains clear. Heart size appears stable. Right-sided power port remains in appropriate position. IMPRESSION: Increased size of large left pleural effusion and compressive atelectasis. Electronically Signed   By: Earle Gell M.D.   On: 01/27/2018 12:15   Ct Angio Chest Pe W Or Wo Contrast  Result Date: 01/28/2018 CLINICAL DATA:  Five-day history of shortness of breath. History of non-Hodgkin's lymphoma. EXAM: CT ANGIOGRAPHY CHEST WITH CONTRAST TECHNIQUE: Multidetector CT imaging of the chest was performed using the standard protocol during bolus administration of intravenous contrast. Multiplanar CT image reconstructions and MIPs were obtained to evaluate the vascular anatomy. CONTRAST:  118mL ISOVUE-370 IOPAMIDOL (ISOVUE-370) INJECTION 76% COMPARISON:  PET-CT 10/25/2017 FINDINGS: Cardiovascular: The heart is normal in size. No pericardial effusion. The aorta is normal in caliber. No dissection. The branch vessels are patent. The pulmonary arterial tree is fairly well opacified. Breathing motion artifact limits examination but no definite filling defects to suggest pulmonary embolism. Mediastinum/Nodes: Much improved right axillary and subpectoral adenopathy. The mediastinal and hilar adenopathy is also much improved. 10 mm precarinal lymph node on image number 42 previously measured 16 mm. The 22 mm subcarinal nodal mass seen on the prior study has resolved. Lungs/Pleura: Large pleural lesions have significantly improved since the prior examination. The pleural lesion or internal mammary adenopathy on the left side measures 25 mm and previously measured 37 mm. The large pleural mass on the left side slightly more inferiorly has completely resolved. The right internal mammary adenopathy has largely resolved. The  pleural mass at the left lung base  laterally on image number 74 appears relatively stable. Persistent moderate-sized left pleural effusion with associated enhancing pleural nodularity posteriorly appears somewhat improved. Patchy airspace disease in the left upper lobe and lingula, likely pneumonia. This may account for the patient's shortness of breath. The right lung remains clear.  No right-sided pleural effusion. Upper Abdomen: Stable left hepatic lobe cyst. Stable upper abdominal lymphadenopathy. Musculoskeletal: No significant bony findings. Stable small scattered sclerotic bone lesions, likely benign bone islands. Review of the MIP images confirms the above findings. IMPRESSION: 1. No definite CT findings for pulmonary embolism. Exam is somewhat limited by breathing motion artifact. 2. Left upper lobe and lingula pneumonia. 3. Overall improved lymphadenopathy and left-sided pleural disease. Persistent left effusion. Electronically Signed   By: Marijo Sanes M.D.   On: 01/28/2018 10:33   Nm Pet Image Restag (ps) Skull Base To Thigh  Result Date: 02/05/2018 CLINICAL DATA:  Subsequent treatment strategy for mantle cell lymphoma, stage IV. EXAM: NUCLEAR MEDICINE PET SKULL BASE TO THIGH TECHNIQUE: 10.2 mCi F-18 FDG was injected intravenously. Full-ring PET imaging was performed from the skull base to thigh after the radiotracer. CT data was obtained and used for attenuation correction and anatomic localization. Fasting blood glucose: 103 mg/dl COMPARISON:  PET-CT 10/25/2017, 9 /12/18 FINDINGS: Mediastinal blood pool activity: SUV max 2.4 NECK: No hypermetabolic lymph nodes in the neck. Incidental CT findings: none CHEST: There is decreased thickening of the masslike pleural metastasis in the LEFT hemithorax however there is increase in confluence of the pleural involvement. The near entirety of the LEFT hemithorax pleural surface is now involved by the hypermetabolic malignancy with SUV max equal 10.0. Again individual mass lesion decreased in  size. For example 3 cm thick lesion in the anterior aspect of the LEFT upper lobe is decreased to 1 cm. Some lesions remain unchanged. For example mass lesion at the level of the LEFT internal mammary nodes measures 3.4 cm compared to 3.6 cm with SUV max equal 15 not changed. Large loculated LEFT effusion remains. There are intensely hypermetabolic mediastinal lymph nodes and internal mammary lymph nodes on the RIGHT unchanged. Interval resolution of hypermetabolic lymph node in the RIGHT axilla. Resolution of the pleural metastasis in the RIGHT hemithorax. Incidental CT findings: Large loculated effusions in the LEFT hemithorax ABDOMEN/PELVIS: The dominant finding is interval increase in the size the spleen as well as new metabolic activity. Spleen measures 10.8 x 6.8 cm in axial dimension with SUV max equal 10.9 compared to 5.0 by 3.4 cm with no significant metabolic activity background on comparison exam. Conversely, the large mass lesion associated with the rectum is near completely resolved. Lymph nodes in the retroperitoneum have near completely resolved. Example lesion anterior to the RIGHT psoas and IVC previously measured 3.2 cm is no longer measurable. Additional cutaneous nodules have improved and resolved in the pelvis. Example previous nodule in the soft tissues and muscles anterior to the LEFT femoral head has resolved. Incidental CT findings: none SKELETON: No focal hypermetabolic activity to suggest skeletal metastasis. Incidental CT findings: none IMPRESSION: 1. Mixed response to therapy. 2. Decrease in size pleural metastasis in the RIGHT and LEFT lungs. Increase in confluent hypermetabolic pleural lesions LEFT hemithorax. 3. Persistent large loculated LEFT pleural effusions. 4. Persistent hypermetabolic mediastinal lymph nodes and masses 5. 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 6. Marked improvement in mass lesion previous  associated with rectum. 7. Additional improvement  in lymph nodes in the retroperitoneum and subcutaneous nodes. Electronically Signed   By: Suzy Bouchard M.D.   On: 02/05/2018 17:36   Dg Chest Port 1 View  Result Date: 01/28/2018 CLINICAL DATA:  Status post left-sided thoracentesis yesterday. History of lymphoma. EXAM: PORTABLE CHEST 1 VIEW COMPARISON:  Postprocedure chest x-ray of January 27, 2018 FINDINGS: The right lung is well-expanded and clear. On the left there is increased density in the mid to lower lung. The left pleural effusion is smaller but atelectasis or infiltrate is more apparent in the lower 1/2 of the left hemithorax today. The heart is not enlarged. The pulmonary vascularity remains prominent centrally. The power port catheter tip projects over the midportion of the SVC. IMPRESSION: Interval increase conspicuity of atelectasis or pneumonia at the left lung base. Persistent small left pleural effusion. Electronically Signed   By: David  Martinique M.D.   On: 01/28/2018 12:02   US Thoracentesis Asp Pleural Space W/img Guide  Result Date: 02/04/2018 INDICATION: Patient with history of mantle cell lymphoma and recurrent symptomatic malignant left pleural effusion. Request made for therapeutic left thoracentesis. EXAM: ULTRASOUND GUIDED THERAPEUTIC LEFT THORACENTESIS MEDICATIONS: None COMPLICATIONS: None immediate. PROCEDURE: An ultrasound guided thoracentesis was thoroughly discussed with the patient and questions answered. The benefits, risks, alternatives and complications were also discussed. The patient understands and wishes to proceed with the procedure. Written consent was obtained. Ultrasound was performed to localize and mark an adequate pocket of fluid in the left chest. The area was then prepped and draped in the normal sterile fashion. 1% Lidocaine was used for local anesthesia. Under ultrasound guidance a 6 Fr Safe-T-Centesis catheter was introduced. Thoracentesis was performed. The  catheter was removed and a dressing applied. FINDINGS: A total of approximately 2.2 liters of blood-tinged fluid was removed. Due to patient coughing only the above amount of fluid was removed today. IMPRESSION: Successful ultrasound guided therapeutic left thoracentesis yielding 2.2 liters of pleural fluid. Follow-up chest x-ray revealed no pneumothorax. Read by: Rowe Nashaun, PA-C Electronically Signed   By: Markus Daft M.D.   On: 02/04/2018 11:34   US Thoracentesis Asp Pleural Space W/img Guide  Result Date: 01/29/2018 INDICATION: Mantle cell lymphoma. Recurrent malignant pleural effusion. Request for therapeutic thoracentesis. EXAM: ULTRASOUND GUIDED LEFT THORACENTESIS MEDICATIONS: 1% Lidocaine = 10 mL COMPLICATIONS: None immediate. PROCEDURE: An ultrasound guided thoracentesis was thoroughly discussed with the patient and questions answered. The benefits, risks, alternatives and complications were also discussed. The patient understands and wishes to proceed with the procedure. Written consent was obtained. Ultrasound was performed to localize and mark an adequate pocket of fluid in the left chest. The area was then prepped and draped in the normal sterile fashion. 1% Lidocaine was used for local anesthesia. Under ultrasound guidance a 6 Fr Safe-T-Centesis catheter was introduced. Thoracentesis was performed. The catheter was removed and a dressing applied. FINDINGS: A total of approximately 2.2 liters of clear amber fluid was removed. IMPRESSION: Successful ultrasound guided left thoracentesis yielding 2.2 liters of pleural fluid. No pneumothorax on post procedure chest X-ray. Read by: Gareth Eagle, PA-C Electronically Signed   By: Lucrezia Europe M.D.   On: 01/29/2018 14:05   US Thoracentesis Asp Pleural Space W/img Guide  Result Date: 01/27/2018 INDICATION: Patient with history of mantle cell lymphoma with recurrent malignant left pleural effusion, dyspnea. Request made for diagnostic and therapeutic left  thoracentesis. EXAM: ULTRASOUND GUIDED DIAGNOSTIC AND THERAPEUTIC LEFT THORACENTESIS MEDICATIONS: None COMPLICATIONS: None immediate. PROCEDURE: An ultrasound guided thoracentesis was thoroughly discussed  with the patient and questions answered. The benefits, risks, alternatives and complications were also discussed. The patient understands and wishes to proceed with the procedure. Written consent was obtained. Ultrasound was performed to localize and mark an adequate pocket of fluid in the left chest. The area was then prepped and draped in the normal sterile fashion. 1% Lidocaine was used for local anesthesia. Under ultrasound guidance a 6 Fr Safe-T-Centesis catheter was introduced. Thoracentesis was performed. The catheter was removed and a dressing applied. FINDINGS: A total of approximately 2.8 liters of blood-tinged fluid was removed. Samples were sent to the laboratory as requested by the clinical team. IMPRESSION: Successful ultrasound guided diagnostic and therapeutic left thoracentesis yielding 2.8 liters of pleural fluid. Read by: Rowe Sedric, PA-C Electronically Signed   By: Corrie Mckusick D.O.   On: 01/27/2018 17:16

## 2018-02-14 ENCOUNTER — Ambulatory Visit (HOSPITAL_COMMUNITY)
Admission: RE | Admit: 2018-02-14 | Discharge: 2018-02-14 | Disposition: A | Discharge status: Home / Self Care | Payer: Medicare Other | Source: Ambulatory Visit | Attending: Radiology | Admitting: Radiology

## 2018-02-14 ENCOUNTER — Ambulatory Visit (HOSPITAL_COMMUNITY)
Admission: RE | Admit: 2018-02-14 | Discharge: 2018-02-14 | Disposition: A | Discharge status: Home / Self Care | Payer: Medicare Other | Source: Ambulatory Visit | Attending: Hematology | Admitting: Hematology

## 2018-02-14 DIAGNOSIS — Z9889 Other specified postprocedural states: Secondary | ICD-10-CM

## 2018-02-14 DIAGNOSIS — J90 Pleural effusion, not elsewhere classified: Secondary | ICD-10-CM | POA: Insufficient documentation

## 2018-02-14 MED ORDER — VENETOCLAX 10 & 50 & 100 MG PO TBPK: 20.0000 mg | ORAL_TABLET | Freq: Every day | ORAL | 0 refills | Status: DC

## 2018-02-14 MED ORDER — LIDOCAINE HCL 1 % IJ SOLN
INTRAMUSCULAR | Status: AC
  Filled 2018-02-14: qty 20

## 2018-02-14 MED FILL — IMBRUVICA 560 MG TAB: 560 | 28 days supply | Qty: 28 | Fill #2

## 2018-02-14 NOTE — Procedures (Signed)
Ultrasound-guided therapeutic left thoracentesis performed yielding 2.4 liters of blood-tinged  fluid. No immediate complications. Follow-up chest x-ray pending.

## 2018-02-17 ENCOUNTER — Inpatient Hospital Stay: Payer: Medicare Other

## 2018-02-17 ENCOUNTER — Telehealth: Payer: Self-pay | Admitting: Pharmacist

## 2018-02-17 VITALS — BP 110/72 | HR 97 | Temp 98.2°F | Resp 18 | Ht 69.0 in | Wt 182.0 lb

## 2018-02-17 DIAGNOSIS — Z5112 Encounter for antineoplastic immunotherapy: Secondary | ICD-10-CM | POA: Diagnosis not present

## 2018-02-17 DIAGNOSIS — E860 Dehydration: Secondary | ICD-10-CM

## 2018-02-17 DIAGNOSIS — C8318 Mantle cell lymphoma, lymph nodes of multiple sites: Secondary | ICD-10-CM

## 2018-02-17 DIAGNOSIS — Z95828 Presence of other vascular implants and grafts: Secondary | ICD-10-CM

## 2018-02-17 LAB — PHOSPHORUS
PHOSPHORUS: 3.4 mg/dL (ref 2.5–4.6)
Phosphorus: 3.3 mg/dL (ref 2.5–4.6)

## 2018-02-17 LAB — COMPREHENSIVE METABOLIC PANEL
ALBUMIN: 2.5 g/dL — AB (ref 3.5–5.0)
ALK PHOS: 85 U/L (ref 40–150)
ALK PHOS: 84 U/L (ref 40–150)
ALT: 20 U/L (ref 0–55)
ALT: 21 U/L (ref 0–55)
ANION GAP: 8 (ref 3–11)
AST: 10 U/L (ref 5–34)
AST: 18 U/L (ref 5–34)
Albumin: 2.6 g/dL — ABNORMAL LOW (ref 3.5–5.0)
Anion gap: 10 (ref 3–11)
BILIRUBIN TOTAL: 0.6 mg/dL (ref 0.2–1.2)
BUN: 13 mg/dL (ref 7–26)
BUN: 13 mg/dL (ref 7–26)
CALCIUM: 9 mg/dL (ref 8.4–10.4)
CALCIUM: 8.6 mg/dL (ref 8.4–10.4)
CHLORIDE: 104 mmol/L (ref 98–109)
CO2: 27 mmol/L (ref 22–29)
CO2: 26 mmol/L (ref 22–29)
Chloride: 103 mmol/L (ref 98–109)
Creatinine, Ser: 0.89 mg/dL (ref 0.70–1.30)
Creatinine, Ser: 0.79 mg/dL (ref 0.70–1.30)
GFR calc Af Amer: >60 mL/min (ref >60)
GFR calc Af Amer: >60 mL/min (ref >60)
GFR calc non Af Amer: >60 mL/min (ref >60)
Glucose, Bld: 172 mg/dL — ABNORMAL HIGH (ref 70–140)
Glucose, Bld: 213 mg/dL — ABNORMAL HIGH (ref 70–140)
POTASSIUM: 3.9 mmol/L (ref 3.5–5.1)
POTASSIUM: 4.1 mmol/L (ref 3.5–5.1)
SODIUM: 138 mmol/L (ref 136–145)
Sodium: 140 mmol/L (ref 136–145)
TOTAL PROTEIN: 5.6 g/dL — AB (ref 6.4–8.3)
Total Bilirubin: 0.3 mg/dL (ref 0.2–1.2)
Total Protein: 5.2 g/dL — ABNORMAL LOW (ref 6.4–8.3)

## 2018-02-17 LAB — LACTATE DEHYDROGENASE: LDH: 784 U/L — ABNORMAL HIGH (ref 125–245)

## 2018-02-17 LAB — CBC WITH DIFFERENTIAL/PLATELET
BASOS ABS: 0.1 10*3/uL (ref 0.0–0.1)
BASOS PCT: 1 %
Eosinophils Absolute: 0.1 10*3/uL (ref 0.0–0.5)
Eosinophils Relative: 2 %
HCT: 45 % (ref 38.4–49.9)
Hemoglobin: 14.4 g/dL (ref 13.0–17.1)
LYMPHS PCT: 3 %
Lymphs Abs: 0.2 10*3/uL — ABNORMAL LOW (ref 0.9–3.3)
MCH: 28.8 pg (ref 27.2–33.4)
MCHC: 32 g/dL (ref 32.0–36.0)
MCV: 90 fL (ref 79.3–98.0)
MONO ABS: 0.9 10*3/uL (ref 0.1–0.9)
Monocytes Relative: 12 %
Neutro Abs: 6.8 10*3/uL — ABNORMAL HIGH (ref 1.5–6.5)
Neutrophils Relative %: 82 %
PLATELETS: 159 10*3/uL (ref 140–400)
RBC: 5.01 MIL/uL (ref 4.20–5.82)
RDW: 15.7 % — ABNORMAL HIGH (ref 11.0–14.6)
WBC: 8.2 10*3/uL (ref 4.0–10.3)

## 2018-02-17 LAB — URIC ACID
URIC ACID, SERUM: 6 mg/dL (ref 2.6–7.4)
Uric Acid, Serum: 5.6 mg/dL (ref 2.6–7.4)

## 2018-02-17 MED ORDER — HEPARIN SOD (PORK) LOCK FLUSH 100 UNIT/ML IV SOLN
500.0000 [IU] | Freq: Once | INTRAVENOUS | Status: AC
  Administered 2018-02-17: 500 [IU] via INTRAVENOUS
  Filled 2018-02-17: qty 5

## 2018-02-17 MED ORDER — SODIUM CHLORIDE 0.9 % IV SOLN
Freq: Once | INTRAVENOUS | Status: AC
  Administered 2018-02-17: 10:00:00 via INTRAVENOUS

## 2018-02-17 MED ORDER — SODIUM CHLORIDE 0.9% FLUSH
10.0000 mL | INTRAVENOUS | Status: DC | PRN
  Administered 2018-02-17: 10 mL via INTRAVENOUS
  Filled 2018-02-17: qty 10

## 2018-02-17 NOTE — Patient Instructions (Signed)
Dehydration, Adult Dehydration is when there is not enough fluid or water in your body. This happens when you lose more fluids than you take in. Dehydration can range from mild to very bad. It should be treated right away to keep it from getting very bad. Symptoms of mild dehydration may include:  Thirst.  Dry lips.  Slightly dry mouth.  Dry, warm skin.  Dizziness. Symptoms of moderate dehydration may include:  Very dry mouth.  Muscle cramps.  Dark pee (urine). Pee may be the color of tea.  Your body making less pee.  Your eyes making fewer tears.  Heartbeat that is uneven or faster than normal (palpitations).  Headache.  Light-headedness, especially when you stand up from sitting.  Fainting (syncope). Symptoms of very bad dehydration may include:  Changes in skin, such as: ? Cold and clammy skin. ? Blotchy (mottled) or pale skin. ? Skin that does not quickly return to normal after being lightly pinched and let go (poor skin turgor).  Changes in body fluids, such as: ? Feeling very thirsty. ? Your eyes making fewer tears. ? Not sweating when body temperature is high, such as in hot weather. ? Your body making very little pee.  Changes in vital signs, such as: ? Weak pulse. ? Pulse that is more than 100 beats a minute when you are sitting still. ? Fast breathing. ? Low blood pressure.  Other changes, such as: ? Sunken eyes. ? Cold hands and feet. ? Confusion. ? Lack of energy (lethargy). ? Trouble waking up from sleep. ? Short-term weight loss. ? Unconsciousness. Follow these instructions at home:  If told by your doctor, drink an ORS: ? Make an ORS by using instructions on the package. ? Start by drinking small amounts, about  cup (120 mL) every 5-10 minutes. ? Slowly drink more until you have had the amount that your doctor said to have.  Drink enough clear fluid to keep your pee clear or pale yellow. If you were told to drink an ORS, finish the ORS  first, then start slowly drinking clear fluids. Drink fluids such as: ? Water. Do not drink only water by itself. Doing that can make the salt (sodium) level in your body get too low (hyponatremia). ? Ice chips. ? Fruit juice that you have added water to (diluted). ? Low-calorie sports drinks.  Avoid: ? Alcohol. ? Drinks that have a lot of sugar. These include high-calorie sports drinks, fruit juice that does not have water added, and soda. ? Caffeine. ? Foods that are greasy or have a lot of fat or sugar.  Take over-the-counter and prescription medicines only as told by your doctor.  Do not take salt tablets. Doing that can make the salt level in your body get too high (hypernatremia).  Eat foods that have minerals (electrolytes). Examples include bananas, oranges, potatoes, tomatoes, and spinach.  Keep all follow-up visits as told by your doctor. This is important. Contact a doctor if:  You have belly (abdominal) pain that: ? Gets worse. ? Stays in one area (localizes).  You have a rash.  You have a stiff neck.  You get angry or annoyed more easily than normal (irritability).  You are more sleepy than normal.  You have a harder time waking up than normal.  You feel: ? Weak. ? Dizzy. ? Very thirsty.  You have peed (urinated) only a small amount of very dark pee during 6-8 hours. Get help right away if:  You have symptoms of   very bad dehydration.  You cannot drink fluids without throwing up (vomiting).  Your symptoms get worse with treatment.  You have a fever.  You have a very bad headache.  You are throwing up or having watery poop (diarrhea) and it: ? Gets worse. ? Does not go away.  You have blood or something green (bile) in your throw-up.  You have blood in your poop (stool). This may cause poop to look black and tarry.  You have not peed in 6-8 hours.  You pass out (faint).  Your heart rate when you are sitting still is more than 100 beats a  minute.  You have trouble breathing. This information is not intended to replace advice given to you by your health care provider. Make sure you discuss any questions you have with your health care provider. Document Released: 07/21/2009 Document Revised: 04/13/2016 Document Reviewed: 11/18/2015 Elsevier Interactive Patient Education  2018 [employer]  

## 2018-02-17 NOTE — Telephone Encounter (Signed)
Oral Chemotherapy Pharmacist Encounter  I spoke with patient and daughter, Kristopher Johnson, in [facility] lobby for overview of: Venclexta (venetoclax)for the treatment of relapsed mantle cell lymphomain conjunction with Imbruvica (ibrutinib), planned durationuntil disease progression or unacceptable toxicity. Patient previously started on Imbruvica 560 mg once daily.  Imbruvica start date: 10/29/2017  I had previously spoken with patient in infusion on 02/13/18, this was my first counseling interaction with patient's daughter.  I picked up patient's Venclexta, Imbruvica, and allopruinol from the [facility] this morning and have delivered to collaborative practice RN in symptom management clinic. Patient will receive IV fluids for TLS prevention at the office today.  Venclexta dose will be titrated up per manufacturer recommendations for CLL initiation as was done in relapsed/refractory mantle cell lymphoma trial (Tam CS, et al. Alene Mires 2018;378(13):1211-1223).  Counseled on administration, dosing, side effects, monitoring, drug-food interactions, safe handling, storage, and disposal.  Patient will take Venclexta 20mg  tablets, 1 tablet by mouth once daily with food and water for 7 days. Venclexta will be initiated outpatient based on risk of TLS and discussion with MD.  02/17/18 baseline line labs show continued increase in LDH  CBC is WNL Glucose is high, all other labs WNL Uric acid, and electrolytes will be monitored at 4-6 hours post dose administration per protocol. Hydration is being be administered IV. Patient will return tomorrow AM, after CT simulation, for repeat laboratory assessment.  Venclexta start date: 02/17/18  Patient was instructed to start allopurinol 300mg  every 24 hours.  Patient was instructed to increase fluid intake to 1.5 - 2L of water per day starting 2 days prior to [facility] initiation.  Patient will take Venclexta 50mg  tablets, 1 tablet by  mouth once daily with food and water for 7 days. This will occur on 02/24/18 in the office with baseline labs drawn, dose administered, fluids administered and then labs checked 4-6 hours post dose. Patient will return 24 hours later for repeat laboratory assessment.  Subsequent ramp-up doses of 100mg  daily x 7 days, 200mg  daily x 7 days, and 400mg  daily (target maintenance dose) will be monitored per protocol.  Side effects include but not limited to: TLS, decreased blood counts, electrolyte abnormalities, diarrhea, nausea, fatigue, arthralgias/myalgias, and upper respiratory tract infection.    Patient has anti-emetic on hand and knows to take it if nausea develops.   Patient will obtain anti diarrheal and alert the office of 4 or more loose stools above baseline.  Reviewed with daughter importance of keeping a medication schedule and plan for any missed doses.  Kristopher Johnson and Kristopher Johnson voiced understanding and appreciation.   All questions answered. Medication reconciliation performed and medication/allergy list updated.  They knows to call the office with questions or concerns. Oral Oncology Clinic will continue to follow.  Thank you,  Johny Drilling, PharmD, BCPS, BCOP  02/17/2018  10:17 AM  Oral Oncology Clinic [phone]

## 2018-02-18 ENCOUNTER — Ambulatory Visit (INDEPENDENT_AMBULATORY_CARE_PROVIDER_SITE_OTHER): Payer: Medicare Other | Admitting: Internal Medicine

## 2018-02-18 ENCOUNTER — Encounter: Payer: Self-pay | Admitting: Internal Medicine

## 2018-02-18 ENCOUNTER — Ambulatory Visit: Admission: RE | Admit: 2018-02-18 | Payer: Medicare Other | Source: Ambulatory Visit | Admitting: Radiation Oncology

## 2018-02-18 ENCOUNTER — Inpatient Hospital Stay: Payer: Medicare Other

## 2018-02-18 VITALS — BP 116/72 | HR 95 | Temp 97.6°F | Resp 18 | Ht 68.0 in | Wt 185.4 lb

## 2018-02-18 DIAGNOSIS — C8318 Mantle cell lymphoma, lymph nodes of multiple sites: Secondary | ICD-10-CM

## 2018-02-18 DIAGNOSIS — K4020 Bilateral inguinal hernia, without obstruction or gangrene, not specified as recurrent: Secondary | ICD-10-CM

## 2018-02-18 DIAGNOSIS — H3552 Pigmentary retinal dystrophy: Secondary | ICD-10-CM | POA: Diagnosis not present

## 2018-02-18 DIAGNOSIS — J910 Malignant pleural effusion: Secondary | ICD-10-CM | POA: Diagnosis not present

## 2018-02-18 DIAGNOSIS — E43 Unspecified severe protein-calorie malnutrition: Secondary | ICD-10-CM

## 2018-02-18 DIAGNOSIS — R161 Splenomegaly, not elsewhere classified: Secondary | ICD-10-CM | POA: Diagnosis not present

## 2018-02-18 DIAGNOSIS — Z720 Tobacco use: Secondary | ICD-10-CM

## 2018-02-18 DIAGNOSIS — Z5112 Encounter for antineoplastic immunotherapy: Secondary | ICD-10-CM | POA: Diagnosis not present

## 2018-02-18 LAB — COMPREHENSIVE METABOLIC PANEL
ALBUMIN: 2.8 g/dL — AB (ref 3.5–5.0)
ALK PHOS: 91 U/L (ref 40–150)
ALT: 23 U/L (ref 0–55)
ANION GAP: 10 (ref 3–11)
AST: 13 U/L (ref 5–34)
BILIRUBIN TOTAL: 0.5 mg/dL (ref 0.2–1.2)
BUN: 13 mg/dL (ref 7–26)
CO2: 26 mmol/L (ref 22–29)
Calcium: 9.3 mg/dL (ref 8.4–10.4)
Chloride: 102 mmol/L (ref 98–109)
Creatinine, Ser: 0.96 mg/dL (ref 0.70–1.30)
GFR calc Af Amer: >60 mL/min (ref >60)
GFR calc non Af Amer: >60 mL/min (ref >60)
GLUCOSE: 188 mg/dL — AB (ref 70–140)
Potassium: 3.9 mmol/L (ref 3.5–5.1)
Sodium: 138 mmol/L (ref 136–145)
TOTAL PROTEIN: 5.9 g/dL — AB (ref 6.4–8.3)

## 2018-02-18 LAB — PHOSPHORUS: Phosphorus: 3.4 mg/dL (ref 2.5–4.6)

## 2018-02-18 LAB — CBC WITH DIFFERENTIAL/PLATELET
BASOS PCT: 1 %
Basophils Absolute: 0.1 10*3/uL (ref 0.0–0.1)
EOS ABS: 0.1 10*3/uL (ref 0.0–0.5)
Eosinophils Relative: 1 %
HCT: 45.2 % (ref 38.4–49.9)
Hemoglobin: 14.3 g/dL (ref 13.0–17.1)
Lymphocytes Relative: 2 %
Lymphs Abs: 0.2 10*3/uL — ABNORMAL LOW (ref 0.9–3.3)
MCH: 28.6 pg (ref 27.2–33.4)
MCHC: 31.6 g/dL — AB (ref 32.0–36.0)
MCV: 90.4 fL (ref 79.3–98.0)
MONO ABS: 1 10*3/uL — AB (ref 0.1–0.9)
MONOS PCT: 10 %
Neutro Abs: 9.3 10*3/uL — ABNORMAL HIGH (ref 1.5–6.5)
Neutrophils Relative %: 86 %
Platelets: 155 10*3/uL (ref 140–400)
RBC: 5 MIL/uL (ref 4.20–5.82)
RDW: 15.8 % — AB (ref 11.0–14.6)
WBC: 10.8 10*3/uL — ABNORMAL HIGH (ref 4.0–10.3)

## 2018-02-18 LAB — LACTATE DEHYDROGENASE: LDH: 724 U/L — ABNORMAL HIGH (ref 125–245)

## 2018-02-18 LAB — URIC ACID: Uric Acid, Serum: 5.7 mg/dL (ref 2.6–7.4)

## 2018-02-18 NOTE — Progress Notes (Signed)
Patient ID: Kristopher Johnson, male   DOB: 1956-04-07, 62 y.o.   MRN: [id number]   Location:  [facility] OFFICE  Provider: DR Arletha Grippe  Code Status: Goals of Care:  Advanced Directives 02/17/2018  Does Patient Have a Medical Advance Directive? No  Would patient like information on creating a medical advance directive? No - Patient declined     Chief Complaint  Patient presents with  . New Patient (Initial Visit)    Establishing care with Dr. Eulas Post. No acute concerns at this time.   . Medication Refill    No refills needed at this time    HPI: Patient is a 62 y.o. male seen today as a new pt.  He c/o pain in left flank and upper abdomen, decreased appetite, SOB that worsens with lying flat. 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 Daughter brought him to the office today. Prior to cancer dx in 2017, he had not been to the doctor in > 20 yrs.  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 He is to start XRT next week. Plan to have US guided thoracentesis on 02/20/18 by IR. He c/o severe left flank and chest pain. Pain uncontrolled but only takes tramadol 100mg  daily prn.  Severe protein calorie malnutrition - albumin 2.5; Tprotein 5.2. He reports poor appetite.   Tobacco abuse - current; no plans to quit.  Past Medical History:  Diagnosis Date  . Abscess of arm, right 10/27/2015  . Lymphoma of lymph nodes of multiple sites ([place]) 10/27/2015  . 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 Shortness of breath 10/27/2015    Past Surgical History:  Procedure Laterality Date  . Cataract surgery Bilateral    Patient notes she has had bilateral Surgery in 1998 and 99  . FLEXIBLE SIGMOIDOSCOPY N/A 03/01/2017   Procedure: FLEXIBLE SIGMOIDOSCOPY;  Surgeon: Manus Gunning,  MD;  Location: [facility];  Service: Gastroenterology;  Laterality: N/A;  . TONSILLECTOMY     about 62years old     reports that he has been smoking cigarettes.  He has a 33.00 pack-year smoking history. He has never used smokeless tobacco. He reports that he does not drink alcohol or use drugs. Social History   Socioeconomic History  . Marital status: Widowed    Spouse name: Not on file  . Number of children: Not on file  . Years of education: Not on file  . Highest education level: Not on file  Occupational History  . Not on file  Social Needs  . Financial resource strain: Not on file  . Food insecurity:    Worry: Not on file    Inability: Not on file  . Transportation needs:    Medical: Not on file    Non-medical: Not on file  Tobacco Use  . Smoking status: Current Every Day Smoker    Packs/day: 1.00    Years: 33.00    Pack years: 33.00    Types: Cigarettes  . Smokeless tobacco: Never Used  Substance and Sexual Activity  . Alcohol use: No  . Drug use: No  . Sexual activity: Not on file    Comment: Disabled, retinis pigmentosa legally blind, 4children MPOA Lynnn(cousin)  Lifestyle  . Physical activity:    Days per week: Not  on file    Minutes per session: Not on file  . Stress: Not on file  Relationships  . Social connections:    Talks on phone: Not on file    Gets together: Not on file    Attends religious service: Not on file    Active member of club or organization: Not on file    Attends meetings of clubs or organizations: Not on file    Relationship status: Not on file  . Intimate partner violence:    Fear of current or ex partner: Not on file    Emotionally abused: Not on file    Physically abused: Not on file    Forced sexual activity: Not on file  Other Topics Concern  . Not on file  Social History Narrative  . Not on file    Family History  Problem Relation Age of Onset  . Colon cancer Neg Hx     No Known Allergies  Outpatient Encounter  Medications as of 02/18/2018  Medication Sig  . Ibrutinib 560 MG TABS Take 560 mg by mouth daily. Take with a full glass of water at approximately the same time daily, maintain hydration.  . traMADol (ULTRAM) 50 MG tablet Take 2 tablets (100 mg total) by mouth every 4 (four) hours as needed for moderate pain or severe pain.  . Venetoclax 10 & 50 & 100 MG TBPK Take 20 mg by mouth daily. JSEG3: Take 50mg  daily x 7 days. TDVV6: Take 100mg  daily x 7 days. HYWV3: Take 200mg  daily x 7 days.  [name] Kitchen allopurinol (ZYLOPRIM) 300 MG tablet Take 1 tablet (300 mg total) by mouth daily. (Patient not taking: Reported on 02/18/2018)  . pantoprazole (PROTONIX) 40 MG tablet Take 1 tablet (40 mg total) by mouth daily. (Patient not taking: Reported on 02/06/2018)  . senna-docusate (SENNA S) 8.6-50 MG tablet Take 2 tablets by mouth at bedtime. (Patient not taking: Reported on 02/18/2018)  . [DISCONTINUED] prochlorperazine (COMPAZINE) 10 MG tablet Take 1 tablet (10 mg total) by mouth every 6 (six) hours as needed (Nausea or vomiting).   No facility-administered encounter medications on file as of 02/18/2018.     Review of Systems:  Review of Systems  Constitutional: Positive for appetite change, chills, diaphoresis, fatigue, fever and unexpected weight change (loss).  Eyes: Positive for visual disturbance.  Respiratory: Positive for shortness of breath.   Endocrine:       Hot flashes  Musculoskeletal:       Joint stiffness  Skin: Positive for rash.  Neurological: Positive for dizziness and weakness.       Loss of balance  Psychiatric/Behavioral: Positive for confusion (memory loss) and dysphoric mood.  All other systems reviewed and are negative.   Health Maintenance  Topic Date Due  . TETANUS/TDAP  11/29/1972  . COLONOSCOPY  11/30/2003  . INFLUENZA VACCINE  05/08/2018  . Hepatitis C Screening  Completed  . HIV Screening  Completed    Physical Exam: Vitals:   02/18/18 1113  BP: 116/72  Pulse: 95  Resp: 18    Temp: 97.6 F (36.4 C)  TempSrc: Oral  SpO2: 95%  Weight: 185 lb 6.4 oz (84.1 kg)  Height: 5\' 8"  (1.727 m)   Body mass index is 28.19 kg/m. Physical Exam  Constitutional: He is oriented to person, place, and time. He appears well-developed. He appears distressed.  Frail appearing, mod conversational dyspnea  HENT:  Mouth/Throat: Oropharynx is clear and moist.  MMM; no oral thrush  Eyes: Pupils  are equal, round, and reactive to light. No scleral icterus.  Neck: Neck supple. Carotid bruit is not present. No thyromegaly present.  Cardiovascular: Normal rate, regular rhythm and intact distal pulses. Exam reveals no gallop and no friction rub.  Murmur (1/6 SEM) heard. Right ACW port-a-cath  Pulmonary/Chest: He is in respiratory distress. He has decreased breath sounds (left base). He has no wheezes. He has no rhonchi. He has no rales. He exhibits no tenderness. Breasts are asymmetrical.    Abdominal: Soft. Bowel sounds are normal. He exhibits distension. He exhibits no abdominal bruit, no pulsatile midline mass and no mass. There is no hepatomegaly. There is no tenderness. There is no rebound and no guarding.  Obese; LLQ palpable mass  Musculoskeletal: He exhibits edema.  Lymphadenopathy:    He has no cervical adenopathy.  Neurological: He is alert and oriented to person, place, and time. He has normal reflexes.  Skin: Skin is warm and dry. No rash noted.  Psychiatric: He has a normal mood and affect. His behavior is normal. Judgment and thought content normal.    Labs reviewed: Basic Metabolic Panel: Recent Labs    05/14/17 0910  01/27/18 1340  01/29/18 0540  02/17/18 0907 02/17/18 1441 02/18/18 0929  NA 141   < > 143   < > 140   < > 140 138 138  K 3.6   < > 3.3*   < > 3.4*   < > 3.9 4.1 3.9  CL  --    < > 107   < > 105   < > 103 104 102  CO2 29   < > 28   < > 26   < > 27 26 26   GLUCOSE 163*   < > 241*   < > 144*   < > 172* 213* 188*  BUN 11.1   < > 9   < > 9   < > 13 13  13   CREATININE 0.9   < > 0.95   < > 0.80   < > 0.89 0.79 0.96  CALCIUM 9.4   < > 8.7*   < > 8.3*   < > 9.0 8.6 9.3  MG 2.2  --  1.8  --  1.6*  --   --   --   --   PHOS  --    < >  --   --   --   --  3.3 3.4 3.4   < > = values in this interval not displayed.   Liver Function Tests: Recent Labs    02/17/18 0907 02/17/18 1441 02/18/18 0929  AST 10 18 13   ALT 20 21 23   ALKPHOS 85 84 91  BILITOT 0.6 0.3 0.5  PROT 5.6* 5.2* 5.9*  ALBUMIN 2.6* 2.5* 2.8*   No results for input(s): LIPASE, AMYLASE in the last 8760 hours. No results for input(s): AMMONIA in the last 8760 hours. CBC: Recent Labs    02/13/18 0841 02/17/18 0907 02/18/18 0929  WBC 13.1* 8.2 10.8*  NEUTROABS 7.8* 6.8* 9.3*  HGB 14.2 14.4 14.3  HCT 44.8 45.0 45.2  MCV 89.4 90.0 90.4  PLT 141 159 155   Lipid Panel: No results for input(s): CHOL, HDL, LDLCALC, TRIG, CHOLHDL, LDLDIRECT in the last 8760 hours. Lab Results  Component Value Date   HGBA1C 6.2 (H) 10/25/2015    Procedures since last visit: Dg Chest 1 View  Result Date: 02/14/2018 CLINICAL DATA:  Status post LEFT thoracentesis today. EXAM: CHEST  1 VIEW COMPARISON:  02/06/2018 and prior studies FINDINGS: Decreased size of loculated LEFT pleural effusion noted. There is no evidence of pneumothorax. LEFT LOWER lung atelectasis/opacity again noted. A RIGHT Port-A-Cath with tip overlying the LOWER SVC again noted. The RIGHT lung is clear. IMPRESSION: Decreased loculated LEFT pleural effusion. No evidence of pneumothorax. Electronically Signed   By: Margarette Canada M.D.   On: 02/14/2018 12:55   Dg Chest 1 View  Result Date: 02/04/2018 CLINICAL DATA:  Status post thoracentesis EXAM: CHEST  1 VIEW COMPARISON:  February 03, 2018 FINDINGS: No pneumothorax. There is moderate loculated effusion on the left currently with patchy consolidation in the left base. Right lung is clear. Heart size and pulmonary vascularity are normal. Port-A-Cath tip is in the superior vena cava. No  adenopathy. No bone lesions. IMPRESSION: Moderate residual loculated effusion after thoracentesis. No pneumothorax. There is patchy consolidation in the left base as well. Lungs elsewhere clear. Heart size normal. Electronically Signed   By: Lowella Grip III M.D.   On: 02/04/2018 10:46   Dg Chest 1 View  Result Date: 01/29/2018 CLINICAL DATA:  Post left thoracentesis EXAM: CHEST  1 VIEW COMPARISON:  01/28/2018 FINDINGS: Small to moderate residual left effusion following thoracentesis. No pneumothorax. Port-A-Cath is in place, unchanged. Heart is upper limits normal in size. Left base atelectasis or infiltrate, improved since prior study. IMPRESSION: Improving left pleural effusion and left base atelectasis or infiltrate. No pneumothorax. Electronically Signed   By: Rolm Baptise M.D.   On: 01/29/2018 13:24   Dg Chest 1 View  Result Date: 01/27/2018 CLINICAL DATA:  62 year old male status post left thoracentesis EXAM: CHEST  1 VIEW COMPARISON:  01/27/2018 FINDINGS: Cardiomediastinal silhouette likely unchanged with the left heart border partially obscured by residual opacity at the left lung base. Compared to the prior there is improved aeration with decreased opacity on the left. No pneumothorax. Right lung relatively well aerated. Port catheter on the right chest wall, unchanged. No displaced fracture. IMPRESSION: No complicating features status post left-sided thoracentesis. Residual opacity at the left base, likely a combination of residual fluid, atelectasis/consolidation, and soft tissue in this patient with known lymphoma. Electronically Signed   By: Corrie Mckusick D.O.   On: 01/27/2018 17:42   Dg Chest 2 View  Result Date: 02/06/2018 CLINICAL DATA:  Status post left-sided thoracentesis last week. The patient is were reporting worsening left-sided chest pain and shortness of breath. EXAM: CHEST - 2 VIEW COMPARISON:  PET-CT study of Feb 05, 2018 and chest x-ray of February 04, 2018 FINDINGS: There has  been reaccumulation of pleural fluid on the left. It occupies approximately 1/2 of the pleural space volume. There is no significant mediastinal shift. The right lung is clear. The heart and pulmonary vascularity are normal. The power port catheter tip projects over the midportion of the SVC. The bony thorax exhibits no acute abnormality. IMPRESSION: Interval re-accumulation of the left pleural effusion over the past 2 days. No pneumothorax or mediastinal shift. No CHF. Electronically Signed   By: David  Martinique M.D.   On: 02/06/2018 11:18   Dg Chest 2 View  Result Date: 02/03/2018 CLINICAL DATA:  Shortness of breath. EXAM: CHEST - 2 VIEW COMPARISON:  Radiograph of January 29, 2018. FINDINGS: Mild cardiomegaly is noted. Right lung is clear. Right internal jugular Port-A-Cath is noted with distal tip in expected position of SVC. Large left pleural effusion is noted with underlying atelectasis or infiltrate. Bony thorax is unremarkable. IMPRESSION: Large left pleural effusion  is noted with underlying atelectasis or infiltrate. Electronically Signed   By: Marijo Conception, M.D.   On: 02/03/2018 11:44   Dg Chest 2 View  Result Date: 01/27/2018 CLINICAL DATA:  Worsening shortness of breath for 4 days. Mantle cell lymphoma. EXAM: CHEST - 2 VIEW COMPARISON:  11/28/2017 FINDINGS: Large left pleural effusion has increased in size since previous study, with compressive atelectasis of the left lung. Right lung remains clear. Heart size appears stable. Right-sided power port remains in appropriate position. IMPRESSION: Increased size of large left pleural effusion and compressive atelectasis. Electronically Signed   By: Earle Gell M.D.   On: 01/27/2018 12:15   Ct Angio Chest Pe W Or Wo Contrast  Result Date: 01/28/2018 CLINICAL DATA:  Five-day history of shortness of breath. History of non-Hodgkin's lymphoma. EXAM: CT ANGIOGRAPHY CHEST WITH CONTRAST TECHNIQUE: Multidetector CT imaging of the chest was performed using  the standard protocol during bolus administration of intravenous contrast. Multiplanar CT image reconstructions and MIPs were obtained to evaluate the vascular anatomy. CONTRAST:  126mL ISOVUE-370 IOPAMIDOL (ISOVUE-370) INJECTION 76% COMPARISON:  PET-CT 10/25/2017 FINDINGS: Cardiovascular: The heart is normal in size. No pericardial effusion. The aorta is normal in caliber. No dissection. The branch vessels are patent. The pulmonary arterial tree is fairly well opacified. Breathing motion artifact limits examination but no definite filling defects to suggest pulmonary embolism. Mediastinum/Nodes: Much improved right axillary and subpectoral adenopathy. The mediastinal and hilar adenopathy is also much improved. 10 mm precarinal lymph node on image number 42 previously measured 16 mm. The 22 mm subcarinal nodal mass seen on the prior study has resolved. Lungs/Pleura: Large pleural lesions have significantly improved since the prior examination. The pleural lesion or internal mammary adenopathy on the left side measures 25 mm and previously measured 37 mm. The large pleural mass on the left side slightly more inferiorly has completely resolved. The right internal mammary adenopathy has largely resolved. The pleural mass at the left lung base laterally on image number 74 appears relatively stable. Persistent moderate-sized left pleural effusion with associated enhancing pleural nodularity posteriorly appears somewhat improved. Patchy airspace disease in the left upper lobe and lingula, likely pneumonia. This may account for the patient's shortness of breath. The right lung remains clear.  No right-sided pleural effusion. Upper Abdomen: Stable left hepatic lobe cyst. Stable upper abdominal lymphadenopathy. Musculoskeletal: No significant bony findings. Stable small scattered sclerotic bone lesions, likely benign bone islands. Review of the MIP images confirms the above findings. IMPRESSION: 1. No definite CT findings  for pulmonary embolism. Exam is somewhat limited by breathing motion artifact. 2. Left upper lobe and lingula pneumonia. 3. Overall improved lymphadenopathy and left-sided pleural disease. Persistent left effusion. Electronically Signed   By: Marijo Sanes M.D.   On: 01/28/2018 10:33   Nm Pet Image Restag (ps) Skull Base To Thigh  Result Date: 02/05/2018 CLINICAL DATA:  Subsequent treatment strategy for mantle cell lymphoma, stage IV. EXAM: NUCLEAR MEDICINE PET SKULL BASE TO THIGH TECHNIQUE: 10.2 mCi F-18 FDG was injected intravenously. Full-ring PET imaging was performed from the skull base to thigh after the radiotracer. CT data was obtained and used for attenuation correction and anatomic localization. Fasting blood glucose: 103 mg/dl COMPARISON:  PET-CT 10/25/2017, 9 /12/18 FINDINGS: Mediastinal blood pool activity: SUV max 2.4 NECK: No hypermetabolic lymph nodes in the neck. Incidental CT findings: none CHEST: There is decreased thickening of the masslike pleural metastasis in the LEFT hemithorax however there is increase in confluence of the pleural  involvement. The near entirety of the LEFT hemithorax pleural surface is now involved by the hypermetabolic malignancy with SUV max equal 10.0. Again individual mass lesion decreased in size. For example 3 cm thick lesion in the anterior aspect of the LEFT upper lobe is decreased to 1 cm. Some lesions remain unchanged. For example mass lesion at the level of the LEFT internal mammary nodes measures 3.4 cm compared to 3.6 cm with SUV max equal 15 not changed. Large loculated LEFT effusion remains. There are intensely hypermetabolic mediastinal lymph nodes and internal mammary lymph nodes on the RIGHT unchanged. Interval resolution of hypermetabolic lymph node in the RIGHT axilla. Resolution of the pleural metastasis in the RIGHT hemithorax. Incidental CT findings: Large loculated effusions in the LEFT hemithorax ABDOMEN/PELVIS: The dominant finding is interval  increase in the size the spleen as well as new metabolic activity. Spleen measures 10.8 x 6.8 cm in axial dimension with SUV max equal 10.9 compared to 5.0 by 3.4 cm with no significant metabolic activity background on comparison exam. Conversely, the large mass lesion associated with the rectum is near completely resolved. Lymph nodes in the retroperitoneum have near completely resolved. Example lesion anterior to the RIGHT psoas and IVC previously measured 3.2 cm is no longer measurable. Additional cutaneous nodules have improved and resolved in the pelvis. Example previous nodule in the soft tissues and muscles anterior to the LEFT femoral head has resolved. Incidental CT findings: none SKELETON: No focal hypermetabolic activity to suggest skeletal metastasis. Incidental CT findings: none IMPRESSION: 1. Mixed response to therapy. 2. Decrease in size pleural metastasis in the RIGHT and LEFT lungs. Increase in confluent hypermetabolic pleural lesions LEFT hemithorax. 3. Persistent large loculated LEFT pleural effusions. 4. Persistent hypermetabolic mediastinal lymph nodes and masses 5. 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 6. Marked improvement in mass lesion previous associated with rectum. 7. Additional improvement in lymph nodes in the retroperitoneum and subcutaneous nodes. Electronically Signed   By: Suzy Bouchard M.D.   On: 02/05/2018 17:36   Dg Chest Port 1 View  Result Date: 01/28/2018 CLINICAL DATA:  Status post left-sided thoracentesis yesterday. History of lymphoma. EXAM: PORTABLE CHEST 1 VIEW COMPARISON:  Postprocedure chest x-ray of January 27, 2018 FINDINGS: The right lung is well-expanded and clear. On the left there is increased density in the mid to lower lung. The left pleural effusion is smaller but atelectasis or infiltrate is more apparent in the lower 1/2 of the left hemithorax today. The heart is not enlarged. The  pulmonary vascularity remains prominent centrally. The power port catheter tip projects over the midportion of the SVC. IMPRESSION: Interval increase conspicuity of atelectasis or pneumonia at the left lung base. Persistent small left pleural effusion. Electronically Signed   By: David  Martinique M.D.   On: 01/28/2018 12:02   US Thoracentesis Asp Pleural Space W/img Guide  Result Date: 02/14/2018 INDICATION: Patient with history of mantle cell lymphoma with recurrent symptomatic malignant left pleural effusion. Request made for therapeutic left thoracentesis. EXAM: ULTRASOUND GUIDED THERAPEUTIC LEFT THORACENTESIS MEDICATIONS: None COMPLICATIONS: None immediate. PROCEDURE: An ultrasound guided thoracentesis was thoroughly discussed with the patient and questions answered. The benefits, risks, alternatives and complications were also discussed. The patient understands and wishes to proceed with the procedure. Written consent was obtained. Ultrasound was performed to localize and mark an adequate pocket of fluid in the left chest. The area was then prepped and draped in the normal sterile fashion. 1%  Lidocaine was used for local anesthesia. Under ultrasound guidance a 6 Fr Safe-T-Centesis catheter was introduced. Thoracentesis was performed. The catheter was removed and a dressing applied. FINDINGS: A total of approximately 2.4 liters of blood-tinged fluid was removed. IMPRESSION: Successful ultrasound guided therapeutic left thoracentesis yielding 2.4 liters of pleural fluid. Follow-up chest x-ray revealed no pneumothorax. Read by: Rowe Kartier, PA-C Electronically Signed   By: Jacqulynn Cadet M.D.   On: 02/14/2018 13:24   US Thoracentesis Asp Pleural Space W/img Guide  Result Date: 02/04/2018 INDICATION: Patient with history of mantle cell lymphoma and recurrent symptomatic malignant left pleural effusion. Request made for therapeutic left thoracentesis. EXAM: ULTRASOUND GUIDED THERAPEUTIC LEFT THORACENTESIS  MEDICATIONS: None COMPLICATIONS: None immediate. PROCEDURE: An ultrasound guided thoracentesis was thoroughly discussed with the patient and questions answered. The benefits, risks, alternatives and complications were also discussed. The patient understands and wishes to proceed with the procedure. Written consent was obtained. Ultrasound was performed to localize and mark an adequate pocket of fluid in the left chest. The area was then prepped and draped in the normal sterile fashion. 1% Lidocaine was used for local anesthesia. Under ultrasound guidance a 6 Fr Safe-T-Centesis catheter was introduced. Thoracentesis was performed. The catheter was removed and a dressing applied. FINDINGS: A total of approximately 2.2 liters of blood-tinged fluid was removed. Due to patient coughing only the above amount of fluid was removed today. IMPRESSION: Successful ultrasound guided therapeutic left thoracentesis yielding 2.2 liters of pleural fluid. Follow-up chest x-ray revealed no pneumothorax. Read by: Rowe Tomas, PA-C Electronically Signed   By: Markus Daft M.D.   On: 02/04/2018 11:34   US Thoracentesis Asp Pleural Space W/img Guide  Result Date: 01/29/2018 INDICATION: Mantle cell lymphoma. Recurrent malignant pleural effusion. Request for therapeutic thoracentesis. EXAM: ULTRASOUND GUIDED LEFT THORACENTESIS MEDICATIONS: 1% Lidocaine = 10 mL COMPLICATIONS: None immediate. PROCEDURE: An ultrasound guided thoracentesis was thoroughly discussed with the patient and questions answered. The benefits, risks, alternatives and complications were also discussed. The patient understands and wishes to proceed with the procedure. Written consent was obtained. Ultrasound was performed to localize and mark an adequate pocket of fluid in the left chest. The area was then prepped and draped in the normal sterile fashion. 1% Lidocaine was used for local anesthesia. Under ultrasound guidance a 6 Fr Safe-T-Centesis catheter was  introduced. Thoracentesis was performed. The catheter was removed and a dressing applied. FINDINGS: A total of approximately 2.2 liters of clear amber fluid was removed. IMPRESSION: Successful ultrasound guided left thoracentesis yielding 2.2 liters of pleural fluid. No pneumothorax on post procedure chest X-ray. Read by: Gareth Eagle, PA-C Electronically Signed   By: Lucrezia Europe M.D.   On: 01/29/2018 14:05   US Thoracentesis Asp Pleural Space W/img Guide  Result Date: 01/27/2018 INDICATION: Patient with history of mantle cell lymphoma with recurrent malignant left pleural effusion, dyspnea. Request made for diagnostic and therapeutic left thoracentesis. EXAM: ULTRASOUND GUIDED DIAGNOSTIC AND THERAPEUTIC LEFT THORACENTESIS MEDICATIONS: None COMPLICATIONS: None immediate. PROCEDURE: An ultrasound guided thoracentesis was thoroughly discussed with the patient and questions answered. The benefits, risks, alternatives and complications were also discussed. The patient understands and wishes to proceed with the procedure. Written consent was obtained. Ultrasound was performed to localize and mark an adequate pocket of fluid in the left chest. The area was then prepped and draped in the normal sterile fashion. 1% Lidocaine was used for local anesthesia. Under ultrasound guidance a 6 Fr Safe-T-Centesis catheter was introduced. Thoracentesis was performed. The catheter was  removed and a dressing applied. FINDINGS: A total of approximately 2.8 liters of blood-tinged fluid was removed. Samples were sent to the laboratory as requested by the clinical team. IMPRESSION: Successful ultrasound guided diagnostic and therapeutic left thoracentesis yielding 2.8 liters of pleural fluid. Read by: Rowe Skylar, PA-C Electronically Signed   By: Corrie Mckusick D.O.   On: 01/27/2018 17:16    Assessment/Plan   ICD-10-CM   1. 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 Splenomegaly R16.1   3. 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 Severe protein-calorie malnutrition ([place]) E43   5. Bilateral inguinal hernia without obstruction or gangrene, recurrence not specified K40.20   6. 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 Continuous tobacco abuse Z72.0    Get old records if available  RECOMMEND YOU TAKE TRAMADOL 50MG  TAKE 2 TABS AT LEAST DAILY AND THEN TAKE AS NEEDED (NO MORE THAN 6 TABS IN 24 HRS)  Follow up with Rad Onc and Dr Irene Limbo as scheduled  Continue other medications as ordered  Follow up in 3 mos for fasting AWV/EV.     Hannah Crill S. Perlie Gold  [facility] [address] [place], [place] [address] [phone] Cell (Monday-Friday 8 AM - 5 PM) [phone] After 5 PM and follow prompts

## 2018-02-18 NOTE — Patient Instructions (Addendum)
RECOMMEND YOU TAKE TRAMADOL 50MG  TAKE 2 TABS AT LEAST DAILY AND THEN TAKE AS NEEDED (NO MORE THAN 6 TABS IN 24 HRS)  Follow up with Rad Onc and Dr Irene Limbo as scheduled  Continue other medications as ordered  Follow up in 3 mos for fasting AWV/EV.

## 2018-02-19 ENCOUNTER — Telehealth: Payer: Self-pay

## 2018-02-19 ENCOUNTER — Other Ambulatory Visit: Payer: Self-pay | Admitting: Student

## 2018-02-19 DIAGNOSIS — C8598 Non-Hodgkin lymphoma, unspecified, lymph nodes of multiple sites: Secondary | ICD-10-CM

## 2018-02-19 DIAGNOSIS — J90 Pleural effusion, not elsewhere classified: Secondary | ICD-10-CM

## 2018-02-19 NOTE — Telephone Encounter (Signed)
Pt referral made to [facility] [phone]. Order placed in [place]. Pt will need nursing assistance with pleurx drain management and site care. Drain being placed tomorrow. Pt will additionally need bottles from [employer] [place] [phone], but bottles only sold to businesses, not through pt insurance.

## 2018-02-20 ENCOUNTER — Other Ambulatory Visit: Payer: Self-pay | Admitting: Student

## 2018-02-20 ENCOUNTER — Ambulatory Visit (HOSPITAL_COMMUNITY)
Admission: RE | Admit: 2018-02-20 | Discharge: 2018-02-20 | Disposition: A | Discharge status: Home / Self Care | Payer: Medicare Other | Source: Ambulatory Visit | Attending: Hematology | Admitting: Hematology

## 2018-02-20 ENCOUNTER — Other Ambulatory Visit: Payer: Self-pay | Admitting: Hematology

## 2018-02-20 ENCOUNTER — Encounter (HOSPITAL_COMMUNITY): Payer: Self-pay

## 2018-02-20 DIAGNOSIS — J910 Malignant pleural effusion: Secondary | ICD-10-CM | POA: Diagnosis not present

## 2018-02-20 DIAGNOSIS — C8318 Mantle cell lymphoma, lymph nodes of multiple sites: Secondary | ICD-10-CM

## 2018-02-20 DIAGNOSIS — Z8572 Personal history of non-Hodgkin lymphomas: Secondary | ICD-10-CM | POA: Diagnosis not present

## 2018-02-20 HISTORY — PX: IR GUIDED DRAIN W CATHETER PLACEMENT: IMG719

## 2018-02-20 LAB — CBC
HCT: 42.6 % (ref 39.0–52.0)
HEMOGLOBIN: 13 g/dL (ref 13.0–17.0)
MCH: 28.1 pg (ref 26.0–34.0)
MCHC: 30.5 g/dL (ref 30.0–36.0)
MCV: 92.2 fL (ref 78.0–100.0)
PLATELETS: 178 10*3/uL (ref 150–400)
RBC: 4.62 MIL/uL (ref 4.22–5.81)
RDW: 15 % (ref 11.5–15.5)
WBC: 11.2 10*3/uL — AB (ref 4.0–10.5)

## 2018-02-20 LAB — APTT: aPTT: 30 seconds (ref 24–36)

## 2018-02-20 LAB — PROTIME-INR
INR: 1.02
PROTHROMBIN TIME: 13.3 s (ref 11.4–15.2)

## 2018-02-20 MED ORDER — MIDAZOLAM HCL 2 MG/2ML IJ SOLN
INTRAMUSCULAR | Status: AC | PRN
  Administered 2018-02-20 (×2): 1 mg via INTRAVENOUS

## 2018-02-20 MED ORDER — CEFAZOLIN SODIUM-DEXTROSE 2-4 GM/100ML-% IV SOLN
2.0000 g | INTRAVENOUS | Status: AC
  Administered 2018-02-20: 2 g via INTRAVENOUS

## 2018-02-20 MED ORDER — CEFAZOLIN SODIUM-DEXTROSE 2-4 GM/100ML-% IV SOLN
INTRAVENOUS | Status: AC
  Administered 2018-02-20: 2 g via INTRAVENOUS
  Filled 2018-02-20: qty 100

## 2018-02-20 MED ORDER — MIDAZOLAM HCL 2 MG/2ML IJ SOLN
INTRAMUSCULAR | Status: AC
  Filled 2018-02-20: qty 4

## 2018-02-20 MED ORDER — HEPARIN SOD (PORK) LOCK FLUSH 100 UNIT/ML IV SOLN
500.0000 [IU] | INTRAVENOUS | Status: AC | PRN
  Administered 2018-02-20: 500 [IU]
  Filled 2018-02-20: qty 5

## 2018-02-20 MED ORDER — FENTANYL CITRATE (PF) 100 MCG/2ML IJ SOLN
INTRAMUSCULAR | Status: AC | PRN
  Administered 2018-02-20 (×2): 50 ug via INTRAVENOUS

## 2018-02-20 MED ORDER — LIDOCAINE HCL (PF) 1 % IJ SOLN
INTRAMUSCULAR | Status: AC | PRN
  Administered 2018-02-20: 15 mL

## 2018-02-20 MED ORDER — FENTANYL CITRATE (PF) 100 MCG/2ML IJ SOLN
INTRAMUSCULAR | Status: AC
  Filled 2018-02-20: qty 2

## 2018-02-20 MED ORDER — LIDOCAINE HCL (PF) 1 % IJ SOLN
INTRAMUSCULAR | Status: AC
  Filled 2018-02-20: qty 30

## 2018-02-20 MED ORDER — SODIUM CHLORIDE 0.9 % IV SOLN
INTRAVENOUS | Status: DC
Start: 2018-02-20 — End: 2018-02-21
  Administered 2018-02-20: 13:00:00 via INTRAVENOUS

## 2018-02-20 NOTE — Discharge Instructions (Signed)
Indwelling Pleural Catheter Home Guide °An indwelling pleural catheter is a thin, flexible tube that is inserted under your skin and into your chest. The catheter drains excess fluid that collects in the area between the chest wall and the lungs (pleural space).After the catheter is inserted, it can be attached to a bottle that collects fluid. °The pleural catheter will allow you to drain fluid from your chest at home on a regular basis (sometimes daily). This will eliminate the need for frequent visits to the hospital or clinic to drain the fluid. The catheter may be removed after the excess fluid problem is resolved, usually after 2-3 months. It is important to follow instructions from your health care provider about how to drain and care for your catheter. °What are the risks? °Generally, this is a safe procedure. However, problems may occur, including: °· Infection. °· Skin damage around the catheter. °· Lung damage. °· Failure of the chest tube to work properly. °· Spreading of cancer cells along the catheter, if you have cancer. ° °Supplies needed: °· Vacuum-sealed drainage bottle with attached drainage line. °· Sterile dressing. °· Sterile alcohol pads. °· Sterile gloves. °· Valve cap. °· Sterile gauze pads, 4 × 4 inch (10 cm × 10 cm). °· Tape. °· Adhesive dressing. °· Sterile foam catheter pad. °How to care for your catheter and insertion site °· Wash your hands with soap and warm water before and after touching the catheter or insertion site. If soap and water are not available, use hand sanitizer. °· Check your bandage (dressing) daily to make sure it is clean and dry. °· Keep the skin around the catheter clean and dry. °· Check the catheter regularly for any cracks or kinks in the tubing. °· Check your catheter insertion site every day for signs of infection. Check for: °? Skin breakdown. °? Redness, swelling, or pain. °? Fluid or blood. °? Warmth. °? Pus or a bad smell. °How to drain your catheter °You  may need to drain your catheter every day, or more or less often as told by your health care provider. Follow instructions from your health care provider about how to drain your catheter. You may also refer to instructions that come with the drainage system. To drain the catheter: °1. Wash your hands with soap and warm water. If soap and water are not available, use hand sanitizer. °2. Carefully remove the dressing from around the catheter. °3. Wash your hands again. °4. Put on the gloves provided. °5. Prepare the vacuum-sealed drainage bottle and drainage line. Close the drainage line of the vacuum-sealed drainage bottle by squeezing the pinch clamp or rolling the wheel of the roller clamp toward the bottle. The vacuum in the bottle will be lost if the line is not closed completely. °6. Remove the access tip cover from the drainage line. Do not touch the end. Set it on a sterile surface. °7. Remove the catheter valve cap and throw it away. °8. Use an alcohol pad to clean the end of the catheter. °9. Insert the access tip into the catheter valve. Make sure the valve and access tip are securely connected. Listen for a click to confirm that they are connected. °10. Insert the T plunger to break the vacuum seal on the drainage bottle. °11. Open the clamp on the drainage line. °12. Allow the catheter to drain. Keep the catheter and the drainage bottle below the level of your chest. There may be a one-way valve on the end of the   tubing that will allow liquid and air to flow out of the catheter without letting air inside. °13. Drain the amount of fluid as told by your health care provider. It usually takes 5-15 minutes. Do not drain more than 1000 mL of fluid. You may feel a little discomfort while you are draining. If the pain is severe, stop draining and contact your health care provider. °14. After you finish draining the catheter, remove the drainage bottle tubing from the catheter. °15. Use a clean alcohol pad to  wipe the catheter tip. °16. Place a clean cap on the end of the catheter. °17. Use an alcohol pad to clean the skin around the catheter. °18. Allow the skin to air-dry. °19. Put the catheter pad on your skin. Curl the catheter into loops and place it on the pad. Do not place the catheter on your skin. °20. Replace the dressing over the catheter. °21. Discard the drainage bottle as instructed by your health care provider. Do not reuse the drainage bottle. ° °How to change your dressing °Change your dressing at least once a week, or more often if needed to keep the dressing dry. Be sure to change the dressing whenever it becomes moist. Your health care provider will tell you how often to change your dressing. °1. Wash your hands with soap and warm water. If soap and water are not available, use hand sanitizer. °2. Gently remove the old dressing. Avoid using scissors to remove the dressing. Sharp objects may damage the catheter. °3. Wash the skin around the insertion site with mild, fragrance-free soap and warm water. Rinse well, then pat the area dry with a clean cloth. °4. Check the skin around the catheter for signs of infection. Check for: °? Skin breakdown. °? Redness, swelling, or pain. °? Fluid or blood. °? Warmth. °? Pus or a bad smell. °5. If your catheter was stitched (sutured) to your skin, look at the suture to make sure it is still anchored in your skin. °6. Do not apply creams, ointments, or alcohol to the area. Let your skin air-dry completely before you apply a new dressing. °7. Curl the catheter into loops and place it on the sterile catheter pad. Do not place the catheter on your skin. °8. If you do not have a pad, use a clean dressing. Slide the dressing under the disk that holds the drainage catheter in place. °9. Use gauze to cover the catheter and the catheter pad. The catheter should rest on the pad or dressing, not on your skin. °10. Tape the dressing to your skin. You may be instructed to use  an adhesive dressing covering instead of gauze and tape. °11. Wash your hands with soap and warm water. If soap and water are not available, use hand sanitizer. ° °General recommendations °· Always wash your hands with soap and warm water before and after caring for your catheter and drainage bottle. Use a mild, fragrance-free soap. If soap and water are not available, use hand sanitizer. °· Always make sure there are no leaks in the catheter or drainage bottle. °· Each time you drain the catheter, note the color and amount of fluid. °· Do not touch the tip of the catheter or the drainage bottle tubing. °· Do not reuse drainage bottles. °· Do not take baths, swim, or use a hot tub until your health care provider approves. Ask your health care provider if you may take showers. You may only be allowed to take sponge baths. °·   Take deep breaths regularly, followed by a cough. Doing this can help to prevent lung infection. °Contact a health care provider if: °· You have any questions about caring for your catheter or drainage bottle. °· You still have pain at the catheter insertion site more than 2 days after your procedure. °· You have pain while draining your catheter. °· Your catheter becomes bent, twisted, or cracked. °· The connection between the catheter and the collection bottle becomes loose. °· You have any of these around your catheter insertion site or coming from it: °? Skin breakdown. °? Redness, swelling, or pain. °? Fluid or blood. °? Warmth. °? Pus or a bad smell. °Get help right away if: °· You have a fever or chills. °· You have chest pain. °· You have dizziness or shortness of breath. °· You have severe redness, swelling, or pain at your catheter insertion site. °· The catheter comes out. °· The catheter is blocked or clogged. °Summary °· An indwelling pleural catheter is a thin, flexible tube that is inserted under your skin and into your chest. The catheter drains excess fluid that collects in the  area between the chest wall and the lungs (pleural space). °· It is important to follow instructions from your health care provider about how to drain and care for your catheter. °· Do not touch the tip of the catheter or the drainage bottle tubing. °· Always wash your hands with soap and water before and after caring for your catheter and drainage bottle. If soap and water are not available, use hand sanitizer. °This information is not intended to replace advice given to you by your health care provider. Make sure you discuss any questions you have with your health care provider. °Document Released: 01/17/2017 Document Revised: 01/17/2017 Document Reviewed: 01/17/2017 °[employer] Interactive Patient Education © 2018 [employer] °Moderate Conscious Sedation, Adult, Care After °These instructions provide you with information about caring for yourself after your procedure. Your health care provider may also give you more specific instructions. Your treatment has been planned according to current medical practices, but problems sometimes occur. Call your health care provider if you have any problems or questions after your procedure. °What can I expect after the procedure? °After your procedure, it is common: °· To feel sleepy for several hours. °· To feel clumsy and have poor balance for several hours. °· To have poor judgment for several hours. °· To vomit if you eat too soon. ° °Follow these instructions at home: °For at least 24 hours after the procedure: ° °· Do not: °? Participate in activities where you could fall or become injured. °? Drive. °? Use heavy machinery. °? Drink alcohol. °? Take sleeping pills or medicines that cause drowsiness. °? Make important decisions or sign legal documents. °? Take care of children on your own. °· Rest. °Eating and drinking °· Follow the diet recommended by your health care provider. °· If you vomit: °? Drink water, juice, or soup when you can drink without vomiting. °? Make  sure you have little or no nausea before eating solid foods. °General instructions °· Have a responsible adult stay with you until you are awake and alert. °· Take over-the-counter and prescription medicines only as told by your health care provider. °· If you smoke, do not smoke without supervision. °· Keep all follow-up visits as told by your health care provider. This is important. °Contact a health care provider if: °· You keep feeling nauseous or you keep vomiting. °·   You feel light-headed. °· You develop a rash. °· You have a fever. °Get help right away if: °· You have trouble breathing. °This information is not intended to replace advice given to you by your health care provider. Make sure you discuss any questions you have with your health care provider. °Document Released: 07/15/2013 Document Revised: 02/27/2016 Document Reviewed: 01/14/2016 °[employer] Interactive Patient Education © 2018 [employer] ° °

## 2018-02-20 NOTE — Progress Notes (Signed)
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 Patient chooses not to pursue AutoHSCT based strategies which is quite reasonable considering his functional challenges with legal blindness and limited social support.  S/p 6 cycles of Bendamustine/Rituxan   Ibrutinib 560 mg daily   HISTORY OF PRESENTING ILLNESS: plz see clinic note for details on initial presentation  Interval History:   Kristopher Johnson presents today for follow up for his mantle cell lymphoma. The patient's last visit with [place] was on 02/06/18. He is accompanied today by his daughter. He notes that he felt better following his recent left pleurx catheter placement. He notes that the steroid prescription aided in alleviating his pain at that time. He notes that with the tramadol, it aids in him getting sleep, which he is happy for.   Labs today (02/21/18) of CBC, CMP, Reticulocytes, Uric acid, phosphorus, and LDH is as follows: all values are WNL except for CBC with WBC at 11.5. CMP with total protein at 5.6, albumin at 2.8. LDH decreased to 472.   On review of systems, he reports abdominal cramping to the lower region, lack of appetite, nausea, decreased bowel movements due to lack of appetite. he denies hot flashes, night sweats, and any other symptoms. Pertinent positives are listed and detailed within the above HPI.  PAST MEDICAL HISTORY:  Past Medical History:  Diagnosis Date  . Abscess of arm, right 10/27/2015  . Lymphoma of lymph nodes of multiple sites ([place]) 10/27/2015  .  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 Shortness of breath 10/27/2015    SURGICAL HISTORY: Past Surgical History:  Procedure Laterality Date  . Cataract surgery Bilateral    Patient notes she has had bilateral Surgery in 1998 and 99  . FLEXIBLE SIGMOIDOSCOPY N/A 03/01/2017   Procedure: FLEXIBLE SIGMOIDOSCOPY;  Surgeon: Manus Gunning, MD;  Location: [facility];  Service: Gastroenterology;  Laterality: N/A;  . IR GUIDED [place]  02/20/2018  . TONSILLECTOMY     about 62years old    SOCIAL HISTORY: Social History   Socioeconomic History  . Marital status: Widowed    Spouse name: Not on file  . Number of children: Not on file  . Years of education: Not on file  . Highest education level: Not on file  Occupational History  . Not on file  Social Needs  . Financial resource strain: Not on file  . Food insecurity:    Worry: Not on file    Inability: Not on file  . Transportation needs:    Medical: Not on file    Non-medical: Not on file  Tobacco Use  . Smoking status: Current Every Day Smoker    Packs/day: 1.00    Years: 33.00    Pack years: 33.00    Types: Cigarettes  . Smokeless tobacco: Never Used  Substance and Sexual Activity  . Alcohol use: No  . Drug use: No  . Sexual activity: Not on file    Comment:  Disabled, retinis pigmentosa legally blind, 4children MPOA Lynnn(cousin)  Lifestyle  . Physical activity:    Days per week: Not on file    Minutes per session: Not on file  . Stress: Not on file  Relationships  . Social connections:    Talks on phone: Not on file    Gets together: Not on file    Attends religious service: Not on file    Active member of club or organization: Not on file    Attends meetings of clubs or organizations: Not on file    Relationship status: Not on file  . Intimate partner violence:    Fear of current or ex partner:  Not on file    Emotionally abused: Not on file    Physically abused: Not on file    Forced sexual activity: Not on file  Other Topics Concern  . Not on file  Social History Narrative  . Not on file    FAMILY HISTORY: Family History  Problem Relation Age of Onset  . Colon cancer Neg Hx     ALLERGIES:  has No Known Allergies.  MEDICATIONS:  Current Outpatient Medications  Medication Sig Dispense Refill  . Ibrutinib 560 MG TABS Take 560 mg by mouth daily. Take with a full glass of water at approximately the same time daily, maintain hydration. 28 tablet 3  . traMADol (ULTRAM) 50 MG tablet Take 2 tablets (100 mg total) by mouth every 4 (four) hours as needed for moderate pain or severe pain. 90 tablet 0  . Venetoclax 10 & 50 & 100 MG TBPK Take 20 mg by mouth daily. WPYK9: Take 50mg  daily x 7 days. XIPJ8: Take 100mg  daily x 7 days. SNKN3: Take 200mg  daily x 7 days. 42 each 0  . allopurinol (ZYLOPRIM) 300 MG tablet Take 1 tablet (300 mg total) by mouth daily. (Patient not taking: Reported on 02/18/2018) 30 tablet 0  . pantoprazole (PROTONIX) 40 MG tablet Take 1 tablet (40 mg total) by mouth daily. (Patient not taking: Reported on 02/06/2018) 7 tablet 0  . senna-docusate (SENNA S) 8.6-50 MG tablet Take 2 tablets by mouth at bedtime. (Patient not taking: Reported on 02/18/2018) 60 tablet 1   No current facility-administered medications for this visit.     REVIEW OF SYSTEMS:   A 10+ POINT REVIEW OF SYSTEMS WAS OBTAINED including neurology, dermatology, psychiatry, cardiac, respiratory, lymph, extremities, GI, GU, Musculoskeletal, constitutional, breasts, reproductive, HEENT.  All pertinent positives are noted in the HPI.  All others are negative.   PHYSICAL EXAMINATION:  ECOG PERFORMANCE STATUS: 2 - Symptomatic, <50% confined to bed  Vitals:   02/21/18 1105  BP: 111/73  Pulse: (!) 108  Resp: 20  Temp: 97.8 F (36.6 C)  TempSrc: Oral  SpO2: 97%  Weight: 184 lb (83.5 kg)  Height: 5'  9" (1.753 m)   GENERAL:alert, in no acute distress and comfortable SKIN: no acute rashes, no significant lesions EYES: conjunctiva are pink and non-injected, sclera anicteric OROPHARYNX: MMM, no exudates, no oropharyngeal erythema or ulceration NECK: supple, no JVD LYMPH:  no palpable lymphadenopathy in the cervical, axillary or inguinal regions LUNGS: Indwelling left pleurx catheter with decreased air entry halfway up the left posterior chest wall. No rales or rhonchi.  HEART: regular rate & rhythm ABDOMEN:  normoactive bowel sounds , non tender, not distended. Extremity: no pedal edema PSYCH: alert & oriented x 3 with fluent speech NEURO: no focal motor/sensory deficits    LABORATORY DATA:  I have reviewed  the data as listed   CBC Latest Ref Rng & Units 02/21/2018 02/20/2018 02/18/2018  WBC 4.0 - 10.3 K/uL 11.5(H) 11.2(H) 10.8(H)  Hemoglobin 13.0 - 17.1 g/dL 13.7 13.0 14.3  Hematocrit 38.4 - 49.9 % 43.8 42.6 45.2  Platelets 140 - 400 K/uL 157 178 155  HGB 14  . CMP Latest Ref Rng & Units 02/21/2018 02/18/2018 02/17/2018  Glucose 70 - 140 mg/dL 152(H) 188(H) 213(H)  BUN 7 - 26 mg/dL 13 13 13   Creatinine 0.70 - 1.30 mg/dL 0.94 0.96 0.79  Sodium 136 - 145 mmol/L 140 138 138  Potassium 3.5 - 5.1 mmol/L 3.9 3.9 4.1  Chloride 98 - 109 mmol/L 102 102 104  CO2 22 - 29 mmol/L 27 26 26   Calcium 8.4 - 10.4 mg/dL 9.1 9.3 8.6  Total Protein 6.4 - 8.3 g/dL 5.6(L) 5.9(L) 5.2(L)  Total Bilirubin 0.2 - 1.2 mg/dL 0.6 0.5 0.3  Alkaline Phos 40 - 150 U/L 104 91 84  AST 5 - 34 U/L 13 13 18   ALT 0 - 55 U/L 22 23 21    . Lab Results  Component Value Date   LDH 472 (H) 02/21/2018    Lab Results  Component Value Date   LDH 472 (H) 02/21/2018      RADIOGRAPHIC STUDIES: I have personally reviewed the radiological images as listed and agreed with the findings in the report. Dg Chest 1 View  Result Date: 02/14/2018 CLINICAL DATA:  Status post LEFT thoracentesis today. EXAM: CHEST  1 VIEW  COMPARISON:  02/06/2018 and prior studies FINDINGS: Decreased size of loculated LEFT pleural effusion noted. There is no evidence of pneumothorax. LEFT LOWER lung atelectasis/opacity again noted. A RIGHT Port-A-Cath with tip overlying the LOWER SVC again noted. The RIGHT lung is clear. IMPRESSION: Decreased loculated LEFT pleural effusion. No evidence of pneumothorax. Electronically Signed   By: Margarette Canada M.D.   On: 02/14/2018 12:55   Dg Chest 1 View  Result Date: 02/04/2018 CLINICAL DATA:  Status post thoracentesis EXAM: CHEST  1 VIEW COMPARISON:  February 03, 2018 FINDINGS: No pneumothorax. There is moderate loculated effusion on the left currently with patchy consolidation in the left base. Right lung is clear. Heart size and pulmonary vascularity are normal. Port-A-Cath tip is in the superior vena cava. No adenopathy. No bone lesions. IMPRESSION: Moderate residual loculated effusion after thoracentesis. No pneumothorax. There is patchy consolidation in the left base as well. Lungs elsewhere clear. Heart size normal. Electronically Signed   By: Lowella Grip III M.D.   On: 02/04/2018 10:46   Dg Chest 1 View  Result Date: 01/29/2018 CLINICAL DATA:  Post left thoracentesis EXAM: CHEST  1 VIEW COMPARISON:  01/28/2018 FINDINGS: Small to moderate residual left effusion following thoracentesis. No pneumothorax. Port-A-Cath is in place, unchanged. Heart is upper limits normal in size. Left base atelectasis or infiltrate, improved since prior study. IMPRESSION: Improving left pleural effusion and left base atelectasis or infiltrate. No pneumothorax. Electronically Signed   By: Rolm Baptise M.D.   On: 01/29/2018 13:24   Dg Chest 1 View  Result Date: 01/27/2018 CLINICAL DATA:  62 year old male status post left thoracentesis EXAM: CHEST  1 VIEW COMPARISON:  01/27/2018 FINDINGS: Cardiomediastinal silhouette likely unchanged with the left heart border partially obscured by residual opacity at the left lung  base. Compared to the prior there is improved aeration with decreased opacity on the left. No pneumothorax. Right lung relatively well aerated. Port catheter on the right chest wall, unchanged. No displaced fracture.  IMPRESSION: No complicating features status post left-sided thoracentesis. Residual opacity at the left base, likely a combination of residual fluid, atelectasis/consolidation, and soft tissue in this patient with known lymphoma. Electronically Signed   By: Corrie Mckusick D.O.   On: 01/27/2018 17:42   Dg Chest 2 View  Result Date: 02/06/2018 CLINICAL DATA:  Status post left-sided thoracentesis last week. The patient is were reporting worsening left-sided chest pain and shortness of breath. EXAM: CHEST - 2 VIEW COMPARISON:  PET-CT study of Feb 05, 2018 and chest x-ray of February 04, 2018 FINDINGS: There has been reaccumulation of pleural fluid on the left. It occupies approximately 1/2 of the pleural space volume. There is no significant mediastinal shift. The right lung is clear. The heart and pulmonary vascularity are normal. The power port catheter tip projects over the midportion of the SVC. The bony thorax exhibits no acute abnormality. IMPRESSION: Interval re-accumulation of the left pleural effusion over the past 2 days. No pneumothorax or mediastinal shift. No CHF. Electronically Signed   By: David  Martinique M.D.   On: 02/06/2018 11:18   Dg Chest 2 View  Result Date: 02/03/2018 CLINICAL DATA:  Shortness of breath. EXAM: CHEST - 2 VIEW COMPARISON:  Radiograph of January 29, 2018. FINDINGS: Mild cardiomegaly is noted. Right lung is clear. Right internal jugular Port-A-Cath is noted with distal tip in expected position of SVC. Large left pleural effusion is noted with underlying atelectasis or infiltrate. Bony thorax is unremarkable. IMPRESSION: Large left pleural effusion is noted with underlying atelectasis or infiltrate. Electronically Signed   By: Marijo Conception, M.D.   On: 02/03/2018 11:44    Dg Chest 2 View  Result Date: 01/27/2018 CLINICAL DATA:  Worsening shortness of breath for 4 days. Mantle cell lymphoma. EXAM: CHEST - 2 VIEW COMPARISON:  11/28/2017 FINDINGS: Large left pleural effusion has increased in size since previous study, with compressive atelectasis of the left lung. Right lung remains clear. Heart size appears stable. Right-sided power port remains in appropriate position. IMPRESSION: Increased size of large left pleural effusion and compressive atelectasis. Electronically Signed   By: Earle Gell M.D.   On: 01/27/2018 12:15   Ct Angio Chest Pe W Or Wo Contrast  Result Date: 01/28/2018 CLINICAL DATA:  Five-day history of shortness of breath. History of non-Hodgkin's lymphoma. EXAM: CT ANGIOGRAPHY CHEST WITH CONTRAST TECHNIQUE: Multidetector CT imaging of the chest was performed using the standard protocol during bolus administration of intravenous contrast. Multiplanar CT image reconstructions and MIPs were obtained to evaluate the vascular anatomy. CONTRAST:  152mL ISOVUE-370 IOPAMIDOL (ISOVUE-370) INJECTION 76% COMPARISON:  PET-CT 10/25/2017 FINDINGS: Cardiovascular: The heart is normal in size. No pericardial effusion. The aorta is normal in caliber. No dissection. The branch vessels are patent. The pulmonary arterial tree is fairly well opacified. Breathing motion artifact limits examination but no definite filling defects to suggest pulmonary embolism. Mediastinum/Nodes: Much improved right axillary and subpectoral adenopathy. The mediastinal and hilar adenopathy is also much improved. 10 mm precarinal lymph node on image number 42 previously measured 16 mm. The 22 mm subcarinal nodal mass seen on the prior study has resolved. Lungs/Pleura: Large pleural lesions have significantly improved since the prior examination. The pleural lesion or internal mammary adenopathy on the left side measures 25 mm and previously measured 37 mm. The large pleural mass on the left side  slightly more inferiorly has completely resolved. The right internal mammary adenopathy has largely resolved. The pleural mass at the left lung base laterally on  image number 74 appears relatively stable. Persistent moderate-sized left pleural effusion with associated enhancing pleural nodularity posteriorly appears somewhat improved. Patchy airspace disease in the left upper lobe and lingula, likely pneumonia. This may account for the patient's shortness of breath. The right lung remains clear.  No right-sided pleural effusion. Upper Abdomen: Stable left hepatic lobe cyst. Stable upper abdominal lymphadenopathy. Musculoskeletal: No significant bony findings. Stable small scattered sclerotic bone lesions, likely benign bone islands. Review of the MIP images confirms the above findings. IMPRESSION: 1. No definite CT findings for pulmonary embolism. Exam is somewhat limited by breathing motion artifact. 2. Left upper lobe and lingula pneumonia. 3. Overall improved lymphadenopathy and left-sided pleural disease. Persistent left effusion. Electronically Signed   By: Marijo Sanes M.D.   On: 01/28/2018 10:33   Ir Guided Niel Hummer W Catheter Placement  Result Date: 02/21/2018 INDICATION: 62 year old male with a history of recurrent malignant left-sided pleural effusion. EXAM: IMAGE GUIDED TUNNELED PLEURAL CATHETER MEDICATIONS: 2.0 g Ancef. The antibiotics were administered within an appropriate time frame prior to the initiation of the procedure. ANESTHESIA/SEDATION: Fentanyl 100 mcg IV; Versed 2.0 mg IV Moderate Sedation Time:  13 minutes The patient was continuously monitored during the procedure by the interventional radiology nurse under my direct supervision. COMPLICATIONS: None PROCEDURE: The procedure, risks, benefits, and alternatives were explained to the patient and the patient's family. Specific risks that were addressed included bleeding, infection, pneumothorax, need for further procedure, chance of delayed  pneumothorax or hemorrhage, hemoptysis, cardiopulmonary collapse, death. Questions regarding the procedure were encouraged and answered. The patient understands and consents to the procedure. The left chest wall was prepped with Betadine in a sterile fashion, and a sterile drape was applied covering the operative field. A sterile gown and sterile gloves were used for the procedure. Local anesthesia was provided with 1% Lidocaine. Ultrasound image documentation was performed. After creating a small skin incision, a 19 gauge needle was advanced into the pleural cavity under ultrasound guidance. A guide wire was then advanced under fluoroscopy into the pleural space. Pleural access was dilated serially and a 16-French peel-away sheath placed. The skin and subcutaneous tissues were generously infiltrated with 1% lidocaine from the puncture site over the pleura along the intercostal margin anteriorly. A small stab incision was made with 11 blade scalpel at the insertion site of the catheter, and the catheter was back tunneled to the site at the pleural puncture. A tunneled CareFusion Pleurex catheter was placed. This was tunneled from the incision 5 cm anterior to the pleural access to the access site. The catheter was advanced through the peel-away sheath. The sheath was then removed. Final catheter positioning was confirmed with a fluoroscopic spot image. The access incision was closed with Dermabond, applied to the catheterization incision. Large volume thoracentesis was performed through the new catheter utilizing gravity drainage bag. The patient tolerated the procedure well and remained hemodynamically stable throughout. No complications were encountered and no significant blood loss was encountered. IMPRESSION: Status post left-sided tunneled pleural catheter. Signed, Dulcy Fanny. Earleen Newport, DO [facility] Electronically Signed   By: Corrie Mckusick D.O.   On:  02/21/2018 08:45   Nm Pet Image Restag (ps) Skull Base To Thigh  Result Date: 02/05/2018 CLINICAL DATA:  Subsequent treatment strategy for mantle cell lymphoma, stage IV. EXAM: NUCLEAR MEDICINE PET SKULL BASE TO THIGH TECHNIQUE: 10.2 mCi F-18 FDG was injected intravenously. Full-ring PET imaging was performed from the skull base to thigh after the radiotracer.  CT data was obtained and used for attenuation correction and anatomic localization. Fasting blood glucose: 103 mg/dl COMPARISON:  PET-CT 10/25/2017, 9 /12/18 FINDINGS: Mediastinal blood pool activity: SUV max 2.4 NECK: No hypermetabolic lymph nodes in the neck. Incidental CT findings: none CHEST: There is decreased thickening of the masslike pleural metastasis in the LEFT hemithorax however there is increase in confluence of the pleural involvement. The near entirety of the LEFT hemithorax pleural surface is now involved by the hypermetabolic malignancy with SUV max equal 10.0. Again individual mass lesion decreased in size. For example 3 cm thick lesion in the anterior aspect of the LEFT upper lobe is decreased to 1 cm. Some lesions remain unchanged. For example mass lesion at the level of the LEFT internal mammary nodes measures 3.4 cm compared to 3.6 cm with SUV max equal 15 not changed. Large loculated LEFT effusion remains. There are intensely hypermetabolic mediastinal lymph nodes and internal mammary lymph nodes on the RIGHT unchanged. Interval resolution of hypermetabolic lymph node in the RIGHT axilla. Resolution of the pleural metastasis in the RIGHT hemithorax. Incidental CT findings: Large loculated effusions in the LEFT hemithorax ABDOMEN/PELVIS: The dominant finding is interval increase in the size the spleen as well as new metabolic activity. Spleen measures 10.8 x 6.8 cm in axial dimension with SUV max equal 10.9 compared to 5.0 by 3.4 cm with no significant metabolic activity background on comparison exam. Conversely, the large mass lesion  associated with the rectum is near completely resolved. Lymph nodes in the retroperitoneum have near completely resolved. Example lesion anterior to the RIGHT psoas and IVC previously measured 3.2 cm is no longer measurable. Additional cutaneous nodules have improved and resolved in the pelvis. Example previous nodule in the soft tissues and muscles anterior to the LEFT femoral head has resolved. Incidental CT findings: none SKELETON: No focal hypermetabolic activity to suggest skeletal metastasis. Incidental CT findings: none IMPRESSION: 1. Mixed response to therapy. 2. Decrease in size pleural metastasis in the RIGHT and LEFT lungs. Increase in confluent hypermetabolic pleural lesions LEFT hemithorax. 3. Persistent large loculated LEFT pleural effusions. 4. Persistent hypermetabolic mediastinal lymph nodes and masses 5. 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 6. Marked improvement in mass lesion previous associated with rectum. 7. Additional improvement in lymph nodes in the retroperitoneum and subcutaneous nodes. Electronically Signed   By: Suzy Bouchard M.D.   On: 02/05/2018 17:36   Dg Chest Port 1 View  Result Date: 01/28/2018 CLINICAL DATA:  Status post left-sided thoracentesis yesterday. History of lymphoma. EXAM: PORTABLE CHEST 1 VIEW COMPARISON:  Postprocedure chest x-ray of January 27, 2018 FINDINGS: The right lung is well-expanded and clear. On the left there is increased density in the mid to lower lung. The left pleural effusion is smaller but atelectasis or infiltrate is more apparent in the lower 1/2 of the left hemithorax today. The heart is not enlarged. The pulmonary vascularity remains prominent centrally. The power port catheter tip projects over the midportion of the SVC. IMPRESSION: Interval increase conspicuity of atelectasis or pneumonia at the left lung base. Persistent small left pleural effusion. Electronically  Signed   By: David  Martinique M.D.   On: 01/28/2018 12:02   US Thoracentesis Asp Pleural Space W/img Guide  Result Date: 02/14/2018 INDICATION: Patient with history of mantle cell lymphoma with recurrent symptomatic malignant left pleural effusion. Request made for therapeutic left thoracentesis. EXAM: ULTRASOUND GUIDED THERAPEUTIC LEFT THORACENTESIS MEDICATIONS: None COMPLICATIONS: None immediate.  PROCEDURE: An ultrasound guided thoracentesis was thoroughly discussed with the patient and questions answered. The benefits, risks, alternatives and complications were also discussed. The patient understands and wishes to proceed with the procedure. Written consent was obtained. Ultrasound was performed to localize and mark an adequate pocket of fluid in the left chest. The area was then prepped and draped in the normal sterile fashion. 1% Lidocaine was used for local anesthesia. Under ultrasound guidance a 6 Fr Safe-T-Centesis catheter was introduced. Thoracentesis was performed. The catheter was removed and a dressing applied. FINDINGS: A total of approximately 2.4 liters of blood-tinged fluid was removed. IMPRESSION: Successful ultrasound guided therapeutic left thoracentesis yielding 2.4 liters of pleural fluid. Follow-up chest x-ray revealed no pneumothorax. Read by: Rowe Nimai, PA-C Electronically Signed   By: Jacqulynn Cadet M.D.   On: 02/14/2018 13:24   US Thoracentesis Asp Pleural Space W/img Guide  Result Date: 02/04/2018 INDICATION: Patient with history of mantle cell lymphoma and recurrent symptomatic malignant left pleural effusion. Request made for therapeutic left thoracentesis. EXAM: ULTRASOUND GUIDED THERAPEUTIC LEFT THORACENTESIS MEDICATIONS: None COMPLICATIONS: None immediate. PROCEDURE: An ultrasound guided thoracentesis was thoroughly discussed with the patient and questions answered. The benefits, risks, alternatives and complications were also discussed. The patient understands and wishes  to proceed with the procedure. Written consent was obtained. Ultrasound was performed to localize and mark an adequate pocket of fluid in the left chest. The area was then prepped and draped in the normal sterile fashion. 1% Lidocaine was used for local anesthesia. Under ultrasound guidance a 6 Fr Safe-T-Centesis catheter was introduced. Thoracentesis was performed. The catheter was removed and a dressing applied. FINDINGS: A total of approximately 2.2 liters of blood-tinged fluid was removed. Due to patient coughing only the above amount of fluid was removed today. IMPRESSION: Successful ultrasound guided therapeutic left thoracentesis yielding 2.2 liters of pleural fluid. Follow-up chest x-ray revealed no pneumothorax. Read by: Rowe Zahid, PA-C Electronically Signed   By: Markus Daft M.D.   On: 02/04/2018 11:34   US Thoracentesis Asp Pleural Space W/img Guide  Result Date: 01/29/2018 INDICATION: Mantle cell lymphoma. Recurrent malignant pleural effusion. Request for therapeutic thoracentesis. EXAM: ULTRASOUND GUIDED LEFT THORACENTESIS MEDICATIONS: 1% Lidocaine = 10 mL COMPLICATIONS: None immediate. PROCEDURE: An ultrasound guided thoracentesis was thoroughly discussed with the patient and questions answered. The benefits, risks, alternatives and complications were also discussed. The patient understands and wishes to proceed with the procedure. Written consent was obtained. Ultrasound was performed to localize and mark an adequate pocket of fluid in the left chest. The area was then prepped and draped in the normal sterile fashion. 1% Lidocaine was used for local anesthesia. Under ultrasound guidance a 6 Fr Safe-T-Centesis catheter was introduced. Thoracentesis was performed. The catheter was removed and a dressing applied. FINDINGS: A total of approximately 2.2 liters of clear amber fluid was removed. IMPRESSION: Successful ultrasound guided left thoracentesis yielding 2.2 liters of pleural fluid. No  pneumothorax on post procedure chest X-ray. Read by: Gareth Eagle, PA-C Electronically Signed   By: Lucrezia Europe M.D.   On: 01/29/2018 14:05   US Thoracentesis Asp Pleural Space W/img Guide  Result Date: 01/27/2018 INDICATION: Patient with history of mantle cell lymphoma with recurrent malignant left pleural effusion, dyspnea. Request made for diagnostic and therapeutic left thoracentesis. EXAM: ULTRASOUND GUIDED DIAGNOSTIC AND THERAPEUTIC LEFT THORACENTESIS MEDICATIONS: None COMPLICATIONS: None immediate. PROCEDURE: An ultrasound guided thoracentesis was thoroughly discussed with the patient and questions answered. The benefits, risks, alternatives and complications were also  discussed. The patient understands and wishes to proceed with the procedure. Written consent was obtained. Ultrasound was performed to localize and mark an adequate pocket of fluid in the left chest. The area was then prepped and draped in the normal sterile fashion. 1% Lidocaine was used for local anesthesia. Under ultrasound guidance a 6 Fr Safe-T-Centesis catheter was introduced. Thoracentesis was performed. The catheter was removed and a dressing applied. FINDINGS: A total of approximately 2.8 liters of blood-tinged fluid was removed. Samples were sent to the laboratory as requested by the clinical team. IMPRESSION: Successful ultrasound guided diagnostic and therapeutic left thoracentesis yielding 2.8 liters of pleural fluid. Read by: Rowe Jayvien, PA-C Electronically Signed   By: Corrie Mckusick D.O.   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 ECHO nl EF ECOG PS 2 but activities significantly limited due to being legally blind HIV/Hep C/Hep B neg  PET/CT scan after 3 cycles of R CHOP show good overall response. Stomach lesion still with very FDG uptake ? Residual lymphoma versus inflammation.    Patient is status post 6 cycles of R CHOP. PET/CT after 6 cycles shows resolution of hypermetabolic lymphadenopathy and gastric wall activity . Patient was on maintenance Rituxan for 1 year now prior to relapse. -Labs reviewed today (02/21/18) of CBC, CMP, Reticulocytes, Uric acid, phosphorus, and LDH is as follows: all values are WNL except for CBC with WBC at 11.5. CMP with total protein at 5.6, albumin at 2.8. LDH decreased to 472.  -I discussed that we will monitor his counts and give IVF weekly as well as add on rituaxin.  -F/u as scheduled.   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 CT chest abdomen pelvis showed multiple areas of lymphadenopathy in the abdomen as well as in the chest suggesting significant recurrence. PET/CT scan 03/25/2017 shows diffuse involvement with mantle cell lymphoma involving the chest abdomen pelvis and bowels. 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 Workup showed malignant pleural effusion and b/l pleural involvement and rectal urgency due to mantle cell lymphoma causing mass in rectum/sigmoid -PET/CT 10/25/2017 - with significant mantle cell lymphoma progression again. -He was placed on Ibrutinib 560 mg p.o. Daily in 10/2017 and has resolution of most of his symptoms and normalization of elevated LDH levels. . 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 PET/CT 02/05/2018 with mixed treatment response. S/p pleurx catheter placement. 5) Rapidly developing splenomegaly related to relapsed MCL PLAN:  Discussed pt labwork today, 02/21/18; some thrombocytopenia, no anemia, relatively stable  blood counts -s/p Pleurex catheter to drain his pleural effusions at home. -home care to remove 500cc QOD to help with symptoms. -continue Ibrutinib + Venetoclax escalation -Rituxan q3weeks -monitor for TLS on venetoclax and IVF. -Radiation oncology Input as needed to address left pleural effusion if inadequate  -Will need to see pt more frequently to monitor addition of Venetoclax.  -Will add Percocet for pain control     3) Retinitis pigmentosa causing legal blindness -  is seeing a retina specialist in town. No  acute interventions at this time. Notes that his vision is progressively getting worse. His daughter and granddaughter are helping him out at home.    4) Weight loss and protein calorie malnutrition.  Has gain 6 lbs since last clinic visit -- good po intake  Wt Readings from Last 3 Encounters:  02/21/18 184 lb (83.5 kg)  02/20/18 182 lb (82.6 kg)  02/20/18 182 lb (82.6 kg)   5) h/o Peri-orbital dermatitis /Conjuncitivitis - patient was seen by the dermatologist and given topical desonide ointment which led to the resolution of his dermatitis. There were not sure about the etiology but noted then that could not rule out Rituxan as a possibility. Currently his dermatitis has resolved and his conjunctivitis has resolved. He did not have rash anywhere else at the time. No oral sores or other toxicities. This happened about 4 days after Rituxan another chemotherapy. This has completely resolved and patient has not had an further issues with this with several additional doses of Rituxan.  6) Patient Active Problem List   Diagnosis Date Noted  . Recurrent pleural effusion on left 02/03/2018  . Protein-calorie malnutrition, severe 10/29/2017  . 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 Large pleural effusion 10/25/2017  . Counseling regarding advanced care planning and goals of care 03/13/2017  . Rectal mass 03/02/2017  . Colon cancer metastasized to liver and lung(HCC) 03/02/2017  .  Rectal bleeding 02/28/2017  . Portacath in place 01/30/2016  . Skin lesion of chest wall 12/19/2015  . Shingles 12/19/2015  . Rash 11/28/2015  . 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 Mantle cell lymphoma of lymph nodes of multiple regions ([place]) 11/01/2015  . Protein-calorie malnutrition ([place]) 11/01/2015  . Abscess of arm, right 10/27/2015  . Lymphoma of lymph nodes of multiple sites ([place]) 10/27/2015  . Shortness of breath 10/27/2015  . Shortness of breath dyspnea 10/27/2015  . Hypoalbuminemia 10/25/2015  . Elevated lipase 10/25/2015  . Inguinal hernia 10/25/2015  . Urinary retention 10/25/2015  . Abdominal pain   . Lymphadenopathy 10/24/2015   -continue f/u with PCP for other continued medical issues.  F/u as per currently scheduled appointments  . The total time spent in the appointment was 25 minutes and more than 50% was on counseling and direct patient cares and extensive care co-ordination.    Sullivan Lone MD [place] AAHIVMS [facility] Hematology/Oncology Physician [place]  (Office):       [phone] (Work cell):  [phone] (Fax):           [phone]  This document serves as a record of services personally performed by Sullivan Lone, MD. It was created on his behalf by Steva Colder, a trained medical scribe. The creation of this record is based on the scribe's personal observations and the provider's statements to them.   .I have reviewed the above documentation for accuracy and completeness, and I agree with the above. Brunetta Genera MD MS

## 2018-02-20 NOTE — Progress Notes (Signed)
Left a message for Dr. Grier Mitts nurse regarding Mr. Haggard and follow up care for his newly placed pleurx catheter. He seems to think someone is coming out to help him but is unsure of the details. There is not a note in the chart that talks about this issue. The patient has an appointment with Dr. Irene Limbo tomorrow. Will have the patient follow up with the doctor then. Will send him home with the proper supplies today.

## 2018-02-20 NOTE — H&P (Signed)
Referring Physician(s): Brunetta Genera  Supervising Physician: Corrie Mckusick  Patient Status:  [facility] OP  Chief Complaint:  Recurrent symptomatic malignant left pleural effusion  Subjective: Patient familiar to IR service from prior right inguinal lymph node biopsy in 2017, Port-A-Cath placement 2017, as well as multiple left thoracenteses, latest on 02/14/2018 yielding 2.4 L.  He has a known history of mantle cell lymphoma with recurrent symptomatic malignant left pleural effusion and presents today for left Pleurx catheter placement.  He currently denies fever, headache, vomiting or abnormal bleeding.  He does have intermittent chest discomfort, dizziness, dyspnea, occasional cough, occasional nausea, and diminished appetite. Past Medical History:  Diagnosis Date  . Abscess of arm, right 10/27/2015  . Lymphoma of lymph nodes of multiple sites ([place]) 10/27/2015  . 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 Shortness of breath 10/27/2015   Past Surgical History:  Procedure Laterality Date  . Cataract surgery Bilateral    Patient notes she has had bilateral Surgery in 1998 and 99  . FLEXIBLE SIGMOIDOSCOPY N/A 03/01/2017   Procedure: FLEXIBLE SIGMOIDOSCOPY;  Surgeon: Manus Gunning, MD;  Location: [facility];  Service: Gastroenterology;  Laterality: N/A;  . TONSILLECTOMY     about 62years old     Allergies: Patient has no known allergies.  Medications: Prior to Admission medications   Medication Sig Start Date End Date Taking? Authorizing Provider  Ibrutinib 560 MG TABS Take 560 mg by mouth daily. Take with a full glass of water at approximately the same time daily, maintain hydration. 11/28/17  Yes Brunetta Genera, MD  traMADol (ULTRAM) 50 MG tablet Take 2 tablets (100 mg total) by mouth every 4 (four) hours as needed for moderate pain or severe pain. 02/12/18  Yes Hayden Pedro, PA-C  Venetoclax 10 & 50 & 100 MG TBPK Take 20 mg by mouth daily. LGXQ1: Take 50mg  daily x 7 days. JHER7: Take 100mg  daily x 7 days. EYCX4: Take 200mg  daily x 7 days. 02/14/18  Yes Brunetta Genera, MD  allopurinol (ZYLOPRIM) 300 MG tablet Take 1 tablet (300 mg total) by mouth daily. Patient not taking: Reported on 02/18/2018 02/13/18   Brunetta Genera, MD  pantoprazole (PROTONIX) 40 MG tablet Take 1 tablet (40 mg total) by mouth daily. Patient not taking: Reported on 02/06/2018 01/31/18   Hosie Poisson, MD  senna-docusate (SENNA S) 8.6-50 MG tablet Take 2 tablets by mouth at bedtime. Patient not taking: Reported on 02/18/2018 02/06/18   Brunetta Genera, MD  prochlorperazine (COMPAZINE) 10 MG tablet Take 1 tablet (10 mg total) by mouth every 6 (six) hours as needed (Nausea or vomiting). 03/13/17 03/13/17  Brunetta Genera, MD     Vital Signs: Blood pressure 117/68, heart rate 110, temp 98.4, respirations 18, O2 sat 98% room air   Physical Exam awake, alert.  Chest with diminished breath sounds left base, right clear.  Clean, intact right chest wall Port-A-Cath.  Heart with tachycardic rate, regular rhythm, soft murmur.  Abd soft, positive bowel sounds, tender LUQ/splenomegaly;  No lower extremity edema; some mild cervical lymphadenopathy appreciated  Imaging: No results found.  Labs:  CBC: Recent Labs    02/06/18 0857 02/13/18 0841 02/17/18 0907 02/18/18 0929  WBC 11.9* 13.1* 8.2 10.8*  HGB 15.5 14.2 14.4 14.3  HCT 48.8 44.8 45.0 45.2  PLT 107* 141 159 155    COAGS: Recent Labs  10/25/17 0844 01/27/18 1340 01/28/18 0433  INR 1.02 0.95 1.04  APTT 33  --   --     BMP: Recent Labs    02/13/18 0841 02/17/18 0907 02/17/18 1441 02/18/18 0929  NA 139 140 138 138  K 4.3 3.9 4.1 3.9  CL 103 103 104 102  CO2 29 27 26 26   GLUCOSE 147* 172* 213* 188*  BUN 15 13 13 13   CALCIUM 9.0 9.0 8.6 9.3  CREATININE 0.99 0.89 0.79 0.96  GFRNONAA >60 >60 >60 >60    GFRAA >60 >60 >60 >60    LIVER FUNCTION TESTS: Recent Labs    02/13/18 0841 02/17/18 0907 02/17/18 1441 02/18/18 0929  BILITOT 0.5 0.6 0.3 0.5  AST 12 10 18 13   ALT 21 20 21 23   ALKPHOS 85 85 84 91  PROT 5.5* 5.6* 5.2* 5.9*  ALBUMIN 2.7* 2.6* 2.5* 2.8*    Assessment and Plan: Pt with known history of mantle cell lymphoma with recurrent symptomatic malignant left pleural effusion requiring multiple thoracenteses.  Presents today for left Pleurx catheter placement.  Details/risks of procedure, including but not limited to, internal bleeding, infection, injury to adjacent structures discussed with patient with his understanding and consent.  Labs pending  Electronically Signed: D. Rowe Farhaan, PA-C 02/20/2018, 1:15 PM   I spent a total of 25 minutes at the the patient's bedside AND on the patient's hospital floor or unit, greater than 50% of which was counseling/coordinating care for left Pleurx catheter placement

## 2018-02-20 NOTE — Procedures (Signed)
Interventional Radiology Procedure Note  Procedure: Placement of a left tunneled chest tube, for malignant pleural fluid.  Complications: None Recommendations:  - Ok to shower tomorrow - Do not submerge - Routine care   Signed,  Dulcy Fanny. Earleen Newport, DO

## 2018-02-21 ENCOUNTER — Encounter (HOSPITAL_COMMUNITY): Payer: Self-pay | Admitting: Interventional Radiology

## 2018-02-21 ENCOUNTER — Inpatient Hospital Stay (HOSPITAL_BASED_OUTPATIENT_CLINIC_OR_DEPARTMENT_OTHER): Payer: Medicare Other | Admitting: Hematology

## 2018-02-21 ENCOUNTER — Inpatient Hospital Stay: Payer: Medicare Other

## 2018-02-21 ENCOUNTER — Telehealth: Payer: Self-pay

## 2018-02-21 ENCOUNTER — Ambulatory Visit: Admission: RE | Admit: 2018-02-21 | Payer: Medicare Other | Source: Ambulatory Visit | Admitting: Radiation Oncology

## 2018-02-21 VITALS — BP 111/73 | HR 108 | Temp 97.8°F | Resp 20 | Ht 69.0 in | Wt 184.0 lb

## 2018-02-21 DIAGNOSIS — C8318 Mantle cell lymphoma, lymph nodes of multiple sites: Secondary | ICD-10-CM

## 2018-02-21 DIAGNOSIS — Z79899 Other long term (current) drug therapy: Secondary | ICD-10-CM

## 2018-02-21 DIAGNOSIS — H3552 Pigmentary retinal dystrophy: Secondary | ICD-10-CM | POA: Diagnosis not present

## 2018-02-21 DIAGNOSIS — Z5112 Encounter for antineoplastic immunotherapy: Secondary | ICD-10-CM | POA: Diagnosis not present

## 2018-02-21 DIAGNOSIS — D696 Thrombocytopenia, unspecified: Secondary | ICD-10-CM

## 2018-02-21 DIAGNOSIS — F17210 Nicotine dependence, cigarettes, uncomplicated: Secondary | ICD-10-CM

## 2018-02-21 DIAGNOSIS — C782 Secondary malignant neoplasm of pleura: Secondary | ICD-10-CM

## 2018-02-21 DIAGNOSIS — J910 Malignant pleural effusion: Secondary | ICD-10-CM | POA: Diagnosis not present

## 2018-02-21 DIAGNOSIS — E43 Unspecified severe protein-calorie malnutrition: Secondary | ICD-10-CM | POA: Diagnosis not present

## 2018-02-21 DIAGNOSIS — H548 Legal blindness, as defined in USA: Secondary | ICD-10-CM | POA: Diagnosis not present

## 2018-02-21 DIAGNOSIS — J90 Pleural effusion, not elsewhere classified: Secondary | ICD-10-CM

## 2018-02-21 DIAGNOSIS — G893 Neoplasm related pain (acute) (chronic): Secondary | ICD-10-CM

## 2018-02-21 LAB — CBC WITH DIFFERENTIAL (CANCER CENTER ONLY)
Basophils Absolute: 0 10*3/uL (ref 0.0–0.1)
Basophils Relative: 0 %
EOS PCT: 1 %
Eosinophils Absolute: 0.1 10*3/uL (ref 0.0–0.5)
HCT: 43.8 % (ref 38.4–49.9)
Hemoglobin: 13.7 g/dL (ref 13.0–17.1)
LYMPHS PCT: 8 %
Lymphs Abs: 0.9 10*3/uL (ref 0.9–3.3)
MCH: 28.8 pg (ref 27.2–33.4)
MCHC: 31.3 g/dL — ABNORMAL LOW (ref 32.0–36.0)
MCV: 92 fL (ref 79.3–98.0)
MONO ABS: 0.5 10*3/uL (ref 0.1–0.9)
Monocytes Relative: 4 %
Neutro Abs: 10.1 10*3/uL — ABNORMAL HIGH (ref 1.5–6.5)
Neutrophils Relative %: 87 %
PLATELETS: 157 10*3/uL (ref 140–400)
RBC: 4.76 MIL/uL (ref 4.20–5.82)
RDW: 15.1 % — ABNORMAL HIGH (ref 11.0–14.6)
WBC Count: 11.5 10*3/uL — ABNORMAL HIGH (ref 4.0–10.3)

## 2018-02-21 LAB — CMP (CANCER CENTER ONLY)
ALBUMIN: 2.8 g/dL — AB (ref 3.5–5.0)
ALK PHOS: 104 U/L (ref 40–150)
ALT: 22 U/L (ref 0–55)
AST: 13 U/L (ref 5–34)
Anion gap: 11 (ref 3–11)
BILIRUBIN TOTAL: 0.6 mg/dL (ref 0.2–1.2)
BUN: 13 mg/dL (ref 7–26)
CALCIUM: 9.1 mg/dL (ref 8.4–10.4)
CO2: 27 mmol/L (ref 22–29)
Chloride: 102 mmol/L (ref 98–109)
Creatinine: 0.94 mg/dL (ref 0.70–1.30)
GFR, Est AFR Am: >60 mL/min (ref >60)
GFR, Estimated: >60 mL/min (ref >60)
GLUCOSE: 152 mg/dL — AB (ref 70–140)
Potassium: 3.9 mmol/L (ref 3.5–5.1)
Sodium: 140 mmol/L (ref 136–145)
TOTAL PROTEIN: 5.6 g/dL — AB (ref 6.4–8.3)

## 2018-02-21 LAB — LACTATE DEHYDROGENASE: LDH: 472 U/L — ABNORMAL HIGH (ref 125–245)

## 2018-02-21 LAB — PHOSPHORUS: Phosphorus: 3.2 mg/dL (ref 2.5–4.6)

## 2018-02-21 LAB — RETICULOCYTES
RBC.: 4.76 MIL/uL (ref 4.20–5.82)
Retic Count, Absolute: 76.2 10*3/uL (ref 34.8–93.9)
Retic Ct Pct: 1.6 % (ref 0.8–1.8)

## 2018-02-21 LAB — URIC ACID: Uric Acid, Serum: 5.9 mg/dL (ref 2.6–7.4)

## 2018-02-21 NOTE — Telephone Encounter (Signed)
LVM with pt daughter, Kristopher Johnson, that staff from [facility] are scheduled to come to the house tomorrow to assist with pleurx drain management and teaching.

## 2018-02-24 ENCOUNTER — Other Ambulatory Visit: Payer: Self-pay

## 2018-02-24 ENCOUNTER — Other Ambulatory Visit: Payer: Self-pay | Admitting: Medical

## 2018-02-24 ENCOUNTER — Encounter: Payer: Self-pay | Admitting: Medical

## 2018-02-24 ENCOUNTER — Telehealth: Payer: Self-pay

## 2018-02-24 ENCOUNTER — Inpatient Hospital Stay (HOSPITAL_BASED_OUTPATIENT_CLINIC_OR_DEPARTMENT_OTHER): Payer: Medicare Other | Admitting: Medical

## 2018-02-24 ENCOUNTER — Other Ambulatory Visit: Payer: Medicare Other

## 2018-02-24 ENCOUNTER — Inpatient Hospital Stay: Payer: Medicare Other

## 2018-02-24 ENCOUNTER — Inpatient Hospital Stay: Payer: Medicare Other | Admitting: Medical

## 2018-02-24 ENCOUNTER — Other Ambulatory Visit: Payer: Self-pay | Admitting: Hematology

## 2018-02-24 VITALS — BP 118/82 | HR 93 | Temp 97.9°F | Resp 18 | Ht 69.0 in | Wt 184.8 lb

## 2018-02-24 DIAGNOSIS — D696 Thrombocytopenia, unspecified: Secondary | ICD-10-CM

## 2018-02-24 DIAGNOSIS — C8318 Mantle cell lymphoma, lymph nodes of multiple sites: Secondary | ICD-10-CM

## 2018-02-24 DIAGNOSIS — H548 Legal blindness, as defined in USA: Secondary | ICD-10-CM

## 2018-02-24 DIAGNOSIS — R110 Nausea: Secondary | ICD-10-CM

## 2018-02-24 DIAGNOSIS — J910 Malignant pleural effusion: Secondary | ICD-10-CM

## 2018-02-24 DIAGNOSIS — Z79899 Other long term (current) drug therapy: Secondary | ICD-10-CM

## 2018-02-24 DIAGNOSIS — C8598 Non-Hodgkin lymphoma, unspecified, lymph nodes of multiple sites: Secondary | ICD-10-CM

## 2018-02-24 DIAGNOSIS — R112 Nausea with vomiting, unspecified: Secondary | ICD-10-CM

## 2018-02-24 DIAGNOSIS — F17210 Nicotine dependence, cigarettes, uncomplicated: Secondary | ICD-10-CM

## 2018-02-24 DIAGNOSIS — Z5112 Encounter for antineoplastic immunotherapy: Secondary | ICD-10-CM | POA: Diagnosis not present

## 2018-02-24 DIAGNOSIS — R5383 Other fatigue: Secondary | ICD-10-CM

## 2018-02-24 DIAGNOSIS — Z95828 Presence of other vascular implants and grafts: Secondary | ICD-10-CM

## 2018-02-24 DIAGNOSIS — K5900 Constipation, unspecified: Secondary | ICD-10-CM

## 2018-02-24 DIAGNOSIS — E43 Unspecified severe protein-calorie malnutrition: Secondary | ICD-10-CM

## 2018-02-24 DIAGNOSIS — H3552 Pigmentary retinal dystrophy: Secondary | ICD-10-CM

## 2018-02-24 DIAGNOSIS — R0789 Other chest pain: Secondary | ICD-10-CM

## 2018-02-24 LAB — CBC WITH DIFFERENTIAL/PLATELET
BASOS ABS: 0.1 10*3/uL (ref 0.0–0.1)
BASOS PCT: 1 %
EOS ABS: 0.1 10*3/uL (ref 0.0–0.5)
Eosinophils Relative: 1 %
HEMATOCRIT: 41.5 % (ref 38.4–49.9)
HEMOGLOBIN: 13.3 g/dL (ref 13.0–17.1)
Lymphocytes Relative: 3 %
Lymphs Abs: 0.2 10*3/uL — ABNORMAL LOW (ref 0.9–3.3)
MCH: 28.8 pg (ref 27.2–33.4)
MCHC: 32.1 g/dL (ref 32.0–36.0)
MCV: 89.8 fL (ref 79.3–98.0)
Monocytes Absolute: 0.8 10*3/uL (ref 0.1–0.9)
Monocytes Relative: 10 %
NEUTROS ABS: 6.7 10*3/uL — AB (ref 1.5–6.5)
NEUTROS PCT: 85 %
Platelets: 168 10*3/uL (ref 140–400)
RBC: 4.63 MIL/uL (ref 4.20–5.82)
RDW: 15.9 % — AB (ref 11.0–14.6)
WBC: 7.8 10*3/uL (ref 4.0–10.3)

## 2018-02-24 LAB — COMPREHENSIVE METABOLIC PANEL
ALBUMIN: 2.8 g/dL — AB (ref 3.5–5.0)
ALK PHOS: 94 U/L (ref 40–150)
ALT: 16 U/L (ref 0–55)
ANION GAP: 10 (ref 3–11)
AST: 12 U/L (ref 5–34)
BUN: 12 mg/dL (ref 7–26)
CO2: 27 mmol/L (ref 22–29)
Calcium: 9.4 mg/dL (ref 8.4–10.4)
Chloride: 102 mmol/L (ref 98–109)
Creatinine, Ser: 0.97 mg/dL (ref 0.70–1.30)
GFR calc Af Amer: >60 mL/min (ref >60)
GFR calc non Af Amer: >60 mL/min (ref >60)
GLUCOSE: 193 mg/dL — AB (ref 70–140)
POTASSIUM: 3.9 mmol/L (ref 3.5–5.1)
SODIUM: 139 mmol/L (ref 136–145)
Total Bilirubin: 0.6 mg/dL (ref 0.2–1.2)
Total Protein: 5.4 g/dL — ABNORMAL LOW (ref 6.4–8.3)

## 2018-02-24 LAB — URIC ACID: Uric Acid, Serum: 5.7 mg/dL (ref 2.6–7.4)

## 2018-02-24 LAB — PHOSPHORUS: Phosphorus: 3.1 mg/dL (ref 2.5–4.6)

## 2018-02-24 LAB — LACTATE DEHYDROGENASE: LDH: 389 U/L — ABNORMAL HIGH (ref 125–245)

## 2018-02-24 MED ORDER — SODIUM CHLORIDE 0.9 % IJ SOLN
10.0000 mL | INTRAMUSCULAR | Status: DC | PRN
  Administered 2018-02-24: 10 mL via INTRAVENOUS
  Filled 2018-02-24: qty 10

## 2018-02-24 MED ORDER — DEXAMETHASONE 4 MG PO TABS: 4.0000 mg | ORAL_TABLET | Freq: Every day | ORAL | 0 refills | Status: DC

## 2018-02-24 MED ORDER — HEPARIN SOD (PORK) LOCK FLUSH 100 UNIT/ML IV SOLN
500.0000 [IU] | Freq: Once | INTRAVENOUS | Status: AC | PRN
  Administered 2018-02-24: 500 [IU] via INTRAVENOUS
  Filled 2018-02-24: qty 5

## 2018-02-24 MED ORDER — PROCHLORPERAZINE MALEATE 5 MG PO TABS: 10.0000 mg | ORAL_TABLET | Freq: Four times a day (QID) | ORAL | 1 refills | Status: AC | PRN

## 2018-02-24 MED ORDER — ONDANSETRON HCL 4 MG/2ML IJ SOLN
4.0000 mg | Freq: Once | INTRAMUSCULAR | Status: AC
  Administered 2018-02-24: 4 mg via INTRAVENOUS

## 2018-02-24 MED ORDER — MORPHINE SULFATE (PF) 4 MG/ML IV SOLN
INTRAVENOUS | Status: AC
  Filled 2018-02-24: qty 1

## 2018-02-24 MED ORDER — ONDANSETRON HCL 4 MG/2ML IJ SOLN
INTRAMUSCULAR | Status: AC
  Filled 2018-02-24: qty 2

## 2018-02-24 MED ORDER — ONDANSETRON HCL 4 MG/2ML IJ SOLN: 4.0000 mg | Freq: Once | INTRAMUSCULAR | Status: DC

## 2018-02-24 MED ORDER — MORPHINE SULFATE (PF) 4 MG/ML IV SOLN
2.0000 mg | Freq: Once | INTRAVENOUS | Status: AC
  Administered 2018-02-24: 2 mg via INTRAVENOUS

## 2018-02-24 MED ORDER — SODIUM CHLORIDE 0.9 % IV SOLN
Freq: Once | INTRAVENOUS | Status: AC
  Administered 2018-02-24: 10:00:00 via INTRAVENOUS

## 2018-02-24 MED FILL — PROCHLORPERAZINE 5 MG TAB: 5 | 6 days supply | Qty: 45 | Fill #0

## 2018-02-24 MED FILL — DEXAMETHASONE 4 MG TABLET: 4 | 28 days supply | Qty: 28 | Fill #0

## 2018-02-24 NOTE — Patient Instructions (Signed)
Dehydration, Adult Dehydration is when there is not enough fluid or water in your body. This happens when you lose more fluids than you take in. Dehydration can range from mild to very bad. It should be treated right away to keep it from getting very bad. Symptoms of mild dehydration may include:  Thirst.  Dry lips.  Slightly dry mouth.  Dry, warm skin.  Dizziness. Symptoms of moderate dehydration may include:  Very dry mouth.  Muscle cramps.  Dark pee (urine). Pee may be the color of tea.  Your body making less pee.  Your eyes making fewer tears.  Heartbeat that is uneven or faster than normal (palpitations).  Headache.  Light-headedness, especially when you stand up from sitting.  Fainting (syncope). Symptoms of very bad dehydration may include:  Changes in skin, such as: ? Cold and clammy skin. ? Blotchy (mottled) or pale skin. ? Skin that does not quickly return to normal after being lightly pinched and let go (poor skin turgor).  Changes in body fluids, such as: ? Feeling very thirsty. ? Your eyes making fewer tears. ? Not sweating when body temperature is high, such as in hot weather. ? Your body making very little pee.  Changes in vital signs, such as: ? Weak pulse. ? Pulse that is more than 100 beats a minute when you are sitting still. ? Fast breathing. ? Low blood pressure.  Other changes, such as: ? Sunken eyes. ? Cold hands and feet. ? Confusion. ? Lack of energy (lethargy). ? Trouble waking up from sleep. ? Short-term weight loss. ? Unconsciousness. Follow these instructions at home:  If told by your doctor, drink an ORS: ? Make an ORS by using instructions on the package. ? Start by drinking small amounts, about  cup (120 mL) every 5-10 minutes. ? Slowly drink more until you have had the amount that your doctor said to have.  Drink enough clear fluid to keep your pee clear or pale yellow. If you were told to drink an ORS, finish the ORS  first, then start slowly drinking clear fluids. Drink fluids such as: ? Water. Do not drink only water by itself. Doing that can make the salt (sodium) level in your body get too low (hyponatremia). ? Ice chips. ? Fruit juice that you have added water to (diluted). ? Low-calorie sports drinks.  Avoid: ? Alcohol. ? Drinks that have a lot of sugar. These include high-calorie sports drinks, fruit juice that does not have water added, and soda. ? Caffeine. ? Foods that are greasy or have a lot of fat or sugar.  Take over-the-counter and prescription medicines only as told by your doctor.  Do not take salt tablets. Doing that can make the salt level in your body get too high (hypernatremia).  Eat foods that have minerals (electrolytes). Examples include bananas, oranges, potatoes, tomatoes, and spinach.  Keep all follow-up visits as told by your doctor. This is important. Contact a doctor if:  You have belly (abdominal) pain that: ? Gets worse. ? Stays in one area (localizes).  You have a rash.  You have a stiff neck.  You get angry or annoyed more easily than normal (irritability).  You are more sleepy than normal.  You have a harder time waking up than normal.  You feel: ? Weak. ? Dizzy. ? Very thirsty.  You have peed (urinated) only a small amount of very dark pee during 6-8 hours. Get help right away if:  You have symptoms of   very bad dehydration.  You cannot drink fluids without throwing up (vomiting).  Your symptoms get worse with treatment.  You have a fever.  You have a very bad headache.  You are throwing up or having watery poop (diarrhea) and it: ? Gets worse. ? Does not go away.  You have blood or something green (bile) in your throw-up.  You have blood in your poop (stool). This may cause poop to look black and tarry.  You have not peed in 6-8 hours.  You pass out (faint).  Your heart rate when you are sitting still is more than 100 beats a  minute.  You have trouble breathing. This information is not intended to replace advice given to you by your health care provider. Make sure you discuss any questions you have with your health care provider. Document Released: 07/21/2009 Document Revised: 04/13/2016 Document Reviewed: 11/18/2015 Elsevier Interactive Patient Education  2018 [employer]  

## 2018-02-24 NOTE — Progress Notes (Signed)
Pt presented today with c/o dryness, fatigue, constipation, and nausea. Pt verbalized stopping venetoclax on Saturday, and would like to stop taking it because "it is not working, and the symptoms are worse than the help." Pt unable to take nausea medication orally. Offered IV antiemetic, but pt refused at this time. 3 hour IVF started. Discussed with Dr. Irene Limbo, primary oncologist, and if pt decides to stop venetoclax treatment, other option is Hospice at this time. Dr. Irene Limbo verbalized that the pt may be having symptoms that we can manage. Request to create visit with Sandi Mealy, PA. Additionally, relayed to pt per Dr. Irene Limbo that the symptoms if he stops the medication are most likely worse than the symptoms he is experiencing now. The pt may feel as though the venetoclax is not working due to symptoms, but it has improved LDH as discussed on Friday with Dr. Irene Limbo. PA in agreement to see pt.

## 2018-02-24 NOTE — Telephone Encounter (Signed)
After discussion with Dr. Irene Limbo, pt decided to start venetoclax back tomorrow. Pt daughter made aware to call back once medication taken. Pt to be scheduled for lab addon 4 hours post venetoclax administration at home. Lab orders entered for CMP, Uric acid, LDH, and Phosphorous per Johny Drilling, [facility] based on protocol.

## 2018-02-24 NOTE — Telephone Encounter (Signed)
F/u as per scheduled appointment. Per 5/17 los

## 2018-02-25 ENCOUNTER — Inpatient Hospital Stay: Payer: Medicare Other

## 2018-02-25 ENCOUNTER — Telehealth: Payer: Self-pay | Admitting: *Deleted

## 2018-02-25 NOTE — Telephone Encounter (Signed)
Pt's daughter Kristopher Johnson called asking what dose of venetoclax patient should start with for week 2 of treatment.  Per Dr. Irene Limbo, pt to take as prescribed, 50mg  week 2.  Patient to come in late this afternoon or early tomorrow morning for lab work.  Daughter opted to come tomorrow morning.  Message sent to scheduling.

## 2018-02-26 ENCOUNTER — Inpatient Hospital Stay: Payer: Medicare Other

## 2018-02-26 DIAGNOSIS — C8318 Mantle cell lymphoma, lymph nodes of multiple sites: Secondary | ICD-10-CM

## 2018-02-26 DIAGNOSIS — Z5112 Encounter for antineoplastic immunotherapy: Secondary | ICD-10-CM | POA: Diagnosis not present

## 2018-02-26 DIAGNOSIS — C8598 Non-Hodgkin lymphoma, unspecified, lymph nodes of multiple sites: Secondary | ICD-10-CM

## 2018-02-26 LAB — CBC WITH DIFFERENTIAL/PLATELET
Basophils Absolute: 0 10*3/uL (ref 0.0–0.1)
Basophils Relative: 1 %
EOS PCT: 0 %
Eosinophils Absolute: 0 10*3/uL (ref 0.0–0.5)
HCT: 40.7 % (ref 38.4–49.9)
HEMOGLOBIN: 13 g/dL (ref 13.0–17.1)
LYMPHS ABS: 0.3 10*3/uL — AB (ref 0.9–3.3)
LYMPHS PCT: 3 %
MCH: 28.6 pg (ref 27.2–33.4)
MCHC: 32 g/dL (ref 32.0–36.0)
MCV: 89.5 fL (ref 79.3–98.0)
MONOS PCT: 10 %
Monocytes Absolute: 1 10*3/uL — ABNORMAL HIGH (ref 0.1–0.9)
Neutro Abs: 8.4 10*3/uL — ABNORMAL HIGH (ref 1.5–6.5)
Neutrophils Relative %: 86 %
Platelets: 167 10*3/uL (ref 140–400)
RBC: 4.55 MIL/uL (ref 4.20–5.82)
RDW: 15.9 % — ABNORMAL HIGH (ref 11.0–14.6)
WBC: 9.8 10*3/uL (ref 4.0–10.3)

## 2018-02-26 LAB — COMPREHENSIVE METABOLIC PANEL
ALBUMIN: 2.9 g/dL — AB (ref 3.5–5.0)
ALK PHOS: 106 U/L (ref 40–150)
ALT: 14 U/L (ref 0–55)
AST: 10 U/L (ref 5–34)
Anion gap: 9 (ref 3–11)
BUN: 12 mg/dL (ref 7–26)
CALCIUM: 9.5 mg/dL (ref 8.4–10.4)
CHLORIDE: 100 mmol/L (ref 98–109)
CO2: 30 mmol/L — AB (ref 22–29)
CREATININE: 0.92 mg/dL (ref 0.70–1.30)
GFR calc Af Amer: >60 mL/min (ref >60)
GFR calc non Af Amer: >60 mL/min (ref >60)
Glucose, Bld: 175 mg/dL — ABNORMAL HIGH (ref 70–140)
Potassium: 4.6 mmol/L (ref 3.5–5.1)
SODIUM: 139 mmol/L (ref 136–145)
Total Bilirubin: 0.6 mg/dL (ref 0.2–1.2)
Total Protein: 5.6 g/dL — ABNORMAL LOW (ref 6.4–8.3)

## 2018-02-26 LAB — LACTATE DEHYDROGENASE: LDH: 408 U/L — ABNORMAL HIGH (ref 125–245)

## 2018-02-26 LAB — URIC ACID: Uric Acid, Serum: 5.5 mg/dL (ref 2.6–7.4)

## 2018-02-26 LAB — PHOSPHORUS: Phosphorus: 3.7 mg/dL (ref 2.5–4.6)

## 2018-02-26 NOTE — Progress Notes (Signed)
[facility] Progress Note   LASZLO ELLERBY [id number] 08/29/1956 62 y.o.  [name] is managed by Dr. 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  He was also given a prescription for Compazine 5 mg p.o. 4 times daily as needed for nausea.  Abdominal pain: Patient was given morphine 2 mg IV x1 and was encouraged to use the prescription for tramadol he has as needed.     Mantle cell lymphoma.  The patient was seen with Dr. Sullivan Lone today.  Dr. Sullivan Lone instruct the patient to restart venetoclax tomorrow.  He will have labs completed tomorrow.  He is scheduled to see Dr. Sullivan Lone in follow-up on 03/06/2018.  Please see After Visit Summary for patient specific instructions.  Future Appointments  Date Time Provider [place]  02/11/2018 10:00 AM [facility] LAB 4 [facility] None  03/06/2018  9:00 AM Brunetta Genera, MD [facility] None  03/06/2018 10:00 AM [facility] PROCEDURE 2 [facility] None  03/06/2018 10:30 AM Karie Mainland, RD [facility] None  05/21/2018 10:15 AM Titus Dubin, RN [facility] None  05/21/2018 11:30 AM Gildardo Cranker, DO [facility] None    No orders of the defined types were placed in this encounter.      Subjective:   Patient ID:  ESTEN DOLLAR is a 62 y.o. (DOB [birth year]) male.  Chief Complaint: No chief complaint on file.   HPI DRESEAN BECKEL is a 62 year old male with a history of a mantle cell lymphoma  venetoclax and Rituxan who is managed by Dr. Sullivan Lone and who is currently treated with venetoclax and Rituxan.  He presents to the office  today with a report that he is having nausea, constipation, oral dryness, fatigue, anorexia secondary to splenic enlargement, and overall feelings of ill health.  His Pleurx catheter in his left chest wall was drained on Saturday with 1 L removed.  He stopped taking venetoclax on Saturday.  He reports abdominal pain due to his splenic enlargement.  Medications: I have reviewed the patient's current medications.  Allergies: No Known Allergies  Past Medical History:  Diagnosis Date  . Abscess of arm, right 10/27/2015  . Lymphoma of lymph nodes of multiple sites ([place]) 10/27/2015  . 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 Shortness of breath 10/27/2015    Past Surgical History:  Procedure Laterality Date  . Cataract surgery Bilateral    Patient notes she has had bilateral Surgery in 1998 and 99  . FLEXIBLE SIGMOIDOSCOPY N/A 03/01/2017   Procedure: FLEXIBLE SIGMOIDOSCOPY;  Surgeon: Manus Gunning, MD;  Location: [facility];  Service: Gastroenterology;  Laterality: N/A;  . IR GUIDED [place]  02/20/2018  . TONSILLECTOMY     about 62years old    Family History  Problem Relation Age of Onset  . Colon cancer Neg Hx     Social History   Socioeconomic History  . Marital status: Widowed    Spouse name: Not on file  . Number of children: Not on file  .  Years of education: Not on file  . Highest education level: Not on file  Occupational History  . Not on file  Social Needs  . Financial resource strain: Not on file  . Food insecurity:    Worry: Not on file    Inability: Not on file  . Transportation needs:    Medical: Not on file    Non-medical: Not on file  Tobacco Use  . Smoking status: Current Every Day Smoker    Packs/day: 1.00    Years: 33.00    Pack years: 33.00    Types: Cigarettes  . Smokeless tobacco: Never Used  Substance and Sexual Activity  . Alcohol  use: No  . Drug use: No  . Sexual activity: Not on file    Comment: Disabled, retinis pigmentosa legally blind, 4children MPOA Lynnn(cousin)  Lifestyle  . Physical activity:    Days per week: Not on file    Minutes per session: Not on file  . Stress: Not on file  Relationships  . Social connections:    Talks on phone: Not on file    Gets together: Not on file    Attends religious service: Not on file    Active member of club or organization: Not on file    Attends meetings of clubs or organizations: Not on file    Relationship status: Not on file  . Intimate partner violence:    Fear of current or ex partner: Not on file    Emotionally abused: Not on file    Physically abused: Not on file    Forced sexual activity: Not on file  Other Topics Concern  . Not on file  Social History Narrative  . Not on file    Past Medical History, Surgical history, Social history, and Family history were reviewed and updated as appropriate.   Please see review of systems for further details on the patient's review from today.   Review of Systems:  Review of Systems  Constitutional: Positive for fatigue. Negative for appetite change, chills, diaphoresis, fever and unexpected weight change.  Gastrointestinal: Positive for abdominal distention, abdominal pain, constipation and nausea. Negative for anal bleeding, blood in stool, diarrhea, rectal pain and vomiting.    Objective:   Physical Exam:  There were no vitals taken for this visit. ECOG: 1  Physical Exam  Constitutional: No distress.  HENT:  Head: Normocephalic and atraumatic.  Mouth/Throat: Oropharynx is clear and moist.  Cardiovascular: Normal rate, regular rhythm and normal heart sounds. Exam reveals no gallop and no friction rub.  No murmur heard. Pulmonary/Chest: Effort normal. No respiratory distress. He has no wheezes. He has no rales.  There is a Pleurx catheter in the left chest wall.  Abdominal: Soft. Bowel sounds are  normal. He exhibits distension. He exhibits no mass. There is tenderness. There is no rebound and no guarding.  Musculoskeletal: He exhibits no edema.  Neurological: He is alert.  Skin: Skin is warm and dry. He is not diaphoretic.    Lab Review:     Component Value Date/Time   NA 139 02/26/2018 0909   NA 138 09/17/2017 1313   K 4.6 02/26/2018 0909   K 3.8 09/17/2017 1313   CL 100 02/26/2018 0909   CO2 30 (H) 02/26/2018 0909   CO2 26 09/17/2017 1313   GLUCOSE 175 (H) 02/26/2018 0909   GLUCOSE 323 (H) 09/17/2017 1313   BUN 12 02/26/2018 0909   BUN 11.2 09/17/2017 1313   CREATININE 0.92 02/26/2018 0909  CREATININE 0.94 02/21/2018 1028   CREATININE 1.1 09/17/2017 1313   CALCIUM 9.5 02/26/2018 0909   CALCIUM 9.5 09/17/2017 1313   PROT 5.6 (L) 02/26/2018 0909   PROT 6.7 09/17/2017 1313   ALBUMIN 2.9 (L) 02/26/2018 0909   ALBUMIN 3.6 09/17/2017 1313   AST 10 02/26/2018 0909   AST 13 02/21/2018 1028   AST 10 09/17/2017 1313   ALT 14 02/26/2018 0909   ALT 22 02/21/2018 1028   ALT 11 09/17/2017 1313   ALKPHOS 106 02/26/2018 0909   ALKPHOS 87 09/17/2017 1313   BILITOT 0.6 02/26/2018 0909   BILITOT 0.6 02/21/2018 1028   BILITOT 0.33 09/17/2017 1313   GFRNONAA >60 02/26/2018 0909   GFRNONAA >60 02/21/2018 1028   GFRAA >60 02/26/2018 0909   GFRAA >60 02/21/2018 1028       Component Value Date/Time   WBC 9.8 02/26/2018 0909   RBC 4.55 02/26/2018 0909   HGB 13.0 02/26/2018 0909   HGB 13.7 02/21/2018 1028   HGB 13.8 09/17/2017 1313   HCT 40.7 02/26/2018 0909   HCT 43.2 09/17/2017 1313   PLT 167 02/26/2018 0909   PLT 157 02/21/2018 1028   PLT 202 09/17/2017 1313   MCV 89.5 02/26/2018 0909   MCV 96.4 09/17/2017 1313   MCH 28.6 02/26/2018 0909   MCHC 32.0 02/26/2018 0909   RDW 15.9 (H) 02/26/2018 0909   RDW 14.3 09/17/2017 1313   LYMPHSABS 0.3 (L) 02/26/2018 0909   LYMPHSABS 0.9 09/17/2017 1313   MONOABS 1.0 (H) 02/26/2018 0909   MONOABS 0.3 09/17/2017 1313   EOSABS  0.0 02/26/2018 0909   EOSABS 0.2 09/17/2017 1313   BASOSABS 0.0 02/26/2018 0909   BASOSABS 0.0 09/17/2017 1313   -------------------------------  Imaging from last 24 hours (if applicable):  Radiology interpretation: Dg Chest 1 View  Result Date: 02/14/2018 CLINICAL DATA:  Status post LEFT thoracentesis today. EXAM: CHEST  1 VIEW COMPARISON:  02/06/2018 and prior studies FINDINGS: Decreased size of loculated LEFT pleural effusion noted. There is no evidence of pneumothorax. LEFT LOWER lung atelectasis/opacity again noted. A RIGHT Port-A-Cath with tip overlying the LOWER SVC again noted. The RIGHT lung is clear. IMPRESSION: Decreased loculated LEFT pleural effusion. No evidence of pneumothorax. Electronically Signed   By: Margarette Canada M.D.   On: 02/14/2018 12:55   Dg Chest 1 View  Result Date: 02/04/2018 CLINICAL DATA:  Status post thoracentesis EXAM: CHEST  1 VIEW COMPARISON:  February 03, 2018 FINDINGS: No pneumothorax. There is moderate loculated effusion on the left currently with patchy consolidation in the left base. Right lung is clear. Heart size and pulmonary vascularity are normal. Port-A-Cath tip is in the superior vena cava. No adenopathy. No bone lesions. IMPRESSION: Moderate residual loculated effusion after thoracentesis. No pneumothorax. There is patchy consolidation in the left base as well. Lungs elsewhere clear. Heart size normal. Electronically Signed   By: Lowella Grip III M.D.   On: 02/04/2018 10:46   Dg Chest 1 View  Result Date: 01/29/2018 CLINICAL DATA:  Post left thoracentesis EXAM: CHEST  1 VIEW COMPARISON:  01/28/2018 FINDINGS: Small to moderate residual left effusion following thoracentesis. No pneumothorax. Port-A-Cath is in place, unchanged. Heart is upper limits normal in size. Left base atelectasis or infiltrate, improved since prior study. IMPRESSION: Improving left pleural effusion and left base atelectasis or infiltrate. No pneumothorax. Electronically Signed    By: Rolm Baptise M.D.   On: 01/29/2018 13:24   Dg Chest 2 View  Result Date: 02/06/2018  CLINICAL DATA:  Status post left-sided thoracentesis last week. The patient is were reporting worsening left-sided chest pain and shortness of breath. EXAM: CHEST - 2 VIEW COMPARISON:  PET-CT study of Feb 05, 2018 and chest x-ray of February 04, 2018 FINDINGS: There has been reaccumulation of pleural fluid on the left. It occupies approximately 1/2 of the pleural space volume. There is no significant mediastinal shift. The right lung is clear. The heart and pulmonary vascularity are normal. The power port catheter tip projects over the midportion of the SVC. The bony thorax exhibits no acute abnormality. IMPRESSION: Interval re-accumulation of the left pleural effusion over the past 2 days. No pneumothorax or mediastinal shift. No CHF. Electronically Signed   By: David  Martinique M.D.   On: 02/06/2018 11:18   Dg Chest 2 View  Result Date: 02/03/2018 CLINICAL DATA:  Shortness of breath. EXAM: CHEST - 2 VIEW COMPARISON:  Radiograph of January 29, 2018. FINDINGS: Mild cardiomegaly is noted. Right lung is clear. Right internal jugular Port-A-Cath is noted with distal tip in expected position of SVC. Large left pleural effusion is noted with underlying atelectasis or infiltrate. Bony thorax is unremarkable. IMPRESSION: Large left pleural effusion is noted with underlying atelectasis or infiltrate. Electronically Signed   By: Marijo Conception, M.D.   On: 02/03/2018 11:44   Ct Angio Chest Pe W Or Wo Contrast  Result Date: 01/28/2018 CLINICAL DATA:  Five-day history of shortness of breath. History of non-Hodgkin's lymphoma. EXAM: CT ANGIOGRAPHY CHEST WITH CONTRAST TECHNIQUE: Multidetector CT imaging of the chest was performed using the standard protocol during bolus administration of intravenous contrast. Multiplanar CT image reconstructions and MIPs were obtained to evaluate the vascular anatomy. CONTRAST:  168mL ISOVUE-370 IOPAMIDOL  (ISOVUE-370) INJECTION 76% COMPARISON:  PET-CT 10/25/2017 FINDINGS: Cardiovascular: The heart is normal in size. No pericardial effusion. The aorta is normal in caliber. No dissection. The branch vessels are patent. The pulmonary arterial tree is fairly well opacified. Breathing motion artifact limits examination but no definite filling defects to suggest pulmonary embolism. Mediastinum/Nodes: Much improved right axillary and subpectoral adenopathy. The mediastinal and hilar adenopathy is also much improved. 10 mm precarinal lymph node on image number 42 previously measured 16 mm. The 22 mm subcarinal nodal mass seen on the prior study has resolved. Lungs/Pleura: Large pleural lesions have significantly improved since the prior examination. The pleural lesion or internal mammary adenopathy on the left side measures 25 mm and previously measured 37 mm. The large pleural mass on the left side slightly more inferiorly has completely resolved. The right internal mammary adenopathy has largely resolved. The pleural mass at the left lung base laterally on image number 74 appears relatively stable. Persistent moderate-sized left pleural effusion with associated enhancing pleural nodularity posteriorly appears somewhat improved. Patchy airspace disease in the left upper lobe and lingula, likely pneumonia. This may account for the patient's shortness of breath. The right lung remains clear.  No right-sided pleural effusion. Upper Abdomen: Stable left hepatic lobe cyst. Stable upper abdominal lymphadenopathy. Musculoskeletal: No significant bony findings. Stable small scattered sclerotic bone lesions, likely benign bone islands. Review of the MIP images confirms the above findings. IMPRESSION: 1. No definite CT findings for pulmonary embolism. Exam is somewhat limited by breathing motion artifact. 2. Left upper lobe and lingula pneumonia. 3. Overall improved lymphadenopathy and left-sided pleural disease. Persistent left  effusion. Electronically Signed   By: Marijo Sanes M.D.   On: 01/28/2018 10:33   Ir Guided Niel Hummer W Catheter Placement  Result Date: 02/21/2018 INDICATION: 62 year old male with a history of recurrent malignant left-sided pleural effusion. EXAM: IMAGE GUIDED TUNNELED PLEURAL CATHETER MEDICATIONS: 2.0 g Ancef. The antibiotics were administered within an appropriate time frame prior to the initiation of the procedure. ANESTHESIA/SEDATION: Fentanyl 100 mcg IV; Versed 2.0 mg IV Moderate Sedation Time:  13 minutes The patient was continuously monitored during the procedure by the interventional radiology nurse under my direct supervision. COMPLICATIONS: None PROCEDURE: The procedure, risks, benefits, and alternatives were explained to the patient and the patient's family. Specific risks that were addressed included bleeding, infection, pneumothorax, need for further procedure, chance of delayed pneumothorax or hemorrhage, hemoptysis, cardiopulmonary collapse, death. Questions regarding the procedure were encouraged and answered. The patient understands and consents to the procedure. The left chest wall was prepped with Betadine in a sterile fashion, and a sterile drape was applied covering the operative field. A sterile gown and sterile gloves were used for the procedure. Local anesthesia was provided with 1% Lidocaine. Ultrasound image documentation was performed. After creating a small skin incision, a 19 gauge needle was advanced into the pleural cavity under ultrasound guidance. A guide wire was then advanced under fluoroscopy into the pleural space. Pleural access was dilated serially and a 16-French peel-away sheath placed. The skin and subcutaneous tissues were generously infiltrated with 1% lidocaine from the puncture site over the pleura along the intercostal margin anteriorly. A small stab incision was made with 11 blade scalpel at the insertion site of the catheter, and the catheter was back tunneled to  the site at the pleural puncture. A tunneled CareFusion Pleurex catheter was placed. This was tunneled from the incision 5 cm anterior to the pleural access to the access site. The catheter was advanced through the peel-away sheath. The sheath was then removed. Final catheter positioning was confirmed with a fluoroscopic spot image. The access incision was closed with Dermabond, applied to the catheterization incision. Large volume thoracentesis was performed through the new catheter utilizing gravity drainage bag. The patient tolerated the procedure well and remained hemodynamically stable throughout. No complications were encountered and no significant blood loss was encountered. IMPRESSION: Status post left-sided tunneled pleural catheter. Signed, Dulcy Fanny. Earleen Newport, DO [facility] Electronically Signed   By: Corrie Mckusick D.O.   On: 02/21/2018 08:45   Nm Pet Image Restag (ps) Skull Base To Thigh  Result Date: 02/05/2018 CLINICAL DATA:  Subsequent treatment strategy for mantle cell lymphoma, stage IV. EXAM: NUCLEAR MEDICINE PET SKULL BASE TO THIGH TECHNIQUE: 10.2 mCi F-18 FDG was injected intravenously. Full-ring PET imaging was performed from the skull base to thigh after the radiotracer. CT data was obtained and used for attenuation correction and anatomic localization. Fasting blood glucose: 103 mg/dl COMPARISON:  PET-CT 10/25/2017, 9 /12/18 FINDINGS: Mediastinal blood pool activity: SUV max 2.4 NECK: No hypermetabolic lymph nodes in the neck. Incidental CT findings: none CHEST: There is decreased thickening of the masslike pleural metastasis in the LEFT hemithorax however there is increase in confluence of the pleural involvement. The near entirety of the LEFT hemithorax pleural surface is now involved by the hypermetabolic malignancy with SUV max equal 10.0. Again individual mass lesion decreased in size. For example 3 cm thick lesion in the anterior  aspect of the LEFT upper lobe is decreased to 1 cm. Some lesions remain unchanged. For example mass lesion at the level of the LEFT internal mammary nodes measures 3.4 cm compared to 3.6 cm with SUV max equal 15  not changed. Large loculated LEFT effusion remains. There are intensely hypermetabolic mediastinal lymph nodes and internal mammary lymph nodes on the RIGHT unchanged. Interval resolution of hypermetabolic lymph node in the RIGHT axilla. Resolution of the pleural metastasis in the RIGHT hemithorax. Incidental CT findings: Large loculated effusions in the LEFT hemithorax ABDOMEN/PELVIS: The dominant finding is interval increase in the size the spleen as well as new metabolic activity. Spleen measures 10.8 x 6.8 cm in axial dimension with SUV max equal 10.9 compared to 5.0 by 3.4 cm with no significant metabolic activity background on comparison exam. Conversely, the large mass lesion associated with the rectum is near completely resolved. Lymph nodes in the retroperitoneum have near completely resolved. Example lesion anterior to the RIGHT psoas and IVC previously measured 3.2 cm is no longer measurable. Additional cutaneous nodules have improved and resolved in the pelvis. Example previous nodule in the soft tissues and muscles anterior to the LEFT femoral head has resolved. Incidental CT findings: none SKELETON: No focal hypermetabolic activity to suggest skeletal metastasis. Incidental CT findings: none IMPRESSION: 1. Mixed response to therapy. 2. Decrease in size pleural metastasis in the RIGHT and LEFT lungs. Increase in confluent hypermetabolic pleural lesions LEFT hemithorax. 3. Persistent large loculated LEFT pleural effusions. 4. Persistent hypermetabolic mediastinal lymph nodes and masses 5. 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 6. Marked improvement in mass lesion previous associated with rectum. 7. Additional improvement in  lymph nodes in the retroperitoneum and subcutaneous nodes. Electronically Signed   By: Suzy Bouchard M.D.   On: 02/05/2018 17:36   Dg Chest Port 1 View  Result Date: 01/28/2018 CLINICAL DATA:  Status post left-sided thoracentesis yesterday. History of lymphoma. EXAM: PORTABLE CHEST 1 VIEW COMPARISON:  Postprocedure chest x-ray of January 27, 2018 FINDINGS: The right lung is well-expanded and clear. On the left there is increased density in the mid to lower lung. The left pleural effusion is smaller but atelectasis or infiltrate is more apparent in the lower 1/2 of the left hemithorax today. The heart is not enlarged. The pulmonary vascularity remains prominent centrally. The power port catheter tip projects over the midportion of the SVC. IMPRESSION: Interval increase conspicuity of atelectasis or pneumonia at the left lung base. Persistent small left pleural effusion. Electronically Signed   By: David  Martinique M.D.   On: 01/28/2018 12:02   US Thoracentesis Asp Pleural Space W/img Guide  Result Date: 02/14/2018 INDICATION: Patient with history of mantle cell lymphoma with recurrent symptomatic malignant left pleural effusion. Request made for therapeutic left thoracentesis. EXAM: ULTRASOUND GUIDED THERAPEUTIC LEFT THORACENTESIS MEDICATIONS: None COMPLICATIONS: None immediate. PROCEDURE: An ultrasound guided thoracentesis was thoroughly discussed with the patient and questions answered. The benefits, risks, alternatives and complications were also discussed. The patient understands and wishes to proceed with the procedure. Written consent was obtained. Ultrasound was performed to localize and mark an adequate pocket of fluid in the left chest. The area was then prepped and draped in the normal sterile fashion. 1% Lidocaine was used for local anesthesia. Under ultrasound guidance a 6 Fr Safe-T-Centesis catheter was introduced. Thoracentesis was performed. The catheter was removed and a dressing applied.  FINDINGS: A total of approximately 2.4 liters of blood-tinged fluid was removed. IMPRESSION: Successful ultrasound guided therapeutic left thoracentesis yielding 2.4 liters of pleural fluid. Follow-up chest x-ray revealed no pneumothorax. Read by: Rowe Stanely, PA-C Electronically Signed   By: Jacqulynn Cadet M.D.   On: 02/14/2018 13:24  US Thoracentesis Asp Pleural Space W/img Guide  Result Date: 02/04/2018 INDICATION: Patient with history of mantle cell lymphoma and recurrent symptomatic malignant left pleural effusion. Request made for therapeutic left thoracentesis. EXAM: ULTRASOUND GUIDED THERAPEUTIC LEFT THORACENTESIS MEDICATIONS: None COMPLICATIONS: None immediate. PROCEDURE: An ultrasound guided thoracentesis was thoroughly discussed with the patient and questions answered. The benefits, risks, alternatives and complications were also discussed. The patient understands and wishes to proceed with the procedure. Written consent was obtained. Ultrasound was performed to localize and mark an adequate pocket of fluid in the left chest. The area was then prepped and draped in the normal sterile fashion. 1% Lidocaine was used for local anesthesia. Under ultrasound guidance a 6 Fr Safe-T-Centesis catheter was introduced. Thoracentesis was performed. The catheter was removed and a dressing applied. FINDINGS: A total of approximately 2.2 liters of blood-tinged fluid was removed. Due to patient coughing only the above amount of fluid was removed today. IMPRESSION: Successful ultrasound guided therapeutic left thoracentesis yielding 2.2 liters of pleural fluid. Follow-up chest x-ray revealed no pneumothorax. Read by: Rowe Karo, PA-C Electronically Signed   By: Markus Daft M.D.   On: 02/04/2018 11:34   US Thoracentesis Asp Pleural Space W/img Guide  Result Date: 01/29/2018 INDICATION: Mantle cell lymphoma. Recurrent malignant pleural effusion. Request for therapeutic thoracentesis. EXAM: ULTRASOUND GUIDED  LEFT THORACENTESIS MEDICATIONS: 1% Lidocaine = 10 mL COMPLICATIONS: None immediate. PROCEDURE: An ultrasound guided thoracentesis was thoroughly discussed with the patient and questions answered. The benefits, risks, alternatives and complications were also discussed. The patient understands and wishes to proceed with the procedure. Written consent was obtained. Ultrasound was performed to localize and mark an adequate pocket of fluid in the left chest. The area was then prepped and draped in the normal sterile fashion. 1% Lidocaine was used for local anesthesia. Under ultrasound guidance a 6 Fr Safe-T-Centesis catheter was introduced. Thoracentesis was performed. The catheter was removed and a dressing applied. FINDINGS: A total of approximately 2.2 liters of clear amber fluid was removed. IMPRESSION: Successful ultrasound guided left thoracentesis yielding 2.2 liters of pleural fluid. No pneumothorax on post procedure chest X-ray. Read by: Gareth Eagle, PA-C Electronically Signed   By: Lucrezia Europe M.D.   On: 01/29/2018 14:05   US Thoracentesis Asp Pleural Space W/img Guide  Result Date: 01/27/2018 INDICATION: Patient with history of mantle cell lymphoma with recurrent malignant left pleural effusion, dyspnea. Request made for diagnostic and therapeutic left thoracentesis. EXAM: ULTRASOUND GUIDED DIAGNOSTIC AND THERAPEUTIC LEFT THORACENTESIS MEDICATIONS: None COMPLICATIONS: None immediate. PROCEDURE: An ultrasound guided thoracentesis was thoroughly discussed with the patient and questions answered. The benefits, risks, alternatives and complications were also discussed. The patient understands and wishes to proceed with the procedure. Written consent was obtained. Ultrasound was performed to localize and mark an adequate pocket of fluid in the left chest. The area was then prepped and draped in the normal sterile fashion. 1% Lidocaine was used for local anesthesia. Under ultrasound guidance a 6 Fr  Safe-T-Centesis catheter was introduced. Thoracentesis was performed. The catheter was removed and a dressing applied. FINDINGS: A total of approximately 2.8 liters of blood-tinged fluid was removed. Samples were sent to the laboratory as requested by the clinical team. IMPRESSION: Successful ultrasound guided diagnostic and therapeutic left thoracentesis yielding 2.8 liters of pleural fluid. Read by: Rowe Susie, PA-C Electronically Signed   By: Corrie Mckusick D.O.   On: 01/27/2018 17:16        This patient was seen with Dr. Irene Limbo with my  treatment plan reviewed with him. He expressed agreement with my medical management of this patient.

## 2018-03-04 ENCOUNTER — Telehealth: Payer: Self-pay | Admitting: Emergency Medicine

## 2018-03-04 ENCOUNTER — Other Ambulatory Visit: Payer: Medicare Other

## 2018-03-04 ENCOUNTER — Emergency Department (HOSPITAL_COMMUNITY): Payer: Medicare Other

## 2018-03-04 ENCOUNTER — Other Ambulatory Visit: Payer: Self-pay

## 2018-03-04 ENCOUNTER — Encounter (HOSPITAL_COMMUNITY): Payer: Self-pay | Admitting: *Deleted

## 2018-03-04 ENCOUNTER — Telehealth: Payer: Self-pay

## 2018-03-04 ENCOUNTER — Inpatient Hospital Stay: Payer: Medicare Other

## 2018-03-04 ENCOUNTER — Inpatient Hospital Stay (HOSPITAL_COMMUNITY)
Admission: EM | Admit: 2018-03-04 | Discharge: 2018-04-07 | DRG: 840 | Disposition: E | Payer: Medicare Other | Attending: Internal Medicine | Admitting: Internal Medicine

## 2018-03-04 DIAGNOSIS — Z95828 Presence of other vascular implants and grafts: Secondary | ICD-10-CM

## 2018-03-04 DIAGNOSIS — B370 Candidal stomatitis: Secondary | ICD-10-CM | POA: Diagnosis not present

## 2018-03-04 DIAGNOSIS — K5900 Constipation, unspecified: Secondary | ICD-10-CM | POA: Diagnosis present

## 2018-03-04 DIAGNOSIS — Z515 Encounter for palliative care: Secondary | ICD-10-CM

## 2018-03-04 DIAGNOSIS — J90 Pleural effusion, not elsewhere classified: Secondary | ICD-10-CM | POA: Diagnosis present

## 2018-03-04 DIAGNOSIS — E860 Dehydration: Secondary | ICD-10-CM | POA: Diagnosis present

## 2018-03-04 DIAGNOSIS — F17210 Nicotine dependence, cigarettes, uncomplicated: Secondary | ICD-10-CM | POA: Diagnosis present

## 2018-03-04 DIAGNOSIS — R0603 Acute respiratory distress: Secondary | ICD-10-CM | POA: Diagnosis present

## 2018-03-04 DIAGNOSIS — R161 Splenomegaly, not elsewhere classified: Secondary | ICD-10-CM | POA: Diagnosis present

## 2018-03-04 DIAGNOSIS — Z9841 Cataract extraction status, right eye: Secondary | ICD-10-CM

## 2018-03-04 DIAGNOSIS — C8318 Mantle cell lymphoma, lymph nodes of multiple sites: Secondary | ICD-10-CM | POA: Diagnosis present

## 2018-03-04 DIAGNOSIS — E43 Unspecified severe protein-calorie malnutrition: Secondary | ICD-10-CM | POA: Diagnosis present

## 2018-03-04 DIAGNOSIS — H3552 Pigmentary retinal dystrophy: Secondary | ICD-10-CM | POA: Diagnosis present

## 2018-03-04 DIAGNOSIS — R627 Adult failure to thrive: Secondary | ICD-10-CM | POA: Diagnosis present

## 2018-03-04 DIAGNOSIS — R0602 Shortness of breath: Secondary | ICD-10-CM

## 2018-03-04 DIAGNOSIS — J910 Malignant pleural effusion: Secondary | ICD-10-CM | POA: Diagnosis present

## 2018-03-04 DIAGNOSIS — Z7189 Other specified counseling: Secondary | ICD-10-CM

## 2018-03-04 DIAGNOSIS — E875 Hyperkalemia: Secondary | ICD-10-CM | POA: Diagnosis present

## 2018-03-04 DIAGNOSIS — D72823 Leukemoid reaction: Secondary | ICD-10-CM | POA: Diagnosis present

## 2018-03-04 DIAGNOSIS — N179 Acute kidney failure, unspecified: Secondary | ICD-10-CM | POA: Diagnosis present

## 2018-03-04 DIAGNOSIS — I951 Orthostatic hypotension: Secondary | ICD-10-CM | POA: Diagnosis present

## 2018-03-04 DIAGNOSIS — Z923 Personal history of irradiation: Secondary | ICD-10-CM

## 2018-03-04 DIAGNOSIS — Z9689 Presence of other specified functional implants: Secondary | ICD-10-CM | POA: Diagnosis present

## 2018-03-04 DIAGNOSIS — Z7401 Bed confinement status: Secondary | ICD-10-CM

## 2018-03-04 DIAGNOSIS — K625 Hemorrhage of anus and rectum: Secondary | ICD-10-CM | POA: Diagnosis present

## 2018-03-04 DIAGNOSIS — Z66 Do not resuscitate: Secondary | ICD-10-CM | POA: Diagnosis present

## 2018-03-04 DIAGNOSIS — C8310 Mantle cell lymphoma, unspecified site: Secondary | ICD-10-CM | POA: Diagnosis not present

## 2018-03-04 DIAGNOSIS — Z9842 Cataract extraction status, left eye: Secondary | ICD-10-CM

## 2018-03-04 DIAGNOSIS — Z6822 Body mass index (BMI) 22.0-22.9, adult: Secondary | ICD-10-CM

## 2018-03-04 DIAGNOSIS — Z79899 Other long term (current) drug therapy: Secondary | ICD-10-CM

## 2018-03-04 DIAGNOSIS — C787 Secondary malignant neoplasm of liver and intrahepatic bile duct: Secondary | ICD-10-CM | POA: Diagnosis present

## 2018-03-04 DIAGNOSIS — C7800 Secondary malignant neoplasm of unspecified lung: Secondary | ICD-10-CM | POA: Diagnosis present

## 2018-03-04 DIAGNOSIS — G893 Neoplasm related pain (acute) (chronic): Secondary | ICD-10-CM | POA: Diagnosis present

## 2018-03-04 DIAGNOSIS — E440 Moderate protein-calorie malnutrition: Secondary | ICD-10-CM

## 2018-03-04 DIAGNOSIS — E872 Acidosis: Secondary | ICD-10-CM | POA: Diagnosis present

## 2018-03-04 DIAGNOSIS — R64 Cachexia: Secondary | ICD-10-CM | POA: Diagnosis present

## 2018-03-04 DIAGNOSIS — R1310 Dysphagia, unspecified: Secondary | ICD-10-CM | POA: Diagnosis present

## 2018-03-04 DIAGNOSIS — C189 Malignant neoplasm of colon, unspecified: Secondary | ICD-10-CM | POA: Diagnosis present

## 2018-03-04 DIAGNOSIS — H548 Legal blindness, as defined in USA: Secondary | ICD-10-CM | POA: Diagnosis present

## 2018-03-04 DIAGNOSIS — C799 Secondary malignant neoplasm of unspecified site: Secondary | ICD-10-CM | POA: Diagnosis present

## 2018-03-04 LAB — CBC WITH DIFFERENTIAL/PLATELET
BASOS ABS: 0 10*3/uL (ref 0.0–0.1)
Basophils Relative: 0 %
Eosinophils Absolute: 0.1 10*3/uL (ref 0.0–0.7)
Eosinophils Relative: 1 %
HEMATOCRIT: 41.2 % (ref 39.0–52.0)
Hemoglobin: 12.8 g/dL — ABNORMAL LOW (ref 13.0–17.0)
LYMPHS PCT: 7 %
Lymphs Abs: 0.5 10*3/uL — ABNORMAL LOW (ref 0.7–4.0)
MCH: 28.4 pg (ref 26.0–34.0)
MCHC: 31.1 g/dL (ref 30.0–36.0)
MCV: 91.6 fL (ref 78.0–100.0)
MONOS PCT: 11 %
Monocytes Absolute: 0.8 10*3/uL (ref 0.1–1.0)
NEUTROS ABS: 6.3 10*3/uL (ref 1.7–7.7)
Neutrophils Relative %: 81 %
Platelets: 193 10*3/uL (ref 150–400)
RBC: 4.5 MIL/uL (ref 4.22–5.81)
RDW: 15.7 % — ABNORMAL HIGH (ref 11.5–15.5)
WBC: 7.7 10*3/uL (ref 4.0–10.5)

## 2018-03-04 LAB — COMPREHENSIVE METABOLIC PANEL
ALBUMIN: 3 g/dL — AB (ref 3.5–5.0)
ALK PHOS: 78 U/L (ref 38–126)
ALT: 18 U/L (ref 17–63)
AST: 13 U/L — ABNORMAL LOW (ref 15–41)
Anion gap: 13 (ref 5–15)
BILIRUBIN TOTAL: 0.8 mg/dL (ref 0.3–1.2)
BUN: 14 mg/dL (ref 6–20)
CALCIUM: 8.8 mg/dL — AB (ref 8.9–10.3)
CO2: 26 mmol/L (ref 22–32)
Chloride: 99 mmol/L — ABNORMAL LOW (ref 101–111)
Creatinine, Ser: 0.77 mg/dL (ref 0.61–1.24)
GFR calc Af Amer: >60 mL/min (ref >60)
GFR calc non Af Amer: >60 mL/min (ref >60)
GLUCOSE: 131 mg/dL — AB (ref 65–99)
Potassium: 3.6 mmol/L (ref 3.5–5.1)
Sodium: 138 mmol/L (ref 135–145)
TOTAL PROTEIN: 5.4 g/dL — AB (ref 6.5–8.1)

## 2018-03-04 LAB — URINALYSIS, ROUTINE W REFLEX MICROSCOPIC
BACTERIA UA: NONE SEEN
Bilirubin Urine: NEGATIVE
Glucose, UA: NEGATIVE mg/dL
KETONES UR: 5 mg/dL — AB
Leukocytes, UA: NEGATIVE
NITRITE: NEGATIVE
PROTEIN: 30 mg/dL — AB
Specific Gravity, Urine: 1.026 (ref 1.005–1.030)
pH: 6 (ref 5.0–8.0)

## 2018-03-04 LAB — BRAIN NATRIURETIC PEPTIDE: B Natriuretic Peptide: 16.8 pg/mL (ref 0.0–100.0)

## 2018-03-04 LAB — TROPONIN I: Troponin I: <0.03 ng/mL (ref <0.03)

## 2018-03-04 MED ORDER — POTASSIUM CHLORIDE IN NACL 20-0.9 MEQ/L-% IV SOLN
INTRAVENOUS | Status: DC
  Administered 2018-03-04: via INTRAVENOUS
  Filled 2018-03-04 (×2): qty 1000

## 2018-03-04 MED ORDER — ONDANSETRON HCL 4 MG PO TABS
4.0000 mg | ORAL_TABLET | Freq: Four times a day (QID) | ORAL | Status: DC | PRN
  Administered 2018-03-07: 4 mg via ORAL
  Filled 2018-03-04: qty 1

## 2018-03-04 MED ORDER — SODIUM CHLORIDE 0.9% FLUSH
10.0000 mL | INTRAVENOUS | Status: DC | PRN
  Administered 2018-03-08 – 2018-03-19 (×4): 10 mL
  Filled 2018-03-04 (×4): qty 40

## 2018-03-04 MED ORDER — ENSURE ENLIVE PO LIQD
237.0000 mL | Freq: Two times a day (BID) | ORAL | Status: DC
  Administered 2018-03-06 – 2018-03-10 (×6): 237 mL via ORAL

## 2018-03-04 MED ORDER — IBRUTINIB 560 MG PO TABS
560.0000 mg | ORAL_TABLET | Freq: Every day | ORAL | Status: DC
  Administered 2018-03-11 – 2018-03-14 (×4): 560 mg via ORAL

## 2018-03-04 MED ORDER — ORAL CARE MOUTH RINSE
15.0000 mL | Freq: Two times a day (BID) | OROMUCOSAL | Status: DC
  Administered 2018-03-05 – 2018-03-28 (×8): 15 mL via OROMUCOSAL

## 2018-03-04 MED ORDER — CHLORHEXIDINE GLUCONATE 0.12 % MT SOLN
15.0000 mL | Freq: Two times a day (BID) | OROMUCOSAL | Status: DC
  Administered 2018-03-04 – 2018-03-25 (×21): 15 mL via OROMUCOSAL
  Filled 2018-03-04 (×34): qty 15

## 2018-03-04 MED ORDER — SODIUM CHLORIDE 0.9 % IV BOLUS
500.0000 mL | Freq: Once | INTRAVENOUS | Status: AC
  Administered 2018-03-04: 500 mL via INTRAVENOUS

## 2018-03-04 MED ORDER — ONDANSETRON HCL 4 MG/2ML IJ SOLN
4.0000 mg | Freq: Four times a day (QID) | INTRAMUSCULAR | Status: DC | PRN
  Administered 2018-03-06 – 2018-03-12 (×6): 4 mg via INTRAVENOUS
  Filled 2018-03-04 (×5): qty 2

## 2018-03-04 NOTE — Telephone Encounter (Signed)
Pt's daughter called & left VM stating that pt 'is extremely sick and can't come to his lab appt today, can he be seen by the doctor today?'  Pt's daughter then came to [place] and asked to be seen by Dawna Part for MD Irene Limbo.  From description at this time of pt's symptoms he needs to go to the ED.  Samantha RN speaking with daughter now.

## 2018-03-04 NOTE — Telephone Encounter (Signed)
Received phone call from [place], [facility] RN taking care of pt. Concern that pt voiced increased dizziness and orthostatic BP along with hypotension. BP 138/90 sitting and 112/80 standing. NSR 108, which was higher than [facility] RN had ever seen it. Pt nauseous and having trouble eating or drinking. Pt denied fever or chills and no chest discomfort at the time, but some noted in the AM. Pt fatigued and weakness has progressed. Explained to the pt that he needs to stop taking venetoclax at this time. Pt in agreement. Also highly encouraged pt to go the the ED as this RN expressed increased concern from the doctor and nurse for the pt's ability to care for self over the next few days with worsening symptoms. Pt agreeable to go to the ED and be seen. [facility] RN removing fluid at that time. Verbalized intent to help pt get to the ED after procedure completed.

## 2018-03-04 NOTE — H&P (Addendum)
History and Physical    VYRON FRONCZAK TDV:[id number] DOB: Nov 13, 1955 DOA: 02/05/2018  PCP: Patient, No Pcp Per   Patient coming from: Home  Chief Complaint: Dizziness, SOB with exertion  HPI: Kristopher Johnson is a 62 y.o. 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  Brought to the ED via EMS with complaints of dizziness on standing of  ~ 4 days duration, such that patient was not able to get out of bed today.  Shortness of breath with exertion- more chronic, over the past month, with associated orthopnea.  Patient denies fever cough or chest pains, no leg swelling. 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  Patient is on venetoclax for his lymphoma.  Initial dose was 20 mg, with gradual increase in dose every week.  Yesterday patient took 100 mg.  Patient states since he started taking the medication he has gotten progressively worse, and he feels his current symptoms especially dizziness is related to increased dose of venetoclax. Patient reports nausea poor p.o. intake since starting venetoclax.  ED Course: Heart rate 80s to 114, temperature 97.4, respiratory rate 17- 27, O2 sats greater than 91% on room air.  Blood pressure systolic 062I to 948N.  Two-view chest x-ray large left-sided pleural effusion.  WBC 7.7, globin stable 12.8, creatinine at baseline 0.7. BNP- 16.  Trop X 1 - neg. Orthostatic vitals positive-heart rate.  Patient was given 573ml Ns bolus in the ED. Hospitalist was called to admit for recurrent pleural effusion and dizziness  Review of Systems: As per HPI otherwise 10 point review of systems negative.    Past Medical History:  Diagnosis Date  . Abscess of arm, right 10/27/2015  . Lymphoma of lymph nodes of multiple sites ([place]) 10/27/2015  . 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 Shortness of breath 10/27/2015    Past Surgical History:  Procedure Laterality Date  . Cataract surgery Bilateral    Patient notes she has had bilateral Surgery in 1998 and 99  . FLEXIBLE SIGMOIDOSCOPY N/A 03/01/2017   Procedure: FLEXIBLE SIGMOIDOSCOPY;  Surgeon: Manus Gunning, MD;  Location: [facility];  Service: Gastroenterology;  Laterality: N/A;  . IR GUIDED [place]  02/20/2018  . TONSILLECTOMY     about 62years old     reports that he has been smoking cigarettes.  He has a 16.50 pack-year smoking history. He has never used smokeless tobacco. He reports that he does not drink alcohol or use drugs.  No Known Allergies  Family History  Problem Relation Age of Onset  . Colon cancer Neg Hx     Prior to Admission medications   Medication Sig Start Date End Date Taking? Authorizing Provider  docusate sodium (COLACE) 100 MG capsule Take 100 mg by mouth daily as needed for mild constipation.   Yes [provider]  Ibrutinib 560 MG TABS Take 560 mg by mouth daily. Take with a full glass of water at approximately the same time daily, maintain hydration. 11/28/17  Yes Brunetta Genera, MD  prochlorperazine (COMPAZINE) 5 MG tablet Take 2  tablets (10 mg total) by mouth every 6 (six) hours as needed for nausea or vomiting. 02/24/18  Yes Tanner, Lyndon Code., PA-C  traMADol (ULTRAM) 50 MG tablet Take 2 tablets (100 mg total) by mouth every 4 (four) hours as needed for moderate pain or severe pain. 02/12/18  Yes Hayden Pedro, PA-C  allopurinol (ZYLOPRIM) 300 MG tablet Take 1 tablet (300 mg total) by mouth daily. Patient not taking: Reported on 02/18/2018 02/13/18   Brunetta Genera, MD   dexamethasone (DECADRON) 4 MG tablet Take 1 tablet (4 mg total) by mouth daily. Patient not taking: Reported on 03/03/2018 02/24/18   Brunetta Genera, MD  pantoprazole (PROTONIX) 40 MG tablet Take 1 tablet (40 mg total) by mouth daily. Patient not taking: Reported on 02/06/2018 01/31/18   Hosie Poisson, MD  senna-docusate (SENNA S) 8.6-50 MG tablet Take 2 tablets by mouth at bedtime. Patient not taking: Reported on 02/18/2018 02/06/18   Brunetta Genera, MD  Venetoclax 10 & 50 & 100 MG TBPK Take 20 mg by mouth daily. WUJW1: Take 50mg  daily x 7 days. XBJY7: Take 100mg  daily x 7 days. WGNF6: Take 200mg  daily x 7 days. Patient not taking: Reported on 02/24/2018 02/14/18   Brunetta Genera, MD    Physical Exam: Vitals:   02/24/2018 1700 02/09/2018 1730 02/22/2018 1800 02/16/2018 1830  BP: 134/70 123/69 130/68 (!) 122/59  Pulse: 79 88 89 87  Resp: 17 19 19 18   Temp:      TempSrc:      SpO2: 92% 95% 94% 92%    Constitutional: NAD, calm, comfortable Vitals:   02/21/2018 1700 02/18/2018 1730 02/20/2018 1800 02/07/2018 1830  BP: 134/70 123/69 130/68 (!) 122/59  Pulse: 79 88 89 87  Resp: 17 19 19 18   Temp:      TempSrc:      SpO2: 92% 95% 94% 92%   Eyes: PERRL, lids and conjunctivae normal ENMT: Mucous membranes are moist. Posterior pharynx clear of any exudate or lesions.Normal dentition.  Neck: normal, supple, no masses, no thyromegaly Respiratory: Sitting upright in bed, reduced breath sounds left lung, Normal respiratory effort. No accessory muscle use.  Cardiovascular: Mild tachycardia, but regular rate and rhythm, no murmurs / rubs / gallops. No extremity edema. 2+ pedal pulses. No carotid bruits.  Abdomen: no tenderness, no masses palpated. No hepatosplenomegaly. Bowel sounds positive.  Musculoskeletal: no clubbing / cyanosis. No joint deformity upper and lower extremities. Good ROM, no contractures. Normal muscle tone.  Skin: no rashes, lesions, ulcers. No induration Neurologic: CN 2-12  grossly intact. Strength 5/5 in all 4.  Psychiatric: Normal judgment and insight. Alert and oriented x 3. Normal mood.   Labs on Admission: I have personally reviewed following labs and imaging studies  CBC: Recent Labs  Lab 02/26/18 0909 02/21/2018 1619  WBC 9.8 7.7  NEUTROABS 8.4* 6.3  HGB 13.0 12.8*  HCT 40.7 41.2  MCV 89.5 91.6  PLT 167 213   Basic Metabolic Panel: Recent Labs  Lab 02/26/18 0909 02/25/2018 1619  NA 139 138  K 4.6 3.6  CL 100 99*  CO2 30* 26  GLUCOSE 175* 131*  BUN 12 14  CREATININE 0.92 0.77  CALCIUM 9.5 8.8*  PHOS 3.7  --    Liver Function Tests: Recent Labs  Lab 02/26/18 0909 02/15/2018 1619  AST 10 13*  ALT 14 18  ALKPHOS 106 78  BILITOT 0.6 0.8  PROT 5.6* 5.4*  ALBUMIN 2.9* 3.0*   Cardiac Enzymes: Recent  Labs  Lab 03/03/2018 1619  TROPONINI <0.03   Urine analysis:    Component Value Date/Time   COLORURINE YELLOW 03/02/2018 1620   APPEARANCEUR CLEAR 03/02/2018 1620   LABSPEC 1.026 02/17/2018 1620   PHURINE 6.0 02/12/2018 1620   GLUCOSEU NEGATIVE 02/08/2018 1620   HGBUR SMALL (A) 03/02/2018 1620   BILIRUBINUR NEGATIVE 02/25/2018 1620   KETONESUR 5 (A) 03/06/2018 1620   PROTEINUR 30 (A) 02/22/2018 1620   NITRITE NEGATIVE 03/02/2018 1620   LEUKOCYTESUR NEGATIVE 02/13/2018 1620    Radiological Exams on Admission: Dg Chest 2 View  Result Date: 02/27/2018 CLINICAL DATA:  History of lung carcinoma with shortness of Breath EXAM: CHEST - 2 VIEW COMPARISON:  02/20/2018 PleurX, 02/14/2018 FINDINGS: Cardiac shadow is obscured by a significantly enlarged left-sided pleural effusion. A left PleurX catheter is noted in place. Minimal left lung aeration is noted. Right chest wall port is seen in satisfactory position. Right lung is clear. No bony abnormality is noted. IMPRESSION: Enlarging left-sided pleural effusion. Electronically Signed   By: Inez Catalina M.D.   On: 03/07/2018 17:00    EKG: Nonspecific T wave changes V5 V6.  QTc  394.  Assessment/Plan Principal Problem:   Pleural effusion, left Active Problems:   Mantle cell lymphoma of lymph nodes of multiple regions [facility])   Legally blind   Port-A-Cath in place   Malignant pleural effusion  Malignant left sided pleural effusion- with SOB, left sided pleurex cath inplace. 1L drained today.  Chest x-ray -recommendation of left pleural fluid. On my view- ?loculation.  Differentials- rapid reaccumulation Vs Loculation Vs not enough fluid removed.  Last pleural fluid culture 01/27/2018- for growth. -Decubitus Chest xray-admit for loculation -Recommend consulting IR a.m., will defer further imaging for now  -May benefit from palliative care consult inpatient - NPO midnight pending IR eval  Dizziness-with positive orthostatic vitals - Heart rate. Likely multifactorial- 2/2 rapid recommendation of pleural fluid with intravascular contraction, side effect of venetoclax-dizziness and 12% of patients, also nausea with poor p.o. Intake. Cr-table at 0.7. -Gentle hydration Ns + 20Kcl 75cc/hr X 1 day  SOB- no chest pain.  Likely secondary to recommendation of pleural fluid.  EKG shows nonspecific T wave abnormalities in V5 V6.  Troponin x1- negative. -Trops X 2 - EKG a.m  Mantle cell lymphoma-currently on ibrutinib and venetoclax.  Poor p.o. Intake, nausea. -Continue ibrutinib,  - hold venetoclax at this time  Legally blind  DVT prophylaxis: SCDs for now, pending IR eval. Code Status: DNR confirmed with patient at bedside, daughter present Family Communication: Spoke with daughter-Christine at bedside. But CousinAugustin Coupe would be primary Media planner. Patient has not addressed HCPOA.  Disposition Plan: Per rounding team Consults called: None, consider IR consult a.m, palliative care Admission status: Obs, tele   [name] MD [facility] Pager [phone] From 6PM-2AM.  Otherwise please contact night-coverage [email or web address] Password  [facility]  02/26/2018, 7:29 PM

## 2018-03-04 NOTE — Progress Notes (Signed)
Patient refusing bed alarm. Explained to patient the risk of falling and that if he refuses the bed alarm and falls that would be his liability. Patient acknowledges risk and refuses to have bed alarm set.

## 2018-03-04 NOTE — Progress Notes (Signed)
Pt gave permission for his daughter to remain in the room while the nursing admission history was being done. Lucius Conn BSN, RN-BC Admissions RN 02/05/2018 7:16 PM

## 2018-03-04 NOTE — ED Notes (Signed)
ED TO INPATIENT HANDOFF REPORT  Name/Age/Gender Kristopher Johnson 62 y.o. male  Code Status Code Status History    Date Active Date Inactive Code Status Order ID Comments User Context   02/03/2018 1937 02/04/2018 1553 DNR [id number]  [name], MD ED   01/27/2018 1832 01/30/2018 1603 Full Code [id number]  [name], MD Inpatient   10/25/2017 1628 11/01/2017 1610 Full Code [id number]  [name], MD Inpatient   03/01/2017 0123 03/02/2017 1336 Full Code [id number]  [name], MD ED   10/24/2015 2317 10/25/2015 2051 Full Code [id number]  [name], MD Inpatient    Questions for Most Recent Historical Code Status (Order [id number])    Question Answer Comment   In the event of cardiac or respiratory ARREST Do not call a "code blue"    In the event of cardiac or respiratory ARREST Do not perform Intubation, CPR, defibrillation or ACLS    In the event of cardiac or respiratory ARREST Use medication by any route, position, wound care, and other measures to relive pain and suffering. May use oxygen, suction and manual treatment of airway obstruction as needed for comfort.       Home/SNF/Other Home  Chief Complaint diff breathing  Level of Care/Admitting Diagnosis ED Disposition    ED Disposition Condition Comment   Admit  Hospital Area: [place] [[id number]]  Level of Care: Telemetry [5]  Admit to tele based on following criteria: Other see comments  Comments: SOB  Diagnosis: Pleural effusion, left [144315]  Admitting Physician: [name] [id number]  Attending Physician: [name] Nessa.Cuff  PT Class (Do Not Modify): Observation [104]  PT Acc Code (Do Not Modify): Observation [10022]       Medical History Past Medical History:  Diagnosis Date  . Abscess of arm, right 10/27/2015  . Lymphoma of lymph nodes of multiple sites ([place]) 10/27/2015  . Retinitis pigmentosa    Patient notes that he has been declared legally blind since  1983 and is on Social Security disability  . Shortness of breath 10/27/2015    Allergies No Known Allergies  IV Location/Drains/Wounds Patient Lines/Drains/Airways Status   Active Line/Drains/Airways    Name:   Placement date:   Placement time:   Site:   Days:   Implanted Port 11/10/15 Right Chest   11/10/15    -    Chest   845   Chest Tube 1 Left;Posterior;Lateral Pleural 15.5 Fr.   02/20/18    1431    Pleural   12          Labs/Imaging Results for orders placed or performed during the hospital encounter of 02/20/2018 (from the past 48 hour(s))  CBC with Differential/Platelet     Status: Abnormal   Collection Time: 02/22/2018  4:19 PM  Result Value Ref Range   WBC 7.7 4.0 - 10.5 K/uL   RBC 4.50 4.22 - 5.81 MIL/uL   Hemoglobin 12.8 (L) 13.0 - 17.0 g/dL   HCT 41.2 39.0 - 52.0 %   MCV 91.6 78.0 - 100.0 fL   MCH 28.4 26.0 - 34.0 pg   MCHC 31.1 30.0 - 36.0 g/dL   RDW 15.7 (H) 11.5 - 15.5 %   Platelets 193 150 - 400 K/uL   Neutrophils Relative % 81 %   Lymphocytes Relative 7 %   Monocytes Relative 11 %   Eosinophils Relative 1 %   Basophils Relative 0 %   Neutro Abs 6.3 1.7 - 7.7 K/uL  Lymphs Abs 0.5 (L) 0.7 - 4.0 K/uL   Monocytes Absolute 0.8 0.1 - 1.0 K/uL   Eosinophils Absolute 0.1 0.0 - 0.7 K/uL   Basophils Absolute 0.0 0.0 - 0.1 K/uL   Smear Review MORPHOLOGY UNREMARKABLE     Comment: Performed at [facility], [place] [address] [place], [place] [address]  Comprehensive metabolic panel     Status: Abnormal   Collection Time: 02/11/2018  4:19 PM  Result Value Ref Range   Sodium 138 135 - 145 mmol/L   Potassium 3.6 3.5 - 5.1 mmol/L   Chloride 99 (L) 101 - 111 mmol/L   CO2 26 22 - 32 mmol/L   Glucose, Bld 131 (H) 65 - 99 mg/dL   BUN 14 6 - 20 mg/dL   Creatinine, Ser 0.77 0.61 - 1.24 mg/dL   Calcium 8.8 (L) 8.9 - 10.3 mg/dL   Total Protein 5.4 (L) 6.5 - 8.1 g/dL   Albumin 3.0 (L) 3.5 - 5.0 g/dL   AST 13 (L) 15 - 41 U/L   ALT 18 17 - 63 U/L   Alkaline  Phosphatase 78 38 - 126 U/L   Total Bilirubin 0.8 0.3 - 1.2 mg/dL   GFR calc non Af Amer >60 >60 mL/min   GFR calc Af Amer >60 >60 mL/min    Comment: (NOTE) The eGFR has been calculated using the CKD EPI equation. This calculation has not been validated in all clinical situations. eGFR's persistently <60 mL/min signify possible Chronic Kidney Disease.    Anion gap 13 5 - 15    Comment: Performed at [facility], [place] [address] [place], [place] [address]  Troponin I     Status: None   Collection Time: 03/01/2018  4:19 PM  Result Value Ref Range   Troponin I <0.03 <0.03 ng/mL    Comment: Performed at [facility], [place] [address] [place], [place] [address]  Brain natriuretic peptide     Status: None   Collection Time: 03/05/2018  4:20 PM  Result Value Ref Range   B Natriuretic Peptide 16.8 0.0 - 100.0 pg/mL    Comment: Performed at [facility], [place] [address], [place], [place] [address]  Urinalysis, Routine w reflex microscopic     Status: Abnormal   Collection Time: 02/09/2018  4:20 PM  Result Value Ref Range   Color, Urine YELLOW YELLOW   APPearance CLEAR CLEAR   Specific Gravity, Urine 1.026 1.005 - 1.030   pH 6.0 5.0 - 8.0   Glucose, UA NEGATIVE NEGATIVE mg/dL   Hgb urine dipstick SMALL (A) NEGATIVE   Bilirubin Urine NEGATIVE NEGATIVE   Ketones, ur 5 (A) NEGATIVE mg/dL   Protein, ur 30 (A) NEGATIVE mg/dL   Nitrite NEGATIVE NEGATIVE   Leukocytes, UA NEGATIVE NEGATIVE   RBC / HPF 6-10 0 - 5 RBC/hpf   WBC, UA 0-5 0 - 5 WBC/hpf   Bacteria, UA NONE SEEN NONE SEEN   Squamous Epithelial / LPF 0-5 0 - 5   Mucus PRESENT     Comment: Performed at [facility], [place] [address], [place], [place] [address]   Dg Chest 2 View  Result Date: 02/14/2018 CLINICAL DATA:  History of lung carcinoma with shortness of Breath EXAM: CHEST - 2 VIEW COMPARISON:  02/20/2018 PleurX, 02/14/2018 FINDINGS: Cardiac shadow is obscured  by a significantly enlarged left-sided pleural effusion. A left PleurX catheter is noted in place. Minimal left lung aeration is noted. Right chest  wall port is seen in satisfactory position. Right lung is clear. No bony abnormality is noted. IMPRESSION: Enlarging left-sided pleural effusion. Electronically Signed   By: Inez Catalina M.D.   On: 02/17/2018 17:00    Pending Labs Unresulted Labs (From admission, onward)   Start     Ordered   Signed and Held  HIV antibody (Routine Testing)  Once,   R     Signed and Held      Vitals/Pain Today's Vitals   02/12/2018 1730 02/14/2018 1800 02/20/2018 1830 02/07/2018 1844  BP: 123/69 130/68 (!) 122/59   Pulse: 88 89 87   Resp: 19 19 18    Temp:      TempSrc:      SpO2: 95% 94% 92%   PainSc:    0-No pain    Isolation Precautions No active isolations  Medications Medications  sodium chloride 0.9 % bolus 500 mL (0 mLs Intravenous Stopped 02/13/2018 1720)    Mobility walks

## 2018-03-04 NOTE — ED Triage Notes (Signed)
Pt EMS report pt is coming from home.  Pt was visited by home health nurse to drain his chest tube. Pt's O2 dropped to 86% while ambulating a short distance in his home.  Pt took Veneoclax for chemo yesterday. Pt reports he doesn't like the side effects of the chemo drug.  Pt reports nausea and dizziness since drug administration. Pt has lung CA. EMS reports pt's lungs are bilaterally diminished but that is baseline for the pt.   EMS VS: BP: 120/70, HR: 94, RR: 16, 96% 3L [place]

## 2018-03-04 NOTE — Telephone Encounter (Signed)
Pt daughter, Altha Harm, came to [facility] lobby to notify Dr. Grier Mitts RN that pt unable to come in for lab appt today. Started 100mg  venetoclax last night and pt is extremely dizzy and fatigued this AM. Pt unable to get out of bed according to pt daughter. Highly encouraged pt daughter to have pt go to the ED to tx symptoms. Pt refusing to go to the ED. Plan to f/u with Dr. Irene Limbo on Thursday. Discussed situation with Dr. Irene Limbo. Recommendation for pt to go to the ED. Concern for pt safety and for pt to stop taking venetoclax immediately. Interest of physician to see pt in clinic sooner. Left VM on pt phone to notify of recommendation and explained importance of pt being seen and stopping venetoclax.

## 2018-03-04 NOTE — ED Provider Notes (Signed)
[facility] DEPT Provider Note   CSN: [id number] Arrival date & time: 02/27/2018  1410     History   Chief Complaint Chief Complaint  Patient presents with  . Shortness of Breath  . Nausea    HPI Kristopher Johnson is a 62 y.o. male.  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  Recently had pleural cath placed.  States he has a home health nurse that comes out every other day and removes 1 L of fluid.  States his home health nurse came out today and remove 1 L.  He also had his chemotherapy drug dose increased and took the first increased dose yesterday evening.  States his blood pressure was elevated after removal of the pleural fluid today to a systolic of 712.  Blood pressure dropped significantly when he stood.  States he was lightheaded.  This is since resolved.  Continues to have dyspnea with exertion but states this is unchanged.  No new fever, chills or cough.  He has some mild lower extremity swelling bilaterally.  Patient states that yesterday evening after taking the chemotherapy he had brief diaphoresis and nausea.  States this is since completely resolved. Past Medical History:  Diagnosis Date  . Abscess of arm, right 10/27/2015  . Lymphoma of lymph nodes of multiple sites ([place]) 10/27/2015  . 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 Shortness of breath 10/27/2015    Patient Active Problem List   Diagnosis Date Noted  . Pleural effusion, left 02/10/2018  . Recurrent pleural effusion on left 02/03/2018  . Protein-calorie malnutrition, severe 10/29/2017  . 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 Large pleural effusion 10/25/2017  . Counseling regarding advanced care planning and goals of care 03/13/2017  . Rectal mass 03/02/2017  . Colon cancer metastasized to liver and lung(HCC)  03/02/2017  . Rectal bleeding 02/28/2017  . Port-A-Cath in place 01/30/2016  . Skin lesion of chest wall 12/19/2015  . Shingles 12/19/2015  . Rash 11/28/2015  . 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 Protein-calorie malnutrition ([place]) 11/01/2015  . Abscess of arm, right 10/27/2015  . Lymphoma of lymph nodes of multiple sites ([place]) 10/27/2015  . Shortness of breath 10/27/2015  . Shortness of breath dyspnea 10/27/2015  . Hypoalbuminemia 10/25/2015  . Elevated lipase 10/25/2015  . Inguinal hernia 10/25/2015  . Urinary retention 10/25/2015  . Abdominal pain   . Lymphadenopathy 10/24/2015    Past Surgical History:  Procedure Laterality Date  . Cataract surgery Bilateral    Patient notes she has had bilateral Surgery in 1998 and 99  . FLEXIBLE SIGMOIDOSCOPY N/A 03/01/2017   Procedure: FLEXIBLE SIGMOIDOSCOPY;  Surgeon: Manus Gunning, MD;  Location: [facility];  Service: Gastroenterology;  Laterality: N/A;  . IR GUIDED [place]  02/20/2018  . TONSILLECTOMY     about 62years old        Home Medications    Prior to Admission medications   Medication Sig Start Date End Date Taking? Authorizing Provider  docusate sodium (COLACE) 100 MG capsule Take 100 mg by mouth daily as needed for mild constipation.   Yes [provider]  Ibrutinib 560 MG TABS Take 560 mg by mouth daily. Take with a full glass of water at approximately the same  time daily, maintain hydration. 11/28/17  Yes Brunetta Genera, MD  prochlorperazine (COMPAZINE) 5 MG tablet Take 2 tablets (10 mg total) by mouth every 6 (six) hours as needed for nausea or vomiting. 02/24/18  Yes Tanner, Lyndon Code., PA-C  traMADol (ULTRAM) 50 MG tablet Take 2 tablets (100 mg total) by mouth every 4 (four) hours as needed for moderate pain or severe pain. 02/12/18  Yes Hayden Pedro, PA-C  allopurinol (ZYLOPRIM) 300 MG tablet Take 1  tablet (300 mg total) by mouth daily. Patient not taking: Reported on 02/18/2018 02/13/18   Brunetta Genera, MD  dexamethasone (DECADRON) 4 MG tablet Take 1 tablet (4 mg total) by mouth daily. Patient not taking: Reported on 03/02/2018 02/24/18   Brunetta Genera, MD  pantoprazole (PROTONIX) 40 MG tablet Take 1 tablet (40 mg total) by mouth daily. Patient not taking: Reported on 02/06/2018 01/31/18   Hosie Poisson, MD  senna-docusate (SENNA S) 8.6-50 MG tablet Take 2 tablets by mouth at bedtime. Patient not taking: Reported on 02/18/2018 02/06/18   Brunetta Genera, MD  Venetoclax 10 & 50 & 100 MG TBPK Take 20 mg by mouth daily. XBJY7: Take 50mg  daily x 7 days. WGNF6: Take 100mg  daily x 7 days. OZHY8: Take 200mg  daily x 7 days. Patient not taking: Reported on 02/24/2018 02/14/18   Brunetta Genera, MD    Family History Family History  Problem Relation Age of Onset  . Colon cancer Neg Hx     Social History Social History   Tobacco Use  . Smoking status: Current Every Day Smoker    Packs/day: 0.50    Years: 33.00    Pack years: 16.50    Types: Cigarettes  . Smokeless tobacco: Never Used  Substance Use Topics  . Alcohol use: No  . Drug use: No     Allergies   Patient has no known allergies.   Review of Systems Review of Systems  Constitutional: Positive for diaphoresis and fatigue. Negative for chills and fever.  HENT: Negative for congestion, sore throat and trouble swallowing.   Eyes: Negative for visual disturbance.  Respiratory: Positive for shortness of breath. Negative for cough.   Cardiovascular: Positive for leg swelling. Negative for chest pain and palpitations.  Gastrointestinal: Positive for nausea. Negative for abdominal pain, diarrhea and vomiting.  Genitourinary: Negative for dysuria, flank pain, frequency and hematuria.  Musculoskeletal: Negative for back pain, myalgias, neck pain and neck stiffness.  Skin: Negative for rash and wound.  Neurological:  Positive for dizziness and light-headedness. Negative for tremors, syncope, weakness, numbness and headaches.  All other systems reviewed and are negative.    Physical Exam Updated Vital Signs BP (!) 122/59   Pulse 87   Temp (!) 97.4 F (36.3 C) (Oral)   Resp 18   SpO2 92%   Physical Exam  Constitutional: He is oriented to person, place, and time. He appears well-developed and well-nourished. No distress.  HENT:  Head: Normocephalic and atraumatic.  Mouth/Throat: Oropharynx is clear and moist. No oropharyngeal exudate.  Eyes: EOM are normal.  Irregular left pupil  Neck: Normal range of motion. Neck supple.  Cardiovascular: Normal rate and regular rhythm. Exam reveals no gallop and no friction rub.  No murmur heard. Pulmonary/Chest: Effort normal.  Diminished breath sounds in the left lung base.  There is a Port-A-Cath in the left lateral chest with dressing in place.  No tenderness at site.  Abdominal: Soft. Bowel sounds are normal. There is no tenderness.  There is no rebound and no guarding.  Musculoskeletal: Normal range of motion. He exhibits edema. He exhibits no tenderness.  1+ bilateral lower extremity pitting edema.  No calf swelling or asymmetry.  Lymphadenopathy:    He has no cervical adenopathy.  Neurological: He is alert and oriented to person, place, and time.  Moves all extremities without focal deficit.  Sensation fully intact.  Skin: Skin is warm and dry. Capillary refill takes less than 2 seconds. No rash noted. He is not diaphoretic. No erythema.  Psychiatric: He has a normal mood and affect. His behavior is normal.  Nursing note and vitals reviewed.    ED Treatments / Results  Labs (all labs ordered are listed, but only abnormal results are displayed) Labs Reviewed  CBC WITH DIFFERENTIAL/PLATELET - Abnormal; Notable for the following components:      Result Value   Hemoglobin 12.8 (*)    RDW 15.7 (*)    Lymphs Abs 0.5 (*)    All other components  within normal limits  COMPREHENSIVE METABOLIC PANEL - Abnormal; Notable for the following components:   Chloride 99 (*)    Glucose, Bld 131 (*)    Calcium 8.8 (*)    Total Protein 5.4 (*)    Albumin 3.0 (*)    AST 13 (*)    All other components within normal limits  URINALYSIS, ROUTINE W REFLEX MICROSCOPIC - Abnormal; Notable for the following components:   Hgb urine dipstick SMALL (*)    Ketones, ur 5 (*)    Protein, ur 30 (*)    All other components within normal limits  BRAIN NATRIURETIC PEPTIDE  TROPONIN I    EKG EKG Interpretation  Date/Time:  Tuesday Mar 04 2018 14:43:01 EDT Ventricular Rate:  79 PR Interval:    QRS Duration: 92 QT Interval:  343 QTC Calculation: 394 R Axis:   55 Text Interpretation:  Sinus rhythm Nonspecific T abnormalities, lateral leads Confirmed by Julianne Rice [phone]) on 02/19/2018 7:38:10 PM   Radiology Dg Chest 2 View  Result Date: 02/12/2018 CLINICAL DATA:  History of lung carcinoma with shortness of Breath EXAM: CHEST - 2 VIEW COMPARISON:  02/20/2018 PleurX, 02/14/2018 FINDINGS: Cardiac shadow is obscured by a significantly enlarged left-sided pleural effusion. A left PleurX catheter is noted in place. Minimal left lung aeration is noted. Right chest wall port is seen in satisfactory position. Right lung is clear. No bony abnormality is noted. IMPRESSION: Enlarging left-sided pleural effusion. Electronically Signed   By: Inez Catalina M.D.   On: 02/26/2018 17:00    Procedures Procedures (including critical care time)  Medications Ordered in ED Medications  sodium chloride 0.9 % bolus 500 mL (0 mLs Intravenous Stopped 02/18/2018 1720)     Initial Impression / Assessment and Plan / ED Course  I have reviewed the triage vital signs and the nursing notes.  Pertinent labs & imaging results that were available during my care of the patient were reviewed by me and considered in my medical decision making (see chart for details).    Current  large left-sided pleural effusion.  Also is orthostatic. Likely intravascularly dry.  Given IV fluids.  Discussed with hospitalist will see patient in the emergency department and admit.  Final Clinical Impressions(s) / ED Diagnoses   Final diagnoses:  Pleural effusion on left  Orthostasis    ED Discharge Orders    None       Julianne Rice, MD 02/10/2018 [phone]

## 2018-03-05 ENCOUNTER — Observation Stay (HOSPITAL_COMMUNITY): Payer: Medicare Other

## 2018-03-05 DIAGNOSIS — F17210 Nicotine dependence, cigarettes, uncomplicated: Secondary | ICD-10-CM | POA: Diagnosis not present

## 2018-03-05 DIAGNOSIS — C8313 Mantle cell lymphoma, intra-abdominal lymph nodes: Secondary | ICD-10-CM

## 2018-03-05 DIAGNOSIS — R110 Nausea: Secondary | ICD-10-CM

## 2018-03-05 DIAGNOSIS — R0602 Shortness of breath: Secondary | ICD-10-CM

## 2018-03-05 DIAGNOSIS — J90 Pleural effusion, not elsewhere classified: Secondary | ICD-10-CM

## 2018-03-05 DIAGNOSIS — R630 Anorexia: Secondary | ICD-10-CM

## 2018-03-05 DIAGNOSIS — R42 Dizziness and giddiness: Secondary | ICD-10-CM

## 2018-03-05 DIAGNOSIS — C8318 Mantle cell lymphoma, lymph nodes of multiple sites: Secondary | ICD-10-CM | POA: Diagnosis not present

## 2018-03-05 DIAGNOSIS — J910 Malignant pleural effusion: Secondary | ICD-10-CM | POA: Diagnosis not present

## 2018-03-05 DIAGNOSIS — H548 Legal blindness, as defined in USA: Secondary | ICD-10-CM | POA: Diagnosis not present

## 2018-03-05 DIAGNOSIS — H3552 Pigmentary retinal dystrophy: Secondary | ICD-10-CM | POA: Diagnosis not present

## 2018-03-05 DIAGNOSIS — R634 Abnormal weight loss: Secondary | ICD-10-CM

## 2018-03-05 DIAGNOSIS — E46 Unspecified protein-calorie malnutrition: Secondary | ICD-10-CM

## 2018-03-05 LAB — CBC WITH DIFFERENTIAL/PLATELET
BLASTS: 0 %
Band Neutrophils: 0 %
Basophils Absolute: 0 10*3/uL (ref 0.0–0.1)
Basophils Relative: 0 %
Eosinophils Absolute: 0 10*3/uL (ref 0.0–0.7)
Eosinophils Relative: 0 %
HEMATOCRIT: 40 % (ref 39.0–52.0)
Hemoglobin: 12.6 g/dL — ABNORMAL LOW (ref 13.0–17.0)
LYMPHS PCT: 8 %
Lymphs Abs: 0.6 10*3/uL — ABNORMAL LOW (ref 0.7–4.0)
MCH: 28.8 pg (ref 26.0–34.0)
MCHC: 31.5 g/dL (ref 30.0–36.0)
MCV: 91.3 fL (ref 78.0–100.0)
MONOS PCT: 9 %
Metamyelocytes Relative: 0 %
Monocytes Absolute: 0.7 10*3/uL (ref 0.1–1.0)
Myelocytes: 0 %
NEUTROS ABS: 6.6 10*3/uL (ref 1.7–7.7)
NEUTROS PCT: 83 %
OTHER: 0 %
Platelets: 194 10*3/uL (ref 150–400)
Promyelocytes Relative: 0 %
RBC: 4.38 MIL/uL (ref 4.22–5.81)
RDW: 15.8 % — AB (ref 11.5–15.5)
WBC: 7.9 10*3/uL (ref 4.0–10.5)
nRBC: 0 /100 WBC

## 2018-03-05 LAB — RENAL FUNCTION PANEL
ALBUMIN: 2.8 g/dL — AB (ref 3.5–5.0)
ANION GAP: 13 (ref 5–15)
BUN: 12 mg/dL (ref 6–20)
CO2: 24 mmol/L (ref 22–32)
Calcium: 8.6 mg/dL — ABNORMAL LOW (ref 8.9–10.3)
Chloride: 103 mmol/L (ref 101–111)
Creatinine, Ser: 0.83 mg/dL (ref 0.61–1.24)
GFR calc Af Amer: >60 mL/min (ref >60)
GLUCOSE: 116 mg/dL — AB (ref 65–99)
PHOSPHORUS: 2.5 mg/dL (ref 2.5–4.6)
Potassium: 3.9 mmol/L (ref 3.5–5.1)
Sodium: 140 mmol/L (ref 135–145)

## 2018-03-05 LAB — TROPONIN I

## 2018-03-05 LAB — URIC ACID: Uric Acid, Serum: 5.4 mg/dL (ref 4.4–7.6)

## 2018-03-05 LAB — LACTATE DEHYDROGENASE: LDH: 283 U/L — ABNORMAL HIGH (ref 98–192)

## 2018-03-05 MED ORDER — KETOROLAC TROMETHAMINE 30 MG/ML IJ SOLN
30.0000 mg | Freq: Once | INTRAMUSCULAR | Status: AC
  Administered 2018-03-05: 30 mg via INTRAVENOUS
  Filled 2018-03-05: qty 1

## 2018-03-05 MED ORDER — IPRATROPIUM-ALBUTEROL 0.5-2.5 (3) MG/3ML IN SOLN: 3.0000 mL | Freq: Four times a day (QID) | RESPIRATORY_TRACT | Status: DC

## 2018-03-05 MED ORDER — ADULT MULTIVITAMIN W/MINERALS CH
1.0000 | ORAL_TABLET | Freq: Every day | ORAL | Status: DC
  Administered 2018-03-06 – 2018-03-28 (×18): 1 via ORAL
  Filled 2018-03-05 (×22): qty 1

## 2018-03-05 MED ORDER — TRAMADOL HCL 50 MG PO TABS: 100.0000 mg | ORAL_TABLET | ORAL | Status: DC | PRN

## 2018-03-05 MED ORDER — ENOXAPARIN SODIUM 40 MG/0.4ML ~~LOC~~ SOLN
40.0000 mg | SUBCUTANEOUS | Status: DC
  Filled 2018-03-05 (×11): qty 0.4

## 2018-03-05 MED ORDER — TRAMADOL HCL 50 MG PO TABS
100.0000 mg | ORAL_TABLET | Freq: Four times a day (QID) | ORAL | Status: DC | PRN
Start: 2018-03-05 — End: 2018-03-23
  Administered 2018-03-05 – 2018-03-18 (×13): 100 mg via ORAL
  Filled 2018-03-05 (×14): qty 2

## 2018-03-05 NOTE — Care Management Note (Signed)
Case Management Note  Patient Details  Name: Kristopher Johnson MRN: [id number] Date of Birth: [birth year]  Subjective/Objective:62 y/o m admitted w/L pleural effusion. Fromhome. Active w/[facility]-HHRN-pleurx cath drain. Please order [facility] if appropriate @ d/c.                    Action/Plan:d/c home w/[facility]-HHRN-pleurx cath drain.   Expected Discharge Date:  (unknown)               Expected Discharge Plan:  [place]  In-House Referral:     Discharge planning Services  CM Consult  Post Acute Care Choice:  Home Health(Active w/[facility] HHRN-pleurx cath drain) Choice offered to:     DME Arranged:    DME Agency:     HH Arranged:  RN [place] Agency:  [facility]  Status of Service:  In process, will continue to follow  If discussed at Long Length of Stay Meetings, dates discussed:    Additional Comments:  Dessa Phi, RN 03/05/2018, 12:59 PM

## 2018-03-05 NOTE — Progress Notes (Signed)
Initial Nutrition Assessment  DOCUMENTATION CODES:   (Will assess for malnutrition at follow-up)  INTERVENTION:  - Continue Ensure Enlive BID, each supplement provides 350 kcal and 20 grams of protein - Will order daily multivitamin with minerals.  - Continue to encourage PO intakes.  - Will attempt NFPE at follow-up.   NUTRITION DIAGNOSIS:   Inadequate oral intake related to acute illness, decreased appetite, nausea as evidenced by per patient/family report, meal completion < 25%.  GOAL:   Patient will meet greater than or equal to 90% of their needs  MONITOR:   PO intake, Supplement acceptance, Weight trends, Labs  REASON FOR ASSESSMENT:   Malnutrition Screening Tool  ASSESSMENT:   62 y.o. 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  Brought to the ED via EMS with complaints of dizziness on standing of ~ 4 days duration, such that patient was not able to get out of bed on the day of admission. SOB with exertion--more chronic, over the past month, with associated orthopnea. Since Pleurex placement, 1L pleural fluid has been drained every 48 hours by home health nurse who comes to patient's house. Patient is on venetoclax for his lymphoma. Initial dose was 20 mg, with gradual increase in dose every week. The day before admission, patient took 100 mg.  Patient states since he started taking the medication he has gotten progressively worse, and he feels his current symptoms especially dizziness is related to increased dose of venetoclax. Patient reports nausea and poor p.o. intake since starting venetoclax.  BMI indicates overweight status. Diet advanced from NPO to Regular at 10:40 AM and tray was at bedside when RD visited. Pt had eaten 2-3 bites of a tuna salad sandwich and a few bites of orange sherbet. When RD introduced self and  asked pt how he was feeling he said "terrible." Patient did not seem up to having a conversation and was very appreciative when RD mentioned ability to stop back when he was feeling better.   No family/visitors present at this time to provide information. Per chart review, pt has lost 7 lbs (3.7% body weight) in the past 1 month. This is not significant for time frame.  Medications reviewed Labs reviewed from 5/28; Cl: 99 mmol/L, Ca: 8.8 mg/dL.   IVF: NS @ 75 mL/hr.      NUTRITION - FOCUSED PHYSICAL EXAM:  Unable to assess at this time.   Diet Order:   Diet Order           Diet regular Room service appropriate? Yes; Fluid consistency: Thin  Diet effective now          EDUCATION NEEDS:   No education needs have been identified at this time  Skin:  Skin Assessment: Reviewed RN Assessment  Last BM:  5/27  Height:   Ht Readings from Last 1 Encounters:  02/11/2018 5\' 9"  (1.753 m)    Weight:   Wt Readings from Last 1 Encounters:  02/15/2018 182 lb 5.1 oz (82.7 kg)    Ideal Body Weight:  72.73 kg  BMI:  Body mass index is 26.92 kg/m.  Estimated Nutritional Needs:   Kcal:  1062-6948 (26-28 kcal/kg)  Protein:  116-132 grams (1.4-1.6 grams/kg)  Fluid:  >/= 2.1 L/day      Jarome Matin, MS, RD, LDN, [facility] Inpatient Clinical Dietitian Pager # [phone] After hours/weekend pager # [phone]

## 2018-03-05 NOTE — Progress Notes (Signed)
HEMATOLOGY/ONCOLOGY INPATIENT PROGRESS NOTE  Date of Service: 03/05/2018  Inpatient Attending: .Alma Friendly, MD   SUBJECTIVE:  The pt reports that he felt very "wound up" and like he was hyperventilating after taking Venetoclax. He also felt quite nauseous and dizzy. He notes that he does not want to take Venetoclax anymore. He also notes diminished appetite for solid foods and that he is losing weight. He notes that he is mostly sedentary when at home as he develops SOB very suddenly after taking even a couple steps to the bathroom. He notes that additional fluid removal has not made him feel better.  The pt also notes that his BP has decreased.   On review of systems, pt reports SOB, weak appetite, losing weight, losing strength, some dizziness, and denies any other symptoms.    OBJECTIVE:  Mild respiratory distress  PHYSICAL EXAMINATION: . Vitals:   03/05/18 0541 03/05/18 1358 03/05/18 2048 03/06/18 0618  BP: (!) 115/59 106/68 134/87 115/76  Pulse: (!) 109 100 (!) 125 (!) 116  Resp: 20 18 (!) 24 20  Temp: 97.8 F (36.6 C) 98.3 F (36.8 C) 97.7 F (36.5 C) 97.8 F (36.6 C)  TempSrc: Oral Oral Oral Oral  SpO2: 96% 99% 98% 97%  Weight:      Height:       Filed Weights   02/12/2018 2055  Weight: 182 lb 5.1 oz (82.7 kg)   .Body mass index is 26.92 kg/m.  GENERAL:alert, in no acute distress and comfortable SKIN: skin color, texture, turgor are normal, no rashes or significant lesions EYES: normal, conjunctiva are pink and non-injected, sclera clear OROPHARYNX:no exudate, no erythema and lips, buccal mucosa, and tongue normal  NECK: supple, no JVD, thyroid normal size, non-tender, without nodularity LYMPH:  no palpable lymphadenopathy in the cervical, axillary or inguinal LUNGS: decreased breath sounds left lung fields HEART: regular rate & rhythm,  no murmurs and no lower extremity edema ABDOMEN: abdomen soft, non-tender, normoactive bowel sounds    Musculoskeletal: no cyanosis of digits and no clubbing  PSYCH: alert & oriented x 3 with fluent speech NEURO: no focal motor/sensory deficits  MEDICAL HISTORY:  Past Medical History:  Diagnosis Date  . Abscess of arm, right 10/27/2015  . Lymphoma of lymph nodes of multiple sites ([place]) 10/27/2015  . 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 Shortness of breath 10/27/2015    SURGICAL HISTORY: Past Surgical History:  Procedure Laterality Date  . Cataract surgery Bilateral    Patient notes she has had bilateral Surgery in 1998 and 99  . FLEXIBLE SIGMOIDOSCOPY N/A 03/01/2017   Procedure: FLEXIBLE SIGMOIDOSCOPY;  Surgeon: Manus Gunning, MD;  Location: [facility];  Service: Gastroenterology;  Laterality: N/A;  . IR GUIDED [place]  02/20/2018  . TONSILLECTOMY     about 62years old    SOCIAL HISTORY: Social History   Socioeconomic History  . Marital status: Widowed    Spouse name: Not on file  . Number of children: Not on file  . Years of education: Not on file  . Highest education level: Not on file  Occupational History  . Not on file  Social Needs  . Financial resource strain: Not on file  . Food insecurity:    Worry: Not on file    Inability: Not on file  . Transportation needs:    Medical: Not on file  Non-medical: Not on file  Tobacco Use  . Smoking status: Current Every Day Smoker    Packs/day: 0.50    Years: 33.00    Pack years: 16.50    Types: Cigarettes  . Smokeless tobacco: Never Used  Substance and Sexual Activity  . Alcohol use: No  . Drug use: No  . Sexual activity: Not on file    Comment: Disabled, retinis pigmentosa legally blind, 4children MPOA Lynnn(cousin)  Lifestyle  . Physical activity:    Days per week: Not on file    Minutes per session: Not on file  . Stress: Not on file  Relationships  . Social connections:    Talks on  phone: Not on file    Gets together: Not on file    Attends religious service: Not on file    Active member of club or organization: Not on file    Attends meetings of clubs or organizations: Not on file    Relationship status: Not on file  . Intimate partner violence:    Fear of current or ex partner: Not on file    Emotionally abused: Not on file    Physically abused: Not on file    Forced sexual activity: Not on file  Other Topics Concern  . Not on file  Social History Narrative  . Not on file    FAMILY HISTORY: Family History  Problem Relation Age of Onset  . Colon cancer Neg Hx     ALLERGIES:  has No Known Allergies.  MEDICATIONS:  Scheduled Meds: . chlorhexidine  15 mL Mouth Rinse BID  . enoxaparin (LOVENOX) injection  40 mg Subcutaneous Q24H  . feeding supplement (ENSURE ENLIVE)  237 mL Oral BID BM  . Ibrutinib  560 mg Oral Daily  . mouth rinse  15 mL Mouth Rinse q12n4p  . multivitamin with minerals  1 tablet Oral Daily   Continuous Infusions: PRN Meds:.ondansetron **OR** ondansetron (ZOFRAN) IV, sodium chloride flush, traMADol  REVIEW OF SYSTEMS:    10 Point review of Systems was done is negative except as noted above.   LABORATORY DATA:  I have reviewed the data as listed  . CBC Latest Ref Rng & Units 02/20/2018 02/26/2018 02/24/2018  WBC 4.0 - 10.5 K/uL 7.7 9.8 7.8  Hemoglobin 13.0 - 17.0 g/dL 12.8(L) 13.0 13.3  Hematocrit 39.0 - 52.0 % 41.2 40.7 41.5  Platelets 150 - 400 K/uL 193 167 168    . CMP Latest Ref Rng & Units 02/27/2018 02/26/2018 02/24/2018  Glucose 65 - 99 mg/dL 131(H) 175(H) 193(H)  BUN 6 - 20 mg/dL 14 12 12   Creatinine 0.61 - 1.24 mg/dL 0.77 0.92 0.97  Sodium 135 - 145 mmol/L 138 139 139  Potassium 3.5 - 5.1 mmol/L 3.6 4.6 3.9  Chloride 101 - 111 mmol/L 99(L) 100 102  CO2 22 - 32 mmol/L 26 30(H) 27  Calcium 8.9 - 10.3 mg/dL 8.8(L) 9.5 9.4  Total Protein 6.5 - 8.1 g/dL 5.4(L) 5.6(L) 5.4(L)  Total Bilirubin 0.3 - 1.2 mg/dL 0.8 0.6 0.6    Alkaline Phos 38 - 126 U/L 78 106 94  AST 15 - 41 U/L 13(L) 10 12  ALT 17 - 63 U/L 18 14 16      RADIOGRAPHIC STUDIES: I have personally reviewed the radiological images as listed and agreed with the findings in the report. Dg Chest 1 View  Result Date: 02/14/2018 CLINICAL DATA:  Status post LEFT thoracentesis today. EXAM: CHEST  1 VIEW COMPARISON:  02/06/2018 and prior  studies FINDINGS: Decreased size of loculated LEFT pleural effusion noted. There is no evidence of pneumothorax. LEFT LOWER lung atelectasis/opacity again noted. A RIGHT Port-A-Cath with tip overlying the LOWER SVC again noted. The RIGHT lung is clear. IMPRESSION: Decreased loculated LEFT pleural effusion. No evidence of pneumothorax. Electronically Signed   By: Margarette Canada M.D.   On: 02/14/2018 12:55   Dg Chest 1 View  Result Date: 02/04/2018 CLINICAL DATA:  Status post thoracentesis EXAM: CHEST  1 VIEW COMPARISON:  February 03, 2018 FINDINGS: No pneumothorax. There is moderate loculated effusion on the left currently with patchy consolidation in the left base. Right lung is clear. Heart size and pulmonary vascularity are normal. Port-A-Cath tip is in the superior vena cava. No adenopathy. No bone lesions. IMPRESSION: Moderate residual loculated effusion after thoracentesis. No pneumothorax. There is patchy consolidation in the left base as well. Lungs elsewhere clear. Heart size normal. Electronically Signed   By: Lowella Grip III M.D.   On: 02/04/2018 10:46   Dg Chest 2 View  Result Date: 03/05/2018 CLINICAL DATA:  History of lung carcinoma with shortness of Breath EXAM: CHEST - 2 VIEW COMPARISON:  02/20/2018 PleurX, 02/14/2018 FINDINGS: Cardiac shadow is obscured by a significantly enlarged left-sided pleural effusion. A left PleurX catheter is noted in place. Minimal left lung aeration is noted. Right chest wall port is seen in satisfactory position. Right lung is clear. No bony abnormality is noted. IMPRESSION: Enlarging  left-sided pleural effusion. Electronically Signed   By: Inez Catalina M.D.   On: 02/14/2018 17:00   Dg Chest 2 View  Result Date: 02/06/2018 CLINICAL DATA:  Status post left-sided thoracentesis last week. The patient is were reporting worsening left-sided chest pain and shortness of breath. EXAM: CHEST - 2 VIEW COMPARISON:  PET-CT study of Feb 05, 2018 and chest x-ray of February 04, 2018 FINDINGS: There has been reaccumulation of pleural fluid on the left. It occupies approximately 1/2 of the pleural space volume. There is no significant mediastinal shift. The right lung is clear. The heart and pulmonary vascularity are normal. The power port catheter tip projects over the midportion of the SVC. The bony thorax exhibits no acute abnormality. IMPRESSION: Interval re-accumulation of the left pleural effusion over the past 2 days. No pneumothorax or mediastinal shift. No CHF. Electronically Signed   By: David  Martinique M.D.   On: 02/06/2018 11:18   Ct Chest Wo Contrast  Result Date: 03/05/2018 CLINICAL DATA:  Shortness of breath, cough. EXAM: CT CHEST WITHOUT CONTRAST TECHNIQUE: Multidetector CT imaging of the chest was performed following the standard protocol without IV contrast. COMPARISON:  PET-CT 02/05/2018 and previous FINDINGS: Cardiovascular: Right IJ port catheter to the distal SVC. Heart size normal. No pericardial effusion. Mediastinum/Nodes: Left supraclavicular, anterior mediastinal, right paratracheal, and subcarinal adenopathy as before. There is mild rightward mediastinal shift secondary to left pleural disease. Anterior left pleural mass is probably associated with mediastinal invasion. Lungs/Pleura: Progressive, near complete opacification of the left lung. Large left pleural effusion with good position of tunneled drain catheter. Extensive pleural thickening and masses. Right lung clear. Upper Abdomen: Stable epigastric adenopathy. Stable probable cyst in left hepatic lobe. No acute findings.  Musculoskeletal: Spondylitic changes in the visualized lower cervical spine. No fracture or worrisome bone lesion. IMPRESSION: 1. Progressive opacification of left hemithorax secondary to nodular pleural disease and large effusion. 2. The tunneled left pleural drain catheter appears appropriately positioned, suggesting the patient may benefit from more aggressive thoracentesis. 3. Persistent left supraclavicular, mediastinal,  and epigastric adenopathy. Electronically Signed   By: Lucrezia Europe M.D.   On: 03/05/2018 14:58   Dg Chest Left Decubitus  Result Date: 02/23/2018 CLINICAL DATA:  History left pleural effusion and prior tunneled PleurX catheter placement. Possible enlarging loculated pleural effusion by chest x-ray. EXAM: CHEST - LEFT DECUBITUS COMPARISON:  Chest x-ray earlier today. FINDINGS: It is difficult to determine by a decubitus chest x-ray whether there is a significant component of pleural fluid. It does not appear that there is a large amount layering fluid, but there may be some layering component. Underlying atelectasis and/or consolidation of the lower lung is suspected. IMPRESSION: It is difficult to tell by decubitus chest x-ray whether there is a significant component of pleural fluid present. A CT of the chest would be needed to determine whether fluid is present and if so, its relationship to the existing tunneled PleurX drainage catheter. Electronically Signed   By: Aletta Edouard M.D.   On: 02/27/2018 20:41   Ir Guided Niel Hummer W Catheter Placement  Result Date: 02/21/2018 INDICATION: 62 year old male with a history of recurrent malignant left-sided pleural effusion. EXAM: IMAGE GUIDED TUNNELED PLEURAL CATHETER MEDICATIONS: 2.0 g Ancef. The antibiotics were administered within an appropriate time frame prior to the initiation of the procedure. ANESTHESIA/SEDATION: Fentanyl 100 mcg IV; Versed 2.0 mg IV Moderate Sedation Time:  13 minutes The patient was continuously monitored during the  procedure by the interventional radiology nurse under my direct supervision. COMPLICATIONS: None PROCEDURE: The procedure, risks, benefits, and alternatives were explained to the patient and the patient's family. Specific risks that were addressed included bleeding, infection, pneumothorax, need for further procedure, chance of delayed pneumothorax or hemorrhage, hemoptysis, cardiopulmonary collapse, death. Questions regarding the procedure were encouraged and answered. The patient understands and consents to the procedure. The left chest wall was prepped with Betadine in a sterile fashion, and a sterile drape was applied covering the operative field. A sterile gown and sterile gloves were used for the procedure. Local anesthesia was provided with 1% Lidocaine. Ultrasound image documentation was performed. After creating a small skin incision, a 19 gauge needle was advanced into the pleural cavity under ultrasound guidance. A guide wire was then advanced under fluoroscopy into the pleural space. Pleural access was dilated serially and a 16-French peel-away sheath placed. The skin and subcutaneous tissues were generously infiltrated with 1% lidocaine from the puncture site over the pleura along the intercostal margin anteriorly. A small stab incision was made with 11 blade scalpel at the insertion site of the catheter, and the catheter was back tunneled to the site at the pleural puncture. A tunneled CareFusion Pleurex catheter was placed. This was tunneled from the incision 5 cm anterior to the pleural access to the access site. The catheter was advanced through the peel-away sheath. The sheath was then removed. Final catheter positioning was confirmed with a fluoroscopic spot image. The access incision was closed with Dermabond, applied to the catheterization incision. Large volume thoracentesis was performed through the new catheter utilizing gravity drainage bag. The patient tolerated the procedure well and  remained hemodynamically stable throughout. No complications were encountered and no significant blood loss was encountered. IMPRESSION: Status post left-sided tunneled pleural catheter. Signed, Dulcy Fanny. Earleen Newport, DO [facility] Electronically Signed   By: Corrie Mckusick D.O.   On: 02/21/2018 08:45   Nm Pet Image Restag (ps) Skull Base To Thigh  Result Date: 02/05/2018 CLINICAL DATA:  Subsequent treatment strategy for mantle cell  lymphoma, stage IV. EXAM: NUCLEAR MEDICINE PET SKULL BASE TO THIGH TECHNIQUE: 10.2 mCi F-18 FDG was injected intravenously. Full-ring PET imaging was performed from the skull base to thigh after the radiotracer. CT data was obtained and used for attenuation correction and anatomic localization. Fasting blood glucose: 103 mg/dl COMPARISON:  PET-CT 10/25/2017, 9 /12/18 FINDINGS: Mediastinal blood pool activity: SUV max 2.4 NECK: No hypermetabolic lymph nodes in the neck. Incidental CT findings: none CHEST: There is decreased thickening of the masslike pleural metastasis in the LEFT hemithorax however there is increase in confluence of the pleural involvement. The near entirety of the LEFT hemithorax pleural surface is now involved by the hypermetabolic malignancy with SUV max equal 10.0. Again individual mass lesion decreased in size. For example 3 cm thick lesion in the anterior aspect of the LEFT upper lobe is decreased to 1 cm. Some lesions remain unchanged. For example mass lesion at the level of the LEFT internal mammary nodes measures 3.4 cm compared to 3.6 cm with SUV max equal 15 not changed. Large loculated LEFT effusion remains. There are intensely hypermetabolic mediastinal lymph nodes and internal mammary lymph nodes on the RIGHT unchanged. Interval resolution of hypermetabolic lymph node in the RIGHT axilla. Resolution of the pleural metastasis in the RIGHT hemithorax. Incidental CT findings: Large loculated effusions in  the LEFT hemithorax ABDOMEN/PELVIS: The dominant finding is interval increase in the size the spleen as well as new metabolic activity. Spleen measures 10.8 x 6.8 cm in axial dimension with SUV max equal 10.9 compared to 5.0 by 3.4 cm with no significant metabolic activity background on comparison exam. Conversely, the large mass lesion associated with the rectum is near completely resolved. Lymph nodes in the retroperitoneum have near completely resolved. Example lesion anterior to the RIGHT psoas and IVC previously measured 3.2 cm is no longer measurable. Additional cutaneous nodules have improved and resolved in the pelvis. Example previous nodule in the soft tissues and muscles anterior to the LEFT femoral head has resolved. Incidental CT findings: none SKELETON: No focal hypermetabolic activity to suggest skeletal metastasis. Incidental CT findings: none IMPRESSION: 1. Mixed response to therapy. 2. Decrease in size pleural metastasis in the RIGHT and LEFT lungs. Increase in confluent hypermetabolic pleural lesions LEFT hemithorax. 3. Persistent large loculated LEFT pleural effusions. 4. Persistent hypermetabolic mediastinal lymph nodes and masses 5. 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 6. Marked improvement in mass lesion previous associated with rectum. 7. Additional improvement in lymph nodes in the retroperitoneum and subcutaneous nodes. Electronically Signed   By: Suzy Bouchard M.D.   On: 02/05/2018 17:36   US Thoracentesis Asp Pleural Space W/img Guide  Result Date: 02/14/2018 INDICATION: Patient with history of mantle cell lymphoma with recurrent symptomatic malignant left pleural effusion. Request made for therapeutic left thoracentesis. EXAM: ULTRASOUND GUIDED THERAPEUTIC LEFT THORACENTESIS MEDICATIONS: None COMPLICATIONS: None immediate. PROCEDURE: An ultrasound guided thoracentesis was thoroughly discussed with the  patient and questions answered. The benefits, risks, alternatives and complications were also discussed. The patient understands and wishes to proceed with the procedure. Written consent was obtained. Ultrasound was performed to localize and mark an adequate pocket of fluid in the left chest. The area was then prepped and draped in the normal sterile fashion. 1% Lidocaine was used for local anesthesia. Under ultrasound guidance a 6 Fr Safe-T-Centesis catheter was introduced. Thoracentesis was performed. The catheter was removed and a dressing applied. FINDINGS: A total of approximately 2.4 liters of  blood-tinged fluid was removed. IMPRESSION: Successful ultrasound guided therapeutic left thoracentesis yielding 2.4 liters of pleural fluid. Follow-up chest x-ray revealed no pneumothorax. Read by: Rowe Donjuan, PA-C Electronically Signed   By: Jacqulynn Cadet M.D.   On: 02/14/2018 13:24   US Thoracentesis Asp Pleural Space W/img Guide  Result Date: 02/04/2018 INDICATION: Patient with history of mantle cell lymphoma and recurrent symptomatic malignant left pleural effusion. Request made for therapeutic left thoracentesis. EXAM: ULTRASOUND GUIDED THERAPEUTIC LEFT THORACENTESIS MEDICATIONS: None COMPLICATIONS: None immediate. PROCEDURE: An ultrasound guided thoracentesis was thoroughly discussed with the patient and questions answered. The benefits, risks, alternatives and complications were also discussed. The patient understands and wishes to proceed with the procedure. Written consent was obtained. Ultrasound was performed to localize and mark an adequate pocket of fluid in the left chest. The area was then prepped and draped in the normal sterile fashion. 1% Lidocaine was used for local anesthesia. Under ultrasound guidance a 6 Fr Safe-T-Centesis catheter was introduced. Thoracentesis was performed. The catheter was removed and a dressing applied. FINDINGS: A total of approximately 2.2 liters of blood-tinged  fluid was removed. Due to patient coughing only the above amount of fluid was removed today. IMPRESSION: Successful ultrasound guided therapeutic left thoracentesis yielding 2.2 liters of pleural fluid. Follow-up chest x-ray revealed no pneumothorax. Read by: Rowe Cougar, PA-C Electronically Signed   By: Markus Daft M.D.   On: 02/04/2018 11:34    ASSESSMENT & PLAN:  62 y.o. 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 ECHO nl EF ECOG PS 2 but activities significantly limited due to being legally blind HIV/Hep C/Hep B neg  PET/CT scan after 3 cycles of R CHOP show good overall response. Stomach lesion still with very FDG uptake ? Residual lymphoma versus inflammation.  Patient is status post 6 cycles of R CHOP. PET/CT after 6 cycles shows resolution of hypermetabolic lymphadenopathy and gastric wall activity . Patient was on maintenance Rituxan for 1 year now prior to relapse. -Labs reviewed today (02/21/18) of CBC, CMP, Reticulocytes, Uric acid, phosphorus, and LDH is as follows: all values are WNL except for CBC with WBC at 11.5. CMP with total protein at 5.6, albumin at 2.8. LDH decreased to 472.  -I discussed that we will monitor his counts and give IVF weekly as well as add on rituaxin.  -F/u as scheduled.   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 CT chest abdomen pelvis showed multiple areas of lymphadenopathy in the abdomen as well as in the chest suggesting significant recurrence. PET/CT scan 03/25/2017 shows diffuse involvement with mantle cell lymphoma involving the chest abdomen pelvis and bowels. PET/ct 06/19/2017-- showed significant partial response to BR treatment  3) 2nd Relapse Pleomorphic mantle  cell lymphoma -Presented to hospital on 10/25/17. Workup showed malignant pleural effusion and b/l pleural involvement and rectal urgency due to mantle cell lymphoma causing mass in rectum/sigmoid -PET/CT 10/25/2017 - with significant mantle cell lymphoma progression again. -He was placed onIbrutinib 560 mg p.o. Daily in 10/2017 and has resolution of most of his symptoms and normalization of elevated LDH levels. . 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 PET/CT 02/05/2018 with mixed treatment response. S/p pleurx catheter placement. 5) Rapidly developing splenomegaly related to relapsed MCL PLAN:  -we discussed that his fatigue is multifactorial meds + MCL but the worsening symptoms appears to be related to massive left pleural effusion that is accumulating faster than it can be drained thorugh his indwelling catheter. Hasnt slowed down at this time. -Would recommend consulting pulmonary for mx of malignant left pleural effusion. -will likely need large volume drainage of his left pleural effusion thorugh indwelling catheter with IV albumin support -please consult radiation oncology for consideration of palliative RT to the MCL involving left pleural -I had an honest discussing with the patient regarding progressively limited systemic treatment options. -continue Ibrutinib -holding Venetoclax at this time - his LDH has improved to 283 suggest response with regards to [name]. If he feels better after control of pleural effusion could consider restart the Venetoclax at a lower dose of 50mg . (and perhaps discontinue Ibrutinib for better tolerance). -continue Rituxan q3weeks   3) Retinitis pigmentosa causing legal blindness -  is seeing a retina specialist in town. No acute  interventions at this time. Notes that his vision is progressively getting worse. His daughter and granddaughter are helping him out at home.    4) Weight loss and protein calorie malnutrition.  . Wt Readings from Last 3 Encounters:  02/10/2018 182 lb 5.1 oz (82.7 kg)  02/24/18 184 lb 12.8 oz (83.8 kg)  02/21/18 184 lb (83.5 kg)   . The total time spent in the appointment was 35 minutes and more than 50% was on counseling and direct patient cares.    Sullivan Lone MD MS AAHIVMS [facility] Hematology/Oncology Physician [facility]  (Office):       [phone] (Work cell):  [phone] (Fax):           [phone]  03/05/2018 3:34 PM  I, Baldwin [place], am acting as a Education administrator for Dr Irene Limbo.   .I have reviewed the above documentation for accuracy and completeness, and I agree with the above. Sullivan Lone MD MS

## 2018-03-05 NOTE — Progress Notes (Signed)
[facility]  Patient Status: Active (receiving services up to time of hospitalization)  [facility] is providing the following services: RN  If patient discharges after hours, please call [phone].   Kristopher Johnson 03/05/2018, 9:20 AM

## 2018-03-05 NOTE — Care Management Obs Status (Signed)
[place] NOTIFICATION   Patient Details  Name: NEDIM OKI MRN: [id number] Date of Birth: 1955-12-15   Medicare Observation Status Notification Given:  Yes    MahabirJuliann Pulse, RN 03/05/2018, 4:16 PM

## 2018-03-05 NOTE — Progress Notes (Signed)
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  Denies any fever/chills, cough, chest pain, no leg swelling.  Patient has home health nurse who comes to drain his pleural fluid every 48 hours, patient reports pleural effusion seems to be worsening.  Patient attributed all his new symptoms to his medication venetoclax which has been gradually increased by his oncologist Dr. Irene Limbo who later recommended patient stop the medication.  In the ED, chest x-ray showed left-sided pleural effusion, patient was orthostatic positive.  Patient admitted for further management.  Today, patient reported being uncomfortable, still short of breath, with pain around the Pleurx site.  Denies any fever/chills, cough, chest pain, nausea/vomiting.  Reported being very hungry.  Assessment/Plan: Principal Problem:   Pleural effusion, left Active Problems:   Mantle cell lymphoma of lymph nodes of multiple regions ([place])   Legally blind   Port-A-Cath in place   Malignant pleural effusion  Malignant left-sided pleural effusion status post Pleurx catheter Afebrile, no leukocytosis Chest x-ray showed left pleural effusion, ??  Rapid accumulation versus loculation CT chest pending Will consider consulting IR pending report of CT chest Consider palliative care consult  Orthostatic hypotension/dizziness Likely multifactorial: Dehydration, medication induced S/P IVF, continue oral hydration  Mantle cell lymphoma On chemotherapy, ibrutinib, was told to hold venetoclax by  oncologist Follows with Dr Irene Limbo, spoke to him, will see patient   Code Status: DNR  Family Communication: None at bedside  Disposition Plan: Home once stable   Consultants:  Oncology  Procedures:  None  Antimicrobials:  None  DVT prophylaxis: Lovenox   Objective: Vitals:   03/05/2018 1930 02/18/2018 2000 03/05/2018 2055 03/05/18 0541  BP: 118/62 115/63 131/84 (!) 115/59  Pulse: (!) 114 (!) 101 87 (!) 109  Resp: 17 (!) 27 (!) 24 20  Temp:   97.7 F (36.5 C) 97.8 F (36.6 C)  TempSrc:   Oral Oral  SpO2: 91% 91% 94% 96%  Weight:   82.7 kg (182 lb 5.1 oz)   Height:   5\' 9"  (1.753 m)     Intake/Output Summary (Last 24 hours) at 03/05/2018 1318 Last data filed at 03/05/2018 1300 Gross per 24 hour  Intake 206.25 ml  Output 1000 ml  Net -793.75 ml   Filed Weights   02/21/2018 2055  Weight: 82.7 kg (182 lb 5.1 oz)    Exam:   General: Mild distress  Cardiovascular: S1, S2 present  Respiratory: Decreased breath sounds on left lung  Abdomen: Soft, nontender, nondistended, bowel sounds present  Musculoskeletal: No pedal edema bilaterally  Skin: Normal  Psychiatry: Normal mood   Data Reviewed: CBC: Recent Labs  Lab 02/16/2018 1619  WBC 7.7  NEUTROABS 6.3  HGB 12.8*  HCT 41.2  MCV 91.6  PLT 671   Basic Metabolic Panel: Recent Labs  Lab 02/09/2018 1619  NA 138  K 3.6  CL 99*  CO2 26  GLUCOSE 131*  BUN 14  CREATININE 0.77  CALCIUM 8.8*   GFR: Estimated Creatinine Clearance: 93.3 mL/min (by C-G formula based on SCr of 0.77 mg/dL). Liver Function Tests: Recent Labs  Lab 02/19/2018 1619  AST 13*  ALT 18  ALKPHOS 78  BILITOT 0.8  PROT 5.4*  ALBUMIN 3.0*   No results for input(s): LIPASE, AMYLASE in the last 168 hours. No results for input(s): AMMONIA in the last 168 hours. Coagulation Profile: No results for input(s): INR, PROTIME in the last 168 hours. Cardiac Enzymes: Recent Labs  Lab 02/09/2018 1619 03/07/2018 2203 03/05/18 0342    TROPONINI <0.03 <0.03 <0.03   BNP (last 3 results) No results for input(s): PROBNP in the last 8760 hours. HbA1C: No results for input(s): HGBA1C in the last 72 hours. CBG: No results for input(s): GLUCAP in the last 168 hours. Lipid Profile: No results for input(s): CHOL, HDL, LDLCALC, TRIG, CHOLHDL, LDLDIRECT in the last 72 hours. Thyroid Function Tests: No results for input(s): TSH, T4TOTAL, FREET4, T3FREE, THYROIDAB in the last 72 hours. Anemia Panel: No results for input(s): VITAMINB12, FOLATE, FERRITIN, TIBC, IRON, RETICCTPCT in the last 72 hours. Urine analysis:    Component Value Date/Time   COLORURINE YELLOW 03/01/2018 1620   APPEARANCEUR CLEAR 02/16/2018 1620   LABSPEC 1.026 03/03/2018 1620   PHURINE 6.0 02/26/2018 1620   GLUCOSEU NEGATIVE 02/21/2018 1620   HGBUR SMALL (A) 03/07/2018 1620   BILIRUBINUR NEGATIVE 02/22/2018 1620   KETONESUR 5 (A) 02/27/2018 1620   PROTEINUR 30 (A) 02/06/2018 1620   NITRITE NEGATIVE 02/25/2018 1620   LEUKOCYTESUR NEGATIVE 02/28/2018 1620   Sepsis Labs: @LABRCNTIP (procalcitonin:4,lacticidven:4)  )No results found for this or any previous visit (from the past 240 hour(s)).    Studies: Dg Chest 2 View  Result Date: 02/25/2018 CLINICAL DATA:  History of lung carcinoma with shortness of Breath EXAM: CHEST - 2 VIEW COMPARISON:  02/20/2018 PleurX, 02/14/2018 FINDINGS: Cardiac shadow is obscured by a significantly enlarged left-sided pleural effusion. A left PleurX catheter is noted in place. Minimal left lung aeration is noted. Right chest wall port is seen in satisfactory position. Right lung is clear. No bony abnormality is noted. IMPRESSION: Enlarging left-sided pleural effusion. Electronically Signed   By: Inez Catalina M.D.   On: 02/21/2018 17:00   Dg Chest Left Decubitus  Result Date: 02/21/2018 CLINICAL DATA:  History left pleural effusion and prior tunneled PleurX catheter placement. Possible enlarging loculated pleural effusion by  chest x-ray. EXAM: CHEST - LEFT DECUBITUS COMPARISON:  Chest x-ray earlier today. FINDINGS: It is difficult to determine by a decubitus chest x-ray whether there is a significant component of pleural fluid. It does not appear that there is a large amount layering fluid, but there may be some layering component. Underlying atelectasis and/or consolidation of the lower lung is suspected. IMPRESSION: It is difficult to tell by decubitus chest x-ray whether there is a significant component of pleural fluid present. A CT of the chest would be needed to determine whether fluid is present and if so, its relationship to the existing tunneled PleurX drainage catheter. Electronically Signed   By: Aletta Edouard M.D.   On: 02/19/2018 20:41    Scheduled Meds: . chlorhexidine  15 mL Mouth Rinse BID  . feeding supplement (ENSURE ENLIVE)  237 mL Oral BID BM  . Ibrutinib  560 mg Oral Daily  . mouth rinse  15 mL Mouth Rinse q12n4p  . multivitamin with minerals  1 tablet Oral Daily    Continuous Infusions:   LOS: 0 days     Alma Friendly, MD [facility]  If 7PM-7AM, please contact night-coverage [email or web address] Password [email or web address] 03/05/2018, 1:18 PM

## 2018-03-06 ENCOUNTER — Inpatient Hospital Stay: Payer: Medicare Other | Admitting: Hematology

## 2018-03-06 ENCOUNTER — Inpatient Hospital Stay: Payer: Medicare Other | Admitting: Nutrition

## 2018-03-06 ENCOUNTER — Other Ambulatory Visit: Payer: Medicare Other

## 2018-03-06 ENCOUNTER — Inpatient Hospital Stay: Payer: Medicare Other

## 2018-03-06 DIAGNOSIS — J910 Malignant pleural effusion: Secondary | ICD-10-CM | POA: Diagnosis not present

## 2018-03-06 DIAGNOSIS — C8318 Mantle cell lymphoma, lymph nodes of multiple sites: Secondary | ICD-10-CM | POA: Diagnosis not present

## 2018-03-06 LAB — CBC WITH DIFFERENTIAL/PLATELET
BASOS PCT: 0 %
Basophils Absolute: 0 10*3/uL (ref 0.0–0.1)
EOS ABS: 0 10*3/uL (ref 0.0–0.7)
EOS PCT: 0 %
HCT: 42.6 % (ref 39.0–52.0)
HEMOGLOBIN: 13.4 g/dL (ref 13.0–17.0)
LYMPHS PCT: 11 %
Lymphs Abs: 1.2 10*3/uL (ref 0.7–4.0)
MCH: 29 pg (ref 26.0–34.0)
MCHC: 31.5 g/dL (ref 30.0–36.0)
MCV: 92.2 fL (ref 78.0–100.0)
Monocytes Absolute: 1.4 10*3/uL — ABNORMAL HIGH (ref 0.1–1.0)
Monocytes Relative: 13 %
NEUTROS ABS: 8.2 10*3/uL — AB (ref 1.7–7.7)
Neutrophils Relative %: 76 %
Platelets: 220 10*3/uL (ref 150–400)
RBC: 4.62 MIL/uL (ref 4.22–5.81)
RDW: 15.9 % — ABNORMAL HIGH (ref 11.5–15.5)
WBC: 10.8 10*3/uL — ABNORMAL HIGH (ref 4.0–10.5)

## 2018-03-06 LAB — RENAL FUNCTION PANEL
ANION GAP: 14 (ref 5–15)
Albumin: 3.1 g/dL — ABNORMAL LOW (ref 3.5–5.0)
BUN: 13 mg/dL (ref 6–20)
CHLORIDE: 102 mmol/L (ref 101–111)
CO2: 23 mmol/L (ref 22–32)
CREATININE: 0.87 mg/dL (ref 0.61–1.24)
Calcium: 9.1 mg/dL (ref 8.9–10.3)
GFR calc Af Amer: >60 mL/min (ref >60)
GFR calc non Af Amer: >60 mL/min (ref >60)
GLUCOSE: 179 mg/dL — AB (ref 65–99)
Phosphorus: 2.6 mg/dL (ref 2.5–4.6)
Potassium: 4 mmol/L (ref 3.5–5.1)
SODIUM: 139 mmol/L (ref 135–145)

## 2018-03-06 MED ORDER — HYDROCODONE-ACETAMINOPHEN 5-325 MG PO TABS: 1.0000 | ORAL_TABLET | Freq: Four times a day (QID) | ORAL | Status: DC | PRN

## 2018-03-06 MED ORDER — HYDROCODONE-ACETAMINOPHEN 5-325 MG PO TABS
1.0000 | ORAL_TABLET | ORAL | Status: DC | PRN
Start: 2018-03-06 — End: 2018-03-23
  Administered 2018-03-07 – 2018-03-21 (×24): 2 via ORAL
  Filled 2018-03-06 (×26): qty 2

## 2018-03-06 MED ORDER — MORPHINE SULFATE (PF) 2 MG/ML IV SOLN
2.0000 mg | Freq: Once | INTRAVENOUS | Status: AC
  Administered 2018-03-06: 2 mg via INTRAVENOUS
  Filled 2018-03-06: qty 1

## 2018-03-06 NOTE — Progress Notes (Addendum)
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  Denies any fever/chills, cough, chest pain, no leg swelling.  Patient has home health nurse who comes to drain his pleural fluid every 48 hours, patient reports pleural effusion seems to be worsening.  Patient attributed all his new symptoms to his medication venetoclax which has been gradually increased by his oncologist Dr. Irene Limbo who later recommended patient stop the medication.  In the ED, chest x-ray showed left-sided pleural effusion, patient was orthostatic positive.  Patient admitted for further management.  Today, patient still noted to be SOB, mildly tachycardic, pain around pleurX site. Denies any fever/chills, cough, chest pain, nausea/vomiting.  Assessment/Plan: Principal Problem:   Pleural effusion, left Active Problems:   Mantle cell lymphoma of lymph nodes of multiple regions ([place])   Legally blind   Port-A-Cath in place   Malignant pleural effusion  Malignant left-sided pleural effusion status post Pleurx catheter Afebrile, mild leukocytosis Chest x-ray showed left pleural effusion, ??  Rapid accumulation versus loculation CT chest showed: Progressive opacification of left hemithorax secondary to nodular pleural disease and large effusion Plan for more aggressive thoracentesis via pleurX, with IV albumin    Consider consulting pulm due to rapidly accumulating malignant L pleural effusion Pain management  Orthostatic hypotension/dizziness Improving Likely multifactorial:  Dehydration, medication induced S/P IVF, continue oral hydration  Mantle cell lymphoma On chemotherapy, ibrutinib, was told to hold venetoclax by oncologist Oncologist on board, appreciate rec   Code Status: DNR  Family Communication: None at bedside  Disposition Plan: Home once stable   Consultants:  Oncology  Procedures:  None  Antimicrobials:  None  DVT prophylaxis: Lovenox   Objective: Vitals:   03/05/18 1358 03/05/18 2048 03/06/18 0618 03/06/18 1410  BP: 106/68 134/87 115/76 107/67  Pulse: 100 (!) 125 (!) 116 96  Resp: 18 (!) 24 20 14   Temp: 98.3 F (36.8 C) 97.7 F (36.5 C) 97.8 F (36.6 C) 98.3 F (36.8 C)  TempSrc: Oral Oral Oral Oral  SpO2: 99% 98% 97% 93%  Weight:      Height:        Intake/Output Summary (Last 24 hours) at 03/06/2018 1949 Last data filed at 03/06/2018 1551 Gross per 24 hour  Intake -  Output 1400 ml  Net -1400 ml   Filed Weights   02/22/2018 2055  Weight: 82.7 kg (182 lb 5.1 oz)    Exam:   General: Mild distress  Cardiovascular: S1, S2 present  Respiratory: Decreased breath sounds on left lung  Abdomen: Soft, nontender, nondistended, bowel sounds present  Musculoskeletal: No pedal edema bilaterally  Skin: Normal  Psychiatry: Normal mood   Data Reviewed: CBC: Recent Labs  Lab 03/07/2018 1619 03/05/18 1507 03/06/18 0505  WBC 7.7 7.9 10.8*  NEUTROABS 6.3 6.6 8.2*  HGB 12.8* 12.6* 13.4  HCT 41.2 40.0 42.6  MCV 91.6 91.3 92.2  PLT 193 194 254   Basic Metabolic Panel: Recent Labs  Lab 02/22/2018 1619 03/05/18 1507 03/06/18 0505  NA 138 140 139  K 3.6 3.9 4.0  CL 99* 103 102  CO2 26  24 23  GLUCOSE 131* 116* 179*  BUN 14 12 13   CREATININE 0.77 0.83 0.87  CALCIUM 8.8* 8.6* 9.1  PHOS  --  2.5 2.6   GFR: Estimated Creatinine Clearance: 85.8 mL/min (by C-G formula based on SCr of 0.87 mg/dL). Liver Function Tests: Recent Labs  Lab 02/13/2018 1619 03/05/18 1507 03/06/18 0505  AST 13*  --   --     ALT 18  --   --   ALKPHOS 78  --   --   BILITOT 0.8  --   --   PROT 5.4*  --   --   ALBUMIN 3.0* 2.8* 3.1*   No results for input(s): LIPASE, AMYLASE in the last 168 hours. No results for input(s): AMMONIA in the last 168 hours. Coagulation Profile: No results for input(s): INR, PROTIME in the last 168 hours. Cardiac Enzymes: Recent Labs  Lab 02/17/2018 1619 02/19/2018 2203 03/05/18 0342  TROPONINI <0.03 <0.03 <0.03   BNP (last 3 results) No results for input(s): PROBNP in the last 8760 hours. HbA1C: No results for input(s): HGBA1C in the last 72 hours. CBG: No results for input(s): GLUCAP in the last 168 hours. Lipid Profile: No results for input(s): CHOL, HDL, LDLCALC, TRIG, CHOLHDL, LDLDIRECT in the last 72 hours. Thyroid Function Tests: No results for input(s): TSH, T4TOTAL, FREET4, T3FREE, THYROIDAB in the last 72 hours. Anemia Panel: No results for input(s): VITAMINB12, FOLATE, FERRITIN, TIBC, IRON, RETICCTPCT in the last 72 hours. Urine analysis:    Component Value Date/Time   COLORURINE YELLOW 02/16/2018 1620   APPEARANCEUR CLEAR 02/26/2018 1620   LABSPEC 1.026 03/02/2018 1620   PHURINE 6.0 02/06/2018 1620   GLUCOSEU NEGATIVE 02/06/2018 1620   HGBUR SMALL (A) 03/01/2018 1620   BILIRUBINUR NEGATIVE 02/21/2018 1620   KETONESUR 5 (A) 03/05/2018 1620   PROTEINUR 30 (A) 02/12/2018 1620   NITRITE NEGATIVE 02/18/2018 1620   LEUKOCYTESUR NEGATIVE 03/07/2018 1620   Sepsis Labs: @LABRCNTIP (procalcitonin:4,lacticidven:4)  )No results found for this or any previous visit (from the past 240 hour(s)).    Studies: No results found.  Scheduled Meds: . chlorhexidine  15 mL Mouth Rinse BID  . enoxaparin (LOVENOX) injection  40 mg Subcutaneous Q24H  . feeding supplement (ENSURE ENLIVE)  237 mL Oral BID BM  . Ibrutinib  560 mg Oral Daily  . mouth rinse  15 mL Mouth Rinse q12n4p  . multivitamin with minerals  1 tablet Oral Daily    Continuous Infusions:   LOS: 0 days      Alma Friendly, MD [facility]  If 7PM-7AM, please contact night-coverage [email or web address] Password [facility] 03/06/2018, 7:49 PM

## 2018-03-07 DIAGNOSIS — R0602 Shortness of breath: Secondary | ICD-10-CM | POA: Diagnosis present

## 2018-03-07 DIAGNOSIS — E875 Hyperkalemia: Secondary | ICD-10-CM | POA: Diagnosis present

## 2018-03-07 DIAGNOSIS — N179 Acute kidney failure, unspecified: Secondary | ICD-10-CM | POA: Diagnosis present

## 2018-03-07 DIAGNOSIS — Z9221 Personal history of antineoplastic chemotherapy: Secondary | ICD-10-CM | POA: Diagnosis not present

## 2018-03-07 DIAGNOSIS — K639 Disease of intestine, unspecified: Secondary | ICD-10-CM | POA: Diagnosis not present

## 2018-03-07 DIAGNOSIS — C8318 Mantle cell lymphoma, lymph nodes of multiple sites: Secondary | ICD-10-CM | POA: Diagnosis not present

## 2018-03-07 DIAGNOSIS — Z95828 Presence of other vascular implants and grafts: Secondary | ICD-10-CM | POA: Diagnosis not present

## 2018-03-07 DIAGNOSIS — R42 Dizziness and giddiness: Secondary | ICD-10-CM | POA: Diagnosis not present

## 2018-03-07 DIAGNOSIS — I951 Orthostatic hypotension: Secondary | ICD-10-CM | POA: Diagnosis present

## 2018-03-07 DIAGNOSIS — R944 Abnormal results of kidney function studies: Secondary | ICD-10-CM | POA: Diagnosis not present

## 2018-03-07 DIAGNOSIS — Z515 Encounter for palliative care: Secondary | ICD-10-CM | POA: Diagnosis not present

## 2018-03-07 DIAGNOSIS — C787 Secondary malignant neoplasm of liver and intrahepatic bile duct: Secondary | ICD-10-CM | POA: Diagnosis present

## 2018-03-07 DIAGNOSIS — R7989 Other specified abnormal findings of blood chemistry: Secondary | ICD-10-CM | POA: Diagnosis not present

## 2018-03-07 DIAGNOSIS — K5900 Constipation, unspecified: Secondary | ICD-10-CM | POA: Diagnosis not present

## 2018-03-07 DIAGNOSIS — E872 Acidosis: Secondary | ICD-10-CM | POA: Diagnosis present

## 2018-03-07 DIAGNOSIS — R634 Abnormal weight loss: Secondary | ICD-10-CM | POA: Diagnosis not present

## 2018-03-07 DIAGNOSIS — G893 Neoplasm related pain (acute) (chronic): Secondary | ICD-10-CM | POA: Diagnosis present

## 2018-03-07 DIAGNOSIS — R5383 Other fatigue: Secondary | ICD-10-CM | POA: Diagnosis not present

## 2018-03-07 DIAGNOSIS — F17210 Nicotine dependence, cigarettes, uncomplicated: Secondary | ICD-10-CM | POA: Diagnosis present

## 2018-03-07 DIAGNOSIS — B370 Candidal stomatitis: Secondary | ICD-10-CM | POA: Diagnosis not present

## 2018-03-07 DIAGNOSIS — Z9841 Cataract extraction status, right eye: Secondary | ICD-10-CM | POA: Diagnosis not present

## 2018-03-07 DIAGNOSIS — R000 Tachycardia, unspecified: Secondary | ICD-10-CM | POA: Diagnosis not present

## 2018-03-07 DIAGNOSIS — C8310 Mantle cell lymphoma, unspecified site: Secondary | ICD-10-CM | POA: Diagnosis present

## 2018-03-07 DIAGNOSIS — E43 Unspecified severe protein-calorie malnutrition: Secondary | ICD-10-CM | POA: Diagnosis present

## 2018-03-07 DIAGNOSIS — J90 Pleural effusion, not elsewhere classified: Secondary | ICD-10-CM | POA: Diagnosis not present

## 2018-03-07 DIAGNOSIS — Z6822 Body mass index (BMI) 22.0-22.9, adult: Secondary | ICD-10-CM | POA: Diagnosis not present

## 2018-03-07 DIAGNOSIS — E860 Dehydration: Secondary | ICD-10-CM | POA: Diagnosis present

## 2018-03-07 DIAGNOSIS — C7800 Secondary malignant neoplasm of unspecified lung: Secondary | ICD-10-CM | POA: Diagnosis present

## 2018-03-07 DIAGNOSIS — H548 Legal blindness, as defined in USA: Secondary | ICD-10-CM | POA: Diagnosis present

## 2018-03-07 DIAGNOSIS — R627 Adult failure to thrive: Secondary | ICD-10-CM | POA: Diagnosis not present

## 2018-03-07 DIAGNOSIS — Z7189 Other specified counseling: Secondary | ICD-10-CM | POA: Diagnosis not present

## 2018-03-07 DIAGNOSIS — Z9842 Cataract extraction status, left eye: Secondary | ICD-10-CM | POA: Diagnosis not present

## 2018-03-07 DIAGNOSIS — Z79899 Other long term (current) drug therapy: Secondary | ICD-10-CM | POA: Diagnosis not present

## 2018-03-07 DIAGNOSIS — R161 Splenomegaly, not elsewhere classified: Secondary | ICD-10-CM | POA: Diagnosis not present

## 2018-03-07 DIAGNOSIS — C189 Malignant neoplasm of colon, unspecified: Secondary | ICD-10-CM | POA: Diagnosis present

## 2018-03-07 DIAGNOSIS — M7989 Other specified soft tissue disorders: Secondary | ICD-10-CM | POA: Diagnosis not present

## 2018-03-07 DIAGNOSIS — D72823 Leukemoid reaction: Secondary | ICD-10-CM | POA: Diagnosis not present

## 2018-03-07 DIAGNOSIS — H3552 Pigmentary retinal dystrophy: Secondary | ICD-10-CM | POA: Diagnosis present

## 2018-03-07 DIAGNOSIS — J910 Malignant pleural effusion: Secondary | ICD-10-CM | POA: Diagnosis present

## 2018-03-07 DIAGNOSIS — K625 Hemorrhage of anus and rectum: Secondary | ICD-10-CM | POA: Diagnosis present

## 2018-03-07 DIAGNOSIS — E46 Unspecified protein-calorie malnutrition: Secondary | ICD-10-CM | POA: Diagnosis not present

## 2018-03-07 DIAGNOSIS — R64 Cachexia: Secondary | ICD-10-CM | POA: Diagnosis present

## 2018-03-07 DIAGNOSIS — C799 Secondary malignant neoplasm of unspecified site: Secondary | ICD-10-CM | POA: Diagnosis present

## 2018-03-07 DIAGNOSIS — Z9689 Presence of other specified functional implants: Secondary | ICD-10-CM | POA: Diagnosis present

## 2018-03-07 LAB — CBC WITH DIFFERENTIAL/PLATELET
BASOS PCT: 0 %
Basophils Absolute: 0 10*3/uL (ref 0.0–0.1)
EOS ABS: 0 10*3/uL (ref 0.0–0.7)
Eosinophils Relative: 0 %
HCT: 41.4 % (ref 39.0–52.0)
HEMOGLOBIN: 13 g/dL (ref 13.0–17.0)
Lymphocytes Relative: 11 %
Lymphs Abs: 0.8 10*3/uL (ref 0.7–4.0)
MCH: 28.7 pg (ref 26.0–34.0)
MCHC: 31.4 g/dL (ref 30.0–36.0)
MCV: 91.4 fL (ref 78.0–100.0)
MONO ABS: 1.2 10*3/uL — AB (ref 0.1–1.0)
Monocytes Relative: 16 %
NEUTROS ABS: 5.2 10*3/uL (ref 1.7–7.7)
Neutrophils Relative %: 73 %
PLATELETS: 187 10*3/uL (ref 150–400)
RBC: 4.53 MIL/uL (ref 4.22–5.81)
RDW: 15.9 % — ABNORMAL HIGH (ref 11.5–15.5)
WBC: 7.2 10*3/uL (ref 4.0–10.5)

## 2018-03-07 LAB — RENAL FUNCTION PANEL
Albumin: 2.8 g/dL — ABNORMAL LOW (ref 3.5–5.0)
Anion gap: 12 (ref 5–15)
BUN: 14 mg/dL (ref 6–20)
CALCIUM: 9 mg/dL (ref 8.9–10.3)
CO2: 25 mmol/L (ref 22–32)
CREATININE: 0.87 mg/dL (ref 0.61–1.24)
Chloride: 102 mmol/L (ref 101–111)
GFR calc Af Amer: >60 mL/min (ref >60)
GFR calc non Af Amer: >60 mL/min (ref >60)
Glucose, Bld: 130 mg/dL — ABNORMAL HIGH (ref 65–99)
Phosphorus: 3 mg/dL (ref 2.5–4.6)
Potassium: 4.3 mmol/L (ref 3.5–5.1)
SODIUM: 139 mmol/L (ref 135–145)

## 2018-03-07 NOTE — Progress Notes (Signed)
Patient had a short run of SVT.  PCP was notified

## 2018-03-07 NOTE — Progress Notes (Signed)
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  Denies any fever/chills, cough, chest pain, no leg swelling.  Patient has home health nurse who comes to drain his pleural fluid every 48 hours, patient reports pleural effusion seems to be worsening.  Patient attributed all his new symptoms to his medication venetoclax which has been gradually increased by his oncologist Dr. Irene Limbo who later recommended patient stop the medication.  In the ED, chest x-ray showed left-sided pleural effusion, patient was orthostatic positive.  Patient admitted for further management.  Today, patient still noted to be SOB, pain around pleurX site is controlled. Denies any fever/chills, cough, chest pain, nausea/vomiting.  Assessment/Plan: Principal Problem:   Pleural effusion, left Active Problems:   Mantle cell lymphoma of lymph nodes of multiple regions ([place])   Legally blind   Port-A-Cath in place   Malignant pleural effusion  Malignant left-sided pleural effusion status post Pleurx catheter Afebrile, resolved leukocytosis Chest x-ray showed left pleural effusion, ??  Rapid accumulation versus loculation CT chest showed: Progressive opacification of left hemithorax secondary to nodular pleural disease and large effusion Plan for more aggressive thoracentesis via pleurX, with IV albumin    Consider consulting pulm due to rapidly accumulating malignant L pleural effusion Pain management  Orthostatic hypotension/dizziness Improving Likely multifactorial:  Dehydration, medication induced S/P IVF, continue oral hydration  Mantle cell lymphoma On chemotherapy, ibrutinib, was told to hold venetoclax by oncologist Oncologist on board, appreciate rec   Code Status: DNR  Family Communication: None at bedside  Disposition Plan: Home once stable   Consultants:  Oncology  Procedures:  None  Antimicrobials:  None  DVT prophylaxis: Lovenox   Objective: Vitals:   03/07/18 0518 03/07/18 1347 03/07/18 1347 03/07/18 2030  BP: 114/75  107/60 119/72  Pulse: 92  89 90  Resp: 18  20 20   Temp: 97.8 F (36.6 C) 99.2 F (37.3 C)  97.7 F (36.5 C)  TempSrc: Oral Axillary  Oral  SpO2: 91%  91% 91%  Weight:      Height:        Intake/Output Summary (Last 24 hours) at 03/07/2018 2228 Last data filed at 03/07/2018 1200 Gross per 24 hour  Intake 240 ml  Output 1625 ml  Net -1385 ml   Filed Weights   02/17/2018 2055  Weight: 82.7 kg (182 lb 5.1 oz)    Exam:   General: Mild distress  Cardiovascular: S1, S2 present  Respiratory: Decreased breath sounds on left lung  Abdomen: Soft, nontender, nondistended, bowel sounds present  Musculoskeletal: No pedal edema bilaterally  Skin: Normal  Psychiatry: Normal mood   Data Reviewed: CBC: Recent Labs  Lab 02/25/2018 1619 03/05/18 1507 03/06/18 0505 03/07/18 0426  WBC 7.7 7.9 10.8* 7.2  NEUTROABS 6.3 6.6 8.2* 5.2  HGB 12.8* 12.6* 13.4 13.0  HCT 41.2 40.0 42.6 41.4  MCV 91.6 91.3 92.2 91.4  PLT 193 194 220 176   Basic Metabolic Panel: Recent Labs  Lab 02/22/2018 1619 03/05/18 1507 03/06/18 0505 03/07/18 0426  NA 138 140 139 139  K 3.6 3.9 4.0 4.3  CL 99* 103 102 102  CO2 26 24 23 25   GLUCOSE 131* 116* 179* 130*  BUN 14 12 13 14   CREATININE 0.77 0.83 0.87 0.87  CALCIUM 8.8* 8.6* 9.1 9.0  PHOS  --  2.5 2.6 3.0   GFR: Estimated Creatinine Clearance: 85.8 mL/min (by C-G formula based on SCr of 0.87 mg/dL). Liver Function Tests: Recent Labs  Lab 02/23/2018 1619  03/05/18 1507 03/06/18 0505 03/07/18 0426  AST 13*  --   --   --   ALT 18  --   --   --   ALKPHOS 78  --   --   --   BILITOT 0.8  --   --   --   PROT 5.4*  --   --   --   ALBUMIN 3.0* 2.8* 3.1* 2.8*   No results for input(s): LIPASE, AMYLASE in the last 168 hours. No results for input(s): AMMONIA in the last 168 hours. Coagulation Profile: No results for input(s): INR, PROTIME in the last 168 hours. Cardiac Enzymes: Recent Labs  Lab 02/17/2018 1619 02/19/2018 2203 03/05/18 0342  TROPONINI <0.03 <0.03 <0.03   BNP (last 3 results) No results for input(s): PROBNP in the last 8760 hours. HbA1C: No results for input(s): HGBA1C in the last 72 hours. CBG: No results for input(s): GLUCAP in the last 168 hours. Lipid Profile: No results for input(s): CHOL, HDL, LDLCALC, TRIG, CHOLHDL, LDLDIRECT in the last 72 hours. Thyroid Function Tests: No results for input(s): TSH, T4TOTAL, FREET4, T3FREE, THYROIDAB in the last 72 hours. Anemia Panel: No results for input(s): VITAMINB12, FOLATE, FERRITIN, TIBC, IRON, RETICCTPCT in the last 72 hours. Urine analysis:    Component Value Date/Time   COLORURINE YELLOW 03/01/2018 1620   APPEARANCEUR CLEAR 02/09/2018 1620   LABSPEC 1.026 03/03/2018 1620   PHURINE 6.0 02/09/2018 1620   GLUCOSEU NEGATIVE 02/25/2018 1620   HGBUR SMALL (A) 02/16/2018 1620   BILIRUBINUR NEGATIVE 02/27/2018 1620   KETONESUR 5 (A) 02/27/2018 1620   PROTEINUR 30 (A) 03/07/2018 1620   NITRITE NEGATIVE 03/03/2018 1620   LEUKOCYTESUR NEGATIVE 02/14/2018 1620   Sepsis Labs: @LABRCNTIP (procalcitonin:4,lacticidven:4)  )No results found for this or any previous visit (from the past 240 hour(s)).    Studies: No results found.  Scheduled Meds: . chlorhexidine  15 mL Mouth Rinse BID  . enoxaparin (LOVENOX) injection  40 mg Subcutaneous Q24H  . feeding supplement (ENSURE ENLIVE)  237 mL Oral BID BM  . Ibrutinib  560 mg Oral Daily  . mouth rinse  15 mL Mouth Rinse q12n4p    . multivitamin with minerals  1 tablet Oral Daily    Continuous Infusions:   LOS: 0 days     Alma Friendly, MD [facility]  If 7PM-7AM, please contact night-coverage [email or web address] Password [facility] 03/07/2018, 10:28 PM

## 2018-03-08 DIAGNOSIS — Z515 Encounter for palliative care: Secondary | ICD-10-CM

## 2018-03-08 LAB — CBC WITH DIFFERENTIAL/PLATELET
BASOS ABS: 0 10*3/uL (ref 0.0–0.1)
Basophils Relative: 0 %
EOS PCT: 0 %
Eosinophils Absolute: 0 10*3/uL (ref 0.0–0.7)
HCT: 42.2 % (ref 39.0–52.0)
HEMOGLOBIN: 13.2 g/dL (ref 13.0–17.0)
LYMPHS ABS: 0.9 10*3/uL (ref 0.7–4.0)
LYMPHS PCT: 11 %
MCH: 29 pg (ref 26.0–34.0)
MCHC: 31.3 g/dL (ref 30.0–36.0)
MCV: 92.7 fL (ref 78.0–100.0)
Monocytes Absolute: 1.1 10*3/uL — ABNORMAL HIGH (ref 0.1–1.0)
Monocytes Relative: 14 %
NEUTROS PCT: 75 %
Neutro Abs: 6 10*3/uL (ref 1.7–7.7)
PLATELETS: 191 10*3/uL (ref 150–400)
RBC: 4.55 MIL/uL (ref 4.22–5.81)
RDW: 15.9 % — ABNORMAL HIGH (ref 11.5–15.5)
WBC: 7.9 10*3/uL (ref 4.0–10.5)

## 2018-03-08 LAB — RENAL FUNCTION PANEL
ALBUMIN: 2.8 g/dL — AB (ref 3.5–5.0)
Anion gap: 12 (ref 5–15)
BUN: 16 mg/dL (ref 6–20)
CHLORIDE: 101 mmol/L (ref 101–111)
CO2: 25 mmol/L (ref 22–32)
Calcium: 8.9 mg/dL (ref 8.9–10.3)
Creatinine, Ser: 0.88 mg/dL (ref 0.61–1.24)
Glucose, Bld: 145 mg/dL — ABNORMAL HIGH (ref 65–99)
PHOSPHORUS: 2.5 mg/dL (ref 2.5–4.6)
POTASSIUM: 3.8 mmol/L (ref 3.5–5.1)
Sodium: 138 mmol/L (ref 135–145)

## 2018-03-08 MED ORDER — SENNOSIDES-DOCUSATE SODIUM 8.6-50 MG PO TABS
1.0000 | ORAL_TABLET | Freq: Two times a day (BID) | ORAL | Status: DC
  Administered 2018-03-09 – 2018-03-28 (×16): 1 via ORAL
  Filled 2018-03-08 (×34): qty 1

## 2018-03-08 MED ORDER — ALBUMIN HUMAN 25 % IV SOLN
25.0000 g | Freq: Once | INTRAVENOUS | Status: AC
  Administered 2018-03-08: 25 g via INTRAVENOUS
  Filled 2018-03-08: qty 100

## 2018-03-08 MED ORDER — POLYETHYLENE GLYCOL 3350 17 G PO PACK
17.0000 g | PACK | Freq: Every day | ORAL | Status: DC
  Administered 2018-03-09 – 2018-03-27 (×5): 17 g via ORAL
  Filled 2018-03-08 (×14): qty 1

## 2018-03-08 NOTE — Progress Notes (Signed)
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  Denies any fever/chills, cough, chest pain, no leg swelling.  Patient has home health nurse who comes to drain his pleural fluid every 48 hours, patient reports pleural effusion seems to be worsening.  Patient attributed all his new symptoms to his medication venetoclax which has been gradually increased by his oncologist Dr. Irene Limbo who later recommended patient stop the medication.  In the ED, chest x-ray showed left-sided pleural effusion, patient was orthostatic positive.  Patient admitted for further management.  Today, patient still noted to be SOB, was only able to ambulate for about 132ft, tachycardic, still with pain around pleurX site. Denies any fever/chills, cough, chest pain, nausea/vomiting.  Assessment/Plan: Principal Problem:   Pleural effusion, left Active Problems:   Mantle cell lymphoma of lymph nodes of multiple regions ([place])   Legally blind   Port-A-Cath in place   Malignant pleural effusion   Palliative care encounter  Malignant left-sided pleural effusion status post Pleurx catheter Afebrile, resolved leukocytosis Chest x-ray showed left pleural effusion, ??  Rapid accumulation versus loculation CT chest showed: Progressive opacification of left hemithorax secondary to nodular pleural disease and large effusion Plan for more aggressive thoracentesis via pleurX, with IV albumin/IVF (daily pluerx drainage) Spoke to Dr Isidore Moos with radiation oncology today, plan to see on Monday  for possible palliative radiation treatment Palliative team on board Pain management  Orthostatic hypotension/dizziness Ongoing Likely multifactorial: Dehydration, malignancy, recurrent pleural effusion and drainage S/P IVF, continue oral hydration  Mantle cell lymphoma On chemotherapy, ibrutinib, was told to hold venetoclax by oncologist Oncologist on board, appreciate rec Palliative consulted   Code Status: DNR  Family Communication: None at bedside  Disposition Plan: Home once stable   Consultants:  Oncology  Spoke to Rad Onc  Procedures:  None  Antimicrobials:  None  DVT prophylaxis: Lovenox   Objective: Vitals:   03/07/18 1347 03/07/18 2030 03/08/18 0410 03/08/18 1238  BP: 107/60 119/72 94/66 109/64  Pulse: 89 90 99 97  Resp: 20 20 20 18   Temp:  97.7 F (36.5 C) (!) 97.4 F (36.3 C) (!) 97.5 F (36.4 C)  TempSrc:  Oral Oral Oral  SpO2: 91% 91% 93% 91%  Weight:      Height:        Intake/Output Summary (Last 24 hours) at 03/08/2018 1704 Last data filed at 03/08/2018 0955 Gross per 24 hour  Intake 250 ml  Output -  Net 250 ml   Filed Weights   02/20/2018 2055  Weight: 82.7 kg (182 lb 5.1 oz)    Exam:   General: Mild distress  Cardiovascular: S1, S2 present  Respiratory: Decreased breath sounds on left lung  Abdomen: Soft, nontender, nondistended, bowel sounds present  Musculoskeletal: No pedal edema bilaterally  Skin: Normal  Psychiatry: Normal mood   Data Reviewed: CBC: Recent Labs  Lab 02/10/2018 1619 03/05/18 1507 03/06/18 0505 03/07/18 0426 03/08/18 0437  WBC 7.7 7.9 10.8* 7.2 7.9  NEUTROABS 6.3 6.6 8.2* 5.2 6.0  HGB 12.8* 12.6* 13.4 13.0 13.2  HCT 41.2 40.0 42.6 41.4  42.2  MCV 91.6 91.3 92.2 91.4 92.7  PLT 193 194 220 187 440   Basic Metabolic Panel: Recent Labs  Lab 02/06/2018 1619 03/05/18 1507 03/06/18 0505 03/07/18 0426 03/08/18 0437  NA 138 140 139 139 138  K 3.6 3.9 4.0 4.3 3.8  CL 99* 103 102 102 101    CO2 26 24 23 25 25   GLUCOSE 131* 116* 179* 130* 145*  BUN 14 12 13 14 16   CREATININE 0.77 0.83 0.87 0.87 0.88  CALCIUM 8.8* 8.6* 9.1 9.0 8.9  PHOS  --  2.5 2.6 3.0 2.5   GFR: Estimated Creatinine Clearance: 84.8 mL/min (by C-G formula based on SCr of 0.88 mg/dL). Liver Function Tests: Recent Labs  Lab 02/20/2018 1619 03/05/18 1507 03/06/18 0505 03/07/18 0426 03/08/18 0437  AST 13*  --   --   --   --   ALT 18  --   --   --   --   ALKPHOS 78  --   --   --   --   BILITOT 0.8  --   --   --   --   PROT 5.4*  --   --   --   --   ALBUMIN 3.0* 2.8* 3.1* 2.8* 2.8*   No results for input(s): LIPASE, AMYLASE in the last 168 hours. No results for input(s): AMMONIA in the last 168 hours. Coagulation Profile: No results for input(s): INR, PROTIME in the last 168 hours. Cardiac Enzymes: Recent Labs  Lab 02/13/2018 1619 02/06/2018 2203 03/05/18 0342  TROPONINI <0.03 <0.03 <0.03   BNP (last 3 results) No results for input(s): PROBNP in the last 8760 hours. HbA1C: No results for input(s): HGBA1C in the last 72 hours. CBG: No results for input(s): GLUCAP in the last 168 hours. Lipid Profile: No results for input(s): CHOL, HDL, LDLCALC, TRIG, CHOLHDL, LDLDIRECT in the last 72 hours. Thyroid Function Tests: No results for input(s): TSH, T4TOTAL, FREET4, T3FREE, THYROIDAB in the last 72 hours. Anemia Panel: No results for input(s): VITAMINB12, FOLATE, FERRITIN, TIBC, IRON, RETICCTPCT in the last 72 hours. Urine analysis:    Component Value Date/Time   COLORURINE YELLOW 03/02/2018 1620   APPEARANCEUR CLEAR 02/13/2018 1620   LABSPEC 1.026 02/13/2018 1620   PHURINE 6.0 02/10/2018 1620   GLUCOSEU NEGATIVE 03/01/2018 1620   HGBUR SMALL (A) 02/05/2018 1620   BILIRUBINUR NEGATIVE 03/07/2018 1620   KETONESUR 5 (A) 03/03/2018 1620   PROTEINUR 30 (A) 02/27/2018 1620   NITRITE NEGATIVE 02/16/2018 1620   LEUKOCYTESUR NEGATIVE 03/01/2018 1620   Sepsis  Labs: @LABRCNTIP (procalcitonin:4,lacticidven:4)  )No results found for this or any previous visit (from the past 240 hour(s)).    Studies: No results found.  Scheduled Meds: . chlorhexidine  15 mL Mouth Rinse BID  . enoxaparin (LOVENOX) injection  40 mg Subcutaneous Q24H  . feeding supplement (ENSURE ENLIVE)  237 mL Oral BID BM  . Ibrutinib  560 mg Oral Daily  . mouth rinse  15 mL Mouth Rinse q12n4p  . multivitamin with minerals  1 tablet Oral Daily  . polyethylene glycol  17 g Oral Daily  . senna-docusate  1 tablet Oral BID    Continuous Infusions:   LOS: 1 day     Alma Friendly, MD [facility]  If 7PM-7AM, please contact night-coverage [email or web address] Password [email or web address] 03/08/2018, 5:04 PM

## 2018-03-08 NOTE — Consult Note (Signed)
Consultation Note Date: 03/08/2018   Patient Name: Kristopher Johnson  DOB: 1955-12-15  MRN: [id number]  Age / Sex: 62 y.o., male  PCP: Patient, No Pcp Per Referring Physician: Alma Friendly, MD  Reason for Consultation: Establishing goals of care  HPI/Patient Profile: 62 y.o. male  with past medical history of stage IV mantle cell lymphoma (dx 2017) on chemotx, malignant pleural effusions s/p Pleurx drain (placed 02/20/18), legally blind from retinitis pigmenosa, who was admitted on 02/20/2018 with acute on chronic shortness of breath likely secondary to recurrent L. Pleural effusion vs medication effect from Venclexta. Medical oncology has been consulted and is recommending consideration for XRT. Palliative care was consulted to help establish goals and to discuss possible hospice care.   Clinical Assessment and Goals of Care: I met with patient. No family was present. Prior to this hospitalization, patient was living at home with his daughter. He is ambulatory but mostly chairbound due to chronic dyspnea. Patient also requires a significant amount of care due to his visual impairment. Patient says he is generally frustrated with his medical plan. He says he recognizes that there may not be many good medical treatments remaining for the [facility] and would like to speak with radiation oncology.   I discussed the option of hospice with patient. However, his current goals seem aligned with future treatment through XRT. It is unclear if patient is a candidate for more treatment. I would recommend hospice involvement if oncology does not feel patient is a suitable candidate for future treatment.   Case discussed with attending.   SUMMARY OF RECOMMENDATIONS   1. Continue supportive care including draining effusion 2. Norco for pain 3. DNR noted.       Primary Diagnoses: Present on Admission: . Pleural effusion,  left . Mantle cell lymphoma of lymph nodes of multiple regions ([place]) . Legally blind . Malignant pleural effusion   I have reviewed the medical record, interviewed the patient and family, and examined the patient. The following aspects are pertinent.  Past Medical History:  Diagnosis Date  . Abscess of arm, right 10/27/2015  . Lymphoma of lymph nodes of multiple sites ([place]) 10/27/2015  . 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 Shortness of breath 10/27/2015   Social History   Socioeconomic History  . Marital status: Widowed    Spouse name: Not on file  . Number of children: Not on file  . Years of education: Not on file  . Highest education level: Not on file  Occupational History  . Not on file  Social Needs  . Financial resource strain: Not on file  . Food insecurity:    Worry: Not on file    Inability: Not on file  . Transportation needs:    Medical: Not on file    Non-medical: Not on file  Tobacco Use  . Smoking status: Current Every Day Smoker    Packs/day: 0.50  Years: 33.00    Pack years: 16.50    Types: Cigarettes  . Smokeless tobacco: Never Used  Substance and Sexual Activity  . Alcohol use: No  . Drug use: No  . Sexual activity: Not on file    Comment: Disabled, retinis pigmentosa legally blind, 4children MPOA Lynnn(cousin)  Lifestyle  . Physical activity:    Days per week: Not on file    Minutes per session: Not on file  . Stress: Not on file  Relationships  . Social connections:    Talks on phone: Not on file    Gets together: Not on file    Attends religious service: Not on file    Active member of club or organization: Not on file    Attends meetings of clubs or organizations: Not on file    Relationship status: Not on file  Other Topics Concern  . Not on file  Social History Narrative  . Not on file   Family History  Problem Relation Age of Onset  .  Colon cancer Neg Hx    Scheduled Meds: . chlorhexidine  15 mL Mouth Rinse BID  . enoxaparin (LOVENOX) injection  40 mg Subcutaneous Q24H  . feeding supplement (ENSURE ENLIVE)  237 mL Oral BID BM  . Ibrutinib  560 mg Oral Daily  . mouth rinse  15 mL Mouth Rinse q12n4p  . multivitamin with minerals  1 tablet Oral Daily  . polyethylene glycol  17 g Oral Daily  . senna-docusate  1 tablet Oral BID   Continuous Infusions: PRN Meds:.HYDROcodone-acetaminophen, ondansetron **OR** ondansetron (ZOFRAN) IV, sodium chloride flush, traMADol Medications Prior to Admission:  Prior to Admission medications   Medication Sig Start Date End Date Taking? Authorizing Provider  docusate sodium (COLACE) 100 MG capsule Take 100 mg by mouth daily as needed for mild constipation.   Yes [provider]  Ibrutinib 560 MG TABS Take 560 mg by mouth daily. Take with a full glass of water at approximately the same time daily, maintain hydration. 11/28/17  Yes Brunetta Genera, MD  prochlorperazine (COMPAZINE) 5 MG tablet Take 2 tablets (10 mg total) by mouth every 6 (six) hours as needed for nausea or vomiting. 02/24/18  Yes Tanner, Lyndon Code., PA-C  traMADol (ULTRAM) 50 MG tablet Take 2 tablets (100 mg total) by mouth every 4 (four) hours as needed for moderate pain or severe pain. 02/12/18  Yes Hayden Pedro, PA-C  allopurinol (ZYLOPRIM) 300 MG tablet Take 1 tablet (300 mg total) by mouth daily. Patient not taking: Reported on 02/18/2018 02/13/18   Brunetta Genera, MD  dexamethasone (DECADRON) 4 MG tablet Take 1 tablet (4 mg total) by mouth daily. Patient not taking: Reported on 02/28/2018 02/24/18   Brunetta Genera, MD  pantoprazole (PROTONIX) 40 MG tablet Take 1 tablet (40 mg total) by mouth daily. Patient not taking: Reported on 02/06/2018 01/31/18   Hosie Poisson, MD  senna-docusate (SENNA S) 8.6-50 MG tablet Take 2 tablets by mouth at bedtime. Patient not taking: Reported on 02/18/2018 02/06/18    Brunetta Genera, MD  Venetoclax 10 & 50 & 100 MG TBPK Take 20 mg by mouth daily. LHTD4: Take 37m daily x 7 days. WKAJG8 Take 1020mdaily x 7 days. WeTLXB2Take 20065maily x 7 days. Patient not taking: Reported on 02/24/2018 02/14/18   KalBrunetta GeneraD   No Known Allergies Review of Systems  Constitutional:       Generalized pain  Gastrointestinal: Negative for  nausea and vomiting.       Anorexia    Physical Exam  Constitutional: He is oriented to person, place, and time.  Frail appearing  Neck: Normal range of motion.  Cardiovascular: Normal rate and regular rhythm.  Pulmonary/Chest: He has decreased breath sounds. He has no wheezes. He has no rhonchi. He has no rales.  Abdominal: He exhibits distension.  Neurological: He is alert and oriented to person, place, and time.  Skin: Skin is warm and dry.    Vital Signs: BP 109/64 (BP Location: Right Arm)   Pulse 97   Temp (!) 97.5 F (36.4 C) (Oral)   Resp 18   Ht _0  (1.753 m)   Wt 82.7 kg (182 lb 5.1 oz)   SpO2 91%   BMI 26.92 kg/m  Pain Scale: 0-10 POSS *See Group Information*: 1-Acceptable,Awake and alert Pain Score: 0-No pain   SpO2: SpO2: 91 % O2 Device:SpO2: 91 % O2 Flow Rate: .   IO: Intake/output summary:   Intake/Output Summary (Last 24 hours) at 03/08/2018 1503 Last data filed at 03/08/2018 0955 Gross per 24 hour  Intake 250 ml  Output -  Net 250 ml    LBM: Last BM Date: 03/05/18 Baseline Weight: Weight: 82.7 kg (182 lb 5.1 oz) Most recent weight: Weight: 82.7 kg (182 lb 5.1 oz)     Palliative Assessment/Data:   Flowsheet Rows     Most Recent Value  Intake Tab  Referral Department  Hospitalist  Unit at Time of Referral  Med/Surg Unit  Palliative Care Primary Diagnosis  Cancer  Date Notified  03/08/18  Palliative Care Type  New Palliative care  Reason for referral  Clarify Goals of Care  Date of Admission  02/14/2018  Date first seen by Palliative Care  03/08/18  # of days Palliative  referral response time  0 Day(s)  # of days IP prior to Palliative referral  4  Clinical Assessment  Palliative Performance Scale Score  40%  Psychosocial & Spiritual Assessment  Palliative Care Outcomes      Time In: 1500 Time Out: 1530 Time Total: 30 minutes Greater than 50%  of this time was spent counseling and coordinating care related to the above assessment and plan.  Signed by: Irean Hong, NP   Please contact Palliative Medicine Team phone at [phone] for questions and concerns.  For individual provider: See Shea Evans

## 2018-03-08 DEATH — deceased

## 2018-03-09 ENCOUNTER — Inpatient Hospital Stay (HOSPITAL_COMMUNITY): Payer: Medicare Other

## 2018-03-09 LAB — RENAL FUNCTION PANEL
Albumin: 3.2 g/dL — ABNORMAL LOW (ref 3.5–5.0)
Anion gap: 12 (ref 5–15)
BUN: 15 mg/dL (ref 6–20)
CALCIUM: 8.9 mg/dL (ref 8.9–10.3)
CHLORIDE: 99 mmol/L — AB (ref 101–111)
CO2: 26 mmol/L (ref 22–32)
CREATININE: 0.84 mg/dL (ref 0.61–1.24)
GFR calc non Af Amer: >60 mL/min (ref >60)
GLUCOSE: 133 mg/dL — AB (ref 65–99)
Phosphorus: 2.8 mg/dL (ref 2.5–4.6)
Potassium: 4.1 mmol/L (ref 3.5–5.1)
SODIUM: 137 mmol/L (ref 135–145)

## 2018-03-09 LAB — CBC WITH DIFFERENTIAL/PLATELET
BAND NEUTROPHILS: 4 %
BASOS PCT: 0 %
Basophils Absolute: 0 10*3/uL (ref 0.0–0.1)
EOS PCT: 0 %
Eosinophils Absolute: 0 10*3/uL (ref 0.0–0.7)
HEMATOCRIT: 40.4 % (ref 39.0–52.0)
HEMOGLOBIN: 12.5 g/dL — AB (ref 13.0–17.0)
Lymphocytes Relative: 2 %
Lymphs Abs: 0.2 10*3/uL — ABNORMAL LOW (ref 0.7–4.0)
MCH: 28.5 pg (ref 26.0–34.0)
MCHC: 30.9 g/dL (ref 30.0–36.0)
MCV: 92.2 fL (ref 78.0–100.0)
Metamyelocytes Relative: 2 %
Monocytes Absolute: 0.6 10*3/uL (ref 0.1–1.0)
Monocytes Relative: 8 %
NEUTROS ABS: 6.9 10*3/uL (ref 1.7–7.7)
NEUTROS PCT: 84 %
Platelets: 172 10*3/uL (ref 150–400)
RBC: 4.38 MIL/uL (ref 4.22–5.81)
RDW: 15.7 % — ABNORMAL HIGH (ref 11.5–15.5)
WBC: 7.7 10*3/uL (ref 4.0–10.5)

## 2018-03-09 LAB — D-DIMER, QUANTITATIVE: D-Dimer, Quant: 2.45 ug/mL-FEU — ABNORMAL HIGH (ref 0.00–0.50)

## 2018-03-09 MED ORDER — IOPAMIDOL (ISOVUE-370) INJECTION 76%
INTRAVENOUS | Status: AC
  Filled 2018-03-09: qty 100

## 2018-03-09 MED ORDER — IOPAMIDOL (ISOVUE-370) INJECTION 76%
100.0000 mL | Freq: Once | INTRAVENOUS | Status: AC | PRN
  Administered 2018-03-09: 100 mL via INTRAVENOUS

## 2018-03-09 NOTE — Progress Notes (Signed)
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  Denies any fever/chills, cough, chest pain, no leg swelling.  Patient has home health nurse who comes to drain his pleural fluid every 48 hours, patient reports pleural effusion seems to be worsening.  Patient attributed all his new symptoms to his medication venetoclax which has been gradually increased by his oncologist Dr. Irene Limbo who later recommended patient stop the medication.  In the ED, chest x-ray showed left-sided pleural effusion, patient was orthostatic positive.  Patient admitted for further management.  Today, patient still noted to be SOB. Denies any fever/chills, cough, chest pain, nausea/vomiting.  Assessment/Plan: Principal Problem:   Pleural effusion, left Active Problems:   Mantle cell lymphoma of lymph nodes of multiple regions ([place])   Legally blind   Port-A-Cath in place   Malignant pleural effusion   Palliative care encounter   Malignant left-sided pleural effusion status post Pleurx catheter Afebrile, resolved leukocytosis Chest x-ray showed left pleural effusion, ??  Rapid accumulation versus loculation CT chest showed: Progressive opacification of left hemithorax secondary to nodular pleural disease and large effusion Repeat CT angio done on 03/09/18: negative for PE, large left pleural effusion filling the left hemithorax resulting in significant collapse of the left lung and shifting the heart and mediastinum to the right.  Spoke to the RN to be more aggressive with  drainage via pleurX (been doing only about 1L daily). If pt becomes hypotensive or very dizzy, will give IV albumin. Spoke to Dr Isidore Moos with radiation oncology on 03/08/18, plan to see on 03/10/18  for possible palliative radiation treatment Palliative team on board Pain management  Orthostatic hypotension/dizziness Ongoing Likely multifactorial: Dehydration, malignancy, recurrent pleural effusion and drainage S/P IVF, continue oral hydration  Mantle cell lymphoma On chemotherapy, ibrutinib, was told to hold venetoclax by oncologist Oncologist on board, appreciate rec Palliative consulted   Code Status: DNR  Family Communication: None at bedside  Disposition Plan: Home once stable   Consultants:  Oncology  Spoke to Rad Onc  Procedures:  None  Antimicrobials:  None  DVT prophylaxis: Lovenox   Objective: Vitals:   03/08/18 2057 03/09/18 0037 03/09/18 0628 03/09/18 1334  BP: (!) 111/55  (!) 104/58 107/64  Pulse: 79  93 (!) 103  Resp: 16  12 18   Temp: 97.7 F (36.5 C)  97.7 F (36.5 C) 98 F (36.7 C)  TempSrc: Oral  Oral Oral  SpO2: 90% 92% 93% 92%  Weight:      Height:        Intake/Output Summary (Last 24 hours) at 03/09/2018 1701 Last data filed at 03/08/2018 2300 Gross per 24 hour  Intake 240 ml  Output 1200 ml  Net -960 ml   Filed Weights   02/18/2018 2055  Weight: 82.7 kg (182 lb 5.1 oz)    Exam:   General: Mild distress  Cardiovascular: S1, S2 present  Respiratory: Decreased breath sounds on left lung  Abdomen: Soft, nontender, nondistended, bowel sounds present  Musculoskeletal: No pedal edema bilaterally  Skin: Normal  Psychiatry: Normal mood   Data Reviewed: CBC:  Recent Labs  Lab 03/05/18 1507 03/06/18 0505 03/07/18 0426 03/08/18 0437 03/09/18 0437  WBC 7.9 10.8* 7.2 7.9 7.7  NEUTROABS 6.6 8.2* 5.2 6.0 6.9  HGB 12.6* 13.4 13.0 13.2 12.5*  HCT 40.0 42.6 41.4 42.2 40.4  MCV 91.3 92.2 91.4 92.7 92.2  PLT 194 220 187 191 854    Basic Metabolic Panel: Recent Labs  Lab 03/05/18 1507 03/06/18 0505 03/07/18 0426 03/08/18 0437 03/09/18 0437  NA 140 139 139 138 137  K 3.9 4.0 4.3 3.8 4.1  CL 103 102 102 101 99*  CO2 24 23 25 25 26   GLUCOSE 116* 179* 130* 145* 133*  BUN 12 13 14 16 15   CREATININE 0.83 0.87 0.87 0.88 0.84  CALCIUM 8.6* 9.1 9.0 8.9 8.9  PHOS 2.5 2.6 3.0 2.5 2.8   GFR: Estimated Creatinine Clearance: 88.8 mL/min (by C-G formula based on SCr of 0.84 mg/dL). Liver Function Tests: Recent Labs  Lab 02/23/2018 1619 03/05/18 1507 03/06/18 0505 03/07/18 0426 03/08/18 0437 03/09/18 0437  AST 13*  --   --   --   --   --   ALT 18  --   --   --   --   --   ALKPHOS 78  --   --   --   --   --   BILITOT 0.8  --   --   --   --   --   PROT 5.4*  --   --   --   --   --   ALBUMIN 3.0* 2.8* 3.1* 2.8* 2.8* 3.2*   No results for input(s): LIPASE, AMYLASE in the last 168 hours. No results for input(s): AMMONIA in the last 168 hours. Coagulation Profile: No results for input(s): INR, PROTIME in the last 168 hours. Cardiac Enzymes: Recent Labs  Lab 02/19/2018 1619 03/01/2018 2203 03/05/18 0342  TROPONINI <0.03 <0.03 <0.03   BNP (last 3 results) No results for input(s): PROBNP in the last 8760 hours. HbA1C: No results for input(s): HGBA1C in the last 72 hours. CBG: No results for input(s): GLUCAP in the last 168 hours. Lipid Profile: No results for input(s): CHOL, HDL, LDLCALC, TRIG, CHOLHDL, LDLDIRECT in the last 72 hours. Thyroid Function Tests: No results for input(s): TSH, T4TOTAL, FREET4, T3FREE, THYROIDAB in the last 72 hours. Anemia Panel: No results for input(s): VITAMINB12, FOLATE, FERRITIN, TIBC, IRON, RETICCTPCT in the last 72 hours. Urine analysis:    Component Value Date/Time   COLORURINE YELLOW 03/06/2018 1620   APPEARANCEUR CLEAR 02/23/2018 1620   LABSPEC 1.026 03/03/2018 1620   PHURINE 6.0 03/06/2018 1620   GLUCOSEU NEGATIVE 03/06/2018 1620   HGBUR SMALL (A) 02/23/2018 1620    BILIRUBINUR NEGATIVE 02/18/2018 1620   KETONESUR 5 (A) 02/27/2018 1620   PROTEINUR 30 (A) 02/28/2018 1620   NITRITE NEGATIVE 03/02/2018 1620   LEUKOCYTESUR NEGATIVE 02/25/2018 1620   Sepsis Labs: @LABRCNTIP (procalcitonin:4,lacticidven:4)  )No results found for this or any previous visit (from the past 240 hour(s)).    Studies: Ct Angio Chest Pe W Or Wo Contrast  Result Date: 03/09/2018 CLINICAL DATA:  62 year old male with stage IV lymphoma. Recurrent malignant effusion with catheter placed Feb 20, 2018. Dizziness and shortness of breath. EXAM: CT ANGIOGRAPHY CHEST WITH CONTRAST TECHNIQUE: Multidetector CT imaging of the chest was performed using the standard protocol during bolus administration of intravenous contrast. Multiplanar CT image reconstructions and MIPs were obtained to evaluate the vascular anatomy. CONTRAST:  118mL ISOVUE-370 IOPAMIDOL (ISOVUE-370) INJECTION 76% COMPARISON:  Chest CT Mar 05, 2018 FINDINGS: Cardiovascular: The heart mediastinum are shifted to the right due to a large left pleural effusion which will be described below. The thoracic aorta is normal in caliber with no atherosclerosis, aneurysm, or dissection. The heart size is normal and stable. No obvious coronary artery calcifications. Evaluation of pulmonary arterial branches on the right is limited due to significant atelectatic lung due to the large right pleural effusion. No pulmonary emboli identified. Mediastinum/Nodes: There is a large left pleural effusion filling nearly the entire left hemithorax resulting in significant atelectatic/collapsed lung on the left. Only minimal aerated lung remains on the left. The effusion results in shift of the heart mediastinum to the right. There is diffuse thickening of the right-sided pleura with pleural based masses remaining. The largest anterior pleural base mass on series 4, image 27 extends in the anterior chest wall, stable. No right-sided pleural effusion. No pericardial  effusion. No right axillary adenopathy. Mildly enlarged nodes are seen in the base of the neck, particularly on the left. A 14 mm node is seen on image 5 in the base of the neck on the left. Left axillary and retropectoral adenopathy remains, similar in the interval. There is skin thickening and edema associated with the left chest wall including the breast. This does not appear to be confined to the breast. The chest wall is otherwise unremarkable. Adenopathy remains in the mediastinum. The esophagus and thyroid are unremarkable. Lungs/Pleura: There is only minimal aerated lung on the left. The right lung is free of infiltrate, nodule, or mass. No pneumothorax. The central airways are unremarkable. Upper Abdomen: Mild adenopathy remains in the upper abdomen. Musculoskeletal: No chest wall abnormality. No acute or significant osseous findings. Review of the MIP images confirms the above findings. IMPRESSION: 1. No pulmonary emboli identified. Evaluation of left-sided pulmonary arterial branches is limited due to near complete collapse of the left lung secondary to a large left pleural effusion. 2. There is a large left pleural effusion filling the left hemithorax resulting in significant collapse of the left lung. The pleura is thickened and there are multiple pleural-based masses, unchanged. The large left effusion shifts the heart and mediastinum to the right. 3. Adenopathy in the left axilla, base of neck, left greater than right, mediastinum, and upper abdomen consistent with the patient's known lymphoma. Electronically Signed   By: Dorise Bullion III M.D   On: 03/09/2018 08:50    Scheduled Meds: . chlorhexidine  15 mL Mouth Rinse BID  . enoxaparin (LOVENOX) injection  40 mg Subcutaneous Q24H  . feeding supplement (ENSURE ENLIVE)  237 mL Oral BID BM  . Ibrutinib  560 mg Oral Daily  . iopamidol      . mouth rinse  15 mL Mouth Rinse q12n4p  . multivitamin with minerals  1 tablet Oral Daily  .  polyethylene glycol  17 g Oral Daily  . senna-docusate  1 tablet Oral BID    Continuous Infusions:   LOS: 2 days     Alma Friendly, MD [facility]  If 7PM-7AM, please contact night-coverage [email or web address] Password [email or web address] 03/09/2018, 5:01 PM

## 2018-03-09 NOTE — Plan of Care (Signed)
  Problem: Education: Goal: Knowledge of General Education information will improve Outcome: Progressing   Problem: Health Behavior/Discharge Planning: Goal: Ability to manage health-related needs will improve Outcome: Progressing   Problem: Elimination: Goal: Will not experience complications related to urinary retention Outcome: Progressing   Problem: Skin Integrity: Goal: Risk for impaired skin integrity will decrease Outcome: Progressing

## 2018-03-10 MED ORDER — ENSURE ENLIVE PO LIQD
237.0000 mL | Freq: Three times a day (TID) | ORAL | Status: DC
  Administered 2018-03-11 – 2018-03-14 (×4): 237 mL via ORAL

## 2018-03-10 NOTE — Progress Notes (Signed)
   03/10/18 1540  Clinical Encounter Type  Visited With Patient  Visit Type Initial (Rounding on Palliative )   Rounding on Palliative Patients.  Patient was alone and  In the bed.  Stated they keep draining fluid but not sure it helps.  States he does have family in town that have been checking on him.  States he has pain and hopes they can figure out a way to have it level out.  Seemed to have a lot on his mind, but not open to saying much on this visit.  Will follow and support. Chaplain Katherene Ponto

## 2018-03-10 NOTE — Progress Notes (Signed)
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  Denies any fever/chills, cough, chest pain, no leg swelling.  Patient has home health nurse who comes to drain his pleural fluid every 48 hours, patient reports pleural effusion seems to be worsening.  Patient attributed all his new symptoms to his medication venetoclax which has been gradually increased by his oncologist Dr. Irene Limbo who later recommended patient stop the medication.  In the ED, chest x-ray showed left-sided pleural effusion, patient was orthostatic positive.  Patient admitted for further management.  Today, patient reported feeling better after about 2L was drained. Pleurx site pain controlled. Denies any fever/chills, cough, chest pain, nausea/vomiting.  Assessment/Plan: Principal Problem:   Pleural effusion, left Active Problems:   Mantle cell lymphoma of lymph nodes of multiple regions ([place])   Legally blind   Port-A-Cath in place   Malignant pleural effusion   Palliative care encounter   Malignant left-sided pleural effusion status post Pleurx catheter Afebrile, resolved leukocytosis Chest x-ray showed left pleural effusion, ??  Rapid accumulation versus loculation CT chest showed: Progressive opacification of left hemithorax secondary to nodular pleural disease and large effusion Repeat CT angio done on 03/09/18: negative for PE, large left pleural effusion filling the left hemithorax resulting in significant collapse of the left lung and shifting the heart and mediastinum to the  right.  Spoke to the RN to be more aggressive with drainage via pleurX (been doing only about 1L daily). If pt becomes hypotensive or very dizzy, will give IV albumin (nursing order placed) Spoke to Dr Isidore Moos with radiation oncology on 03/08/18, unsure if he was seen today as no note. However, an appointment is scheduled for rad onc on 6/4, for possible palliative radiation treatment Palliative team on board Pain management  Orthostatic hypotension/dizziness Likely multifactorial: Dehydration, malignancy, recurrent pleural effusion and drainage S/P IVF, continue oral hydration  Mantle cell lymphoma On chemotherapy, ibrutinib, was told to hold venetoclax by oncologist Oncologist on board, appreciate rec Palliative consulted   Code Status: DNR  Family Communication: None at bedside  Disposition Plan: Home once stable   Consultants:  Oncology  Spoke to Rad Onc  Procedures:  None  Antimicrobials:  None  DVT prophylaxis: Lovenox   Objective: Vitals:   03/09/18 1334 03/09/18 2050 03/10/18 0536 03/10/18 1336  BP: 107/64 (!) 109/55 123/77 112/65  Pulse: (!) 103 97 97 96  Resp: 18 18 18 19   Temp: 98 F (36.7 C) 97.7 F (36.5 C) 97.6 F (36.4 C) 97.7 F (36.5 C)  TempSrc: Oral Oral Oral Oral  SpO2: 92% 90% 92% 92%  Weight:      Height:        Intake/Output Summary (Last 24 hours) at 03/10/2018 1826 Last data filed at 03/10/2018 1300 Gross per 24 hour  Intake 120 ml  Output 1900 ml  Net -1780 ml   Filed Weights   02/17/2018 2055  Weight: 82.7 kg (182 lb 5.1 oz)    Exam:   General: NAD  Cardiovascular: S1, S2 present  Respiratory: Decreased breath sounds on left lung  Abdomen: Soft,  nontender, nondistended, bowel sounds present  Musculoskeletal: No pedal edema bilaterally  Skin: Normal  Psychiatry: Normal mood   Data Reviewed: CBC: Recent Labs  Lab 03/05/18 1507 03/06/18 0505 03/07/18 0426 03/08/18 0437 03/09/18 0437  WBC 7.9 10.8* 7.2 7.9  7.7  NEUTROABS 6.6 8.2* 5.2 6.0 6.9  HGB 12.6* 13.4 13.0 13.2 12.5*  HCT 40.0 42.6 41.4 42.2 40.4  MCV 91.3 92.2 91.4 92.7 92.2  PLT 194 220 187 191 270   Basic Metabolic Panel: Recent Labs  Lab 03/05/18 1507 03/06/18 0505 03/07/18 0426 03/08/18 0437 03/09/18 0437  NA 140 139 139 138 137  K 3.9 4.0 4.3 3.8 4.1  CL 103 102 102 101 99*  CO2 24 23 25 25 26   GLUCOSE 116* 179* 130* 145* 133*  BUN 12 13 14 16 15   CREATININE 0.83 0.87 0.87 0.88 0.84  CALCIUM 8.6* 9.1 9.0 8.9 8.9  PHOS 2.5 2.6 3.0 2.5 2.8   GFR: Estimated Creatinine Clearance: 88.8 mL/min (by C-G formula based on SCr of 0.84 mg/dL). Liver Function Tests: Recent Labs  Lab 02/18/2018 1619 03/05/18 1507 03/06/18 0505 03/07/18 0426 03/08/18 0437 03/09/18 0437  AST 13*  --   --   --   --   --   ALT 18  --   --   --   --   --   ALKPHOS 78  --   --   --   --   --   BILITOT 0.8  --   --   --   --   --   PROT 5.4*  --   --   --   --   --   ALBUMIN 3.0* 2.8* 3.1* 2.8* 2.8* 3.2*   No results for input(s): LIPASE, AMYLASE in the last 168 hours. No results for input(s): AMMONIA in the last 168 hours. Coagulation Profile: No results for input(s): INR, PROTIME in the last 168 hours. Cardiac Enzymes: Recent Labs  Lab 02/08/2018 1619 03/05/2018 2203 03/05/18 0342  TROPONINI <0.03 <0.03 <0.03   BNP (last 3 results) No results for input(s): PROBNP in the last 8760 hours. HbA1C: No results for input(s): HGBA1C in the last 72 hours. CBG: No results for input(s): GLUCAP in the last 168 hours. Lipid Profile: No results for input(s): CHOL, HDL, LDLCALC, TRIG, CHOLHDL, LDLDIRECT in the last 72 hours. Thyroid Function Tests: No results for input(s): TSH, T4TOTAL, FREET4, T3FREE, THYROIDAB in the last 72 hours. Anemia Panel: No results for input(s): VITAMINB12, FOLATE, FERRITIN, TIBC, IRON, RETICCTPCT in the last 72 hours. Urine analysis:    Component Value Date/Time   COLORURINE YELLOW 02/23/2018 1620   APPEARANCEUR  CLEAR 02/10/2018 1620   LABSPEC 1.026 02/16/2018 1620   PHURINE 6.0 02/12/2018 1620   GLUCOSEU NEGATIVE 02/09/2018 1620   HGBUR SMALL (A) 03/03/2018 1620   BILIRUBINUR NEGATIVE 02/14/2018 1620   KETONESUR 5 (A) 02/22/2018 1620   PROTEINUR 30 (A) 03/07/2018 1620   NITRITE NEGATIVE 02/09/2018 1620   LEUKOCYTESUR NEGATIVE 02/12/2018 1620   Sepsis Labs: @LABRCNTIP (procalcitonin:4,lacticidven:4)  )No results found for this or any previous visit (from the past 240 hour(s)).    Studies: No results found.  Scheduled Meds: . chlorhexidine  15 mL Mouth Rinse BID  . enoxaparin (LOVENOX) injection  40 mg Subcutaneous Q24H  . feeding supplement (ENSURE ENLIVE)  237 mL Oral TID BM  . Ibrutinib  560 mg Oral Daily  . mouth rinse  15 mL Mouth Rinse q12n4p  . multivitamin with minerals  1 tablet Oral Daily  . polyethylene glycol  17 g Oral Daily  . senna-docusate  1 tablet Oral BID    Continuous Infusions:   LOS: 3 days     Alma Friendly, MD [facility]  If 7PM-7AM, please contact night-coverage [email or web address] Password [email or web address] 03/10/2018, 6:26 PM

## 2018-03-10 NOTE — Plan of Care (Signed)
  Problem: Education: Goal: Knowledge of General Education information will improve Outcome: Progressing   Problem: Health Behavior/Discharge Planning: Goal: Ability to manage health-related needs will improve Outcome: Progressing   Problem: Nutrition: Goal: Adequate nutrition will be maintained Outcome: Progressing   Problem: Elimination: Goal: Will not experience complications related to urinary retention Outcome: Progressing   Problem: Skin Integrity: Goal: Risk for impaired skin integrity will decrease Outcome: Progressing

## 2018-03-10 NOTE — Progress Notes (Signed)
Nutrition Follow-up  DOCUMENTATION CODES:   Not applicable  INTERVENTION:    Ensure Enlive po TID, each supplement provides 350 kcal and 20 grams of protein  Provide MVI daily  NUTRITION DIAGNOSIS:   Inadequate oral intake related to acute illness, decreased appetite, nausea as evidenced by per patient/family report, meal completion < 25%.  Ongoing  GOAL:   Patient will meet greater than or equal to 90% of their needs  Progressing  MONITOR:   PO intake, Supplement acceptance, Weight trends, Labs  REASON FOR ASSESSMENT:   Malnutrition Screening Tool    ASSESSMENT:   62 y.o. 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  Brought to the ED via EMS with complaints of dizziness on standing of ~ 4 days duration, such that patient was not able to get out of bed on the day of admission. SOB with exertion--more chronic, over the past month, with associated orthopnea. Since Pleurex placement, 1L pleural fluid has been drained every 48 hours by home health nurse who comes to patient's house. Patient is on venetoclax for his lymphoma. Initial dose was 20 mg, with gradual increase in dose every week. The day before admission, patient took 100 mg.  Patient states since he started taking the medication he has gotten progressively worse, and he feels his current symptoms especially dizziness is related to increased dose of venetoclax. Patient reports nausea and poor p.o. intake since starting venetoclax.  Oncology to see pt today to discuss possible palliative radiation. Pt reports his appetite is off and on due to nausea. RD observed subway sub at bedside. Pt reports he was able to tolerate 1/2 of this last night. No meal completions have been charted since 6/1. Pt has drank two Ensures/day since admit. Will increase these to three to maximize  calories and protein. A recent weight has not been obtained since last RD visit. Nutrition-Focused physical exam completed.   Medications reviewed and include: MVI with minerals Labs reviewed.   NUTRITION - FOCUSED PHYSICAL EXAM:    Most Recent Value  Orbital Region  No depletion  Upper Arm Region  Moderate depletion  Thoracic and Lumbar Region  Unable to assess  Buccal Region  No depletion  Temple Region  Moderate depletion  Clavicle Bone Region  Mild depletion  Clavicle and Acromion Bone Region  Moderate depletion  Scapular Bone Region  Unable to assess  Dorsal Hand  Moderate depletion  Patellar Region  Moderate depletion  Anterior Thigh Region  Moderate depletion  Posterior Calf Region  Moderate depletion  Edema (RD Assessment)  None  Hair  Reviewed  Eyes  Reviewed  Mouth  Reviewed  Skin  Reviewed  Nails  Reviewed     Diet Order:   Diet Order           Diet regular Room service appropriate? Yes; Fluid consistency: Thin  Diet effective now          EDUCATION NEEDS:   No education needs have been identified at this time  Skin:  Skin Assessment: Reviewed RN Assessment  Last BM:  03/07/18  Height:   Ht Readings from Last 1 Encounters:  02/28/2018 5\' 9"  (1.753 m)    Weight:   Wt Readings from Last 1 Encounters:  03/03/2018 182 lb 5.1 oz (82.7 kg)    Ideal Body Weight:  72.73 kg  BMI:  Body mass index is 26.92  kg/m.  Estimated Nutritional Needs:   Kcal:  4037-5436 (26-28 kcal/kg)  Protein:  116-132 grams (1.4-1.6 grams/kg)  Fluid:  >/= 2.1 L/day    Mariana Single RD, LDN Clinical Nutrition Pager # - [phone]

## 2018-03-11 ENCOUNTER — Ambulatory Visit
Admit: 2018-03-11 | Discharge: 2018-03-11 | Disposition: A | Discharge status: Home / Self Care | Payer: Medicare Other | Attending: Radiation Oncology | Admitting: Radiation Oncology

## 2018-03-11 DIAGNOSIS — K5900 Constipation, unspecified: Secondary | ICD-10-CM

## 2018-03-11 DIAGNOSIS — C8318 Mantle cell lymphoma, lymph nodes of multiple sites: Secondary | ICD-10-CM

## 2018-03-11 DIAGNOSIS — J910 Malignant pleural effusion: Secondary | ICD-10-CM

## 2018-03-11 DIAGNOSIS — M7989 Other specified soft tissue disorders: Secondary | ICD-10-CM

## 2018-03-11 MED ORDER — ALUM & MAG HYDROXIDE-SIMETH 200-200-20 MG/5ML PO SUSP
30.0000 mL | ORAL | Status: DC | PRN
  Administered 2018-03-11: 30 mL via ORAL
  Filled 2018-03-11: qty 30

## 2018-03-11 MED ORDER — MAGNESIUM CITRATE PO SOLN
1.0000 | Freq: Once | ORAL | Status: DC
  Filled 2018-03-11: qty 296

## 2018-03-11 MED ORDER — ALBUMIN HUMAN 25 % IV SOLN
25.0000 g | Freq: Once | INTRAVENOUS | Status: AC
  Administered 2018-03-11: 25 g via INTRAVENOUS
  Filled 2018-03-11: qty 100

## 2018-03-11 NOTE — Evaluation (Signed)
Occupational Therapy Evaluation Patient Details Name: Kristopher Johnson MRN: [id number] DOB: Jul 30, 1956 Today's Date: 03/11/2018    History of Present Illness This 62 year old man was admitted with dizziness; SOB with exertion.  PMH:  lymphoma, recurrent malignant pleural effusion and legally blind   Clinical Impression   Pt was admitted for the above. He reports that he is usually independent with adls.  He has 24/7 at home.  He now gets SOB with exertion. He states that he takes rest breaks because he has to, sits for adls and doesn't bend down as medication causes dizziness.  Will follow in acute setting with supervision level goals and will continue to reinforce energy conservation    Follow Up Recommendations  Supervision/Assistance - 24 hour    Equipment Recommendations  (to be further assessed)    Recommendations for Other Services       Precautions / Restrictions Precautions Precautions: Fall Restrictions Weight Bearing Restrictions: No      Mobility Bed Mobility               General bed mobility comments: eob  Transfers Overall transfer level: Needs assistance Equipment used: 1 person hand held assist Transfers: Sit to/from Stand Sit to Stand: Min guard         General transfer comment: unsteady    Balance Overall balance assessment: Needs assistance           Standing balance-Leahy Scale: Fair Standing balance comment: min guard for safety.  Min A when ambulating                           ADL either performed or assessed with clinical judgement   ADL Overall ADL's : Needs assistance/impaired Eating/Feeding: Set up Eating/Feeding Details (indicate cue type and reason): poor appetite Grooming: Set up;Sitting   Upper Body Bathing: Set up;Sitting   Lower Body Bathing: Moderate assistance;Sit to/from stand   Upper Body Dressing : Set up;Sitting   Lower Body Dressing: Moderate assistance;Sit to/from stand   Toilet Transfer:  Minimal assistance;Ambulation(back to bed)   Toileting- Clothing Manipulation and Hygiene: Minimal assistance;Sit to/from stand         General ADL Comments: pt unsteady.  HR 120s this am.  Talked about energy conservation:  he states he has to do most of the strategies anyway.  He has been able to stand in shower but just lets water run over his legs. Pt fatiques easily     Vision Baseline Vision/History: Legally blind       Perception     Praxis      Pertinent Vitals/Pain Pain Assessment: No/denies pain     Hand Dominance     Extremity/Trunk Assessment Upper Extremity Assessment Upper Extremity Assessment: Generalized weakness           Communication Communication Communication: No difficulties   Cognition Arousal/Alertness: Awake/alert Behavior During Therapy: WFL for tasks assessed/performed Overall Cognitive Status: Within Functional Limits for tasks assessed                                     General Comments       Exercises     Shoulder Instructions      Home Living Family/patient expects to be discharged to:: Private residence Living Arrangements: Children                 Bathroom  Shower/Tub: Teacher, early years/pre: Standard     Home Equipment: None   Additional Comments: lives with daughter/granddaughter: someone is always home with him      Prior Functioning/Environment Level of Independence: Independent                 OT Problem List: Decreased strength;Decreased activity tolerance;Impaired balance (sitting and/or standing);Impaired vision/perception;Cardiopulmonary status limiting activity      OT Treatment/Interventions: Self-care/ADL training;Energy conservation;DME and/or AE instruction;Patient/family education;Balance training;Therapeutic activities    OT Goals(Current goals can be found in the care plan section) Acute Rehab OT Goals Patient Stated Goal: none stated OT Goal Formulation:  With patient Time For Goal Achievement: 03/25/18 Potential to Achieve Goals: Good ADL Goals Pt Will Perform Lower Body Bathing: sit to/from stand;with supervision Pt Will Perform Lower Body Dressing: sit to/from stand;with supervision Pt Will Transfer to Toilet: with min guard assist;ambulating;regular height toilet;bedside commode Pt Will Perform Tub/Shower Transfer: Tub transfer;with min guard assist;ambulating;tub bench;shower seat  OT Frequency: Min 2X/week   Barriers to D/C:            Co-evaluation              AM-PAC PT "6 Clicks" Daily Activity     Outcome Measure Help from another person eating meals?: A Little Help from another person taking care of personal grooming?: A Little Help from another person toileting, which includes using toliet, bedpan, or urinal?: A Little Help from another person bathing (including washing, rinsing, drying)?: A Lot Help from another person to put on and taking off regular upper body clothing?: A Little Help from another person to put on and taking off regular lower body clothing?: A Lot 6 Click Score: 16   End of Session    Activity Tolerance: Patient limited by fatigue Patient left: in bed;with call bell/phone within reach  OT Visit Diagnosis: Unsteadiness on feet (R26.81);Muscle weakness (generalized) (M62.81)                Time: 1696-7893 OT Time Calculation (min): 19 min Charges:  OT General Charges $OT Visit: 1 Visit OT Evaluation $OT Eval Low Complexity: 1 Low G-Codes:     [place], OTR/L [phone] 03/11/2018  Bridney Guadarrama 03/11/2018, 9:25 AM

## 2018-03-11 NOTE — Progress Notes (Signed)
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  Denies any fever/chills, cough, chest pain, no leg swelling.  Patient has home health nurse who comes to drain his pleural fluid every 48 hours, patient reports pleural effusion seems to be worsening.  Patient attributed all his new symptoms to his medication venetoclax which has been gradually increased by his oncologist Dr. Irene Limbo who later recommended patient stop the medication.  In the ED, chest x-ray showed left-sided pleural effusion, patient was orthostatic positive.  Patient admitted for further management.  Today, patient reported feeling comfortable post drainage. Pleurx site pain controlled. Reported constipation. Denies any fever/chills, cough, chest pain, nausea/vomiting.  Assessment/Plan: Principal Problem:   Pleural effusion, left Active Problems:   Mantle cell lymphoma of lymph nodes of multiple regions ([place])   Legally blind   Port-A-Cath in place   Malignant pleural effusion   Palliative care encounter   Malignant left-sided pleural effusion status post Pleurx catheter Afebrile, resolved leukocytosis Chest x-ray showed left pleural effusion, ??  Rapid accumulation versus loculation CT chest showed: Progressive opacification of left hemithorax secondary to nodular pleural disease and large effusion Repeat CT angio done on 03/09/18: negative for PE, large left pleural effusion filling the left hemithorax resulting in significant collapse of the left lung and shifting the heart and  mediastinum to the right.  Spoke to the RN to be more aggressive with drainage via pleurX (been doing only about 1L daily). If pt becomes hypotensive or very dizzy, will give IV albumin (nursing order placed) Spoke to Dr Isidore Moos with radiation oncology on 03/08/18, someone from the team will see pt on 03/11/18. So far, no radiation oncologist has seen pt, for possible palliative radiation treatment Palliative team on board Pain management  Orthostatic hypotension/dizziness Noted to be orthostatic, will give IV albumin Likely multifactorial: Dehydration, malignancy, recurrent pleural effusion and drainage S/P IVF, continue oral hydration, IV albumin prn  Mantle cell lymphoma On chemotherapy, ibrutinib, was told to hold venetoclax by oncologist Oncologist Dr Irene Limbo on board, appreciate rec Palliative consulted   Code Status: DNR  Family Communication: None at bedside  Disposition Plan: Home once stable   Consultants:  Oncology  Spoke to Rad Onc  Procedures:  None  Antimicrobials:  None  DVT prophylaxis: Lovenox   Objective: Vitals:   03/10/18 1336 03/10/18 2048 03/11/18 0425 03/11/18 1402  BP: 112/65 119/63 (!) 110/56 125/68  Pulse: 96 (!) 102 (!) 108 (!) 114  Resp: 19 18 16 18   Temp: 97.7 F (36.5 C) 98.3 F (36.8 C) 98.2 F (36.8 C) 97.6 F (36.4 C)  TempSrc: Oral  Oral Oral  SpO2: 92% 93% 94% 90%  Weight:      Height:        Intake/Output Summary (Last 24 hours) at 03/11/2018 1713 Last data filed at 03/11/2018 1300 Gross per 24 hour  Intake 360 ml  Output 1200 ml  Net -840 ml   Filed Weights   03/02/2018 2055  Weight: 82.7 kg (182 lb 5.1 oz)    Exam:   General: NAD  Cardiovascular: S1, S2  present  Respiratory: Decreased breath sounds on left lung  Abdomen: Soft, nontender, nondistended, bowel sounds present  Musculoskeletal: No pedal edema bilaterally  Skin: Normal  Psychiatry: Normal mood   Data Reviewed: CBC: Recent Labs  Lab  03/05/18 1507 03/06/18 0505 03/07/18 0426 03/08/18 0437 03/09/18 0437  WBC 7.9 10.8* 7.2 7.9 7.7  NEUTROABS 6.6 8.2* 5.2 6.0 6.9  HGB 12.6* 13.4 13.0 13.2 12.5*  HCT 40.0 42.6 41.4 42.2 40.4  MCV 91.3 92.2 91.4 92.7 92.2  PLT 194 220 187 191 096   Basic Metabolic Panel: Recent Labs  Lab 03/05/18 1507 03/06/18 0505 03/07/18 0426 03/08/18 0437 03/09/18 0437  NA 140 139 139 138 137  K 3.9 4.0 4.3 3.8 4.1  CL 103 102 102 101 99*  CO2 24 23 25 25 26   GLUCOSE 116* 179* 130* 145* 133*  BUN 12 13 14 16 15   CREATININE 0.83 0.87 0.87 0.88 0.84  CALCIUM 8.6* 9.1 9.0 8.9 8.9  PHOS 2.5 2.6 3.0 2.5 2.8   GFR: Estimated Creatinine Clearance: 88.8 mL/min (by C-G formula based on SCr of 0.84 mg/dL). Liver Function Tests: Recent Labs  Lab 03/05/18 1507 03/06/18 0505 03/07/18 0426 03/08/18 0437 03/09/18 0437  ALBUMIN 2.8* 3.1* 2.8* 2.8* 3.2*   No results for input(s): LIPASE, AMYLASE in the last 168 hours. No results for input(s): AMMONIA in the last 168 hours. Coagulation Profile: No results for input(s): INR, PROTIME in the last 168 hours. Cardiac Enzymes: Recent Labs  Lab 02/05/2018 2203 03/05/18 0342  TROPONINI <0.03 <0.03   BNP (last 3 results) No results for input(s): PROBNP in the last 8760 hours. HbA1C: No results for input(s): HGBA1C in the last 72 hours. CBG: No results for input(s): GLUCAP in the last 168 hours. Lipid Profile: No results for input(s): CHOL, HDL, LDLCALC, TRIG, CHOLHDL, LDLDIRECT in the last 72 hours. Thyroid Function Tests: No results for input(s): TSH, T4TOTAL, FREET4, T3FREE, THYROIDAB in the last 72 hours. Anemia Panel: No results for input(s): VITAMINB12, FOLATE, FERRITIN, TIBC, IRON, RETICCTPCT in the last 72 hours. Urine analysis:    Component Value Date/Time   COLORURINE YELLOW 02/08/2018 1620   APPEARANCEUR CLEAR 02/23/2018 1620   LABSPEC 1.026 02/16/2018 1620   PHURINE 6.0 02/09/2018 1620   GLUCOSEU NEGATIVE 02/20/2018 1620    HGBUR SMALL (A) 02/10/2018 1620   BILIRUBINUR NEGATIVE 02/14/2018 1620   KETONESUR 5 (A) 02/18/2018 1620   PROTEINUR 30 (A) 02/16/2018 1620   NITRITE NEGATIVE 03/07/2018 1620   LEUKOCYTESUR NEGATIVE 03/07/2018 1620   Sepsis Labs: @LABRCNTIP (procalcitonin:4,lacticidven:4)  )No results found for this or any previous visit (from the past 240 hour(s)).    Studies: No results found.  Scheduled Meds: . chlorhexidine  15 mL Mouth Rinse BID  . enoxaparin (LOVENOX) injection  40 mg Subcutaneous Q24H  . feeding supplement (ENSURE ENLIVE)  237 mL Oral TID BM  . Ibrutinib  560 mg Oral Daily  . mouth rinse  15 mL Mouth Rinse q12n4p  . multivitamin with minerals  1 tablet Oral Daily  . polyethylene glycol  17 g Oral Daily  . senna-docusate  1 tablet Oral BID    Continuous Infusions:   LOS: 4 days     Alma Friendly, MD [facility]  If 7PM-7AM, please contact night-coverage [email or web address] Password [email or web address] 03/11/2018, 5:13 PM

## 2018-03-11 NOTE — Progress Notes (Signed)
HEMATOLOGY/ONCOLOGY INPATIENT PROGRESS NOTE  Date of Service: 03/11/2018  Inpatient Attending: .Alma Friendly, MD   SUBJECTIVE:    The pt reports that he had 2 L of pleural fluid removed yesterday, and one liter removed today. He has been feeling better after stopping the Venetoclax. He also notes that he has not been able to eat much or drink much. He has been drinking 0.5 a bottle ensure each day, had a bowel movement this morning but has felt constipated. He has magnesium citrate for his constipation.   He notes that he is unsure if he wants to continue with his treatment and we had a lengthy discussion about what his current goals of treatment are. He will continue thinking about these.  He was seen by radiation oncology for rapidly recurring malignant pleural effusion and has been offered and has agreed to start palliative RT.  On review of systems, pt reports constipation, lack of appetite, mild leg swelling, nausea, and denies skin rashes, abdominal pains, and any other symptoms.    OBJECTIVE:  Mild respiratory distress  PHYSICAL EXAMINATION: . Vitals:   03/11/18 1402 03/11/18 2046 03/12/18 0406 03/12/18 1331  BP: 125/68 128/73 (!) 119/58 111/69  Pulse: (!) 114 (!) 105 95 98  Resp: 18 18 18 12   Temp: 97.6 F (36.4 C) (!) 97.5 F (36.4 C) 98.4 F (36.9 C) 97.9 F (36.6 C)  TempSrc: Oral Oral  Oral  SpO2: 90% 92% 96% 91%  Weight:      Height:       Filed Weights   02/05/2018 2055  Weight: 182 lb 5.1 oz (82.7 kg)   .Body mass index is 26.92 kg/m.  GENERAL:alert, in no acute distress and comfortable SKIN: no acute rashes, no significant lesions EYES: conjunctiva are pink and non-injected, sclera anicteric OROPHARYNX: MMM, no exudates, no oropharyngeal erythema or ulceration NECK: supple, no JVD LYMPH:  no palpable lymphadenopathy in the cervical, axillary or inguinal regions LUNGS: decreased breath sounds left lung fields HEART: regular rate &  rhythm ABDOMEN:  normoactive bowel sounds , non tender, not distended. Extremity: no pedal edema PSYCH: alert & oriented x 3 with fluent speech NEURO: no focal motor/sensory deficits   MEDICAL HISTORY:  Past Medical History:  Diagnosis Date  . Abscess of arm, right 10/27/2015  . 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 Shortness of breath 10/27/2015    SURGICAL HISTORY: Past Surgical History:  Procedure Laterality Date  . Cataract surgery Bilateral    Patient notes she has had bilateral Surgery in 1998 and 99  . FLEXIBLE SIGMOIDOSCOPY N/A 03/01/2017   Procedure: FLEXIBLE SIGMOIDOSCOPY;  Surgeon: Manus Gunning, MD;  Location: [facility];  Service: Gastroenterology;  Laterality: N/A;  . IR GUIDED [place]  02/20/2018  . TONSILLECTOMY     about 62years old    SOCIAL HISTORY: Social History   Socioeconomic History  . Marital status: Widowed    Spouse name: Not on file  . Number of children: Not on file  . Years of education: Not on file  . Highest education level: Not on file  Occupational History  . Not on file  Social Needs  . Financial resource strain: Not on file  . Food insecurity:    Worry: Not on file    Inability: Not on file  .  Transportation needs:    Medical: Not on file    Non-medical: Not on file  Tobacco Use  . Smoking status: Current Every Day Smoker    Packs/day: 0.50    Years: 33.00    Pack years: 16.50    Types: Cigarettes  . Smokeless tobacco: Never Used  Substance and Sexual Activity  . Alcohol use: No  . Drug use: No  . Sexual activity: Not on file    Comment: Disabled, retinis pigmentosa legally blind, 4children MPOA Lynnn(cousin)  Lifestyle  . Physical activity:    Days per week: Not on file    Minutes per session: Not on file  . Stress: Not on file   Relationships  . Social connections:    Talks on phone: Not on file    Gets together: Not on file    Attends religious service: Not on file    Active member of club or organization: Not on file    Attends meetings of clubs or organizations: Not on file    Relationship status: Not on file  . Intimate partner violence:    Fear of current or ex partner: Not on file    Emotionally abused: Not on file    Physically abused: Not on file    Forced sexual activity: Not on file  Other Topics Concern  . Not on file  Social History Narrative  . Not on file    FAMILY HISTORY: Family History  Problem Relation Age of Onset  . Colon cancer Neg Hx     ALLERGIES:  has No Known Allergies.  MEDICATIONS:  Scheduled Meds: . chlorhexidine  15 mL Mouth Rinse BID  . enoxaparin (LOVENOX) injection  40 mg Subcutaneous Q24H  . feeding supplement (ENSURE ENLIVE)  237 mL Oral TID BM  . Ibrutinib  560 mg Oral Daily  . mouth rinse  15 mL Mouth Rinse q12n4p  . multivitamin with minerals  1 tablet Oral Daily  . polyethylene glycol  17 g Oral Daily  . senna-docusate  1 tablet Oral BID   Continuous Infusions: PRN Meds:.alum & mag hydroxide-simeth, HYDROcodone-acetaminophen, ondansetron **OR** ondansetron (ZOFRAN) IV, sodium chloride flush, traMADol  REVIEW OF SYSTEMS:    A 10+ POINT REVIEW OF SYSTEMS WAS OBTAINED including neurology, dermatology, psychiatry, cardiac, respiratory, lymph, extremities, GI, GU, Musculoskeletal, constitutional, breasts, reproductive, HEENT.  All pertinent positives are noted in the HPI.  All others are negative.   LABORATORY DATA:  I have reviewed the data as listed  . CBC Latest Ref Rng & Units 03/09/2018 03/08/2018 03/07/2018  WBC 4.0 - 10.5 K/uL 7.7 7.9 7.2  Hemoglobin 13.0 - 17.0 g/dL 12.5(L) 13.2 13.0  Hematocrit 39.0 - 52.0 % 40.4 42.2 41.4  Platelets 150 - 400 K/uL 172 191 187    . CMP Latest Ref Rng & Units 03/09/2018 03/08/2018 03/07/2018  Glucose 65 - 99 mg/dL  133(H) 145(H) 130(H)  BUN 6 - 20 mg/dL 15 16 14   Creatinine 0.61 - 1.24 mg/dL 0.84 0.88 0.87  Sodium 135 - 145 mmol/L 137 138 139  Potassium 3.5 - 5.1 mmol/L 4.1 3.8 4.3  Chloride 101 - 111 mmol/L 99(L) 101 102  CO2 22 - 32 mmol/L 26 25 25   Calcium 8.9 - 10.3 mg/dL 8.9 8.9 9.0  Total Protein 6.5 - 8.1 g/dL - - -  Total Bilirubin 0.3 - 1.2 mg/dL - - -  Alkaline Phos 38 - 126 U/L - - -  AST 15 - 41 U/L - - -  ALT 17 - 63 U/L - - -     RADIOGRAPHIC STUDIES: I have personally reviewed the radiological images as listed and agreed with the findings in the report. Dg Chest 1 View  Result Date: 02/14/2018 CLINICAL DATA:  Status post LEFT thoracentesis today. EXAM: CHEST  1 VIEW COMPARISON:  02/06/2018 and prior studies FINDINGS: Decreased size of loculated LEFT pleural effusion noted. There is no evidence of pneumothorax. LEFT LOWER lung atelectasis/opacity again noted. A RIGHT Port-A-Cath with tip overlying the LOWER SVC again noted. The RIGHT lung is clear. IMPRESSION: Decreased loculated LEFT pleural effusion. No evidence of pneumothorax. Electronically Signed   By: Margarette Canada M.D.   On: 02/14/2018 12:55   Dg Chest 2 View  Result Date: 03/01/2018 CLINICAL DATA:  History of lung carcinoma with shortness of Breath EXAM: CHEST - 2 VIEW COMPARISON:  02/20/2018 PleurX, 02/14/2018 FINDINGS: Cardiac shadow is obscured by a significantly enlarged left-sided pleural effusion. A left PleurX catheter is noted in place. Minimal left lung aeration is noted. Right chest wall port is seen in satisfactory position. Right lung is clear. No bony abnormality is noted. IMPRESSION: Enlarging left-sided pleural effusion. Electronically Signed   By: Inez Catalina M.D.   On: 02/13/2018 17:00   Ct Chest Wo Contrast  Result Date: 03/05/2018 CLINICAL DATA:  Shortness of breath, cough. EXAM: CT CHEST WITHOUT CONTRAST TECHNIQUE: Multidetector CT imaging of the chest was performed following the standard protocol without IV  contrast. COMPARISON:  PET-CT 02/05/2018 and previous FINDINGS: Cardiovascular: Right IJ port catheter to the distal SVC. Heart size normal. No pericardial effusion. Mediastinum/Nodes: Left supraclavicular, anterior mediastinal, right paratracheal, and subcarinal adenopathy as before. There is mild rightward mediastinal shift secondary to left pleural disease. Anterior left pleural mass is probably associated with mediastinal invasion. Lungs/Pleura: Progressive, near complete opacification of the left lung. Large left pleural effusion with good position of tunneled drain catheter. Extensive pleural thickening and masses. Right lung clear. Upper Abdomen: Stable epigastric adenopathy. Stable probable cyst in left hepatic lobe. No acute findings. Musculoskeletal: Spondylitic changes in the visualized lower cervical spine. No fracture or worrisome bone lesion. IMPRESSION: 1. Progressive opacification of left hemithorax secondary to nodular pleural disease and large effusion. 2. The tunneled left pleural drain catheter appears appropriately positioned, suggesting the patient may benefit from more aggressive thoracentesis. 3. Persistent left supraclavicular, mediastinal, and epigastric adenopathy. Electronically Signed   By: Lucrezia Europe M.D.   On: 03/05/2018 14:58   Ct Angio Chest Pe W Or Wo Contrast  Result Date: 03/09/2018 CLINICAL DATA:  62 year old male with stage IV lymphoma. Recurrent malignant effusion with catheter placed Feb 20, 2018. Dizziness and shortness of breath. EXAM: CT ANGIOGRAPHY CHEST WITH CONTRAST TECHNIQUE: Multidetector CT imaging of the chest was performed using the standard protocol during bolus administration of intravenous contrast. Multiplanar CT image reconstructions and MIPs were obtained to evaluate the vascular anatomy. CONTRAST:  11mL ISOVUE-370 IOPAMIDOL (ISOVUE-370) INJECTION 76% COMPARISON:  Chest CT Mar 05, 2018 FINDINGS: Cardiovascular: The heart mediastinum are shifted to the  right due to a large left pleural effusion which will be described below. The thoracic aorta is normal in caliber with no atherosclerosis, aneurysm, or dissection. The heart size is normal and stable. No obvious coronary artery calcifications. Evaluation of pulmonary arterial branches on the right is limited due to significant atelectatic lung due to the large right pleural effusion. No pulmonary emboli identified. Mediastinum/Nodes: There is a large left pleural effusion filling nearly the entire left hemithorax resulting  in significant atelectatic/collapsed lung on the left. Only minimal aerated lung remains on the left. The effusion results in shift of the heart mediastinum to the right. There is diffuse thickening of the right-sided pleura with pleural based masses remaining. The largest anterior pleural base mass on series 4, image 27 extends in the anterior chest wall, stable. No right-sided pleural effusion. No pericardial effusion. No right axillary adenopathy. Mildly enlarged nodes are seen in the base of the neck, particularly on the left. A 14 mm node is seen on image 5 in the base of the neck on the left. Left axillary and retropectoral adenopathy remains, similar in the interval. There is skin thickening and edema associated with the left chest wall including the breast. This does not appear to be confined to the breast. The chest wall is otherwise unremarkable. Adenopathy remains in the mediastinum. The esophagus and thyroid are unremarkable. Lungs/Pleura: There is only minimal aerated lung on the left. The right lung is free of infiltrate, nodule, or mass. No pneumothorax. The central airways are unremarkable. Upper Abdomen: Mild adenopathy remains in the upper abdomen. Musculoskeletal: No chest wall abnormality. No acute or significant osseous findings. Review of the MIP images confirms the above findings. IMPRESSION: 1. No pulmonary emboli identified. Evaluation of left-sided pulmonary arterial  branches is limited due to near complete collapse of the left lung secondary to a large left pleural effusion. 2. There is a large left pleural effusion filling the left hemithorax resulting in significant collapse of the left lung. The pleura is thickened and there are multiple pleural-based masses, unchanged. The large left effusion shifts the heart and mediastinum to the right. 3. Adenopathy in the left axilla, base of neck, left greater than right, mediastinum, and upper abdomen consistent with the patient's known lymphoma. Electronically Signed   By: Dorise Bullion III M.D   On: 03/09/2018 08:50   Dg Chest Left Decubitus  Result Date: 02/19/2018 CLINICAL DATA:  History left pleural effusion and prior tunneled PleurX catheter placement. Possible enlarging loculated pleural effusion by chest x-ray. EXAM: CHEST - LEFT DECUBITUS COMPARISON:  Chest x-ray earlier today. FINDINGS: It is difficult to determine by a decubitus chest x-ray whether there is a significant component of pleural fluid. It does not appear that there is a large amount layering fluid, but there may be some layering component. Underlying atelectasis and/or consolidation of the lower lung is suspected. IMPRESSION: It is difficult to tell by decubitus chest x-ray whether there is a significant component of pleural fluid present. A CT of the chest would be needed to determine whether fluid is present and if so, its relationship to the existing tunneled PleurX drainage catheter. Electronically Signed   By: Aletta Edouard M.D.   On: 03/05/2018 20:41   Ir Guided Niel Hummer W Catheter Placement  Result Date: 02/21/2018 INDICATION: 62 year old male with a history of recurrent malignant left-sided pleural effusion. EXAM: IMAGE GUIDED TUNNELED PLEURAL CATHETER MEDICATIONS: 2.0 g Ancef. The antibiotics were administered within an appropriate time frame prior to the initiation of the procedure. ANESTHESIA/SEDATION: Fentanyl 100 mcg IV; Versed 2.0 mg IV  Moderate Sedation Time:  13 minutes The patient was continuously monitored during the procedure by the interventional radiology nurse under my direct supervision. COMPLICATIONS: None PROCEDURE: The procedure, risks, benefits, and alternatives were explained to the patient and the patient's family. Specific risks that were addressed included bleeding, infection, pneumothorax, need for further procedure, chance of delayed pneumothorax or hemorrhage, hemoptysis, cardiopulmonary collapse, death. Questions regarding the  procedure were encouraged and answered. The patient understands and consents to the procedure. The left chest wall was prepped with Betadine in a sterile fashion, and a sterile drape was applied covering the operative field. A sterile gown and sterile gloves were used for the procedure. Local anesthesia was provided with 1% Lidocaine. Ultrasound image documentation was performed. After creating a small skin incision, a 19 gauge needle was advanced into the pleural cavity under ultrasound guidance. A guide wire was then advanced under fluoroscopy into the pleural space. Pleural access was dilated serially and a 16-French peel-away sheath placed. The skin and subcutaneous tissues were generously infiltrated with 1% lidocaine from the puncture site over the pleura along the intercostal margin anteriorly. A small stab incision was made with 11 blade scalpel at the insertion site of the catheter, and the catheter was back tunneled to the site at the pleural puncture. A tunneled CareFusion Pleurex catheter was placed. This was tunneled from the incision 5 cm anterior to the pleural access to the access site. The catheter was advanced through the peel-away sheath. The sheath was then removed. Final catheter positioning was confirmed with a fluoroscopic spot image. The access incision was closed with Dermabond, applied to the catheterization incision. Large volume thoracentesis was performed through the new  catheter utilizing gravity drainage bag. The patient tolerated the procedure well and remained hemodynamically stable throughout. No complications were encountered and no significant blood loss was encountered. IMPRESSION: Status post left-sided tunneled pleural catheter. Signed, Dulcy Fanny. Earleen Newport, DO [facility] Electronically Signed   By: Corrie Mckusick D.O.   On: 02/21/2018 08:45   US Thoracentesis Asp Pleural Space W/img Guide  Result Date: 02/14/2018 INDICATION: Patient with history of mantle cell lymphoma with recurrent symptomatic malignant left pleural effusion. Request made for therapeutic left thoracentesis. EXAM: ULTRASOUND GUIDED THERAPEUTIC LEFT THORACENTESIS MEDICATIONS: None COMPLICATIONS: None immediate. PROCEDURE: An ultrasound guided thoracentesis was thoroughly discussed with the patient and questions answered. The benefits, risks, alternatives and complications were also discussed. The patient understands and wishes to proceed with the procedure. Written consent was obtained. Ultrasound was performed to localize and mark an adequate pocket of fluid in the left chest. The area was then prepped and draped in the normal sterile fashion. 1% Lidocaine was used for local anesthesia. Under ultrasound guidance a 6 Fr Safe-T-Centesis catheter was introduced. Thoracentesis was performed. The catheter was removed and a dressing applied. FINDINGS: A total of approximately 2.4 liters of blood-tinged fluid was removed. IMPRESSION: Successful ultrasound guided therapeutic left thoracentesis yielding 2.4 liters of pleural fluid. Follow-up chest x-ray revealed no pneumothorax. Read by: Rowe Chevon, PA-C Electronically Signed   By: Jacqulynn Cadet M.D.   On: 02/14/2018 13:24    ASSESSMENT & PLAN:  62 y.o. 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 ECHO nl EF ECOG PS 2 but activities significantly limited due to being legally blind HIV/Hep C/Hep B neg  PET/CT scan after 3 cycles of R CHOP show good overall response. Stomach lesion still with very FDG uptake ? Residual lymphoma versus inflammation.  Patient is status post 6 cycles of R CHOP. PET/CT after 6 cycles shows resolution of hypermetabolic lymphadenopathy and gastric wall activity . Patient was on maintenance Rituxan for 1 year now prior to relapse. -Labs reviewed today (02/21/18) of CBC, CMP, Reticulocytes, Uric acid,  phosphorus, and LDH is as follows: all values are WNL except for CBC with WBC at 11.5. CMP with total protein at 5.6, albumin at 2.8. LDH decreased to 472.  -I discussed that we will monitor his counts and give IVF weekly as well as add on rituaxin.  -F/u as scheduled.   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 CT chest abdomen pelvis showed multiple areas of lymphadenopathy in the abdomen as well as in the chest suggesting significant recurrence. PET/CT scan 03/25/2017 shows diffuse involvement with mantle cell lymphoma involving the chest abdomen pelvis and bowels. 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 Workup showed malignant pleural effusion and b/l pleural involvement and rectal urgency due to mantle cell lymphoma causing mass in rectum/sigmoid -PET/CT 10/25/2017 - with significant mantle cell lymphoma progression again. -He was placed onIbrutinib 560 mg p.o. Daily in 10/2017 and has resolution of most of his symptoms and normalization of elevated LDH levels.  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 PET/CT 02/05/2018 with mixed treatment response. S/p pleurx catheter placement. 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 -continue draining left sided indwelling pleurex catheter with IV Albumin support as needed. -will likely need large volume drainage of his left pleural effusion thorugh indwelling catheter with IV albumin support -I had an honest discussing with the patient regarding progressively limited systemic treatment options. -continue Ibrutinib -holding Venetoclax at this time - his LDH has improved to 283 suggest response with regards to [name]. If he feels better after control of pleural effusion could consider restart the Venetoclax at a lower dose of 50mg . (and perhaps discontinue Ibrutinib for better tolerance). -continue Rituxan q3weeks -Optimize food intake  -Discussed seeing a Education officer, museum and power of attorney decisions    3) Retinitis pigmentosa causing legal blindness -  is seeing a retina specialist in town. No acute interventions at this time. Notes that his vision is progressively getting worse. His daughter and granddaughter are helping him out at home.    4) Weight loss and protein calorie malnutrition.  . Wt Readings from Last 3 Encounters:  03/03/2018 182 lb 5.1 oz (82.7 kg)  02/24/18 184 lb 12.8 oz (83.8 kg)  02/21/18 184 lb (83.5 kg)   . The total time spent in the appointment was 35 minutes and more than 50% was on counseling and direct patient cares.      Sullivan Lone MD MS AAHIVMS [facility] Hematology/Oncology Physician [facility]  (Office):       [phone] (Work cell):  [phone] (Fax):           [phone]  03/11/2018 4:11 PM  I, Baldwin [place], am acting as a Education administrator for Dr Irene Limbo.   .I have  reviewed the above documentation for accuracy and completeness, and I agree with the above. Sullivan Lone MD MS

## 2018-03-11 NOTE — Consult Note (Signed)
Radiation Oncology         [phone] ________________________________ Initial Inpatient Consultation Name: Kristopher Johnson        MRN: [id number]  Date of Service: 02/11/2018 DOB: Dec 18, 1955  WU:[id number], No Pcp Per  No ref. provider found     REFERRING PHYSICIAN: No ref. provider found   DIAGNOSIS: The primary encounter diagnosis was Pleural effusion on left. Diagnoses of Pleural effusion, left and Orthostasis were also pertinent to this visit.   HISTORY OF PRESENT ILLNESS: Kristopher Johnson is a 62 y.o. male seen at the request of Dr. Horris Latino.  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 Irene Limbo.  Status post R-CHOP 6 cycles followed by maintenance Rituxan x 1 year with good disease control until relapse in 03/2017. He is currently on Ibrutinib 560 mg daily  + Venetoclax + Rituxan since 02/2018.  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  Since placement, the Pleurx catheter has continued to drain 1L pleural fluid every 48 hours.  He was recently admitted to the hospital on 03/01/2018 with complaints of dizziness, progressive weakness/fatigue and SOB. 1L fluid drained from Pleurx catheter on admission. Effusion appears to be reaccumulating faster than it can be drained via indwelling Pleurx.  Therefore, we have been consulted for consideration of palliative radiotherapy to help manage the rapidly accumulating left pleural effusion.    PREVIOUS RADIATION THERAPY: No   PAST MEDICAL HISTORY:  Past Medical History:  Diagnosis Date  . Abscess of arm, right 10/27/2015  . Lymphoma of lymph nodes of multiple sites ([place]) 10/27/2015  . 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 Shortness of  breath 10/27/2015       PAST SURGICAL HISTORY: Past Surgical History:  Procedure Laterality Date  . Cataract surgery Bilateral    Patient notes she has had bilateral Surgery in 1998 and 99  . FLEXIBLE SIGMOIDOSCOPY N/A 03/01/2017   Procedure: FLEXIBLE SIGMOIDOSCOPY;  Surgeon: Manus Gunning, MD;  Location: [facility];  Service: Gastroenterology;  Laterality: N/A;  . IR GUIDED [place]  02/20/2018  . TONSILLECTOMY     about 62years old     FAMILY HISTORY:  Family History  Problem Relation Age of Onset  . Colon cancer Neg Hx      SOCIAL HISTORY:  reports that he has been smoking cigarettes.  He has a 16.50 pack-year smoking history. He has never used smokeless tobacco. He reports that he does not drink alcohol or use drugs.   ALLERGIES: Patient has no known allergies.   MEDICATIONS:  Current Facility-Administered Medications  Medication Dose Route Frequency Provider Last Rate Last Dose  . alum & mag hydroxide-simeth (MAALOX/MYLANTA) 200-200-20 MG/5ML suspension 30 mL  30 mL Oral Q4H PRN Alma Friendly, MD   30 mL at 03/11/18 0139  . chlorhexidine (PERIDEX) 0.12 % solution 15 mL  15 mL Mouth Rinse BID Emokpae, Ejiroghene E, MD   15 mL at 03/11/18 1113  . enoxaparin (LOVENOX) injection 40 mg  40 mg Subcutaneous Q24H Alma Friendly, MD      . feeding supplement (ENSURE ENLIVE) (ENSURE ENLIVE) liquid 237 mL  237 mL Oral TID BM Alma Friendly, MD  237 mL at 03/11/18 1558  . HYDROcodone-acetaminophen (NORCO/VICODIN) 5-325 MG per tablet 1-2 tablet  1-2 tablet Oral Q4H PRN Alma Friendly, MD   2 tablet at 03/11/18 1557  . Ibrutinib TABS 560 mg  560 mg Oral Daily Emokpae, Ejiroghene E, MD   560 mg at 03/11/18 1113  . magnesium citrate solution 1 Bottle  1 Bottle Oral Once Alma Friendly, MD      . MEDLINE mouth rinse  15 mL Mouth Rinse q12n4p Emokpae, Ejiroghene E, MD   15 mL at 03/11/18 1339  . multivitamin with minerals tablet 1  tablet  1 tablet Oral Daily Alma Friendly, MD   1 tablet at 03/11/18 1112  . ondansetron (ZOFRAN) tablet 4 mg  4 mg Oral Q6H PRN Emokpae, Ejiroghene E, MD   4 mg at 03/07/18 0855   Or  . ondansetron (ZOFRAN) injection 4 mg  4 mg Intravenous Q6H PRN Emokpae, Ejiroghene E, MD   4 mg at 03/11/18 1343  . polyethylene glycol (MIRALAX / GLYCOLAX) packet 17 g  17 g Oral Daily Alma Friendly, MD   Stopped at 03/11/18 1202  . senna-docusate (Senokot-S) tablet 1 tablet  1 tablet Oral BID Alma Friendly, MD   Stopped at 03/11/18 1203  . sodium chloride flush (NS) 0.9 % injection 10-40 mL  10-40 mL Intracatheter PRN Emokpae, Ejiroghene E, MD   10 mL at 03/10/18 5027  . traMADol (ULTRAM) tablet 100 mg  100 mg Oral Q6H PRN Alma Friendly, MD   100 mg at 03/10/18 2320     REVIEW OF SYSTEMS: On review of systems, the patient reports that he is doing well overall. He continues with fatigue and SOB but reports this is improved since the time of his admission.  He is still unable to ambulate more than a few steps at a time before becoming SOB. He currently denies any chest pain, cough, fevers, chills, or night sweats. He has continued to lose weigh due to decreased appetite which he attributes to the Venetoclax.  He denies any bowel or bladder disturbances, and denies abdominal pain, nausea or vomiting. He denies any new musculoskeletal or joint aches or pains. A complete review of systems is obtained and is otherwise negative.     PHYSICAL EXAM:  Wt Readings from Last 3 Encounters:  02/19/2018 182 lb 5.1 oz (82.7 kg)  02/24/18 184 lb 12.8 oz (83.8 kg)  02/21/18 184 lb (83.5 kg)   Temp Readings from Last 3 Encounters:  03/11/18 (!) 97.5 F (36.4 C) (Oral)  02/24/18 97.9 F (36.6 C) (Oral)  02/21/18 97.8 F (36.6 C) (Oral)   BP Readings from Last 3 Encounters:  03/11/18 128/73  02/24/18 118/82  02/21/18 111/73   Pulse Readings from Last 3 Encounters:  03/11/18 (!) 105  02/24/18  93  02/21/18 (!) 108   Pain Assessment Pain Score: 1 /10  In general this is a well appearing African American male in no acute distress. He is alert and oriented x4 and appropriate throughout the examination. HEENT reveals that the patient is normocephalic, atraumatic. EOMs are intact. PERRLA. Skin is intact without any evidence of gross lesions. Cardiovascular exam reveals a regular rate and rhythm, no clicks rubs or murmurs are auscultated. Lungs are with decreased breath sounds on the left, CTA on the right. Lymphatic assessment is performed and does not reveal any adenopathy in the cervical, supraclavicular, axillary, or inguinal chains. Abdomen has active bowel sounds in all quadrants  and is intact. The abdomen is soft, non tender, non distended. Lower extremities are negative for pretibial pitting edema, deep calf tenderness, cyanosis or clubbing.   ECOG = 3  0 - Asymptomatic (Fully active, able to carry on all predisease activities without restriction)  1 - Symptomatic but completely ambulatory (Restricted in physically strenuous activity but ambulatory and able to carry out work of a light or sedentary nature. For example, light housework, office work)  2 - Symptomatic, <50% in bed during the day (Ambulatory and capable of all self care but unable to carry out any work activities. Up and about more than 50% of waking hours)  3 - Symptomatic, >50% in bed, but not bedbound (Capable of only limited self-care, confined to bed or chair 50% or more of waking hours)  4 - Bedbound (Completely disabled. Cannot carry on any self-care. Totally confined to bed or chair)  5 - Death   Eustace Pen MM, Creech RH, Tormey DC, et al. [phone]). "Toxicity and response criteria of the [facility]". [place] Oncol. 5 (6): 649-55    LABORATORY DATA:  Lab Results  Component Value Date   WBC 7.7 03/09/2018   HGB 12.5 (L) 03/09/2018   HCT 40.4 03/09/2018   MCV 92.2 03/09/2018   PLT  172 03/09/2018   Lab Results  Component Value Date   NA 137 03/09/2018   K 4.1 03/09/2018   CL 99 (L) 03/09/2018   CO2 26 03/09/2018   Lab Results  Component Value Date   ALT 18 02/20/2018   AST 13 (L) 03/06/2018   ALKPHOS 78 02/06/2018   BILITOT 0.8 03/05/2018      RADIOGRAPHY: Dg Chest 1 View  Result Date: 02/14/2018 CLINICAL DATA:  Status post LEFT thoracentesis today. EXAM: CHEST  1 VIEW COMPARISON:  02/06/2018 and prior studies FINDINGS: Decreased size of loculated LEFT pleural effusion noted. There is no evidence of pneumothorax. LEFT LOWER lung atelectasis/opacity again noted. A RIGHT Port-A-Cath with tip overlying the LOWER SVC again noted. The RIGHT lung is clear. IMPRESSION: Decreased loculated LEFT pleural effusion. No evidence of pneumothorax. Electronically Signed   By: Margarette Canada M.D.   On: 02/14/2018 12:55   Dg Chest 2 View  Result Date: 03/03/2018 CLINICAL DATA:  History of lung carcinoma with shortness of Breath EXAM: CHEST - 2 VIEW COMPARISON:  02/20/2018 PleurX, 02/14/2018 FINDINGS: Cardiac shadow is obscured by a significantly enlarged left-sided pleural effusion. A left PleurX catheter is noted in place. Minimal left lung aeration is noted. Right chest wall port is seen in satisfactory position. Right lung is clear. No bony abnormality is noted. IMPRESSION: Enlarging left-sided pleural effusion. Electronically Signed   By: Inez Catalina M.D.   On: 02/24/2018 17:00   Ct Chest Wo Contrast  Result Date: 03/05/2018 CLINICAL DATA:  Shortness of breath, cough. EXAM: CT CHEST WITHOUT CONTRAST TECHNIQUE: Multidetector CT imaging of the chest was performed following the standard protocol without IV contrast. COMPARISON:  PET-CT 02/05/2018 and previous FINDINGS: Cardiovascular: Right IJ port catheter to the distal SVC. Heart size normal. No pericardial effusion. Mediastinum/Nodes: Left supraclavicular, anterior mediastinal, right paratracheal, and subcarinal adenopathy as  before. There is mild rightward mediastinal shift secondary to left pleural disease. Anterior left pleural mass is probably associated with mediastinal invasion. Lungs/Pleura: Progressive, near complete opacification of the left lung. Large left pleural effusion with good position of tunneled drain catheter. Extensive pleural thickening and masses. Right lung clear. Upper Abdomen: Stable epigastric adenopathy. Stable probable  cyst in left hepatic lobe. No acute findings. Musculoskeletal: Spondylitic changes in the visualized lower cervical spine. No fracture or worrisome bone lesion. IMPRESSION: 1. Progressive opacification of left hemithorax secondary to nodular pleural disease and large effusion. 2. The tunneled left pleural drain catheter appears appropriately positioned, suggesting the patient may benefit from more aggressive thoracentesis. 3. Persistent left supraclavicular, mediastinal, and epigastric adenopathy. Electronically Signed   By: Lucrezia Europe M.D.   On: 03/05/2018 14:58   Ct Angio Chest Pe W Or Wo Contrast  Result Date: 03/09/2018 CLINICAL DATA:  62 year old male with stage IV lymphoma. Recurrent malignant effusion with catheter placed Feb 20, 2018. Dizziness and shortness of breath. EXAM: CT ANGIOGRAPHY CHEST WITH CONTRAST TECHNIQUE: Multidetector CT imaging of the chest was performed using the standard protocol during bolus administration of intravenous contrast. Multiplanar CT image reconstructions and MIPs were obtained to evaluate the vascular anatomy. CONTRAST:  165mL ISOVUE-370 IOPAMIDOL (ISOVUE-370) INJECTION 76% COMPARISON:  Chest CT Mar 05, 2018 FINDINGS: Cardiovascular: The heart mediastinum are shifted to the right due to a large left pleural effusion which will be described below. The thoracic aorta is normal in caliber with no atherosclerosis, aneurysm, or dissection. The heart size is normal and stable. No obvious coronary artery calcifications. Evaluation of pulmonary arterial  branches on the right is limited due to significant atelectatic lung due to the large right pleural effusion. No pulmonary emboli identified. Mediastinum/Nodes: There is a large left pleural effusion filling nearly the entire left hemithorax resulting in significant atelectatic/collapsed lung on the left. Only minimal aerated lung remains on the left. The effusion results in shift of the heart mediastinum to the right. There is diffuse thickening of the right-sided pleura with pleural based masses remaining. The largest anterior pleural base mass on series 4, image 27 extends in the anterior chest wall, stable. No right-sided pleural effusion. No pericardial effusion. No right axillary adenopathy. Mildly enlarged nodes are seen in the base of the neck, particularly on the left. A 14 mm node is seen on image 5 in the base of the neck on the left. Left axillary and retropectoral adenopathy remains, similar in the interval. There is skin thickening and edema associated with the left chest wall including the breast. This does not appear to be confined to the breast. The chest wall is otherwise unremarkable. Adenopathy remains in the mediastinum. The esophagus and thyroid are unremarkable. Lungs/Pleura: There is only minimal aerated lung on the left. The right lung is free of infiltrate, nodule, or mass. No pneumothorax. The central airways are unremarkable. Upper Abdomen: Mild adenopathy remains in the upper abdomen. Musculoskeletal: No chest wall abnormality. No acute or significant osseous findings. Review of the MIP images confirms the above findings. IMPRESSION: 1. No pulmonary emboli identified. Evaluation of left-sided pulmonary arterial branches is limited due to near complete collapse of the left lung secondary to a large left pleural effusion. 2. There is a large left pleural effusion filling the left hemithorax resulting in significant collapse of the left lung. The pleura is thickened and there are multiple  pleural-based masses, unchanged. The large left effusion shifts the heart and mediastinum to the right. 3. Adenopathy in the left axilla, base of neck, left greater than right, mediastinum, and upper abdomen consistent with the patient's known lymphoma. Electronically Signed   By: Dorise Bullion III M.D   On: 03/09/2018 08:50   Dg Chest Left Decubitus  Result Date: 02/08/2018 CLINICAL DATA:  History left pleural effusion and prior tunneled  PleurX catheter placement. Possible enlarging loculated pleural effusion by chest x-ray. EXAM: CHEST - LEFT DECUBITUS COMPARISON:  Chest x-ray earlier today. FINDINGS: It is difficult to determine by a decubitus chest x-ray whether there is a significant component of pleural fluid. It does not appear that there is a large amount layering fluid, but there may be some layering component. Underlying atelectasis and/or consolidation of the lower lung is suspected. IMPRESSION: It is difficult to tell by decubitus chest x-ray whether there is a significant component of pleural fluid present. A CT of the chest would be needed to determine whether fluid is present and if so, its relationship to the existing tunneled PleurX drainage catheter. Electronically Signed   By: Aletta Edouard M.D.   On: 03/07/2018 20:41   Ir Guided Niel Hummer W Catheter Placement  Result Date: 02/21/2018 INDICATION: 62 year old male with a history of recurrent malignant left-sided pleural effusion. EXAM: IMAGE GUIDED TUNNELED PLEURAL CATHETER MEDICATIONS: 2.0 g Ancef. The antibiotics were administered within an appropriate time frame prior to the initiation of the procedure. ANESTHESIA/SEDATION: Fentanyl 100 mcg IV; Versed 2.0 mg IV Moderate Sedation Time:  13 minutes The patient was continuously monitored during the procedure by the interventional radiology nurse under my direct supervision. COMPLICATIONS: None PROCEDURE: The procedure, risks, benefits, and alternatives were explained to the patient and the  patient's family. Specific risks that were addressed included bleeding, infection, pneumothorax, need for further procedure, chance of delayed pneumothorax or hemorrhage, hemoptysis, cardiopulmonary collapse, death. Questions regarding the procedure were encouraged and answered. The patient understands and consents to the procedure. The left chest wall was prepped with Betadine in a sterile fashion, and a sterile drape was applied covering the operative field. A sterile gown and sterile gloves were used for the procedure. Local anesthesia was provided with 1% Lidocaine. Ultrasound image documentation was performed. After creating a small skin incision, a 19 gauge needle was advanced into the pleural cavity under ultrasound guidance. A guide wire was then advanced under fluoroscopy into the pleural space. Pleural access was dilated serially and a 16-French peel-away sheath placed. The skin and subcutaneous tissues were generously infiltrated with 1% lidocaine from the puncture site over the pleura along the intercostal margin anteriorly. A small stab incision was made with 11 blade scalpel at the insertion site of the catheter, and the catheter was back tunneled to the site at the pleural puncture. A tunneled CareFusion Pleurex catheter was placed. This was tunneled from the incision 5 cm anterior to the pleural access to the access site. The catheter was advanced through the peel-away sheath. The sheath was then removed. Final catheter positioning was confirmed with a fluoroscopic spot image. The access incision was closed with Dermabond, applied to the catheterization incision. Large volume thoracentesis was performed through the new catheter utilizing gravity drainage bag. The patient tolerated the procedure well and remained hemodynamically stable throughout. No complications were encountered and no significant blood loss was encountered. IMPRESSION: Status post left-sided tunneled pleural catheter. Signed, Dulcy Fanny. Earleen Newport, DO [facility] Electronically Signed   By: Corrie Mckusick D.O.   On: 02/21/2018 08:45   US Thoracentesis Asp Pleural Space W/img Guide  Result Date: 02/14/2018 INDICATION: Patient with history of mantle cell lymphoma with recurrent symptomatic malignant left pleural effusion. Request made for therapeutic left thoracentesis. EXAM: ULTRASOUND GUIDED THERAPEUTIC LEFT THORACENTESIS MEDICATIONS: None COMPLICATIONS: None immediate. PROCEDURE: An ultrasound guided thoracentesis was thoroughly discussed with the patient and questions answered. The benefits,  risks, alternatives and complications were also discussed. The patient understands and wishes to proceed with the procedure. Written consent was obtained. Ultrasound was performed to localize and mark an adequate pocket of fluid in the left chest. The area was then prepped and draped in the normal sterile fashion. 1% Lidocaine was used for local anesthesia. Under ultrasound guidance a 6 Fr Safe-T-Centesis catheter was introduced. Thoracentesis was performed. The catheter was removed and a dressing applied. FINDINGS: A total of approximately 2.4 liters of blood-tinged fluid was removed. IMPRESSION: Successful ultrasound guided therapeutic left thoracentesis yielding 2.4 liters of pleural fluid. Follow-up chest x-ray revealed no pneumothorax. Read by: Rowe Tate, PA-C Electronically Signed   By: Jacqulynn Cadet M.D.   On: 02/14/2018 13:24       IMPRESSION/PLAN: 1. Today, I talked to the patient about the findings and workup thus far. We discussed the natural history of stage IV mantle cell lymphoma and general treatment, highlighting the role of palliative radiotherapy in the management of recurrent malignant pleural effusion. We discussed the available radiation techniques, and focused on the details of logistics and delivery. Dr. Lisbeth Renshaw has reviewed the patient's chart and imaging and  recommends a 1-2 week course of low dose radiotherapy to the left lung/pleural effusion.  We reviewed the anticipated acute and late sequelae associated with radiation in this setting. The patient was encouraged to ask questions that were answered to his satisfaction.  At the completion of our conversation, the patient elects to proceed with palliative radiotherapy for management of his persistent malignant pleural effusion. He freely signed written consent and a copy of this document has been placed in his medical record.  He will have a CT SIM/planning appointment on Wednesday 03/12/18 in anticipation of beginning a 1-2 week course of daily radiotherapy treatments later this week.    Nicholos Johns, PA-C

## 2018-03-11 NOTE — Evaluation (Signed)
Physical Therapy Evaluation Patient Details Name: Kristopher Johnson MRN: [id number] DOB: [birth year] Today's Date: 03/11/2018   History of Present Illness  This 62 year old man was admitted with dizziness; SOB with exertion.  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  Clinical Impression  Patient presents with decreased independence with mobility due to weakness, limited activity tolerance with orthostatic hypotension, decreased balance and will benefit from skilled PT in the acute setting to allow return home with daughter assist and follow up HHPT.  Patient interested in strengthening HEP.  Will provide and progress mobility as tolerated as pt lightheaded and nauseated in standing for orthostatic testing today.  Orthostatic VS for the past 24 hrs (Last 3 readings):  BP- Lying Pulse- Lying BP- Sitting Pulse- Sitting BP- Standing at 0 minutes Pulse- Standing at 0 minutes  03/11/18 1143 104/68 114 117/74 126 (!) 89/53 130      Follow Up Recommendations Home health PT;Supervision/Assistance - 24 hour    Equipment Recommendations  Rolling walker with 5" wheels    Recommendations for Other Services       Precautions / Restrictions Precautions Precautions: Fall Precaution Comments: orthostatic, watch HR Restrictions Weight Bearing Restrictions: No      Mobility  Bed Mobility Overal bed mobility: Independent             General bed mobility comments: eob  Transfers Overall transfer level: Needs assistance Equipment used: None Transfers: Sit to/from Stand Sit to Stand: Supervision         General transfer comment: for safety  Ambulation/Gait             General Gait Details: deferred due to symptomatic orthostatic hypotension (but reports walks to bathroom on his own)  [employer] (Stroke Patients Only)       Balance Overall balance  assessment: Needs assistance   Sitting balance-Leahy Scale: Good Sitting balance - Comments: leaning over to fix milk/ensure shake      Standing balance-Leahy Scale: Fair Standing balance comment: min guard for safety.  Min A when ambulating                             Pertinent Vitals/Pain Pain Assessment: No/denies pain    Home Living Family/patient expects to be discharged to:: Private residence Living Arrangements: Children(daughter) Available Help at Discharge: Family Type of Home: House Home Access: Stairs to enter Entrance Stairs-Rails: Right Entrance Stairs-Number of Steps: 5 Home Layout: One level Home Equipment: None Additional Comments: lives with daughter/granddaughter: someone is always home with him    Prior Function Level of Independence: Needs assistance      ADL's / Homemaking Assistance Needed: does not do homemaking   Comments: has not been able to go out of house since April due to SOB/dizzy, unable to eat much     Hand Dominance        Extremity/Trunk Assessment   Upper Extremity Assessment Upper Extremity Assessment: Defer to OT evaluation    Lower Extremity Assessment Lower Extremity Assessment: Generalized weakness       Communication   Communication: No difficulties  Cognition Arousal/Alertness: Awake/alert Behavior During Therapy: Flat affect Overall Cognitive Status: Within Functional Limits for tasks assessed  General Comments General comments (skin integrity, edema, etc.): generally thin, frail and with obvious muscle atrophy    Exercises     Assessment/Plan    PT Assessment Patient needs continued PT services  PT Problem List Decreased strength;Decreased mobility;Decreased safety awareness;Decreased balance;Decreased knowledge of use of DME;Decreased activity tolerance;Cardiopulmonary status limiting activity       PT Treatment Interventions DME  instruction;Functional mobility training;Balance training;Patient/family education;Gait training;Therapeutic activities;Stair training;Therapeutic exercise    PT Goals (Current goals can be found in the Care Plan section)  Acute Rehab PT Goals Patient Stated Goal: Agreeable to PT for strengthening PT Goal Formulation: With patient Time For Goal Achievement: 03/25/18 Potential to Achieve Goals: Fair    Frequency Min 3X/week   Barriers to discharge        Co-evaluation               AM-PAC PT "6 Clicks" Daily Activity  Outcome Measure Difficulty turning over in bed (including adjusting bedclothes, sheets and blankets)?: None Difficulty moving from lying on back to sitting on the side of the bed? : None Difficulty sitting down on and standing up from a chair with arms (e.g., wheelchair, bedside commode, etc,.)?: A Little Help needed moving to and from a bed to chair (including a wheelchair)?: A Little Help needed walking in hospital room?: A Little Help needed climbing 3-5 steps with a railing? : A Lot 6 Click Score: 19    End of Session   Activity Tolerance: Treatment limited secondary to medical complications (Comment)(orthostatic hypotenstion) Patient left: in bed;with call bell/phone within reach   PT Visit Diagnosis: Other abnormalities of gait and mobility (R26.89);Muscle weakness (generalized) (M62.81)    Time: 1050-1110 PT Time Calculation (min) (ACUTE ONLY): 20 min   Charges:   PT Evaluation $PT Eval Moderate Complexity: 1 Mod     PT G CodesMagda Kiel, [place] [phone] 03/11/2018   Reginia Naas 03/11/2018, 11:54 AM

## 2018-03-11 NOTE — Plan of Care (Signed)
  Problem: Education: Goal: Knowledge of General Education information will improve Outcome: Progressing   Problem: Health Behavior/Discharge Planning: Goal: Ability to manage health-related needs will improve Outcome: Progressing   Problem: Nutrition: Goal: Adequate nutrition will be maintained Outcome: Progressing   Problem: Elimination: Goal: Will not experience complications related to bowel motility Outcome: Progressing Goal: Will not experience complications related to urinary retention Outcome: Progressing   Problem: Skin Integrity: Goal: Risk for impaired skin integrity will decrease Outcome: Progressing

## 2018-03-12 ENCOUNTER — Ambulatory Visit
Admit: 2018-03-12 | Discharge: 2018-03-12 | Disposition: A | Discharge status: Home / Self Care | Payer: Medicare Other | Attending: Radiation Oncology | Admitting: Radiation Oncology

## 2018-03-12 DIAGNOSIS — C8318 Mantle cell lymphoma, lymph nodes of multiple sites: Secondary | ICD-10-CM

## 2018-03-12 DIAGNOSIS — Z95828 Presence of other vascular implants and grafts: Secondary | ICD-10-CM

## 2018-03-12 LAB — BASIC METABOLIC PANEL
ANION GAP: 11 (ref 5–15)
BUN: 21 mg/dL — ABNORMAL HIGH (ref 6–20)
CHLORIDE: 97 mmol/L — AB (ref 101–111)
CO2: 28 mmol/L (ref 22–32)
CREATININE: 0.89 mg/dL (ref 0.61–1.24)
Calcium: 9.1 mg/dL (ref 8.9–10.3)
GFR calc non Af Amer: >60 mL/min (ref >60)
Glucose, Bld: 166 mg/dL — ABNORMAL HIGH (ref 65–99)
POTASSIUM: 4.9 mmol/L (ref 3.5–5.1)
SODIUM: 136 mmol/L (ref 135–145)

## 2018-03-12 LAB — CBC WITH DIFFERENTIAL/PLATELET
BASOS ABS: 0 10*3/uL (ref 0.0–0.1)
BASOS PCT: 0 %
EOS ABS: 0 10*3/uL (ref 0.0–0.7)
Eosinophils Relative: 0 %
HEMATOCRIT: 38.7 % — AB (ref 39.0–52.0)
HEMOGLOBIN: 12.4 g/dL — AB (ref 13.0–17.0)
Lymphocytes Relative: 9 %
Lymphs Abs: 1.2 10*3/uL (ref 0.7–4.0)
MCH: 28.4 pg (ref 26.0–34.0)
MCHC: 32 g/dL (ref 30.0–36.0)
MCV: 88.8 fL (ref 78.0–100.0)
Monocytes Absolute: 0.9 10*3/uL (ref 0.1–1.0)
Monocytes Relative: 7 %
NEUTROS PCT: 84 %
Neutro Abs: 11.7 10*3/uL — ABNORMAL HIGH (ref 1.7–7.7)
Platelets: 192 10*3/uL (ref 150–400)
RBC: 4.36 MIL/uL (ref 4.22–5.81)
RDW: 15.6 % — ABNORMAL HIGH (ref 11.5–15.5)
WBC: 13.8 10*3/uL — AB (ref 4.0–10.5)

## 2018-03-12 MED ORDER — PROMETHAZINE HCL 25 MG/ML IJ SOLN
12.5000 mg | Freq: Once | INTRAMUSCULAR | Status: AC
  Administered 2018-03-12: 12.5 mg via INTRAVENOUS
  Filled 2018-03-12: qty 1

## 2018-03-12 NOTE — Progress Notes (Signed)
Patient requests bedside commode for home use if that can be arranged for d/c.

## 2018-03-12 NOTE — Progress Notes (Signed)
SATURATION QUALIFICATIONS: (This note is used to comply with regulatory documentation for home oxygen)  Patient Saturations on Room Air at Rest = 95%  Patient Saturations on [employer] while Ambulating = 92-94%   Margurite Auerbach, RN

## 2018-03-12 NOTE — Progress Notes (Addendum)
PROGRESS NOTE  Kristopher Johnson  JJH:[id number] DOB: 02/07/1956 DOA: 02/20/2018 PCP: Patient, No Pcp Per  Outpatient Specialists: Oncology, Irene Limbo Brief Narrative: Kristopher Johnson is a 62 y.o. 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 Normally has drainage by HH-RN every 48hrs but this seemed to be accumulating more rapidly.  Patient attributed all his new symptoms to his medication venetoclax which has been gradually increased by his oncologist Dr. Irene Limbo who later recommended patient stop the medication.  In the ED, chest x-ray showed large left-sided pleural effusion, and he was found to have orthostatic hypotension. Oncology was consulted, recommended discontinuing venetoclax, continuing ibrutinib. Radiation oncology consulted and are planning 2 weeks of palliative radiation. Pleur-x catheter is being drained daily. Palliative care consulted as well.   Assessment & Plan: Principal Problem:   Pleural effusion, left Active Problems:   Mantle cell lymphoma of lymph nodes of multiple regions [facility])   Legally blind   Port-A-Cath in place   Counseling regarding advance care planning and goals of care   Malignant pleural effusion   Palliative care encounter  Stage IVB mantle cell lymphoma, relapsed:  - Oncology, Dr. Irene Limbo has visited with the patient. Continues to recommend ibrutinib, rituxan q3 weeks. Holding venetoclax as this may have worsened dyspnea, though he is considering restart at lower dose if improving (LDH improved on this, indicating response).  - Palliative care team consulted. Option of hospice was broached but his goals were for continued treatment with active treatments. Recommended supportive care, norco for pain and confirmed DNR. - Rad Onc consulted: Planning  palliative radiation x2 weeks beginning 6/6 in hopes of reducing effusion.  Malignant pleural effusion on left: Recurrent.  - Will continue daily pleur-x catheter drainage with IV albumin if pt becomes symptomatically hypotensive (nursing care order placed).  Orthostatic hypotension: Due to malnutrition/decreased po intake, malignancy, recurrent effusion and drainage/fluid shifts.  - Continue to push po hydration, supplementation, and IV albumin prn. - Recheck orthostatic vital signs daily.  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  - Has help at home  Protein-calorie malnutrition:  - Supplements as ordered  DVT prophylaxis: Lovenox Code Status: DNR Family Communication: Daughter at bedside Disposition Plan: Home when stable.  Consultants:   Oncology  Radiation oncology  Procedures:   Daily pleur-x catheter drainage  Radiation SIM 6/5  Antimicrobials:  None   Subjective: Dyspnea is still severe. No chest pain. Feels weak and very dizzy when he gets up. Feels like he needs to have effusion drained this morning.  Objective: Vitals:   03/11/18 1402 03/11/18 2046 03/12/18 0406 03/12/18 1331  BP: 125/68 128/73 (!) 119/58 111/69  Pulse: (!) 114 (!) 105 95 98  Resp: 18 18 18 12   Temp: 97.6 F (36.4 C) (!) 97.5 F (36.4 C) 98.4 F (36.9 C) 97.9 F (36.6 C)  TempSrc: Oral Oral  Oral  SpO2: 90% 92% 96% 91%  Weight:      Height:        Intake/Output Summary (Last 24 hours) at 03/12/2018 1654 Last data filed at 03/12/2018 1638 Gross per 24 hour  Intake 470 ml  Output 1300 ml  Net -830 ml   Filed Weights   02/16/2018 2055  Weight: 82.7 kg (182 lb 5.1 oz)  Gen: Pleasant, chronically ill-appearing male in no distress Pulm: Mildly labored with exertion, decreased on left. Dull to percussion on left. CV: Regular rate and rhythm. No murmur, rub, or gallop. No JVD, trace pedal edema. GI: Abdomen soft,  non-tender, non-distended, with normoactive bowel sounds. No organomegaly or masses felt. Ext: Warm, no deformities Skin: No rashes, lesions or ulcers. Right chest port site c/d/i. Neuro: Alert and oriented. Light perception visual acuity. No focal neurological deficits. Psych: Judgement and insight appear normal. Mood & affect appropriate.   Data Reviewed: I have personally reviewed following labs and imaging studies  CBC: Recent Labs  Lab 03/06/18 0505 03/07/18 0426 03/08/18 0437 03/09/18 0437 03/12/18 0344  WBC 10.8* 7.2 7.9 7.7 13.8*  NEUTROABS 8.2* 5.2 6.0 6.9 11.7*  HGB 13.4 13.0 13.2 12.5* 12.4*  HCT 42.6 41.4 42.2 40.4 38.7*  MCV 92.2 91.4 92.7 92.2 88.8  PLT 220 187 191 172 644   Basic Metabolic Panel: Recent Labs  Lab 03/06/18 0505 03/07/18 0426 03/08/18 0437 03/09/18 0437 03/12/18 0344  NA 139 139 138 137 136  K 4.0 4.3 3.8 4.1 4.9  CL 102 102 101 99* 97*  CO2 23 25 25 26 28   GLUCOSE 179* 130* 145* 133* 166*  BUN 13 14 16 15  21*  CREATININE 0.87 0.87 0.88 0.84 0.89  CALCIUM 9.1 9.0 8.9 8.9 9.1  PHOS 2.6 3.0 2.5 2.8  --    GFR: Estimated Creatinine Clearance: 83.9 mL/min (by C-G formula based on SCr of 0.89 mg/dL). Liver Function Tests: Recent Labs  Lab 03/06/18 0505 03/07/18 0426 03/08/18 0437 03/09/18 0437  ALBUMIN 3.1* 2.8* 2.8* 3.2*   No results for input(s): LIPASE, AMYLASE in the last 168 hours. No results for input(s): AMMONIA in the last 168 hours. Coagulation Profile: No results for input(s): INR, PROTIME in the last 168 hours. Cardiac Enzymes: No results for input(s): CKTOTAL, CKMB, CKMBINDEX, TROPONINI in the last 168 hours. BNP (last 3 results) No results for input(s): PROBNP in the last 8760 hours. HbA1C: No results for input(s): HGBA1C in the last 72 hours. CBG: No results for input(s): GLUCAP in the last 168 hours. Lipid Profile: No results for input(s): CHOL, HDL, LDLCALC, TRIG, CHOLHDL, LDLDIRECT in the last 72  hours. Thyroid Function Tests: No results for input(s): TSH, T4TOTAL, FREET4, T3FREE, THYROIDAB in the last 72 hours. Anemia Panel: No results for input(s): VITAMINB12, FOLATE, FERRITIN, TIBC, IRON, RETICCTPCT in the last 72 hours. Urine analysis:    Component Value Date/Time   COLORURINE YELLOW 02/05/2018 1620   APPEARANCEUR CLEAR 02/20/2018 1620   LABSPEC 1.026 03/06/2018 1620   PHURINE 6.0 02/13/2018 1620   GLUCOSEU NEGATIVE 02/24/2018 1620   HGBUR SMALL (A) 02/17/2018 1620   BILIRUBINUR NEGATIVE 03/05/2018 1620   KETONESUR 5 (A) 03/06/2018 1620   PROTEINUR 30 (A) 02/09/2018 1620   NITRITE NEGATIVE 02/19/2018 1620   LEUKOCYTESUR NEGATIVE 02/14/2018 1620   No results found for this or any previous visit (from the past 240 hour(s)).    Radiology Studies: No results found.  Scheduled Meds: . chlorhexidine  15 mL Mouth Rinse BID  . enoxaparin (LOVENOX) injection  40 mg Subcutaneous Q24H  . feeding supplement (ENSURE ENLIVE)  237 mL Oral TID BM  . Ibrutinib  560 mg Oral Daily  . mouth rinse  15 mL Mouth Rinse q12n4p  . multivitamin with minerals  1 tablet Oral Daily  . polyethylene glycol  17 g Oral Daily  . senna-docusate  1 tablet Oral BID  Continuous Infusions:   LOS: 5 days   Time spent: 25 minutes.  Patrecia Pour, MD [facility] [email or web address] Password [facility] 03/12/2018, 4:54 PM

## 2018-03-13 ENCOUNTER — Ambulatory Visit
Admit: 2018-03-13 | Discharge: 2018-03-13 | Disposition: A | Discharge status: Home / Self Care | Payer: Medicare Other | Attending: Radiation Oncology | Admitting: Radiation Oncology

## 2018-03-13 MED ORDER — ALBUMIN HUMAN 25 % IV SOLN: 25.0000 g | Freq: Every day | INTRAVENOUS | 0 refills | Status: DC | PRN

## 2018-03-13 MED ORDER — MOMETASONE FUROATE 0.1 % EX CREA
TOPICAL_CREAM | Freq: Every day | CUTANEOUS | Status: DC
  Administered 2018-03-14 – 2018-03-28 (×11): via TOPICAL
  Filled 2018-03-13 (×2): qty 15

## 2018-03-13 MED ORDER — MORPHINE SULFATE (PF) 2 MG/ML IV SOLN
2.0000 mg | INTRAVENOUS | Status: DC | PRN
  Administered 2018-03-13 – 2018-03-14 (×8): 2 mg via INTRAVENOUS
  Filled 2018-03-13: qty 1
  Filled 2018-03-13: qty 2
  Filled 2018-03-13 (×7): qty 1

## 2018-03-13 MED ORDER — SODIUM CHLORIDE 0.9 % IV SOLN
INTRAVENOUS | Status: DC
  Administered 2018-03-13 – 2018-03-14 (×3): via INTRAVENOUS

## 2018-03-13 MED ORDER — HYDROCODONE-ACETAMINOPHEN 5-325 MG PO TABS: 1.0000 | ORAL_TABLET | Freq: Four times a day (QID) | ORAL | 0 refills | Status: DC | PRN

## 2018-03-13 NOTE — Care Management Note (Signed)
Case Management Note  Patient Details  Name: WACO FOERSTER MRN: [id number] Date of Birth: 1956/04/17  Subjective/Objective:Active w/[facility] HHRN-pleurx drainage cath(they can coninue to instruct patient on pleurx drain w/ increased need for drainage QD. If IV albumin needed for [facility] can manage w/[facility] iv albumin orders. PT-recc HHPT. Noted patient wants a 3n1. Can arrange all [place] w/HHC/face to face order.                      Action/Plan:d/c home w/HHC/dme   Expected Discharge Date:  (unknown)               Expected Discharge Plan:  [place]  In-House Referral:     Discharge planning Services  CM Consult  Post Acute Care Choice:  Home Health(Active w/[facility] HHRN-pleurx cath drain) Choice offered to:     DME Arranged:    DME Agency:     HH Arranged:  RN [place] Agency:  [place]  Status of Service:  In process, will continue to follow  If discussed at Long Length of Stay Meetings, dates discussed:    Additional Comments:  Dessa Phi, RN 03/13/2018, 8:05 AM

## 2018-03-13 NOTE — Care Management Note (Signed)
Case Management Note  Patient Details  Name: Kristopher Johnson MRN: [id number] Date of Birth: [birth year]  Subjective/Objective: [facility] already following for HHRN-pleurx cath drainage, iv albumin;HHPT;home 3n1(to deliver to rm prior d/c)                   Action/Plan:d/c home w/HHC/dme   Expected Discharge Date:  (unknown)               Expected Discharge Plan:  [place]  In-House Referral:     Discharge planning Services  CM Consult  Post Acute Care Choice:  Home Health(Active w/[facility] HHRN-pleurx cath drain) Choice offered to:  Patient  DME Arranged:  3-N-1 DME Agency:     HH Arranged:  RN, PT [place] Agency:  [place]  Status of Service:  Completed, signed off  If discussed at [place] of Stay Meetings, dates discussed:    Additional Comments:  Dessa Phi, RN 03/13/2018, 10:41 AM

## 2018-03-13 NOTE — Progress Notes (Signed)
PT Cancellation Note  Patient Details Name: Kristopher Johnson MRN: [id number] DOB: 04-19-1956   Cancelled Treatment:    Reason Eval/Treat Not Completed: Fatigue/lethargy limiting ability to participate;Pain limiting ability to participate(pt declined PT, stated he's fatigued from doing OT earlier and is having pain at Pleurex site. Will follow. )   Philomena Doheny 03/13/2018, 11:58 AM [phone]

## 2018-03-13 NOTE — Progress Notes (Signed)
PROGRESS NOTE  Kristopher Johnson  IRS:[id number] DOB: 07/17/1956 DOA: 02/07/2018 PCP: Patient, No Pcp Per  Outpatient Specialists: Oncology, Irene Limbo Brief Narrative: Kristopher Johnson is a 62 y.o. 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 Normally has drainage by HH-RN every 48hrs but this seemed to be accumulating more rapidly.  Patient attributed all his new symptoms to his medication venetoclax which has been gradually increased by his oncologist Dr. Irene Limbo who later recommended patient stop the medication.  In the ED, chest x-ray showed large left-sided pleural effusion, and he was found to have orthostatic hypotension. Oncology was consulted, recommended discontinuing venetoclax, continuing ibrutinib. Radiation oncology consulted and are planning 2 weeks of palliative radiation. Pleur-x catheter is being drained daily and will need to be continued at home, family has been instructed. Palliative care consulted as well, though pt wishes for continued active treatment, not hospice care.   Assessment & Plan: Principal Problem:   Pleural effusion, left Active Problems:   Mantle cell lymphoma of lymph nodes of multiple regions [facility])   Legally blind   Port-A-Cath in place   Counseling regarding advance care planning and goals of care   Malignant pleural effusion   Palliative care encounter  Stage IVB mantle cell lymphoma, relapsed:  - Oncology, Dr. Irene Limbo has visited with the patient. Continues to recommend ibrutinib, rituxan q3 weeks. Holding venetoclax as this may have worsened dyspnea, though he is considering restart at lower dose if improving (LDH improved on this, indicating response).  - Palliative care team consulted. Option of hospice was broached but his goals were for  continued treatment with active treatments. Recommended supportive care, norco for pain and confirmed DNR. - Rad Onc consulted: Planning palliative radiation x2 weeks beginning 6/6 in hopes of reducing effusion.  Malignant pleural effusion on left: Recurrent.  - Will continue daily pleur-x catheter drainage with IV albumin if pt becomes symptomatically hypotensive (nursing care order placed). - In preparation of discharge, albumin has been ordered and I've called for prior authorization. Since this is not on formulary, a coverage determination is actually what is required, and I've requested the form be faxed to the [facility] [address]. Reference ID# for the request is [id number]. Further details in physical chart.  Dehydration: With 1L output from pleur-x daily in addition to other losses and his decreased po intake, I suspect he is not keeping up. BUN has been slowly trending upward.  - Start IV fluids. Pt hopeful for discharge today but understands that he needs to be able to take in adequate po to safely be discharged.   Leukocytosis: No fever or change in symptoms or infiltrate on CTA chest. ?Dehydration - Monitor off abx. Recheck in AM.   Orthostatic hypotension: Due to malnutrition/decreased po intake, malignancy, recurrent effusion and drainage/fluid shifts.  - Continue to push po hydration, supplementation, and IV albumin prn. 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  - Has help at home  Severe protein-calorie malnutrition:  - Supplements as ordered - Multivitamin with minerals  DVT prophylaxis: Lovenox Code Status: DNR Family Communication: Daughter at bedside this PM Disposition Plan: Home when stable.  Consultants:   Oncology  Radiation oncology  Procedures:   Daily pleur-x catheter drainage  Radiation  SIM 6/5  Antimicrobials:  None   Subjective: Wants to go home. Dyspnea  is stable, controlled at rest, no need for oxygen. No chest pain or palpitations. Gets dizzy when he stands, but that has improved. Initially insisted on discharge but when his daughter, who is his primary caretaker, arrived, she voiced concerns over safety of discharge. After further discussion we have coaxed the patient into remaining in the hospital for now.  Objective: Vitals:   03/12/18 2020 03/13/18 0720 03/13/18 0946 03/13/18 1229  BP: 113/64 111/72  106/60  Pulse: (!) 105 (!) 121  (!) 108  Resp: 18 18 20 17   Temp: 97.9 F (36.6 C) 97.7 F (36.5 C)  98.7 F (37.1 C)  TempSrc: Oral Oral  Oral  SpO2: 91% 98%  91%  Weight:      Height:        Intake/Output Summary (Last 24 hours) at 03/13/2018 1410 Last data filed at 03/12/2018 1638 Gross per 24 hour  Intake 10 ml  Output -  Net 10 ml   Filed Weights   02/27/2018 2055  Weight: 82.7 kg (182 lb 5.1 oz)    Gen: Pleasant, chronically ill-appearing male in no distress Pulm: Not labored at rest. Dullness to percussion and decreased breath sounds on auscultation on the left. No wheezes or crackles.. CV: Regular rate and rhythm. No murmur, rub, or gallop. No JVD, trace pedal edema. GI: Abdomen soft, non-tender, non-distended, with normoactive bowel sounds. No organomegaly or masses felt. Ext: Warm, no deformities. Decreased muscle bulk. Skin: No rashes, lesions or ulcers. Right chest port site c/d/i. Diffuse striae indicating rapid weight loss.  Neuro: Alert and oriented. Light perception visual acuity. No focal neurological deficits. Psych: Judgement and insight appear normal. Mood & affect appropriate.   Data Reviewed: I have personally reviewed following labs and imaging studies  CBC: Recent Labs  Lab 03/07/18 0426 03/08/18 0437 03/09/18 0437 03/12/18 0344  WBC 7.2 7.9 7.7 13.8*  NEUTROABS 5.2 6.0 6.9 11.7*  HGB 13.0 13.2 12.5* 12.4*  HCT 41.4 42.2 40.4 38.7*  MCV 91.4 92.7 92.2 88.8  PLT 187 191 172 102   Basic  Metabolic Panel: Recent Labs  Lab 03/07/18 0426 03/08/18 0437 03/09/18 0437 03/12/18 0344  NA 139 138 137 136  K 4.3 3.8 4.1 4.9  CL 102 101 99* 97*  CO2 25 25 26 28   GLUCOSE 130* 145* 133* 166*  BUN 14 16 15  21*  CREATININE 0.87 0.88 0.84 0.89  CALCIUM 9.0 8.9 8.9 9.1  PHOS 3.0 2.5 2.8  --    GFR: Estimated Creatinine Clearance: 83.9 mL/min (by C-G formula based on SCr of 0.89 mg/dL). Liver Function Tests: Recent Labs  Lab 03/07/18 0426 03/08/18 0437 03/09/18 0437  ALBUMIN 2.8* 2.8* 3.2*   No results for input(s): LIPASE, AMYLASE in the last 168 hours. No results for input(s): AMMONIA in the last 168 hours. Coagulation Profile: No results for input(s): INR, PROTIME in the last 168 hours. Cardiac Enzymes: No results for input(s): CKTOTAL, CKMB, CKMBINDEX, TROPONINI in the last 168 hours. BNP (last 3 results) No results for input(s): PROBNP in the last 8760 hours. HbA1C: No results for input(s): HGBA1C in the last 72 hours. CBG: No results for input(s): GLUCAP in the last 168 hours. Lipid Profile: No results for input(s): CHOL, HDL, LDLCALC, TRIG, CHOLHDL, LDLDIRECT in the last 72 hours. Thyroid Function Tests: No results for input(s): TSH, T4TOTAL, FREET4, T3FREE, THYROIDAB in the last 72 hours. Anemia Panel: No results  for input(s): VITAMINB12, FOLATE, FERRITIN, TIBC, IRON, RETICCTPCT in the last 72 hours. Urine analysis:    Component Value Date/Time   COLORURINE YELLOW 02/18/2018 1620   APPEARANCEUR CLEAR 02/17/2018 1620   LABSPEC 1.026 03/03/2018 1620   PHURINE 6.0 02/09/2018 1620   GLUCOSEU NEGATIVE 02/13/2018 1620   HGBUR SMALL (A) 02/23/2018 1620   BILIRUBINUR NEGATIVE 03/03/2018 1620   KETONESUR 5 (A) 02/24/2018 1620   PROTEINUR 30 (A) 02/24/2018 1620   NITRITE NEGATIVE 02/06/2018 1620   LEUKOCYTESUR NEGATIVE 02/06/2018 1620   No results found for this or any previous visit (from the past 240 hour(s)).    Radiology Studies: No results  found.  Scheduled Meds: . chlorhexidine  15 mL Mouth Rinse BID  . enoxaparin (LOVENOX) injection  40 mg Subcutaneous Q24H  . feeding supplement (ENSURE ENLIVE)  237 mL Oral TID BM  . Ibrutinib  560 mg Oral Daily  . mouth rinse  15 mL Mouth Rinse q12n4p  . multivitamin with minerals  1 tablet Oral Daily  . polyethylene glycol  17 g Oral Daily  . senna-docusate  1 tablet Oral BID   Continuous Infusions:   LOS: 6 days   Time spent: 25 minutes.  Patrecia Pour, MD [facility] [email or web address] Password [email or web address] 03/13/2018, 2:10 PM

## 2018-03-13 NOTE — Progress Notes (Signed)
Called by RN regarding pt complaint of chest pain. Noticed gradual onset over the few hours since he returned from his first radiation therapy of left chest pain which is completely different from previous pain, not associated with shortness of breath, worse when the skin is touched. Area appears edematous without significant erythema.   With the superficial nature of the pain, and timing, this seems most consistent with radiation dermatitis. 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  - Will order IV pain medication to be used if norco ineffective - Apply medium potency topical steroid to the area.  Vance Gather, MD 03/13/2018 5:55 PM

## 2018-03-13 NOTE — Progress Notes (Signed)
Per [facility] in team liason-Pam has put a note in the shadow chart for prior auth needed for iv albumin-MD has been paged,also put a note in secure chat,& nsg updated.

## 2018-03-13 NOTE — Discharge Summary (Addendum)
Physician Discharge Summary  Kristopher Johnson:[id number] DOB: 24-Oct-1955 DOA: 03/03/2018  PCP: Patient, No Pcp Per  Admit date: 02/15/2018 Discharge date: 03/13/2018  Admitted From: Home Disposition: Home   Recommendations for Outpatient Follow-up:  1. Follow up with radiation oncology, daily radiation treatments to continue x2 weeks.  2. Follow up with Dr. Irene Limbo, oncology 3. Continue home health RN, daily pleur-x catheter drainage with administration of IV albumin through port if pt is orthostatic.  Home Health: PT, RN Equipment/Devices: Continue pleur-x catheter Discharge Condition: Stable with guarded prognosis CODE STATUS: DNR Diet recommendation: As tolerated  Brief/Interim Summary: Kristopher Johnson is a 62 y.o. 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 Normally has drainage by HH-RN every 48hrs but this seemed to be accumulating more rapidly. Patient attributed all his new symptoms to his medication venetoclax which has been gradually increased by his oncologist Dr. Irene Limbo who later recommended patient stop the medication. In the ED, chest x-ray showed large left-sided pleural effusion, and he was found to have orthostatic hypotension. Oncology was consulted, recommended discontinuing venetoclax, continuing ibrutinib. Radiation oncology consulted and are planning 2 weeks of palliative radiation. Pleur-x catheter is being drained daily. Palliative care consulted as well though the patient wishes to continue active therapy, not hospice care.He is now requiring daily pleur-x catheter drainage which the patient has been instructed on and is stable for discharge with no respiratory distress or hypoxia.   Discharge Diagnoses:  Principal Problem:   Pleural effusion,  left Active Problems:   Mantle cell lymphoma of lymph nodes of multiple regions [facility])   Legally blind   Port-A-Cath in place   Counseling regarding advance care planning and goals of care   Malignant pleural effusion   Palliative care encounter  Stage IVB mantle cell lymphoma, relapsed:  - Oncology, Dr. Irene Limbo has visited with the patient. Continues to recommend ibrutinib, rituxan q3 weeks. Holding venetoclax as this may have worsened dyspnea, though he is considering restart at lower dose if improving (LDH improved on this, indicating response).  - Palliative care team consulted. Option of hospice was broached but his goals were for continued treatment with active treatments. Recommended supportive care, norco for pain and confirmed DNR. - Rad Onc consulted: Planning palliative radiation x2 weeks beginning 6/6 in hopes of reducing effusion.  Malignant pleural effusion on left: Recurrent.  - Will continue daily pleur-x catheter drainage with IV albumin if pt becomes symptomatically hypotensive.  Leukocytosis: No fever or change in symptoms or infiltrate on CTA chest.  - Monitor off abx. Return precautions addressed prior to discharge.  Orthostatic hypotension: Due to malnutrition/decreased po intake, malignancy, recurrent effusion and drainage/fluid shifts.  - Continue to push po hydration, supplementation, and IV albumin prn. BUN rising modestly so discussed option of IV fluids w/patient though he declines, will push po intake. - Recheck vital signs by home health RN and administer albumin if hypotensive.  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  - Has 24 hr help at home  Severe protein-calorie malnutrition:  - Supplements recommended. Push po.  Discharge Instructions Discharge Instructions    Diet general   Complete by:  As directed    Discharge instructions   Complete by:  As directed    - Continue draining the  pleur-x catheter daily. The home health nurse should check your blood pressure and administer albumin if it is too low.  - Continue getting radiation M-F as scheduled - It is important that you drink enough fluid to replace the losses through the catheter. Gatorade is a good option and ensure would be better. - For pain STOP taking tramadol and START taking norco as needed as directed. - Follow up with radiation oncology and Dr. Irene Limbo, or seek medical attention sooner if your symptoms worsen.  - You will also receive a 3 in 1 and home health PT which will be arranged prior to discharge.   Increase activity slowly   Complete by:  As directed      Allergies as of 03/13/2018   No Known Allergies     Medication List    STOP taking these medications   dexamethasone 4 MG tablet Commonly known as:  DECADRON   traMADol 50 MG tablet Commonly known as:  ULTRAM   Venetoclax 10 & 50 & 100 MG Tbpk     TAKE these medications   albumin human 25 % bottle Inject 100 mLs (25 g total) into the vein daily as needed (if systolic BP <119JYNW or diastolic BP < 29FAOZ).   allopurinol 300 MG tablet Commonly known as:  ZYLOPRIM Take 1 tablet (300 mg total) by mouth daily.   docusate sodium 100 MG capsule Commonly known as:  COLACE Take 100 mg by mouth daily as needed for mild constipation.   HYDROcodone-acetaminophen 5-325 MG tablet Commonly known as:  NORCO/VICODIN Take 1-2 tablets by mouth every 6 (six) hours as needed for up to 5 days for moderate pain or severe pain.   Ibrutinib 560 MG Tabs Take 560 mg by mouth daily. Take with a full glass of water at approximately the same time daily, maintain hydration.   pantoprazole 40 MG tablet Commonly known as:  PROTONIX Take 1 tablet (40 mg total) by mouth daily.   prochlorperazine 5 MG tablet Commonly known as:  COMPAZINE Take 2 tablets (10 mg total) by mouth every 6 (six) hours as needed for nausea or vomiting.   senna-docusate 8.6-50 MG  tablet Commonly known as:  SENNA S Take 2 tablets by mouth at bedtime.            Durable Medical Equipment  (From admission, onward)        Start     Ordered   03/13/18 1029  For home use only DME 3 n 1  Once     03/13/18 1030     Follow-up Information    Brunetta Genera, MD Follow up.   Specialties:  Hematology, Oncology Contact information: [place] [address] [phone]        Kyung Rudd, MD Follow up.   Specialty:  Radiation Oncology Contact information: [address] [place] [address] [phone]          No Known Allergies  Consultations:  Oncology  Radiation oncology  Procedures/Studies: Dg Chest 1 View  Result Date: 02/14/2018 CLINICAL DATA:  Status post LEFT thoracentesis today. EXAM: CHEST  1 VIEW COMPARISON:  02/06/2018 and prior studies FINDINGS: Decreased size of loculated LEFT pleural effusion noted. There is no evidence of pneumothorax. LEFT LOWER lung atelectasis/opacity again noted. A RIGHT Port-A-Cath with tip overlying the LOWER SVC again noted. The RIGHT lung is clear. IMPRESSION: Decreased loculated LEFT pleural effusion. No evidence of pneumothorax. Electronically Signed   By: Cleatis Polka.D.  On: 02/14/2018 12:55   Dg Chest 2 View  Result Date: 03/05/2018 CLINICAL DATA:  History of lung carcinoma with shortness of Breath EXAM: CHEST - 2 VIEW COMPARISON:  02/20/2018 PleurX, 02/14/2018 FINDINGS: Cardiac shadow is obscured by a significantly enlarged left-sided pleural effusion. A left PleurX catheter is noted in place. Minimal left lung aeration is noted. Right chest wall port is seen in satisfactory position. Right lung is clear. No bony abnormality is noted. IMPRESSION: Enlarging left-sided pleural effusion. Electronically Signed   By: Inez Catalina M.D.   On: 02/20/2018 17:00   Ct Chest Wo Contrast  Result Date: 03/05/2018 CLINICAL DATA:  Shortness of breath, cough. EXAM: CT CHEST WITHOUT  CONTRAST TECHNIQUE: Multidetector CT imaging of the chest was performed following the standard protocol without IV contrast. COMPARISON:  PET-CT 02/05/2018 and previous FINDINGS: Cardiovascular: Right IJ port catheter to the distal SVC. Heart size normal. No pericardial effusion. Mediastinum/Nodes: Left supraclavicular, anterior mediastinal, right paratracheal, and subcarinal adenopathy as before. There is mild rightward mediastinal shift secondary to left pleural disease. Anterior left pleural mass is probably associated with mediastinal invasion. Lungs/Pleura: Progressive, near complete opacification of the left lung. Large left pleural effusion with good position of tunneled drain catheter. Extensive pleural thickening and masses. Right lung clear. Upper Abdomen: Stable epigastric adenopathy. Stable probable cyst in left hepatic lobe. No acute findings. Musculoskeletal: Spondylitic changes in the visualized lower cervical spine. No fracture or worrisome bone lesion. IMPRESSION: 1. Progressive opacification of left hemithorax secondary to nodular pleural disease and large effusion. 2. The tunneled left pleural drain catheter appears appropriately positioned, suggesting the patient may benefit from more aggressive thoracentesis. 3. Persistent left supraclavicular, mediastinal, and epigastric adenopathy. Electronically Signed   By: Lucrezia Europe M.D.   On: 03/05/2018 14:58   Ct Angio Chest Pe W Or Wo Contrast  Result Date: 03/09/2018 CLINICAL DATA:  62 year old male with stage IV lymphoma. Recurrent malignant effusion with catheter placed Feb 20, 2018. Dizziness and shortness of breath. EXAM: CT ANGIOGRAPHY CHEST WITH CONTRAST TECHNIQUE: Multidetector CT imaging of the chest was performed using the standard protocol during bolus administration of intravenous contrast. Multiplanar CT image reconstructions and MIPs were obtained to evaluate the vascular anatomy. CONTRAST:  126mL ISOVUE-370 IOPAMIDOL (ISOVUE-370)  INJECTION 76% COMPARISON:  Chest CT Mar 05, 2018 FINDINGS: Cardiovascular: The heart mediastinum are shifted to the right due to a large left pleural effusion which will be described below. The thoracic aorta is normal in caliber with no atherosclerosis, aneurysm, or dissection. The heart size is normal and stable. No obvious coronary artery calcifications. Evaluation of pulmonary arterial branches on the right is limited due to significant atelectatic lung due to the large right pleural effusion. No pulmonary emboli identified. Mediastinum/Nodes: There is a large left pleural effusion filling nearly the entire left hemithorax resulting in significant atelectatic/collapsed lung on the left. Only minimal aerated lung remains on the left. The effusion results in shift of the heart mediastinum to the right. There is diffuse thickening of the right-sided pleura with pleural based masses remaining. The largest anterior pleural base mass on series 4, image 27 extends in the anterior chest wall, stable. No right-sided pleural effusion. No pericardial effusion. No right axillary adenopathy. Mildly enlarged nodes are seen in the base of the neck, particularly on the left. A 14 mm node is seen on image 5 in the base of the neck on the left. Left axillary and retropectoral adenopathy remains, similar in the interval. There is skin thickening  and edema associated with the left chest wall including the breast. This does not appear to be confined to the breast. The chest wall is otherwise unremarkable. Adenopathy remains in the mediastinum. The esophagus and thyroid are unremarkable. Lungs/Pleura: There is only minimal aerated lung on the left. The right lung is free of infiltrate, nodule, or mass. No pneumothorax. The central airways are unremarkable. Upper Abdomen: Mild adenopathy remains in the upper abdomen. Musculoskeletal: No chest wall abnormality. No acute or significant osseous findings. Review of the MIP images confirms  the above findings. IMPRESSION: 1. No pulmonary emboli identified. Evaluation of left-sided pulmonary arterial branches is limited due to near complete collapse of the left lung secondary to a large left pleural effusion. 2. There is a large left pleural effusion filling the left hemithorax resulting in significant collapse of the left lung. The pleura is thickened and there are multiple pleural-based masses, unchanged. The large left effusion shifts the heart and mediastinum to the right. 3. Adenopathy in the left axilla, base of neck, left greater than right, mediastinum, and upper abdomen consistent with the patient's known lymphoma. Electronically Signed   By: Dorise Bullion III M.D   On: 03/09/2018 08:50   Dg Chest Left Decubitus  Result Date: 02/06/2018 CLINICAL DATA:  History left pleural effusion and prior tunneled PleurX catheter placement. Possible enlarging loculated pleural effusion by chest x-ray. EXAM: CHEST - LEFT DECUBITUS COMPARISON:  Chest x-ray earlier today. FINDINGS: It is difficult to determine by a decubitus chest x-ray whether there is a significant component of pleural fluid. It does not appear that there is a large amount layering fluid, but there may be some layering component. Underlying atelectasis and/or consolidation of the lower lung is suspected. IMPRESSION: It is difficult to tell by decubitus chest x-ray whether there is a significant component of pleural fluid present. A CT of the chest would be needed to determine whether fluid is present and if so, its relationship to the existing tunneled PleurX drainage catheter. Electronically Signed   By: Aletta Edouard M.D.   On: 02/27/2018 20:41   Ir Guided Niel Hummer W Catheter Placement  Result Date: 02/21/2018 INDICATION: 62 year old male with a history of recurrent malignant left-sided pleural effusion. EXAM: IMAGE GUIDED TUNNELED PLEURAL CATHETER MEDICATIONS: 2.0 g Ancef. The antibiotics were administered within an appropriate  time frame prior to the initiation of the procedure. ANESTHESIA/SEDATION: Fentanyl 100 mcg IV; Versed 2.0 mg IV Moderate Sedation Time:  13 minutes The patient was continuously monitored during the procedure by the interventional radiology nurse under my direct supervision. COMPLICATIONS: None PROCEDURE: The procedure, risks, benefits, and alternatives were explained to the patient and the patient's family. Specific risks that were addressed included bleeding, infection, pneumothorax, need for further procedure, chance of delayed pneumothorax or hemorrhage, hemoptysis, cardiopulmonary collapse, death. Questions regarding the procedure were encouraged and answered. The patient understands and consents to the procedure. The left chest wall was prepped with Betadine in a sterile fashion, and a sterile drape was applied covering the operative field. A sterile gown and sterile gloves were used for the procedure. Local anesthesia was provided with 1% Lidocaine. Ultrasound image documentation was performed. After creating a small skin incision, a 19 gauge needle was advanced into the pleural cavity under ultrasound guidance. A guide wire was then advanced under fluoroscopy into the pleural space. Pleural access was dilated serially and a 16-French peel-away sheath placed. The skin and subcutaneous tissues were generously infiltrated with 1% lidocaine from the puncture site over  the pleura along the intercostal margin anteriorly. A small stab incision was made with 11 blade scalpel at the insertion site of the catheter, and the catheter was back tunneled to the site at the pleural puncture. A tunneled CareFusion Pleurex catheter was placed. This was tunneled from the incision 5 cm anterior to the pleural access to the access site. The catheter was advanced through the peel-away sheath. The sheath was then removed. Final catheter positioning was confirmed with a fluoroscopic spot image. The access incision was closed with  Dermabond, applied to the catheterization incision. Large volume thoracentesis was performed through the new catheter utilizing gravity drainage bag. The patient tolerated the procedure well and remained hemodynamically stable throughout. No complications were encountered and no significant blood loss was encountered. IMPRESSION: Status post left-sided tunneled pleural catheter. Signed, Dulcy Fanny. Earleen Newport, DO [facility] Electronically Signed   By: Corrie Mckusick D.O.   On: 02/21/2018 08:45   US Thoracentesis Asp Pleural Space W/img Guide  Result Date: 02/14/2018 INDICATION: Patient with history of mantle cell lymphoma with recurrent symptomatic malignant left pleural effusion. Request made for therapeutic left thoracentesis. EXAM: ULTRASOUND GUIDED THERAPEUTIC LEFT THORACENTESIS MEDICATIONS: None COMPLICATIONS: None immediate. PROCEDURE: An ultrasound guided thoracentesis was thoroughly discussed with the patient and questions answered. The benefits, risks, alternatives and complications were also discussed. The patient understands and wishes to proceed with the procedure. Written consent was obtained. Ultrasound was performed to localize and mark an adequate pocket of fluid in the left chest. The area was then prepped and draped in the normal sterile fashion. 1% Lidocaine was used for local anesthesia. Under ultrasound guidance a 6 Fr Safe-T-Centesis catheter was introduced. Thoracentesis was performed. The catheter was removed and a dressing applied. FINDINGS: A total of approximately 2.4 liters of blood-tinged fluid was removed. IMPRESSION: Successful ultrasound guided therapeutic left thoracentesis yielding 2.4 liters of pleural fluid. Follow-up chest x-ray revealed no pneumothorax. Read by: Rowe Caulder, PA-C Electronically Signed   By: Jacqulynn Cadet M.D.   On: 02/14/2018 13:24     Daily pleur-x catheter drainage  Radiation SIM 6/5  XRT  6/6  Subjective: Wants to go home. Dyspnea is stable, controlled at rest, no need for oxygen. No chest pain or palpitations. Gets dizzy when he stands, but that has improved.  Discharge Exam: Vitals:   03/13/18 0720 03/13/18 0946  BP: 111/72   Pulse: (!) 121   Resp: 18 20  Temp: 97.7 F (36.5 C)   SpO2: 98%    General: Pt is alert, awake, not in acute distress Cardiovascular: RRR, S1/S2 +, no rubs, no gallops Respiratory: Dullness to percussion and decreased to auscultation on left with good right sided air movement. Nonlabored. Abdominal: Soft, NT, ND, bowel sounds + Extremities: No edema, no cyanosis  Labs: BNP (last 3 results) Recent Labs    01/27/18 1340 02/20/2018 1620  BNP 5.4 62.6   Basic Metabolic Panel: Recent Labs  Lab 03/07/18 0426 03/08/18 0437 03/09/18 0437 03/12/18 0344  NA 139 138 137 136  K 4.3 3.8 4.1 4.9  CL 102 101 99* 97*  CO2 25 25 26 28   GLUCOSE 130* 145* 133* 166*  BUN 14 16 15  21*  CREATININE 0.87 0.88 0.84 0.89  CALCIUM 9.0 8.9 8.9 9.1  PHOS 3.0 2.5 2.8  --    Liver Function Tests: Recent Labs  Lab 03/07/18 0426 03/08/18 0437 03/09/18 0437  ALBUMIN 2.8* 2.8* 3.2*   No results for input(s): LIPASE,  AMYLASE in the last 168 hours. No results for input(s): AMMONIA in the last 168 hours. CBC: Recent Labs  Lab 03/07/18 0426 03/08/18 0437 03/09/18 0437 03/12/18 0344  WBC 7.2 7.9 7.7 13.8*  NEUTROABS 5.2 6.0 6.9 11.7*  HGB 13.0 13.2 12.5* 12.4*  HCT 41.4 42.2 40.4 38.7*  MCV 91.4 92.7 92.2 88.8  PLT 187 191 172 192   Urinalysis    Component Value Date/Time   COLORURINE YELLOW 02/23/2018 1620   APPEARANCEUR CLEAR 02/11/2018 1620   LABSPEC 1.026 02/09/2018 1620   PHURINE 6.0 02/18/2018 1620   GLUCOSEU NEGATIVE 02/06/2018 1620   HGBUR SMALL (A) 02/05/2018 1620   BILIRUBINUR NEGATIVE 02/11/2018 1620   KETONESUR 5 (A) 03/05/2018 1620   PROTEINUR 30 (A) 03/03/2018 1620   NITRITE NEGATIVE 02/12/2018 1620   LEUKOCYTESUR  NEGATIVE 02/24/2018 1620   Time coordinating discharge: Approximately 40 minutes  Patrecia Pour, MD  [facility] 03/13/2018, 10:46 AM Pager [phone]

## 2018-03-13 NOTE — Progress Notes (Signed)
[facility] Therapy Treatment Record Phone [phone]   Radiation Therapy was administered to Kristopher Johnson on: 03/13/2018  3:41 PM and was treatment # 1 out of a planned course of 10 treatments.  Radiation Treatment  1). Beam photons with 6-10 energy  2). Brachytherapy None  3). Stereotactic Radiosurgery None  4). Other Radiation None     Bennett Scrape, RT (T)

## 2018-03-13 NOTE — Progress Notes (Signed)
Occupational Therapy Treatment Patient Details Name: Kristopher Johnson MRN: [id number] DOB: 04/23/1956 Today's Date: 03/13/2018    History of present illness This 62 year old man was admitted with dizziness; SOB with exertion.  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   OT comments  Walked to bathroom; cannot stand long.  Sat after I tried to take a standing BP (did not register) and also before adjusting pants for toileting  Follow Up Recommendations  Supervision/Assistance - 24 hour    Equipment Recommendations  3 in 1 bedside commode    Recommendations for Other Services      Precautions / Restrictions Precautions Precautions: Fall Precaution Comments: orthostatic, watch HR Restrictions Weight Bearing Restrictions: No       Mobility Bed Mobility Overal bed mobility: Independent                Transfers   Equipment used: Rolling walker (2 wheeled)   Sit to Stand: Min guard         General transfer comment: for safety; h/o orthostatic hypotension    Balance             Standing balance-Leahy Scale: Fair                             ADL either performed or assessed with clinical judgement   ADL                           Toilet Transfer: Minimal assistance;Ambulation;Comfort height toilet;RW   Toileting- Water quality scientist and Hygiene: Min guard;Sit to/from stand         General ADL Comments: pt fatiques easily. Placed chair between bed and toilet.  He felt OOB but not dizzy. Could not get a standing BP. Pt moved arm and could not stand long enough for me to try 2nd reading.  He would benefit from 3:1.  He plans to sponge bathe due to endurance and drain     Vision       Perception     Praxis      Cognition Arousal/Alertness: Awake/alert Behavior During Therapy: Flat affect Overall Cognitive Status: Within Functional Limits for tasks  assessed                                          Exercises     Shoulder Instructions       General Comments  could not get standing BP; did not register and pt could not stand long enough for second trial.  HR 120-124     Pertinent Vitals/ Pain       Pain Assessment: No/denies pain  Home Living                                          Prior Functioning/Environment              Frequency           Progress Toward Goals  OT Goals(current goals can now be found in the care plan section)  Progress towards OT goals: Progressing toward goals     Plan      Co-evaluation  AM-PAC PT "6 Clicks" Daily Activity     Outcome Measure   Help from another person eating meals?: None Help from another person taking care of personal grooming?: A Little Help from another person toileting, which includes using toliet, bedpan, or urinal?: A Little Help from another person bathing (including washing, rinsing, drying)?: A Lot Help from another person to put on and taking off regular upper body clothing?: A Little Help from another person to put on and taking off regular lower body clothing?: A Lot 6 Click Score: 17    End of Session        Activity Tolerance Patient limited by fatigue   Patient Left     Nurse Communication          Time: 9191-6606 OT Time Calculation (min): 27 min  Charges: OT General Charges $OT Visit: 1 Visit OT Treatments $Self Care/Home Management : 23-37 mins  Lesle Chris, OTR/L [phone] 03/13/2018   [place] 03/13/2018, 9:56 AM

## 2018-03-13 NOTE — Progress Notes (Signed)
1070mls of brownish red fluid drained from pleurx cath. Pt tolerated procedure well. Will continue to monitor.

## 2018-03-14 ENCOUNTER — Encounter (HOSPITAL_COMMUNITY): Payer: Self-pay | Admitting: Radiology

## 2018-03-14 ENCOUNTER — Inpatient Hospital Stay (HOSPITAL_COMMUNITY): Payer: Medicare Other

## 2018-03-14 ENCOUNTER — Ambulatory Visit
Admit: 2018-03-14 | Discharge: 2018-03-14 | Disposition: A | Discharge status: Home / Self Care | Payer: Medicare Other | Attending: Radiation Oncology | Admitting: Radiation Oncology

## 2018-03-14 DIAGNOSIS — Z79899 Other long term (current) drug therapy: Secondary | ICD-10-CM

## 2018-03-14 DIAGNOSIS — D72823 Leukemoid reaction: Secondary | ICD-10-CM

## 2018-03-14 DIAGNOSIS — Z66 Do not resuscitate: Secondary | ICD-10-CM

## 2018-03-14 DIAGNOSIS — E860 Dehydration: Secondary | ICD-10-CM

## 2018-03-14 DIAGNOSIS — R000 Tachycardia, unspecified: Secondary | ICD-10-CM

## 2018-03-14 DIAGNOSIS — R161 Splenomegaly, not elsewhere classified: Secondary | ICD-10-CM

## 2018-03-14 DIAGNOSIS — R944 Abnormal results of kidney function studies: Secondary | ICD-10-CM

## 2018-03-14 DIAGNOSIS — R7989 Other specified abnormal findings of blood chemistry: Secondary | ICD-10-CM

## 2018-03-14 DIAGNOSIS — N179 Acute kidney failure, unspecified: Secondary | ICD-10-CM

## 2018-03-14 DIAGNOSIS — Z923 Personal history of irradiation: Secondary | ICD-10-CM

## 2018-03-14 DIAGNOSIS — E875 Hyperkalemia: Secondary | ICD-10-CM

## 2018-03-14 LAB — LACTATE DEHYDROGENASE: LDH: 499 U/L — ABNORMAL HIGH (ref 98–192)

## 2018-03-14 LAB — DIC (DISSEMINATED INTRAVASCULAR COAGULATION) PANEL
APTT: 27 s (ref 24–36)
FIBRINOGEN: 561 mg/dL — AB (ref 210–475)
PROTHROMBIN TIME: 14.5 s (ref 11.4–15.2)

## 2018-03-14 LAB — BASIC METABOLIC PANEL
Anion gap: 15 (ref 5–15)
BUN: 26 mg/dL — ABNORMAL HIGH (ref 6–20)
CHLORIDE: 97 mmol/L — AB (ref 101–111)
CO2: 22 mmol/L (ref 22–32)
Calcium: 8.4 mg/dL — ABNORMAL LOW (ref 8.9–10.3)
Creatinine, Ser: 1.3 mg/dL — ABNORMAL HIGH (ref 0.61–1.24)
GFR calc non Af Amer: 56 mL/min — ABNORMAL LOW (ref >60)
Glucose, Bld: 202 mg/dL — ABNORMAL HIGH (ref 65–99)
POTASSIUM: 5.8 mmol/L — AB (ref 3.5–5.1)
SODIUM: 134 mmol/L — AB (ref 135–145)

## 2018-03-14 LAB — DIFFERENTIAL
Basophils Absolute: 0 10*3/uL (ref 0.0–0.1)
Basophils Relative: 0 %
EOS PCT: 0 %
Eosinophils Absolute: 0 10*3/uL (ref 0.0–0.7)
LYMPHS ABS: 0.6 10*3/uL — AB (ref 0.7–4.0)
LYMPHS PCT: 3 %
Monocytes Absolute: 1 10*3/uL (ref 0.1–1.0)
Monocytes Relative: 4 %
NEUTROS ABS: 20.6 10*3/uL — AB (ref 1.7–7.7)
Neutrophils Relative %: 93 %

## 2018-03-14 LAB — CBC
HEMATOCRIT: 39.8 % (ref 39.0–52.0)
Hemoglobin: 13 g/dL (ref 13.0–17.0)
MCH: 28.8 pg (ref 26.0–34.0)
MCHC: 32.7 g/dL (ref 30.0–36.0)
MCV: 88.1 fL (ref 78.0–100.0)
PLATELETS: 212 10*3/uL (ref 150–400)
RBC: 4.52 MIL/uL (ref 4.22–5.81)
RDW: 15.7 % — ABNORMAL HIGH (ref 11.5–15.5)
WBC: 22.6 10*3/uL — AB (ref 4.0–10.5)

## 2018-03-14 LAB — SAVE SMEAR

## 2018-03-14 LAB — DIC (DISSEMINATED INTRAVASCULAR COAGULATION)PANEL
D-Dimer, Quant: 2.6 ug/mL-FEU — ABNORMAL HIGH (ref 0.00–0.50)
INR: 1.14
Platelets: 195 10*3/uL (ref 150–400)
Smear Review: NONE SEEN

## 2018-03-14 LAB — PROCALCITONIN: Procalcitonin: 20.39 ng/mL

## 2018-03-14 LAB — URIC ACID: URIC ACID, SERUM: 6.9 mg/dL (ref 4.4–7.6)

## 2018-03-14 MED ORDER — PIPERACILLIN-TAZOBACTAM 3.375 G IVPB 30 MIN
3.3750 g | Freq: Once | INTRAVENOUS | Status: AC
  Administered 2018-03-14: 3.375 g via INTRAVENOUS
  Filled 2018-03-14 (×2): qty 50

## 2018-03-14 MED ORDER — LORAZEPAM 2 MG/ML IJ SOLN
1.0000 mg | Freq: Once | INTRAMUSCULAR | Status: AC
  Administered 2018-03-14: 1 mg via INTRAVENOUS
  Filled 2018-03-14: qty 1

## 2018-03-14 MED ORDER — PIPERACILLIN-TAZOBACTAM 3.375 G IVPB
3.3750 g | Freq: Three times a day (TID) | INTRAVENOUS | Status: DC
  Administered 2018-03-14 – 2018-03-20 (×16): 3.375 g via INTRAVENOUS
  Filled 2018-03-14 (×14): qty 50

## 2018-03-14 MED ORDER — ENSURE ENLIVE PO LIQD: 237.0000 mL | ORAL | Status: DC

## 2018-03-14 MED ORDER — PREMIER PROTEIN SHAKE
11.0000 [oz_av] | Freq: Two times a day (BID) | ORAL | Status: DC
  Administered 2018-03-14 – 2018-03-19 (×4): 11 [oz_av] via ORAL
  Filled 2018-03-14 (×13): qty 325.31

## 2018-03-14 MED ORDER — SODIUM CHLORIDE 0.9 % IV SOLN
1250.0000 mg | INTRAVENOUS | Status: DC
  Administered 2018-03-15 – 2018-03-17 (×3): 1250 mg via INTRAVENOUS
  Filled 2018-03-14 (×4): qty 1250

## 2018-03-14 MED ORDER — SODIUM POLYSTYRENE SULFONATE 15 GM/60ML PO SUSP
15.0000 g | Freq: Once | ORAL | Status: AC
  Administered 2018-03-14: 15 g via ORAL
  Filled 2018-03-14: qty 60

## 2018-03-14 MED ORDER — VANCOMYCIN HCL IN DEXTROSE 1-5 GM/200ML-% IV SOLN
1000.0000 mg | Freq: Once | INTRAVENOUS | Status: AC
  Administered 2018-03-14: 1000 mg via INTRAVENOUS
  Filled 2018-03-14: qty 200

## 2018-03-14 MED ORDER — FUROSEMIDE 10 MG/ML IJ SOLN
60.0000 mg | Freq: Once | INTRAMUSCULAR | Status: AC
  Administered 2018-03-14: 60 mg via INTRAVENOUS
  Filled 2018-03-14: qty 6

## 2018-03-14 MED ORDER — VANCOMYCIN HCL IN DEXTROSE 750-5 MG/150ML-% IV SOLN
750.0000 mg | Freq: Once | INTRAVENOUS | Status: AC
  Administered 2018-03-14: 750 mg via INTRAVENOUS
  Filled 2018-03-14: qty 150

## 2018-03-14 NOTE — Progress Notes (Signed)
Pharmacy Antibiotic Note  Kristopher Johnson is a 63 y.o. male admitted on 02/24/2018 with recurrent mantle cell lymphoma and worsening malignant pleural effusions. On 6/7, patient noted to have progressive neutrophilia concerning for sepsis vs SIRS from TLS. Pharmacy has been consulted for vancomycin and Zosyn dosing to r/o sepsis.  Plan:  Vancomycin 1000 + 750 mg IV now, then 1250 mg IV q24 hr (est AUC 488 based on SCr 1.3)  Measure vancomycin AUC at steady state as indicated  Zosyn 3.375 g IV given once over 30 minutes, then every 8 hrs by 4-hr infusion  Daily SCr while on Vanc + Zosyn d/t risk of nephrotoxicity  Height: 5\' 9"  (175.3 cm) Weight: 166 lb 7.2 oz (75.5 kg) IBW/kg (Calculated) : 70.7  Temp (24hrs), Avg:97.9 F (36.6 C), Min:97.8 F (36.6 C), Max:98 F (36.7 C)  Recent Labs  Lab 03/08/18 0437 03/09/18 0437 03/12/18 0344 03/14/18 0417  WBC 7.9 7.7 13.8* 22.6*  CREATININE 0.88 0.84 0.89 1.30*    Estimated Creatinine Clearance: 57.4 mL/min (A) (by C-G formula based on SCr of 1.3 mg/dL (H)).    No Known Allergies  Antimicrobials this admission: 6/7 vancomycin >>  6/7 Zosyn >>   Dose adjustments this admission: n/a  Microbiology results: None obtained this admission  Thank you for allowing pharmacy to be a part of this patient's care.  Reuel Boom, PharmD, BCPS [phone] 03/14/2018, 4:25 PM

## 2018-03-14 NOTE — Progress Notes (Signed)
Physical Therapy Treatment Patient Details Name: Kristopher Johnson MRN: [id number] DOB: 08/24/1956 Today's Date: 03/14/2018    History of Present Illness 62 year old man was admitted with dizziness; SOB with exertion.  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    PT Comments    Pt was willing to participate however he remains limited by orthostatic hypotension.  BP: supine-121/78; sitting-82/61; standing-74/56. Deferred any attempt at ambulation for safety. Assisted pt back to bed. Will continue to attempt to progress activity.     Follow Up Recommendations  Home health PT;Supervision/Assistance - 24 hour     Equipment Recommendations  Rolling walker with 5" wheels (possibly wheelchair if unable to safely ambulate)    Recommendations for Other Services       Precautions / Restrictions Precautions Precautions: Fall Precaution Comments: orthostatic, watch HR Restrictions Weight Bearing Restrictions: No    Mobility  Bed Mobility Overal bed mobility: Independent                Transfers Overall transfer level: Needs assistance   Transfers: Sit to/from Stand Sit to Stand: Min guard         General transfer comment: Ant-Post swaying while standing bedside bed.   Ambulation/Gait             General Gait Details: deferred to to low BP-see vitals section.    Stairs             Wheelchair Mobility    Modified Rankin (Stroke Patients Only)       Balance                                            Cognition Arousal/Alertness: Awake/alert Behavior During Therapy: Flat affect Overall Cognitive Status: Within Functional Limits for tasks assessed                                        Exercises      General Comments        Pertinent Vitals/Pain Pain Assessment: No/denies pain    Home Living                      Prior  Function            PT Goals (current goals can now be found in the care plan section) Progress towards PT goals: Not progressing toward goals - comment(limited by orthostatic hypotension)    Frequency    Min 3X/week      PT Plan Current plan remains appropriate    Co-evaluation              AM-PAC PT "6 Clicks" Daily Activity  Outcome Measure  Difficulty turning over in bed (including adjusting bedclothes, sheets and blankets)?: None Difficulty moving from lying on back to sitting on the side of the bed? : None Difficulty sitting down on and standing up from a chair with arms (e.g., wheelchair, bedside commode, etc,.)?: A Little Help needed moving to and from a bed to chair (including a wheelchair)?: A Little Help needed walking in hospital room?: A Little Help needed climbing 3-5 steps with a railing? : A Little 6 Click Score: 20    End of Session  Activity Tolerance: Treatment limited secondary to medical complications (Comment)(low BP) Patient left: in bed;with call bell/phone within reach;with bed alarm set         Time: 1610-1630 PT Time Calculation (min) (ACUTE ONLY): 20 min  Charges:  $Therapeutic Activity: 8-22 mins                    G Codes:          Weston Anna, MPT Pager: [phone]

## 2018-03-14 NOTE — Progress Notes (Addendum)
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 He has started his pallaitive RT since 6/6. Noted to have a bump in his creatinine and increasing potassium levels concerning for possible tumor lysis.  Patient is already on allopurinol.  Uric acid levels pending. Also noted to have generalized body aches and increasing neutrophilia concerning for possible sepsis versus paraneoplastic neutrophilia. Inadequate p.o. Intake. Appears to be getting dehydrated and is currently on IV fluids. Discussed current concerning clinical status with his daughter and granddaughter at bedside.  OBJECTIVE:    PHYSICAL EXAMINATION: . Vitals:   03/13/18 2100 03/13/18 2305 03/14/18 0532 03/14/18 1301  BP: (!) 94/46 111/66 114/75   Pulse: (!) 116 (!) 113 (!) 106   Resp: 16  19   Temp: 97.8 F (36.6 C)  98 F (36.7 C)   TempSrc: Oral  Oral   SpO2: 93%  92%   Weight:    166 lb 7.2 oz (75.5 kg)  Height:       Filed Weights   02/20/2018 2055 03/14/18 1301  Weight: 182 lb 5.1 oz (82.7 kg) 166 lb 7.2 oz (75.5 kg)   .Body mass index is 24.58 kg/m.  [name] Kitchen GENERAL:sleepy but arousable, generalized bodyache. SKIN: no acute rashes, no significant lesions EYES: conjunctiva are pink and non-injected, sclera anicteric OROPHARYNX: MMM, no exudates, no oropharyngeal erythema or ulceration NECK: supple, no JVD LYMPH:  no palpable lymphadenopathy in the cervical, axillary or inguinal regions LUNGS: decreased air entry left lung, indwelling pleurex catheter. HEART:  tachycardia ABDOMEN:  normoactive bowel sounds , non tender, not distended. Just palpable splenomegaly Extremity: no pedal edema PSYCH: alert & oriented x 3 with fluent speech NEURO: no focal motor/sensory deficits    MEDICAL HISTORY:  Past Medical History:  Diagnosis Date  . Abscess of arm, right  10/27/2015  . 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) 10/27/2015  . 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 Shortness of breath 10/27/2015    SURGICAL HISTORY: Past Surgical History:  Procedure Laterality Date  . Cataract surgery Bilateral    Patient notes she has had bilateral Surgery in 1998 and 99  . FLEXIBLE SIGMOIDOSCOPY N/A 03/01/2017   Procedure: FLEXIBLE SIGMOIDOSCOPY;  Surgeon: Manus Gunning, MD;  Location: [facility];  Service: Gastroenterology;  Laterality: N/A;  . IR GUIDED [place]  02/20/2018  . TONSILLECTOMY     about 62years old    SOCIAL HISTORY: Social History   Socioeconomic History  . Marital status: Widowed    Spouse name: Not on file  . Number of children: Not on file  . Years of education: Not on file  . Highest education level: Not on file  Occupational History  . Not on file  Social Needs  . Financial resource strain: Not on file  . Food insecurity:    Worry: Not on file    Inability: Not on file  . Transportation needs:    Medical: Not on file    Non-medical: Not on file  Tobacco Use  . Smoking status: Current Every Day Smoker    Packs/day: 0.50    Years: 33.00    Pack years: 16.50    Types: Cigarettes  . Smokeless tobacco: Never Used  Substance and Sexual Activity  .  Alcohol use: No  . Drug use: No  . Sexual activity: Not on file    Comment: Disabled, retinis pigmentosa legally blind, 4children MPOA Lynnn(cousin)  Lifestyle  . Physical activity:    Days per week: Not on file    Minutes per session: Not on file  . Stress: Not on file  Relationships  . Social connections:    Talks on phone: Not on file    Gets together: Not on file    Attends religious service: Not on file    Active member of club or organization: Not on file    Attends meetings of clubs or organizations: Not on file    Relationship status: Not  on file  . Intimate partner violence:    Fear of current or ex partner: Not on file    Emotionally abused: Not on file    Physically abused: Not on file    Forced sexual activity: Not on file  Other Topics Concern  . Not on file  Social History Narrative  . Not on file    FAMILY HISTORY: Family History  Problem Relation Age of Onset  . Colon cancer Neg Hx     ALLERGIES:  has No Known Allergies.  MEDICATIONS:  Scheduled Meds: . chlorhexidine  15 mL Mouth Rinse BID  . enoxaparin (LOVENOX) injection  40 mg Subcutaneous Q24H  . [START ON 03/15/2018] feeding supplement (ENSURE ENLIVE)  237 mL Oral Q24H  . Ibrutinib  560 mg Oral Daily  . mouth rinse  15 mL Mouth Rinse q12n4p  . mometasone   Topical Daily  . multivitamin with minerals  1 tablet Oral Daily  . polyethylene glycol  17 g Oral Daily  . protein supplement shake  11 oz Oral BID BM  . senna-docusate  1 tablet Oral BID   Continuous Infusions: . sodium chloride 110 mL/hr at 03/14/18 0915   PRN Meds:.alum & mag hydroxide-simeth, HYDROcodone-acetaminophen, morphine injection, ondansetron **OR** ondansetron (ZOFRAN) IV, sodium chloride flush, traMADol  REVIEW OF SYSTEMS:   .10 Point review of Systems was done is negative except as noted above.  LABORATORY DATA:  I have reviewed the data as listed  . CBC Latest Ref Rng & Units 03/14/2018 03/12/2018 03/09/2018  WBC 4.0 - 10.5 K/uL 22.6(H) 13.8(H) 7.7  Hemoglobin 13.0 - 17.0 g/dL 13.0 12.4(L) 12.5(L)  Hematocrit 39.0 - 52.0 % 39.8 38.7(L) 40.4  Platelets 150 - 400 K/uL 212 192 172  ANC 20.6K  CBC    Component Value Date/Time   WBC 22.6 (H) 03/14/2018 0417   RBC 4.52 03/14/2018 0417   HGB 13.0 03/14/2018 0417   HGB 13.7 02/21/2018 1028   HGB 13.8 09/17/2017 1313   HCT 39.8 03/14/2018 0417   HCT 43.2 09/17/2017 1313   PLT 212 03/14/2018 0417   PLT 157 02/21/2018 1028   PLT 202 09/17/2017 1313   MCV 88.1 03/14/2018 0417   MCV 96.4 09/17/2017 1313   MCH 28.8  03/14/2018 0417   MCHC 32.7 03/14/2018 0417   RDW 15.7 (H) 03/14/2018 0417   RDW 14.3 09/17/2017 1313   LYMPHSABS 0.6 (L) 03/14/2018 0417   LYMPHSABS 0.9 09/17/2017 1313   MONOABS 1.0 03/14/2018 0417   MONOABS 0.3 09/17/2017 1313   EOSABS 0.0 03/14/2018 0417   EOSABS 0.2 09/17/2017 1313   BASOSABS 0.0 03/14/2018 0417   BASOSABS 0.0 09/17/2017 1313    . CMP Latest Ref Rng & Units 03/14/2018 03/12/2018 03/09/2018  Glucose 65 - 99 mg/dL 202(H) 166(H) 133(H)  BUN 6 - 20 mg/dL 26(H) 21(H) 15  Creatinine 0.61 - 1.24 mg/dL 1.30(H) 0.89 0.84  Sodium 135 - 145 mmol/L 134(L) 136 137  Potassium 3.5 - 5.1 mmol/L 5.8(H) 4.9 4.1  Chloride 101 - 111 mmol/L 97(L) 97(L) 99(L)  CO2 22 - 32 mmol/L 22 28 26   Calcium 8.9 - 10.3 mg/dL 8.4(L) 9.1 8.9  Total Protein 6.5 - 8.1 g/dL - - -  Total Bilirubin 0.3 - 1.2 mg/dL - - -  Alkaline Phos 38 - 126 U/L - - -  AST 15 - 41 U/L - - -  ALT 17 - 63 U/L - - -     RADIOGRAPHIC STUDIES: I have personally reviewed the radiological images as listed and agreed with the findings in the report. Dg Chest 1 View  Result Date: 02/14/2018 CLINICAL DATA:  Status post LEFT thoracentesis today. EXAM: CHEST  1 VIEW COMPARISON:  02/06/2018 and prior studies FINDINGS: Decreased size of loculated LEFT pleural effusion noted. There is no evidence of pneumothorax. LEFT LOWER lung atelectasis/opacity again noted. A RIGHT Port-A-Cath with tip overlying the LOWER SVC again noted. The RIGHT lung is clear. IMPRESSION: Decreased loculated LEFT pleural effusion. No evidence of pneumothorax. Electronically Signed   By: Margarette Canada M.D.   On: 02/14/2018 12:55   Dg Chest 2 View  Result Date: 02/28/2018 CLINICAL DATA:  History of lung carcinoma with shortness of Breath EXAM: CHEST - 2 VIEW COMPARISON:  02/20/2018 PleurX, 02/14/2018 FINDINGS: Cardiac shadow is obscured by a significantly enlarged left-sided pleural effusion. A left PleurX catheter is noted in place. Minimal left lung aeration  is noted. Right chest wall port is seen in satisfactory position. Right lung is clear. No bony abnormality is noted. IMPRESSION: Enlarging left-sided pleural effusion. Electronically Signed   By: Inez Catalina M.D.   On: 02/08/2018 17:00   Ct Chest Wo Contrast  Result Date: 03/14/2018 CLINICAL DATA:  Respiratory illness. 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 EXAM: CT CHEST WITHOUT CONTRAST TECHNIQUE: Multidetector CT imaging of the chest was performed following the standard protocol without IV contrast. COMPARISON:  Chest CT angiogram dated 03/09/2018. chest CT dated 03/05/2018. Chest CT dated 01/28/2018. FINDINGS: Cardiovascular: Heart size is normal. Heart is displaced to the RIGHT. No pericardial effusion seen. No thoracic aortic aneurysm seen. Mediastinum/Nodes: There is extensive mediastinal lymphadenopathy, not significantly changed compared to previous CTs of 03/09/2018 and 03/05/2018, but significantly progressed compared to chest CT of 01/28/2018 Lungs/Pleura: Loculated appearing LEFT pleural effusion is decreased in size compared to most recent chest CT of 03/09/2018, chest tube in place with tip directed towards the lateral aspects of the LEFT upper lung. Multifocal masslike nodularity along the pleura throughout the LEFT chest. Most prominent component of this pleural masslike nodularity is again seen at the anterior aspects of the LEFT upper lung, measuring approximately 6.3 cm transverse dimension and 6.2 cm AP dimension (series 2, images 44 through 48), measuring only 2.5 cm on chest CT of 01/28/2018, also slightly increased in size compared to the most recent chest CT of 03/09/2018 where it measured approximately 5.8 x 5.7 cm. The mass effect on the mediastinum, with rightward mediastinal shift, is not significantly changed compared to the most recent chest CT of 03/09/2018. Small peripheral ground-glass opacity within the  lateral aspects of the RIGHT upper lobe, pneumonia versus edema. RIGHT lung otherwise clear. No pneumothorax seen. Upper Abdomen: Characterization of the upper abdomen is limited  by patient motion artifact. Musculoskeletal: No acute or suspicious osseous finding. Additional prominent lymph nodes in the LEFT axilla, significantly increased in size compared to previous chest CT of 01/28/2018, largest now with short axis measurement of approximately 2.2 cm (previously 1 cm). IMPRESSION: 1. Significant progression of disease compared to chest CT of 01/28/2018, and least mild progression of disease compared to the more recent chest CT of 03/09/2018, as detailed above. 2. LEFT pleural effusion, moderate to large in size, likely multiloculated, with chest tube in place, slightly decreased in size compared to most recent chest CT of 03/09/2018. 3. The mass effect on the mediastinum, with associated rightward mediastinal shift, is not significantly changed compared to the most recent chest CT. 4. Small peripheral ground-glass opacity within the lateral aspects of the RIGHT upper lobe, edema versus early pneumonia, favor edema or atelectasis. RIGHT lung is otherwise clear. 5. Enlarged lymph nodes in the LEFT axilla, increased in size compared to previous exams, again compatible with progression of disease. Electronically Signed   By: Franki Cabot M.D.   On: 03/14/2018 14:02   Ct Chest Wo Contrast  Result Date: 03/05/2018 CLINICAL DATA:  Shortness of breath, cough. EXAM: CT CHEST WITHOUT CONTRAST TECHNIQUE: Multidetector CT imaging of the chest was performed following the standard protocol without IV contrast. COMPARISON:  PET-CT 02/05/2018 and previous FINDINGS: Cardiovascular: Right IJ port catheter to the distal SVC. Heart size normal. No pericardial effusion. Mediastinum/Nodes: Left supraclavicular, anterior mediastinal, right paratracheal, and subcarinal adenopathy as before. There is mild rightward mediastinal shift  secondary to left pleural disease. Anterior left pleural mass is probably associated with mediastinal invasion. Lungs/Pleura: Progressive, near complete opacification of the left lung. Large left pleural effusion with good position of tunneled drain catheter. Extensive pleural thickening and masses. Right lung clear. Upper Abdomen: Stable epigastric adenopathy. Stable probable cyst in left hepatic lobe. No acute findings. Musculoskeletal: Spondylitic changes in the visualized lower cervical spine. No fracture or worrisome bone lesion. IMPRESSION: 1. Progressive opacification of left hemithorax secondary to nodular pleural disease and large effusion. 2. The tunneled left pleural drain catheter appears appropriately positioned, suggesting the patient may benefit from more aggressive thoracentesis. 3. Persistent left supraclavicular, mediastinal, and epigastric adenopathy. Electronically Signed   By: Lucrezia Europe M.D.   On: 03/05/2018 14:58   Ct Angio Chest Pe W Or Wo Contrast  Result Date: 03/09/2018 CLINICAL DATA:  62 year old male with stage IV lymphoma. Recurrent malignant effusion with catheter placed Feb 20, 2018. Dizziness and shortness of breath. EXAM: CT ANGIOGRAPHY CHEST WITH CONTRAST TECHNIQUE: Multidetector CT imaging of the chest was performed using the standard protocol during bolus administration of intravenous contrast. Multiplanar CT image reconstructions and MIPs were obtained to evaluate the vascular anatomy. CONTRAST:  134mL ISOVUE-370 IOPAMIDOL (ISOVUE-370) INJECTION 76% COMPARISON:  Chest CT Mar 05, 2018 FINDINGS: Cardiovascular: The heart mediastinum are shifted to the right due to a large left pleural effusion which will be described below. The thoracic aorta is normal in caliber with no atherosclerosis, aneurysm, or dissection. The heart size is normal and stable. No obvious coronary artery calcifications. Evaluation of pulmonary arterial branches on the right is limited due to significant  atelectatic lung due to the large right pleural effusion. No pulmonary emboli identified. Mediastinum/Nodes: There is a large left pleural effusion filling nearly the entire left hemithorax resulting in significant atelectatic/collapsed lung on the left. Only minimal aerated lung remains on the left. The effusion results in shift of the heart mediastinum to the right.  There is diffuse thickening of the right-sided pleura with pleural based masses remaining. The largest anterior pleural base mass on series 4, image 27 extends in the anterior chest wall, stable. No right-sided pleural effusion. No pericardial effusion. No right axillary adenopathy. Mildly enlarged nodes are seen in the base of the neck, particularly on the left. A 14 mm node is seen on image 5 in the base of the neck on the left. Left axillary and retropectoral adenopathy remains, similar in the interval. There is skin thickening and edema associated with the left chest wall including the breast. This does not appear to be confined to the breast. The chest wall is otherwise unremarkable. Adenopathy remains in the mediastinum. The esophagus and thyroid are unremarkable. Lungs/Pleura: There is only minimal aerated lung on the left. The right lung is free of infiltrate, nodule, or mass. No pneumothorax. The central airways are unremarkable. Upper Abdomen: Mild adenopathy remains in the upper abdomen. Musculoskeletal: No chest wall abnormality. No acute or significant osseous findings. Review of the MIP images confirms the above findings. IMPRESSION: 1. No pulmonary emboli identified. Evaluation of left-sided pulmonary arterial branches is limited due to near complete collapse of the left lung secondary to a large left pleural effusion. 2. There is a large left pleural effusion filling the left hemithorax resulting in significant collapse of the left lung. The pleura is thickened and there are multiple pleural-based masses, unchanged. The large left  effusion shifts the heart and mediastinum to the right. 3. Adenopathy in the left axilla, base of neck, left greater than right, mediastinum, and upper abdomen consistent with the patient's known lymphoma. Electronically Signed   By: Dorise Bullion III M.D   On: 03/09/2018 08:50   Dg Chest Left Decubitus  Result Date: 02/22/2018 CLINICAL DATA:  History left pleural effusion and prior tunneled PleurX catheter placement. Possible enlarging loculated pleural effusion by chest x-ray. EXAM: CHEST - LEFT DECUBITUS COMPARISON:  Chest x-ray earlier today. FINDINGS: It is difficult to determine by a decubitus chest x-ray whether there is a significant component of pleural fluid. It does not appear that there is a large amount layering fluid, but there may be some layering component. Underlying atelectasis and/or consolidation of the lower lung is suspected. IMPRESSION: It is difficult to tell by decubitus chest x-ray whether there is a significant component of pleural fluid present. A CT of the chest would be needed to determine whether fluid is present and if so, its relationship to the existing tunneled PleurX drainage catheter. Electronically Signed   By: Aletta Edouard M.D.   On: 02/12/2018 20:41   Ir Guided Niel Hummer W Catheter Placement  Result Date: 02/21/2018 INDICATION: 62 year old male with a history of recurrent malignant left-sided pleural effusion. EXAM: IMAGE GUIDED TUNNELED PLEURAL CATHETER MEDICATIONS: 2.0 g Ancef. The antibiotics were administered within an appropriate time frame prior to the initiation of the procedure. ANESTHESIA/SEDATION: Fentanyl 100 mcg IV; Versed 2.0 mg IV Moderate Sedation Time:  13 minutes The patient was continuously monitored during the procedure by the interventional radiology nurse under my direct supervision. COMPLICATIONS: None PROCEDURE: The procedure, risks, benefits, and alternatives were explained to the patient and the patient's family. Specific risks that were  addressed included bleeding, infection, pneumothorax, need for further procedure, chance of delayed pneumothorax or hemorrhage, hemoptysis, cardiopulmonary collapse, death. Questions regarding the procedure were encouraged and answered. The patient understands and consents to the procedure. The left chest wall was prepped with Betadine in a sterile fashion, and a  sterile drape was applied covering the operative field. A sterile gown and sterile gloves were used for the procedure. Local anesthesia was provided with 1% Lidocaine. Ultrasound image documentation was performed. After creating a small skin incision, a 19 gauge needle was advanced into the pleural cavity under ultrasound guidance. A guide wire was then advanced under fluoroscopy into the pleural space. Pleural access was dilated serially and a 16-French peel-away sheath placed. The skin and subcutaneous tissues were generously infiltrated with 1% lidocaine from the puncture site over the pleura along the intercostal margin anteriorly. A small stab incision was made with 11 blade scalpel at the insertion site of the catheter, and the catheter was back tunneled to the site at the pleural puncture. A tunneled CareFusion Pleurex catheter was placed. This was tunneled from the incision 5 cm anterior to the pleural access to the access site. The catheter was advanced through the peel-away sheath. The sheath was then removed. Final catheter positioning was confirmed with a fluoroscopic spot image. The access incision was closed with Dermabond, applied to the catheterization incision. Large volume thoracentesis was performed through the new catheter utilizing gravity drainage bag. The patient tolerated the procedure well and remained hemodynamically stable throughout. No complications were encountered and no significant blood loss was encountered. IMPRESSION: Status post left-sided tunneled pleural catheter. Signed, Dulcy Fanny. Earleen Newport, DO [facility] Electronically Signed   By: Corrie Mckusick D.O.   On: 02/21/2018 08:45   US Thoracentesis Asp Pleural Space W/img Guide  Result Date: 02/14/2018 INDICATION: Patient with history of mantle cell lymphoma with recurrent symptomatic malignant left pleural effusion. Request made for therapeutic left thoracentesis. EXAM: ULTRASOUND GUIDED THERAPEUTIC LEFT THORACENTESIS MEDICATIONS: None COMPLICATIONS: None immediate. PROCEDURE: An ultrasound guided thoracentesis was thoroughly discussed with the patient and questions answered. The benefits, risks, alternatives and complications were also discussed. The patient understands and wishes to proceed with the procedure. Written consent was obtained. Ultrasound was performed to localize and mark an adequate pocket of fluid in the left chest. The area was then prepped and draped in the normal sterile fashion. 1% Lidocaine was used for local anesthesia. Under ultrasound guidance a 6 Fr Safe-T-Centesis catheter was introduced. Thoracentesis was performed. The catheter was removed and a dressing applied. FINDINGS: A total of approximately 2.4 liters of blood-tinged fluid was removed. IMPRESSION: Successful ultrasound guided therapeutic left thoracentesis yielding 2.4 liters of pleural fluid. Follow-up chest x-ray revealed no pneumothorax. Read by: Rowe Taro, PA-C Electronically Signed   By: Jacqulynn Cadet M.D.   On: 02/14/2018 13:24    ASSESSMENT & PLAN:   62 y.o. 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 ECHO nl EF ECOG PS 2 but activities significantly limited due to being legally blind HIV/Hep C/Hep B neg  PET/CT scan after 3 cycles of R CHOP show good overall response. Stomach lesion still with very FDG uptake ? Residual lymphoma versus inflammation.  Patient is  status post 6 cycles of R CHOP. PET/CT after 6 cycles shows resolution of hypermetabolic lymphadenopathy and gastric wall activity . Patient was on maintenance Rituxan for 1 year now prior to relapse. -Labs reviewed today (02/21/18) of CBC, CMP, Reticulocytes, Uric acid, phosphorus, and LDH is as follows: all values are WNL except for CBC with WBC at 11.5. CMP with total protein at 5.6, albumin at 2.8.  LDH decreased to 472.  -I discussed that we will monitor his counts and give IVF weekly as well as add on rituaxin.  -F/u as scheduled.   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 CT chest abdomen pelvis showed multiple areas of lymphadenopathy in the abdomen as well as in the chest suggesting significant recurrence. PET/CT scan 03/25/2017 shows diffuse involvement with mantle cell lymphoma involving the chest abdomen pelvis and bowels. 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 Workup showed malignant pleural effusion and b/l pleural involvement and rectal urgency due to mantle cell lymphoma causing mass in rectum/sigmoid -PET/CT 10/25/2017 - with significant mantle cell lymphoma progression again. -He was placed onIbrutinib 560 mg p.o. Daily in 10/2017 and has resolution of most of his symptoms and normalization of elevated LDH levels.  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 PET/CT 02/05/2018 with mixed treatment response. S/p pleurx catheter placement. Was on Ibrutinib + added Rituxan and Venetoclax but could not tolerate Venetoclax at 100mg  po daily Venetoclax was held Admitted with uncontrolled left pleural  effusion. Started on palliative RT to left lung/pleural disease from 03/13/2018  5) Rapidly developing splenomegaly related to relapsed MCL  6) AKI with hyperkalemia today 03/14/2018 ? TLS . Already on allopurinol. Hypovolemia from significant recurrent need for pleural effusion drainage.  7) New progressive neutrophilia concern for sepsis vs SIRS from tumor lysis. PLAN:  -hold Ibrutinib at this time -labs for TLS evaluation -panculture -if increased uric acid will consider Rasburicase -IVF/Albumin support to maintain intravascular volume. -consider empiric abx per hospitalist pending cultures -pleural effusion for pleural fluid analysis. -I had a goals of care discussion with patient's daughter and grand daughter -- they understand his serious condition and reconfirm DNR status. -Mx of hyperkalemia per hospitalist  8) Retinitis pigmentosa causing legal blindness -  is seeing a retina specialist in town. No acute interventions at this time. Notes that his vision is progressively getting worse. His daughter and granddaughter are helping him out at home.    9) Weight loss and protein calorie malnutrition.  . Wt Readings from Last 3 Encounters:  03/14/18 166 lb 7.2 oz (75.5 kg)  02/24/18 184 lb 12.8 oz (83.8 kg)  02/21/18 184 lb (83.5 kg)  -weight loss appears to also be a reflection of dehydration + lean body mass loss.  . The total time spent in the appointment was 40 minutes and more than 50% was on counseling and direct patient cares co-ordinating cares with hospitalist team.   Sullivan Lone MD [place] AAHIVMS [facility] Hematology/Oncology Physician [facility]  (Office):       [phone] (Work cell):  [phone] (Fax):           [phone]  03/14/2018 2:20 PM

## 2018-03-14 NOTE — Progress Notes (Addendum)
PROGRESS NOTE  Kristopher Johnson  WNU:[id number] DOB: 06-12-1956 DOA: 02/08/2018 PCP: Patient, No Pcp Per  Outpatient Specialists: Oncology, Irene Limbo Brief Narrative: DAXTON Johnson is a 62 y.o. 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 Normally has drainage by HH-RN every 48hrs but this seemed to be accumulating more rapidly.  Patient attributed all his new symptoms to his medication venetoclax which has been gradually increased by his oncologist Dr. Irene Limbo who later recommended patient stop the medication.  In the ED, chest x-ray showed large left-sided pleural effusion, and he was found to have orthostatic hypotension. Oncology was consulted, recommended discontinuing venetoclax, continuing ibrutinib. Radiation oncology consulted and are planning 2 weeks of palliative radiation. Pleur-x catheter is being drained daily and will need to be continued at home, family has been instructed. Palliative care consulted as well, though pt wishes for continued active treatment, not hospice care.   Assessment & Plan: Principal Problem:   Pleural effusion, left Active Problems:   Mantle cell lymphoma of lymph nodes of multiple regions [facility])   Legally blind   Port-A-Cath in place   Counseling regarding advance care planning and goals of care   Malignant pleural effusion   Palliative care encounter  Stage IVB mantle cell lymphoma, relapsed:  - Oncology, Dr. Irene Limbo has visited with the patient. Continues to recommend ibrutinib, rituxan q3 weeks. Holding venetoclax as this may have worsened dyspnea, though he is considering restart at lower dose if improving (LDH improved on this, indicating response). Also notify Dr. Irene Limbo that his labs are significantly worse on 6/7, his white count has gone up,  repeating LDH, uric acid, white count has significantly increased,differential  Consistent with predominant lymphocytosis - Palliative care team consulted. Option of hospice was broached but his goals were for continued treatment with active treatments. Recommended supportive care, norco for pain and confirmed DNR. - Rad Onc consulted: Planning palliative radiation x2 weeks beginning 6/6 in hopes of reducing effusion. Given worsening counts wondered about DIC, tumor lysis syndrome,repeat CT of the chest 6/7 showed progression of disease, moderate size left pleural effusion multiloculated, mass effect on the mediastinal which is unchanged, possible developing pulmonary edema, will give Lasix 60 mg IV 1   Malignant pleural effusion on left: Recurrent.  - Will continue daily pleur-x catheter drainage with IV albumin if pt becomes symptomatically hypotensive (nursing care order placed).patient needs albumin infusions at home as per oncology    Dehydration: With 1L output from pleur-x daily in addition to other losses and his decreased po intake, I suspect he is not keeping up. BUN has been slowly trending upward.  - Started IV fluids.    Leukocytosis: No fever or change in symptoms or infiltrate on CTA chest. ?Dehydration -patient was being monitored off antibiotics, white count significantly elevated associated with left-sided chest pain before we will repeat a CT scan. Also obtaining peripheral smear, LDH, differential  Orthostatic hypotension: Due to malnutrition/decreased po intake, malignancy, recurrent effusion and drainage/fluid shifts.  - Continue to push po hydration, supplementation, and IV albumin prn. 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  - Has help at home  Severe protein-calorie malnutrition:  - Supplements as ordered - Multivitamin with minerals  DVT prophylaxis: Lovenox Code  Status:  DNR Family Communication: Daughter at bedside this PM Disposition Plan: Home when stable.  Consultants:   Oncology  Radiation oncology  Procedures:   Daily pleur-x catheter drainage  Radiation SIM 6/5  Antimicrobials:  None   Subjective:   Patient extremely apprehensive about generalized pain everywhere yesterday. He just does not feel good. He has been restless. Significant increase in pain everywhere.   Objective: Vitals:   03/13/18 1229 03/13/18 2100 03/13/18 2305 03/14/18 0532  BP: 106/60 (!) 94/46 111/66 114/75  Pulse: (!) 108 (!) 116 (!) 113 (!) 106  Resp: 17 16  19   Temp: 98.7 F (37.1 C) 97.8 F (36.6 C)  98 F (36.7 C)  TempSrc: Oral Oral  Oral  SpO2: 91% 93%  92%  Weight:      Height:        Intake/Output Summary (Last 24 hours) at 03/14/2018 1221 Last data filed at 03/14/2018 0200 Gross per 24 hour  Intake 1068.83 ml  Output -  Net 1068.83 ml   Filed Weights   02/06/2018 2055  Weight: 82.7 kg (182 lb 5.1 oz)    Gen: Pleasant, chronically ill-appearing male in no distress Pulm: Not labored at rest. Dullness to percussion and decreased breath sounds on auscultation on the left. No wheezes or crackles.. CV: Regular rate and rhythm. No murmur, rub, or gallop. No JVD, trace pedal edema. GI: Abdomen soft, non-tender, non-distended, with normoactive bowel sounds. No organomegaly or masses felt. Ext: Warm, no deformities. Decreased muscle bulk. Skin: No rashes, lesions or ulcers. Right chest port site c/d/i. Diffuse striae indicating rapid weight loss.  Neuro: Alert and oriented. Light perception visual acuity. No focal neurological deficits. Psych: Judgement and insight appear normal. Mood & affect appropriate.   Data Reviewed: I have personally reviewed following labs and imaging studies  CBC: Recent Labs  Lab 03/08/18 0437 03/09/18 0437 03/12/18 0344 03/14/18 0417  WBC 7.9 7.7 13.8* 22.6*  NEUTROABS 6.0 6.9 11.7*  --   HGB 13.2 12.5*  12.4* 13.0  HCT 42.2 40.4 38.7* 39.8  MCV 92.7 92.2 88.8 88.1  PLT 191 172 192 268   Basic Metabolic Panel: Recent Labs  Lab 03/08/18 0437 03/09/18 0437 03/12/18 0344 03/14/18 0417  NA 138 137 136 134*  K 3.8 4.1 4.9 5.8*  CL 101 99* 97* 97*  CO2 25 26 28 22   GLUCOSE 145* 133* 166* 202*  BUN 16 15 21* 26*  CREATININE 0.88 0.84 0.89 1.30*  CALCIUM 8.9 8.9 9.1 8.4*  PHOS 2.5 2.8  --   --    GFR: Estimated Creatinine Clearance: 57.4 mL/min (A) (by C-G formula based on SCr of 1.3 mg/dL (H)). Liver Function Tests: Recent Labs  Lab 03/08/18 0437 03/09/18 0437  ALBUMIN 2.8* 3.2*   No results for input(s): LIPASE, AMYLASE in the last 168 hours. No results for input(s): AMMONIA in the last 168 hours. Coagulation Profile: No results for input(s): INR, PROTIME in the last 168 hours. Cardiac Enzymes: No results for input(s): CKTOTAL, CKMB, CKMBINDEX, TROPONINI in the last 168 hours. BNP (last 3 results) No results for input(s): PROBNP in the last 8760 hours. HbA1C: No results for input(s): HGBA1C in the last 72 hours. CBG: No results for input(s): GLUCAP in the last 168 hours. Lipid Profile: No results for input(s): CHOL, HDL, LDLCALC, TRIG, CHOLHDL, LDLDIRECT in the last 72 hours. Thyroid Function Tests: No results for input(s): TSH, T4TOTAL, FREET4, T3FREE, THYROIDAB in the last 72 hours. Anemia Panel: No results for input(s): VITAMINB12,  FOLATE, FERRITIN, TIBC, IRON, RETICCTPCT in the last 72 hours. Urine analysis:    Component Value Date/Time   COLORURINE YELLOW 02/18/2018 1620   APPEARANCEUR CLEAR 02/22/2018 1620   LABSPEC 1.026 02/13/2018 1620   PHURINE 6.0 02/26/2018 1620   GLUCOSEU NEGATIVE 02/18/2018 1620   HGBUR SMALL (A) 02/05/2018 1620   BILIRUBINUR NEGATIVE 02/09/2018 1620   KETONESUR 5 (A) 02/20/2018 1620   PROTEINUR 30 (A) 02/07/2018 1620   NITRITE NEGATIVE 02/23/2018 1620   LEUKOCYTESUR NEGATIVE 02/23/2018 1620   No results found for this or any  previous visit (from the past 240 hour(s)).    Radiology Studies: No results found.  Scheduled Meds: . chlorhexidine  15 mL Mouth Rinse BID  . enoxaparin (LOVENOX) injection  40 mg Subcutaneous Q24H  . feeding supplement (ENSURE ENLIVE)  237 mL Oral TID BM  . Ibrutinib  560 mg Oral Daily  . mouth rinse  15 mL Mouth Rinse q12n4p  . mometasone   Topical Daily  . multivitamin with minerals  1 tablet Oral Daily  . polyethylene glycol  17 g Oral Daily  . senna-docusate  1 tablet Oral BID   Continuous Infusions: . sodium chloride 110 mL/hr at 03/14/18 0915     LOS: 7 days   Time spent: 25 minutes.  Kristopher Dumas, MD [facility] [email or web address] Password [email or web address] 03/14/2018, 12:21 PM

## 2018-03-14 NOTE — Progress Notes (Signed)
Nutrition Follow-up  DOCUMENTATION CODES:   Not applicable  INTERVENTION:  - Will decrease Ensure Enlive po BID, each supplement provides 350 kcal and 20 grams of protein - Will order Premier Protein BID, each supplement provides 160 kcal and 30 grams of protein.  - Will order Magic Cup with dinner meals, this supplement provides 290 kcal and 9 grams of protein. - Will order daily multivitamin with minerals. - Continue to encourage PO intakes.   NUTRITION DIAGNOSIS:   Inadequate oral intake related to acute illness, decreased appetite, nausea as evidenced by per patient/family report, meal completion < 25% -ongoing  GOAL:   Patient will meet greater than or equal to 90% of their needs -unmet  MONITOR:   PO intake, Supplement acceptance, Weight trends, Labs  ASSESSMENT:   62 y.o. 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  Brought to the ED via EMS with complaints of dizziness on standing of ~ 4 days duration, such that patient was not able to get out of bed on the day of admission. SOB with exertion--more chronic, over the past month, with associated orthopnea. Since Pleurex placement, 1L pleural fluid has been drained every 48 hours by home health nurse who comes to patient's house. Patient is on venetoclax for his lymphoma. Initial dose was 20 mg, with gradual increase in dose every week. The day before admission, patient took 100 mg.  Patient states since he started taking the medication he has gotten progressively worse, and he feels his current symptoms especially dizziness is related to increased dose of venetoclax. Patient reports nausea and poor p.o. intake since starting venetoclax.  No new weight since admission; will order for weight to be obtained today. Per chart review, pt has mainly been eating 0% to bites of meals since  RD assessment on 6/3. He has only accepted 4 of 12 bottles of Ensure since that time. Noted 3 bottles of unopened Ensure in patient's room at this time. Lunch tray on bedside table and is untouched. He was hunched over and writhing at the time of RD visit and states that he needs pain medication. RD talked to RN who reported that pt had received pain medication ~20 minutes prior. Unable to obtain any information from patient at this time.   Patient to receive 2 weeks of Palliative radiation and first treatment was yesterday. He is scheduled to go shortly (this afternoon) for his second treatment. Treatment in the hope of decreasing pleural effusion. Patient has Pleur-X cath.   Per Dr. Ledell Peoples note this AM: patient with stage 4B mantle cell lymphoma (relapse), Palliative Care consulted and patient is now DNR but would like to continue current treatments, plan to repeat CT chest to r/o PNA or worsening pleural effusion, 1L output from Pleur-X/day leading to dehydration in combination with poor PO intakes, leukocytosis, orthostatic hypotension, blindness d/t retinitis pigmentosa, severe malnutrition.  RD is unable to state malnutrition based on ASPEN guidelines at this time d/t no findings on NFPE, unable to determine weight changes. Pt has been consuming <50% of energy needs for >/= 5 days. Will continue to monitor and will update documentation if able. Patient at very high risk of malnutrition and likely is malnourished, but unable to confirm.   Medications reviewed; 1 packet Miralax/day, 1 tablet Senokot BID, 15 g Kayexalate x1 dose today. Labs reviewed; Na: 134 mmol/L, K: 5.8 mmol/L, Cl: 97 mmol/L,  BUN: 26 mg/dL, creatinine: 1.3 mg/dL, Ca: 8.4 mg/dL.  IVF: NS @ 110 mL/hr.    Diet Order:   Diet Order           Diet general        Diet regular Room service appropriate? Yes; Fluid consistency: Thin  Diet effective now          EDUCATION NEEDS:   No education needs have been identified at  this time  Skin:  Skin Assessment: Reviewed RN Assessment  Last BM:  6/4  Height:   Ht Readings from Last 1 Encounters:  02/05/2018 5\' 9"  (1.753 m)    Weight:   Wt Readings from Last 1 Encounters:  03/03/2018 182 lb 5.1 oz (82.7 kg)    Ideal Body Weight:  72.73 kg  BMI:  Body mass index is 26.92 kg/m.  Estimated Nutritional Needs:   Kcal:  4496-7591 (26-28 kcal/kg)  Protein:  116-132 grams (1.4-1.6 grams/kg)  Fluid:  >/= 2.1 L/day      Jarome Matin, MS, RD, LDN, [facility] Inpatient Clinical Dietitian Pager # [phone] After hours/weekend pager # [phone]

## 2018-03-15 DIAGNOSIS — D72823 Leukemoid reaction: Secondary | ICD-10-CM

## 2018-03-15 LAB — COMPREHENSIVE METABOLIC PANEL
ALK PHOS: 69 U/L (ref 38–126)
ALT: 14 U/L — AB (ref 17–63)
AST: 19 U/L (ref 15–41)
Albumin: 2.4 g/dL — ABNORMAL LOW (ref 3.5–5.0)
Anion gap: 15 (ref 5–15)
BUN: 30 mg/dL — AB (ref 6–20)
CHLORIDE: 95 mmol/L — AB (ref 101–111)
CO2: 24 mmol/L (ref 22–32)
CREATININE: 1.31 mg/dL — AB (ref 0.61–1.24)
Calcium: 8.4 mg/dL — ABNORMAL LOW (ref 8.9–10.3)
GFR calc Af Amer: >60 mL/min (ref >60)
GFR, EST NON AFRICAN AMERICAN: 56 mL/min — AB (ref >60)
Glucose, Bld: 165 mg/dL — ABNORMAL HIGH (ref 65–99)
Potassium: 4.6 mmol/L (ref 3.5–5.1)
Sodium: 134 mmol/L — ABNORMAL LOW (ref 135–145)
Total Bilirubin: 1.2 mg/dL (ref 0.3–1.2)
Total Protein: 4.9 g/dL — ABNORMAL LOW (ref 6.5–8.1)

## 2018-03-15 LAB — CBC
HCT: 38.6 % — ABNORMAL LOW (ref 39.0–52.0)
Hemoglobin: 12.8 g/dL — ABNORMAL LOW (ref 13.0–17.0)
MCH: 28.8 pg (ref 26.0–34.0)
MCHC: 33.2 g/dL (ref 30.0–36.0)
MCV: 86.9 fL (ref 78.0–100.0)
PLATELETS: 189 10*3/uL (ref 150–400)
RBC: 4.44 MIL/uL (ref 4.22–5.81)
RDW: 15.8 % — AB (ref 11.5–15.5)
WBC: 18.7 10*3/uL — AB (ref 4.0–10.5)

## 2018-03-15 LAB — CALCIUM, IONIZED: CALCIUM, IONIZED, SERUM: 4.6 mg/dL (ref 4.5–5.6)

## 2018-03-15 MED ORDER — SODIUM CHLORIDE 0.9 % IV SOLN
INTRAVENOUS | Status: DC
  Administered 2018-03-15: 75 mL/h via INTRAVENOUS
  Administered 2018-03-15 – 2018-03-21 (×8): via INTRAVENOUS

## 2018-03-15 MED ORDER — MORPHINE SULFATE (PF) 4 MG/ML IV SOLN
4.0000 mg | INTRAVENOUS | Status: DC | PRN
  Administered 2018-03-15 – 2018-03-23 (×40): 4 mg via INTRAVENOUS
  Filled 2018-03-15 (×42): qty 1

## 2018-03-15 NOTE — Progress Notes (Signed)
[facility] PROGRESS NOTE    Progress Note  RHEA THRUN  VOH:[id number] DOB: 1955/10/15 DOA: 02/11/2018 PCP: Patient, No Pcp Per     Brief Narrative:   MAHMOUD BLAZEJEWSKI is an 62 y.o. 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  Radiation oncology was consulted and recommended 2 weeks of elective radiation.  I taped care was consulted and patient and would like to continue aggressive treatment  Assessment/Plan:   Mantle cell lymphoma of lymph nodes of multiple regions [facility]): Oncology was consulted and recommended continuing ibrutinib, rituxan q3 weeks. Holding venetoclax. LDH and phosphorus are improving indicating good response. Palliative care saw the patient and the patient would like to continue with aggressive treatments, they did confirm the DNR. Continue Norco for pain. [facility] oncology was consulted and recommended radiation for 2 weeks beginning on 03/13/2018 in hopes of reducing the effusion. Fibrinogen levels were high, which points against DIC. We will drain pleural fluid twice a day.  As he relates he continues to be short of breath.  Recurrent malignant pleural effusion, left Continue drainage of fluid Lasix. Use IV albumin as needed for hypertension.  Acute kidney injury With 1 L output through Pleurx catheter and probably decrease oral intake. Started on IV fluids.  Leukocytosis: No fever no change in infiltrate. He was started empirically IV vancomycin he has been pancultured. We will send pleural fluid for culture.  Orthostatic hypotension: Likely due to  decreased oral intake and large amount of output through the Pleurx catheter continue push oral hydration and IV IV albumin as needed.  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  Check a phosphorus level. Uric acid yesterday was 6.9 hyperkalemia is improving.  This does not appear to be tumor lysis syndrome.  DVT prophylaxis: lovexno Family Communication:none Disposition Plan/Barrier to D/C: unable to determine Code Status:     Code Status Orders  (From admission, onward)        Start     Ordered   03/06/2018 2328  Do not attempt resuscitation (DNR)  Continuous    Question Answer Comment  In the event of cardiac or respiratory ARREST Do not call a "code blue"   In the event of cardiac or respiratory ARREST Do not perform Intubation, CPR, defibrillation or ACLS   In the event of cardiac or respiratory ARREST Use medication by any route, position, wound care, and other measures to relive pain and suffering. May use oxygen, suction and manual treatment of airway obstruction as needed for comfort.      03/07/2018 2327    Code Status History    Date Active Date Inactive Code Status Order ID Comments User Context   03/02/2018 2116 02/20/2018 2327 Full Code [id number]  [name], MD Inpatient   02/08/2018 1948 02/07/2018 2116 DNR [id number]  [name], MD ED   02/03/2018 1937 02/04/2018 1553 DNR [id number]  [name], MD ED   01/27/2018 1832 01/30/2018 1603 Full Code [id number]  [name], MD Inpatient   10/25/2017 1628 11/01/2017 1610 Full Code [id number]  [name], MD Inpatient  03/01/2017 0123 03/02/2017 1336 Full Code [id number]  [name], MD ED   10/24/2015 2317 10/25/2015 2051 Full Code [id number]  [name], MD Inpatient        IV Access:    Peripheral IV   Procedures and diagnostic studies:   Ct Chest Wo Contrast  Result Date: 03/14/2018 CLINICAL DATA:   Respiratory illness. 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 EXAM: CT CHEST WITHOUT CONTRAST TECHNIQUE: Multidetector CT imaging of the chest was performed following the standard protocol without IV contrast. COMPARISON:  Chest CT angiogram dated 03/09/2018. chest CT dated 03/05/2018. Chest CT dated 01/28/2018. FINDINGS: Cardiovascular: Heart size is normal. Heart is displaced to the RIGHT. No pericardial effusion seen. No thoracic aortic aneurysm seen. Mediastinum/Nodes: There is extensive mediastinal lymphadenopathy, not significantly changed compared to previous CTs of 03/09/2018 and 03/05/2018, but significantly progressed compared to chest CT of 01/28/2018 Lungs/Pleura: Loculated appearing LEFT pleural effusion is decreased in size compared to most recent chest CT of 03/09/2018, chest tube in place with tip directed towards the lateral aspects of the LEFT upper lung. Multifocal masslike nodularity along the pleura throughout the LEFT chest. Most prominent component of this pleural masslike nodularity is again seen at the anterior aspects of the LEFT upper lung, measuring approximately 6.3 cm transverse dimension and 6.2 cm AP dimension (series 2, images 44 through 48), measuring only 2.5 cm on chest CT of 01/28/2018, also slightly increased in size compared to the most recent chest CT of 03/09/2018 where it measured approximately 5.8 x 5.7 cm. The mass effect on the mediastinum, with rightward mediastinal shift, is not significantly changed compared to the most recent chest CT of 03/09/2018. Small peripheral ground-glass opacity within the lateral aspects of the RIGHT upper lobe, pneumonia versus edema. RIGHT lung otherwise clear. No pneumothorax seen. Upper Abdomen: Characterization of the upper abdomen is limited by patient motion artifact. Musculoskeletal: No acute or suspicious osseous finding. Additional prominent lymph nodes in the  LEFT axilla, significantly increased in size compared to previous chest CT of 01/28/2018, largest now with short axis measurement of approximately 2.2 cm (previously 1 cm). IMPRESSION: 1. Significant progression of disease compared to chest CT of 01/28/2018, and least mild progression of disease compared to the more recent chest CT of 03/09/2018, as detailed above. 2. LEFT pleural effusion, moderate to large in size, likely multiloculated, with chest tube in place, slightly decreased in size compared to most recent chest CT of 03/09/2018. 3. The mass effect on the mediastinum, with associated rightward mediastinal shift, is not significantly changed compared to the most recent chest CT. 4. Small peripheral ground-glass opacity within the lateral aspects of the RIGHT upper lobe, edema versus early pneumonia, favor edema or atelectasis. RIGHT lung is otherwise clear. 5. Enlarged lymph nodes in the LEFT axilla, increased in size compared to previous exams, again compatible with progression of disease. Electronically Signed   By: Franki Cabot M.D.   On: 03/14/2018 14:02     Medical Consultants:    None.  Anti-Infectives:   None  Subjective:    [name] he relates his shortness of breath is slightly better than yesterday but he continues to be short of breath sitting down.  Objective:    Vitals:   03/14/18 0532 03/14/18 1301 03/14/18 2033 03/15/18 0644  BP: 114/75  (!) 84/60 103/62  Pulse: (!) 106  (!) 124 (!) 106  Resp: 19  18  18  Temp: 98 F (36.7 C)  98.7 F (37.1 C)   TempSrc: Oral  Tympanic   SpO2: 92%  98% 93%  Weight:  75.5 kg (166 lb 7.2 oz)    Height:        Intake/Output Summary (Last 24 hours) at 03/15/2018 1038 Last data filed at 03/14/2018 1541 Gross per 24 hour  Intake 1667.17 ml  Output 1050 ml  Net 617.17 ml   Filed Weights   02/15/2018 2055 03/14/18 1301  Weight: 82.7 kg (182 lb 5.1 oz) 75.5 kg (166 lb 7.2 oz)    Exam: General exam: In no acute distress,  cachectic Respiratory system: Good air movement and clear to auscultation. Cardiovascular system: S1 & S2 heard, RRR.  Gastrointestinal system: Abdomen is nondistended, soft and nontender.  Central nervous system: Alert and oriented. No focal neurological deficits. Extremities: No pedal edema. Skin: No rashes, lesions or ulcers Psychiatry: Judgement and insight appear normal. Mood & affect appropriate.    Data Reviewed:    Labs: Basic Metabolic Panel: Recent Labs  Lab 03/09/18 0437 03/12/18 0344 03/14/18 0417 03/15/18 0347  NA 137 136 134* 134*  K 4.1 4.9 5.8* 4.6  CL 99* 97* 97* 95*  CO2 26 28 22 24   GLUCOSE 133* 166* 202* 165*  BUN 15 21* 26* 30*  CREATININE 0.84 0.89 1.30* 1.31*  CALCIUM 8.9 9.1 8.4* 8.4*  PHOS 2.8  --   --   --    GFR Estimated Creatinine Clearance: 57 mL/min (A) (by C-G formula based on SCr of 1.31 mg/dL (H)). Liver Function Tests: Recent Labs  Lab 03/09/18 0437 03/15/18 0347  AST  --  19  ALT  --  14*  ALKPHOS  --  69  BILITOT  --  1.2  PROT  --  4.9*  ALBUMIN 3.2* 2.4*   No results for input(s): LIPASE, AMYLASE in the last 168 hours. No results for input(s): AMMONIA in the last 168 hours. Coagulation profile Recent Labs  Lab 03/14/18 1512  INR 1.14    CBC: Recent Labs  Lab 03/09/18 0437 03/12/18 0344 03/14/18 0417 03/14/18 1512 03/15/18 0347  WBC 7.7 13.8* 22.6*  --  18.7*  NEUTROABS 6.9 11.7* 20.6*  --   --   HGB 12.5* 12.4* 13.0  --  12.8*  HCT 40.4 38.7* 39.8  --  38.6*  MCV 92.2 88.8 88.1  --  86.9  PLT 172 192 212 195 189   Cardiac Enzymes: No results for input(s): CKTOTAL, CKMB, CKMBINDEX, TROPONINI in the last 168 hours. BNP (last 3 results) No results for input(s): PROBNP in the last 8760 hours. CBG: No results for input(s): GLUCAP in the last 168 hours. D-Dimer: Recent Labs    03/14/18 1512  DDIMER 2.60*   Hgb A1c: No results for input(s): HGBA1C in the last 72 hours. Lipid Profile: No results for  input(s): CHOL, HDL, LDLCALC, TRIG, CHOLHDL, LDLDIRECT in the last 72 hours. Thyroid function studies: No results for input(s): TSH, T4TOTAL, T3FREE, THYROIDAB in the last 72 hours.  Invalid input(s): FREET3 Anemia work up: No results for input(s): VITAMINB12, FOLATE, FERRITIN, TIBC, IRON, RETICCTPCT in the last 72 hours. Sepsis Labs: Recent Labs  Lab 03/09/18 0437 03/12/18 0344 03/14/18 0417 03/14/18 1513 03/15/18 0347  PROCALCITON  --   --   --  20.39  --   WBC 7.7 13.8* 22.6*  --  18.7*   Microbiology No results found for this or any previous visit (from the past 240  hour(s)).   Medications:   . chlorhexidine  15 mL Mouth Rinse BID  . enoxaparin (LOVENOX) injection  40 mg Subcutaneous Q24H  . feeding supplement (ENSURE ENLIVE)  237 mL Oral Q24H  . mouth rinse  15 mL Mouth Rinse q12n4p  . mometasone   Topical Daily  . multivitamin with minerals  1 tablet Oral Daily  . polyethylene glycol  17 g Oral Daily  . protein supplement shake  11 oz Oral BID BM  . senna-docusate  1 tablet Oral BID   Continuous Infusions: . piperacillin-tazobactam (ZOSYN)  IV Stopped (03/15/18 0130)  . vancomycin        LOS: 8 days   [facility] Pager [phone]  *Please refer to [email or web address], password [email or web address] to get updated schedule on who will round on this patient, as hospitalists switch teams weekly. If 7PM-7AM, please contact night-coverage at [email or web address], password [email or web address] for any overnight needs.  03/15/2018, 10:38 AM

## 2018-03-16 LAB — BASIC METABOLIC PANEL
Anion gap: 9 (ref 5–15)
BUN: 27 mg/dL — ABNORMAL HIGH (ref 6–20)
CALCIUM: 8 mg/dL — AB (ref 8.9–10.3)
CO2: 27 mmol/L (ref 22–32)
CREATININE: 1.09 mg/dL (ref 0.61–1.24)
Chloride: 94 mmol/L — ABNORMAL LOW (ref 101–111)
GFR calc Af Amer: >60 mL/min (ref >60)
GFR calc non Af Amer: >60 mL/min (ref >60)
GLUCOSE: 198 mg/dL — AB (ref 65–99)
Potassium: 4.5 mmol/L (ref 3.5–5.1)
Sodium: 130 mmol/L — ABNORMAL LOW (ref 135–145)

## 2018-03-16 LAB — GRAM STAIN

## 2018-03-16 LAB — CBC
HCT: 34.8 % — ABNORMAL LOW (ref 39.0–52.0)
Hemoglobin: 11.8 g/dL — ABNORMAL LOW (ref 13.0–17.0)
MCH: 29.3 pg (ref 26.0–34.0)
MCHC: 33.9 g/dL (ref 30.0–36.0)
MCV: 86.4 fL (ref 78.0–100.0)
PLATELETS: 153 10*3/uL (ref 150–400)
RBC: 4.03 MIL/uL — ABNORMAL LOW (ref 4.22–5.81)
RDW: 15.7 % — AB (ref 11.5–15.5)
WBC: 15.1 10*3/uL — ABNORMAL HIGH (ref 4.0–10.5)

## 2018-03-16 LAB — PHOSPHORUS: Phosphorus: 2.8 mg/dL (ref 2.5–4.6)

## 2018-03-16 LAB — LACTATE DEHYDROGENASE: LDH: 487 U/L — ABNORMAL HIGH (ref 98–192)

## 2018-03-16 MED ORDER — FLUCONAZOLE 100MG IVPB
100.0000 mg | INTRAVENOUS | Status: DC
  Administered 2018-03-16 – 2018-03-17 (×2): 100 mg via INTRAVENOUS
  Filled 2018-03-16 (×2): qty 50

## 2018-03-16 NOTE — Plan of Care (Signed)
  Problem: Education: Goal: Knowledge of General Education information will improve Outcome: Progressing   Problem: Health Behavior/Discharge Planning: Goal: Ability to manage health-related needs will improve Outcome: Progressing   Problem: Elimination: Goal: Will not experience complications related to urinary retention Outcome: Progressing   Problem: Skin Integrity: Goal: Risk for impaired skin integrity will decrease Outcome: Progressing

## 2018-03-16 NOTE — Progress Notes (Signed)
[facility] PROGRESS NOTE    Progress Note  [name]  SAY:[id number] DOB: September 24, 1956 DOA: 02/22/2018 PCP: Patient, Kristopher Johnson     Brief Narrative:   Kristopher Johnson is an 62 y.o. 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  Radiation oncology was consulted and recommended 2 weeks of elective radiation.  I taped care was consulted and patient and would like to continue aggressive treatment  Assessment/Plan:   Mantle cell lymphoma of lymph nodes of multiple regions [name]: Oncology was consulted and recommended continuing ibrutinib, rituxan q3 weeks. Holding venetoclax. LDH and phosphorus are improving indicating good response. Palliative care saw the patient and the patient would like to continue with aggressive treatments, they did confirm the DNR. Continue Norco for pain. Oncology was consulted and recommended radiation for 2 weeks beginning on 03/13/2018 in hopes of reducing the effusion.  Recurrent malignant pleural effusion, left Continue drainage of fluid twice a day Use IV albumin as needed for hypertension.  Acute kidney injury With 1 L output through Pleurx catheter and probably decrease oral intake. Improved with IV fluid hydration with increasing continue to push oral fluids.  Leukocytosis: Kristopher fever Kristopher change in infiltrate. He was started empirically IV vancomycin he has been pancultured. Culture data from fluid is pending. Cytosis is slowly improving.  Orthostatic hypotension: Likely due to decreased oral intake and large amount of output through the Pleurx  catheter continue push oral hydration and IV IV albumin as needed.  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  Check a phosphorus level.  DVT prophylaxis: lovexno Family Communication:none Disposition Plan/Barrier to D/C: unable to determine Code Status:     Code Status Orders  (From admission, onward)        Start     Ordered   02/18/2018 2328  Do not attempt resuscitation (DNR)  Continuous    Question Answer Comment  In the event of cardiac or respiratory ARREST Do not call a "code blue"   In the event of cardiac or respiratory ARREST Do not perform Intubation, CPR, defibrillation or ACLS   In the event of cardiac or respiratory ARREST Use medication by any route, position, wound care, and other measures to relive pain and suffering. May use oxygen, suction and manual treatment of airway obstruction as needed for comfort.      02/17/2018 2327    Code Status History    Date Active Date Inactive Code Status Order ID Comments User Context   02/17/2018 2116 02/14/2018 2327 Full Code [id number]  Kristopher Roys, MD Inpatient   02/19/2018 1948 03/03/2018 2116 DNR [id number]  Kristopher Roys, MD ED   02/03/2018 1937 02/04/2018 1553 DNR [id number]  Kristopher Jansky, MD ED   01/27/2018 1832 01/30/2018 1603 Full Code [id number]  Kristopher Dumas, MD Inpatient   10/25/2017 1628 11/01/2017 1610 Full Code [id number]  Kristopher Molder, MD Inpatient   03/01/2017 0123 03/02/2017 1336 Full Code [id number]  Kristopher Patience, MD ED   10/24/2015 2317 10/25/2015 2051 Full Code [id number]  Kristopher Morton, MD Inpatient  IV Access:    Peripheral IV   Procedures and diagnostic studies:   Ct Chest Wo Contrast  Result Date: 03/14/2018 CLINICAL DATA:  Respiratory illness. Johnson previous reports, patient has a known history of stage IV lymphoma with recurrence of a malignant effusion. EXAM: CT CHEST WITHOUT CONTRAST TECHNIQUE:  Multidetector CT imaging of the chest was performed following the standard protocol without IV contrast. COMPARISON:  Chest CT angiogram dated 03/09/2018. chest CT dated 03/05/2018. Chest CT dated 01/28/2018. FINDINGS: Cardiovascular: Heart size is normal. Heart is displaced to the RIGHT. Kristopher pericardial effusion seen. Kristopher thoracic aortic aneurysm seen. Mediastinum/Nodes: There is extensive mediastinal lymphadenopathy, not significantly changed compared to previous CTs of 03/09/2018 and 03/05/2018, but significantly progressed compared to chest CT of 01/28/2018 Lungs/Pleura: Loculated appearing LEFT pleural effusion is decreased in size compared to most recent chest CT of 03/09/2018, chest tube in place with tip directed towards the lateral aspects of the LEFT upper lung. Multifocal masslike nodularity along the pleura throughout the LEFT chest. Most prominent component of this pleural masslike nodularity is again seen at the anterior aspects of the LEFT upper lung, measuring approximately 6.3 cm transverse dimension and 6.2 cm AP dimension (series 2, images 44 through 48), measuring only 2.5 cm on chest CT of 01/28/2018, also slightly increased in size compared to the most recent chest CT of 03/09/2018 where it measured approximately 5.8 x 5.7 cm. The mass effect on the mediastinum, with rightward mediastinal shift, is not significantly changed compared to the most recent chest CT of 03/09/2018. Small peripheral ground-glass opacity within the lateral aspects of the RIGHT upper lobe, pneumonia versus edema. RIGHT lung otherwise clear. Kristopher pneumothorax seen. Upper Abdomen: Characterization of the upper abdomen is limited by patient motion artifact. Musculoskeletal: Kristopher acute or suspicious osseous finding. Additional prominent lymph nodes in the LEFT axilla, significantly increased in size compared to previous chest CT of 01/28/2018, largest now with short axis measurement of approximately 2.2 cm (previously 1 cm).  IMPRESSION: 1. Significant progression of disease compared to chest CT of 01/28/2018, and least mild progression of disease compared to the more recent chest CT of 03/09/2018, as detailed above. 2. LEFT pleural effusion, moderate to large in size, likely multiloculated, with chest tube in place, slightly decreased in size compared to most recent chest CT of 03/09/2018. 3. The mass effect on the mediastinum, with associated rightward mediastinal shift, is not significantly changed compared to the most recent chest CT. 4. Small peripheral ground-glass opacity within the lateral aspects of the RIGHT upper lobe, edema versus early pneumonia, favor edema or atelectasis. RIGHT lung is otherwise clear. 5. Enlarged lymph nodes in the LEFT axilla, increased in size compared to previous exams, again compatible with progression of disease. Electronically Signed   By: Franki Cabot M.D.   On: 03/14/2018 14:02     Medical Consultants:    None.  Anti-Infectives:   None  Subjective:    [name]. Shortness of breath is better and he feels better than yesterday.  Objective:    Vitals:   03/15/18 1333 03/15/18 2033 03/16/18 0446 03/16/18 0447  BP: 99/61 (!) 111/58 (!) 112/96   Pulse: (!) 102 (!) 104 92   Resp: 14 15 16    Temp: 97.7 F (36.5 C) 98.1 F (36.7 C) 98.6 F (37 C)   TempSrc: Oral Oral Oral   SpO2: 92% 92% (!) 88% 92%  Weight:      Height:        Intake/Output  Summary (Last 24 hours) at 03/16/2018 0933 Last data filed at 03/16/2018 0600 Gross Johnson 24 hour  Intake 3210 ml  Output 2600 ml  Net 610 ml   Filed Weights   02/28/2018 2055 03/14/18 1301  Weight: 82.7 kg (182 lb 5.1 oz) 75.5 kg (166 lb 7.2 oz)    Exam: General exam: In Kristopher acute distress, cachectic Respiratory system: Good air movement and clear to auscultation. Cardiovascular system: S1 & S2 heard, RRR.  Gastrointestinal system: Abdomen is nondistended, soft and nontender.  Central nervous system: Alert and  oriented. Kristopher focal neurological deficits. Extremities: Kristopher pedal edema. Skin: Kristopher rashes, lesions or ulcers Psychiatry: Judgement and insight appear normal. Mood & affect appropriate.    Data Reviewed:    Labs: Basic Metabolic Panel: Recent Labs  Lab 03/12/18 0344 03/14/18 0417 03/15/18 0347 03/16/18 0424  NA 136 134* 134* 130*  K 4.9 5.8* 4.6 4.5  CL 97* 97* 95* 94*  CO2 28 22 24 27   GLUCOSE 166* 202* 165* 198*  BUN 21* 26* 30* 27*  CREATININE 0.89 1.30* 1.31* 1.09  CALCIUM 9.1 8.4* 8.4* 8.0*  PHOS  --   --   --  2.8   GFR Estimated Creatinine Clearance: 68.5 mL/min (by C-G formula based on SCr of 1.09 mg/dL). Liver Function Tests: Recent Labs  Lab 03/15/18 0347  AST 19  ALT 14*  ALKPHOS 69  BILITOT 1.2  PROT 4.9*  ALBUMIN 2.4*   Kristopher results for input(s): LIPASE, AMYLASE in the last 168 hours. Kristopher results for input(s): AMMONIA in the last 168 hours. Coagulation profile Recent Labs  Lab 03/14/18 1512  INR 1.14    CBC: Recent Labs  Lab 03/12/18 0344 03/14/18 0417 03/14/18 1512 03/15/18 0347 03/16/18 0424  WBC 13.8* 22.6*  --  18.7* 15.1*  NEUTROABS 11.7* 20.6*  --   --   --   HGB 12.4* 13.0  --  12.8* 11.8*  HCT 38.7* 39.8  --  38.6* 34.8*  MCV 88.8 88.1  --  86.9 86.4  PLT 192 212 195 189 153   Cardiac Enzymes: Kristopher results for input(s): CKTOTAL, CKMB, CKMBINDEX, TROPONINI in the last 168 hours. BNP (last 3 results) Kristopher results for input(s): PROBNP in the last 8760 hours. CBG: Kristopher results for input(s): GLUCAP in the last 168 hours. D-Dimer: Recent Labs    03/14/18 1512  DDIMER 2.60*   Hgb A1c: Kristopher results for input(s): HGBA1C in the last 72 hours. Lipid Profile: Kristopher results for input(s): CHOL, HDL, LDLCALC, TRIG, CHOLHDL, LDLDIRECT in the last 72 hours. Thyroid function studies: Kristopher results for input(s): TSH, T4TOTAL, T3FREE, THYROIDAB in the last 72 hours.  Invalid input(s): FREET3 Anemia work up: Kristopher results for input(s): VITAMINB12, FOLATE,  FERRITIN, TIBC, IRON, RETICCTPCT in the last 72 hours. Sepsis Labs: Recent Labs  Lab 03/12/18 0344 03/14/18 0417 03/14/18 1513 03/15/18 0347 03/16/18 0424  PROCALCITON  --   --  20.39  --   --   WBC 13.8* 22.6*  --  18.7* 15.1*   Microbiology Recent Results (from the past 240 hour(s))  Culture, blood (Routine X 2) w Reflex to ID Panel     Status: None (Preliminary result)   Collection Time: 03/14/18  3:12 PM  Result Value Ref Range Status   Specimen Description   Final    BLOOD LEFT ANTECUBITAL Performed at [facility], [place] [address] [place], [place] [address]    Special Requests   Final  BOTTLES DRAWN AEROBIC ONLY Blood Culture adequate volume Performed at [place] [address] [place], [place] [address]    Culture   Final    Kristopher GROWTH < 24 HOURS Performed at [facility] [address], [place], [place] [address]    Report Status PENDING  Incomplete  Culture, blood (Routine X 2) w Reflex to ID Panel     Status: None (Preliminary result)   Collection Time: 03/14/18  3:13 PM  Result Value Ref Range Status   Specimen Description   Final    BLOOD RIGHT ANTECUBITAL Performed at [facility] [address] [place], [place] [address]    Special Requests   Final    BOTTLES DRAWN AEROBIC ONLY Blood Culture adequate volume Performed at [facility] [address], [place], [place] [address]    Culture   Final    Kristopher GROWTH < 24 HOURS Performed at [facility] [address] [place], [place] [address]    Report Status PENDING  Incomplete  Culture, body fluid-bottle     Status: None (Preliminary result)   Collection Time: 03/15/18 10:52 AM  Result Value Ref Range Status   Specimen Description FLUID LEFT PLEURAL  Final   Special Requests BOTTLES DRAWN AEROBIC AND ANAEROBIC  Final   Culture PENDING  Incomplete   Report Status PENDING  Incomplete  Gram stain     Status:  None   Collection Time: 03/15/18 10:52 AM  Result Value Ref Range Status   Specimen Description FLUID LEFT PLEURAL  Final   Special Requests NONE  Final   Gram Stain   Final    MODERATE WBC PRESENT, PREDOMINANTLY MONONUCLEAR Kristopher ORGANISMS SEEN Performed at [facility], [place] [address] [place], [place] [address]    Report Status 03/16/2018 FINAL  Final     Medications:   . chlorhexidine  15 mL Mouth Rinse BID  . enoxaparin (LOVENOX) injection  40 mg Subcutaneous Q24H  . feeding supplement (ENSURE ENLIVE)  237 mL Oral Q24H  . mouth rinse  15 mL Mouth Rinse q12n4p  . mometasone   Topical Daily  . multivitamin with minerals  1 tablet Oral Daily  . polyethylene glycol  17 g Oral Daily  . protein supplement shake  11 oz Oral BID BM  . senna-docusate  1 tablet Oral BID   Continuous Infusions: . sodium chloride 75 mL/hr (03/15/18 2024)  . piperacillin-tazobactam (ZOSYN)  IV 3.375 g (03/16/18 0700)  . vancomycin Stopped (03/16/18 0227)      LOS: 9 days   Charlynne Cousins  [facility] Pager [phone]  *Please refer to [email or web address], password [email or web address] to get updated schedule on who will round on this patient, as hospitalists switch teams weekly. If 7PM-7AM, please contact night-coverage at [email or web address], password [email or web address] for any overnight needs.  03/16/2018, 9:33 AM

## 2018-03-17 ENCOUNTER — Ambulatory Visit
Admit: 2018-03-17 | Discharge: 2018-03-17 | Disposition: A | Discharge status: Home / Self Care | Payer: Medicare Other | Attending: Radiation Oncology | Admitting: Radiation Oncology

## 2018-03-17 LAB — BASIC METABOLIC PANEL
Anion gap: 8 (ref 5–15)
BUN: 21 mg/dL — AB (ref 6–20)
CHLORIDE: 100 mmol/L — AB (ref 101–111)
CO2: 26 mmol/L (ref 22–32)
CREATININE: 0.84 mg/dL (ref 0.61–1.24)
Calcium: 8 mg/dL — ABNORMAL LOW (ref 8.9–10.3)
GFR calc Af Amer: >60 mL/min (ref >60)
GFR calc non Af Amer: >60 mL/min (ref >60)
GLUCOSE: 164 mg/dL — AB (ref 65–99)
Potassium: 4.6 mmol/L (ref 3.5–5.1)
Sodium: 134 mmol/L — ABNORMAL LOW (ref 135–145)

## 2018-03-17 LAB — MRSA PCR SCREENING: MRSA by PCR: NEGATIVE

## 2018-03-17 MED ORDER — IPRATROPIUM-ALBUTEROL 0.5-2.5 (3) MG/3ML IN SOLN
3.0000 mL | Freq: Four times a day (QID) | RESPIRATORY_TRACT | Status: DC | PRN
  Administered 2018-03-17 – 2018-03-20 (×3): 3 mL via RESPIRATORY_TRACT
  Filled 2018-03-17 (×3): qty 3

## 2018-03-17 MED ORDER — SODIUM CHLORIDE 0.9 % IV BOLUS
500.0000 mL | Freq: Once | INTRAVENOUS | Status: AC
  Administered 2018-03-17: 500 mL via INTRAVENOUS

## 2018-03-17 MED ORDER — IPRATROPIUM-ALBUTEROL 0.5-2.5 (3) MG/3ML IN SOLN
RESPIRATORY_TRACT | Status: AC
  Administered 2018-03-17: 3 mL via RESPIRATORY_TRACT
  Filled 2018-03-17: qty 3

## 2018-03-17 NOTE — Progress Notes (Addendum)
Pt incontinent of urine x1. Tech in to clean pt and change sheets. Pt states breathing is a better after respiratory treatment received.

## 2018-03-17 NOTE — Progress Notes (Signed)
[facility] PROGRESS NOTE    Progress Note  Kristopher Johnson  RXV:[id number] DOB: 11-13-1955 DOA: 02/22/2018 PCP: Patient, No Pcp Per     Brief Narrative:   Kristopher Johnson is an 62 y.o. 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  Radiation oncology was consulted and recommended 2 weeks of elective radiation.  We consulted hospice and palliative care and patient and would like to continue aggressive treatment.  The patient is now developing a sepsis-like picture he was started empirically on IV vancomycin and cefepime and his picture seems to be improving, his remained afebrile his leukocytosis is improving he relates he feels much better. He is also having his Pleurx catheter drain twice a day he is draining over 2 L he is going for radiation today to see if it can decrease the amount of output he is having.  He was tried on oral hydration and he was not able to keep up with the amount of fluid he needed to ingest, he was started on IV fluids his acute renal failure has improved his hypotension and tachycardia have also improved.  Assessment/Plan:   Mantle cell lymphoma of lymph nodes of multiple regions [facility]): Oncology was consulted and recommended continuing ibrutinib, rituxan q3 weeks. Holding venetoclax. LDH and phosphorus are improving indicating good response. Palliative care saw the patient and the patient would like to continue with aggressive treatments, they did confirm the DNR. Continue Norco for pain. Oncology was consulted and recommended radiation for 2 weeks beginning on  03/13/2018 in hopes of reducing the effusion. He could not keep up with the amount of fluid intake he needs.  Recurrent malignant pleural effusion, left Draining twice a day, sent for cultures which are pending. He relates his breathing is much better. Continues to have significant amount of drainage output over 2 L a day  Acute kidney injury Over 2 L output through Pleurx catheter and probably decrease oral intake. Prerenal in etiology, resolved with aggressive IV fluid and oral hydration. Continue daily weights.  Leukocytosis: No fever no change in infiltrate. He was started empirically IV vancomycin he has been pancultured. Culture data from fluid is pending. Leukocytosis continues to improve slowly. Get MRSA PCR screening.  Orthostatic hypotension: Likely due to decreased oral intake and large amount of output through the Pleurx catheter. Now improved. He is putting large amounts through his Pleurx catheter more than 2 L a day and we are replenishing it IV and orally, which has improved his tachycardia and his hypotension.  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 Phosphorus is 2.8.  Dysphagia: Probably has oral thrush on physical exam, will start on IV Diflucan we will continue to evaluate.  DVT prophylaxis: lovexno Family Communication:none Disposition Plan/Barrier to D/C: unable to determine Code Status:     Code Status Orders  (From admission, onward)        Start     Ordered   02/17/2018 2328  Do not attempt resuscitation (DNR)  Continuous    Question Answer Comment  In the event of cardiac or respiratory ARREST Do not call a "code blue"  In the event of cardiac or respiratory ARREST Do not perform Intubation, CPR, defibrillation or ACLS   In the event of cardiac or respiratory ARREST Use medication by any route, position, wound care, and other measures to relive pain and suffering. May use oxygen, suction and  manual treatment of airway obstruction as needed for comfort.      02/06/2018 2327    Code Status History    Date Active Date Inactive Code Status Order ID Comments User Context   03/07/2018 2116 02/16/2018 2327 Full Code [id number]  [name], MD Inpatient   02/11/2018 1948 02/16/2018 2116 DNR [id number]  [name], MD ED   02/03/2018 1937 02/04/2018 1553 DNR [id number]  [name], MD ED   01/27/2018 1832 01/30/2018 1603 Full Code [id number]  [name], MD Inpatient   10/25/2017 1628 11/01/2017 1610 Full Code [id number]  [name], MD Inpatient   03/01/2017 0123 03/02/2017 1336 Full Code [id number]  [name], MD ED   10/24/2015 2317 10/25/2015 2051 Full Code [id number]  [name], MD Inpatient        IV Access:    Peripheral IV   Procedures and diagnostic studies:   No results found.   Medical Consultants:    None.  Anti-Infectives:   None  Subjective:    Kristopher Johnson. Shortness of breath is better and he feels better than yesterday.  Objective:    Vitals:   03/16/18 0447 03/16/18 1339 03/16/18 2127 03/17/18 0528  BP:  102/60 101/67 (!) 104/59  Pulse:  98 (!) 102 97  Resp:  14 19 19   Temp:  97.8 F (36.6 C) 97.6 F (36.4 C) (!) 97.5 F (36.4 C)  TempSrc:  Oral Oral Oral  SpO2: 92% 90% 92% 91%  Weight:      Height:        Intake/Output Summary (Last 24 hours) at 03/17/2018 0813 Last data filed at 03/17/2018 0600 Gross per 24 hour  Intake 2790 ml  Output 2050 ml  Net 740 ml   Filed Weights   03/02/2018 2055 03/14/18 1301  Weight: 82.7 kg (182 lb 5.1 oz) 75.5 kg (166 lb 7.2 oz)    Exam: General exam: In no acute distress, cachectic Respiratory system: Good air movement and clear to auscultation. Cardiovascular system: S1 & S2 heard, RRR.  Gastrointestinal system: Abdomen is nondistended, soft and nontender.  Central nervous system: Alert and oriented. No focal neurological deficits. Extremities: No  pedal edema. Skin: No rashes, lesions or ulcers Psychiatry: Judgement and insight appear normal. Mood & affect appropriate.    Data Reviewed:    Labs: Basic Metabolic Panel: Recent Labs  Lab 03/12/18 0344 03/14/18 0417 03/15/18 0347 03/16/18 0424 03/17/18 0341  NA 136 134* 134* 130* 134*  K 4.9 5.8* 4.6 4.5 4.6  CL 97* 97* 95* 94* 100*  CO2 28 22 24 27 26   GLUCOSE 166* 202* 165* 198* 164*  BUN 21* 26* 30* 27* 21*  CREATININE 0.89 1.30* 1.31* 1.09 0.84  CALCIUM 9.1 8.4* 8.4* 8.0* 8.0*  PHOS  --   --   --  2.8  --    GFR Estimated Creatinine Clearance: 88.8 mL/min (by C-G formula based on SCr of 0.84 mg/dL). Liver Function Tests: Recent Labs  Lab 03/15/18 0347  AST 19  ALT 14*  ALKPHOS 69  BILITOT 1.2  PROT 4.9*  ALBUMIN 2.4*   No results for input(s): LIPASE, AMYLASE in the last 168 hours. No results for  input(s): AMMONIA in the last 168 hours. Coagulation profile Recent Labs  Lab 03/14/18 1512  INR 1.14    CBC: Recent Labs  Lab 03/12/18 0344 03/14/18 0417 03/14/18 1512 03/15/18 0347 03/16/18 0424  WBC 13.8* 22.6*  --  18.7* 15.1*  NEUTROABS 11.7* 20.6*  --   --   --   HGB 12.4* 13.0  --  12.8* 11.8*  HCT 38.7* 39.8  --  38.6* 34.8*  MCV 88.8 88.1  --  86.9 86.4  PLT 192 212 195 189 153   Cardiac Enzymes: No results for input(s): CKTOTAL, CKMB, CKMBINDEX, TROPONINI in the last 168 hours. BNP (last 3 results) No results for input(s): PROBNP in the last 8760 hours. CBG: No results for input(s): GLUCAP in the last 168 hours. D-Dimer: Recent Labs    03/14/18 1512  DDIMER 2.60*   Hgb A1c: No results for input(s): HGBA1C in the last 72 hours. Lipid Profile: No results for input(s): CHOL, HDL, LDLCALC, TRIG, CHOLHDL, LDLDIRECT in the last 72 hours. Thyroid function studies: No results for input(s): TSH, T4TOTAL, T3FREE, THYROIDAB in the last 72 hours.  Invalid input(s): FREET3 Anemia work up: No results for input(s): VITAMINB12, FOLATE,  FERRITIN, TIBC, IRON, RETICCTPCT in the last 72 hours. Sepsis Labs: Recent Labs  Lab 03/12/18 0344 03/14/18 0417 03/14/18 1513 03/15/18 0347 03/16/18 0424  PROCALCITON  --   --  20.39  --   --   WBC 13.8* 22.6*  --  18.7* 15.1*   Microbiology Recent Results (from the past 240 hour(s))  Culture, blood (Routine X 2) w Reflex to ID Panel     Status: None (Preliminary result)   Collection Time: 03/14/18  3:12 PM  Result Value Ref Range Status   Specimen Description   Final    BLOOD LEFT ANTECUBITAL Performed at [facility], [place] [address], [place], [place] [address]    Special Requests   Final    BOTTLES DRAWN AEROBIC ONLY Blood Culture adequate volume Performed at [facility] [address], [place], [place] [address]    Culture   Final    NO GROWTH 2 DAYS Performed at [facility] [address], [place], [place] [address]    Report Status PENDING  Incomplete  Culture, blood (Routine X 2) w Reflex to ID Panel     Status: None (Preliminary result)   Collection Time: 03/14/18  3:13 PM  Result Value Ref Range Status   Specimen Description   Final    BLOOD RIGHT ANTECUBITAL Performed at [facility] [address], [place], [place] [address]    Special Requests   Final    BOTTLES DRAWN AEROBIC ONLY Blood Culture adequate volume Performed at [facility] [address] [place], [place] [address]    Culture   Final    NO GROWTH 2 DAYS Performed at [facility] [address], [place],  [address]    Report Status PENDING  Incomplete  Culture, body fluid-bottle     Status: None (Preliminary result)   Collection Time: 03/15/18 10:52 AM  Result Value Ref Range Status   Specimen Description FLUID LEFT PLEURAL  Final   Special Requests BOTTLES DRAWN AEROBIC AND ANAEROBIC  Final   Culture PENDING  Incomplete   Report Status PENDING  Incomplete  Gram stain     Status: None    Collection Time: 03/15/18 10:52 AM  Result Value Ref Range Status  Specimen Description FLUID LEFT PLEURAL  Final   Special Requests NONE  Final   Gram Stain   Final    MODERATE WBC PRESENT, PREDOMINANTLY MONONUCLEAR NO ORGANISMS SEEN Performed at [facility], [place] [address] [place], [place] [address]    Report Status 03/16/2018 FINAL  Final     Medications:   . chlorhexidine  15 mL Mouth Rinse BID  . enoxaparin (LOVENOX) injection  40 mg Subcutaneous Q24H  . feeding supplement (ENSURE ENLIVE)  237 mL Oral Q24H  . mouth rinse  15 mL Mouth Rinse q12n4p  . mometasone   Topical Daily  . multivitamin with minerals  1 tablet Oral Daily  . polyethylene glycol  17 g Oral Daily  . protein supplement shake  11 oz Oral BID BM  . senna-docusate  1 tablet Oral BID   Continuous Infusions: . sodium chloride 100 mL/hr at 03/16/18 2121  . fluconazole (DIFLUCAN) IV 100 mg (03/16/18 1700)  . piperacillin-tazobactam (ZOSYN)  IV 3.375 g (03/17/18 0526)  . vancomycin Stopped (03/16/18 2251)      LOS: 10 days   Charlynne Cousins  [facility] Pager [phone]  *Please refer to [email or web address], password [email or web address] to get updated schedule on who will round on this patient, as hospitalists switch teams weekly. If 7PM-7AM, please contact night-coverage at [email or web address], password [email or web address] for any overnight needs.  03/17/2018, 8:13 AM

## 2018-03-17 NOTE — Progress Notes (Signed)
Pharmacy Antibiotic Note  Kristopher Johnson is a 62 y.o. male admitted on 02/25/2018 with recurrent mantle cell lymphoma and worsening malignant pleural effusions. On 6/7, patient noted to have progressive neutrophilia concerning for sepsis vs SIRS from TLS. Pharmacy has been consulted for vancomycin and Zosyn dosing to r/o sepsis.  03/17/2018  Scr down to WNL (0.84), WBC down to 15.1 yesterday, AF. L pleural fluid gram stain negative, culture still pending.  Blood cultures no growth x 2 days. Unclear etiology of hypotension and elevated WBC, but improving. MRSA PCR ordered today.   Plan:  Vancomycin 1000 + 750 mg IV given 6/7, then 1250 mg IV q24 hr (est AUC 488 based on SCr 1.3)  Measure vancomycin AUC at steady state as indicated  Zosyn 3.375 g IV given once over 30 minutes, then every 8 hrs by 4-hr infusion  Daily SCr while on Vanc + Zosyn d/t risk of nephrotoxicity  Height: 5\' 9"  (175.3 cm) Weight: 166 lb 7.2 oz (75.5 kg) IBW/kg (Calculated) : 70.7  Temp (24hrs), Avg:97.6 F (36.4 C), Min:97.5 F (36.4 C), Max:97.8 F (36.6 C)  Recent Labs  Lab 03/12/18 0344 03/14/18 0417 03/15/18 0347 03/16/18 0424 03/17/18 0341  WBC 13.8* 22.6* 18.7* 15.1*  --   CREATININE 0.89 1.30* 1.31* 1.09 0.84    Estimated Creatinine Clearance: 88.8 mL/min (by C-G formula based on SCr of 0.84 mg/dL).    No Known Allergies  Antimicrobials this admission: 6/7 vancomycin >>  6/7 Zosyn >>  6/9 fluconazole (thrush)>>  Dose adjustments this admission: n/a  Microbiology results: 6/10 MRSA PCR > sent 6/8 L pleural fluid> pending 6/8 L pleural fluid> gram stain- no organisms seen F 6/7 BCx2>>ngtd  Thank you for allowing pharmacy to be a part of this patient's care.  Eudelia Bunch, Pharm.D. [phone] 03/17/2018 11:20 AM

## 2018-03-17 NOTE — Progress Notes (Signed)
PT Cancellation Note  Patient Details Name: COSTON MANDATO MRN: [id number] DOB: 1956/08/10   Cancelled Treatment:    Reason Eval/Treat Not Completed: Attempted PT tx session. Pt declined to participate on today. Will check back another day.    Weston Anna, MPT Pager: [phone]

## 2018-03-17 NOTE — Progress Notes (Signed)
Paged Dr. Baltazar Najjar to inform about patients recent vitals.

## 2018-03-17 NOTE — Progress Notes (Signed)
OT Cancellation Note  Patient Details Name: Kristopher Johnson MRN: [id number] DOB: [birth year]   Cancelled Treatment:    Reason Eval/Treat Not Completed: Patient declined Will check back on pt next day. [name], [place]  Payton Mccallum D 03/17/2018, 3:45 PM

## 2018-03-17 NOTE — Care Management Important Message (Signed)
Important Message  Patient Details  Name: Kristopher Johnson MRN: [id number] Date of Birth: 08-04-1956   Medicare Important Message Given:  Yes    Kerin Salen 03/17/2018, 2:36 [place] Message  Patient Details  Name: Kristopher Johnson MRN: [id number] Date of Birth: [birth year]   Medicare Important Message Given:  Yes    Kerin Salen 03/17/2018, 2:36 PM

## 2018-03-17 NOTE — Plan of Care (Signed)
  Problem: Education: Goal: Knowledge of General Education information will improve Outcome: Progressing   Problem: Health Behavior/Discharge Planning: Goal: Ability to manage health-related needs will improve Outcome: Progressing   Problem: Nutrition: Goal: Adequate nutrition will be maintained Outcome: Progressing   Problem: Elimination: Goal: Will not experience complications related to bowel motility Outcome: Progressing Goal: Will not experience complications related to urinary retention Outcome: Progressing   Problem: Skin Integrity: Goal: Risk for impaired skin integrity will decrease Outcome: Progressing

## 2018-03-18 ENCOUNTER — Inpatient Hospital Stay (HOSPITAL_COMMUNITY): Payer: Medicare Other

## 2018-03-18 ENCOUNTER — Ambulatory Visit
Admit: 2018-03-18 | Discharge: 2018-03-18 | Disposition: A | Discharge status: Home / Self Care | Payer: Medicare Other | Attending: Radiation Oncology | Admitting: Radiation Oncology

## 2018-03-18 LAB — BASIC METABOLIC PANEL
Anion gap: 12 (ref 5–15)
Anion gap: 11 (ref 5–15)
BUN: 22 mg/dL — AB (ref 6–20)
BUN: 21 mg/dL — AB (ref 6–20)
CALCIUM: 8.1 mg/dL — AB (ref 8.9–10.3)
CALCIUM: 8.1 mg/dL — AB (ref 8.9–10.3)
CO2: 20 mmol/L — ABNORMAL LOW (ref 22–32)
CO2: 21 mmol/L — AB (ref 22–32)
CREATININE: 1.04 mg/dL (ref 0.61–1.24)
Chloride: 100 mmol/L — ABNORMAL LOW (ref 101–111)
Chloride: 101 mmol/L (ref 101–111)
Creatinine, Ser: 0.92 mg/dL (ref 0.61–1.24)
GFR calc Af Amer: >60 mL/min (ref >60)
Glucose, Bld: 178 mg/dL — ABNORMAL HIGH (ref 65–99)
Glucose, Bld: 161 mg/dL — ABNORMAL HIGH (ref 65–99)
Potassium: 5.8 mmol/L — ABNORMAL HIGH (ref 3.5–5.1)
Potassium: 4.6 mmol/L (ref 3.5–5.1)
SODIUM: 132 mmol/L — AB (ref 135–145)
Sodium: 133 mmol/L — ABNORMAL LOW (ref 135–145)

## 2018-03-18 LAB — PHOSPHORUS: PHOSPHORUS: 3.7 mg/dL (ref 2.5–4.6)

## 2018-03-18 LAB — LACTATE DEHYDROGENASE: LDH: 403 U/L — AB (ref 98–192)

## 2018-03-18 MED ORDER — FLUCONAZOLE 100 MG PO TABS
100.0000 mg | ORAL_TABLET | Freq: Every day | ORAL | Status: DC
  Administered 2018-03-18 – 2018-03-25 (×5): 100 mg via ORAL
  Filled 2018-03-18 (×8): qty 1

## 2018-03-18 MED ORDER — SODIUM POLYSTYRENE SULFONATE 15 GM/60ML PO SUSP
30.0000 g | Freq: Once | ORAL | Status: AC
  Administered 2018-03-18: 30 g via ORAL
  Filled 2018-03-18: qty 120

## 2018-03-18 NOTE — Progress Notes (Signed)
[facility] Therapy Treatment Record Phone [phone]   Radiation Therapy was administered to Kristopher Johnson on: 03/18/2018  2:07 PM and was treatment # 4 out of a planned course of 10 treatments.  Radiation Treatment  1). Beam Photons 10-19 MeV  2). Brachytherapy None  3). Stereotactic Radiosurgery None  4). Other Radiation None     Daeshon Grammatico, Mayara Paulson, RT (T)

## 2018-03-18 NOTE — Progress Notes (Signed)
Assisted Mr Rossin and family with completing advance directive.  Copy in chart.  Pt with original and copies.    [facility] / [facility] Chaplain Jerene Pitch, MDiv, [facility]

## 2018-03-18 NOTE — Progress Notes (Signed)
PHARMACIST - PHYSICIAN COMMUNICATION  CONCERNING: Antibiotic IV to Oral Route Change Policy  RECOMMENDATION: This patient is receiving fluconazole by the intravenous route.  Based on criteria approved by the Pharmacy and Therapeutics Committee, the antibiotic(s) is/are being converted to the equivalent oral dose form(s).   DESCRIPTION: These criteria include:  Patient being treated for a respiratory tract infection, urinary tract infection, cellulitis or clostridium difficile associated diarrhea if on metronidazole  The patient is not neutropenic and does not exhibit a GI malabsorption state  The patient is eating (either orally or via tube) and/or has been taking other orally administered medications for a least 24 hours  The patient is improving clinically and has a Tmax < 100.5  If you have questions about this conversion, please contact the Pharmacy Department  []   [phone] )  [facility] []   [phone] )  [facility] []   [phone] )  [facility] []   [phone] )  [facility] [x]   [phone] )  [place], [place].D. [phone] 03/18/2018 7:44 AM

## 2018-03-18 NOTE — Progress Notes (Signed)
Palliative care is following this patient as needed during this hospitalization. Goals have been established DNR, symptom management, and continued chemotherapy and/or radiation and aggressive tx for cancer if offered. He is requiring a very high level of supportive care including frequent draining of pleural effusions from pleurx and has serious visual problems. He need holistic support and if not hospice aligned yet he would benefit from [employer] referral at time of discharge. Will ask chaplains to stop by since he appears to be getting increasingly irritable and frustrated from prolonged hospitalization and his overall condition.  Lane Hacker, DO Palliative Medicine [phone]

## 2018-03-18 NOTE — Progress Notes (Signed)
Patient refused to get a bath or clean up this morning.

## 2018-03-18 NOTE — Progress Notes (Signed)
Patient stated he is SOB at this time. Oxygen level is 96% at this time on 2L of O2 [place]. Offered breathing treatment or Morphine and patient declined at this time. Patient does not appear to be in any distress. Will continue to monitor throughout the night.

## 2018-03-18 NOTE — Progress Notes (Signed)
[facility] PROGRESS NOTE    Progress Note  Kristopher Johnson  JQB:[id number] DOB: 07-31-1956 DOA: 02/20/2018 PCP: Patient, No Pcp Per     Brief Narrative:   Kristopher Johnson is an 62 y.o. 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  Radiation oncology was consulted and recommended 2 weeks of elective radiation.  We consulted hospice and palliative care and patient and would like to continue aggressive treatment.  The patient is now developing a sepsis-like picture he was started empirically on IV vancomycin and cefepime and his picture seems to be improving, his remained afebrile his leukocytosis is improving he relates he feels much better. He is also having his Pleurx catheter drain twice a day he is draining over 2 L he is going for radiation today to see if it can decrease the amount of output he is having.  He was tried on oral hydration and he was not able to keep up with the amount of fluid he needed to ingest, he was started on IV fluids his acute renal failure has improved his hypotension and tachycardia have also improved.  Assessment/Plan:   Mantle cell lymphoma of lymph nodes of multiple regions [facility]): Oncology was consulted and recommended continuing ibrutinib, rituxan q3 weeks. Holding venetoclax. Palliative care saw the patient and the patient would like to continue with aggressive treatments, they did confirm the DNR. Continue Norco for pain. Oncology was consulted and recommended radiation for 2 weeks beginning on 03/13/2018 in hopes of reducing the effusion. He could not keep  up with the amount of fluid intake he needs also IV fluids had to be started. Repeat LDH phosphorus and potassium. Patient is not improving despite medical management and chemotherapy we have reconsulted oncology for further assistance.  Recurrent malignant pleural effusion, left Draining twice a day, culture data is pending. Relate she feels worse today. Continues to have significant amount of drainage output over 2 L a day  Acute kidney injury Over 2 L output through Pleurx catheter and probably decrease oral intake. Prerenal in etiology, resolved with aggressive IV fluid and oral hydration. Continue daily weights.  Leukocytosis: No fever no change in infiltrate. He was started empirically IV vancomycin he has been pancultured. Culture data from fluid is pending. Leukocytosis continues to improve slowly. Get MRSA PCR screening.  Orthostatic hypotension: Likely due to decreased oral intake and large amount of output through the Pleurx catheter. Now improved. He is putting large amounts through his Pleurx catheter more than 2 L a day and we are replenishing it IV and orally, which has improved his tachycardia and his hypotension.  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 Phosphorus is 2.8.  Dysphagia: Dysphagia has improved continue IV Diflucan.  DVT prophylaxis: lovexno Family Communication:none Disposition Plan/Barrier to D/C: unable to determine Code Status:     Code Status Orders  (From admission, onward)        Start     Ordered   02/20/2018 2328  Do not attempt resuscitation (DNR)  Continuous    Question Answer Comment  In the event of cardiac or respiratory ARREST Do not call a "code blue"   In the event of cardiac or respiratory ARREST Do not perform Intubation, CPR, defibrillation or ACLS   In the event of cardiac or respiratory ARREST Use  medication by any route, position, wound care, and other measures to relive pain and suffering. May use oxygen, suction and manual treatment of airway obstruction as needed for comfort.      03/06/2018 2327    Code Status History    Date Active Date Inactive Code Status Order ID Comments User Context   03/05/2018 2116 02/06/2018 2327 Full Code [id number]  [name], MD Inpatient   02/24/2018 1948 02/28/2018 2116 DNR [id number]  [name], MD ED   02/03/2018 1937 02/04/2018 1553 DNR [id number]  [name], MD ED   01/27/2018 1832 01/30/2018 1603 Full Code [id number]  [name], MD Inpatient   10/25/2017 1628 11/01/2017 1610 Full Code [id number]  [name], MD Inpatient   03/01/2017 0123 03/02/2017 1336 Full Code [id number]  [name], MD ED   10/24/2015 2317 10/25/2015 2051 Full Code [id number]  [name], MD Inpatient        IV Access:    Peripheral IV   Procedures and diagnostic studies:   No results found.   Medical Consultants:    None.  Anti-Infectives:   None  Subjective:    Shortness of breath is worse today.  Objective:    Vitals:   03/18/18 0317 03/18/18 0317 03/18/18 0455 03/18/18 0700  BP:   (!) 97/57   Pulse:  (!) 112 (!) 111   Resp:   (!) 21   Temp:   (!) 97.5 F (36.4 C)   TempSrc:   Oral   SpO2: 96% 95% 95%   Weight:    72 kg (158 lb 11.7 oz)  Height:        Intake/Output Summary (Last 24 hours) at 03/18/2018 1101 Last data filed at 03/18/2018 1000 Gross per 24 hour  Intake 3083.33 ml  Output 2000 ml  Net 1083.33 ml   Filed Weights   02/19/2018 2055 03/14/18 1301 03/18/18 0700  Weight: 82.7 kg (182 lb 5.1 oz) 75.5 kg (166 lb 7.2 oz) 72 kg (158 lb 11.7 oz)    Exam: General exam: In no acute distress, cachectic Respiratory system: Good air movement and clear to auscultation. Cardiovascular system: S1 & S2 heard, RRR.  Gastrointestinal system: Abdomen is nondistended, soft and nontender.  Central  nervous system: Alert and oriented. No focal neurological deficits. Extremities: No pedal edema. Skin: No rashes, lesions or ulcers Psychiatry: Judgement and insight appear normal. Mood & affect appropriate.    Data Reviewed:    Labs: Basic Metabolic Panel: Recent Labs  Lab 03/14/18 0417 03/15/18 0347 03/16/18 0424 03/17/18 0341 03/18/18 0415 03/18/18 0915  NA 134* 134* 130* 134* 132*  --   K 5.8* 4.6 4.5 4.6 5.8*  --   CL 97* 95* 94* 100* 100*  --   CO2 22 24 27 26  20*  --   GLUCOSE 202* 165* 198* 164* 178*  --   BUN 26* 30* 27* 21* 22*  --   CREATININE 1.30* 1.31* 1.09 0.84 1.04  --   CALCIUM 8.4* 8.4* 8.0* 8.0* 8.1*  --   PHOS  --   --  2.8  --   --  3.7   GFR Estimated Creatinine Clearance: 71.8 mL/min (by C-G formula based on SCr of 1.04  mg/dL). Liver Function Tests: Recent Labs  Lab 03/15/18 0347  AST 19  ALT 14*  ALKPHOS 69  BILITOT 1.2  PROT 4.9*  ALBUMIN 2.4*   No results for input(s): LIPASE, AMYLASE in the last 168 hours. No results for input(s): AMMONIA in the last 168 hours. Coagulation profile Recent Labs  Lab 03/14/18 1512  INR 1.14    CBC: Recent Labs  Lab 03/12/18 0344 03/14/18 0417 03/14/18 1512 03/15/18 0347 03/16/18 0424  WBC 13.8* 22.6*  --  18.7* 15.1*  NEUTROABS 11.7* 20.6*  --   --   --   HGB 12.4* 13.0  --  12.8* 11.8*  HCT 38.7* 39.8  --  38.6* 34.8*  MCV 88.8 88.1  --  86.9 86.4  PLT 192 212 195 189 153   Cardiac Enzymes: No results for input(s): CKTOTAL, CKMB, CKMBINDEX, TROPONINI in the last 168 hours. BNP (last 3 results) No results for input(s): PROBNP in the last 8760 hours. CBG: No results for input(s): GLUCAP in the last 168 hours. D-Dimer: No results for input(s): DDIMER in the last 72 hours. Hgb A1c: No results for input(s): HGBA1C in the last 72 hours. Lipid Profile: No results for input(s): CHOL, HDL, LDLCALC, TRIG, CHOLHDL, LDLDIRECT in the last 72 hours. Thyroid function studies: No results for  input(s): TSH, T4TOTAL, T3FREE, THYROIDAB in the last 72 hours.  Invalid input(s): FREET3 Anemia work up: No results for input(s): VITAMINB12, FOLATE, FERRITIN, TIBC, IRON, RETICCTPCT in the last 72 hours. Sepsis Labs: Recent Labs  Lab 03/12/18 0344 03/14/18 0417 03/14/18 1513 03/15/18 0347 03/16/18 0424  PROCALCITON  --   --  20.39  --   --   WBC 13.8* 22.6*  --  18.7* 15.1*   Microbiology Recent Results (from the past 240 hour(s))  Culture, blood (Routine X 2) w Reflex to ID Panel     Status: None (Preliminary result)   Collection Time: 03/14/18  3:12 PM  Result Value Ref Range Status   Specimen Description   Final    BLOOD LEFT ANTECUBITAL Performed at [facility], [place] [address], [place], [place] [address]    Special Requests   Final    BOTTLES DRAWN AEROBIC ONLY Blood Culture adequate volume Performed at [place] [address] [place], [place] [address]    Culture   Final    NO GROWTH 3 DAYS Performed at [facility], [place] [address], [place], [place] [address]    Report Status PENDING  Incomplete  Culture, blood (Routine X 2) w Reflex to ID Panel     Status: None (Preliminary result)   Collection Time: 03/14/18  3:13 PM  Result Value Ref Range Status   Specimen Description   Final    BLOOD RIGHT ANTECUBITAL Performed at [place] [address] [place], [place] [address]    Special Requests   Final    BOTTLES DRAWN AEROBIC ONLY Blood Culture adequate volume Performed at [place] [address], [place], [place] [address]    Culture   Final    NO GROWTH 3 DAYS Performed at [facility], [place] [address], [place], [place] [address]    Report Status PENDING  Incomplete  Culture, body fluid-bottle     Status: None (Preliminary result)   Collection Time: 03/15/18 10:52 AM  Result Value Ref Range Status   Specimen Description FLUID LEFT PLEURAL  Final   Special  Requests BOTTLES DRAWN  AEROBIC AND ANAEROBIC  Final   Culture   Final    NO GROWTH 1 DAY Performed at [facility], [place] [address] [place], [place] [address]    Report Status PENDING  Incomplete  Gram stain     Status: None   Collection Time: 03/15/18 10:52 AM  Result Value Ref Range Status   Specimen Description FLUID LEFT PLEURAL  Final   Special Requests NONE  Final   Gram Stain   Final    MODERATE WBC PRESENT, PREDOMINANTLY MONONUCLEAR NO ORGANISMS SEEN Performed at [facility], [place] [address] [place], [place] [address]    Report Status 03/16/2018 FINAL  Final  MRSA PCR Screening     Status: None   Collection Time: 03/17/18  9:23 AM  Result Value Ref Range Status   MRSA by PCR NEGATIVE NEGATIVE Final    Comment:        The GeneXpert MRSA Assay (FDA approved for NASAL specimens only), is one component of a comprehensive MRSA colonization surveillance program. It is not intended to diagnose MRSA infection nor to guide or monitor treatment for MRSA infections. Performed at [facility], [place] [address], [place], [place] [address]      Medications:   . chlorhexidine  15 mL Mouth Rinse BID  . enoxaparin (LOVENOX) injection  40 mg Subcutaneous Q24H  . feeding supplement (ENSURE ENLIVE)  237 mL Oral Q24H  . fluconazole  100 mg Oral Daily  . mouth rinse  15 mL Mouth Rinse q12n4p  . mometasone   Topical Daily  . multivitamin with minerals  1 tablet Oral Daily  . polyethylene glycol  17 g Oral Daily  . protein supplement shake  11 oz Oral BID BM  . senna-docusate  1 tablet Oral BID   Continuous Infusions: . sodium chloride 100 mL/hr at 03/18/18 0456  . piperacillin-tazobactam (ZOSYN)  IV Stopped (03/18/18 0911)  . vancomycin Stopped (03/18/18 0001)      LOS: 11 days   Charlynne Cousins  [facility] Pager [phone]  *Please refer to [email or web address], password [email or web address] to get updated schedule on who will round on this patient, as  hospitalists switch teams weekly. If 7PM-7AM, please contact night-coverage at [email or web address], password [email or web address] for any overnight needs.  03/18/2018, 11:01 AM

## 2018-03-18 NOTE — Progress Notes (Addendum)
Occupational Therapy Treatment Patient Details Name: LESS WOOLSEY MRN: [id number] DOB: [birth year] Today's Date: 03/18/2018    History of present illness 63 year old man was admitted with dizziness; SOB with exertion.  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   OT comments  Limited session as pt with limited participation. Pt did reposition in bed with OT. Pillow placed between knees. Encouraged pillow for UE but pt refused.  Pts 1st cousin present introduced her self as his power of attorney. Pts 1st cousin had many complaints regarding pts care.  When OT left room staff in room from office of pt experience.      Explained to pts cousin pt had refused OT and PT yesterday.         Mobility Bed Mobility Overal bed mobility: Needs Assistance Bed Mobility: Rolling           General bed mobility comments: S with VC repositioning  Transfers                NT          ADL either performed or assessed with clinical judgement   ADL Overall ADL's : Needs assistance/impaired                                       General ADL Comments: pt states he was uncomfortable in the bed.  OT encouraged pt to reposition. With encouagement he finally agreed. Pt able to roll in bed and bridge hips with increased time, VC and encouragement.     Vision Baseline Vision/History: Legally blind            Cognition Arousal/Alertness: Awake/alert Behavior During Therapy: Flat affect Overall Cognitive Status: Within Functional Limits for tasks assessed                                                  Progress Toward Goals  OT Goals(current goals can now be found in the care plan section)  Progress towards OT goals: OT to reassess next treatment                  Activity Tolerance  limited OT session   Patient Left   In bed with bed alarm on  Nurse Communication           Time: 3419-6222 OT Time Calculation (min): 11 min  Charges: OT General Charges $OT Visit: 1 Visit OT Treatments $Self Care/Home Management : 8-22 mins  [place], [place]   Payton Mccallum D 03/18/2018, 2:08 PM

## 2018-03-18 NOTE — Plan of Care (Signed)
  Problem: Health Behavior/Discharge Planning: Goal: Ability to manage health-related needs will improve Outcome: Progressing   Problem: Nutrition: Goal: Adequate nutrition will be maintained Outcome: Progressing   Problem: Elimination: Goal: Will not experience complications related to bowel motility Outcome: Progressing Goal: Will not experience complications related to urinary retention Outcome: Progressing   Problem: Skin Integrity: Goal: Risk for impaired skin integrity will decrease Outcome: Progressing   

## 2018-03-18 NOTE — Care Management Note (Signed)
Case Management Note  Patient Details  Name: BROOKE PAYES MRN: [id number] Date of Birth: Mar 21, 1956  Subjective/Objective: Spoke to patient/Linara Gaston(1st cousin) in rm. Patient wants HCPOA to be done-Chaplain paged-Matt will come to rm around 3p-informed patient.                   Action/Plan:d/c home w/HHC.   Expected Discharge Date:  03/13/18               Expected Discharge Plan:  [place]  In-House Referral:     Discharge planning Services  CM Consult  Post Acute Care Choice:  Home Health(Active w/[facility] HHRN-pleurx cath drain) Choice offered to:  Patient  DME Arranged:  3-N-1 DME Agency:     HH Arranged:  RN, PT [place] Agency:  [facility]  Status of Service:  Completed, signed off  If discussed at [place] of Stay Meetings, dates discussed:    Additional Comments:  Dessa Phi, RN 03/18/2018, 2:02 PM

## 2018-03-19 ENCOUNTER — Ambulatory Visit
Admit: 2018-03-19 | Discharge: 2018-03-19 | Disposition: A | Discharge status: Home / Self Care | Payer: Medicare Other | Attending: Radiation Oncology | Admitting: Radiation Oncology

## 2018-03-19 ENCOUNTER — Encounter (HOSPITAL_COMMUNITY): Payer: Self-pay

## 2018-03-19 LAB — CULTURE, BLOOD (ROUTINE X 2)
CULTURE: NO GROWTH
Culture: NO GROWTH
Special Requests: ADEQUATE
Special Requests: ADEQUATE

## 2018-03-19 LAB — CBC
HEMATOCRIT: 34.4 % — AB (ref 39.0–52.0)
HEMOGLOBIN: 11.1 g/dL — AB (ref 13.0–17.0)
MCH: 28.8 pg (ref 26.0–34.0)
MCHC: 32.3 g/dL (ref 30.0–36.0)
MCV: 89.4 fL (ref 78.0–100.0)
Platelets: 183 10*3/uL (ref 150–400)
RBC: 3.85 MIL/uL — ABNORMAL LOW (ref 4.22–5.81)
RDW: 16.3 % — AB (ref 11.5–15.5)
WBC: 12.8 10*3/uL — ABNORMAL HIGH (ref 4.0–10.5)

## 2018-03-19 LAB — CREATININE, SERUM
Creatinine, Ser: 0.95 mg/dL (ref 0.61–1.24)
GFR calc Af Amer: >60 mL/min (ref >60)
GFR calc non Af Amer: >60 mL/min (ref >60)

## 2018-03-19 NOTE — Progress Notes (Signed)
Oncology short note  Patient resting. Will f/u later in the day. No systemic therapies for [name] at this time On rx for sepsis - pending culture - per hospitalist Mx of metabolic and infectious issues per hospitalist Palliative care following Palliative Radiation ongoing. Based on patients response to optimization of pleural effusion, sepsis, nutritional status if he improves could reconsider palliative systemic therapies in the future, if deteriorating condition may need to consider comfort cares. I have had this discussion on several occasions including this hospitalization with patient and his daughter. Appreciate hospitalist cares  Kristopher Lone MD MS

## 2018-03-19 NOTE — Progress Notes (Signed)
PROGRESS NOTE [facility]   Kristopher Johnson   VHQ:[id number] DOB: [birth year]  DOA: 02/27/2018 PCP: Patient, No Pcp Per   Brief Narrative:  Kristopher Johnson is an 62 y.o. 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  Radiation oncology was consulted and recommended 2 weeks of elective radiation.  We consulted hospice and palliative care and patient and would like to continue aggressive treatment.  The patient subsequently developed a sepsis-like picture, he was started empirically on IV vancomycin and cefepime and his picture seems to be improving, his remained afebrile, his leukocytosis is improving. He is also having his Pleurx catheter drain twice a day. He was tried on oral hydration and he was not able to keep up with the losses, therefore he was started on IV fluids, now his acute renal failure has improved his hypotension and tachycardia have also improved.  Subjective: Patient seen and examined, reported feeling somewhat better.  Tolerating oral diet well.  Feel that his mouth is very dry.  Not using oxygen as he feels that does not needed.  Work with PT today.  Pleurx drain around 750 cc.  Remains afebrile  Assessment & Plan: Mantle cell lymphoma of lymph nodes of multiple regions Oncology recommendations appreciated, patient on ibrutinib, rituxan q 3 weeks. Holding Venetoclax  Palliative care consulted and counseled on patient DNR, however patient wishes to continue aggressive treatment.  Attempting palliative radiation for 2 weeks in hopes of reducing effusion.  No signs of tumor lysis  syndrome.  Oncology recommended optimization of pleural fluid and possible sepsis along with nutritional status in order to reconsider palliative systemic therapy in the future.  Out of bed as tolerated, encourage oral nutrition and hydration.  Recurrent malignant pleural effusion on the left Draining twice daily Hopefully radiation therapy help to improve pleural effusion  Acute kidney injury Prerenal related to pleural fluid losses and decreased oral intake Creatinine has improved, encourage oral hydration.  Will decrease IV fluids for now. Check BMP in a.m.  Leukocytosis No signs of sepsis, no source of infection.  No fever Patient was started empirically on IV vancomycin and Zosyn, blood cultures negative so far. MRSA PCR negative, leukocytosis improving.  Will discontinue IV antibiotics and monitor.  Orthostatic hypotension Likely from dehydration, somewhat improved. Continue oral hydration  Dysphagia Continue Diflucan  DVT prophylaxis: Lovenox Code Status: DNR Family Communication: None at bedside Disposition Plan: Unable to determine  Consultants:   Oncology  Radiation oncology  Palliative care  Procedures:   Radiation therapy  Antimicrobials: Anti-infectives (From admission, onward)   Start     Dose/Rate Route Frequency Ordered Stop   03/18/18 1000  fluconazole (DIFLUCAN) tablet 100 mg     100 mg Oral Daily 03/18/18 0743     03/16/18 1600  fluconazole (DIFLUCAN) IVPB 100 mg  Status:  Discontinued     100 mg 50 mL/hr over 60 Minutes Intravenous Every 24 hours 03/16/18 1431 03/18/18 0743   03/15/18 2200  vancomycin (VANCOCIN) 1,250 mg in sodium chloride 0.9 % 250 mL IVPB  Status:  Discontinued     1,250 mg 166.7 mL/hr over 90 Minutes Intravenous Every 24 hours  03/14/18 1614 03/18/18 1310   03/14/18 2200  vancomycin (VANCOCIN) IVPB 750 mg/150 ml premix     750 mg 150 mL/hr over 60 Minutes Intravenous  Once 03/14/18 1614 03/14/18 2300   03/14/18 2200   piperacillin-tazobactam (ZOSYN) IVPB 3.375 g     3.375 g 12.5 mL/hr over 240 Minutes Intravenous Every 8 hours 03/14/18 1614     03/14/18 1515  vancomycin (VANCOCIN) IVPB 1000 mg/200 mL premix     1,000 mg 200 mL/hr over 60 Minutes Intravenous  Once 03/14/18 1506 03/14/18 1644   03/14/18 1515  piperacillin-tazobactam (ZOSYN) IVPB 3.375 g     3.375 g 100 mL/hr over 30 Minutes Intravenous  Once 03/14/18 1506 03/14/18 1614        Objective: Vitals:   03/18/18 2128 03/19/18 0510 03/19/18 0524 03/19/18 1305  BP: 106/61 101/60  113/67  Pulse: (!) 115 98  (!) 107  Resp: 18 20  18   Temp: (!) 97.5 F (36.4 C) 97.6 F (36.4 C)  98 F (36.7 C)  TempSrc: Oral Oral  Oral  SpO2: 96% 94%  94%  Weight:   73.7 kg (162 lb 6.4 oz)   Height:        Intake/Output Summary (Last 24 hours) at 03/19/2018 1450 Last data filed at 03/19/2018 1312 Gross per 24 hour  Intake 3704.79 ml  Output 2275 ml  Net 1429.79 ml   Filed Weights   03/14/18 1301 03/18/18 0700 03/19/18 0524  Weight: 75.5 kg (166 lb 7.2 oz) 72 kg (158 lb 11.7 oz) 73.7 kg (162 lb 6.4 oz)    Examination:  General exam: NAD HEENT: OP moist and clear Respiratory system: Decreased breath sounds bilaterally, no wheezing or rales Cardiovascular system: S1 & S2 heard, RRR. No JVD, murmurs, rubs or gallops Gastrointestinal system: Abdomen is nondistended, soft and nontender.  Central nervous system: Alert and oriented. No focal neurological deficits. Extremities: No lower extremity edema, strength 4/5 bilaterally Skin: No rashes Psychiatry: Mood & affect flat and depressed   Data Reviewed: I have personally reviewed following labs and imaging studies  CBC: Recent Labs  Lab 03/14/18 0417 03/14/18 1512 03/15/18 0347 03/16/18 0424 03/19/18 0502  WBC 22.6*  --  18.7* 15.1* 12.8*  NEUTROABS 20.6*  --   --   --   --   HGB 13.0  --  12.8* 11.8* 11.1*  HCT 39.8  --  38.6* 34.8* 34.4*  MCV 88.1  --  86.9 86.4 89.4  PLT 212 195 189  153 725   Basic Metabolic Panel: Recent Labs  Lab 03/15/18 0347 03/16/18 0424 03/17/18 0341 03/18/18 0415 03/18/18 0915 03/18/18 1545 03/19/18 0502  NA 134* 130* 134* 132*  --  133*  --   K 4.6 4.5 4.6 5.8*  --  4.6  --   CL 95* 94* 100* 100*  --  101  --   CO2 24 27 26  20*  --  21*  --   GLUCOSE 165* 198* 164* 178*  --  161*  --   BUN 30* 27* 21* 22*  --  21*  --   CREATININE 1.31* 1.09 0.84 1.04  --  0.92 0.95  CALCIUM 8.4* 8.0* 8.0* 8.1*  --  8.1*  --   PHOS  --  2.8  --   --  3.7  --   --    GFR: Estimated Creatinine Clearance: 78.6 mL/min (by C-G formula based on SCr of 0.95 mg/dL). Liver Function Tests: Recent Labs  Lab  03/15/18 0347  AST 19  ALT 14*  ALKPHOS 69  BILITOT 1.2  PROT 4.9*  ALBUMIN 2.4*   No results for input(s): LIPASE, AMYLASE in the last 168 hours. No results for input(s): AMMONIA in the last 168 hours. Coagulation Profile: Recent Labs  Lab 03/14/18 1512  INR 1.14   Cardiac Enzymes: No results for input(s): CKTOTAL, CKMB, CKMBINDEX, TROPONINI in the last 168 hours. BNP (last 3 results) No results for input(s): PROBNP in the last 8760 hours. HbA1C: No results for input(s): HGBA1C in the last 72 hours. CBG: No results for input(s): GLUCAP in the last 168 hours. Lipid Profile: No results for input(s): CHOL, HDL, LDLCALC, TRIG, CHOLHDL, LDLDIRECT in the last 72 hours. Thyroid Function Tests: No results for input(s): TSH, T4TOTAL, FREET4, T3FREE, THYROIDAB in the last 72 hours. Anemia Panel: No results for input(s): VITAMINB12, FOLATE, FERRITIN, TIBC, IRON, RETICCTPCT in the last 72 hours. Sepsis Labs: Recent Labs  Lab 03/14/18 1513  PROCALCITON 20.39    Recent Results (from the past 240 hour(s))  Culture, blood (Routine X 2) w Reflex to ID Panel     Status: None   Collection Time: 03/14/18  3:12 PM  Result Value Ref Range Status   Specimen Description   Final    BLOOD LEFT ANTECUBITAL Performed at [facility] [address], [place], [place] [address]    Special Requests   Final    BOTTLES DRAWN AEROBIC ONLY Blood Culture adequate volume Performed at [facility] [address], [place], [place] [address]    Culture   Final    NO GROWTH 5 DAYS Performed at [facility], [place] [address], [place], [place] [address]    Report Status 03/19/2018 FINAL  Final  Culture, blood (Routine X 2) w Reflex to ID Panel     Status: None   Collection Time: 03/14/18  3:13 PM  Result Value Ref Range Status   Specimen Description   Final    BLOOD RIGHT ANTECUBITAL Performed at [facility] [address] [place], [place] [address]    Special Requests   Final    BOTTLES DRAWN AEROBIC ONLY Blood Culture adequate volume Performed at [facility] [address], [place], [place] [address]    Culture   Final    NO GROWTH 5 DAYS Performed at [facility], [place] [address] [place], [place] [address]    Report Status 03/19/2018 FINAL  Final  Culture, body fluid-bottle     Status: None (Preliminary result)   Collection Time: 03/15/18 10:52 AM  Result Value Ref Range Status   Specimen Description FLUID LEFT PLEURAL  Final   Special Requests BOTTLES DRAWN AEROBIC AND ANAEROBIC  Final   Culture   Final    NO GROWTH 3 DAYS Performed at [facility], [place] [address], [place], [place] [address]    Report Status PENDING  Incomplete  Gram stain     Status: None   Collection Time: 03/15/18 10:52 AM  Result Value Ref Range Status   Specimen Description FLUID LEFT PLEURAL  Final   Special Requests NONE  Final   Gram Stain   Final    MODERATE WBC PRESENT, PREDOMINANTLY MONONUCLEAR NO ORGANISMS SEEN Performed at [facility], [place] [address], [place], [place] [address]    Report Status 03/16/2018 FINAL  Final  MRSA PCR Screening     Status: None   Collection  Time: 03/17/18  9:23 AM  Result Value Ref Range Status   MRSA by PCR  NEGATIVE NEGATIVE Final    Comment:        The GeneXpert MRSA Assay (FDA approved for NASAL specimens only), is one component of a comprehensive MRSA colonization surveillance program. It is not intended to diagnose MRSA infection nor to guide or monitor treatment for MRSA infections. Performed at [facility], [place] [address], [place], [place] [address]       Radiology Studies: Dg Chest Port 1 View  Result Date: 03/18/2018 CLINICAL DATA:  Left chest tube EXAM: PORTABLE CHEST 1 VIEW COMPARISON:  CT 03/14/2018 FINDINGS: Left PleurX catheter in place. Loculated left pleural effusion again noted, unchanged. Diffuse left lung airspace opacity, likely compressive atelectasis. Right lung clear. Right Port-A-Cath is unchanged. Heart is normal size. IMPRESSION: Continued loculated left pleural effusion with left PleurX catheter in place. Opacities in the left lung likely reflect atelectasis. No real change since prior CT. Electronically Signed   By: Rolm Baptise M.D.   On: 03/18/2018 11:27      Scheduled Meds: . chlorhexidine  15 mL Mouth Rinse BID  . enoxaparin (LOVENOX) injection  40 mg Subcutaneous Q24H  . feeding supplement (ENSURE ENLIVE)  237 mL Oral Q24H  . fluconazole  100 mg Oral Daily  . mouth rinse  15 mL Mouth Rinse q12n4p  . mometasone   Topical Daily  . multivitamin with minerals  1 tablet Oral Daily  . polyethylene glycol  17 g Oral Daily  . protein supplement shake  11 oz Oral BID BM  . senna-docusate  1 tablet Oral BID   Continuous Infusions: . sodium chloride 75 mL/hr at 03/19/18 0816  . piperacillin-tazobactam (ZOSYN)  IV 3.375 g (03/19/18 1434)     LOS: 12 days    Time spent: Total of 35 minutes spent with pt, greater than 50% of which was spent in discussion of  treatment, counseling and coordination of care    Chipper Oman, MD Pager: Text Page via [email or web address]   If 7PM-7AM, please contact night-coverage [email or web address] 03/19/2018, 2:50  PM   Note - This record has been created using [employer]. Chart creation errors have been sought, but may not always have been located. Such creation errors do not reflect on the standard of medical care.

## 2018-03-19 NOTE — Progress Notes (Signed)
Patient has requested not to be asked about having a bath or wash up everyday. Patient is to let us know when he would like a bath. I educated patient on the reason our staff has to ask daily and he understood but doesn't want to be asked anymore. Will continue to monitor patient closely.

## 2018-03-19 NOTE — Progress Notes (Signed)
Physical Therapy Treatment Patient Details Name: Kristopher Johnson MRN: [id number] DOB: [birth year] Today's Date: 03/19/2018    History of Present Illness 62 year old man was admitted with dizziness; SOB with exertion.  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    PT Comments    Pt agreed to work with PT. He remains weak and limited by orthostatic hypotension-see vitals section for BP readings. He stated he wishes for therapy to continue to try to work with him. Will continue to follow and progress activity as pt will allow/tolerate.     Follow Up Recommendations  Home health PT;Supervision/Assistance - 24 hour     Equipment Recommendations  Rolling walker with 5" wheels (possibly WC )    Recommendations for Other Services       Precautions / Restrictions Precautions Precautions: Fall Precaution Comments: orthostatic, watch HR Restrictions Weight Bearing Restrictions: No    Mobility  Bed Mobility Overal bed mobility: Modified Independent                Transfers Overall transfer level: Needs assistance   Transfers: Sit to/from Stand Sit to Stand: Min guard         General transfer comment: Ant-Post swaying while standing at bedside. Close guarding for safety.  Ambulation/Gait             General Gait Details: deferred to to low BP-see vitals section. Also, pt did not wish to attempt any further activity   Stairs             Wheelchair Mobility    Modified Rankin (Stroke Patients Only)       Balance Overall balance assessment: Needs assistance           Standing balance-Leahy Scale: Fair                              Cognition Arousal/Alertness: Awake/alert Behavior During Therapy: Flat affect Overall Cognitive Status: Within Functional Limits for tasks assessed                                        Exercises      General  Comments        Pertinent Vitals/Pain Pain Assessment: Faces Faces Pain Scale: No hurt    Home Living                      Prior Function            PT Goals (current goals can now be found in the care plan section) Progress towards PT goals: Not progressing toward goals - comment(limited by BP, weakness, and lack of motivation)    Frequency    Min 3X/week      PT Plan Current plan remains appropriate    Co-evaluation              AM-PAC PT "6 Clicks" Daily Activity  Outcome Measure  Difficulty turning over in bed (including adjusting bedclothes, sheets and blankets)?: None Difficulty moving from lying on back to sitting on the side of the bed? : None Difficulty sitting down on and standing up from a chair with arms (e.g., wheelchair, bedside commode, etc,.)?: A Little Help needed moving to and from a bed to chair (including a wheelchair)?: A Little  Help needed walking in hospital room?: A Little Help needed climbing 3-5 steps with a railing? : A Little 6 Click Score: 20    End of Session   Activity Tolerance: Treatment limited secondary to medical complications (Comment)(low BP, pt not willing to try to progress activity) Patient left: in bed;with call bell/phone within reach;with bed alarm set;with family/visitor present   PT Visit Diagnosis: Other abnormalities of gait and mobility (R26.89);Muscle weakness (generalized) (M62.81)     Time: 7408-1448 PT Time Calculation (min) (ACUTE ONLY): 13 min  Charges:  $Therapeutic Activity: 8-22 mins                    G Codes:          Weston Anna, MPT Pager: [phone]

## 2018-03-20 ENCOUNTER — Ambulatory Visit
Admit: 2018-03-20 | Discharge: 2018-03-20 | Disposition: A | Discharge status: Home / Self Care | Payer: Medicare Other | Attending: Radiation Oncology | Admitting: Radiation Oncology

## 2018-03-20 ENCOUNTER — Encounter (HOSPITAL_COMMUNITY): Payer: Self-pay

## 2018-03-20 DIAGNOSIS — Z9221 Personal history of antineoplastic chemotherapy: Secondary | ICD-10-CM

## 2018-03-20 DIAGNOSIS — Z7189 Other specified counseling: Secondary | ICD-10-CM

## 2018-03-20 DIAGNOSIS — R5383 Other fatigue: Secondary | ICD-10-CM

## 2018-03-20 LAB — CBC
HCT: 34.2 % — ABNORMAL LOW (ref 39.0–52.0)
Hemoglobin: 11.1 g/dL — ABNORMAL LOW (ref 13.0–17.0)
MCH: 28.5 pg (ref 26.0–34.0)
MCHC: 32.5 g/dL (ref 30.0–36.0)
MCV: 87.9 fL (ref 78.0–100.0)
PLATELETS: 197 10*3/uL (ref 150–400)
RBC: 3.89 MIL/uL — AB (ref 4.22–5.81)
RDW: 16.2 % — AB (ref 11.5–15.5)
WBC: 11.7 10*3/uL — AB (ref 4.0–10.5)

## 2018-03-20 LAB — BASIC METABOLIC PANEL
Anion gap: 11 (ref 5–15)
BUN: 22 mg/dL — AB (ref 6–20)
CALCIUM: 8.5 mg/dL — AB (ref 8.9–10.3)
CHLORIDE: 103 mmol/L (ref 101–111)
CO2: 20 mmol/L — ABNORMAL LOW (ref 22–32)
CREATININE: 0.91 mg/dL (ref 0.61–1.24)
GFR calc Af Amer: >60 mL/min (ref >60)
Glucose, Bld: 141 mg/dL — ABNORMAL HIGH (ref 65–99)
Potassium: 4.8 mmol/L (ref 3.5–5.1)
SODIUM: 134 mmol/L — AB (ref 135–145)

## 2018-03-20 LAB — MAGNESIUM: MAGNESIUM: 2 mg/dL (ref 1.7–2.4)

## 2018-03-20 MED ORDER — IPRATROPIUM-ALBUTEROL 0.5-2.5 (3) MG/3ML IN SOLN
3.0000 mL | Freq: Three times a day (TID) | RESPIRATORY_TRACT | Status: DC
  Administered 2018-03-21 – 2018-03-30 (×23): 3 mL via RESPIRATORY_TRACT
  Filled 2018-03-20 (×28): qty 3

## 2018-03-20 MED ORDER — BOOST / RESOURCE BREEZE PO LIQD CUSTOM
1.0000 | Freq: Two times a day (BID) | ORAL | Status: DC
  Administered 2018-03-22 (×2): 1 via ORAL

## 2018-03-20 MED ORDER — PREMIER PROTEIN SHAKE
11.0000 [oz_av] | Freq: Two times a day (BID) | ORAL | Status: DC
  Administered 2018-03-20 – 2018-03-24 (×4): 11 [oz_av] via ORAL
  Filled 2018-03-20 (×10): qty 325.31

## 2018-03-20 MED ORDER — MAGIC MOUTHWASH W/LIDOCAINE
5.0000 mL | Freq: Three times a day (TID) | ORAL | Status: DC
  Administered 2018-03-20 – 2018-03-28 (×4): 5 mL via ORAL
  Filled 2018-03-20 (×30): qty 5

## 2018-03-20 NOTE — Progress Notes (Signed)
   03/20/18 1000  Clinical Encounter Type  Visited With Patient  Visit Type Initial;Psychological support;Spiritual support  Referral From Palliative care team  Consult/Referral To Chaplain  Spiritual Encounters  Spiritual Needs Emotional;Other (Comment) (Spiritual Care Conversation/Support)  Stress Factors  Patient Stress Factors Health changes;Major life changes  Family Stress Factors Health changes;Other (Comment) (Specifically his breathing )   I visited with the patient per Spiritual Care consult from the Palliative Care team. The patient stated that he didn't need Spiritual Care at this point.  The patient was sitting up in his bed and visibly having slight trouble breathing. I asked him if he was ok and he said, "yes." When I asked if he was having a hard time breathing he said, "yes." I proceeded to let the nurse know that the patient was having difficulty breathing.   Please, contact Spiritual Care for further assistance.   Chaplain Shanon Ace M.Div., [facility]

## 2018-03-20 NOTE — Progress Notes (Signed)
HEMATOLOGY/ONCOLOGY INPATIENT PROGRESS NOTE  Date of Service: 03/20/2018  Inpatient Attending: .Patrecia Pour, Christean Grief, MD   SUBJECTIVE  Kristopher Johnson reports some pain around his Pleurex tube and the left side of his chest. He notes that he has been draining less pleural fluid today.   He notes that soreness in his mouth has inhibited his food intake. He also notes some soreness of his lips and gums.  Decreased po intake. Is losing weight rapidly- partly from thoracentesis.  On review of systems, pt reports low appetite, decreased food intake, and denies leg swelling, .    OBJECTIVE:  Appears fatigued   PHYSICAL EXAMINATION: . Vitals:   03/19/18 2103 03/20/18 0456 03/20/18 0607 03/20/18 1322  BP: (!) 100/56  123/64 122/69  Pulse: (!) 123  (!) 112 (!) 118  Resp:   16 18  Temp: 98.7 F (37.1 C)  (!) 97.5 F (36.4 C) 97.6 F (36.4 C)  TempSrc: Oral     SpO2: 97%  96% 94%  Weight:  166 lb 14.2 oz (75.7 kg)    Height:       Filed Weights   03/18/18 0700 03/19/18 0524 03/20/18 0456  Weight: 158 lb 11.7 oz (72 kg) 162 lb 6.4 oz (73.7 kg) 166 lb 14.2 oz (75.7 kg)   .Body mass index is 24.65 kg/m.  GENERAL:alert, in no acute distress and comfortable SKIN: skin color, texture, turgor are normal, no rashes or significant lesions EYES: normal, conjunctiva are pink and non-injected, sclera clear OROPHARYNX:no exudate, no erythema and lips, buccal mucosa, and tongue normal  NECK: supple, no JVD, thyroid normal size, non-tender, without nodularity LYMPH:  no palpable lymphadenopathy in the cervical, axillary or inguinal LUNGS: clear to auscultation with normal respiratory effort HEART: regular rate & rhythm,  no murmurs and no lower extremity edema ABDOMEN: abdomen soft, non-tender, normoactive bowel sounds  Musculoskeletal: no cyanosis of digits and no clubbing  PSYCH: alert & oriented x 3 with fluent speech NEURO: no focal motor/sensory deficits  MEDICAL HISTORY:    Past Medical History:  Diagnosis Date  . Abscess of arm, right 10/27/2015  . 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 Shortness of breath 10/27/2015    SURGICAL HISTORY: Past Surgical History:  Procedure Laterality Date  . Cataract surgery Bilateral    Patient notes she has had bilateral Surgery in 1998 and 99  . FLEXIBLE SIGMOIDOSCOPY N/A 03/01/2017   Procedure: FLEXIBLE SIGMOIDOSCOPY;  Surgeon: Manus Gunning, MD;  Location: [facility];  Service: Gastroenterology;  Laterality: N/A;  . IR GUIDED [place]  02/20/2018  . TONSILLECTOMY     about 62years old    SOCIAL HISTORY: Social History   Socioeconomic History  . Marital status: Widowed    Spouse name: Not on file  . Number of children: Not on file  . Years of education: Not on file  . Highest education level: Not on file  Occupational History  . Not on file  Social Needs  . Financial resource strain: Not on file  . Food insecurity:    Worry: Not on file    Inability: Not on file  . Transportation needs:    Medical: Not on file    Non-medical: Not on file  Tobacco Use  . Smoking status: Current Every Day Smoker  Packs/day: 0.50    Years: 33.00    Pack years: 16.50    Types: Cigarettes  . Smokeless tobacco: Never Used  Substance and Sexual Activity  . Alcohol use: No  . Drug use: No  . Sexual activity: Not on file    Comment: Disabled, retinis pigmentosa legally blind, 4children MPOA Lynnn(cousin)  Lifestyle  . Physical activity:    Days per week: Not on file    Minutes per session: Not on file  . Stress: Not on file  Relationships  . Social connections:    Talks on phone: Not on file    Gets together: Not on file    Attends religious service: Not on file    Active member of club or organization: Not on file    Attends meetings  of clubs or organizations: Not on file    Relationship status: Not on file  . Intimate partner violence:    Fear of current or ex partner: Not on file    Emotionally abused: Not on file    Physically abused: Not on file    Forced sexual activity: Not on file  Other Topics Concern  . Not on file  Social History Narrative  . Not on file    FAMILY HISTORY: Family History  Problem Relation Age of Onset  . Colon cancer Neg Hx     ALLERGIES:  has No Known Allergies.  MEDICATIONS:  Scheduled Meds: . chlorhexidine  15 mL Mouth Rinse BID  . enoxaparin (LOVENOX) injection  40 mg Subcutaneous Q24H  . feeding supplement  1 Container Oral BID BM  . fluconazole  100 mg Oral Daily  . magic mouthwash w/lidocaine  5 mL Oral TID  . mouth rinse  15 mL Mouth Rinse q12n4p  . mometasone   Topical Daily  . multivitamin with minerals  1 tablet Oral Daily  . polyethylene glycol  17 g Oral Daily  . protein supplement shake  11 oz Oral BID BM  . senna-docusate  1 tablet Oral BID   Continuous Infusions: . sodium chloride 75 mL/hr at 03/20/18 0456   PRN Meds:.alum & mag hydroxide-simeth, HYDROcodone-acetaminophen, ipratropium-albuterol, morphine injection, ondansetron **OR** ondansetron (ZOFRAN) IV, sodium chloride flush, traMADol  REVIEW OF SYSTEMS:    10 Point review of Systems was done is negative except as noted above.   LABORATORY DATA:  I have reviewed the data as listed  . CBC Latest Ref Rng & Units 03/20/2018 03/19/2018 03/16/2018  WBC 4.0 - 10.5 K/uL 11.7(H) 12.8(H) 15.1(H)  Hemoglobin 13.0 - 17.0 g/dL 11.1(L) 11.1(L) 11.8(L)  Hematocrit 39.0 - 52.0 % 34.2(L) 34.4(L) 34.8(L)  Platelets 150 - 400 K/uL 197 183 153    . CMP Latest Ref Rng & Units 03/20/2018 03/19/2018 03/18/2018  Glucose 65 - 99 mg/dL 141(H) - 161(H)  BUN 6 - 20 mg/dL 22(H) - 21(H)  Creatinine 0.61 - 1.24 mg/dL 0.91 0.95 0.92  Sodium 135 - 145 mmol/L 134(L) - 133(L)  Potassium 3.5 - 5.1 mmol/L 4.8 - 4.6  Chloride 101  - 111 mmol/L 103 - 101  CO2 22 - 32 mmol/L 20(L) - 21(L)  Calcium 8.9 - 10.3 mg/dL 8.5(L) - 8.1(L)  Total Protein 6.5 - 8.1 g/dL - - -  Total Bilirubin 0.3 - 1.2 mg/dL - - -  Alkaline Phos 38 - 126 U/L - - -  AST 15 - 41 U/L - - -  ALT 17 - 63 U/L - - -     RADIOGRAPHIC STUDIES: I  have personally reviewed the radiological images as listed and agreed with the findings in the report. Dg Chest 2 View  Result Date: 02/26/2018 CLINICAL DATA:  History of lung carcinoma with shortness of Breath EXAM: CHEST - 2 VIEW COMPARISON:  02/20/2018 PleurX, 02/14/2018 FINDINGS: Cardiac shadow is obscured by a significantly enlarged left-sided pleural effusion. A left PleurX catheter is noted in place. Minimal left lung aeration is noted. Right chest wall port is seen in satisfactory position. Right lung is clear. No bony abnormality is noted. IMPRESSION: Enlarging left-sided pleural effusion. Electronically Signed   By: Inez Catalina M.D.   On: 02/28/2018 17:00   Ct Chest Wo Contrast  Result Date: 03/14/2018 CLINICAL DATA:  Respiratory illness. 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 EXAM: CT CHEST WITHOUT CONTRAST TECHNIQUE: Multidetector CT imaging of the chest was performed following the standard protocol without IV contrast. COMPARISON:  Chest CT angiogram dated 03/09/2018. chest CT dated 03/05/2018. Chest CT dated 01/28/2018. FINDINGS: Cardiovascular: Heart size is normal. Heart is displaced to the RIGHT. No pericardial effusion seen. No thoracic aortic aneurysm seen. Mediastinum/Nodes: There is extensive mediastinal lymphadenopathy, not significantly changed compared to previous CTs of 03/09/2018 and 03/05/2018, but significantly progressed compared to chest CT of 01/28/2018 Lungs/Pleura: Loculated appearing LEFT pleural effusion is decreased in size compared to most recent chest CT of 03/09/2018, chest tube in place with tip directed  towards the lateral aspects of the LEFT upper lung. Multifocal masslike nodularity along the pleura throughout the LEFT chest. Most prominent component of this pleural masslike nodularity is again seen at the anterior aspects of the LEFT upper lung, measuring approximately 6.3 cm transverse dimension and 6.2 cm AP dimension (series 2, images 44 through 48), measuring only 2.5 cm on chest CT of 01/28/2018, also slightly increased in size compared to the most recent chest CT of 03/09/2018 where it measured approximately 5.8 x 5.7 cm. The mass effect on the mediastinum, with rightward mediastinal shift, is not significantly changed compared to the most recent chest CT of 03/09/2018. Small peripheral ground-glass opacity within the lateral aspects of the RIGHT upper lobe, pneumonia versus edema. RIGHT lung otherwise clear. No pneumothorax seen. Upper Abdomen: Characterization of the upper abdomen is limited by patient motion artifact. Musculoskeletal: No acute or suspicious osseous finding. Additional prominent lymph nodes in the LEFT axilla, significantly increased in size compared to previous chest CT of 01/28/2018, largest now with short axis measurement of approximately 2.2 cm (previously 1 cm). IMPRESSION: 1. Significant progression of disease compared to chest CT of 01/28/2018, and least mild progression of disease compared to the more recent chest CT of 03/09/2018, as detailed above. 2. LEFT pleural effusion, moderate to large in size, likely multiloculated, with chest tube in place, slightly decreased in size compared to most recent chest CT of 03/09/2018. 3. The mass effect on the mediastinum, with associated rightward mediastinal shift, is not significantly changed compared to the most recent chest CT. 4. Small peripheral ground-glass opacity within the lateral aspects of the RIGHT upper lobe, edema versus early pneumonia, favor edema or atelectasis. RIGHT lung is otherwise clear. 5. Enlarged lymph nodes in  the LEFT axilla, increased in size compared to previous exams, again compatible with progression of disease. Electronically Signed   By: Franki Cabot M.D.   On: 03/14/2018 14:02   Ct Chest Wo Contrast  Result Date: 03/05/2018 CLINICAL DATA:  Shortness of breath, cough. EXAM: CT CHEST WITHOUT  CONTRAST TECHNIQUE: Multidetector CT imaging of the chest was performed following the standard protocol without IV contrast. COMPARISON:  PET-CT 02/05/2018 and previous FINDINGS: Cardiovascular: Right IJ port catheter to the distal SVC. Heart size normal. No pericardial effusion. Mediastinum/Nodes: Left supraclavicular, anterior mediastinal, right paratracheal, and subcarinal adenopathy as before. There is mild rightward mediastinal shift secondary to left pleural disease. Anterior left pleural mass is probably associated with mediastinal invasion. Lungs/Pleura: Progressive, near complete opacification of the left lung. Large left pleural effusion with good position of tunneled drain catheter. Extensive pleural thickening and masses. Right lung clear. Upper Abdomen: Stable epigastric adenopathy. Stable probable cyst in left hepatic lobe. No acute findings. Musculoskeletal: Spondylitic changes in the visualized lower cervical spine. No fracture or worrisome bone lesion. IMPRESSION: 1. Progressive opacification of left hemithorax secondary to nodular pleural disease and large effusion. 2. The tunneled left pleural drain catheter appears appropriately positioned, suggesting the patient may benefit from more aggressive thoracentesis. 3. Persistent left supraclavicular, mediastinal, and epigastric adenopathy. Electronically Signed   By: Lucrezia Europe M.D.   On: 03/05/2018 14:58   Ct Angio Chest Pe W Or Wo Contrast  Result Date: 03/09/2018 CLINICAL DATA:  62 year old male with stage IV lymphoma. Recurrent malignant effusion with catheter placed Feb 20, 2018. Dizziness and shortness of breath. EXAM: CT ANGIOGRAPHY CHEST WITH  CONTRAST TECHNIQUE: Multidetector CT imaging of the chest was performed using the standard protocol during bolus administration of intravenous contrast. Multiplanar CT image reconstructions and MIPs were obtained to evaluate the vascular anatomy. CONTRAST:  192mL ISOVUE-370 IOPAMIDOL (ISOVUE-370) INJECTION 76% COMPARISON:  Chest CT Mar 05, 2018 FINDINGS: Cardiovascular: The heart mediastinum are shifted to the right due to a large left pleural effusion which will be described below. The thoracic aorta is normal in caliber with no atherosclerosis, aneurysm, or dissection. The heart size is normal and stable. No obvious coronary artery calcifications. Evaluation of pulmonary arterial branches on the right is limited due to significant atelectatic lung due to the large right pleural effusion. No pulmonary emboli identified. Mediastinum/Nodes: There is a large left pleural effusion filling nearly the entire left hemithorax resulting in significant atelectatic/collapsed lung on the left. Only minimal aerated lung remains on the left. The effusion results in shift of the heart mediastinum to the right. There is diffuse thickening of the right-sided pleura with pleural based masses remaining. The largest anterior pleural base mass on series 4, image 27 extends in the anterior chest wall, stable. No right-sided pleural effusion. No pericardial effusion. No right axillary adenopathy. Mildly enlarged nodes are seen in the base of the neck, particularly on the left. A 14 mm node is seen on image 5 in the base of the neck on the left. Left axillary and retropectoral adenopathy remains, similar in the interval. There is skin thickening and edema associated with the left chest wall including the breast. This does not appear to be confined to the breast. The chest wall is otherwise unremarkable. Adenopathy remains in the mediastinum. The esophagus and thyroid are unremarkable. Lungs/Pleura: There is only minimal aerated lung on  the left. The right lung is free of infiltrate, nodule, or mass. No pneumothorax. The central airways are unremarkable. Upper Abdomen: Mild adenopathy remains in the upper abdomen. Musculoskeletal: No chest wall abnormality. No acute or significant osseous findings. Review of the MIP images confirms the above findings. IMPRESSION: 1. No pulmonary emboli identified. Evaluation of left-sided pulmonary arterial branches is limited due to near complete collapse of the left lung secondary  to a large left pleural effusion. 2. There is a large left pleural effusion filling the left hemithorax resulting in significant collapse of the left lung. The pleura is thickened and there are multiple pleural-based masses, unchanged. The large left effusion shifts the heart and mediastinum to the right. 3. Adenopathy in the left axilla, base of neck, left greater than right, mediastinum, and upper abdomen consistent with the patient's known lymphoma. Electronically Signed   By: Dorise Bullion III M.D   On: 03/09/2018 08:50   Dg Chest Left Decubitus  Result Date: 02/12/2018 CLINICAL DATA:  History left pleural effusion and prior tunneled PleurX catheter placement. Possible enlarging loculated pleural effusion by chest x-ray. EXAM: CHEST - LEFT DECUBITUS COMPARISON:  Chest x-ray earlier today. FINDINGS: It is difficult to determine by a decubitus chest x-ray whether there is a significant component of pleural fluid. It does not appear that there is a large amount layering fluid, but there may be some layering component. Underlying atelectasis and/or consolidation of the lower lung is suspected. IMPRESSION: It is difficult to tell by decubitus chest x-ray whether there is a significant component of pleural fluid present. A CT of the chest would be needed to determine whether fluid is present and if so, its relationship to the existing tunneled PleurX drainage catheter. Electronically Signed   By: Aletta Edouard M.D.   On:  02/14/2018 20:41   Ir Guided Niel Hummer W Catheter Placement  Result Date: 02/21/2018 INDICATION: 62 year old male with a history of recurrent malignant left-sided pleural effusion. EXAM: IMAGE GUIDED TUNNELED PLEURAL CATHETER MEDICATIONS: 2.0 g Ancef. The antibiotics were administered within an appropriate time frame prior to the initiation of the procedure. ANESTHESIA/SEDATION: Fentanyl 100 mcg IV; Versed 2.0 mg IV Moderate Sedation Time:  13 minutes The patient was continuously monitored during the procedure by the interventional radiology nurse under my direct supervision. COMPLICATIONS: None PROCEDURE: The procedure, risks, benefits, and alternatives were explained to the patient and the patient's family. Specific risks that were addressed included bleeding, infection, pneumothorax, need for further procedure, chance of delayed pneumothorax or hemorrhage, hemoptysis, cardiopulmonary collapse, death. Questions regarding the procedure were encouraged and answered. The patient understands and consents to the procedure. The left chest wall was prepped with Betadine in a sterile fashion, and a sterile drape was applied covering the operative field. A sterile gown and sterile gloves were used for the procedure. Local anesthesia was provided with 1% Lidocaine. Ultrasound image documentation was performed. After creating a small skin incision, a 19 gauge needle was advanced into the pleural cavity under ultrasound guidance. A guide wire was then advanced under fluoroscopy into the pleural space. Pleural access was dilated serially and a 16-French peel-away sheath placed. The skin and subcutaneous tissues were generously infiltrated with 1% lidocaine from the puncture site over the pleura along the intercostal margin anteriorly. A small stab incision was made with 11 blade scalpel at the insertion site of the catheter, and the catheter was back tunneled to the site at the pleural puncture. A tunneled CareFusion Pleurex  catheter was placed. This was tunneled from the incision 5 cm anterior to the pleural access to the access site. The catheter was advanced through the peel-away sheath. The sheath was then removed. Final catheter positioning was confirmed with a fluoroscopic spot image. The access incision was closed with Dermabond, applied to the catheterization incision. Large volume thoracentesis was performed through the new catheter utilizing gravity drainage bag. The patient tolerated the procedure well and remained hemodynamically  stable throughout. No complications were encountered and no significant blood loss was encountered. IMPRESSION: Status post left-sided tunneled pleural catheter. Signed, Dulcy Fanny. Earleen Newport, DO [facility] Rad[facility] Electronically Signed   By: Corrie Mckusick D.O.   On: 02/21/2018 08:45   Dg Chest Port 1 View  Result Date: 03/18/2018 CLINICAL DATA:  Left chest tube EXAM: PORTABLE CHEST 1 VIEW COMPARISON:  CT 03/14/2018 FINDINGS: Left PleurX catheter in place. Loculated left pleural effusion again noted, unchanged. Diffuse left lung airspace opacity, likely compressive atelectasis. Right lung clear. Right Port-A-Cath is unchanged. Heart is normal size. IMPRESSION: Continued loculated left pleural effusion with left PleurX catheter in place. Opacities in the left lung likely reflect atelectasis. No real change since prior CT. Electronically Signed   By: Rolm Baptise M.D.   On: 03/18/2018 11:27    ASSESSMENT & PLAN:  62 y.o. 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 ECHO nl EF ECOG PS 2 but activities significantly limited due to being legally blind HIV/Hep C/Hep B neg  PET/CT scan after 3 cycles of R CHOP show good overall response. Stomach lesion still with very FDG uptake ? Residual  lymphoma versus inflammation.  Patient is status post 6 cycles of R CHOP. PET/CT after 6 cycles shows resolution of hypermetabolic lymphadenopathy and gastric wall activity . Patient was on maintenance Rituxan for 1 year now prior to relapse. -Labs reviewedtoday (02/21/18) of CBC, CMP, Reticulocytes, Uric acid, phosphorus, and LDHis as follows: all values are WNL except for CBC with WBC at 11.5. CMP with total protein at 5.6, albumin at 2.8. LDH decreased to 472.  -I discussed that we will monitor his counts and give IVF weekly as well as add on rituaxin.  -F/u as scheduled.  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 CT chest abdomen pelvis showed multiple areas of lymphadenopathy in the abdomen as well as in the chest suggesting significant recurrence. PET/CT scan 03/25/2017 shows diffuse involvement with mantle cell lymphoma involving the chest abdomen pelvis and bowels. 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 Workup showed malignant pleural effusion and b/l pleural involvement and rectal urgency due to mantle cell lymphoma causing mass in rectum/sigmoid -PET/CT 10/25/2017 - with significant mantle cell lymphoma progression again. -He was placed onIbrutinib 560 mg p.o. Daily in 10/2017 and has resolution of most of his symptoms and normalization of elevated LDH levels.  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 PET/CT 02/05/2018 with mixed treatment response. S/p pleurx catheter placement. Was on Ibrutinib + added Rituxan and Venetoclax but could not tolerate Venetoclax at 100mg  po  daily Venetoclax and Ibrutinib held Admitted with uncontrolled left pleural effusion and sepsis/SIRS (procalcitonin 20) Started on palliative RT to left lung/pleural disease from 03/13/2018  5) Rapidly developing splenomegaly related to relapsed MCL  6) AKI with hyperkalemia today 03/14/2018 ? TLS . Already on allopurinol. Improved.  7) New progressive neutrophilia concern for sepsis vs SIRS from tumor lysis. Concern for sepsis given procalctonin 20 --  s/p broad spectrum antibiotics PLAN:  -renal function and leucocytosis much improved. -s/p broad spectrum abx -holding systemic therapies in the hospital -palliative RT -aggressive pleural fluid drainage with IVF/IV albumin support. -dietician input to try to optimze po intake -if functional status stable and pleural effusion issues controlled with palliative RT will consider restarting palliative systemic therapies as outpatient. -discussed goals of care care multiple times -- if worsening functional status and based on patient/family input - transition to best supportive care is a reasonable option.  . The total time spent in the appointment was 25 minutes and more than 50% was on counseling and direct patient cares.      Kristopher Lone MD MS AAHIVMS [facility] Hematology/Oncology Physician [facility]  (Office):       [phone] (Work cell):  [phone] (Fax):           [phone]  03/20/2018 4:14 PM  I, Baldwin [place], am acting as a Education administrator for Dr Irene Limbo.   .I have reviewed the above documentation for accuracy and completeness, and I agree with the above. Kristopher Lone MD MS

## 2018-03-20 NOTE — Care Management Note (Signed)
Case Management Note  Patient Details  Name: Kristopher Johnson MRN: [id number] Date of Birth: 01-28-1956  Subjective/Objective: Asked to explore LTACH-patient does not have any icu days-not appropriate for LTACH. Current d/c plan home w/HHC-Active w/[facility] HHRN-pleurx drain-they can continue to provide instruction for pleurx drain w/patient/family.Attending updated.                  Action/Plan:d/c home w/HHC   Expected Discharge Date:  03/13/18               Expected Discharge Plan:  [place]  In-House Referral:     Discharge planning Services  CM Consult  Post Acute Care Choice:  Home Health(Active w/[facility] HHRN-pleurx cath drain) Choice offered to:  Patient  DME Arranged:  3-N-1 DME Agency:     HH Arranged:  RN, PT [place] Agency:  [place]  Status of Service:  Completed, signed off  If discussed at [place] of Stay Meetings, dates discussed:    Additional Comments:  Dessa Phi, RN 03/20/2018, 4:17 PM

## 2018-03-20 NOTE — Progress Notes (Signed)
PROGRESS NOTE [facility]   Kristopher Johnson   CZY:[id number] DOB: July 12, 1956  DOA: 02/28/2018 PCP: Patient, No Pcp Per   Brief Narrative:  Kristopher Johnson is an 62 y.o. 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  Radiation oncology was consulted and recommended 2 weeks of elective radiation.  We consulted hospice and palliative care and patient and would like to continue aggressive treatment.  The patient subsequently developed a sepsis-like picture, he was started empirically on IV vancomycin and cefepime and his picture seems to be improving, his remained afebrile, his leukocytosis is improving. He is also having his Pleurx catheter drain twice a day. He was tried on oral hydration and he was not able to keep up with the losses, therefore he was started on IV fluids, now his acute renal failure has improved his hypotension and tachycardia have also improved.  Subjective: Patient seen and examined, feeling ok today, not wearing oxygen. Drained 350 cc from Pleurex this am.   Assessment & Plan: Mantle cell lymphoma of lymph nodes of multiple regions Oncology recommendations appreciated, patient on ibrutinib, rituxan q 3 weeks. Holding Venetoclax  Palliative care consulted and counseled on patient DNR, however patient wishes to continue aggressive treatment.  Attempting palliative radiation for 2 weeks in hopes of reducing effusion.  No signs of tumor lysis syndrome.  Oncology recommended optimization of pleural fluid and possible sepsis along with nutritional status in order to reconsider  palliative systemic therapy in the future.  Out of bed as tolerated, encourage oral nutrition and hydration.  Recurrent malignant pleural effusion on the left Continue to drain twice daily Hopefully radiation therapy help to improve pleural effusion  Acute kidney injury Prerenal related to pleural fluid losses and decreased oral intake Creatinine has improved, encourage oral hydration.  Will decrease IVF further, hopefully can d/c in AM if renal function remains stable and losses are under control. Check BMP in a.m.  Leukocytosis - improved  No signs of sepsis, no source of infection.  No fever Patient was started empirically on IV vancomycin and Zosyn, blood cultures negative so far. MRSA PCR negative. WBC continues to improve. Continue to monitor off abx   Orthostatic hypotension Likely from dehydration, somewhat improved. Continue oral hydration  Dysphagia Continue Diflucan Add Magic mouth wash  DVT prophylaxis: Lovenox Code Status: DNR Family Communication: None at bedside Disposition Plan: Unable to determine  Consultants:   Oncology  Radiation oncology  Palliative care  Procedures:   Radiation therapy  Antimicrobials: Anti-infectives (From admission, onward)   Start     Dose/Rate Route Frequency Ordered Stop   03/18/18 1000  fluconazole (DIFLUCAN) tablet 100 mg     100 mg Oral Daily 03/18/18 0743     03/16/18 1600  fluconazole (DIFLUCAN) IVPB 100 mg  Status:  Discontinued     100 mg 50 mL/hr over 60 Minutes Intravenous Every 24 hours 03/16/18 1431 03/18/18 0743   03/15/18 2200  vancomycin (VANCOCIN) 1,250 mg in sodium chloride 0.9 % 250 mL IVPB  Status:  Discontinued     1,250 mg 166.7 mL/hr over 90 Minutes Intravenous Every 24 hours 03/14/18 1614 03/18/18 1310  03/14/18 2200  vancomycin (VANCOCIN) IVPB 750 mg/150 ml premix     750 mg 150 mL/hr over 60 Minutes Intravenous  Once 03/14/18 1614 03/14/18 2300   03/14/18 2200  piperacillin-tazobactam (ZOSYN)  IVPB 3.375 g  Status:  Discontinued     3.375 g 12.5 mL/hr over 240 Minutes Intravenous Every 8 hours 03/14/18 1614 03/20/18 0750   03/14/18 1515  vancomycin (VANCOCIN) IVPB 1000 mg/200 mL premix     1,000 mg 200 mL/hr over 60 Minutes Intravenous  Once 03/14/18 1506 03/14/18 1644   03/14/18 1515  piperacillin-tazobactam (ZOSYN) IVPB 3.375 g     3.375 g 100 mL/hr over 30 Minutes Intravenous  Once 03/14/18 1506 03/14/18 1614        Objective: Vitals:   03/19/18 2103 03/20/18 0456 03/20/18 0607 03/20/18 1322  BP: (!) 100/56  123/64 122/69  Pulse: (!) 123  (!) 112 (!) 118  Resp:   16 18  Temp: 98.7 F (37.1 C)  (!) 97.5 F (36.4 C) 97.6 F (36.4 C)  TempSrc: Oral     SpO2: 97%  96% 94%  Weight:  75.7 kg (166 lb 14.2 oz)    Height:        Intake/Output Summary (Last 24 hours) at 03/20/2018 1443 Last data filed at 03/20/2018 1400 Gross per 24 hour  Intake 1838.96 ml  Output 1775 ml  Net 63.96 ml   Filed Weights   03/18/18 0700 03/19/18 0524 03/20/18 0456  Weight: 72 kg (158 lb 11.7 oz) 73.7 kg (162 lb 6.4 oz) 75.7 kg (166 lb 14.2 oz)    Examination: No changes on exam from 03/19/2018  General exam: NAD HEENT: OP moist and clear Respiratory system: Decreased breath sounds bilaterally, no wheezing or rales Cardiovascular system: S1 & S2 heard, RRR. No JVD, murmurs, rubs or gallops Gastrointestinal system: Abdomen is nondistended, soft and nontender.  Central nervous system: Alert and oriented. No focal neurological deficits. Extremities: No lower extremity edema, strength 4/5 bilaterally Skin: No rashes Psychiatry: Mood & affect flat and depressed   Data Reviewed: I have personally reviewed following labs and imaging studies  CBC: Recent Labs  Lab 03/14/18 0417 03/14/18 1512 03/15/18 0347 03/16/18 0424 03/19/18 0502 03/20/18 0444  WBC 22.6*  --  18.7* 15.1* 12.8* 11.7*  NEUTROABS 20.6*  --   --   --   --   --   HGB 13.0  --  12.8* 11.8* 11.1* 11.1*  HCT 39.8   --  38.6* 34.8* 34.4* 34.2*  MCV 88.1  --  86.9 86.4 89.4 87.9  PLT 212 195 189 153 183 761   Basic Metabolic Panel: Recent Labs  Lab 03/16/18 0424 03/17/18 0341 03/18/18 0415 03/18/18 0915 03/18/18 1545 03/19/18 0502 03/20/18 0444  NA 130* 134* 132*  --  133*  --  134*  K 4.5 4.6 5.8*  --  4.6  --  4.8  CL 94* 100* 100*  --  101  --  103  CO2 27 26 20*  --  21*  --  20*  GLUCOSE 198* 164* 178*  --  161*  --  141*  BUN 27* 21* 22*  --  21*  --  22*  CREATININE 1.09 0.84 1.04  --  0.92 0.95 0.91  CALCIUM 8.0* 8.0* 8.1*  --  8.1*  --  8.5*  MG  --   --   --   --   --   --  2.0  PHOS 2.8  --   --  3.7  --   --   --    GFR: Estimated Creatinine Clearance: 82 mL/min (by C-G formula based on SCr of 0.91 mg/dL). Liver Function Tests: Recent Labs  Lab 03/15/18 0347  AST 19  ALT 14*  ALKPHOS 69  BILITOT 1.2  PROT 4.9*  ALBUMIN 2.4*   No results for input(s): LIPASE, AMYLASE in the last 168 hours. No results for input(s): AMMONIA in the last 168 hours. Coagulation Profile: Recent Labs  Lab 03/14/18 1512  INR 1.14   Cardiac Enzymes: No results for input(s): CKTOTAL, CKMB, CKMBINDEX, TROPONINI in the last 168 hours. BNP (last 3 results) No results for input(s): PROBNP in the last 8760 hours. HbA1C: No results for input(s): HGBA1C in the last 72 hours. CBG: No results for input(s): GLUCAP in the last 168 hours. Lipid Profile: No results for input(s): CHOL, HDL, LDLCALC, TRIG, CHOLHDL, LDLDIRECT in the last 72 hours. Thyroid Function Tests: No results for input(s): TSH, T4TOTAL, FREET4, T3FREE, THYROIDAB in the last 72 hours. Anemia Panel: No results for input(s): VITAMINB12, FOLATE, FERRITIN, TIBC, IRON, RETICCTPCT in the last 72 hours. Sepsis Labs: Recent Labs  Lab 03/14/18 1513  PROCALCITON 20.39    Recent Results (from the past 240 hour(s))  Culture, blood (Routine X 2) w Reflex to ID Panel     Status: None   Collection Time: 03/14/18  3:12 PM  Result Value  Ref Range Status   Specimen Description   Final    BLOOD LEFT ANTECUBITAL Performed at [facility] [address], [place], [place] [address]    Special Requests   Final    BOTTLES DRAWN AEROBIC ONLY Blood Culture adequate volume Performed at [facility] [address], [place], [place] [address]    Culture   Final    NO GROWTH 5 DAYS Performed at [facility], [place] [address], [place], [place] [address]    Report Status 03/19/2018 FINAL  Final  Culture, blood (Routine X 2) w Reflex to ID Panel     Status: None   Collection Time: 03/14/18  3:13 PM  Result Value Ref Range Status   Specimen Description   Final    BLOOD RIGHT ANTECUBITAL Performed at [facility] [address], [place], [place] [address]    Special Requests   Final    BOTTLES DRAWN AEROBIC ONLY Blood Culture adequate volume Performed at [facility] [address] [place], [place] [address]    Culture   Final    NO GROWTH 5 DAYS Performed at [facility], [place] [address], [place], [place] [address]    Report Status 03/19/2018 FINAL  Final  Culture, body fluid-bottle     Status: None (Preliminary result)   Collection Time: 03/15/18 10:52 AM  Result Value Ref Range Status   Specimen Description FLUID LEFT PLEURAL  Final   Special Requests BOTTLES DRAWN AEROBIC AND ANAEROBIC  Final   Culture   Final    NO GROWTH 4 DAYS Performed at [facility], [place] [address], [place], [place] [address]    Report Status PENDING  Incomplete  Gram stain     Status: None   Collection Time: 03/15/18 10:52 AM  Result Value Ref Range Status   Specimen Description FLUID LEFT PLEURAL  Final   Special Requests NONE  Final   Gram Stain   Final    MODERATE WBC PRESENT, PREDOMINANTLY MONONUCLEAR NO ORGANISMS SEEN Performed  at [facility], [place] [address] [place], [place] [address]    Report Status 03/16/2018 FINAL  Final  MRSA  PCR Screening     Status: None   Collection Time: 03/17/18  9:23 AM  Result Value Ref Range Status   MRSA by PCR NEGATIVE NEGATIVE Final    Comment:        The GeneXpert MRSA Assay (FDA approved for NASAL specimens only), is one component of a comprehensive MRSA colonization surveillance program. It is not intended to diagnose MRSA infection nor to guide or monitor treatment for MRSA infections. Performed at [facility], [place] [address], [place], [place] [address]       Radiology Studies: No results found.    Scheduled Meds: . chlorhexidine  15 mL Mouth Rinse BID  . enoxaparin (LOVENOX) injection  40 mg Subcutaneous Q24H  . feeding supplement  1 Container Oral BID BM  . fluconazole  100 mg Oral Daily  . magic mouthwash w/lidocaine  5 mL Oral TID  . mouth rinse  15 mL Mouth Rinse q12n4p  . mometasone   Topical Daily  . multivitamin with minerals  1 tablet Oral Daily  . polyethylene glycol  17 g Oral Daily  . protein supplement shake  11 oz Oral BID BM  . senna-docusate  1 tablet Oral BID   Continuous Infusions: . sodium chloride 75 mL/hr at 03/20/18 0456     LOS: 13 days    Time spent: Total of 25 minutes spent with pt, greater than 50% of which was spent in discussion of  treatment, counseling and coordination of care   Chipper Oman, MD Pager: Text Page via [email or web address]   If 7PM-7AM, please contact night-coverage [email or web address] 03/20/2018, 2:43 PM   Note - This record has been created using [employer]. Chart creation errors have been sought, but may not always have been located. Such creation errors do not reflect on the standard of medical care.

## 2018-03-20 NOTE — Progress Notes (Signed)
Nutrition Follow-up  DOCUMENTATION CODES:   Non-severe (moderate) malnutrition in context of chronic illness  INTERVENTION:  - Continue Premier Protein BID. - Will d/c Ensure Enlive. - Will order Boost Breeze BID, each supplement provides 250 kcal and 9 grams of protein. - Continue to encourage PO intakes.   NUTRITION DIAGNOSIS:   Moderate Malnutrition related to chronic illness, catabolic illness, cancer and cancer related treatments as evidenced by moderate fat depletion, moderate muscle depletion. -updated  GOAL:   Patient will meet greater than or equal to 90% of their needs -unmet  MONITOR:   PO intake, Supplement acceptance, Weight trends, Labs  ASSESSMENT:   62 y.o. 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  Brought to the ED via EMS with complaints of dizziness on standing of ~ 4 days duration, such that patient was not able to get out of bed on the day of admission. SOB with exertion--more chronic, over the past month, with associated orthopnea. Since Pleurex placement, 1L pleural fluid has been drained every 48 hours by home health nurse who comes to patient's house. Patient is on venetoclax for his lymphoma. Initial dose was 20 mg, with gradual increase in dose every week. The day before admission, patient took 100 mg.  Patient states since he started taking the medication he has gotten progressively worse, and he feels his current symptoms especially dizziness is related to increased dose of venetoclax. Patient reports nausea and poor p.o. intake since starting venetoclax.  Current weight consistent with weight from last week. Per chart, patient consumed 100% of lunch on 6/8 and 100% of breakfast on 6/9, 0% of breakfast and lunch on 6/11 and 6/12. No other intakes documented since last RD visit.   Patient has been refusing  all bottles of Ensure and has accepted Premier about 25% of the time offered. He has 2 cartons of milk, an Ensure, a Premier Protein, 2 bottles of Gatorade, 2 pre-packaged snack cakes, and a cup of grapes on bedside table. Patient denies abdominal pain or nausea but states that he is in pain as soon as RD enters the room but then does not mention it any more and is able to have conversation without issue. He states that appetite has been poor and that he did not have anything for breakfast other than orange juice. He requested another cup of orange juice which RD provided to him.   Talked with patient about the importance of eating well during hospitalization. Focused on preservation of lean body mass and ability to discharge from the hospital.   Medications reviewed; 1 packet Miralax/day, 1 tablet Senokot BID.  Labs reviewed; Na: 134 mmol/L, BUN: 22 mg/dL, Ca: 8.5 mg/dL.   IVF: NS @ 75 mL/hr.       Diet Order:   Diet Order           Diet general        Diet regular Room service appropriate? Yes; Fluid consistency: Thin  Diet effective now          EDUCATION NEEDS:   No education needs have been identified at this time  Skin:  Skin Assessment: Reviewed RN Assessment  Last BM:  6/11  Height:   Ht Readings from Last 1 Encounters:  02/26/2018 5\' 9"  (1.753 m)    Weight:   Wt Readings from Last 1 Encounters:  03/20/18 166 lb 14.2 oz (75.7  kg)    Ideal Body Weight:  72.73 kg  BMI:  Body mass index is 24.65 kg/m.  Estimated Nutritional Needs:   Kcal:  0722-5750 (26-28 kcal/kg)  Protein:  116-132 grams (1.4-1.6 grams/kg)  Fluid:  >/= 2.1 L/day    Jarome Matin, MS, RD, LDN, [facility] Clinical Dietitian Pager # [phone] After hours/weekend pager # [phone]

## 2018-03-21 ENCOUNTER — Ambulatory Visit
Admit: 2018-03-21 | Discharge: 2018-03-21 | Disposition: A | Discharge status: Home / Self Care | Payer: Medicare Other | Attending: Radiation Oncology | Admitting: Radiation Oncology

## 2018-03-21 DIAGNOSIS — E440 Moderate protein-calorie malnutrition: Secondary | ICD-10-CM

## 2018-03-21 LAB — CBC
HEMATOCRIT: 33.1 % — AB (ref 39.0–52.0)
HEMOGLOBIN: 10.8 g/dL — AB (ref 13.0–17.0)
MCH: 29.3 pg (ref 26.0–34.0)
MCHC: 32.6 g/dL (ref 30.0–36.0)
MCV: 89.9 fL (ref 78.0–100.0)
Platelets: 195 10*3/uL (ref 150–400)
RBC: 3.68 MIL/uL — AB (ref 4.22–5.81)
RDW: 16.2 % — ABNORMAL HIGH (ref 11.5–15.5)
WBC: 10.6 10*3/uL — AB (ref 4.0–10.5)

## 2018-03-21 LAB — BASIC METABOLIC PANEL
ANION GAP: 12 (ref 5–15)
BUN: 26 mg/dL — ABNORMAL HIGH (ref 6–20)
CHLORIDE: 103 mmol/L (ref 101–111)
CO2: 17 mmol/L — ABNORMAL LOW (ref 22–32)
Calcium: 8.8 mg/dL — ABNORMAL LOW (ref 8.9–10.3)
Creatinine, Ser: 0.96 mg/dL (ref 0.61–1.24)
Glucose, Bld: 205 mg/dL — ABNORMAL HIGH (ref 65–99)
Potassium: 5.2 mmol/L — ABNORMAL HIGH (ref 3.5–5.1)
SODIUM: 132 mmol/L — AB (ref 135–145)

## 2018-03-21 LAB — CULTURE, BODY FLUID W GRAM STAIN -BOTTLE: Culture: NO GROWTH

## 2018-03-21 LAB — CULTURE, BODY FLUID-BOTTLE

## 2018-03-21 NOTE — Progress Notes (Signed)
Patient refusing stool softener at this time. Pt unable to tell RN last time he has had a bowel movement as he does not remember. Patient refusing mouth wash at this time although encouraged to take as this will help with the sores in his mouth. Patient refusing protein supplement at this time as well.

## 2018-03-21 NOTE — Progress Notes (Signed)
OT Cancellation Note  Patient Details Name: Kristopher Johnson MRN: [id number] DOB: Jan 28, 1956   Cancelled Treatment:    Reason Eval/Treat Not Completed: Fatigue/lethargy limiting ability to participate  Daughter present and Aunt on speaker phone to listen to OT conversation with pt  Pt refused therapy due to pain and fatigue.    Communicated OT would check back on pt as schedule allowed  [facility], [place], [place] [phone] 03/21/2018, 12:19 PM

## 2018-03-21 NOTE — Progress Notes (Signed)
RT attempted to administer scheduled breathing tx. Patient refusing to wear throughout the entire tx and asking for water multiple times. Patient given water, but would not put mask back on. RT will continue to monitor patient.

## 2018-03-21 NOTE — Progress Notes (Signed)
PROGRESS NOTE [facility]   [name]   YBO:[id number] DOB: Jul 11, 1956  DOA: 02/22/2018 PCP: Patient, No Pcp Per   Brief Narrative:  Kristopher Johnson is an 62 y.o. 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  Radiation oncology was consulted and recommended 2 weeks of elective radiation.  We consulted hospice and palliative care and patient and would like to continue aggressive treatment.  The patient subsequently developed a sepsis-like picture, he was started empirically on IV vancomycin and cefepime and his picture seems to be improving, his remained afebrile, his leukocytosis is improving. He is also having his Pleurx catheter drain twice a day. He was tried on oral hydration and he was not able to keep up with the losses, therefore he was started on IV fluids, now his acute renal failure has improved his hypotension and tachycardia have also improved.  Subjective: Patient seen and examined, he is more awake today.  Having hard time breathing.  This morning drained 1 L from Pleurx.  Assessment & Plan: Mantle cell lymphoma of lymph nodes of multiple regions Oncology recommendations appreciated, patient on ibrutinib, rituxan q 3 weeks. Holding Venetoclax  Palliative care consulted and counseled on patient DNR, however patient wishes to continue aggressive treatment.  Attempting palliative radiation for 2 weeks in hopes of reducing effusion.  No signs of tumor lysis syndrome.  Oncology recommended optimization of pleural fluid, along with nutritional status in order to reconsider  palliative systemic therapy in the future, SIRS picture has resolved.  Out of bed as tolerated, encourage oral nutrition and hydration. Will stop IVF today to see if can keep up with oral hydration. Patient not candidate for LTACH. Will continue to optimize with goal of get patient home with home health. Continue supportive treatment. Patient is candidate for hospice, however he does not feel ready yet.   Recurrent malignant pleural effusion on the left Continue to drain twice daily Hopefully radiation therapy help to improve pleural effusion  Acute kidney injury - resolved  Prerenal related to pleural fluid losses and decreased oral intake Creatinine has improved, encourage oral hydration.  D/c IVF  Continue to monitor renal function  K border line high, will monitor for now   Leukocytosis - improved  No signs of sepsis, no source of infection.  No fever Patient was started empirically on IV vancomycin and Zosyn, blood cultures negative so far. MRSA PCR negative. WBC continues to improve. Continue to monitor off abx   Orthostatic hypotension From dehydration. Continue oral hydration  Dysphagia Continue Diflucan Continue Magic mouth wash  DVT prophylaxis: Lovenox Code Status: DNR Family Communication: None at bedside Disposition Plan: Home hopefully in 3-4 days   Consultants:   Oncology  Radiation oncology  Palliative care  Procedures:   Radiation therapy  Antimicrobials: Anti-infectives (From admission, onward)   Start     Dose/Rate Route Frequency Ordered Stop   03/18/18 1000  fluconazole (DIFLUCAN) tablet 100 mg     100 mg Oral Daily 03/18/18 0743     03/16/18 1600  fluconazole (DIFLUCAN) IVPB 100 mg  Status:  Discontinued     100 mg 50 mL/hr over 60  Minutes Intravenous Every 24 hours 03/16/18 1431 03/18/18 0743   03/15/18 2200  vancomycin (VANCOCIN) 1,250 mg in sodium chloride 0.9 % 250 mL IVPB  Status:  Discontinued     1,250 mg 166.7 mL/hr over 90 Minutes  Intravenous Every 24 hours 03/14/18 1614 03/18/18 1310   03/14/18 2200  vancomycin (VANCOCIN) IVPB 750 mg/150 ml premix     750 mg 150 mL/hr over 60 Minutes Intravenous  Once 03/14/18 1614 03/14/18 2300   03/14/18 2200  piperacillin-tazobactam (ZOSYN) IVPB 3.375 g  Status:  Discontinued     3.375 g 12.5 mL/hr over 240 Minutes Intravenous Every 8 hours 03/14/18 1614 03/20/18 0750   03/14/18 1515  vancomycin (VANCOCIN) IVPB 1000 mg/200 mL premix     1,000 mg 200 mL/hr over 60 Minutes Intravenous  Once 03/14/18 1506 03/14/18 1644   03/14/18 1515  piperacillin-tazobactam (ZOSYN) IVPB 3.375 g     3.375 g 100 mL/hr over 30 Minutes Intravenous  Once 03/14/18 1506 03/14/18 1614        Objective: Vitals:   03/21/18 0801 03/21/18 0808 03/21/18 1254 03/21/18 1423  BP:  (!) 108/58  111/68  Pulse: (!) 120 (!) 120 (!) 125 (!) 134  Resp: (!) 22  (!) 26 (!) 22  Temp:    98 F (36.7 C)  TempSrc:    Oral  SpO2: 95%  93% 94%  Weight:      Height:        Intake/Output Summary (Last 24 hours) at 03/21/2018 1555 Last data filed at 03/21/2018 1500 Gross per 24 hour  Intake 900 ml  Output 1375 ml  Net -475 ml   Filed Weights   03/19/18 0524 03/20/18 0456 03/21/18 0510  Weight: 73.7 kg (162 lb 6.4 oz) 75.7 kg (166 lb 14.2 oz) 72 kg (158 lb 11.7 oz)    Examination:   General: Mild respiratory distress  Cardiovascular: Tachycardia, S1/S2 +, no rubs, no gallops Respiratory: Decrease BS on the left, mild expiratory wheezing  Abdominal: Soft, NT, ND, bowel sounds + Extremities: no edema   Data Reviewed: I have personally reviewed following labs and imaging studies  CBC: Recent Labs  Lab 03/15/18 0347 03/16/18 0424 03/19/18 0502 03/20/18 0444 03/21/18 0508  WBC 18.7* 15.1* 12.8* 11.7* 10.6*  HGB 12.8* 11.8* 11.1* 11.1* 10.8*  HCT 38.6* 34.8* 34.4* 34.2* 33.1*  MCV 86.9 86.4 89.4 87.9 89.9  PLT 189 153 183 197 527   Basic Metabolic Panel: Recent Labs  Lab 03/16/18 0424  03/17/18 0341 03/18/18 0415 03/18/18 0915 03/18/18 1545 03/19/18 0502 03/20/18 0444 03/21/18 0508  NA 130* 134* 132*  --  133*  --  134* 132*  K 4.5 4.6 5.8*  --  4.6  --  4.8 5.2*  CL 94* 100* 100*  --  101  --  103 103  CO2 27 26 20*  --  21*  --  20* 17*  GLUCOSE 198* 164* 178*  --  161*  --  141* 205*  BUN 27* 21* 22*  --  21*  --  22* 26*  CREATININE 1.09 0.84 1.04  --  0.92 0.95 0.91 0.96  CALCIUM 8.0* 8.0* 8.1*  --  8.1*  --  8.5* 8.8*  MG  --   --   --   --   --   --  2.0  --   PHOS 2.8  --   --  3.7  --   --   --   --  GFR: Estimated Creatinine Clearance: 77.7 mL/min (by C-G formula based on SCr of 0.96 mg/dL). Liver Function Tests: Recent Labs  Lab 03/15/18 0347  AST 19  ALT 14*  ALKPHOS 69  BILITOT 1.2  PROT 4.9*  ALBUMIN 2.4*   No results for input(s): LIPASE, AMYLASE in the last 168 hours. No results for input(s): AMMONIA in the last 168 hours. Coagulation Profile: No results for input(s): INR, PROTIME in the last 168 hours. Cardiac Enzymes: No results for input(s): CKTOTAL, CKMB, CKMBINDEX, TROPONINI in the last 168 hours. BNP (last 3 results) No results for input(s): PROBNP in the last 8760 hours. HbA1C: No results for input(s): HGBA1C in the last 72 hours. CBG: No results for input(s): GLUCAP in the last 168 hours. Lipid Profile: No results for input(s): CHOL, HDL, LDLCALC, TRIG, CHOLHDL, LDLDIRECT in the last 72 hours. Thyroid Function Tests: No results for input(s): TSH, T4TOTAL, FREET4, T3FREE, THYROIDAB in the last 72 hours. Anemia Panel: No results for input(s): VITAMINB12, FOLATE, FERRITIN, TIBC, IRON, RETICCTPCT in the last 72 hours. Sepsis Labs: No results for input(s): PROCALCITON, LATICACIDVEN in the last 168 hours.  Recent Results (from the past 240 hour(s))  Culture, blood (Routine X 2) w Reflex to ID Panel     Status: None   Collection Time: 03/14/18  3:12 PM  Result Value Ref Range Status   Specimen Description   Final    BLOOD  LEFT ANTECUBITAL Performed at [facility] [address] [place], [place] [address]    Special Requests   Final    BOTTLES DRAWN AEROBIC ONLY Blood Culture adequate volume Performed at [facility] [address], [place], [place] [address]    Culture   Final    NO GROWTH 5 DAYS Performed at [facility], [place] [address] [place], [place] [address]    Report Status 03/19/2018 FINAL  Final  Culture, blood (Routine X 2) w Reflex to ID Panel     Status: None   Collection Time: 03/14/18  3:13 PM  Result Value Ref Range Status   Specimen Description   Final    BLOOD RIGHT ANTECUBITAL Performed at [facility] [address] [place], [place] [address]    Special Requests   Final    BOTTLES DRAWN AEROBIC ONLY Blood Culture adequate volume Performed at [facility] [address] [place], [place] [address]    Culture   Final    NO GROWTH 5 DAYS Performed at [facility], [place] [address], [place], [place] [address]    Report Status 03/19/2018 FINAL  Final  Culture, body fluid-bottle     Status: None   Collection Time: 03/15/18 10:52 AM  Result Value Ref Range Status   Specimen Description FLUID LEFT PLEURAL  Final   Special Requests BOTTLES DRAWN AEROBIC AND ANAEROBIC  Final   Culture   Final    NO GROWTH 5 DAYS Performed at [facility], [place] [address] [place], [place] [address]    Report Status 03/21/2018 FINAL  Final  Gram stain     Status: None   Collection Time: 03/15/18 10:52 AM  Result Value Ref Range Status   Specimen Description FLUID LEFT PLEURAL  Final   Special Requests NONE  Final   Gram Stain   Final    MODERATE WBC PRESENT, PREDOMINANTLY MONONUCLEAR NO ORGANISMS SEEN Performed at [facility], [place] [address] [place], [place] [address]  Report Status 03/16/2018 FINAL  Final  MRSA PCR Screening     Status: None   Collection Time: 03/17/18  9:23 AM   Result Value Ref Range Status   MRSA by PCR NEGATIVE NEGATIVE Final    Comment:        The GeneXpert MRSA Assay (FDA approved for NASAL specimens only), is one component of a comprehensive MRSA colonization surveillance program. It is not intended to diagnose MRSA infection nor to guide or monitor treatment for MRSA infections. Performed at [facility], [place] [address], [place], [place] [address]       Radiology Studies: No results found.    Scheduled Meds: . chlorhexidine  15 mL Mouth Rinse BID  . enoxaparin (LOVENOX) injection  40 mg Subcutaneous Q24H  . feeding supplement  1 Container Oral BID BM  . fluconazole  100 mg Oral Daily  . ipratropium-albuterol  3 mL Nebulization TID  . magic mouthwash w/lidocaine  5 mL Oral TID  . mouth rinse  15 mL Mouth Rinse q12n4p  . mometasone   Topical Daily  . multivitamin with minerals  1 tablet Oral Daily  . polyethylene glycol  17 g Oral Daily  . protein supplement shake  11 oz Oral BID BM  . senna-docusate  1 tablet Oral BID   Continuous Infusions:    LOS: 14 days    Time spent: Total of 25 minutes spent with pt, greater than 50% of which was spent in discussion of  treatment, counseling and coordination of care   Chipper Oman, MD Pager: Text Page via [email or web address]   If 7PM-7AM, please contact night-coverage [email or web address] 03/21/2018, 3:55 PM   Note - This record has been created using [employer]. Chart creation errors have been sought, but may not always have been located. Such creation errors do not reflect on the standard of medical care.

## 2018-03-21 NOTE — Progress Notes (Signed)
PT Cancellation Note  Patient Details Name: Kristopher Johnson MRN: [id number] DOB: [birth year]   Cancelled Treatment:    Reason Eval/Treat Not Completed: Patient declined, no reason specified   Weston Anna, MPT Pager: [phone]

## 2018-03-21 NOTE — Plan of Care (Signed)
  Problem: Elimination: Goal: Will not experience complications related to urinary retention Outcome: Progressing   Problem: Skin Integrity: Goal: Risk for impaired skin integrity will decrease Outcome: Progressing   

## 2018-03-22 LAB — BASIC METABOLIC PANEL
ANION GAP: 11 (ref 5–15)
BUN: 29 mg/dL — AB (ref 6–20)
CALCIUM: 9.3 mg/dL (ref 8.9–10.3)
CO2: 19 mmol/L — ABNORMAL LOW (ref 22–32)
Chloride: 103 mmol/L (ref 101–111)
Creatinine, Ser: 0.87 mg/dL (ref 0.61–1.24)
GFR calc Af Amer: >60 mL/min (ref >60)
GLUCOSE: 177 mg/dL — AB (ref 65–99)
POTASSIUM: 5 mmol/L (ref 3.5–5.1)
SODIUM: 133 mmol/L — AB (ref 135–145)

## 2018-03-22 LAB — MAGNESIUM: Magnesium: 2.1 mg/dL (ref 1.7–2.4)

## 2018-03-22 NOTE — Progress Notes (Signed)
Occupational Therapy Treatment Patient Details Name: TRISTAIN DAILY MRN: [id number] DOB: 11-Jun-1956 Today's Date: 03/22/2018    History of present illness 62 year old man was admitted with dizziness; SOB with exertion.  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   OT comments  This 62 yo male admitted with above presents to acute OT today with no c/o dizziness with change in position only DOE. He was able to make it out of bed today to recliner. He will continue to benefit from acute OT.  Supine: BP 108/57               HR 116               O2 97 on 3 liters  Sitting: BP 117/69             HR 120             O2 97 on 3 liters  Standing: BP 106/85                 HR 116                 O2 96 on 3 liters.   Follow Up Recommendations  Supervision/Assistance - 24 hour    Equipment Recommendations  3 in 1 bedside commode       Precautions / Restrictions Precautions Precautions: Fall Precaution Comments: has orthostatic, watch HR Restrictions Weight Bearing Restrictions: No       Mobility Bed Mobility Overal bed mobility: Needs Assistance Bed Mobility: Supine to Sit     Supine to sit: Supervision        Transfers Overall transfer level: Needs assistance Equipment used: Rolling walker (2 wheeled) Transfers: Sit to/from Stand Sit to Stand: Min assist         General transfer comment: VCs for safe hand placement    Balance Overall balance assessment: Needs assistance Sitting-balance support: No upper extremity supported;Feet supported Sitting balance-Leahy Scale: Good     Standing balance support: Bilateral upper extremity supported Standing balance-Leahy Scale: Fair Standing balance comment: reliant on RW for safety                           ADL either performed or assessed with clinical judgement   ADL Overall ADL's : Needs assistance/impaired                     Lower Body Dressing: Set up;Supervision/safety;Bed level Lower Body Dressing Details (indicate cue type and reason): don socks in circle sitting Toilet Transfer: Minimal assistance;Stand-pivot;RW   Toileting- Clothing Manipulation and Hygiene: Minimal assistance;Sit to/from stand               Vision Baseline Vision/History: Legally blind            Cognition Arousal/Alertness: Awake/alert Behavior During Therapy: Flat affect Overall Cognitive Status: Within Functional Limits for tasks assessed                                                     Pertinent Vitals/ Pain       Pain Assessment: No/denies pain         Frequency  Min 2X/week  Progress Toward Goals  OT Goals(current goals can now be found in the care plan section)  Progress towards OT goals: Progressing toward goals     Plan Discharge plan remains appropriate       AM-PAC PT "6 Clicks" Daily Activity     Outcome Measure   Help from another person eating meals?: None Help from another person taking care of personal grooming?: A Little Help from another person toileting, which includes using toliet, bedpan, or urinal?: A Little Help from another person bathing (including washing, rinsing, drying)?: A Little Help from another person to put on and taking off regular upper body clothing?: A Little Help from another person to put on and taking off regular lower body clothing?: A Little 6 Click Score: 19    End of Session Equipment Utilized During Treatment: Gait belt;Rolling walker  OT Visit Diagnosis: Unsteadiness on feet (R26.81);Muscle weakness (generalized) (M62.81)   Activity Tolerance (limited by SOB)   Patient Left in chair;with call bell/phone within reach;with chair alarm set   Nurse Communication Mobility status        Time: 1517-6160 OT Time Calculation (min): 31 min  Charges: OT General Charges $OT Visit: 1 Visit OT Treatments $Self Care/Home  Management : 23-37 mins  [place] [address], OTR/L [phone] 03/22/2018

## 2018-03-22 NOTE — Progress Notes (Signed)
  Radiation Oncology         [phone] ________________________________  Name: Kristopher Johnson MRN: [id number]  Date: 03/12/2018  DOB: 1956-02-24  SIMULATION AND TREATMENT PLANNING NOTE  DIAGNOSIS:     ICD-10-CM   1. Mantle cell lymphoma of lymph nodes of multiple regions [facility]) C83.18      Site:  Left lung  NARRATIVE:  The patient was brought to the [facility].  Identity was confirmed.  All relevant records and images related to the planned course of therapy were reviewed.   Written consent to proceed with treatment was confirmed which was freely given after reviewing the details related to the planned course of therapy had been reviewed with the patient.  Then, the patient was set-up in a stable reproducible  supine position for radiation therapy.  CT images were obtained.  Surface markings were placed.    immobilization:   wing board.   The CT images were loaded into the planning software.  Then the target and avoidance structures were contoured.  Treatment planning then occurred.  The radiation prescription was entered and confirmed.  A total of 2 complex treatment devices were fabricated which relate to the designed radiation treatment fields. Each of these customized fields/ complex treatment devices will be used on a daily basis during the radiation course. I have requested : 3D Simulation  I have requested a DVH of the following structures: target volume, lungs, spinal cord.   The patient will undergo daily image guidance to ensure accurate localization of the target, and adequate minimize dose to the normal surrounding structures in close proximity to the target.   PLAN:  The patient will receive 18 Gy in 10 fractions.  ________________________________   Jodelle Gross, MD, PhD

## 2018-03-22 NOTE — Progress Notes (Signed)
PROGRESS NOTE [facility]   Kristopher Johnson   FOY:[id number] DOB: Oct 10, 1955  DOA: 02/17/2018 PCP: Patient, No Pcp Per   Brief Narrative:  Kristopher Johnson is an 63 y.o. 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  Radiation oncology was consulted and recommended 2 weeks of elective radiation.  We consulted hospice and palliative care and patient and would like to continue aggressive treatment.  The patient subsequently developed a sepsis-like picture, he was started empirically on IV vancomycin and cefepime and his picture seems to be improving, his remained afebrile, his leukocytosis is improving. He is also having his Pleurx catheter drain TID. He was tried on oral hydration and he was not able to keep up with the losses, therefore he was started on IV fluids, now his acute renal failure has improved his hypotension and tachycardia have also improved.  Subjective: Patient seen and examined, had a rough night last night.  Was unable to sleep.  Slight more short of breath today.  Pleurx drain 850 this morning and 1 L last night.  Assessment & Plan: Mantle cell lymphoma of lymph nodes of multiple regions Oncology recommendations appreciated, patient on ibrutinib, rituxan q 3 weeks. Holding Venetoclax  Palliative care consulted and counseled on patient DNR, however patient wishes to continue aggressive treatment.  Attempting palliative radiation for 2 weeks in hopes of reducing effusion.  No signs of tumor lysis syndrome.  Oncology recommended optimization of pleural fluid, along with nutritional  status in order to reconsider palliative systemic therapy in the future, SIRS picture has resolved.  Out of bed as tolerated, encourage oral nutrition and hydration.  Patient now holding on with nutrition intake and hydration.  Discussed hospice care, patient will think about it.  We will continue supportive treatment  Recurrent malignant pleural effusion on the left Will increase draining time to 3 times daily Continue palliative radiotherapy  Acute kidney injury - resolved  Prerenal related to pleural fluid losses and decreased oral intake Creatinine has improved, encourage oral hydration.  Check renal function in a.m.  Leukocytosis - improved   No signs of sepsis, no source of infection.  No fever Patient was started empirically on IV vancomycin and Zosyn, blood cultures negative so far. MRSA PCR negative.  Need to monitor off antibiotics  Orthostatic hypotension From dehydration. Continue oral hydration  Dysphagia Continue Diflucan Continue Magic mouth wash  DVT prophylaxis: Lovenox Code Status: DNR Family Communication: None at bedside Disposition Plan: Home hopefully in 2-3 day if able to keep hydration  Consultants:   Oncology  Radiation oncology  Palliative care  Procedures:   Radiation therapy  Antimicrobials: Anti-infectives (From admission, onward)   Start     Dose/Rate Route Frequency Ordered Stop   03/18/18 1000  fluconazole (DIFLUCAN) tablet 100 mg     100 mg Oral Daily 03/18/18 0743     03/16/18 1600  fluconazole (DIFLUCAN) IVPB 100 mg  Status:  Discontinued     100 mg 50 mL/hr over 60 Minutes Intravenous Every 24 hours 03/16/18 1431 03/18/18 0743   03/15/18 2200  vancomycin (VANCOCIN) 1,250 mg in sodium chloride 0.9 % 250 mL IVPB  Status:  Discontinued     1,250 mg 166.7 mL/hr over 90 Minutes Intravenous Every 24 hours 03/14/18 1614 03/18/18 1310   03/14/18 2200  vancomycin (VANCOCIN) IVPB 750 mg/150 ml premix     750 mg 150 mL/hr over 60 Minutes  Intravenous  Once 03/14/18 1614 03/14/18 2300   03/14/18 2200  piperacillin-tazobactam (ZOSYN) IVPB 3.375 g  Status:  Discontinued     3.375 g 12.5 mL/hr over 240 Minutes Intravenous Every 8 hours 03/14/18 1614 03/20/18 0750   03/14/18 1515  vancomycin (VANCOCIN) IVPB 1000 mg/200 mL premix     1,000 mg 200 mL/hr over 60 Minutes Intravenous  Once 03/14/18 1506 03/14/18 1644   03/14/18 1515  piperacillin-tazobactam (ZOSYN) IVPB 3.375 g     3.375 g 100 mL/hr over 30 Minutes Intravenous  Once 03/14/18 1506 03/14/18 1614        Objective: Vitals:   03/21/18 2306 03/21/18 2317 03/22/18 0432 03/22/18 0636  BP:   (!) 106/55   Pulse:   (!) 115   Resp:   20   Temp:   (!) 97.4 F (36.3 C)   TempSrc:   Axillary   SpO2: 99% 100% 96%   Weight:    75.9 kg (167 lb 5.3 oz)  Height:        Intake/Output Summary (Last 24 hours) at 03/22/2018 0952 Last data filed at 03/22/2018 0900 Gross per 24 hour  Intake 240 ml  Output 2640 ml  Net -2400 ml   Filed Weights   03/20/18 0456 03/21/18 0510 03/22/18 0636  Weight: 75.7 kg (166 lb 14.2 oz) 72 kg (158 lb 11.7 oz) 75.9 kg (167 lb 5.3 oz)    Examination:   General: Ill-looking, cachectic Cardiovascular: RRR, S1/S2 +, no rubs, no gallops Respiratory: Breath sounds diminished on the left, no wheezing Abdominal: Soft, NT, ND, bowel sounds + Extremities: no edema, no cyanosis   Data Reviewed: I have personally reviewed following labs and imaging studies  CBC: Recent Labs  Lab 03/16/18 0424 03/19/18 0502 03/20/18 0444 03/21/18 0508  WBC 15.1* 12.8* 11.7* 10.6*  HGB 11.8* 11.1* 11.1* 10.8*  HCT 34.8* 34.4* 34.2* 33.1*  MCV 86.4 89.4 87.9 89.9  PLT 153 183 197 350   Basic Metabolic Panel: Recent Labs  Lab 03/16/18 0424  03/18/18 0415 03/18/18 0915 03/18/18 1545 03/19/18 0502 03/20/18 0444 03/21/18 0508 03/22/18 0327  NA 130*   < > 132*  --  133*  --  134* 132* 133*  K 4.5   < > 5.8*  --  4.6  --  4.8 5.2* 5.0  CL 94*   < >  100*  --  101  --  103 103 103  CO2 27   < > 20*  --  21*  --  20* 17* 19*  GLUCOSE 198*   < > 178*  --  161*  --  141* 205* 177*  BUN 27*   < > 22*  --  21*  --  22* 26* 29*  CREATININE 1.09   < > 1.04  --  0.92 0.95 0.91 0.96 0.87  CALCIUM 8.0*   < > 8.1*  --  8.1*  --  8.5* 8.8* 9.3  MG  --   --   --   --   --   --  2.0  --  2.1  PHOS 2.8  --   --  3.7  --   --   --   --   --    < > =  values in this interval not displayed.   GFR: Estimated Creatinine Clearance: 85.8 mL/min (by C-G formula based on SCr of 0.87 mg/dL). Liver Function Tests: No results for input(s): AST, ALT, ALKPHOS, BILITOT, PROT, ALBUMIN in the last 168 hours. No results for input(s): LIPASE, AMYLASE in the last 168 hours. No results for input(s): AMMONIA in the last 168 hours. Coagulation Profile: No results for input(s): INR, PROTIME in the last 168 hours. Cardiac Enzymes: No results for input(s): CKTOTAL, CKMB, CKMBINDEX, TROPONINI in the last 168 hours. BNP (last 3 results) No results for input(s): PROBNP in the last 8760 hours. HbA1C: No results for input(s): HGBA1C in the last 72 hours. CBG: No results for input(s): GLUCAP in the last 168 hours. Lipid Profile: No results for input(s): CHOL, HDL, LDLCALC, TRIG, CHOLHDL, LDLDIRECT in the last 72 hours. Thyroid Function Tests: No results for input(s): TSH, T4TOTAL, FREET4, T3FREE, THYROIDAB in the last 72 hours. Anemia Panel: No results for input(s): VITAMINB12, FOLATE, FERRITIN, TIBC, IRON, RETICCTPCT in the last 72 hours. Sepsis Labs: No results for input(s): PROCALCITON, LATICACIDVEN in the last 168 hours.  Recent Results (from the past 240 hour(s))  Culture, blood (Routine X 2) w Reflex to ID Panel     Status: None   Collection Time: 03/14/18  3:12 PM  Result Value Ref Range Status   Specimen Description   Final    BLOOD LEFT ANTECUBITAL Performed at [facility] [address] [place], [place] [address]    Special Requests    Final    BOTTLES DRAWN AEROBIC ONLY Blood Culture adequate volume Performed at [facility] [address], [place], [place] [address]    Culture   Final    NO GROWTH 5 DAYS Performed at [facility], [place] [address] [place], [place] [address]    Report Status 03/19/2018 FINAL  Final  Culture, blood (Routine X 2) w Reflex to ID Panel     Status: None   Collection Time: 03/14/18  3:13 PM  Result Value Ref Range Status   Specimen Description   Final    BLOOD RIGHT ANTECUBITAL Performed at [facility] [address] [place], [place] [address]    Special Requests   Final    BOTTLES DRAWN AEROBIC ONLY Blood Culture adequate volume Performed at [facility] [address], [place], [place] [address]    Culture   Final    NO GROWTH 5 DAYS Performed at [facility], [place] [address] [place], [place] [address]    Report Status 03/19/2018 FINAL  Final  Culture, body fluid-bottle     Status: None   Collection Time: 03/15/18 10:52 AM  Result Value Ref Range Status   Specimen Description FLUID LEFT PLEURAL  Final   Special Requests BOTTLES DRAWN AEROBIC AND ANAEROBIC  Final   Culture   Final    NO GROWTH 5 DAYS Performed at [facility], [place] [address], [place], [place] [address]    Report Status 03/21/2018 FINAL  Final  Gram stain     Status: None   Collection Time: 03/15/18 10:52 AM  Result Value Ref Range Status   Specimen Description FLUID LEFT PLEURAL  Final   Special Requests NONE  Final   Gram Stain   Final    MODERATE WBC PRESENT, PREDOMINANTLY MONONUCLEAR NO ORGANISMS SEEN Performed at [facility], [place] [address], [place], [place] [address]    Report  Status 03/16/2018 FINAL  Final  MRSA PCR Screening     Status: None   Collection Time: 03/17/18  9:23 AM  Result Value Ref Range Status   MRSA by PCR NEGATIVE NEGATIVE Final    Comment:        The GeneXpert MRSA Assay (FDA approved for  NASAL specimens only), is one component of a comprehensive MRSA colonization surveillance program. It is not intended to diagnose MRSA infection nor to guide or monitor treatment for MRSA infections. Performed at [facility], [place] [address], [place], [place] [address]       Radiology Studies: No results found.    Scheduled Meds: . chlorhexidine  15 mL Mouth Rinse BID  . enoxaparin (LOVENOX) injection  40 mg Subcutaneous Q24H  . feeding supplement  1 Container Oral BID BM  . fluconazole  100 mg Oral Daily  . ipratropium-albuterol  3 mL Nebulization TID  . magic mouthwash w/lidocaine  5 mL Oral TID  . mouth rinse  15 mL Mouth Rinse q12n4p  . mometasone   Topical Daily  . multivitamin with minerals  1 tablet Oral Daily  . polyethylene glycol  17 g Oral Daily  . protein supplement shake  11 oz Oral BID BM  . senna-docusate  1 tablet Oral BID   Continuous Infusions:    LOS: 15 days    Time spent: Total of 25 minutes spent with pt, greater than 50% of which was spent in discussion of  treatment, counseling and coordination of care   Chipper Oman, MD Pager: Text Page via [email or web address]   If 7PM-7AM, please contact night-coverage [email or web address] 03/22/2018, 9:52 AM   Note - This record has been created using [employer]. Chart creation errors have been sought, but may not always have been located. Such creation errors do not reflect on the standard of medical care.

## 2018-03-22 NOTE — Plan of Care (Signed)
  Problem: Health Behavior/Discharge Planning: Goal: Ability to manage health-related needs will improve Outcome: Progressing   Problem: Elimination: Goal: Will not experience complications related to urinary retention Outcome: Progressing   Problem: Skin Integrity: Goal: Risk for impaired skin integrity will decrease Outcome: Progressing

## 2018-03-23 LAB — BASIC METABOLIC PANEL
Anion gap: 10 (ref 5–15)
BUN: 28 mg/dL — AB (ref 6–20)
CO2: 20 mmol/L — ABNORMAL LOW (ref 22–32)
CREATININE: 0.67 mg/dL (ref 0.61–1.24)
Calcium: 9.3 mg/dL (ref 8.9–10.3)
Chloride: 104 mmol/L (ref 101–111)
GFR calc Af Amer: >60 mL/min (ref >60)
GFR calc non Af Amer: >60 mL/min (ref >60)
Glucose, Bld: 182 mg/dL — ABNORMAL HIGH (ref 65–99)
POTASSIUM: 5 mmol/L (ref 3.5–5.1)
SODIUM: 134 mmol/L — AB (ref 135–145)

## 2018-03-23 LAB — MAGNESIUM: MAGNESIUM: 2.2 mg/dL (ref 1.7–2.4)

## 2018-03-23 MED ORDER — SODIUM CHLORIDE 0.9 % IV BOLUS
1000.0000 mL | Freq: Once | INTRAVENOUS | Status: AC
  Administered 2018-03-23: 1000 mL via INTRAVENOUS

## 2018-03-23 MED ORDER — ACETAMINOPHEN 500 MG PO TABS: 1000.0000 mg | ORAL_TABLET | Freq: Four times a day (QID) | ORAL | Status: DC | PRN

## 2018-03-23 MED ORDER — MORPHINE SULFATE ER 30 MG PO TBCR
30.0000 mg | EXTENDED_RELEASE_TABLET | Freq: Two times a day (BID) | ORAL | Status: DC
  Administered 2018-03-23 – 2018-03-25 (×5): 30 mg via ORAL
  Filled 2018-03-23 (×5): qty 1

## 2018-03-23 MED ORDER — OXYCODONE HCL 5 MG PO TABS
10.0000 mg | ORAL_TABLET | ORAL | Status: DC | PRN
  Administered 2018-03-23 – 2018-03-25 (×6): 10 mg via ORAL
  Filled 2018-03-23 (×6): qty 2

## 2018-03-23 NOTE — Progress Notes (Signed)
PROGRESS NOTE [facility]   Kristopher Johnson   BDZ:[id number] DOB: September 06, 1956  DOA: 02/13/2018 PCP: Patient, No Pcp Per   Brief Narrative:  Kristopher Johnson is an 62 y.o. 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  Radiation oncology was consulted and recommended 2 weeks of elective radiation.  We consulted hospice and palliative care and patient and would like to continue aggressive treatment.  The patient subsequently developed a sepsis-like picture, he was started empirically on IV vancomycin and cefepime and his picture seems to be improving, his remained afebrile, his leukocytosis is improving. He is also having his Pleurx catheter drain TID. He was tried on oral hydration and he was not able to keep up with the losses, therefore he was started on IV fluids, now his acute renal failure has improved his hypotension and tachycardia have also improved.  Subjective: Patient seen and examined, he is in more pain today and slight confused today.  He has received multiple doses of morphine.  Now his blood pressure is low.  Breathing about the same.  Per nursing staff refused Pleurx to be drained last night.  Assessment & Plan: Mantle cell lymphoma of lymph nodes of multiple regions Oncology recommendations appreciated, patient on ibrutinib, rituxan q 3 weeks. Holding Venetoclax  Palliative care consulted patient is DNR, recommended hospice care, however patient wishes to continue aggressive treatment.  Attempting palliative radiation for 2 weeks in hopes of reducing effusion and improve  respiratory status. No signs of tumor lysis syndrome.  Oncology recommended optimization of pleural fluid, along with nutritional status in order to reconsider palliative systemic therapy in the future, SIRS picture has resolved.  Continue to encourage physical activity out of bed as tolerated, encourage oral nutrition and hydration. Discussed hospice care, with patient, however he refuse.  Continue supportive treatment.  Cancer related pain Adjusting pain regimen, will start MS Contin 30 mg twice daily, OxyContin IR 10 mg every 3 hours for breakthrough pain and Tylenol thousand milligrams every 6 hours as needed.  Discontinue IV morphine as patient is tolerating p.o. and blood pressure has been low.  Recurrent malignant pleural effusion on the left Continue to drain 3 times daily Continue palliative radiotherapy  Acute kidney injury - resolved  Prerenal related to pleural fluid losses and decreased oral intake Function remains stable, patient tolerating oral diet well.  Leukocytosis - improved   No signs of sepsis, no source of infection.  No fever Patient was started empirically on IV vancomycin and Zosyn, blood cultures negative so far. MRSA PCR negative.  Continue to monitor off antibiotics  Orthostatic hypotension From dehydration. Continue oral hydration  Dysphagia Continue Diflucan x 7 days  Continue Magic mouth wash  DVT prophylaxis: Lovenox Code Status: DNR Family Communication: None at bedside Disposition Plan: Home hopefully in 2-3 day if able to keep hydration  Consultants:   Oncology  Radiation oncology  Palliative care  Procedures:   Radiation therapy  Antimicrobials: Anti-infectives (From admission, onward)   Start     Dose/Rate Route Frequency Ordered Stop   03/18/18 1000  fluconazole (DIFLUCAN) tablet 100 mg     100 mg Oral Daily  03/18/18 0743     03/16/18 1600  fluconazole (DIFLUCAN) IVPB 100 mg  Status:  Discontinued     100 mg 50 mL/hr over 60  Minutes Intravenous Every 24 hours 03/16/18 1431 03/18/18 0743   03/15/18 2200  vancomycin (VANCOCIN) 1,250 mg in sodium chloride 0.9 % 250 mL IVPB  Status:  Discontinued     1,250 mg 166.7 mL/hr over 90 Minutes Intravenous Every 24 hours 03/14/18 1614 03/18/18 1310   03/14/18 2200  vancomycin (VANCOCIN) IVPB 750 mg/150 ml premix     750 mg 150 mL/hr over 60 Minutes Intravenous  Once 03/14/18 1614 03/14/18 2300   03/14/18 2200  piperacillin-tazobactam (ZOSYN) IVPB 3.375 g  Status:  Discontinued     3.375 g 12.5 mL/hr over 240 Minutes Intravenous Every 8 hours 03/14/18 1614 03/20/18 0750   03/14/18 1515  vancomycin (VANCOCIN) IVPB 1000 mg/200 mL premix     1,000 mg 200 mL/hr over 60 Minutes Intravenous  Once 03/14/18 1506 03/14/18 1644   03/14/18 1515  piperacillin-tazobactam (ZOSYN) IVPB 3.375 g     3.375 g 100 mL/hr over 30 Minutes Intravenous  Once 03/14/18 1506 03/14/18 1614        Objective: Vitals:   03/23/18 0530 03/23/18 0622 03/23/18 0744 03/23/18 0800  BP:  (!) 89/54  (!) 109/91  Pulse:  (!) 115  (!) 120  Resp:      Temp:      TempSrc:      SpO2: 94%  95%   Weight:      Height:        Intake/Output Summary (Last 24 hours) at 03/23/2018 1135 Last data filed at 03/23/2018 0130 Gross per 24 hour  Intake 420 ml  Output 1700 ml  Net -1280 ml   Filed Weights   03/20/18 0456 03/21/18 0510 03/22/18 0636  Weight: 75.7 kg (166 lb 14.2 oz) 72 kg (158 lb 11.7 oz) 75.9 kg (167 lb 5.3 oz)    Examination: no changes in exam from 6/15  General: Ill-looking, cachectic Cardiovascular: RRR, S1/S2 +, no rubs, no gallops Respiratory: Breath sounds diminished on the left, no wheezing Abdominal: Soft, NT, ND, bowel sounds + Extremities: no edema, no cyanosis   Data Reviewed: I have personally reviewed following labs and imaging studies  CBC: Recent Labs  Lab 03/19/18 0502 03/20/18 0444 03/21/18 0508  WBC 12.8* 11.7* 10.6*  HGB 11.1* 11.1* 10.8*  HCT 34.4* 34.2* 33.1*    MCV 89.4 87.9 89.9  PLT 183 197 782   Basic Metabolic Panel: Recent Labs  Lab 03/18/18 0915 03/18/18 1545 03/19/18 0502 03/20/18 0444 03/21/18 0508 03/22/18 0327 03/23/18 0323  NA  --  133*  --  134* 132* 133* 134*  K  --  4.6  --  4.8 5.2* 5.0 5.0  CL  --  101  --  103 103 103 104  CO2  --  21*  --  20* 17* 19* 20*  GLUCOSE  --  161*  --  141* 205* 177* 182*  BUN  --  21*  --  22* 26* 29* 28*  CREATININE  --  0.92 0.95 0.91 0.96 0.87 0.67  CALCIUM  --  8.1*  --  8.5* 8.8* 9.3 9.3  MG  --   --   --  2.0  --  2.1 2.2  PHOS 3.7  --   --   --   --   --   --    GFR: Estimated Creatinine Clearance: 93.3  mL/min (by C-G formula based on SCr of 0.67 mg/dL). Liver Function Tests: No results for input(s): AST, ALT, ALKPHOS, BILITOT, PROT, ALBUMIN in the last 168 hours. No results for input(s): LIPASE, AMYLASE in the last 168 hours. No results for input(s): AMMONIA in the last 168 hours. Coagulation Profile: No results for input(s): INR, PROTIME in the last 168 hours. Cardiac Enzymes: No results for input(s): CKTOTAL, CKMB, CKMBINDEX, TROPONINI in the last 168 hours. BNP (last 3 results) No results for input(s): PROBNP in the last 8760 hours. HbA1C: No results for input(s): HGBA1C in the last 72 hours. CBG: No results for input(s): GLUCAP in the last 168 hours. Lipid Profile: No results for input(s): CHOL, HDL, LDLCALC, TRIG, CHOLHDL, LDLDIRECT in the last 72 hours. Thyroid Function Tests: No results for input(s): TSH, T4TOTAL, FREET4, T3FREE, THYROIDAB in the last 72 hours. Anemia Panel: No results for input(s): VITAMINB12, FOLATE, FERRITIN, TIBC, IRON, RETICCTPCT in the last 72 hours. Sepsis Labs: No results for input(s): PROCALCITON, LATICACIDVEN in the last 168 hours.  Recent Results (from the past 240 hour(s))  Culture, blood (Routine X 2) w Reflex to ID Panel     Status: None   Collection Time: 03/14/18  3:12 PM  Result Value Ref Range Status   Specimen Description    Final    BLOOD LEFT ANTECUBITAL Performed at [facility] [address] [place], [place] [address]    Special Requests   Final    BOTTLES DRAWN AEROBIC ONLY Blood Culture adequate volume Performed at [facility] [address], [place], [place] [address]    Culture   Final    NO GROWTH 5 DAYS Performed at [facility], [place] [address], [place], [place] [address]    Report Status 03/19/2018 FINAL  Final  Culture, blood (Routine X 2) w Reflex to ID Panel     Status: None   Collection Time: 03/14/18  3:13 PM  Result Value Ref Range Status   Specimen Description   Final    BLOOD RIGHT ANTECUBITAL Performed at [facility] [address], [place], [place] [address]    Special Requests   Final    BOTTLES DRAWN AEROBIC ONLY Blood Culture adequate volume Performed at [facility] [address], [place], [place] [address]    Culture   Final    NO GROWTH 5 DAYS Performed at [facility], [place] [address], [place], [place] [address]    Report Status 03/19/2018 FINAL  Final  Culture, body fluid-bottle     Status: None   Collection Time: 03/15/18 10:52 AM  Result Value Ref Range Status   Specimen Description FLUID LEFT PLEURAL  Final   Special Requests BOTTLES DRAWN AEROBIC AND ANAEROBIC  Final   Culture   Final    NO GROWTH 5 DAYS Performed at [facility], [place] [address], [place], [place] [address]    Report Status 03/21/2018 FINAL  Final  Gram stain     Status: None   Collection Time: 03/15/18 10:52 AM  Result Value Ref Range Status   Specimen Description FLUID LEFT PLEURAL  Final   Special Requests NONE  Final   Gram Stain   Final    MODERATE WBC PRESENT, PREDOMINANTLY MONONUCLEAR NO ORGANISMS SEEN Performed at [facility], [place] [address], [place], [place] [address]    Report Status 03/16/2018 FINAL  Final  MRSA PCR Screening  Status: None   Collection Time: 03/17/18   9:23 AM  Result Value Ref Range Status   MRSA by PCR NEGATIVE NEGATIVE Final    Comment:        The GeneXpert MRSA Assay (FDA approved for NASAL specimens only), is one component of a comprehensive MRSA colonization surveillance program. It is not intended to diagnose MRSA infection nor to guide or monitor treatment for MRSA infections. Performed at [facility], [place] [address], [place], [place] [address]       Radiology Studies: No results found.    Scheduled Meds: . chlorhexidine  15 mL Mouth Rinse BID  . enoxaparin (LOVENOX) injection  40 mg Subcutaneous Q24H  . feeding supplement  1 Container Oral BID BM  . fluconazole  100 mg Oral Daily  . ipratropium-albuterol  3 mL Nebulization TID  . magic mouthwash w/lidocaine  5 mL Oral TID  . mouth rinse  15 mL Mouth Rinse q12n4p  . mometasone   Topical Daily  . morphine  30 mg Oral Q12H  . multivitamin with minerals  1 tablet Oral Daily  . polyethylene glycol  17 g Oral Daily  . protein supplement shake  11 oz Oral BID BM  . senna-docusate  1 tablet Oral BID   Continuous Infusions:    LOS: 16 days    Time spent: Total of 25 minutes spent with pt, greater than 50% of which was spent in discussion of  treatment, counseling and coordination of care   Chipper Oman, MD Pager: Text Page via [email or web address]   If 7PM-7AM, please contact night-coverage [email or web address] 03/23/2018, 11:35 AM   Note - This record has been created using [employer]. Chart creation errors have been sought, but may not always have been located. Such creation errors do not reflect on the standard of medical care.

## 2018-03-23 NOTE — Progress Notes (Signed)
Pt due to have chest drained via pleurex. At 0545 pt refused. Reassessed VS currently and pt agreed to drain but BP 89/54. Paged [place] with [facility]. Awaiting orders.

## 2018-03-24 ENCOUNTER — Ambulatory Visit
Admit: 2018-03-24 | Discharge: 2018-03-24 | Disposition: A | Discharge status: Home / Self Care | Payer: Medicare Other | Attending: Radiation Oncology | Admitting: Radiation Oncology

## 2018-03-24 NOTE — Progress Notes (Signed)
PROGRESS NOTE [facility]   Kristopher Johnson   OJJ:[id number] DOB: 01/28/1956  DOA: 02/11/2018 PCP: Patient, No Pcp Per   Brief Narrative:  Kristopher Johnson is an 62 y.o. 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  Radiation oncology was consulted and recommended 2 weeks of elective radiation.  We consulted hospice and palliative care and patient and would like to continue aggressive treatment.  The patient subsequently developed a sepsis-like picture, he was started empirically on IV vancomycin and cefepime and his picture seems to be improving, his remained afebrile, his leukocytosis is improving. He is also having his Pleurx catheter drain TID. He was tried on oral hydration and he was not able to keep up with the losses, therefore he was started on IV fluids, now his acute renal failure has improved his hypotension and tachycardia have also improved.  Subjective: Patient seen and examined, today doing slight better he received radiation therapy, breathing is okay.  Participate with PT today.  Drain about 850 from Pleurx. Pain has improved.  Assessment & Plan: Mantle cell lymphoma of lymph nodes of multiple regions Oncology recommendations appreciated, patient on ibrutinib, rituxan q 3 weeks. Holding Venetoclax  Palliative care consulted patient is DNR, recommended hospice care, however patient wishes to continue aggressive treatment.  Attempting palliative radiation for 2 weeks in hopes of reducing effusion and improve respiratory status. Oncology recommended optimization of pleural fluid,  along with nutritional status in order to reconsider palliative systemic therapy in the future, SIRS picture has resolved.    I have discussed hospice care with patient and he deferred to his family for further decisions, I have called his daughter x2 but no answer, left a VM.    PT is recommending SNF, if family is not interested on hospice care at this time SNF with palliative care will be a good option.  Will continue supportive treatment for now, out of bed as tolerated.  I have discussed with POA, she will prefer for him to go SNF with palliative care,   Cancer related pain - better controlled  Adjusting pain regimen,  MS Contin 30 mg twice daily, OxyContin IR 10 mg every 3 hours for breakthrough pain and Tylenol thousand milligrams every 6 hours as needed.    Recurrent malignant pleural effusion on the left Continue to drain 3 times daily Continue palliative radiotherapy  Acute kidney injury - resolved  Prerenal related to pleural fluid losses and decreased oral intake Function remains stable, patient tolerating oral diet well.  Leukocytosis - improved   No signs of sepsis, no source of infection.  No fever Patient was started empirically on IV vancomycin and Zosyn, blood cultures negative so far. MRSA PCR negative.  Continue to monitor off antibiotics  Orthostatic hypotension Improved   Dysphagia Continue Diflucan x 7 days, end date 6/18 Continue Magic mouth wash  DVT prophylaxis: Lovenox Code Status: DNR Family Communication: None at bedside Disposition Plan: SNF in 2-3 days   Consultants:   Oncology  Radiation oncology  Palliative care  Procedures:   Radiation therapy  Antimicrobials: Anti-infectives (From admission, onward)   Start     Dose/Rate Route  Frequency Ordered Stop   03/18/18 1000  fluconazole (DIFLUCAN) tablet 100 mg     100 mg Oral Daily 03/18/18 0743     03/16/18 1600  fluconazole (DIFLUCAN) IVPB 100 mg  Status:  Discontinued     100 mg 50  mL/hr over 60 Minutes Intravenous Every 24 hours 03/16/18 1431 03/18/18 0743   03/15/18 2200  vancomycin (VANCOCIN) 1,250 mg in sodium chloride 0.9 % 250 mL IVPB  Status:  Discontinued     1,250 mg 166.7 mL/hr over 90 Minutes Intravenous Every 24 hours 03/14/18 1614 03/18/18 1310   03/14/18 2200  vancomycin (VANCOCIN) IVPB 750 mg/150 ml premix     750 mg 150 mL/hr over 60 Minutes Intravenous  Once 03/14/18 1614 03/14/18 2300   03/14/18 2200  piperacillin-tazobactam (ZOSYN) IVPB 3.375 g  Status:  Discontinued     3.375 g 12.5 mL/hr over 240 Minutes Intravenous Every 8 hours 03/14/18 1614 03/20/18 0750   03/14/18 1515  vancomycin (VANCOCIN) IVPB 1000 mg/200 mL premix     1,000 mg 200 mL/hr over 60 Minutes Intravenous  Once 03/14/18 1506 03/14/18 1644   03/14/18 1515  piperacillin-tazobactam (ZOSYN) IVPB 3.375 g     3.375 g 100 mL/hr over 30 Minutes Intravenous  Once 03/14/18 1506 03/14/18 1614        Objective: Vitals:   03/23/18 2127 03/24/18 0500 03/24/18 0835 03/24/18 1342  BP: (!) 106/55 93/67  (!) 88/70  Pulse:  (!) 118  (!) 113  Resp:  15  17  Temp:  98 F (36.7 C)    TempSrc:      SpO2:  94% 92% 100%  Weight:  73.6 kg (162 lb 4.8 oz)    Height:        Intake/Output Summary (Last 24 hours) at 03/24/2018 1522 Last data filed at 03/24/2018 1244 Gross per 24 hour  Intake 480 ml  Output 1850 ml  Net -1370 ml   Filed Weights   03/21/18 0510 03/22/18 0636 03/24/18 0500  Weight: 72 kg (158 lb 11.7 oz) 75.9 kg (167 lb 5.3 oz) 73.6 kg (162 lb 4.8 oz)    Examination:   General: ill looking, cachectic  Cardiovascular: Tachycardia, S1S2 no murmurs Respiratory: Decrease BS b/l no wheezing  Abdominal: Soft, NT, ND, bowel sounds + Extremities: trace LE edema, no cyanosis  Data Reviewed: I have personally reviewed following labs and imaging studies  CBC: Recent Labs  Lab 03/19/18 0502 03/20/18 0444 03/21/18 0508  WBC 12.8* 11.7* 10.6*  HGB 11.1* 11.1* 10.8*  HCT  34.4* 34.2* 33.1*  MCV 89.4 87.9 89.9  PLT 183 197 814   Basic Metabolic Panel: Recent Labs  Lab 03/18/18 0915 03/18/18 1545 03/19/18 0502 03/20/18 0444 03/21/18 0508 03/22/18 0327 03/23/18 0323  NA  --  133*  --  134* 132* 133* 134*  K  --  4.6  --  4.8 5.2* 5.0 5.0  CL  --  101  --  103 103 103 104  CO2  --  21*  --  20* 17* 19* 20*  GLUCOSE  --  161*  --  141* 205* 177* 182*  BUN  --  21*  --  22* 26* 29* 28*  CREATININE  --  0.92 0.95 0.91 0.96 0.87 0.67  CALCIUM  --  8.1*  --  8.5* 8.8* 9.3 9.3  MG  --   --   --  2.0  --  2.1 2.2  PHOS 3.7  --   --   --   --   --   --  GFR: Estimated Creatinine Clearance: 93.3 mL/min (by C-G formula based on SCr of 0.67 mg/dL). Liver Function Tests: No results for input(s): AST, ALT, ALKPHOS, BILITOT, PROT, ALBUMIN in the last 168 hours. No results for input(s): LIPASE, AMYLASE in the last 168 hours. No results for input(s): AMMONIA in the last 168 hours. Coagulation Profile: No results for input(s): INR, PROTIME in the last 168 hours. Cardiac Enzymes: No results for input(s): CKTOTAL, CKMB, CKMBINDEX, TROPONINI in the last 168 hours. BNP (last 3 results) No results for input(s): PROBNP in the last 8760 hours. HbA1C: No results for input(s): HGBA1C in the last 72 hours. CBG: No results for input(s): GLUCAP in the last 168 hours. Lipid Profile: No results for input(s): CHOL, HDL, LDLCALC, TRIG, CHOLHDL, LDLDIRECT in the last 72 hours. Thyroid Function Tests: No results for input(s): TSH, T4TOTAL, FREET4, T3FREE, THYROIDAB in the last 72 hours. Anemia Panel: No results for input(s): VITAMINB12, FOLATE, FERRITIN, TIBC, IRON, RETICCTPCT in the last 72 hours. Sepsis Labs: No results for input(s): PROCALCITON, LATICACIDVEN in the last 168 hours.  Recent Results (from the past 240 hour(s))  Culture, body fluid-bottle     Status: None   Collection Time: 03/15/18 10:52 AM  Result Value Ref Range Status   Specimen Description FLUID  LEFT PLEURAL  Final   Special Requests BOTTLES DRAWN AEROBIC AND ANAEROBIC  Final   Culture   Final    NO GROWTH 5 DAYS Performed at [facility], [place] [address], [place], [place] [address]    Report Status 03/21/2018 FINAL  Final  Gram stain     Status: None   Collection Time: 03/15/18 10:52 AM  Result Value Ref Range Status   Specimen Description FLUID LEFT PLEURAL  Final   Special Requests NONE  Final   Gram Stain   Final    MODERATE WBC PRESENT, PREDOMINANTLY MONONUCLEAR NO ORGANISMS SEEN Performed at [facility], [place] [address], [place], [place] [address]    Report Status 03/16/2018 FINAL  Final  MRSA PCR Screening     Status: None   Collection Time: 03/17/18  9:23 AM  Result Value Ref Range Status   MRSA by PCR NEGATIVE NEGATIVE Final    Comment:        The GeneXpert MRSA Assay (FDA approved for NASAL specimens only), is one component of a comprehensive MRSA colonization surveillance program. It is not intended to diagnose MRSA infection nor to guide or monitor treatment for MRSA infections. Performed at [facility], [place] [address] [place], [place] [address]       Radiology Studies: No results found.    Scheduled Meds: . chlorhexidine  15 mL Mouth Rinse BID  . enoxaparin (LOVENOX) injection  40 mg Subcutaneous Q24H  . feeding supplement  1 Container Oral BID BM  . fluconazole  100 mg Oral Daily  . ipratropium-albuterol  3 mL Nebulization TID  . magic mouthwash w/lidocaine  5 mL Oral TID  . mouth rinse  15 mL Mouth Rinse q12n4p  . mometasone   Topical Daily  . morphine  30 mg Oral Q12H  . multivitamin with minerals  1 tablet Oral Daily  . polyethylene glycol  17 g Oral Daily  . protein supplement shake  11 oz Oral BID BM  . senna-docusate  1 tablet Oral BID   Continuous Infusions:    LOS: 17 days    Time spent: Total of 25 minutes spent with pt, greater than 50% of which  was spent in discussion of  treatment,  counseling and coordination of care   Chipper Oman, MD Pager: Text Page via [email or web address]   If 7PM-7AM, please contact night-coverage [email or web address] 03/24/2018, 3:22 PM   Note - This record has been created using [employer]. Chart creation errors have been sought, but may not always have been located. Such creation errors do not reflect on the standard of medical care.

## 2018-03-24 NOTE — Progress Notes (Signed)
Pleurx catheter drained and yielded 800cc of amber fluid. Patient tolerated procedure well. Will continue to monitor patient.

## 2018-03-24 NOTE — Evaluation (Deleted)
Physical Therapy Evaluation Patient Details Name: Kristopher Johnson MRN: [id number] DOB: Sep 21, 1956 Today's Date: 03/24/2018   History of Present Illness  62 year old man was admitted with dizziness; SOB with exertion.  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    Clinical Impression  Pt admitted with above diagnosis. Pt currently with functional limitations due to the deficits listed below (see PT Problem List). Patient requires min assist for tactile cues for bed mobility; min assist for safe hand placement and balance with transfers and ambulation. Did not attempt AD today. Pt incontinent of bladder prior to short distance ambulation. Pt will benefit from skilled PT to increase their independence and safety with mobility to allow discharge to the venue listed below.       Follow Up Recommendations SNF;Supervision/Assistance - 24 hour    Equipment Recommendations  (to be determined at next venue of care; possibly RW and w/c)    Recommendations for Other Services       Precautions / Restrictions Precautions Precautions: Fall Precaution Comments: has orthostatic, watch HR Restrictions Weight Bearing Restrictions: No      Mobility  Bed Mobility Overal bed mobility: Needs Assistance Bed Mobility: Supine to Sit     Supine to sit: Min assist     General bed mobility comments: tactile cues for LEs OOB; verbal cues not as successful  Transfers Overall transfer level: Needs assistance Equipment used: 2 person hand held assist Transfers: Sit to/from [employer] Transfers Sit to Stand: Min assist Stand pivot transfers: Min assist       General transfer comment: verbal and tactile cues for hand placement and directionality  Ambulation/Gait Ambulation/Gait assistance: Min assist Gait Distance (Feet): 6 Feet Assistive device: 2 person hand held assist Gait Pattern/deviations: Shuffle Gait  velocity: decreased   General Gait Details: unfamiliar environment couple with legally blind, shuffling, apprehensive  Stairs            Wheelchair Mobility    Modified Rankin (Stroke Patients Only)       Balance Overall balance assessment: Needs assistance Sitting-balance support: No upper extremity supported;Feet supported Sitting balance-Leahy Scale: Fair     Standing balance support: Bilateral upper extremity supported;During functional activity Standing balance-Leahy Scale: Fair Standing balance comment: reliant on 2 hands held for safety                             Pertinent Vitals/Pain Pain Assessment: No/denies pain    Home Living Family/patient expects to be discharged to:: Private residence Living Arrangements: Children(daughter, granddaughter and grandson) Available Help at Discharge: Family Type of Home: House Home Access: Stairs to enter Entrance Stairs-Rails: Right Entrance Stairs-Number of Steps: 5 Home Layout: One level Home Equipment: None Additional Comments: lives with daughter/granddaughter/grandson: someone is always home with him    Prior Function Level of Independence: Needs assistance   Gait / Transfers Assistance Needed: was walking on his own in his familiar environment PTA.  ADL's / Homemaking Assistance Needed: does not do homemaking   Comments: has not been able to go out of house since April due to SOB/dizzy, unable to eat much     Hand Dominance        Extremity/Trunk Assessment   Upper Extremity Assessment Upper Extremity Assessment: Generalized weakness    Lower Extremity Assessment Lower Extremity Assessment: Generalized weakness    Cervical / Trunk Assessment Cervical / Trunk Assessment: Kyphotic  Communication   Communication: No difficulties  Cognition Arousal/Alertness: Awake/alert Behavior During Therapy: Flat affect Overall Cognitive Status: Within Functional Limits for tasks assessed                                         General Comments      Exercises     Assessment/Plan    PT Assessment Patient needs continued PT services  PT Problem List Decreased strength;Decreased mobility;Decreased safety awareness;Decreased balance;Decreased knowledge of use of DME;Decreased activity tolerance;Cardiopulmonary status limiting activity       PT Treatment Interventions DME instruction;Functional mobility training;Balance training;Patient/family education;Gait training;Therapeutic activities;Stair training;Therapeutic exercise    PT Goals (Current goals can be found in the Care Plan section)  Acute Rehab PT Goals Patient Stated Goal: Pt - none stated; daughter - for dad to have support he needs, especially equipment at home.  PT Goal Formulation: With patient/family Time For Goal Achievement: 04/07/18 Potential to Achieve Goals: Fair    Frequency Min 2X/week   Barriers to discharge        Co-evaluation               AM-PAC PT "6 Clicks" Daily Activity  Outcome Measure Difficulty turning over in bed (including adjusting bedclothes, sheets and blankets)?: A Little Difficulty moving from lying on back to sitting on the side of the bed? : A Little Difficulty sitting down on and standing up from a chair with arms (e.g., wheelchair, bedside commode, etc,.)?: A Little Help needed moving to and from a bed to chair (including a wheelchair)?: A Little Help needed walking in hospital room?: A Little Help needed climbing 3-5 steps with a railing? : A Lot 6 Click Score: 17    End of Session   Activity Tolerance: Patient limited by fatigue Patient left: in chair;with family/visitor present;with call bell/phone within reach(nursing to return to attend to hygiene) Nurse Communication: Mobility status PT Visit Diagnosis: Other abnormalities of gait and mobility (R26.89);Muscle weakness (generalized) (M62.81)    Time: 0623-7628 PT Time Calculation (min) (ACUTE  ONLY): 30 min   Charges:   PT Evaluation $PT Eval Moderate Complexity: 1 Mod PT Treatments $Gait Training: 8-22 mins   PT G Codes:        Kristopher Johnson D. Hartnett-Rands, MS, PT Per [place] [phone] 03/24/2018, 1:40 PM

## 2018-03-24 NOTE — Progress Notes (Signed)
Physical Therapy Treatment Patient Details Name: Kristopher Johnson MRN: [id number] DOB: [birth year] Today's Date: 03/24/2018    History of Present Illness 62 year old man was admitted with dizziness; SOB with exertion.  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    PT Comments    Patient agreeable to transfer from bed to recliner. Patient incontinent of bladder prior to transfer. Sit-to-stand transfers for hygiene and clean clothing and linens. Ambulated 6 feet with 2 hands held and tactile cues for directionality and safe hand placement.    Follow Up Recommendations  SNF;Supervision/Assistance - 24 hour vs HHPT     Equipment Recommendations  (to be determined at next venue of care; possibly RW and w/c)    Recommendations for Other Services       Precautions / Restrictions Precautions Precautions: Fall Precaution Comments: has orthostatic, watch HR Restrictions Weight Bearing Restrictions: No    Mobility  Bed Mobility Overal bed mobility: Needs Assistance Bed Mobility: Supine to Sit     Supine to sit: Min assist     General bed mobility comments: tactile cues for LEs OOB; verbal cues not as successful  Transfers Overall transfer level: Needs assistance Equipment used: 2 person hand held assist Transfers: Sit to/from [employer] Transfers Sit to Stand: Min assist Stand pivot transfers: Min assist       General transfer comment: verbal and tactile cues for hand placement and directionality  Ambulation/Gait Ambulation/Gait assistance: Min assist Gait Distance (Feet): 6 Feet Assistive device: 2 person hand held assist Gait Pattern/deviations: Shuffle Gait velocity: decreased   General Gait Details: unfamiliar environment couple with legally blind, shuffling, apprehensive   Stairs             Wheelchair Mobility    Modified Rankin (Stroke Patients Only)       Balance  Overall balance assessment: Needs assistance Sitting-balance support: No upper extremity supported;Feet supported Sitting balance-Leahy Scale: Fair     Standing balance support: Bilateral upper extremity supported;During functional activity Standing balance-Leahy Scale: Fair Standing balance comment: reliant on 2 hands held for safety                            Cognition Arousal/Alertness: Awake/alert Behavior During Therapy: Flat affect Overall Cognitive Status: Within Functional Limits for tasks assessed                                        Exercises      General Comments        Pertinent Vitals/Pain Pain Assessment: No/denies pain    Home Living Family/patient expects to be discharged to:: Private residence Living Arrangements: Children(daughter, granddaughter and grandson) Available Help at Discharge: Family Type of Home: House Home Access: Stairs to enter Entrance Stairs-Rails: Right Home Layout: One level Home Equipment: None Additional Comments: lives with daughter/granddaughter/grandson: someone is always home with him    Prior Function Level of Independence: Needs assistance  Gait / Transfers Assistance Needed: was walking on his own in his familiar environment PTA. ADL's / Homemaking Assistance Needed: does not do homemaking  Comments: has not been able to go out of house since April due to SOB/dizzy, unable to eat much   PT Goals (current goals can now be found in the care plan section) Acute Rehab PT Goals Patient Stated  Goal: Pt - none stated; daughter - for dad to have support he needs, especially equipment at home.  PT Goal Formulation: With patient/family Time For Goal Achievement: 04/07/18 Potential to Achieve Goals: Fair    Frequency    Min 2X/week      PT Plan      Co-evaluation              AM-PAC PT "6 Clicks" Daily Activity  Outcome Measure  Difficulty turning over in bed (including adjusting  bedclothes, sheets and blankets)?: A Little Difficulty moving from lying on back to sitting on the side of the bed? : A Little Difficulty sitting down on and standing up from a chair with arms (e.g., wheelchair, bedside commode, etc,.)?: A Little Help needed moving to and from a bed to chair (including a wheelchair)?: A Little Help needed walking in hospital room?: A Little Help needed climbing 3-5 steps with a railing? : A Lot 6 Click Score: 17    End of Session   Activity Tolerance: Patient limited by fatigue Patient left: in chair;with family/visitor present;with call bell/phone within reach(nursing to return to attend to hygiene) Nurse Communication: Mobility status PT Visit Diagnosis: Other abnormalities of gait and mobility (R26.89);Muscle weakness (generalized) (M62.81)     Time: 3474-2595 PT Time Calculation (min) (ACUTE ONLY): 30 min  Charges:  $Gait Training: 8-22 mins                    G Codes:       Aubriegh Minch D. Hartnett-Rands, MS, PT Per [place] #[id number] 03/24/2018, 1:43 PM

## 2018-03-25 ENCOUNTER — Inpatient Hospital Stay (HOSPITAL_COMMUNITY): Payer: Medicare Other

## 2018-03-25 ENCOUNTER — Other Ambulatory Visit: Payer: Self-pay | Admitting: Pharmacist

## 2018-03-25 ENCOUNTER — Ambulatory Visit
Admit: 2018-03-25 | Discharge: 2018-03-25 | Disposition: A | Discharge status: Home / Self Care | Payer: Medicare Other | Attending: Radiation Oncology | Admitting: Radiation Oncology

## 2018-03-25 LAB — BASIC METABOLIC PANEL
Anion gap: 11 (ref 5–15)
BUN: 42 mg/dL — ABNORMAL HIGH (ref 6–20)
CALCIUM: 9.1 mg/dL (ref 8.9–10.3)
CO2: 18 mmol/L — AB (ref 22–32)
CREATININE: 1.32 mg/dL — AB (ref 0.61–1.24)
Chloride: 105 mmol/L (ref 101–111)
GFR calc Af Amer: >60 mL/min (ref >60)
GFR calc non Af Amer: 55 mL/min — ABNORMAL LOW (ref >60)
Glucose, Bld: 213 mg/dL — ABNORMAL HIGH (ref 65–99)
Potassium: 6 mmol/L — ABNORMAL HIGH (ref 3.5–5.1)
Sodium: 134 mmol/L — ABNORMAL LOW (ref 135–145)

## 2018-03-25 LAB — MAGNESIUM: Magnesium: 2.3 mg/dL (ref 1.7–2.4)

## 2018-03-25 MED ORDER — BOOST / RESOURCE BREEZE PO LIQD CUSTOM
1.0000 | ORAL | Status: DC
  Administered 2018-03-28: 1 via ORAL

## 2018-03-25 MED ORDER — MORPHINE SULFATE ER 15 MG PO TBCR
15.0000 mg | EXTENDED_RELEASE_TABLET | Freq: Two times a day (BID) | ORAL | Status: DC
  Administered 2018-03-25 – 2018-03-29 (×8): 15 mg via ORAL
  Filled 2018-03-25 (×8): qty 1

## 2018-03-25 MED ORDER — OXYCODONE HCL 5 MG PO TABS: 5.0000 mg | ORAL_TABLET | ORAL | Status: DC | PRN

## 2018-03-25 MED ORDER — SODIUM CHLORIDE 0.9 % IV BOLUS
1000.0000 mL | Freq: Once | INTRAVENOUS | Status: AC
  Administered 2018-03-25: 1000 mL via INTRAVENOUS

## 2018-03-25 MED ORDER — SODIUM CHLORIDE 0.9 % IV SOLN
INTRAVENOUS | Status: DC
  Administered 2018-03-25 – 2018-03-26 (×3): via INTRAVENOUS

## 2018-03-25 MED ORDER — PREMIER PROTEIN SHAKE
11.0000 [oz_av] | ORAL | Status: DC
  Filled 2018-03-25 (×6): qty 325.31

## 2018-03-25 NOTE — Progress Notes (Addendum)
Palliative Medicine RN Note: Rec'd a call from [place], [facility]. She reports that patient is declining, and the nurses are requesting PMT see Kristopher Johnson again. However, the family is still requesting treatment and is, per Juliann Pulse, getting input from their family doctor. Juliann Pulse spoke with Dr Quincy Simmonds, who does not request we re-engage at this time.   PMT will continue to chart check, but we WILL NOT re-engage unless called by the attending physician.  Marjie Skiff Madelena Maturin, RN, BSN, [facility] Palliative Medicine Team 03/25/2018 1:26 PM Office [phone]

## 2018-03-25 NOTE — Progress Notes (Signed)
Pt very has a very flat affect today and not communicating much to staff. He will answer sometimes with yes or no after asking the same question multiple times. Patient states that he is very weak and doesn't want to move much. He is refusing the majority of his medications and seems to have a hard time swallowing any pills. Also patient has no appetite. RN tried to get him to eat some breakfast or at least one of the protein shakes ordered for him but patient still refused. Will continue to monitor patient.  Carmela Hurt, RN

## 2018-03-25 NOTE — Progress Notes (Signed)
Offered pt a fruit plate this morning. Tried to feed pt as well, pt is not interested in eating but will request water.

## 2018-03-25 NOTE — Progress Notes (Signed)
Noted ordered for CT head, ivf, per attending-family wants to continue treatment. If palliative team is needed-attending must call to arrange.

## 2018-03-25 NOTE — Progress Notes (Signed)
OT Cancellation Note  Patient Details Name: Kristopher Johnson MRN: [id number] DOB: 1956/09/16   Cancelled Treatment:    Reason Eval/Treat Not Completed: Patient declined, no reason specified.   Communicated with RN regarding discussion with pt.  03/25/2018  [name], [place]   Payton Mccallum D 03/25/2018, 10:12 AM

## 2018-03-25 NOTE — Progress Notes (Signed)
Nutrition Follow-up  DOCUMENTATION CODES:   Non-severe (moderate) malnutrition in context of chronic illness  INTERVENTION:  - Will decrease Boost Breeze and Premier Protein each to once/day. - Will trial Magic Cup with lunch meals, this supplement provides 290 kcal and 9 grams of protein. - Continue to encourage PO intakes.   NUTRITION DIAGNOSIS:   Moderate Malnutrition related to chronic illness, catabolic illness, cancer and cancer related treatments as evidenced by moderate fat depletion, moderate muscle depletion. -ongoing  GOAL:   Patient will meet greater than or equal to 90% of their needs -unmet  MONITOR:   PO intake, Supplement acceptance, Weight trends, Labs  ASSESSMENT:   62 y.o. 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  Brought to the ED via EMS with complaints of dizziness on standing of ~ 4 days duration, such that patient was not able to get out of bed on the day of admission. SOB with exertion--more chronic, over the past month, with associated orthopnea. Since Pleurex placement, 1L pleural fluid has been drained every 48 hours by home health nurse who comes to patient's house. Patient is on venetoclax for his lymphoma. Initial dose was 20 mg, with gradual increase in dose every week. The day before admission, patient took 100 mg.  Patient states since he started taking the medication he has gotten progressively worse, and he feels his current symptoms especially dizziness is related to increased dose of venetoclax. Patient reports nausea and poor p.o. intake since starting venetoclax.  Weight trending down with ongoing Pleur-X drainage and possibly also from prolonged (~3 weeks) very poor PO intakes. Patient refused breakfast this AM, even with feeding assistance provided. He ate 10% of lunch yesterday but refused both  breakfast and dinner. Patient has been refusing oral nutrition supplements nearly every time that they are offered. Continue to encourage intakes.   Patient is DNR. Will monitor for POC and overall GOC. If patient continues with very poor intakes and if within [place], recommend discussion with pt about PEG. Noted that last BM was 1 week ago (6/11) and prior to that, pt had not had a BM for a week (6/4). Constipation may also be contributing to very poor PO intakes.  Per Dr. Elza Rafter note yesterday afternoon: "Oncology recommended optimization of pleural fluid, along with nutritional status in order to reconsider palliative systemic therapy in the future." Hospice care has been discussed with patient but he deferred to his family and there has been an inability to reach his daughter. Drainage from Pleur-X TID. AKI has resolved and leukocytosis has improved.     Medications reviewed; daily multivitamin with minerals, 1 packet Miralax/day, 1 tablet Senokot BID. Labs reviewed; Na: 134 mmol/L, K: 6 mmol/L, BUN: 42 mg/dL, creatinine: 1.32 mg/dL.     Diet Order:   Diet Order           Diet general        Diet regular Room service appropriate? Yes; Fluid consistency: Thin  Diet effective now          EDUCATION NEEDS:   No education needs have been identified at this time  Skin:  Skin Assessment: Reviewed RN Assessment  Last BM:  6/11  Height:   Ht Readings from Last 1 Encounters:  02/14/2018 5\' 9"  (1.753 m)    Weight:   Wt Readings from Last 1 Encounters:  03/25/18 157 lb 10.1 oz (  71.5 kg)    Ideal Body Weight:  72.73 kg  BMI:  Body mass index is 23.28 kg/m.  Estimated Nutritional Needs:   Kcal:  8099-8338 (26-28 kcal/kg)  Protein:  116-132 grams (1.4-1.6 grams/kg)  Fluid:  >/= 2.1 L/day     Jarome Matin, MS, RD, LDN, [facility] Inpatient Clinical Dietitian Pager # [phone] After hours/weekend pager # [phone]

## 2018-03-25 NOTE — Progress Notes (Addendum)
PROGRESS NOTE [facility]   Kristopher Johnson   TGG:[id number] DOB: [birth year]  DOA: 02/20/2018 PCP: Patient, No Pcp Per   Brief Narrative:  Kristopher Johnson is an 62 y.o. 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  Radiation oncology was consulted and recommended 2 weeks of elective radiation.  We consulted hospice and palliative care and patient and would like to continue aggressive treatment.  The patient subsequently developed a sepsis-like picture, he was started empirically on IV vancomycin and cefepime and his picture seems to be improving, his remained afebrile, his leukocytosis is improving. He is also having his Pleurx catheter drain TID. He was tried on oral hydration and he was not able to keep up with the losses, therefore he was started on IV fluids, now his acute renal failure has improved his hypotension and tachycardia have also improved.  Subjective: Patient seen and examined, he continues to decline. Very weak today, minimally responsive. No eating much, refusing majority of his medication.    Assessment & Plan: Mantle cell lymphoma of lymph nodes of multiple regions Patient on ibrutinib, rituxan q 3 weeks. Holding Venetoclax.  Oncology not recommending any systemic chemotherapy as patient is too frail for treatment.  Offered palliative radiation for 2 weeks in hopes of reducing pleural effusion and improve respiratory status, however no much improvement has been noted.  Patient is continued to decline.   I have discussed with Kristopher Johnson,  who called me on behalf of Kristopher Johnson.   I explained prognosis, condition and that this is not a curable disease, which seems to be getting worse day by day.  family members believe that he has improved from initial hospitalization and would like to continue aggressive treatment for now.  They are not interested on hospice care at this time.  Plan will be skilled nursing facility with palliative care consult.  Social worker has been consulted to start skilled nursing facility placement.  Cancer related pain  Pain is better controlled now however patient more somnolent and less responsive Will decrease pain medication doses to  MS Contin 15 mg twice daily, OxyContin IR5 mg every 4 hours for breakthrough pain and Tylenol thousand milligrams every 6 hours as needed.   Will obtain CT head to rule out metastasis given increasing lethargy.   Recurrent malignant pleural effusion on the left Continue to drain 3 times daily - patient refusing drainage at times Continue palliative radiotherapy total of 2 weeks  Acute kidney injury - worsen today Patient with decreasing fluid intake since yesterday , creatinine up to 1.3 Slight elevated potassium, will give IV fluids and keep on normal saline at 50 cc an hour Continue to encourage oral diet.  Leukocytosis - improved   No signs of sepsis, no source of infection.  No fever Patient was started empirically on IV vancomycin and Zosyn, blood cultures negative so far. MRSA PCR negative.  Continue to monitor off antibiotics. Check CBC in AM   Orthostatic hypotension Improved   Dysphagia Continue Diflucan x 7 days, 2 more doses  Continue Magic mouth wash - refusing at times   DVT prophylaxis: Lovenox Code Status: DNR Family Communication:  None at bedside Disposition Plan: SNF in 2-3 days   Consultants:   Oncology  Radiation oncology  Palliative care  Procedures:   Radiation therapy  Antimicrobials: Anti-infectives (From admission, onward)   Start     Dose/Rate Route Frequency  Ordered Stop   03/18/18 1000  fluconazole (DIFLUCAN) tablet 100 mg     100 mg Oral Daily 03/18/18 0743 03/27/18 2359   03/16/18 1600  fluconazole (DIFLUCAN) IVPB 100 mg  Status:  Discontinued     100 mg 50 mL/hr over 60 Minutes Intravenous Every 24 hours 03/16/18 1431 03/18/18 0743   03/15/18 2200  vancomycin (VANCOCIN) 1,250 mg in sodium chloride 0.9 % 250 mL IVPB  Status:  Discontinued     1,250 mg 166.7 mL/hr over 90 Minutes Intravenous Every 24 hours 03/14/18 1614 03/18/18 1310   03/14/18 2200  vancomycin (VANCOCIN) IVPB 750 mg/150 ml premix     750 mg 150 mL/hr over 60 Minutes Intravenous  Once 03/14/18 1614 03/14/18 2300   03/14/18 2200  piperacillin-tazobactam (ZOSYN) IVPB 3.375 g  Status:  Discontinued     3.375 g 12.5 mL/hr over 240 Minutes Intravenous Every 8 hours 03/14/18 1614 03/20/18 0750   03/14/18 1515  vancomycin (VANCOCIN) IVPB 1000 mg/200 mL premix     1,000 mg 200 mL/hr over 60 Minutes Intravenous  Once 03/14/18 1506 03/14/18 1644   03/14/18 1515  piperacillin-tazobactam (ZOSYN) IVPB 3.375 g     3.375 g 100 mL/hr over 30 Minutes Intravenous  Once 03/14/18 1506 03/14/18 1614        Objective: Vitals:   03/24/18 2111 03/25/18 0532 03/25/18 0806 03/25/18 1353  BP:  (!) 105/33  (!) 107/54  Pulse:  (!) 109  (!) 107  Resp:  18  18  Temp:    97.7 F (36.5 C)  TempSrc:      SpO2: 91% 95% 90% 98%  Weight:  71.5 kg (157 lb 10.1 oz)    Height:        Intake/Output Summary (Last 24 hours) at 03/25/2018 1400 Last data filed at 03/25/2018 1002 Gross per 24 hour  Intake 300 ml  Output 1500 ml  Net -1200 ml   Filed Weights   03/22/18 0636 03/24/18 0500 03/25/18 0532  Weight: 75.9 kg (167 lb 5.3 oz) 73.6 kg (162 lb 4.8 oz) 71.5 kg (157 lb 10.1 oz)    Examination:    General: Ill-looking, cachectic Cardiovascular: RRR, S1/S2 +, no rubs, no gallops Respiratory: Decreased breath sounds by bilaterally, no Abdominal: Soft, NT, ND, bowel sounds + Extremities:  Trace LE edema, no cyanosis Neuro: Lethargic, follow commands, oriented to person and place   Data Reviewed: I have personally reviewed following labs and imaging studies  CBC: Recent Labs  Lab 03/19/18 0502 03/20/18 0444 03/21/18 0508  WBC 12.8* 11.7* 10.6*  HGB 11.1* 11.1* 10.8*  HCT 34.4* 34.2* 33.1*  MCV 89.4 87.9 89.9  PLT 183 197 732   Basic Metabolic Panel: Recent Labs  Lab 03/20/18 0444 03/21/18 0508 03/22/18 0327 03/23/18 0323 03/25/18 0248  NA 134* 132* 133* 134* 134*  K 4.8 5.2* 5.0 5.0 6.0*  CL 103 103 103 104 105  CO2 20* 17* 19* 20* 18*  GLUCOSE 141* 205* 177* 182* 213*  BUN 22* 26* 29* 28* 42*  CREATININE 0.91 0.96 0.87 0.67 1.32*  CALCIUM 8.5* 8.8* 9.3 9.3 9.1  MG 2.0  --  2.1 2.2 2.3   GFR: Estimated Creatinine Clearance: 56.5 mL/min (A) (by C-G formula  based on SCr of 1.32 mg/dL (H)). Liver Function Tests: No results for input(s): AST, ALT, ALKPHOS, BILITOT, PROT, ALBUMIN in the last 168 hours. No results for input(s): LIPASE, AMYLASE in the last 168 hours. No results for input(s): AMMONIA in the last 168 hours. Coagulation Profile: No results for input(s): INR, PROTIME in the last 168 hours. Cardiac Enzymes: No results for input(s): CKTOTAL, CKMB, CKMBINDEX, TROPONINI in the last 168 hours. BNP (last 3 results) No results for input(s): PROBNP in the last 8760 hours. HbA1C: No results for input(s): HGBA1C in the last 72 hours. CBG: No results for input(s): GLUCAP in the last 168 hours. Lipid Profile: No results for input(s): CHOL, HDL, LDLCALC, TRIG, CHOLHDL, LDLDIRECT in the last 72 hours. Thyroid Function Tests: No results for input(s): TSH, T4TOTAL, FREET4, T3FREE, THYROIDAB in the last 72 hours. Anemia Panel: No results for input(s): VITAMINB12, FOLATE, FERRITIN, TIBC, IRON, RETICCTPCT in the last 72 hours. Sepsis Labs: No results for input(s): PROCALCITON, LATICACIDVEN in the last 168 hours.  Recent Results (from the past 240 hour(s))   MRSA PCR Screening     Status: None   Collection Time: 03/17/18  9:23 AM  Result Value Ref Range Status   MRSA by PCR NEGATIVE NEGATIVE Final    Comment:        The GeneXpert MRSA Assay (FDA approved for NASAL specimens only), is one component of a comprehensive MRSA colonization surveillance program. It is not intended to diagnose MRSA infection nor to guide or monitor treatment for MRSA infections. Performed at [facility], [place] [address] [place], [place] [address]       Radiology Studies: No results found.    Scheduled Meds: . chlorhexidine  15 mL Mouth Rinse BID  . enoxaparin (LOVENOX) injection  40 mg Subcutaneous Q24H  . [START ON 03/26/2018] feeding supplement  1 Container Oral Q24H  . fluconazole  100 mg Oral Daily  . ipratropium-albuterol  3 mL Nebulization TID  . magic mouthwash w/lidocaine  5 mL Oral TID  . mouth rinse  15 mL Mouth Rinse q12n4p  . mometasone   Topical Daily  . morphine  15 mg Oral Q12H  . multivitamin with minerals  1 tablet Oral Daily  . polyethylene glycol  17 g Oral Daily  . protein supplement shake  11 oz Oral Q24H  . senna-docusate  1 tablet Oral BID   Continuous Infusions:    LOS: 18 days    Time spent: Total of 25 minutes spent with pt, greater than 50% of which was spent in discussion of  treatment, counseling and coordination of care   Chipper Oman, MD Pager: Text Page via [email or web address]   If 7PM-7AM, please contact night-coverage [email or web address] 03/25/2018, 2:00 PM   Note - This record has been created using [employer]. Chart creation errors have been sought, but may not always have been located. Such creation errors do not reflect on the standard of medical care.

## 2018-03-25 NOTE — Care Management Important Message (Signed)
Important Message  Patient Details  Name: LEVERN PITTER MRN: [id number] Date of Birth: [birth year]   Medicare Important Message Given:  Yes    Kerin Salen 03/25/2018, 11:21 AMImportant Message  Patient Details  Name: KAIUS DAINO MRN: [id number] Date of Birth: [birth year]   Medicare Important Message Given:  Yes    Kerin Salen 03/25/2018, 11:21 AM

## 2018-03-25 NOTE — Progress Notes (Signed)
[facility] Therapy Treatment Record Phone [phone]   Radiation Therapy was administered to Kristopher Johnson on: 03/25/2018  2:37 PM and was treatment # 9 out of a planned course of 10 treatments.  Radiation Treatment  1). Beam photons with 6-10 energy  2). Brachytherapy None  3). Stereotactic Radiosurgery None  4). Other Radiation None     Bennett Scrape, RT (T)

## 2018-03-26 ENCOUNTER — Ambulatory Visit: Payer: Medicare Other

## 2018-03-26 LAB — BASIC METABOLIC PANEL
Anion gap: 13 (ref 5–15)
Anion gap: 9 (ref 5–15)
BUN: 56 mg/dL — ABNORMAL HIGH (ref 6–20)
BUN: 56 mg/dL — AB (ref 6–20)
CALCIUM: 8.6 mg/dL — AB (ref 8.9–10.3)
CHLORIDE: 107 mmol/L (ref 101–111)
CO2: 18 mmol/L — ABNORMAL LOW (ref 22–32)
CO2: 19 mmol/L — ABNORMAL LOW (ref 22–32)
CREATININE: 1.37 mg/dL — AB (ref 0.61–1.24)
CREATININE: 1.25 mg/dL — AB (ref 0.61–1.24)
Calcium: 9.2 mg/dL (ref 8.9–10.3)
Chloride: 109 mmol/L (ref 101–111)
GFR calc Af Amer: >60 mL/min (ref >60)
GFR, EST NON AFRICAN AMERICAN: 53 mL/min — AB (ref >60)
GFR, EST NON AFRICAN AMERICAN: 59 mL/min — AB (ref >60)
Glucose, Bld: 191 mg/dL — ABNORMAL HIGH (ref 65–99)
Glucose, Bld: 201 mg/dL — ABNORMAL HIGH (ref 65–99)
POTASSIUM: 5.8 mmol/L — AB (ref 3.5–5.1)
Potassium: 6.1 mmol/L — ABNORMAL HIGH (ref 3.5–5.1)
SODIUM: 138 mmol/L (ref 135–145)
SODIUM: 137 mmol/L (ref 135–145)

## 2018-03-26 LAB — HEPATIC FUNCTION PANEL
ALBUMIN: 2 g/dL — AB (ref 3.5–5.0)
ALT: 17 U/L (ref 17–63)
AST: 27 U/L (ref 15–41)
Alkaline Phosphatase: 65 U/L (ref 38–126)
BILIRUBIN DIRECT: 0.4 mg/dL (ref 0.1–0.5)
BILIRUBIN TOTAL: 1.2 mg/dL (ref 0.3–1.2)
Indirect Bilirubin: 0.8 mg/dL (ref 0.3–0.9)
Total Protein: 5 g/dL — ABNORMAL LOW (ref 6.5–8.1)

## 2018-03-26 LAB — CBC
HCT: 34.4 % — ABNORMAL LOW (ref 39.0–52.0)
HEMOGLOBIN: 11.1 g/dL — AB (ref 13.0–17.0)
MCH: 28.9 pg (ref 26.0–34.0)
MCHC: 32.3 g/dL (ref 30.0–36.0)
MCV: 89.6 fL (ref 78.0–100.0)
PLATELETS: 169 10*3/uL (ref 150–400)
RBC: 3.84 MIL/uL — AB (ref 4.22–5.81)
RDW: 16.5 % — ABNORMAL HIGH (ref 11.5–15.5)
WBC: 9.1 10*3/uL (ref 4.0–10.5)

## 2018-03-26 LAB — AMMONIA: AMMONIA: 25 umol/L (ref 9–35)

## 2018-03-26 LAB — MAGNESIUM: MAGNESIUM: 2.4 mg/dL (ref 1.7–2.4)

## 2018-03-26 LAB — POTASSIUM
POTASSIUM: 5.5 mmol/L — AB (ref 3.5–5.1)
Potassium: 5.4 mmol/L — ABNORMAL HIGH (ref 3.5–5.1)

## 2018-03-26 LAB — TSH: TSH: 4.493 u[IU]/mL (ref 0.350–4.500)

## 2018-03-26 MED ORDER — MEGESTROL ACETATE 400 MG/10ML PO SUSP
400.0000 mg | Freq: Every day | ORAL | Status: DC
  Administered 2018-03-29: 400 mg via ORAL
  Filled 2018-03-26 (×5): qty 10

## 2018-03-26 MED ORDER — CYANOCOBALAMIN 1000 MCG/ML IJ SOLN
1000.0000 ug | Freq: Once | INTRAMUSCULAR | Status: DC
  Filled 2018-03-26: qty 1

## 2018-03-26 MED ORDER — THIAMINE HCL 100 MG/ML IJ SOLN
100.0000 mg | Freq: Once | INTRAMUSCULAR | Status: AC
  Administered 2018-03-26: 100 mg via INTRAVENOUS
  Filled 2018-03-26: qty 2

## 2018-03-26 MED ORDER — SODIUM POLYSTYRENE SULFONATE 15 GM/60ML PO SUSP
15.0000 g | Freq: Once | ORAL | Status: AC
  Administered 2018-03-26: 15 g via ORAL
  Filled 2018-03-26: qty 60

## 2018-03-26 MED ORDER — SODIUM POLYSTYRENE SULFONATE 15 GM/60ML PO SUSP
30.0000 g | Freq: Once | ORAL | Status: DC
  Filled 2018-03-26: qty 120

## 2018-03-26 MED ORDER — SODIUM CHLORIDE 0.9 % IV BOLUS
500.0000 mL | Freq: Once | INTRAVENOUS | Status: AC
  Administered 2018-03-26: 500 mL via INTRAVENOUS

## 2018-03-26 NOTE — Progress Notes (Signed)
Pt talked with Dr.Patel and wanted to do radiation so this RN attempted to call them and got only a VM.  When pulled 4,5 and 6:00 PM meds to give patient while brother was there to witness, patient verbally refused to take any medication except the Thiamine that was administered through the port.  Patient's brother at bedside and also heard patient refuse to take the medication.  Told patient if he changed his mind to please let me know and would give it to him.  Patient nodded head in response.

## 2018-03-26 NOTE — Progress Notes (Signed)
   03/26/18 1441  Clinical Encounter Type  Visited With Patient;Family  Visit Type Follow-up  Spiritual Encounters  Spiritual Needs Emotional   Rounding on Palliative Patients and this is a follow up from a previous visit.  Encountered the patient's daughter in the hall and spent a little time with her.  Patient stated he was tired and when I said, it looks like you have a lot on your mind, he stated yes, but did not go any further.  I told him I know he did not want to go to radiation and he shook his head and said no.  I asked if he was done with all that and he said yes.  We spoke a little about his family, but appeared very tired and I asked if he wanted to move from the chair to the bed and he stated yes.  Nurses assisting him.  Will follow and support as needed. Chaplain Katherene Ponto

## 2018-03-26 NOTE — Progress Notes (Signed)
PROGRESS NOTE [facility]   Kristopher Johnson   ZOX:[id number] DOB: [birth year]  DOA: 02/25/2018 PCP: Patient, No Pcp Per   Brief Narrative:  Kristopher Johnson is an 62 y.o. 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  Radiation oncology was consulted and recommended 2 weeks of elective radiation.  We consulted hospice and palliative care and patient and would like to continue aggressive treatment.  The patient subsequently developed a sepsis-like picture, he was started empirically on IV vancomycin and cefepime and his picture seems to be improving, his remained afebrile, his leukocytosis is improving. He is also having his Pleurx catheter drain TID. He was tried on oral hydration and he was not able to keep up with the losses, therefore he was started on IV fluids, now his acute renal failure has improved his hypotension and tachycardia have also improved.  Subjective: Patient seen and examined, initially was sitting in the chair, in respiratory distress.  Refused his medication as well as pleural fluid removal earlier but after my convincing was agreeable for the same. Again refused to take radiation therapy today but later on was agreeable after convincing again. Feels he is not hungry.  Assessment & Plan: Mantle cell lymphoma of lymph nodes of multiple regions Patient on ibrutinib, rituxan q 3 weeks. Holding Venetoclax.  Oncology not recommending any systemic chemotherapy as patient is too frail for treatment.  Offered palliative radiation for 2 weeks in hopes of reducing pleural effusion and  improve respiratory status, however no much improvement has been noted.  Patient is continued to decline.   Dr. Quincy Johnson have discussed with Dr Kristopher Johnson, who is patient's niece and a podiatrist, who called on behalf of POA.  explained prognosis, condition and that this is not a curable disease, which seems to be getting worse day by day.  family members believe that he has improved from initial hospitalization and would like to continue aggressive treatment for now.  They are not interested on hospice care at this time.  Plan will be skilled nursing facility with palliative care consult.  Social worker has been consulted to start skilled nursing facility placement.  Cancer related pain  Pain is better controlled now however patient more somnolent and less responsive Will decrease pain medication doses to  MS Contin 15 mg twice daily, OxyContin IR5 mg every 4 hours for breakthrough pain and Tylenol thousand milligrams every 6 hours as needed.   CT head shows no acute abnormality at present.  Recurrent malignant pleural effusion on the left Continue to drain 3 times daily - patient refusing drainage at times Continue palliative radiotherapy total of 2 weeks  Acute kidney injury -hyperkalemia Stable, monitor. Renal function worsened, will give Kayexalate and recheck potassium every 6 hours.  Leukocytosis - improved   No signs of sepsis, no source of infection.  No fever Patient was started empirically on IV vancomycin and Zosyn, blood cultures negative so far. MRSA PCR negative.  Continue to monitor off antibiotics. Check CBC in AM   Orthostatic hypotension Improved   Dysphagia Diflucan x 7 days completed Continue Magic mouth wash - refusing at times   DVT prophylaxis:  Lovenox Code Status: DNR Family Communication: None at bedside Disposition Plan: SNF in 2-3 days   Consultants:   Oncology  Radiation oncology  Palliative care  Procedures:   Radiation  therapy  Antimicrobials: Anti-infectives (From admission, onward)   Start     Dose/Rate Route Frequency Ordered Stop   03/18/18 1000  fluconazole (DIFLUCAN) tablet 100 mg  Status:  Discontinued     100 mg Oral Daily 03/18/18 0743 03/26/18 1101   03/16/18 1600  fluconazole (DIFLUCAN) IVPB 100 mg  Status:  Discontinued     100 mg 50 mL/hr over 60 Minutes Intravenous Every 24 hours 03/16/18 1431 03/18/18 0743   03/15/18 2200  vancomycin (VANCOCIN) 1,250 mg in sodium chloride 0.9 % 250 mL IVPB  Status:  Discontinued     1,250 mg 166.7 mL/hr over 90 Minutes Intravenous Every 24 hours 03/14/18 1614 03/18/18 1310   03/14/18 2200  vancomycin (VANCOCIN) IVPB 750 mg/150 ml premix     750 mg 150 mL/hr over 60 Minutes Intravenous  Once 03/14/18 1614 03/14/18 2300   03/14/18 2200  piperacillin-tazobactam (ZOSYN) IVPB 3.375 g  Status:  Discontinued     3.375 g 12.5 mL/hr over 240 Minutes Intravenous Every 8 hours 03/14/18 1614 03/20/18 0750   03/14/18 1515  vancomycin (VANCOCIN) IVPB 1000 mg/200 mL premix     1,000 mg 200 mL/hr over 60 Minutes Intravenous  Once 03/14/18 1506 03/14/18 1644   03/14/18 1515  piperacillin-tazobactam (ZOSYN) IVPB 3.375 g     3.375 g 100 mL/hr over 30 Minutes Intravenous  Once 03/14/18 1506 03/14/18 1614        Objective: Vitals:   03/25/18 1353 03/25/18 2209 03/26/18 0506 03/26/18 1319  BP: (!) 107/54 (!) 82/53 (!) 107/48 103/63  Pulse: (!) 107 (!) 122 (!) 113 (!) 109  Resp: 18 12 14 17   Temp: 97.7 F (36.5 C) 98 F (36.7 C) (!) 97.5 F (36.4 C) 98.7 F (37.1 C)  TempSrc:  Oral  Oral  SpO2:   97% 99%  Weight:   71.5 kg (157 lb 10.1 oz)   Height:        Intake/Output Summary (Last 24 hours) at 03/26/2018 1759 Last data filed at 03/26/2018 1655 Gross per 24 hour  Intake 2490.01 ml  Output 1650 ml  Net 840.01 ml   Filed Weights   03/24/18 0500 03/25/18 0532 03/26/18 0506  Weight: 73.6 kg (162 lb 4.8 oz) 71.5 kg (157 lb 10.1 oz) 71.5 kg (157 lb 10.1 oz)     Examination:    General: Ill-looking, cachectic Cardiovascular: RRR, S1/S2 +, no rubs, no gallops Respiratory: Decreased breath sounds by bilaterally, no Abdominal: Soft, NT, ND, bowel sounds + Extremities: Trace LE edema, no cyanosis Neuro: Lethargic, follow commands, oriented to person and place   Data Reviewed: I have personally reviewed following labs and imaging studies  CBC: Recent Labs  Lab 03/20/18 0444 03/21/18 0508 03/26/18 0434  WBC 11.7* 10.6* 9.1  HGB 11.1* 10.8* 11.1*  HCT 34.2* 33.1* 34.4*  MCV 87.9 89.9 89.6  PLT 197 195 683   Basic Metabolic Panel: Recent Labs  Lab 03/20/18 0444  03/22/18 0327 03/23/18 0323 03/25/18 0248 03/26/18 0434 03/26/18 0855 03/26/18 1515  NA 134*   < > 133* 134* 134* 138 137  --   K 4.8   < > 5.0 5.0 6.0* 6.1* 5.8* 5.4*  CL 103   < > 103 104 105 107 109  --   CO2 20*   < >  19* 20* 18* 18* 19*  --   GLUCOSE 141*   < > 177* 182* 213* 191* 201*  --   BUN 22*   < > 29* 28* 42* 56* 56*  --   CREATININE 0.91   < > 0.87 0.67 1.32* 1.37* 1.25*  --   CALCIUM 8.5*   < > 9.3 9.3 9.1 9.2 8.6*  --   MG 2.0  --  2.1 2.2 2.3 2.4  --   --    < > = values in this interval not displayed.   GFR: Estimated Creatinine Clearance: 59.7 mL/min (A) (by C-G formula based on SCr of 1.25 mg/dL (H)). Liver Function Tests: No results for input(s): AST, ALT, ALKPHOS, BILITOT, PROT, ALBUMIN in the last 168 hours. No results for input(s): LIPASE, AMYLASE in the last 168 hours. No results for input(s): AMMONIA in the last 168 hours. Coagulation Profile: No results for input(s): INR, PROTIME in the last 168 hours. Cardiac Enzymes: No results for input(s): CKTOTAL, CKMB, CKMBINDEX, TROPONINI in the last 168 hours. BNP (last 3 results) No results for input(s): PROBNP in the last 8760 hours. HbA1C: No results for input(s): HGBA1C in the last 72 hours. CBG: No results for input(s): GLUCAP in the last 168 hours. Lipid Profile: No results for  input(s): CHOL, HDL, LDLCALC, TRIG, CHOLHDL, LDLDIRECT in the last 72 hours. Thyroid Function Tests: No results for input(s): TSH, T4TOTAL, FREET4, T3FREE, THYROIDAB in the last 72 hours. Anemia Panel: No results for input(s): VITAMINB12, FOLATE, FERRITIN, TIBC, IRON, RETICCTPCT in the last 72 hours. Sepsis Labs: No results for input(s): PROCALCITON, LATICACIDVEN in the last 168 hours.  Recent Results (from the past 240 hour(s))  MRSA PCR Screening     Status: None   Collection Time: 03/17/18  9:23 AM  Result Value Ref Range Status   MRSA by PCR NEGATIVE NEGATIVE Final    Comment:        The GeneXpert MRSA Assay (FDA approved for NASAL specimens only), is one component of a comprehensive MRSA colonization surveillance program. It is not intended to diagnose MRSA infection nor to guide or monitor treatment for MRSA infections. Performed at [facility], [place] [address], [place], [place] [address]       Radiology Studies: Ct Head Wo Contrast  Result Date: 03/25/2018 CLINICAL DATA:  Altered level of consciousness. History of mantle cell lymphoma. EXAM: CT HEAD WITHOUT CONTRAST TECHNIQUE: Contiguous axial images were obtained from the base of the skull through the vertex without intravenous contrast. COMPARISON:  None. FINDINGS: Brain: No evidence of acute infarction, hemorrhage, hydrocephalus, extra-axial collection or mass lesion/mass effect. Vascular: No hyperdense vessel or unexpected calcification. Skull: Normal. Negative for fracture or focal lesion. Sinuses/Orbits: No acute finding. Other: None. IMPRESSION: Normal head CT. Electronically Signed   By: Marijo Conception, M.D.   On: 03/25/2018 14:33      Scheduled Meds: . cyanocobalamin  1,000 mcg Subcutaneous Once  . enoxaparin (LOVENOX) injection  40 mg Subcutaneous Q24H  . feeding supplement  1 Container Oral Q24H  . ipratropium-albuterol  3 mL Nebulization TID  . magic mouthwash w/lidocaine  5 mL Oral TID   . mouth rinse  15 mL Mouth Rinse q12n4p  . megestrol  400 mg Oral Daily  . mometasone   Topical Daily  . morphine  15 mg Oral Q12H  . multivitamin with minerals  1 tablet Oral Daily  . polyethylene glycol  17 g Oral Daily  . protein supplement  shake  11 oz Oral Q24H  . senna-docusate  1 tablet Oral BID   Continuous Infusions: . sodium chloride 50 mL/hr at 03/26/18 1420     LOS: 19 days    Time spent: Total of 35 minutes spent with pt, greater than 50% of which was spent in discussion of  treatment, counseling and coordination of care   Berle Mull, MD Pager: Text Page via [email or web address]   If 7PM-7AM, please contact night-coverage [email or web address] 03/26/2018, 5:59 PM   Note - This record has been created using [employer]. Chart creation errors have been sought, but may not always have been located. Such creation errors do not reflect on the standard of medical care.

## 2018-03-26 NOTE — Progress Notes (Signed)
Pt was supposed to go to radiation for 12:30 but when they came to get him he refused.  I discussed with patient the pros and cons of him refusing and also had my Charge Nurse Kristopher Johnson talk with him as well.  Pt has been refusing multiple medications throughout shift along with his Zigmund Daniel exaltate that he did not complete.  Explained to him the risks and benefits and made the MD aware of that along with refusal to go to radiation.  Will continue to monitor

## 2018-03-26 NOTE — Progress Notes (Signed)
Patient's RN and Agricultural consultant both talked with patient about going to radiation today and the importance of why it will benefit him. Even after explaining to patient he still refuses to go. Even transport told nurses and patient he believed it was his last one and patient still refused. MD made aware. Will continue to monitor patient.  Carmela Hurt, RN

## 2018-03-26 NOTE — Progress Notes (Signed)
Physical Therapy Treatment Patient Details Name: Kristopher Johnson MRN: [id number] DOB: 03/10/1956 Today's Date: 03/26/2018    History of Present Illness 62 year old man was admitted with dizziness; SOB with exertion.  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    PT Comments    Limited session. Pt declined OOB and exercises. He only agreed to sit up in bed in long-sitting position. RN reported pt had been up to chair earlier today. Will continue to progress activity as able. Recommend SNF.    Follow Up Recommendations  SNF     Equipment Recommendations  (TBD at next venue)    Recommendations for Other Services       Precautions / Restrictions Precautions Precautions: Fall Precaution Comments: gets orthostatic, watch HR Restrictions Weight Bearing Restrictions: No    Mobility  Bed Mobility Overal bed mobility: Needs Assistance Bed Mobility: Supine to Sit;Sit to Supine           General bed mobility comments: Began encouraging pt to move LEs off of bed. Pt stopped moving legs then stated he just wanted to sit up. He used railings to pull up into long sitting. He sat in long sitting for several minutes before returning to supine.   Transfers                 General transfer comment: He declined OOB  Ambulation/Gait                 Stairs             Wheelchair Mobility    Modified Rankin (Stroke Patients Only)       Balance                                            Cognition Arousal/Alertness: Awake/alert(but drowsy/"a little out of it") Behavior During Therapy: Flat affect Overall Cognitive Status: Impaired/Different from baseline Area of Impairment: Problem solving;Safety/judgement                         Safety/Judgement: Decreased awareness of safety   Problem Solving: Slow processing;Requires verbal cues        Exercises       General Comments        Pertinent Vitals/Pain Pain Assessment: No/denies pain    Home Living                      Prior Function            PT Goals (current goals can now be found in the care plan section) Progress towards PT goals: Progressing toward goals    Frequency    Min 2X/week      PT Plan Current plan remains appropriate    Co-evaluation              AM-PAC PT "6 Clicks" Daily Activity  Outcome Measure  Difficulty turning over in bed (including adjusting bedclothes, sheets and blankets)?: A Little Difficulty moving from lying on back to sitting on the side of the bed? : A Little Difficulty sitting down on and standing up from a chair with arms (e.g., wheelchair, bedside commode, etc,.)?: A Little Help needed moving to and from a bed to chair (including a wheelchair)?: A Little Help needed walking in  hospital room?: A Lot Help needed climbing 3-5 steps with a railing? : A Lot 6 Click Score: 16    End of Session   Activity Tolerance: Patient limited by fatigue Patient left: in bed;with call bell/phone within reach;with family/visitor present;with bed alarm set   PT Visit Diagnosis: Other abnormalities of gait and mobility (R26.89);Muscle weakness (generalized) (M62.81)     Time: 4097-3532 PT Time Calculation (min) (ACUTE ONLY): 8 min  Charges:  $Therapeutic Activity: 8-22 mins                    G Codes:          Weston Anna, MPT Pager: [phone]

## 2018-03-27 ENCOUNTER — Ambulatory Visit
Admit: 2018-03-27 | Discharge: 2018-03-27 | Disposition: A | Discharge status: Home / Self Care | Payer: Medicare Other | Attending: Radiation Oncology | Admitting: Radiation Oncology

## 2018-03-27 ENCOUNTER — Encounter: Payer: Self-pay | Admitting: Radiation Oncology

## 2018-03-27 LAB — CBC WITH DIFFERENTIAL/PLATELET
BASOS PCT: 0 %
Basophils Absolute: 0 10*3/uL (ref 0.0–0.1)
Eosinophils Absolute: 0 10*3/uL (ref 0.0–0.7)
Eosinophils Relative: 0 %
HEMATOCRIT: 33.6 % — AB (ref 39.0–52.0)
HEMOGLOBIN: 10.9 g/dL — AB (ref 13.0–17.0)
LYMPHS PCT: 2 %
Lymphs Abs: 0.1 10*3/uL — ABNORMAL LOW (ref 0.7–4.0)
MCH: 28.8 pg (ref 26.0–34.0)
MCHC: 32.4 g/dL (ref 30.0–36.0)
MCV: 88.9 fL (ref 78.0–100.0)
MONO ABS: 0.3 10*3/uL (ref 0.1–1.0)
Monocytes Relative: 4 %
NEUTROS ABS: 6.9 10*3/uL (ref 1.7–7.7)
NEUTROS PCT: 94 %
Platelets: 159 10*3/uL (ref 150–400)
RBC: 3.78 MIL/uL — AB (ref 4.22–5.81)
RDW: 16.6 % — AB (ref 11.5–15.5)
WBC: 7.4 10*3/uL (ref 4.0–10.5)

## 2018-03-27 LAB — LACTIC ACID, PLASMA: Lactic Acid, Venous: 1.6 mmol/L (ref 0.5–1.9)

## 2018-03-27 LAB — COMPREHENSIVE METABOLIC PANEL
ALBUMIN: 1.9 g/dL — AB (ref 3.5–5.0)
ALK PHOS: 65 U/L (ref 38–126)
ALT: 16 U/L — AB (ref 17–63)
AST: 15 U/L (ref 15–41)
Anion gap: 10 (ref 5–15)
BILIRUBIN TOTAL: 0.7 mg/dL (ref 0.3–1.2)
BUN: 56 mg/dL — AB (ref 6–20)
CALCIUM: 9.2 mg/dL (ref 8.9–10.3)
CO2: 18 mmol/L — ABNORMAL LOW (ref 22–32)
CREATININE: 0.98 mg/dL (ref 0.61–1.24)
Chloride: 112 mmol/L — ABNORMAL HIGH (ref 101–111)
GFR calc Af Amer: >60 mL/min (ref >60)
GLUCOSE: 186 mg/dL — AB (ref 65–99)
Potassium: 5.4 mmol/L — ABNORMAL HIGH (ref 3.5–5.1)
Sodium: 140 mmol/L (ref 135–145)
Total Protein: 5 g/dL — ABNORMAL LOW (ref 6.5–8.1)

## 2018-03-27 LAB — MAGNESIUM: Magnesium: 2.5 mg/dL — ABNORMAL HIGH (ref 1.7–2.4)

## 2018-03-27 LAB — BETA-HYDROXYBUTYRIC ACID: Beta-Hydroxybutyric Acid: 1.84 mmol/L — ABNORMAL HIGH (ref 0.05–0.27)

## 2018-03-27 MED ORDER — PATIROMER SORBITEX CALCIUM 8.4 G PO PACK
8.4000 g | PACK | Freq: Every day | ORAL | Status: DC
  Administered 2018-03-27: 8.4 g via ORAL
  Filled 2018-03-27 (×2): qty 1

## 2018-03-27 MED ORDER — ALBUMIN HUMAN 25 % IV SOLN
12.5000 g | Freq: Once | INTRAVENOUS | Status: AC
  Administered 2018-03-27: 12.5 g via INTRAVENOUS
  Filled 2018-03-27: qty 50

## 2018-03-27 MED ORDER — TRAMADOL HCL 50 MG PO TABS
50.0000 mg | ORAL_TABLET | Freq: Two times a day (BID) | ORAL | Status: DC
  Administered 2018-03-27 – 2018-03-29 (×4): 50 mg via ORAL
  Filled 2018-03-27 (×4): qty 1

## 2018-03-27 MED ORDER — DEXTROSE-NACL 5-0.45 % IV SOLN
INTRAVENOUS | Status: DC
  Administered 2018-03-27 – 2018-03-29 (×4): via INTRAVENOUS

## 2018-03-27 MED ORDER — TRAMADOL HCL 50 MG PO TABS: 50.0000 mg | ORAL_TABLET | Freq: Two times a day (BID) | ORAL | Status: DC

## 2018-03-27 NOTE — Progress Notes (Signed)
CSW contacted patient's HCPOA Rosamaria Lints [phone]) to discuss patient's discharge planning, no answer. CSW left voicemail requesting return phone call. Patient currently oriented x person and unable to participate in discharge planning, CSW awaiting return call from patient's HCPOA.  CSW will continue to follow and assist with discharge planning.  Abundio Miu, [place] Social Worker [facility] Cell#: [phone]

## 2018-03-27 NOTE — Clinical Social Work Note (Signed)
Clinical Social Work Assessment  Patient Details  Name: Kristopher Johnson MRN: [id number] Date of Birth: Jan 29, 1956  Date of referral:  03/27/18               Reason for consult:  Facility Placement, End of Life/Hospice                Permission sought to share information with:    Permission granted to share information::     Name::        Agency::     Relationship::     Contact Information:     Housing/Transportation Living arrangements for the past 2 months:  Single Family Home Source of Information:  Power of Attorney Patient Interpreter Needed:  None Criminal Activity/Legal Involvement Pertinent to Current Situation/Hospitalization:  No - Comment as needed Significant Relationships:  Siblings, Adult Children, Other Family Members Lives with:  Adult Children Do you feel safe going back to the place where you live?    Need for family participation in patient care:  Yes (Comment)  Care giving concerns:  Patient from home with daughter. Per patient's HCPOA/cousin reported patient's cancer progressed unexpectedly and patient nor family was prepared for patient's current condition. PT recommended SNF;24 hour supervision.     Social Worker assessment / plan:  CSW spoke with patient's HCPOA Kristopher Johnson [phone]) regarding patient's discharge planning. Patient's HCPOA reported that they are interested in SNF with Hospice. CSW explained SNF with Hospice payor sources. Patient's HCPOA reported that patient does not have a [facility] term care policy and is not able to private pay for SNF with hospice. CSW and patient's HCPOA talked about other discharge plan options for patient. Patient's HCPOA reported that patient is unable to return home with hospice and that she is agreeable to residential hospice for patient. Patient's HCPOA reported that she wants patient's daughter and siblings to be involved in decision making process, noting that she is only patient's cousin. Patient's HCPOA reported  that patient's brother Kristopher Johnson [phone]) is currently at the hospital and patient's sister Kristopher Johnson [phone]) is flying into the hospital tomorrow. Patient's HCPOA reported that patient's daughter lives in [place] and that the residential hospice facility would need to be close to daughter so she can visit patient. CSW agreed to follow up with patient's family and discuss patient potentially discharging to residential hospice. CSW agreed to follow up with patient's HCPOA after speaking with patient's other family members, patient's HCPOA agreeable.  CSW will follow up with patient's family to discuss residential hospice then update HCPOA.   Employment status:  Disabled (Comment on whether or not currently receiving Disability) Insurance information:  Medicare PT Recommendations:  [place], 24 Hour Supervision Information / Referral to community resources:  [place], Other (Comment Required)(Residential Hospice)  Patient/Family's Response to care:  Patient's Publishing rights manager of CSW assistance with discharge planning.   Patient/Family's Understanding of and Emotional Response to Diagnosis, Current Treatment, and Prognosis:  Patient's HCPOA lives in [place] and is involved in patient's care remotely. Patient's HCPOA verbalized understanding of Medicare not covering SNF with hospice and agreeable to patient dc to residential hospice if other family members are agreeable.   Emotional Assessment Appearance:  Appears older than stated age Attitude/Demeanor/Rapport:  Unable to Assess Affect (typically observed):  Unable to Assess Orientation:  Oriented to Self Alcohol / Substance use:  Not Applicable Psych involvement (Current and /or in the community):  No (Comment)  Discharge Needs  Concerns to be addressed:  Care Coordination Readmission within the last 30 days:    Current discharge risk:  Terminally ill Barriers to Discharge:  Continued  Medical Work up   [employer], LCSW 03/27/2018, 4:19 PM

## 2018-03-27 NOTE — Progress Notes (Signed)
Per family request. They would like it noted that patient likes room to be at 5 degrees F.

## 2018-03-27 NOTE — Progress Notes (Signed)
PROGRESS NOTE [facility]   Kristopher Johnson   ASN:[id number] DOB: [birth year]  DOA: 02/15/2018 PCP: Patient, No Pcp Per   Brief Narrative:  Kristopher Johnson is an 62 y.o. 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  Radiation oncology was consulted and recommended 2 weeks of elective radiation.  We consulted hospice and palliative care and patient and would like to continue aggressive treatment.  The patient subsequently developed a sepsis-like picture, he was started empirically on IV vancomycin and cefepime and his picture seems to be improving, his remained afebrile, his leukocytosis is improving. He is also having his Pleurx catheter drain TID. He was tried on oral hydration and he was not able to keep up with the losses, therefore he was started on IV fluids, now his acute renal failure has improved his hypotension and tachycardia have also improved.  Subjective: Patient in respiratory distress, denies any pain no nausea no vomiting.  Has occasional cough.  Heart rate has been running 120s to 130s sinus.  Assessment & Plan: Mantle cell lymphoma of lymph nodes of multiple regions Patient on ibrutinib, rituxan q 3 weeks. Holding Venetoclax.  Oncology not recommending any systemic chemotherapy as patient is too frail for treatment.  Offered palliative radiation for 2 weeks in hopes of reducing pleural effusion and improve respiratory status, however no much improvement has been noted.  Patient is continued to decline.   I have discussed with Dr Kathreen Devoid, who is patient's niece and a  podiatrist, who called on behalf of POA.  explained prognosis, condition and that this is not a curable disease, which seems to be getting worse day by day.  I also discussed the patient's power of attorney Ms. Autumn Patty. Patient has terminal illness with third recurrence of his lymphoma for which he is not strong enough to tolerate any chemotherapy and appears to have progressive symptoms from his lymphoma. Patient is having difficulty participating in his care and so that is refusing many of the medications, therapy, occasionally radiation requiring extensive persuasion. Patient has minimal p.o. intake, metabolic acidosis likely from starvation due to anorexia from cancer. Patient did not want any feeding tube, after providing above information to Dr. Florene Glen as well as Ms. Autumn Patty they decided to transition patient's care to focus more on comfort although there would still like to be discharged to SNF as opposed to inpatient hospice. Goal of care is to not to return to hospital and continue aggressive comfort measures. Social worker consulted, will follow up on planning.  Cancer related pain  Pain is better controlled now however patient more somnolent and less responsive Will decrease pain medication doses to  MS Contin 15 mg twice daily, OxyContin IR5 mg every 4 hours for breakthrough pain and Tylenol thousand milligrams every 6 hours as needed.   CT head shows no acute abnormality at present. Adding tramadol as scheduled for catheter drainage.  Recurrent malignant pleural effusion on the left Continue to drain 3 times daily - patient refusing drainage at times Continue palliative radiotherapy (cycle, last cycle 03/27/2018.  Acute kidney injury -hyperkalemia Stable, monitor. Renal function worsened and after hydration improved. Currently serum creatinine  close to normal BUN is about the same.  But patient does have persistent hyperkalemia. This appears to be secondary to metabolic acidosis, will  start on patiromer.  Leukocytosis - improved   No signs of sepsis, no source of infection.  No fever Patient was started empirically on IV vancomycin and Zosyn, blood cultures negative so far. MRSA PCR negative.  Continue to monitor off antibiotics. Check CBC in AM   Orthostatic hypotension Improved   Dysphagia Diflucan x 7 days completed Continue Magic mouth wash - refusing at times   DVT prophylaxis: Lovenox, DC due to being on comfort measures. Code Status: DNR Family Communication: None at bedside Disposition Plan: SNF in 2-3 days   Consultants:   Oncology  Radiation oncology  Palliative care  Procedures:   Radiation therapy  Antimicrobials: Anti-infectives (From admission, onward)   Start     Dose/Rate Route Frequency Ordered Stop   03/18/18 1000  fluconazole (DIFLUCAN) tablet 100 mg  Status:  Discontinued     100 mg Oral Daily 03/18/18 0743 03/26/18 1101   03/16/18 1600  fluconazole (DIFLUCAN) IVPB 100 mg  Status:  Discontinued     100 mg 50 mL/hr over 60 Minutes Intravenous Every 24 hours 03/16/18 1431 03/18/18 0743   03/15/18 2200  vancomycin (VANCOCIN) 1,250 mg in sodium chloride 0.9 % 250 mL IVPB  Status:  Discontinued     1,250 mg 166.7 mL/hr over 90 Minutes Intravenous Every 24 hours 03/14/18 1614 03/18/18 1310   03/14/18 2200  vancomycin (VANCOCIN) IVPB 750 mg/150 ml premix     750 mg 150 mL/hr over 60 Minutes Intravenous  Once 03/14/18 1614 03/14/18 2300   03/14/18 2200  piperacillin-tazobactam (ZOSYN) IVPB 3.375 g  Status:  Discontinued     3.375 g 12.5 mL/hr over 240 Minutes Intravenous Every 8 hours 03/14/18 1614 03/20/18 0750   03/14/18 1515  vancomycin (VANCOCIN) IVPB 1000 mg/200 mL premix     1,000 mg 200 mL/hr over 60 Minutes Intravenous  Once 03/14/18 1506 03/14/18 1644   03/14/18 1515  piperacillin-tazobactam (ZOSYN) IVPB 3.375 g     3.375 g 100 mL/hr over 30 Minutes Intravenous  Once 03/14/18 1506 03/14/18 1614         Objective: Vitals:   03/27/18 0503 03/27/18 0848 03/27/18 1343 03/27/18 1537  BP: (!) 125/57  103/63   Pulse: (!) 113  (!) 135   Resp: (!) 22  16   Temp:   97.8 F (36.6 C)   TempSrc:   Axillary   SpO2: 94% 95% 94% 94%  Weight: 71 kg (156 lb 8.4 oz)     Height:        Intake/Output Summary (Last 24 hours) at 03/27/2018 1651 Last data filed at 03/27/2018 1500 Gross per 24 hour  Intake 1509.17 ml  Output 2100 ml  Net -590.83 ml   Filed Weights   03/25/18 0532 03/26/18 0506 03/27/18 0503  Weight: 71.5 kg (157 lb 10.1 oz) 71.5 kg (157 lb 10.1 oz) 71 kg (156 lb 8.4 oz)    Examination:    General: Ill-looking, cachectic Cardiovascular: RRR, S1/S2 +, no rubs, no gallops Respiratory: Decreased breath sounds by bilaterally, no Abdominal: Soft, NT, ND, bowel sounds + Extremities: Trace LE edema, no cyanosis Neuro: Lethargic, follow commands, oriented to person and place   Data Reviewed: I have personally reviewed following labs and imaging studies  CBC: Recent Labs  Lab 03/21/18 0508 03/26/18 0434 03/27/18 0438  WBC 10.6* 9.1 7.4  NEUTROABS  --   --  6.9  HGB 10.8* 11.1* 10.9*  HCT 33.1* 34.4* 33.6*  MCV 89.9 89.6 88.9  PLT 195 169 387   Basic Metabolic Panel: Recent Labs  Lab 03/22/18 0327 03/23/18 0323 03/25/18 0248 03/26/18 0434 03/26/18 0855 03/26/18 1515 03/26/18 2118 03/27/18 0438  NA 133* 134* 134* 138 137  --   --  140  K 5.0 5.0 6.0* 6.1* 5.8* 5.4* 5.5* 5.4*  CL 103 104 105 107 109  --   --  112*  CO2 19* 20* 18* 18* 19*  --   --  18*  GLUCOSE 177* 182* 213* 191* 201*  --   --  186*  BUN 29* 28* 42* 56* 56*  --   --  56*  CREATININE 0.87 0.67 1.32* 1.37* 1.25*  --   --  0.98  CALCIUM 9.3 9.3 9.1 9.2 8.6*  --   --  9.2  MG 2.1 2.2 2.3 2.4  --   --   --  2.5*   GFR: Estimated Creatinine Clearance: 76.2 mL/min (by C-G formula based on SCr of 0.98 mg/dL). Liver Function Tests: Recent Labs  Lab 03/26/18 2118 03/27/18 0438  AST 27 15  ALT 17  16*  ALKPHOS 65 65  BILITOT 1.2 0.7  PROT 5.0* 5.0*  ALBUMIN 2.0* 1.9*   No results for input(s): LIPASE, AMYLASE in the last 168 hours. Recent Labs  Lab 03/26/18 2118  AMMONIA 25   Coagulation Profile: No results for input(s): INR, PROTIME in the last 168 hours. Cardiac Enzymes: No results for input(s): CKTOTAL, CKMB, CKMBINDEX, TROPONINI in the last 168 hours. BNP (last 3 results) No results for input(s): PROBNP in the last 8760 hours. HbA1C: No results for input(s): HGBA1C in the last 72 hours. CBG: No results for input(s): GLUCAP in the last 168 hours. Lipid Profile: No results for input(s): CHOL, HDL, LDLCALC, TRIG, CHOLHDL, LDLDIRECT in the last 72 hours. Thyroid Function Tests: Recent Labs    03/26/18 2118  TSH 4.493   Anemia Panel: No results for input(s): VITAMINB12, FOLATE, FERRITIN, TIBC, IRON, RETICCTPCT in the last 72 hours. Sepsis Labs: Recent Labs  Lab 03/27/18 1000  LATICACIDVEN 1.6    No results found for this or any previous visit (from the past 240 hour(s)).    Radiology Studies: No results found.    Scheduled Meds: . feeding supplement  1 Container Oral Q24H  . ipratropium-albuterol  3 mL Nebulization TID  . magic mouthwash w/lidocaine  5 mL Oral TID  . mouth rinse  15 mL Mouth Rinse q12n4p  . megestrol  400 mg Oral Daily  . mometasone   Topical Daily  . morphine  15 mg Oral Q12H  . multivitamin with minerals  1 tablet Oral Daily  . patiromer  8.4 g Oral Daily  . polyethylene glycol  17 g Oral Daily  . protein supplement shake  11 oz Oral Q24H  . senna-docusate  1 tablet Oral BID  . traMADol  50 mg Oral Q12H   Continuous Infusions: . albumin human    . dextrose 5 % and 0.45% NaCl 50 mL/hr at 03/27/18 1006     LOS: 20 days    Time spent: Total of 35 minutes spent with pt, greater than 50% of which was spent in discussion of  treatment, counseling and coordination of care   Berle Mull, MD Pager: Text Page via [email or web address]    If 7PM-7AM, please contact night-coverage [email or web address] 03/27/2018, 4:51 PM   Note - This record has  been created using [employer]. Chart creation errors have been sought, but may not always have been located. Such creation errors do not reflect on the standard of medical care.

## 2018-03-27 NOTE — Progress Notes (Signed)
[facility] Therapy Treatment Record Phone [phone]   Radiation Therapy was administered to [name] on: 03/27/2018  9:21 AM and was treatment # 10 out of a planned course of 10 treatments.  Radiation Treatment  1). Beam photons with 6-10 energy  2). Brachytherapy None  3). Stereotactic Radiosurgery None  4). Other Radiation None     Bennett Scrape, RT (T)

## 2018-03-28 MED ORDER — MORPHINE SULFATE (CONCENTRATE) 10 MG/0.5ML PO SOLN
5.0000 mg | ORAL | Status: DC | PRN
  Administered 2018-03-29 – 2018-03-30 (×3): 10 mg via SUBLINGUAL
  Filled 2018-03-28 (×3): qty 0.5

## 2018-03-28 NOTE — Progress Notes (Signed)
CSW contacted patient's daughter Karlene Lineman [phone]) and discussed patient's discharge planning. CSW informed patient's daughter that patient's HCPOA reported that she is agreeable to residential hospice if patient's other family members are in agreement. Patient's daughter reported that she is in agreement to residential hospice if it is affordable for patient. CSW explained to patient's daughter that CSW would contact the other family members per [facility] request and would follow up with daughter after speaking with other family members. Patient's daughter reported that patient's brother would be getting on a plane at 11am and that patient's sister would be arriving to [facility] at 2:30PM today.   CSW contacted patient's brother Chawn Spraggins [phone]), no answer. CSW left voicemail requesting return phone call.  CSW will follow up with patient's sister when she arrives to hospital.  CSW will continue to follow and assist with discharge planning to residential hospice.  Abundio Miu, [place] Social Worker [facility] Cell#: [phone]

## 2018-03-28 NOTE — Progress Notes (Signed)
PROGRESS NOTE [facility]   Kristopher Johnson   WNU:[id number] DOB: 02/27/1956  DOA: 03/01/2018 PCP: Patient, No Pcp Per   Brief Narrative:  Kristopher Johnson is an 62 y.o. 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  Radiation oncology was consulted and recommended 2 weeks of elective radiation.  We consulted hospice and palliative care and patient and would like to continue aggressive treatment.  The patient subsequently developed a sepsis-like picture, he was started empirically on IV vancomycin and cefepime and his picture seems to be improving, his remained afebrile, his leukocytosis is improving. He is also having his Pleurx catheter drain TID. He was tried on oral hydration and he was not able to keep up with the losses, therefore he was started on IV fluids, now his acute renal failure has improved his hypotension and tachycardia have also improved.  Subjective: Still short of breath.  No nausea no vomiting.  Refused to take oral medications.  Assessment & Plan: Mantle cell lymphoma of lymph nodes of multiple regions Patient on ibrutinib, rituxan q 3 weeks. Holding Venetoclax.  Oncology not recommending any systemic chemotherapy as patient is too frail for treatment.  Offered palliative radiation for 2 weeks in hopes of reducing pleural effusion and improve respiratory status, however no much improvement has been noted.  Patient is continued to decline.   I have discussed with Dr Kathreen Devoid, who is patient's niece and a podiatrist, who called on behalf of POA.  explained prognosis,  condition and that this is not a curable disease, which seems to be getting worse day by day.  I also discussed the patient's power of attorney Ms. Autumn Patty. Patient has terminal illness with third recurrence of his lymphoma for which he is not strong enough to tolerate any chemotherapy and appears to have progressive symptoms from his lymphoma. Patient is having difficulty participating in his care and so that is refusing many of the medications, therapy, occasionally radiation requiring extensive persuasion. Patient has minimal p.o. intake, metabolic acidosis likely from starvation due to anorexia from cancer. Patient did not want any feeding tube, after providing above information to Dr. Florene Glen as well as Ms. Autumn Patty they decided to transition patient's care to focus more on comfort although there would still like to be discharged to SNF as opposed to inpatient hospice. Goal of care is to not to return to hospital and continue aggressive comfort measures. Social worker consulted, will follow up on planning.  Cancer related pain  Pain is better controlled now however patient more somnolent and less responsive Will decrease pain medication doses to  MS Contin 15 mg twice daily, OxyContin IR5 mg every 4 hours for breakthrough pain and Tylenol thousand milligrams every 6 hours as needed.   CT head shows no acute abnormality at present. Adding tramadol as scheduled for catheter drainage.  Recurrent malignant pleural effusion on the left Continue to drain 3 times daily - patient refusing drainage at times Continue palliative radiotherapy (cycle, last cycle 03/27/2018.  Acute kidney injury -hyperkalemia Stable, monitor. Renal function worsened and after hydration improved. Currently serum creatinine close to normal BUN is about the same.  But  patient does have persistent hyperkalemia. This appears to be secondary to metabolic acidosis, will start on patiromer.  Leukocytosis - improved   No signs of  sepsis, no source of infection.  No fever Patient was started empirically on IV vancomycin and Zosyn, blood cultures negative so far. MRSA PCR negative.  Continue to monitor off antibiotics. Check CBC in AM   Orthostatic hypotension Improved   Dysphagia Diflucan x 7 days completed Continue Magic mouth wash - refusing at times   DVT prophylaxis: Lovenox, DC due to being on comfort measures. Code Status: DNR Family Communication: None at bedside Disposition Plan: residential hospice.  Depending on decision by family.  Consultants:   Oncology  Radiation oncology  Palliative care  Procedures:   Radiation therapy  Antimicrobials: Anti-infectives (From admission, onward)   Start     Dose/Rate Route Frequency Ordered Stop   03/18/18 1000  fluconazole (DIFLUCAN) tablet 100 mg  Status:  Discontinued     100 mg Oral Daily 03/18/18 0743 03/26/18 1101   03/16/18 1600  fluconazole (DIFLUCAN) IVPB 100 mg  Status:  Discontinued     100 mg 50 mL/hr over 60 Minutes Intravenous Every 24 hours 03/16/18 1431 03/18/18 0743   03/15/18 2200  vancomycin (VANCOCIN) 1,250 mg in sodium chloride 0.9 % 250 mL IVPB  Status:  Discontinued     1,250 mg 166.7 mL/hr over 90 Minutes Intravenous Every 24 hours 03/14/18 1614 03/18/18 1310   03/14/18 2200  vancomycin (VANCOCIN) IVPB 750 mg/150 ml premix     750 mg 150 mL/hr over 60 Minutes Intravenous  Once 03/14/18 1614 03/14/18 2300   03/14/18 2200  piperacillin-tazobactam (ZOSYN) IVPB 3.375 g  Status:  Discontinued     3.375 g 12.5 mL/hr over 240 Minutes Intravenous Every 8 hours 03/14/18 1614 03/20/18 0750   03/14/18 1515  vancomycin (VANCOCIN) IVPB 1000 mg/200 mL premix     1,000 mg 200 mL/hr over 60 Minutes Intravenous  Once 03/14/18 1506 03/14/18 1644   03/14/18 1515  piperacillin-tazobactam (ZOSYN) IVPB 3.375 g     3.375 g 100 mL/hr over 30 Minutes Intravenous  Once 03/14/18 1506 03/14/18 1614        Objective: Vitals:   03/27/18 2048  03/27/18 2115 03/28/18 0415 03/28/18 1254  BP:  120/60 (!) 107/54 105/63  Pulse:  (!) 114 (!) 112 (!) 114  Resp:  16 18 14   Temp:  97.6 F (36.4 C) 97.6 F (36.4 C) (!) 97.3 F (36.3 C)  TempSrc:      SpO2: 94% 100% 97% 98%  Weight:   70 kg (154 lb 4.8 oz)   Height:        Intake/Output Summary (Last 24 hours) at 03/28/2018 1620 Last data filed at 03/28/2018 1512 Gross per 24 hour  Intake 650 ml  Output 1825 ml  Net -1175 ml   Filed Weights   03/26/18 0506 03/27/18 0503 03/28/18 0415  Weight: 71.5 kg (157 lb 10.1 oz) 71 kg (156 lb 8.4 oz) 70 kg (154 lb 4.8 oz)    Examination:    Kristopher: Ill-looking, cachectic Cardiovascular: RRR, S1/S2 +, no rubs, no gallops Respiratory: Decreased breath sounds by bilaterally, no Abdominal: Soft, NT, ND, bowel sounds + Extremities: Trace LE edema, no cyanosis Neuro: Lethargic, follow commands, oriented to person and place   Data Reviewed: I have personally reviewed following labs and imaging studies  CBC: Recent Labs  Lab 03/26/18 0434 03/27/18 0438  WBC 9.1 7.4  NEUTROABS  --  6.9  HGB 11.1* 10.9*  HCT 34.4* 33.6*  MCV 89.6 88.9  PLT 169 539   Basic Metabolic Panel: Recent Labs  Lab 03/22/18 0327 03/23/18 0323 03/25/18 0248 03/26/18 0434 03/26/18 0855 03/26/18 1515 03/26/18 2118 03/27/18 0438  NA 133* 134* 134* 138 137  --   --  140  K 5.0 5.0 6.0* 6.1* 5.8* 5.4* 5.5* 5.4*  CL 103 104 105 107 109  --   --  112*  CO2 19* 20* 18* 18* 19*  --   --  18*  GLUCOSE 177* 182* 213* 191* 201*  --   --  186*  BUN 29* 28* 42* 56* 56*  --   --  56*  CREATININE 0.87 0.67 1.32* 1.37* 1.25*  --   --  0.98  CALCIUM 9.3 9.3 9.1 9.2 8.6*  --   --  9.2  MG 2.1 2.2 2.3 2.4  --   --   --  2.5*   GFR: Estimated Creatinine Clearance: 75.4 mL/min (by C-G formula based on SCr of 0.98 mg/dL). Liver Function Tests: Recent Labs  Lab 03/26/18 2118 03/27/18 0438  AST 27 15  ALT 17 16*  ALKPHOS 65 65  BILITOT 1.2 0.7  PROT 5.0* 5.0*    ALBUMIN 2.0* 1.9*   No results for input(s): LIPASE, AMYLASE in the last 168 hours. Recent Labs  Lab 03/26/18 2118  AMMONIA 25   Coagulation Profile: No results for input(s): INR, PROTIME in the last 168 hours. Cardiac Enzymes: No results for input(s): CKTOTAL, CKMB, CKMBINDEX, TROPONINI in the last 168 hours. BNP (last 3 results) No results for input(s): PROBNP in the last 8760 hours. HbA1C: No results for input(s): HGBA1C in the last 72 hours. CBG: No results for input(s): GLUCAP in the last 168 hours. Lipid Profile: No results for input(s): CHOL, HDL, LDLCALC, TRIG, CHOLHDL, LDLDIRECT in the last 72 hours. Thyroid Function Tests: Recent Labs    03/26/18 2118  TSH 4.493   Anemia Panel: No results for input(s): VITAMINB12, FOLATE, FERRITIN, TIBC, IRON, RETICCTPCT in the last 72 hours. Sepsis Labs: Recent Labs  Lab 03/27/18 1000  LATICACIDVEN 1.6    No results found for this or any previous visit (from the past 240 hour(s)).    Radiology Studies: No results found.    Scheduled Meds: . feeding supplement  1 Container Oral Q24H  . ipratropium-albuterol  3 mL Nebulization TID  . magic mouthwash w/lidocaine  5 mL Oral TID  . mouth rinse  15 mL Mouth Rinse q12n4p  . megestrol  400 mg Oral Daily  . mometasone   Topical Daily  . morphine  15 mg Oral Q12H  . multivitamin with minerals  1 tablet Oral Daily  . polyethylene glycol  17 g Oral Daily  . protein supplement shake  11 oz Oral Q24H  . senna-docusate  1 tablet Oral BID  . traMADol  50 mg Oral Q12H   Continuous Infusions: . dextrose 5 % and 0.45% NaCl 50 mL/hr at 03/28/18 0452     LOS: 21 days    Time spent: Total of 25 minutes  Berle Mull, MD Pager: Text Page via [email or web address]   If 7PM-7AM, please contact night-coverage [email or web address] 03/28/2018, 4:20 PM

## 2018-03-28 NOTE — Progress Notes (Signed)
CSW contacted patient's brother Kristopher Johnson), no answer. CSW left voicemail requesting return phone call.  CSW contacted patient's sister Kristopher Johnson), no answer. CSW left voicemail requesting return phone call.  CSW went to speak with patient's daughter at bedside, no other family present. CSW provided update to patient's daughter about CSW inability to reach other family members and agreed to follow up with patient's HCPOA about making referral to residential hospice. CSW agreed to update patient's daughter after speaking with patient's HCPOA.   CSW contacted patient's HCPOA Kristopher Johnson) and provided update. Patient's HCPOA reported that she is agreeable to residential hospice referral. She reported that she prefers the residential hospice facility that is close to patient's daughter so she can visit patient. CSW agreed to follow up with patient's daughter about proximity from her home to [facility]. Patient's HCPOA reported that she wanted patient's daughter and sister present during the meeting. CSW agreed to make referral and let hospice hospital liaison aware that both parties need to be at meeting. Patient's HCPOA thanked CSW for assistance with process.  CSW spoke with patient's daughter at bedside and provided update. Patient's daughter reported that she stays about 9 minutes from [facility] and is agreeable to referral as well.     CSW contacted [facility] and made referral. Hospital liaison to follow up with patient's daughter and sister to arrange meeting.   CSW will continue to follow to assist with discharge planning.  Abundio Miu, [place] Social Worker [facility] Cell#: [phone]

## 2018-03-28 NOTE — Progress Notes (Signed)
OT Cancellation Note  Patient Details Name: OLVIN ROHR MRN: [id number] DOB: 20-Aug-1956   Cancelled Treatment:    Reason Eval/Treat Not Completed: Other (comment).  Noted pt is transitioning to comfort focus with plan to go to SNF with palliative services. Will sign off from acute setting.  [place] 03/28/2018, 7:41 AM  Lesle Chris, OTR/L [phone] 03/28/2018

## 2018-03-28 NOTE — Progress Notes (Signed)
PT Cancellation Note  Patient Details Name: Kristopher Johnson MRN: [id number] DOB: 1956-04-24   Cancelled Treatment:    Reason Eval/Treat Not Completed: Plan is now for comfort care and residential hospice. Will sign off.

## 2018-03-28 NOTE — Progress Notes (Signed)
[facility])  - RN note  Received request from [employer] for family interest in [facility]. Chart reviewed and spoke with daughter Altha Harm on phone to acknowledge referral, answer questions and explain hospice services. Unfortunately, [facility] is not able to offer a room today. Family and CSW are aware.  [facility] liaison will follow up with CSW and family tomorrow or sooner if room becomes available.   Please do not hesitate to call with questions.  Thank you,  Gar Ponto, [facility] Liaison [place] are found on AMION

## 2018-03-29 DIAGNOSIS — K639 Disease of intestine, unspecified: Secondary | ICD-10-CM

## 2018-03-29 DIAGNOSIS — Z6822 Body mass index (BMI) 22.0-22.9, adult: Secondary | ICD-10-CM

## 2018-03-29 DIAGNOSIS — R627 Adult failure to thrive: Secondary | ICD-10-CM

## 2018-03-29 MED ORDER — MORPHINE SULFATE (PF) 2 MG/ML IV SOLN
0.5000 mg | INTRAVENOUS | Status: DC | PRN
  Administered 2018-03-29 – 2018-03-30 (×4): 1 mg via INTRAVENOUS
  Filled 2018-03-29 (×4): qty 1

## 2018-03-29 MED ORDER — ACETAMINOPHEN 500 MG PO TABS: 500.0000 mg | ORAL_TABLET | Freq: Four times a day (QID) | ORAL | Status: DC | PRN

## 2018-03-29 MED ORDER — MAGIC MOUTHWASH W/LIDOCAINE
5.0000 mL | Freq: Four times a day (QID) | ORAL | Status: DC | PRN
  Filled 2018-03-29: qty 5

## 2018-03-29 NOTE — Progress Notes (Signed)
HEMATOLOGY/ONCOLOGY INPATIENT PROGRESS NOTE  Date of Service: 03/29/2018  Inpatient Attending: .Lavina Hamman, MD   SUBJECTIVE  [name] was seen in oncologic f/u with his daughter and sister at bedside. Resting comfortably in bed. Poor po intake for a few weeks with increasing lethargy and Failure to thrive. Completed palliative RT to pleural space.  Family weighed in to decide on pursing comfort only hospice cares as is appropriate in  His situation. I answered questions the daughter and sister had at bedside regarding hospice. They expressed gratitude for all the cares. Awaiting placement at [facility] place.  OBJECTIVE:  Appears fatigued   PHYSICAL EXAMINATION: . Vitals:   03/29/18 0459 03/29/18 0501 03/29/18 0757 03/29/18 1452  BP:  (!) 107/55    Pulse:  (!) 113    Resp:  18    Temp:  (!) 97.4 F (36.3 C)    TempSrc:      SpO2:  97% 95% 94%  Weight: 150 lb 3.2 oz (68.1 kg)     Height:       Filed Weights   03/27/18 0503 03/28/18 0415 03/29/18 0459  Weight: 156 lb 8.4 oz (71 kg) 154 lb 4.8 oz (70 kg) 150 lb 3.2 oz (68.1 kg)   .Body mass index is 22.18 kg/m.  GENERAL:lethargic, resting in bed, cachetic appearing. SKIN: no acute rashes EYES: resting NECK:  no JVD LUNGS: left pleurx catheter in situ, decreased AE at base HEART: regular rate & rhythm ABDOMEN: abdomen soft, non-tender, normoactive bowel sounds  Musculoskeletal: no cyanosis of digits and no clubbing  PSYCH: alert & oriented x 3 with fluent speech NEURO: no focal motor/sensory deficits  MEDICAL HISTORY:  Past Medical History:  Diagnosis Date  . Abscess of arm, right 10/27/2015  . 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) 10/27/2015  . 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 Shortness of breath 10/27/2015    SURGICAL HISTORY: Past Surgical History:  Procedure Laterality Date  . Cataract surgery  Bilateral    Patient notes she has had bilateral Surgery in 1998 and 99  . FLEXIBLE SIGMOIDOSCOPY N/A 03/01/2017   Procedure: FLEXIBLE SIGMOIDOSCOPY;  Surgeon: Manus Gunning, MD;  Location: [facility];  Service: Gastroenterology;  Laterality: N/A;  . IR GUIDED [place]  02/20/2018  . TONSILLECTOMY     about 62years old    SOCIAL HISTORY: Social History   Socioeconomic History  . Marital status: Widowed    Spouse name: Not on file  . Number of children: Not on file  . Years of education: Not on file  . Highest education level: Not on file  Occupational History  . Not on file  Social Needs  . Financial resource strain: Not on file  . Food insecurity:    Worry: Not on file    Inability: Not on file  . Transportation needs:    Medical: Not on file    Non-medical: Not on file  Tobacco Use  . Smoking status: Current Every Day Smoker    Packs/day: 0.50    Years: 33.00    Pack years: 16.50    Types: Cigarettes  . Smokeless tobacco: Never Used  Substance and Sexual Activity  . Alcohol use: No  . Drug use: No  . Sexual activity: Not on file    Comment: Disabled, retinis pigmentosa legally blind, 4children MPOA Lynnn(cousin)  Lifestyle  .  Physical activity:    Days per week: Not on file    Minutes per session: Not on file  . Stress: Not on file  Relationships  . Social connections:    Talks on phone: Not on file    Gets together: Not on file    Attends religious service: Not on file    Active member of club or organization: Not on file    Attends meetings of clubs or organizations: Not on file    Relationship status: Not on file  . Intimate partner violence:    Fear of current or ex partner: Not on file    Emotionally abused: Not on file    Physically abused: Not on file    Forced sexual activity: Not on file  Other Topics Concern  . Not on file  Social History Narrative  . Not on file    FAMILY HISTORY: Family History  Problem  Relation Age of Onset  . Colon cancer Neg Hx     ALLERGIES:  has No Known Allergies.  MEDICATIONS:  Scheduled Meds: . feeding supplement  1 Container Oral Q24H  . ipratropium-albuterol  3 mL Nebulization TID  . mouth rinse  15 mL Mouth Rinse q12n4p  . megestrol  400 mg Oral Daily  . morphine  15 mg Oral Q12H  . polyethylene glycol  17 g Oral Daily  . protein supplement shake  11 oz Oral Q24H  . senna-docusate  1 tablet Oral BID  . traMADol  50 mg Oral Q12H   Continuous Infusions: . dextrose 5 % and 0.45% NaCl 50 mL/hr at 03/28/18 2228   PRN Meds:.acetaminophen, alum & mag hydroxide-simeth, ipratropium-albuterol, magic mouthwash w/lidocaine, morphine injection, morphine CONCENTRATE, ondansetron **OR** ondansetron (ZOFRAN) IV, sodium chloride flush  REVIEW OF SYSTEMS:    10 Point review of Systems was done is negative except as noted above.   LABORATORY DATA:  I have reviewed the data as listed  . CBC Latest Ref Rng & Units 03/27/2018 03/26/2018 03/21/2018  WBC 4.0 - 10.5 K/uL 7.4 9.1 10.6(H)  Hemoglobin 13.0 - 17.0 g/dL 10.9(L) 11.1(L) 10.8(L)  Hematocrit 39.0 - 52.0 % 33.6(L) 34.4(L) 33.1(L)  Platelets 150 - 400 K/uL 159 169 195    . CMP Latest Ref Rng & Units 03/27/2018 03/26/2018 03/26/2018  Glucose 65 - 99 mg/dL 186(H) - -  BUN 6 - 20 mg/dL 56(H) - -  Creatinine 0.61 - 1.24 mg/dL 0.98 - -  Sodium 135 - 145 mmol/L 140 - -  Potassium 3.5 - 5.1 mmol/L 5.4(H) 5.5(H) 5.4(H)  Chloride 101 - 111 mmol/L 112(H) - -  CO2 22 - 32 mmol/L 18(L) - -  Calcium 8.9 - 10.3 mg/dL 9.2 - -  Total Protein 6.5 - 8.1 g/dL 5.0(L) 5.0(L) -  Total Bilirubin 0.3 - 1.2 mg/dL 0.7 1.2 -  Alkaline Phos 38 - 126 U/L 65 65 -  AST 15 - 41 U/L 15 27 -  ALT 17 - 63 U/L 16(L) 17 -     RADIOGRAPHIC STUDIES: I have personally reviewed the radiological images as listed and agreed with the findings in the report. Dg Chest 2 View  Result Date: 02/11/2018 CLINICAL DATA:  History of lung carcinoma with  shortness of Breath EXAM: CHEST - 2 VIEW COMPARISON:  02/20/2018 PleurX, 02/14/2018 FINDINGS: Cardiac shadow is obscured by a significantly enlarged left-sided pleural effusion. A left PleurX catheter is noted in place. Minimal left lung aeration is noted. Right chest wall port is seen in  satisfactory position. Right lung is clear. No bony abnormality is noted. IMPRESSION: Enlarging left-sided pleural effusion. Electronically Signed   By: Inez Catalina M.D.   On: 02/27/2018 17:00   Ct Head Wo Contrast  Result Date: 03/25/2018 CLINICAL DATA:  Altered level of consciousness. History of mantle cell lymphoma. EXAM: CT HEAD WITHOUT CONTRAST TECHNIQUE: Contiguous axial images were obtained from the base of the skull through the vertex without intravenous contrast. COMPARISON:  None. FINDINGS: Brain: No evidence of acute infarction, hemorrhage, hydrocephalus, extra-axial collection or mass lesion/mass effect. Vascular: No hyperdense vessel or unexpected calcification. Skull: Normal. Negative for fracture or focal lesion. Sinuses/Orbits: No acute finding. Other: None. IMPRESSION: Normal head CT. Electronically Signed   By: Marijo Conception, M.D.   On: 03/25/2018 14:33   Ct Chest Wo Contrast  Result Date: 03/14/2018 CLINICAL DATA:  Respiratory illness. 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 EXAM: CT CHEST WITHOUT CONTRAST TECHNIQUE: Multidetector CT imaging of the chest was performed following the standard protocol without IV contrast. COMPARISON:  Chest CT angiogram dated 03/09/2018. chest CT dated 03/05/2018. Chest CT dated 01/28/2018. FINDINGS: Cardiovascular: Heart size is normal. Heart is displaced to the RIGHT. No pericardial effusion seen. No thoracic aortic aneurysm seen. Mediastinum/Nodes: There is extensive mediastinal lymphadenopathy, not significantly changed compared to previous CTs of 03/09/2018 and 03/05/2018, but significantly  progressed compared to chest CT of 01/28/2018 Lungs/Pleura: Loculated appearing LEFT pleural effusion is decreased in size compared to most recent chest CT of 03/09/2018, chest tube in place with tip directed towards the lateral aspects of the LEFT upper lung. Multifocal masslike nodularity along the pleura throughout the LEFT chest. Most prominent component of this pleural masslike nodularity is again seen at the anterior aspects of the LEFT upper lung, measuring approximately 6.3 cm transverse dimension and 6.2 cm AP dimension (series 2, images 44 through 48), measuring only 2.5 cm on chest CT of 01/28/2018, also slightly increased in size compared to the most recent chest CT of 03/09/2018 where it measured approximately 5.8 x 5.7 cm. The mass effect on the mediastinum, with rightward mediastinal shift, is not significantly changed compared to the most recent chest CT of 03/09/2018. Small peripheral ground-glass opacity within the lateral aspects of the RIGHT upper lobe, pneumonia versus edema. RIGHT lung otherwise clear. No pneumothorax seen. Upper Abdomen: Characterization of the upper abdomen is limited by patient motion artifact. Musculoskeletal: No acute or suspicious osseous finding. Additional prominent lymph nodes in the LEFT axilla, significantly increased in size compared to previous chest CT of 01/28/2018, largest now with short axis measurement of approximately 2.2 cm (previously 1 cm). IMPRESSION: 1. Significant progression of disease compared to chest CT of 01/28/2018, and least mild progression of disease compared to the more recent chest CT of 03/09/2018, as detailed above. 2. LEFT pleural effusion, moderate to large in size, likely multiloculated, with chest tube in place, slightly decreased in size compared to most recent chest CT of 03/09/2018. 3. The mass effect on the mediastinum, with associated rightward mediastinal shift, is not significantly changed compared to the most recent chest CT. 4.  Small peripheral ground-glass opacity within the lateral aspects of the RIGHT upper lobe, edema versus early pneumonia, favor edema or atelectasis. RIGHT lung is otherwise clear. 5. Enlarged lymph nodes in the LEFT axilla, increased in size compared to previous exams, again compatible with progression of disease. Electronically Signed   By: Roxy Horseman.D.  On: 03/14/2018 14:02   Ct Chest Wo Contrast  Result Date: 03/05/2018 CLINICAL DATA:  Shortness of breath, cough. EXAM: CT CHEST WITHOUT CONTRAST TECHNIQUE: Multidetector CT imaging of the chest was performed following the standard protocol without IV contrast. COMPARISON:  PET-CT 02/05/2018 and previous FINDINGS: Cardiovascular: Right IJ port catheter to the distal SVC. Heart size normal. No pericardial effusion. Mediastinum/Nodes: Left supraclavicular, anterior mediastinal, right paratracheal, and subcarinal adenopathy as before. There is mild rightward mediastinal shift secondary to left pleural disease. Anterior left pleural mass is probably associated with mediastinal invasion. Lungs/Pleura: Progressive, near complete opacification of the left lung. Large left pleural effusion with good position of tunneled drain catheter. Extensive pleural thickening and masses. Right lung clear. Upper Abdomen: Stable epigastric adenopathy. Stable probable cyst in left hepatic lobe. No acute findings. Musculoskeletal: Spondylitic changes in the visualized lower cervical spine. No fracture or worrisome bone lesion. IMPRESSION: 1. Progressive opacification of left hemithorax secondary to nodular pleural disease and large effusion. 2. The tunneled left pleural drain catheter appears appropriately positioned, suggesting the patient may benefit from more aggressive thoracentesis. 3. Persistent left supraclavicular, mediastinal, and epigastric adenopathy. Electronically Signed   By: Lucrezia Europe M.D.   On: 03/05/2018 14:58   Ct Angio Chest Pe W Or Wo Contrast  Result  Date: 03/09/2018 CLINICAL DATA:  62 year old male with stage IV lymphoma. Recurrent malignant effusion with catheter placed Feb 20, 2018. Dizziness and shortness of breath. EXAM: CT ANGIOGRAPHY CHEST WITH CONTRAST TECHNIQUE: Multidetector CT imaging of the chest was performed using the standard protocol during bolus administration of intravenous contrast. Multiplanar CT image reconstructions and MIPs were obtained to evaluate the vascular anatomy. CONTRAST:  186mL ISOVUE-370 IOPAMIDOL (ISOVUE-370) INJECTION 76% COMPARISON:  Chest CT Mar 05, 2018 FINDINGS: Cardiovascular: The heart mediastinum are shifted to the right due to a large left pleural effusion which will be described below. The thoracic aorta is normal in caliber with no atherosclerosis, aneurysm, or dissection. The heart size is normal and stable. No obvious coronary artery calcifications. Evaluation of pulmonary arterial branches on the right is limited due to significant atelectatic lung due to the large right pleural effusion. No pulmonary emboli identified. Mediastinum/Nodes: There is a large left pleural effusion filling nearly the entire left hemithorax resulting in significant atelectatic/collapsed lung on the left. Only minimal aerated lung remains on the left. The effusion results in shift of the heart mediastinum to the right. There is diffuse thickening of the right-sided pleura with pleural based masses remaining. The largest anterior pleural base mass on series 4, image 27 extends in the anterior chest wall, stable. No right-sided pleural effusion. No pericardial effusion. No right axillary adenopathy. Mildly enlarged nodes are seen in the base of the neck, particularly on the left. A 14 mm node is seen on image 5 in the base of the neck on the left. Left axillary and retropectoral adenopathy remains, similar in the interval. There is skin thickening and edema associated with the left chest wall including the breast. This does not appear to be  confined to the breast. The chest wall is otherwise unremarkable. Adenopathy remains in the mediastinum. The esophagus and thyroid are unremarkable. Lungs/Pleura: There is only minimal aerated lung on the left. The right lung is free of infiltrate, nodule, or mass. No pneumothorax. The central airways are unremarkable. Upper Abdomen: Mild adenopathy remains in the upper abdomen. Musculoskeletal: No chest wall abnormality. No acute or significant osseous findings. Review of the MIP images confirms the above findings.  IMPRESSION: 1. No pulmonary emboli identified. Evaluation of left-sided pulmonary arterial branches is limited due to near complete collapse of the left lung secondary to a large left pleural effusion. 2. There is a large left pleural effusion filling the left hemithorax resulting in significant collapse of the left lung. The pleura is thickened and there are multiple pleural-based masses, unchanged. The large left effusion shifts the heart and mediastinum to the right. 3. Adenopathy in the left axilla, base of neck, left greater than right, mediastinum, and upper abdomen consistent with the patient's known lymphoma. Electronically Signed   By: Dorise Bullion III M.D   On: 03/09/2018 08:50   Dg Chest Left Decubitus  Result Date: 02/11/2018 CLINICAL DATA:  History left pleural effusion and prior tunneled PleurX catheter placement. Possible enlarging loculated pleural effusion by chest x-ray. EXAM: CHEST - LEFT DECUBITUS COMPARISON:  Chest x-ray earlier today. FINDINGS: It is difficult to determine by a decubitus chest x-ray whether there is a significant component of pleural fluid. It does not appear that there is a large amount layering fluid, but there may be some layering component. Underlying atelectasis and/or consolidation of the lower lung is suspected. IMPRESSION: It is difficult to tell by decubitus chest x-ray whether there is a significant component of pleural fluid present. A CT of the  chest would be needed to determine whether fluid is present and if so, its relationship to the existing tunneled PleurX drainage catheter. Electronically Signed   By: Aletta Edouard M.D.   On: 02/20/2018 20:41   Dg Chest Port 1 View  Result Date: 03/18/2018 CLINICAL DATA:  Left chest tube EXAM: PORTABLE CHEST 1 VIEW COMPARISON:  CT 03/14/2018 FINDINGS: Left PleurX catheter in place. Loculated left pleural effusion again noted, unchanged. Diffuse left lung airspace opacity, likely compressive atelectasis. Right lung clear. Right Port-A-Cath is unchanged. Heart is normal size. IMPRESSION: Continued loculated left pleural effusion with left PleurX catheter in place. Opacities in the left lung likely reflect atelectasis. No real change since prior CT. Electronically Signed   By: Rolm Baptise M.D.   On: 03/18/2018 11:27    ASSESSMENT & PLAN:  62 y.o. 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 ECHO nl EF ECOG PS 2 but activities significantly limited due to being legally blind HIV/Hep C/Hep B neg  PET/CT scan after 3 cycles of R CHOP show good overall response. Stomach lesion still with very FDG uptake ? Residual lymphoma versus inflammation.  Patient is status post 6 cycles of R CHOP. PET/CT after 6 cycles shows resolution of hypermetabolic lymphadenopathy and gastric wall activity . Patient was on maintenance Rituxan for 1 year now prior to relapse. -Labs reviewedtoday (02/21/18) of CBC, CMP, Reticulocytes, Uric acid, phosphorus, and LDHis as follows: all values are WNL except for CBC with WBC at 11.5. CMP with total protein at 5.6, albumin at 2.8. LDH decreased to 472.  -I discussed that we will monitor his counts and give IVF weekly as well as add on rituaxin.  -F/u as scheduled.  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 CT chest abdomen pelvis showed multiple areas of lymphadenopathy in the abdomen as well as in the chest suggesting significant recurrence. PET/CT scan 03/25/2017 shows diffuse involvement with mantle cell lymphoma involving the  chest abdomen pelvis and bowels. 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 Workup showed malignant pleural effusion and b/l pleural involvement and rectal urgency due to mantle cell lymphoma causing mass in rectum/sigmoid -PET/CT 10/25/2017 - with significant mantle cell lymphoma progression again. -He was placed onIbrutinib 560 mg p.o. Daily in 10/2017 and has resolution of most of his symptoms and normalization of elevated LDH levels.  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 PET/CT 02/05/2018 with mixed treatment response. S/p pleurx catheter placement. Was on Ibrutinib + added Rituxan and Venetoclax but could not tolerate Venetoclax at 100mg  po daily Venetoclax and Ibrutinib held Admitted with uncontrolled left pleural effusion and sepsis/SIRS (procalcitonin 20) Started on palliative RT to left lung/pleural disease from 03/13/2018  5) Rapidly developing splenomegaly related to relapsed MCL  6) AKI with hyperkalemia  7) New progressive neutrophilia concern for sepsis vs SIRS from tumor lysis. Concern for sepsis given procalctonin 20 -- s/p broad spectrum antibiotics PLAN:  - patient continues to get more leathargic with failure to thrive and very poor po intake and significant weight loss. -goals of care have transitioned to comfort cares and enrolment in  hospice. -comfortable at this time. -pleurex catheter drainage for comfort -would hold further labs or IV sticks. -answered daughters and patients sister questions about hospice and his condition. THey are comfortable with the decision to proceed to hospice.  Please call if any questions arise.  . The total time spent in the appointment was 25 minutes and more than 50% was on counseling and direct patient cares.   [facility] AAHIVMS [facility] Hematology/Oncology Physician [facility]  (Office):       [phone] (Work cell):  [phone] (Fax):           [phone]  03/29/2018 4:19 PM

## 2018-03-29 NOTE — Progress Notes (Signed)
[facility] -- Hospice and [facility]) RN Note @ 1020 am  Spoke with Servando Snare, LCSW regarding disposition for Kristopher Johnson, clarifying that family does wish to proceed with [facility] vs. SNF.  Kristopher Johnson and daughter Kristopher Johnson confirm that plan is for [facility] for EOL comfort care.  Unfortunately, [facility] is not able to offer a room today. Kristopher Johnson, daughter and Quincy Sheehan CSW are aware.  [facility] liaison will follow up with CSW and family tomorrow or sooner if room becomes available.   Please call with any hospice related questions or concerns.  Thank you, Margaretmary Eddy, RN, [facility] Liaison [phone]  [place] are on AMION.

## 2018-03-29 NOTE — Progress Notes (Signed)
LCSW confirmed plan for [facility] with MD and patient's daughter Altha Harm.   LCSW updated Olivia Mackie at [facility].   LCSW will assist with dc needs when bed is available.   Carolin Coy [place]

## 2018-03-29 NOTE — Progress Notes (Signed)
PROGRESS NOTE [facility]   [name]   ONG:[id number] DOB: 1956/09/15  DOA: 02/17/2018 PCP: Patient, No Pcp Per   Brief Narrative:  Kristopher Johnson is an 62 y.o. 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  Radiation oncology was consulted and recommended 2 weeks of elective radiation.  We consulted hospice and palliative care and patient and would like to continue aggressive treatment.  The patient subsequently developed a sepsis-like picture, he was started empirically on IV vancomycin and cefepime and his picture seems to be improving, his remained afebrile, his leukocytosis is improving. He is also having his Pleurx catheter drain TID. He was tried on oral hydration and he was not able to keep up with the losses, therefore he was started on IV fluids, now his acute renal failure has improved his hypotension and tachycardia have also improved.  Subjective: No acute events.  Patient more bedridden and in more distress.  Continues to refuse medications.  Assessment & Plan: Mantle cell lymphoma of lymph nodes of multiple regions Patient on ibrutinib, rituxan q 3 weeks. Holding Venetoclax.  Oncology not recommending any systemic chemotherapy as patient is too frail for treatment.  Offered palliative radiation for 2 weeks in hopes of reducing pleural effusion and improve respiratory status, however no much improvement has been noted.  Patient is continued to decline.  I have discussed with Dr Kathreen Devoid, who is patient's niece and a podiatrist, who called on behalf of POA.   explained prognosis, condition and that this is not a curable disease, which seems to be getting worse day by day.  I also discussed the patient's power of attorney Ms. Autumn Patty. Patient has terminal illness with third recurrence of his lymphoma for which he is not strong enough to tolerate any chemotherapy and appears to have progressive symptoms from his lymphoma. Patient is having difficulty participating in his care and so that is refusing many of the medications, therapy, occasionally radiation requiring extensive persuasion. Patient has minimal p.o. intake, metabolic acidosis likely from starvation due to anorexia from cancer. Patient did not want any feeding tube, after providing above information to Dr. Florene Glen as well as Ms. Autumn Patty they decided to transition patient's care to focus more on comfort. Goal of care is to not to return to hospital and continue aggressive comfort measures. Social worker consulted, will follow up on planning. Awaiting placement at [facility].  Cancer related pain  Continue pain control with scheduled morphine, PRN Roxanol, scheduled tramadol as well.  Recurrent malignant pleural effusion on the left Continue to drain 3 times daily - patient refusing drainage Occasionally at times Completed palliative radiotherapy cycle, last session 03/27/2018.  Acute kidney injury -hyperkalemia Stable, monitor. Renal function worsened.  Was started on patiromer although patient refused all his oral medication.  Currently discontinued.  Leukocytosis - improved   No signs of sepsis, no source of infection.  No fever Patient was started empirically on IV vancomycin and Zosyn, blood cultures negative so far. MRSA PCR negative.  Continue to monitor off antibiotics. Check CBC in AM   Orthostatic hypotension Improved   Dysphagia Diflucan x 7 days completed Continue Magic  mouth wash - refusing at times   DVT prophylaxis: none due to being on comfort measures. And refusal by  pt Code Status: DNR Family Communication: None at bedside Disposition Plan: residential hospice.  Depending on decision by family.  Consultants:   Oncology  Radiation oncology  Palliative care  Procedures:   Radiation therapy  Antimicrobials: Anti-infectives (From admission, onward)   Start     Dose/Rate Route Frequency Ordered Stop   03/18/18 1000  fluconazole (DIFLUCAN) tablet 100 mg  Status:  Discontinued     100 mg Oral Daily 03/18/18 0743 03/26/18 1101   03/16/18 1600  fluconazole (DIFLUCAN) IVPB 100 mg  Status:  Discontinued     100 mg 50 mL/hr over 60 Minutes Intravenous Every 24 hours 03/16/18 1431 03/18/18 0743   03/15/18 2200  vancomycin (VANCOCIN) 1,250 mg in sodium chloride 0.9 % 250 mL IVPB  Status:  Discontinued     1,250 mg 166.7 mL/hr over 90 Minutes Intravenous Every 24 hours 03/14/18 1614 03/18/18 1310   03/14/18 2200  vancomycin (VANCOCIN) IVPB 750 mg/150 ml premix     750 mg 150 mL/hr over 60 Minutes Intravenous  Once 03/14/18 1614 03/14/18 2300   03/14/18 2200  piperacillin-tazobactam (ZOSYN) IVPB 3.375 g  Status:  Discontinued     3.375 g 12.5 mL/hr over 240 Minutes Intravenous Every 8 hours 03/14/18 1614 03/20/18 0750   03/14/18 1515  vancomycin (VANCOCIN) IVPB 1000 mg/200 mL premix     1,000 mg 200 mL/hr over 60 Minutes Intravenous  Once 03/14/18 1506 03/14/18 1644   03/14/18 1515  piperacillin-tazobactam (ZOSYN) IVPB 3.375 g     3.375 g 100 mL/hr over 30 Minutes Intravenous  Once 03/14/18 1506 03/14/18 1614        Objective: Vitals:   03/29/18 0454 03/29/18 0459 03/29/18 0501 03/29/18 0757  BP:   (!) 107/55   Pulse:   (!) 113   Resp:   18   Temp: (!) 97.4 F (36.3 C)  (!) 97.4 F (36.3 C)   TempSrc: Axillary     SpO2:   97% 95%  Weight:  68.1 kg (150 lb 3.2 oz)    Height:        Intake/Output Summary (Last 24 hours) at 03/29/2018 1422 Last data filed at 03/29/2018 1000 Gross per 24 hour  Intake 1456.67 ml  Output 2250 ml  Net  -793.33 ml   Filed Weights   03/27/18 0503 03/28/18 0415 03/29/18 0459  Weight: 71 kg (156 lb 8.4 oz) 70 kg (154 lb 4.8 oz) 68.1 kg (150 lb 3.2 oz)    Examination:    General: Ill-looking, cachectic Cardiovascular: RRR, S1/S2 +, no rubs, no gallops Respiratory: Decreased breath sounds by bilaterally, no Abdominal: Soft, NT, ND, bowel sounds + Extremities: Trace LE edema, no cyanosis Neuro: Lethargic, follow commands, oriented to person and place   Data Reviewed: I have personally reviewed following labs and imaging studies  CBC: Recent Labs  Lab 03/26/18 0434 03/27/18 0438  WBC 9.1 7.4  NEUTROABS  --  6.9  HGB 11.1* 10.9*  HCT 34.4* 33.6*  MCV 89.6 88.9  PLT 169 001   Basic Metabolic Panel: Recent Labs  Lab 03/23/18 0323 03/25/18 0248 03/26/18 0434 03/26/18 0855 03/26/18 1515 03/26/18 2118 03/27/18 0438  NA 134* 134* 138 137  --   --  140  K 5.0 6.0* 6.1* 5.8* 5.4* 5.5* 5.4*  CL 104 105 107 109  --   --  112*  CO2 20*  18* 18* 19*  --   --  18*  GLUCOSE 182* 213* 191* 201*  --   --  186*  BUN 28* 42* 56* 56*  --   --  56*  CREATININE 0.67 1.32* 1.37* 1.25*  --   --  0.98  CALCIUM 9.3 9.1 9.2 8.6*  --   --  9.2  MG 2.2 2.3 2.4  --   --   --  2.5*   GFR: Estimated Creatinine Clearance: 73.4 mL/min (by C-G formula based on SCr of 0.98 mg/dL). Liver Function Tests: Recent Labs  Lab 03/26/18 2118 03/27/18 0438  AST 27 15  ALT 17 16*  ALKPHOS 65 65  BILITOT 1.2 0.7  PROT 5.0* 5.0*  ALBUMIN 2.0* 1.9*   No results for input(s): LIPASE, AMYLASE in the last 168 hours. Recent Labs  Lab 03/26/18 2118  AMMONIA 25   Coagulation Profile: No results for input(s): INR, PROTIME in the last 168 hours. Cardiac Enzymes: No results for input(s): CKTOTAL, CKMB, CKMBINDEX, TROPONINI in the last 168 hours. BNP (last 3 results) No results for input(s): PROBNP in the last 8760 hours. HbA1C: No results for input(s): HGBA1C in the last 72 hours. CBG: No results for  input(s): GLUCAP in the last 168 hours. Lipid Profile: No results for input(s): CHOL, HDL, LDLCALC, TRIG, CHOLHDL, LDLDIRECT in the last 72 hours. Thyroid Function Tests: Recent Labs    03/26/18 2118  TSH 4.493   Anemia Panel: No results for input(s): VITAMINB12, FOLATE, FERRITIN, TIBC, IRON, RETICCTPCT in the last 72 hours. Sepsis Labs: Recent Labs  Lab 03/27/18 1000  LATICACIDVEN 1.6    No results found for this or any previous visit (from the past 240 hour(s)).    Radiology Studies: No results found.    Scheduled Meds: . feeding supplement  1 Container Oral Q24H  . ipratropium-albuterol  3 mL Nebulization TID  . mouth rinse  15 mL Mouth Rinse q12n4p  . megestrol  400 mg Oral Daily  . morphine  15 mg Oral Q12H  . polyethylene glycol  17 g Oral Daily  . protein supplement shake  11 oz Oral Q24H  . senna-docusate  1 tablet Oral BID  . traMADol  50 mg Oral Q12H   Continuous Infusions: . dextrose 5 % and 0.45% NaCl 50 mL/hr at 03/28/18 2228     LOS: 22 days    Time spent: Total of 25 minutes  Berle Mull, MD Pager: Text Page via [email or web address]   If 7PM-7AM, please contact night-coverage [email or web address] 03/29/2018, 2:22 PM

## 2018-03-30 MED ORDER — ONDANSETRON 4 MG PO TBDP: 4.0000 mg | ORAL_TABLET | Freq: Four times a day (QID) | ORAL | Status: DC | PRN

## 2018-03-30 MED ORDER — HALOPERIDOL LACTATE 2 MG/ML PO CONC
0.5000 mg | ORAL | Status: DC | PRN
Start: 2018-03-30 — End: 2018-03-30

## 2018-03-30 MED ORDER — GLYCOPYRROLATE 0.2 MG/ML IJ SOLN
0.2000 mg | Freq: Four times a day (QID) | INTRAMUSCULAR | Status: DC
  Administered 2018-03-30: 0.2 mg via INTRAVENOUS
  Filled 2018-03-30 (×2): qty 1

## 2018-03-30 MED ORDER — LORAZEPAM 2 MG/ML IJ SOLN: 1.0000 mg | INTRAMUSCULAR | Status: DC | PRN

## 2018-03-30 MED ORDER — HALOPERIDOL 0.5 MG PO TABS: 0.5000 mg | ORAL_TABLET | ORAL | Status: DC | PRN

## 2018-03-30 MED ORDER — MORPHINE SULFATE (PF) 2 MG/ML IV SOLN: 2.0000 mg | INTRAVENOUS | Status: DC | PRN

## 2018-03-30 MED ORDER — MORPHINE SULFATE ER 15 MG PO TBCR: 15.0000 mg | EXTENDED_RELEASE_TABLET | Freq: Two times a day (BID) | ORAL | 0 refills | Status: AC

## 2018-03-30 MED ORDER — MORPHINE 100MG IN NS 100ML (1MG/ML) PREMIX INFUSION
1.0000 mg/h | INTRAVENOUS | Status: DC
  Administered 2018-03-30: 1 mg/h via INTRAVENOUS
  Filled 2018-03-30: qty 100

## 2018-03-30 MED ORDER — MORPHINE BOLUS VIA INFUSION
1.0000 mg | INTRAVENOUS | Status: DC | PRN
  Filled 2018-03-30: qty 1

## 2018-03-30 MED ORDER — HALOPERIDOL LACTATE 5 MG/ML IJ SOLN: 0.5000 mg | INTRAMUSCULAR | Status: DC | PRN

## 2018-03-30 MED ORDER — ONDANSETRON HCL 4 MG/2ML IJ SOLN: 4.0000 mg | Freq: Four times a day (QID) | INTRAMUSCULAR | Status: DC | PRN

## 2018-03-30 MED ORDER — ACETAMINOPHEN 325 MG PO TABS: 650.0000 mg | ORAL_TABLET | Freq: Four times a day (QID) | ORAL | Status: DC | PRN

## 2018-03-30 MED ORDER — LORAZEPAM 2 MG/ML PO CONC
1.0000 mg | ORAL | Status: DC | PRN
  Filled 2018-03-30: qty 0.5

## 2018-03-30 MED ORDER — IPRATROPIUM-ALBUTEROL 0.5-2.5 (3) MG/3ML IN SOLN: 3.0000 mL | RESPIRATORY_TRACT | Status: DC | PRN

## 2018-03-30 MED ORDER — HALOPERIDOL LACTATE 2 MG/ML PO CONC
0.5000 mg | ORAL | Status: DC | PRN
  Filled 2018-03-30: qty 0.3

## 2018-03-30 MED ORDER — MEGESTROL ACETATE 400 MG/10ML PO SUSP: 400.0000 mg | Freq: Every day | ORAL | 0 refills | Status: AC

## 2018-03-30 MED ORDER — LORAZEPAM 1 MG PO TABS: 1.0000 mg | ORAL_TABLET | ORAL | Status: DC | PRN

## 2018-03-30 MED ORDER — GLYCOPYRROLATE 0.2 MG/ML IJ SOLN
0.1000 mg | Freq: Once | INTRAMUSCULAR | Status: AC
  Administered 2018-03-30: 0.1 mg via INTRAVENOUS
  Filled 2018-03-30: qty 0.5

## 2018-03-30 MED ORDER — ACETAMINOPHEN 650 MG RE SUPP: 650.0000 mg | Freq: Four times a day (QID) | RECTAL | Status: DC | PRN

## 2018-03-30 MED ORDER — MORPHINE SULFATE (CONCENTRATE) 10 MG/0.5ML PO SOLN: 5.0000 mg | ORAL | Status: AC | PRN

## 2018-03-30 MED ORDER — HALOPERIDOL 0.5 MG PO TABS
0.5000 mg | ORAL_TABLET | ORAL | Status: DC | PRN
  Filled 2018-03-30: qty 1

## 2018-03-30 MED ORDER — HALOPERIDOL LACTATE 5 MG/ML IJ SOLN: 2.0000 mg | INTRAMUSCULAR | Status: DC | PRN

## 2018-03-30 MED ORDER — MORPHINE SULFATE (PF) 2 MG/ML IV SOLN
2.0000 mg | Freq: Once | INTRAVENOUS | Status: AC
  Administered 2018-03-30: 2 mg via INTRAVENOUS
  Filled 2018-03-30: qty 1

## 2018-04-02 NOTE — Progress Notes (Signed)
  Radiation Oncology         [phone] ________________________________  Name: Kristopher Johnson MRN: [id number]  Date: 03/27/2018  DOB: 12/02/1955  End of Treatment Note  Diagnosis:   62 y.o. 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    Plan: The patient has completed radiation treatment. The patient will return to radiation oncology clinic as needed. I advised them to call or return if they have any questions or concerns related to their recovery or treatment.  ------------------------------------------------  Jodelle Gross, MD, PhD  This document serves as a record of services personally performed by Kyung Rudd, MD. It was created on his behalf by Rae Lips, a trained medical scribe. The creation of this record is based on the scribe's personal observations and the provider's statements to them. This document has been checked and approved by the attending provider.

## 2018-04-07 NOTE — Discharge Summary (Signed)
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          Diet recommendation: regular diet  Activity: The patient is advised to gradually reintroduce usual activities.  Discharge Condition: stable  Code Status: DNR DNI  History of present illness: As per the H and P dictated on admission, "Kristopher Johnson is a 62 y.o. 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  Brought to the ED via EMS with complaints of dizziness on standing of  ~ 4 days duration, such that patient was not able to get out of bed today.  Shortness of breath with exertion- more chronic, over the past month, with associated orthopnea.  Patient denies fever cough or chest pains, no leg swelling. 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  Patient is on venetoclax for his lymphoma.  Initial dose was 20 mg, with gradual increase in dose every  week.  Yesterday patient took 100 mg.  Patient states since he started taking the medication he has gotten progressively worse, and he feels his current symptoms especially dizziness is related to increased dose of venetoclax. Patient reports nausea poor p.o. intake since starting venetoclax."  Hospital Course:  Summary of his active problems in the hospital is as following. 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 Holding Venetoclax.  Oncology not recommending any systemic chemotherapy as patient is too frail for treatment.  Offered palliative radiation for 2 weeks in hopes of reducing pleural effusion and improve respiratory status, however no much improvement has been noted.  Patient is continued to decline.  I have discussed with Dr Kathreen Devoid, who is patient's niece and a podiatrist, who called on behalf of POA.  explained prognosis, condition and that this is not a curable disease, which seems to be getting worse day by day.  I also discussed the patient's power of attorney Ms. Autumn Patty. Patient has terminal illness with third recurrence of his lymphoma for which he is not strong enough to tolerate any chemotherapy and appears to have progressive symptoms from his lymphoma. Patient is having difficulty participating in his care and so that is refusing many of the medications, therapy, occasionally radiation requiring extensive persuasion. Patient has minimal p.o. intake, metabolic acidosis likely from starvation due to anorexia from cancer. Patient did not want any feeding tube, after providing above information to Dr. Florene Glen as well as Ms. Autumn Patty  they decided to transition patient's care to focus more on complete comfort with hospice. Goal of care is to not to return to hospital and continue aggressive comfort measures. Social worker consulted, pt has placement at [facility].  Cancer related pain  Continue pain control with  scheduled morphine, PRN Roxanol, scheduled tramadol as well.  Recurrent malignant pleural effusion on the left Continue to drain 3 times daily - patient refusing drainage Occasionally at times Completed palliative radiotherapy cycle, last session 03/27/2018.  Acute kidney injury -hyperkalemia Stable, monitor. Renal function worsened.  Was started on patiromer although patient refused all his oral medication.   Currently discontinued and comfort care  Leukocytosis - improved   No signs of sepsis, no source of infection.  No fever Patient was started empirically on IV vancomycin and Zosyn, blood cultures negative so far. MRSA PCR negative.   Orthostatic hypotension Improved   Dysphagia Diflucan x 7 days completed  On the day of the discharge the patient's symptoms were well controlled, and no other acute medical condition were reported by patient. the patient was felt safe to be discharge at [facility] with hospice.  Consultants: oncology  Radiation oncology  Palliative care  Procedures: radiation therapy   DISCHARGE MEDICATION: Allergies as of 04/26/2018   No Known Allergies     Medication List    STOP taking these medications   allopurinol 300 MG tablet Commonly known as:  ZYLOPRIM   dexamethasone 4 MG tablet Commonly known as:  DECADRON   docusate sodium 100 MG capsule Commonly known as:  COLACE   Ibrutinib 560 MG Tabs   pantoprazole 40 MG tablet Commonly known as:  PROTONIX   senna-docusate 8.6-50 MG tablet Commonly known as:  SENNA S   traMADol 50 MG tablet Commonly known as:  ULTRAM   Venetoclax 10 & 50 & 100 MG Tbpk     TAKE these medications   megestrol 400 MG/10ML suspension Commonly known as:  MEGACE Take 10 mLs (400 mg total) by mouth daily. Start taking on:  03/31/2018   morphine 15 MG 12 hr tablet Commonly known as:  MS CONTIN Take 1 tablet (15 mg total) by mouth every 12 (twelve) hours.   morphine CONCENTRATE 10 MG/0.5ML Soln  concentrated solution Place 0.25-0.5 mLs (5-10 mg total) under the tongue every 2 (two) hours as needed for severe pain, anxiety or shortness of breath.   prochlorperazine 5 MG tablet Commonly known as:  COMPAZINE Take 2 tablets (10 mg total) by mouth every 6 (six) hours as needed for nausea or vomiting.      No Known Allergies Discharge Instructions    Diet general   Complete by:  As directed    Increase activity slowly   Complete by:  As directed      Discharge Exam: Filed Weights   03/27/18 0503 03/28/18 0415 03/29/18 0459  Weight: 71 kg (156 lb 8.4 oz) 70 kg (154 lb 4.8 oz) 68.1 kg (150 lb 3.2 oz)   Vitals:   03/29/18 2111 2018/04/26 0524  BP: 110/65 (!) 120/98  Pulse: (!) 137 (!) 107  Resp: (!) 24 18  Temp: 98.9 F (37.2 C) 98.2 F (36.8 C)  SpO2:  (!) 83%   General: Appear in moderate distress, no Rash; Oral Mucosa moist. Cardiovascular: S1 and S2 Present, no Murmur, difficult to assess  JVD Respiratory: Bilateral Air entry present and bilateral  Crackles, no wheezes Abdomen: Bowel Sound present, Extremities: bilateral  Pedal edema,  Neurology: Grossly no focal  neuro deficit. Lethargic and confused.   The results of significant diagnostics from this hospitalization (including imaging, microbiology, ancillary and laboratory) are listed below for reference.    Significant Diagnostic Studies: Dg Chest 2 View  Result Date: 02/27/2018 CLINICAL DATA:  History of lung carcinoma with shortness of Breath EXAM: CHEST - 2 VIEW COMPARISON:  02/20/2018 PleurX, 02/14/2018 FINDINGS: Cardiac shadow is obscured by a significantly enlarged left-sided pleural effusion. A left PleurX catheter is noted in place. Minimal left lung aeration is noted. Right chest wall port is seen in satisfactory position. Right lung is clear. No bony abnormality is noted. IMPRESSION: Enlarging left-sided pleural effusion. Electronically Signed   By: Inez Catalina M.D.   On: 02/27/2018 17:00   Ct Head Wo  Contrast  Result Date: 03/25/2018 CLINICAL DATA:  Altered level of consciousness. History of mantle cell lymphoma. EXAM: CT HEAD WITHOUT CONTRAST TECHNIQUE: Contiguous axial images were obtained from the base of the skull through the vertex without intravenous contrast. COMPARISON:  None. FINDINGS: Brain: No evidence of acute infarction, hemorrhage, hydrocephalus, extra-axial collection or mass lesion/mass effect. Vascular: No hyperdense vessel or unexpected calcification. Skull: Normal. Negative for fracture or focal lesion. Sinuses/Orbits: No acute finding. Other: None. IMPRESSION: Normal head CT. Electronically Signed   By: Marijo Conception, M.D.   On: 03/25/2018 14:33   Ct Chest Wo Contrast  Result Date: 03/14/2018 CLINICAL DATA:  Respiratory illness. 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 EXAM: CT CHEST WITHOUT CONTRAST TECHNIQUE: Multidetector CT imaging of the chest was performed following the standard protocol without IV contrast. COMPARISON:  Chest CT angiogram dated 03/09/2018. chest CT dated 03/05/2018. Chest CT dated 01/28/2018. FINDINGS: Cardiovascular: Heart size is normal. Heart is displaced to the RIGHT. No pericardial effusion seen. No thoracic aortic aneurysm seen. Mediastinum/Nodes: There is extensive mediastinal lymphadenopathy, not significantly changed compared to previous CTs of 03/09/2018 and 03/05/2018, but significantly progressed compared to chest CT of 01/28/2018 Lungs/Pleura: Loculated appearing LEFT pleural effusion is decreased in size compared to most recent chest CT of 03/09/2018, chest tube in place with tip directed towards the lateral aspects of the LEFT upper lung. Multifocal masslike nodularity along the pleura throughout the LEFT chest. Most prominent component of this pleural masslike nodularity is again seen at the anterior aspects of the LEFT upper lung, measuring approximately 6.3 cm transverse  dimension and 6.2 cm AP dimension (series 2, images 44 through 48), measuring only 2.5 cm on chest CT of 01/28/2018, also slightly increased in size compared to the most recent chest CT of 03/09/2018 where it measured approximately 5.8 x 5.7 cm. The mass effect on the mediastinum, with rightward mediastinal shift, is not significantly changed compared to the most recent chest CT of 03/09/2018. Small peripheral ground-glass opacity within the lateral aspects of the RIGHT upper lobe, pneumonia versus edema. RIGHT lung otherwise clear. No pneumothorax seen. Upper Abdomen: Characterization of the upper abdomen is limited by patient motion artifact. Musculoskeletal: No acute or suspicious osseous finding. Additional prominent lymph nodes in the LEFT axilla, significantly increased in size compared to previous chest CT of 01/28/2018, largest now with short axis measurement of approximately 2.2 cm (previously 1 cm). IMPRESSION: 1. Significant progression of disease compared to chest CT of 01/28/2018, and least mild progression of disease compared to the more recent chest CT of 03/09/2018, as detailed above. 2. LEFT pleural effusion, moderate to large in size, likely multiloculated, with chest tube in  place, slightly decreased in size compared to most recent chest CT of 03/09/2018. 3. The mass effect on the mediastinum, with associated rightward mediastinal shift, is not significantly changed compared to the most recent chest CT. 4. Small peripheral ground-glass opacity within the lateral aspects of the RIGHT upper lobe, edema versus early pneumonia, favor edema or atelectasis. RIGHT lung is otherwise clear. 5. Enlarged lymph nodes in the LEFT axilla, increased in size compared to previous exams, again compatible with progression of disease. Electronically Signed   By: Franki Cabot M.D.   On: 03/14/2018 14:02   Ct Chest Wo Contrast  Result Date: 03/05/2018 CLINICAL DATA:  Shortness of breath, cough. EXAM: CT CHEST  WITHOUT CONTRAST TECHNIQUE: Multidetector CT imaging of the chest was performed following the standard protocol without IV contrast. COMPARISON:  PET-CT 02/05/2018 and previous FINDINGS: Cardiovascular: Right IJ port catheter to the distal SVC. Heart size normal. No pericardial effusion. Mediastinum/Nodes: Left supraclavicular, anterior mediastinal, right paratracheal, and subcarinal adenopathy as before. There is mild rightward mediastinal shift secondary to left pleural disease. Anterior left pleural mass is probably associated with mediastinal invasion. Lungs/Pleura: Progressive, near complete opacification of the left lung. Large left pleural effusion with good position of tunneled drain catheter. Extensive pleural thickening and masses. Right lung clear. Upper Abdomen: Stable epigastric adenopathy. Stable probable cyst in left hepatic lobe. No acute findings. Musculoskeletal: Spondylitic changes in the visualized lower cervical spine. No fracture or worrisome bone lesion. IMPRESSION: 1. Progressive opacification of left hemithorax secondary to nodular pleural disease and large effusion. 2. The tunneled left pleural drain catheter appears appropriately positioned, suggesting the patient may benefit from more aggressive thoracentesis. 3. Persistent left supraclavicular, mediastinal, and epigastric adenopathy. Electronically Signed   By: Lucrezia Europe M.D.   On: 03/05/2018 14:58   Ct Angio Chest Pe W Or Wo Contrast  Result Date: 03/09/2018 CLINICAL DATA:  62 year old male with stage IV lymphoma. Recurrent malignant effusion with catheter placed Feb 20, 2018. Dizziness and shortness of breath. EXAM: CT ANGIOGRAPHY CHEST WITH CONTRAST TECHNIQUE: Multidetector CT imaging of the chest was performed using the standard protocol during bolus administration of intravenous contrast. Multiplanar CT image reconstructions and MIPs were obtained to evaluate the vascular anatomy. CONTRAST:  133mL ISOVUE-370 IOPAMIDOL  (ISOVUE-370) INJECTION 76% COMPARISON:  Chest CT Mar 05, 2018 FINDINGS: Cardiovascular: The heart mediastinum are shifted to the right due to a large left pleural effusion which will be described below. The thoracic aorta is normal in caliber with no atherosclerosis, aneurysm, or dissection. The heart size is normal and stable. No obvious coronary artery calcifications. Evaluation of pulmonary arterial branches on the right is limited due to significant atelectatic lung due to the large right pleural effusion. No pulmonary emboli identified. Mediastinum/Nodes: There is a large left pleural effusion filling nearly the entire left hemithorax resulting in significant atelectatic/collapsed lung on the left. Only minimal aerated lung remains on the left. The effusion results in shift of the heart mediastinum to the right. There is diffuse thickening of the right-sided pleura with pleural based masses remaining. The largest anterior pleural base mass on series 4, image 27 extends in the anterior chest wall, stable. No right-sided pleural effusion. No pericardial effusion. No right axillary adenopathy. Mildly enlarged nodes are seen in the base of the neck, particularly on the left. A 14 mm node is seen on image 5 in the base of the neck on the left. Left axillary and retropectoral adenopathy remains, similar in the interval. There is skin  thickening and edema associated with the left chest wall including the breast. This does not appear to be confined to the breast. The chest wall is otherwise unremarkable. Adenopathy remains in the mediastinum. The esophagus and thyroid are unremarkable. Lungs/Pleura: There is only minimal aerated lung on the left. The right lung is free of infiltrate, nodule, or mass. No pneumothorax. The central airways are unremarkable. Upper Abdomen: Mild adenopathy remains in the upper abdomen. Musculoskeletal: No chest wall abnormality. No acute or significant osseous findings. Review of the MIP  images confirms the above findings. IMPRESSION: 1. No pulmonary emboli identified. Evaluation of left-sided pulmonary arterial branches is limited due to near complete collapse of the left lung secondary to a large left pleural effusion. 2. There is a large left pleural effusion filling the left hemithorax resulting in significant collapse of the left lung. The pleura is thickened and there are multiple pleural-based masses, unchanged. The large left effusion shifts the heart and mediastinum to the right. 3. Adenopathy in the left axilla, base of neck, left greater than right, mediastinum, and upper abdomen consistent with the patient's known lymphoma. Electronically Signed   By: Dorise Bullion III M.D   On: 03/09/2018 08:50   Dg Chest Left Decubitus  Result Date: 03/02/2018 CLINICAL DATA:  History left pleural effusion and prior tunneled PleurX catheter placement. Possible enlarging loculated pleural effusion by chest x-ray. EXAM: CHEST - LEFT DECUBITUS COMPARISON:  Chest x-ray earlier today. FINDINGS: It is difficult to determine by a decubitus chest x-ray whether there is a significant component of pleural fluid. It does not appear that there is a large amount layering fluid, but there may be some layering component. Underlying atelectasis and/or consolidation of the lower lung is suspected. IMPRESSION: It is difficult to tell by decubitus chest x-ray whether there is a significant component of pleural fluid present. A CT of the chest would be needed to determine whether fluid is present and if so, its relationship to the existing tunneled PleurX drainage catheter. Electronically Signed   By: Aletta Edouard M.D.   On: 02/21/2018 20:41   Dg Chest Port 1 View  Result Date: 03/18/2018 CLINICAL DATA:  Left chest tube EXAM: PORTABLE CHEST 1 VIEW COMPARISON:  CT 03/14/2018 FINDINGS: Left PleurX catheter in place. Loculated left pleural effusion again noted, unchanged. Diffuse left lung airspace opacity,  likely compressive atelectasis. Right lung clear. Right Port-A-Cath is unchanged. Heart is normal size. IMPRESSION: Continued loculated left pleural effusion with left PleurX catheter in place. Opacities in the left lung likely reflect atelectasis. No real change since prior CT. Electronically Signed   By: Rolm Baptise M.D.   On: 03/18/2018 11:27    Microbiology: No results found for this or any previous visit (from the past 240 hour(s)).   Labs: CBC: Recent Labs  Lab 03/26/18 0434 03/27/18 0438  WBC 9.1 7.4  NEUTROABS  --  6.9  HGB 11.1* 10.9*  HCT 34.4* 33.6*  MCV 89.6 88.9  PLT 169 790   Basic Metabolic Panel: Recent Labs  Lab 03/25/18 0248 03/26/18 0434 03/26/18 0855 03/26/18 1515 03/26/18 2118 03/27/18 0438  NA 134* 138 137  --   --  140  K 6.0* 6.1* 5.8* 5.4* 5.5* 5.4*  CL 105 107 109  --   --  112*  CO2 18* 18* 19*  --   --  18*  GLUCOSE 213* 191* 201*  --   --  186*  BUN 42* 56* 56*  --   --  56*  CREATININE 1.32* 1.37* 1.25*  --   --  0.98  CALCIUM 9.1 9.2 8.6*  --   --  9.2  MG 2.3 2.4  --   --   --  2.5*   Liver Function Tests: Recent Labs  Lab 03/26/18 2118 03/27/18 0438  AST 27 15  ALT 17 16*  ALKPHOS 65 65  BILITOT 1.2 0.7  PROT 5.0* 5.0*  ALBUMIN 2.0* 1.9*   No results for input(s): LIPASE, AMYLASE in the last 168 hours. Recent Labs  Lab 03/26/18 2118  AMMONIA 25   Cardiac Enzymes: No results for input(s): CKTOTAL, CKMB, CKMBINDEX, TROPONINI in the last 168 hours. BNP (last 3 results) Recent Labs    01/27/18 1340 02/13/2018 1620  BNP 5.4 16.8   CBG: No results for input(s): GLUCAP in the last 168 hours. Time spent: 35 minutes  Signed:  Berle Mull  [facility]  2018/04/11  , 11:21 AM

## 2018-04-07 NOTE — Progress Notes (Signed)
RN drained pleurex drain this morning and only got 350cc off. Patient has been very restless today. VS are no longer within normal limits and MD aware. Social work Firefighter and MD know that patient had a bed at [facility] but we were waiting on family to fill out paper work and get in touch with them before we could move him. Patient has declined fast throughout shift. When patient family got to hospital they noticed how different patient was and wanted to speak with MD. At this time hospice had arrived and MD came to floor to have a private meeting with patient's family members in conference room about if they still wanted him to go to [facility] or stay in hospital with how patient was doing. Patient's family members decided they wanted him to stay in the hospital. MD put in orders for morphine drip and other PRN orders to help with the decline of patient to keep him comfortable during this process. RN has ordered comfort cart for family and will continue to monitor patient and keep him comfortable along with comforting the family during this difficult time.  Carmela Hurt, RN

## 2018-04-07 NOTE — Progress Notes (Signed)
[facility]) @ 1430 RN Note  Met with daughter Altha Harm and pt's sister to discuss transfer to [facility]. When we arrived in the room, pt appeared to be actively dying. Dr. Posey Pronto entered and discussed being able to escalate comfort measures for patient as transferring patient at this time would not be in his best interest. Phone call via speakerl with Earl Gala HCPOA/pt's sister, Altha Harm, daughter and pt's sister who was present in the room.  Decision was made to escalate comfort measures here in the hospital and cancel [facility] Admission.  Should [facility] be able to be of services at any time. Please feel free to call.  Thank you, Margaretmary Eddy, [place] [phone]  [place] are on AMION.

## 2018-04-07 NOTE — Progress Notes (Addendum)
Pt has been referred to [facility] and pt's family meeting with liaison this afternoon to complete admission paperwork. Will arrange transportation via [facility] once pt ready for DC.  Sharren Bridge, MSW, LCSW Clinical Social Work 04/10/2018 [phone] weekend coverage for [phone]  CSW informed pt is not stable to transfer at this time. Anticipate hospital death. CSW will continue to be available for needs.

## 2018-04-07 NOTE — Progress Notes (Signed)
Patient passed at 1655; Levin Bacon was the second RN that witnessed time of death. We wasted 7mL of IV Morphine in sink from continuous IV bag that was hanging. Family was present at bedside during the patient's passing. RN and comfort cart is with patient's family right now. MD made aware and coming to sign death certificate. Will continue to support family with anything they need during this time.  Carmela Hurt, RN

## 2018-04-07 NOTE — Death Summary Note (Signed)
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 Patient was given IV antibiotics.  IR was consulted for Pleurx catheter placement as well as radiation oncology was consulted for elective radiation. Despite aggressive treatment the patient's pleural effusion did not improve.  Patient also had significant poor nutrition and functional status with worsening pain control.  Palliative care was consulted for goals of care discussion was done with the family.  After prolonged discussion family decided to transition to complete comfort.  Initial plan was discharged to [facility] but patient's condition worsened severely and therefore discharge was canceled.  The patient was pronounced deceased at 16:55, on April 06, 2018 .   The results of significant diagnostics from this hospitalization (including imaging, microbiology, ancillary and laboratory) are listed below for reference.    Significant Diagnostic Studies: Dg Chest 2 View  Result Date: 03/11/2018 CLINICAL DATA:  History of lung carcinoma with shortness of Breath EXAM: CHEST - 2 VIEW COMPARISON:  02/20/2018 PleurX, 02/14/2018 FINDINGS: Cardiac shadow is obscured by a significantly enlarged left-sided pleural effusion. A left PleurX catheter is noted in place. Minimal left lung  aeration is noted. Right chest wall port is seen in satisfactory position. Right lung is clear. No bony abnormality is noted. IMPRESSION: Enlarging left-sided pleural effusion. Electronically Signed   By: Inez Catalina M.D.   On: 03-11-2018 17:00   Ct Head Wo Contrast  Result Date: 03/25/2018 CLINICAL DATA:  Altered level of consciousness. History of mantle cell lymphoma. EXAM: CT HEAD WITHOUT CONTRAST TECHNIQUE: Contiguous axial images were obtained from the base of the skull through the vertex without intravenous contrast. COMPARISON:  None. FINDINGS: Brain: No evidence of acute infarction, hemorrhage, hydrocephalus, extra-axial collection or mass lesion/mass effect. Vascular: No hyperdense vessel or unexpected calcification. Skull: Normal. Negative for fracture or focal lesion. Sinuses/Orbits: No acute finding. Other: None. IMPRESSION: Normal head CT. Electronically Signed   By: Marijo Conception, M.D.   On: 03/25/2018 14:33   Ct Chest Wo Contrast  Result Date: 03/14/2018 CLINICAL DATA:  Respiratory illness. 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 EXAM: CT CHEST WITHOUT CONTRAST TECHNIQUE: Multidetector CT imaging of the chest was performed following the standard protocol without IV contrast. COMPARISON:  Chest CT angiogram dated 03/09/2018. chest CT dated 03/05/2018. Chest CT dated 01/28/2018. FINDINGS: Cardiovascular: Heart size is normal. Heart is displaced to the RIGHT. No pericardial effusion seen. No thoracic aortic aneurysm seen. Mediastinum/Nodes: There is extensive mediastinal lymphadenopathy, not significantly changed compared to previous CTs of 03/09/2018 and 03/05/2018, but significantly progressed compared to chest CT of 01/28/2018 Lungs/Pleura: Loculated appearing LEFT pleural effusion is decreased in size compared to most recent chest CT of 03/09/2018, chest tube in place with tip directed  towards the lateral aspects of the  LEFT upper lung. Multifocal masslike nodularity along the pleura throughout the LEFT chest. Most prominent component of this pleural masslike nodularity is again seen at the anterior aspects of the LEFT upper lung, measuring approximately 6.3 cm transverse dimension and 6.2 cm AP dimension (series 2, images 44 through 48), measuring only 2.5 cm on chest CT of 01/28/2018, also slightly increased in size compared to the most recent chest CT of 03/09/2018 where it measured approximately 5.8 x 5.7 cm. The mass effect on the mediastinum, with rightward mediastinal shift, is not significantly changed compared to the most recent chest CT of 03/09/2018. Small peripheral ground-glass opacity within the lateral aspects of the RIGHT upper lobe, pneumonia versus edema. RIGHT lung otherwise clear. No pneumothorax seen. Upper Abdomen: Characterization of the upper abdomen is limited by patient motion artifact. Musculoskeletal: No acute or suspicious osseous finding. Additional prominent lymph nodes in the LEFT axilla, significantly increased in size compared to previous chest CT of 01/28/2018, largest now with short axis measurement of approximately 2.2 cm (previously 1 cm). IMPRESSION: 1. Significant progression of disease compared to chest CT of 01/28/2018, and least mild progression of disease compared to the more recent chest CT of 03/09/2018, as detailed above. 2. LEFT pleural effusion, moderate to large in size, likely multiloculated, with chest tube in place, slightly decreased in size compared to most recent chest CT of 03/09/2018. 3. The mass effect on the mediastinum, with associated rightward mediastinal shift, is not significantly changed compared to the most recent chest CT. 4. Small peripheral ground-glass opacity within the lateral aspects of the RIGHT upper lobe, edema versus early pneumonia, favor edema or atelectasis. RIGHT lung is otherwise clear. 5. Enlarged lymph nodes in the LEFT axilla, increased in size  compared to previous exams, again compatible with progression of disease. Electronically Signed   By: Franki Cabot M.D.   On: 03/14/2018 14:02   Ct Chest Wo Contrast  Result Date: 03/05/2018 CLINICAL DATA:  Shortness of breath, cough. EXAM: CT CHEST WITHOUT CONTRAST TECHNIQUE: Multidetector CT imaging of the chest was performed following the standard protocol without IV contrast. COMPARISON:  PET-CT 02/05/2018 and previous FINDINGS: Cardiovascular: Right IJ port catheter to the distal SVC. Heart size normal. No pericardial effusion. Mediastinum/Nodes: Left supraclavicular, anterior mediastinal, right paratracheal, and subcarinal adenopathy as before. There is mild rightward mediastinal shift secondary to left pleural disease. Anterior left pleural mass is probably associated with mediastinal invasion. Lungs/Pleura: Progressive, near complete opacification of the left lung. Large left pleural effusion with good position of tunneled drain catheter. Extensive pleural thickening and masses. Right lung clear. Upper Abdomen: Stable epigastric adenopathy. Stable probable cyst in left hepatic lobe. No acute findings. Musculoskeletal: Spondylitic changes in the visualized lower cervical spine. No fracture or worrisome bone lesion. IMPRESSION: 1. Progressive opacification of left hemithorax secondary to nodular pleural disease and large effusion. 2. The tunneled left pleural drain catheter appears appropriately positioned, suggesting the patient may benefit from more aggressive thoracentesis. 3. Persistent left supraclavicular, mediastinal, and epigastric adenopathy. Electronically Signed   By: Lucrezia Europe M.D.   On: 03/05/2018 14:58   Ct Angio Chest Pe W Or Wo Contrast  Result Date: 03/09/2018 CLINICAL DATA:  62 year old male with stage IV lymphoma. Recurrent malignant effusion with catheter placed Feb 20, 2018. Dizziness and shortness of breath. EXAM: CT ANGIOGRAPHY CHEST WITH CONTRAST TECHNIQUE: Multidetector CT  imaging of the chest was performed using the standard protocol during bolus administration of intravenous contrast.  Multiplanar CT image reconstructions and MIPs were obtained to evaluate the vascular anatomy. CONTRAST:  151mL ISOVUE-370 IOPAMIDOL (ISOVUE-370) INJECTION 76% COMPARISON:  Chest CT Mar 05, 2018 FINDINGS: Cardiovascular: The heart mediastinum are shifted to the right due to a large left pleural effusion which will be described below. The thoracic aorta is normal in caliber with no atherosclerosis, aneurysm, or dissection. The heart size is normal and stable. No obvious coronary artery calcifications. Evaluation of pulmonary arterial branches on the right is limited due to significant atelectatic lung due to the large right pleural effusion. No pulmonary emboli identified. Mediastinum/Nodes: There is a large left pleural effusion filling nearly the entire left hemithorax resulting in significant atelectatic/collapsed lung on the left. Only minimal aerated lung remains on the left. The effusion results in shift of the heart mediastinum to the right. There is diffuse thickening of the right-sided pleura with pleural based masses remaining. The largest anterior pleural base mass on series 4, image 27 extends in the anterior chest wall, stable. No right-sided pleural effusion. No pericardial effusion. No right axillary adenopathy. Mildly enlarged nodes are seen in the base of the neck, particularly on the left. A 14 mm node is seen on image 5 in the base of the neck on the left. Left axillary and retropectoral adenopathy remains, similar in the interval. There is skin thickening and edema associated with the left chest wall including the breast. This does not appear to be confined to the breast. The chest wall is otherwise unremarkable. Adenopathy remains in the mediastinum. The esophagus and thyroid are unremarkable. Lungs/Pleura: There is only minimal aerated lung on the left. The right lung is free of  infiltrate, nodule, or mass. No pneumothorax. The central airways are unremarkable. Upper Abdomen: Mild adenopathy remains in the upper abdomen. Musculoskeletal: No chest wall abnormality. No acute or significant osseous findings. Review of the MIP images confirms the above findings. IMPRESSION: 1. No pulmonary emboli identified. Evaluation of left-sided pulmonary arterial branches is limited due to near complete collapse of the left lung secondary to a large left pleural effusion. 2. There is a large left pleural effusion filling the left hemithorax resulting in significant collapse of the left lung. The pleura is thickened and there are multiple pleural-based masses, unchanged. The large left effusion shifts the heart and mediastinum to the right. 3. Adenopathy in the left axilla, base of neck, left greater than right, mediastinum, and upper abdomen consistent with the patient's known lymphoma. Electronically Signed   By: Dorise Bullion III M.D   On: 03/09/2018 08:50   Dg Chest Left Decubitus  Result Date: 02/26/2018 CLINICAL DATA:  History left pleural effusion and prior tunneled PleurX catheter placement. Possible enlarging loculated pleural effusion by chest x-ray. EXAM: CHEST - LEFT DECUBITUS COMPARISON:  Chest x-ray earlier today. FINDINGS: It is difficult to determine by a decubitus chest x-ray whether there is a significant component of pleural fluid. It does not appear that there is a large amount layering fluid, but there may be some layering component. Underlying atelectasis and/or consolidation of the lower lung is suspected. IMPRESSION: It is difficult to tell by decubitus chest x-ray whether there is a significant component of pleural fluid present. A CT of the chest would be needed to determine whether fluid is present and if so, its relationship to the existing tunneled PleurX drainage catheter. Electronically Signed   By: Aletta Edouard M.D.   On: 02/14/2018 20:41   Dg Chest Port 1  View  Result Date:  03/18/2018 CLINICAL DATA:  Left chest tube EXAM: PORTABLE CHEST 1 VIEW COMPARISON:  CT 03/14/2018 FINDINGS: Left PleurX catheter in place. Loculated left pleural effusion again noted, unchanged. Diffuse left lung airspace opacity, likely compressive atelectasis. Right lung clear. Right Port-A-Cath is unchanged. Heart is normal size. IMPRESSION: Continued loculated left pleural effusion with left PleurX catheter in place. Opacities in the left lung likely reflect atelectasis. No real change since prior CT. Electronically Signed   By: Rolm Baptise M.D.   On: 03/18/2018 11:27    Microbiology: No results found for this or any previous visit (from the past 240 hour(s)).   Labs: CBC: Recent Labs  Lab 03/26/18 0434 03/27/18 0438  WBC 9.1 7.4  NEUTROABS  --  6.9  HGB 11.1* 10.9*  HCT 34.4* 33.6*  MCV 89.6 88.9  PLT 169 440   Basic Metabolic Panel: Recent Labs  Lab 03/25/18 0248 03/26/18 0434 03/26/18 0855 03/26/18 1515 03/26/18 2118 03/27/18 0438  NA 134* 138 137  --   --  140  K 6.0* 6.1* 5.8* 5.4* 5.5* 5.4*  CL 105 107 109  --   --  112*  CO2 18* 18* 19*  --   --  18*  GLUCOSE 213* 191* 201*  --   --  186*  BUN 42* 56* 56*  --   --  56*  CREATININE 1.32* 1.37* 1.25*  --   --  0.98  CALCIUM 9.1 9.2 8.6*  --   --  9.2  MG 2.3 2.4  --   --   --  2.5*   Liver Function Tests: Recent Labs  Lab 03/26/18 2118 03/27/18 0438  AST 27 15  ALT 17 16*  ALKPHOS 65 65  BILITOT 1.2 0.7  PROT 5.0* 5.0*  ALBUMIN 2.0* 1.9*   No results for input(s): LIPASE, AMYLASE in the last 168 hours. Recent Labs  Lab 03/26/18 2118  AMMONIA 25   Cardiac Enzymes: No results for input(s): CKTOTAL, CKMB, CKMBINDEX, TROPONINI in the last 168 hours. BNP (last 3 results) Recent Labs    01/27/18 1340 02/20/2018 1620  BNP 5.4 16.8   CBG: No results for input(s): GLUCAP in the last 168 hours. Time spent: 15 minutes  Signed:  Berle Mull  [facility] 04/05/18, 5:37  PM

## 2018-04-07 DEATH — deceased

## 2018-04-28 ENCOUNTER — Ambulatory Visit: Payer: Self-pay | Admitting: Radiation Oncology

## 2018-05-21 ENCOUNTER — Ambulatory Visit: Payer: Medicare Other | Admitting: Internal Medicine

## 2018-05-21 ENCOUNTER — Ambulatory Visit: Payer: Medicare Other

## 2019-02-20 ENCOUNTER — Encounter

## 2019-05-04 IMAGING — XA IR ABSCESS DRAINAGE
1 series · 1 of 1 positions shown · non-contrast
Comparison: none

INDICATION: 62-year-old male with a history of recurrent malignant left-sided
pleural effusion.

[Series 300: ir perc pleural drain w/indwell cath w/i · 1 of 1 slices shown]
[im 1/1]
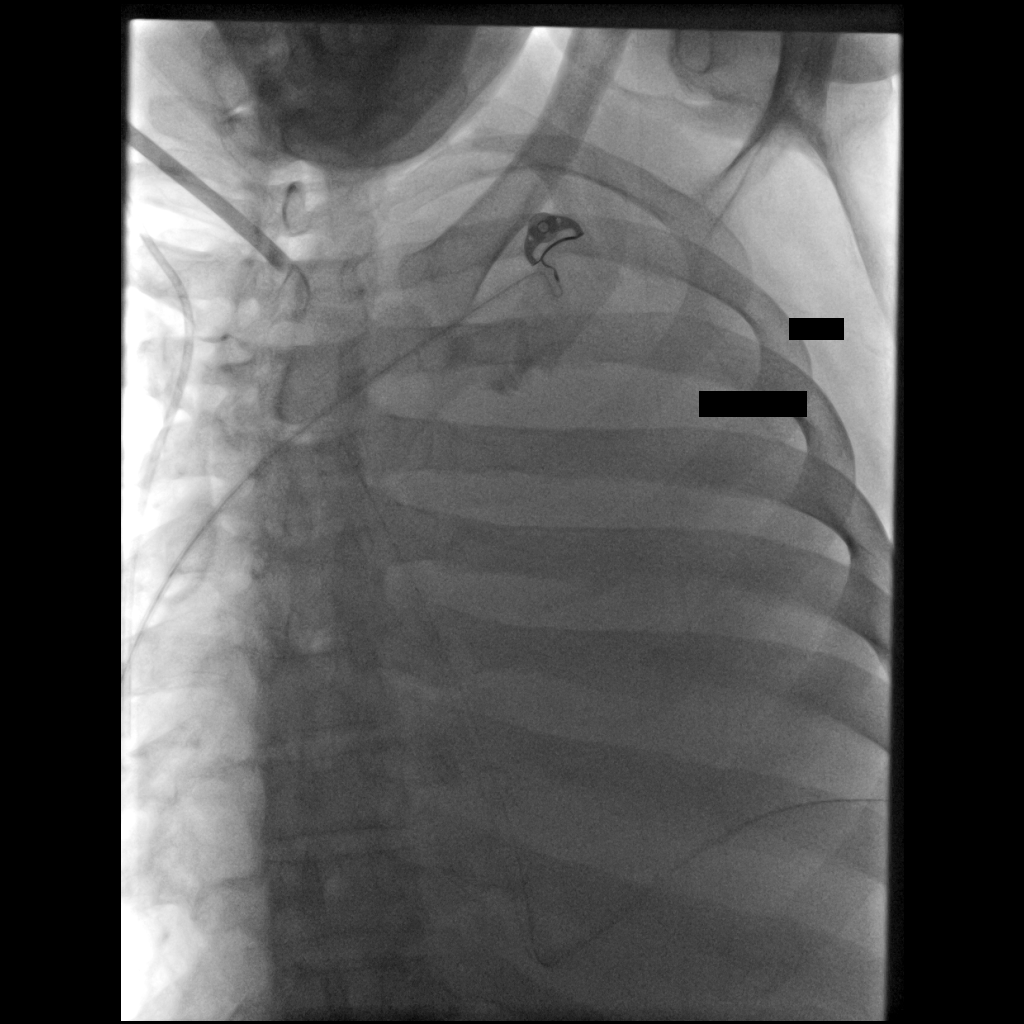

[1 of 1 positions shown; findings below may reference images not displayed]

EXAM:
IMAGE GUIDED TUNNELED PLEURAL CATHETER

MEDICATIONS:
2.0 g Ancef. The antibiotics were administered within an appropriate
time frame prior to the initiation of the procedure.

ANESTHESIA/SEDATION:
Fentanyl 100 mcg IV; Versed 2.0 mg IV

Moderate Sedation Time:  13 minutes

The patient was continuously monitored during the procedure by the
interventional radiology nurse under my direct supervision.

COMPLICATIONS:
None

PROCEDURE:
The procedure, risks, benefits, and alternatives were explained to
the patient and the patient's family. Specific risks that were
addressed included bleeding, infection, pneumothorax, need for
further procedure, chance of delayed pneumothorax or hemorrhage,
hemoptysis, cardiopulmonary collapse, death. Questions regarding the
procedure were encouraged and answered. The patient understands and
consents to the procedure.

The left chest wall was prepped with Betadine in a sterile fashion,
and a sterile drape was applied covering the operative field. A
sterile gown and sterile gloves were used for the procedure. Local
anesthesia was provided with 1% Lidocaine. Ultrasound image
documentation was performed.

After creating a small skin incision, a 19 gauge needle was advanced
into the pleural cavity under ultrasound guidance. A guide wire was
then advanced under fluoroscopy into the pleural space. Pleural
access was dilated serially and a 16-French peel-away sheath placed.

The skin and subcutaneous tissues were generously infiltrated with
1% lidocaine from the puncture site over the pleura along the
intercostal margin anteriorly. A small stab incision was made with
11 blade scalpel at the insertion site of the catheter, and the
catheter was back tunneled to the site at the pleural puncture.

A tunneled CareFusion Pleurex catheter was placed. This was tunneled
from the incision 5 cm anterior to the pleural access to the access
site. The catheter was advanced through the peel-away sheath. The
sheath was then removed. Final catheter positioning was confirmed
with a fluoroscopic spot image.

The access incision was closed with Dermabond, applied to the
catheterization incision. Large volume thoracentesis was performed
through the new catheter utilizing gravity drainage bag.

The patient tolerated the procedure well and remained
hemodynamically stable throughout.

No complications were encountered and no significant blood loss was
encountered.
IMPRESSION: Status post left-sided tunneled pleural catheter.
